# Patient Record
Sex: Female | Born: 1965
Health system: Southern US, Community
[De-identification: ages and names within clinical notes are randomized; demographics above are authoritative.]

## PROBLEM LIST (undated history)

## (undated) DIAGNOSIS — F329 Major depressive disorder, single episode, unspecified: Secondary | ICD-10-CM

## (undated) DIAGNOSIS — Z95810 Presence of automatic (implantable) cardiac defibrillator: Secondary | ICD-10-CM

## (undated) DIAGNOSIS — E669 Obesity, unspecified: Secondary | ICD-10-CM

## (undated) DIAGNOSIS — J302 Other seasonal allergic rhinitis: Secondary | ICD-10-CM

## (undated) DIAGNOSIS — F32A Depression, unspecified: Secondary | ICD-10-CM

## (undated) DIAGNOSIS — E049 Nontoxic goiter, unspecified: Secondary | ICD-10-CM

## (undated) DIAGNOSIS — M545 Low back pain, unspecified: Secondary | ICD-10-CM

## (undated) DIAGNOSIS — I509 Heart failure, unspecified: Secondary | ICD-10-CM

## (undated) DIAGNOSIS — F419 Anxiety disorder, unspecified: Secondary | ICD-10-CM

## (undated) DIAGNOSIS — R0602 Shortness of breath: Secondary | ICD-10-CM

## (undated) DIAGNOSIS — K219 Gastro-esophageal reflux disease without esophagitis: Secondary | ICD-10-CM

## (undated) DIAGNOSIS — K589 Irritable bowel syndrome without diarrhea: Secondary | ICD-10-CM

## (undated) DIAGNOSIS — G4730 Sleep apnea, unspecified: Secondary | ICD-10-CM

## (undated) DIAGNOSIS — I10 Essential (primary) hypertension: Secondary | ICD-10-CM

## (undated) DIAGNOSIS — R011 Cardiac murmur, unspecified: Secondary | ICD-10-CM

## (undated) DIAGNOSIS — A6009 Herpesviral infection of other urogenital tract: Secondary | ICD-10-CM

## (undated) HISTORY — PX: DILATION AND CURETTAGE OF UTERUS: SHX78

## (undated) HISTORY — PX: DIAGNOSTIC LAPAROSCOPY: SUR761

## (undated) HISTORY — PX: WISDOM TOOTH EXTRACTION: SHX21

## (undated) HISTORY — PX: CARDIAC CATHETERIZATION: SHX172

## (undated) HISTORY — PX: NOVASURE ABLATION: SHX5394

## (undated) HISTORY — DX: Herpesviral infection of other urogenital tract: A60.09

## (undated) HISTORY — PX: OTHER SURGICAL HISTORY: SHX169

## (undated) HISTORY — PX: TUBAL LIGATION: SHX77

## (undated) HISTORY — PX: ABDOMINAL HYSTERECTOMY: SHX81

---

## 2001-09-23 ENCOUNTER — Encounter: Payer: Self-pay | Admitting: Emergency Medicine

## 2001-09-23 ENCOUNTER — Emergency Department (HOSPITAL_COMMUNITY): Admission: EM | Admit: 2001-09-23 | Discharge: 2001-09-24 | Payer: Self-pay | Admitting: Emergency Medicine

## 2001-09-24 ENCOUNTER — Encounter: Payer: Self-pay | Admitting: Emergency Medicine

## 2001-10-08 ENCOUNTER — Emergency Department (HOSPITAL_COMMUNITY): Admission: EM | Admit: 2001-10-08 | Discharge: 2001-10-08 | Payer: Self-pay | Admitting: Emergency Medicine

## 2001-12-26 ENCOUNTER — Other Ambulatory Visit: Admission: RE | Admit: 2001-12-26 | Discharge: 2001-12-26 | Payer: Self-pay | Admitting: Family Medicine

## 2002-01-01 ENCOUNTER — Ambulatory Visit (HOSPITAL_COMMUNITY): Admission: RE | Admit: 2002-01-01 | Discharge: 2002-01-01 | Payer: Self-pay | Admitting: Family Medicine

## 2002-07-24 HISTORY — PX: OTHER SURGICAL HISTORY: SHX169

## 2002-09-27 ENCOUNTER — Encounter: Payer: Self-pay | Admitting: Emergency Medicine

## 2002-09-27 ENCOUNTER — Emergency Department (HOSPITAL_COMMUNITY): Admission: EM | Admit: 2002-09-27 | Discharge: 2002-09-27 | Payer: Self-pay | Admitting: Emergency Medicine

## 2002-09-27 ENCOUNTER — Inpatient Hospital Stay (HOSPITAL_COMMUNITY): Admission: AD | Admit: 2002-09-27 | Discharge: 2002-09-28 | Payer: Self-pay | Admitting: Neurology

## 2002-09-28 ENCOUNTER — Encounter: Payer: Self-pay | Admitting: Neurology

## 2003-08-26 ENCOUNTER — Emergency Department (HOSPITAL_COMMUNITY): Admission: AD | Admit: 2003-08-26 | Discharge: 2003-08-26 | Payer: Self-pay | Admitting: Family Medicine

## 2003-10-08 ENCOUNTER — Other Ambulatory Visit: Admission: RE | Admit: 2003-10-08 | Discharge: 2003-10-08 | Payer: Self-pay | Admitting: Family Medicine

## 2004-03-30 ENCOUNTER — Ambulatory Visit: Payer: Self-pay | Admitting: Nurse Practitioner

## 2004-04-01 ENCOUNTER — Ambulatory Visit (HOSPITAL_COMMUNITY): Admission: RE | Admit: 2004-04-01 | Discharge: 2004-04-01 | Payer: Self-pay | Admitting: Internal Medicine

## 2004-04-12 ENCOUNTER — Emergency Department (HOSPITAL_COMMUNITY): Admission: EM | Admit: 2004-04-12 | Discharge: 2004-04-12 | Payer: Self-pay | Admitting: Emergency Medicine

## 2004-11-23 ENCOUNTER — Emergency Department (HOSPITAL_COMMUNITY): Admission: EM | Admit: 2004-11-23 | Discharge: 2004-11-23 | Payer: Self-pay | Admitting: Emergency Medicine

## 2005-09-13 ENCOUNTER — Ambulatory Visit: Payer: Self-pay | Admitting: Nurse Practitioner

## 2005-10-06 ENCOUNTER — Other Ambulatory Visit: Admission: RE | Admit: 2005-10-06 | Discharge: 2005-10-06 | Payer: Self-pay | Admitting: Obstetrics and Gynecology

## 2005-10-31 ENCOUNTER — Encounter (INDEPENDENT_AMBULATORY_CARE_PROVIDER_SITE_OTHER): Payer: Self-pay | Admitting: *Deleted

## 2005-10-31 ENCOUNTER — Ambulatory Visit (HOSPITAL_COMMUNITY): Admission: RE | Admit: 2005-10-31 | Discharge: 2005-10-31 | Payer: Self-pay | Admitting: Obstetrics and Gynecology

## 2005-11-26 ENCOUNTER — Encounter: Admission: RE | Admit: 2005-11-26 | Discharge: 2005-11-26 | Payer: Self-pay | Admitting: Internal Medicine

## 2005-12-11 ENCOUNTER — Encounter: Admission: RE | Admit: 2005-12-11 | Discharge: 2006-03-11 | Payer: Self-pay | Admitting: Obstetrics and Gynecology

## 2006-10-17 ENCOUNTER — Inpatient Hospital Stay (HOSPITAL_COMMUNITY): Admission: AD | Admit: 2006-10-17 | Discharge: 2006-10-18 | Payer: Self-pay | Admitting: Obstetrics and Gynecology

## 2006-11-19 ENCOUNTER — Emergency Department (HOSPITAL_COMMUNITY): Admission: EM | Admit: 2006-11-19 | Discharge: 2006-11-19 | Payer: Self-pay | Admitting: Family Medicine

## 2006-11-27 ENCOUNTER — Ambulatory Visit (HOSPITAL_COMMUNITY): Admission: RE | Admit: 2006-11-27 | Discharge: 2006-11-27 | Payer: Self-pay | Admitting: Gastroenterology

## 2007-01-08 ENCOUNTER — Ambulatory Visit (HOSPITAL_COMMUNITY): Admission: RE | Admit: 2007-01-08 | Discharge: 2007-01-08 | Payer: Self-pay | Admitting: Obstetrics and Gynecology

## 2007-01-08 ENCOUNTER — Encounter (INDEPENDENT_AMBULATORY_CARE_PROVIDER_SITE_OTHER): Payer: Self-pay | Admitting: Obstetrics and Gynecology

## 2007-01-30 ENCOUNTER — Emergency Department (HOSPITAL_COMMUNITY): Admission: EM | Admit: 2007-01-30 | Discharge: 2007-01-31 | Payer: Self-pay | Admitting: Emergency Medicine

## 2008-03-02 ENCOUNTER — Encounter: Admission: RE | Admit: 2008-03-02 | Discharge: 2008-03-02 | Payer: Self-pay | Admitting: Internal Medicine

## 2008-03-10 ENCOUNTER — Ambulatory Visit (HOSPITAL_COMMUNITY): Admission: RE | Admit: 2008-03-10 | Discharge: 2008-03-10 | Payer: Self-pay | Admitting: Obstetrics and Gynecology

## 2008-03-25 ENCOUNTER — Encounter: Payer: Self-pay | Admitting: Internal Medicine

## 2008-09-02 ENCOUNTER — Encounter: Admission: RE | Admit: 2008-09-02 | Discharge: 2008-09-02 | Payer: Self-pay | Admitting: Internal Medicine

## 2009-08-02 ENCOUNTER — Ambulatory Visit (HOSPITAL_COMMUNITY): Admission: RE | Admit: 2009-08-02 | Discharge: 2009-08-02 | Payer: Self-pay | Admitting: Internal Medicine

## 2009-11-29 ENCOUNTER — Ambulatory Visit (HOSPITAL_COMMUNITY): Admission: RE | Admit: 2009-11-29 | Discharge: 2009-11-29 | Payer: Self-pay | Admitting: Obstetrics and Gynecology

## 2009-12-16 ENCOUNTER — Ambulatory Visit (HOSPITAL_COMMUNITY): Admission: RE | Admit: 2009-12-16 | Discharge: 2009-12-16 | Payer: Self-pay | Admitting: Internal Medicine

## 2010-01-31 ENCOUNTER — Encounter: Admission: RE | Admit: 2010-01-31 | Discharge: 2010-04-22 | Payer: Self-pay | Admitting: Obstetrics and Gynecology

## 2010-02-01 ENCOUNTER — Observation Stay (HOSPITAL_COMMUNITY): Admission: EM | Admit: 2010-02-01 | Discharge: 2010-02-03 | Payer: Self-pay | Admitting: Internal Medicine

## 2010-02-01 ENCOUNTER — Encounter: Payer: Self-pay | Admitting: Emergency Medicine

## 2010-04-19 ENCOUNTER — Ambulatory Visit (HOSPITAL_BASED_OUTPATIENT_CLINIC_OR_DEPARTMENT_OTHER): Admission: RE | Admit: 2010-04-19 | Discharge: 2010-04-19 | Payer: Self-pay | Admitting: Obstetrics and Gynecology

## 2010-04-19 ENCOUNTER — Ambulatory Visit: Payer: Self-pay | Admitting: Diagnostic Radiology

## 2010-07-28 ENCOUNTER — Emergency Department (HOSPITAL_COMMUNITY)
Admission: EM | Admit: 2010-07-28 | Discharge: 2010-07-28 | Payer: Self-pay | Source: Home / Self Care | Admitting: Emergency Medicine

## 2010-08-14 ENCOUNTER — Encounter: Payer: Self-pay | Admitting: Obstetrics and Gynecology

## 2010-10-09 LAB — PROTIME-INR: INR: 1.04 (ref 0.00–1.49)

## 2010-10-09 LAB — CARDIAC PANEL(CRET KIN+CKTOT+MB+TROPI)
CK, MB: 1.6 ng/mL (ref 0.3–4.0)
CK, MB: 1.7 ng/mL (ref 0.3–4.0)
Relative Index: 1.7 (ref 0.0–2.5)
Total CK: 97 U/L (ref 7–177)
Troponin I: 0.02 ng/mL (ref 0.00–0.06)

## 2010-10-09 LAB — CBC
HCT: 38.3 % (ref 36.0–46.0)
Hemoglobin: 13.5 g/dL (ref 12.0–15.0)
MCH: 28.6 pg (ref 26.0–34.0)
MCH: 28.7 pg (ref 26.0–34.0)
MCHC: 34.2 g/dL (ref 30.0–36.0)
MCV: 83.5 fL (ref 78.0–100.0)
RBC: 4.48 MIL/uL (ref 3.87–5.11)
RDW: 12.6 % (ref 11.5–15.5)

## 2010-10-09 LAB — DIFFERENTIAL
Basophils Relative: 1 % (ref 0–1)
Lymphocytes Relative: 46 % (ref 12–46)
Lymphs Abs: 3.9 10*3/uL (ref 0.7–4.0)
Monocytes Relative: 5 % (ref 3–12)
Neutrophils Relative %: 48 % (ref 43–77)

## 2010-10-09 LAB — COMPREHENSIVE METABOLIC PANEL
ALT: 16 U/L (ref 0–35)
Alkaline Phosphatase: 78 U/L (ref 39–117)
CO2: 31 mEq/L (ref 19–32)
Chloride: 102 mEq/L (ref 96–112)
GFR calc Af Amer: >60 mL/min (ref >60)
Total Bilirubin: 0.6 mg/dL (ref 0.3–1.2)
Total Protein: 6.3 g/dL (ref 6.0–8.3)

## 2010-10-09 LAB — SEDIMENTATION RATE: Sed Rate: 5 mm/hr (ref 0–22)

## 2010-10-09 LAB — POCT CARDIAC MARKERS
Myoglobin, poc: 62 ng/mL (ref 12–200)
Troponin i, poc: <0.05 ng/mL (ref 0.00–0.09)

## 2010-10-09 LAB — BASIC METABOLIC PANEL
BUN: 12 mg/dL (ref 6–23)
Calcium: 10.1 mg/dL (ref 8.4–10.5)
Creatinine, Ser: 0.49 mg/dL (ref 0.4–1.2)
Sodium: 136 mEq/L (ref 135–145)

## 2010-10-09 LAB — BRAIN NATRIURETIC PEPTIDE: Pro B Natriuretic peptide (BNP): 86 pg/mL (ref 0.0–100.0)

## 2010-11-08 ENCOUNTER — Other Ambulatory Visit: Payer: Self-pay | Admitting: Obstetrics and Gynecology

## 2010-11-08 DIAGNOSIS — Z1231 Encounter for screening mammogram for malignant neoplasm of breast: Secondary | ICD-10-CM

## 2010-12-01 ENCOUNTER — Ambulatory Visit (HOSPITAL_COMMUNITY)
Admission: RE | Admit: 2010-12-01 | Discharge: 2010-12-01 | Disposition: A | Discharge status: Home / Self Care | Payer: 59 | Source: Ambulatory Visit | Attending: Obstetrics and Gynecology | Admitting: Obstetrics and Gynecology

## 2010-12-01 DIAGNOSIS — Z1231 Encounter for screening mammogram for malignant neoplasm of breast: Secondary | ICD-10-CM | POA: Insufficient documentation

## 2010-12-06 NOTE — Op Note (Signed)
NAME:  Susan Peck, BEDNARZ              ACCOUNT NO.:  [id number]   MEDICAL RECORD NO.:  [id number]          PATIENT TYPE:  AMB   LOCATION:  SDC                           FACILITY:  [facility]   PHYSICIAN:  Osborn Coho, M.D.   DATE OF BIRTH:  08-Oct-1965   DATE OF PROCEDURE:  01/08/2007  DATE OF DISCHARGE:                               OPERATIVE REPORT   PREOPERATIVE DIAGNOSES:  1. Pelvic pain.  2. Fibroids.  3. Questionable cervical stenosis.  4. Status post endometrial ablation.  5. Metorrhagia.  6. Dysmenorrhea.   POSTOPERATIVE DIAGNOSES:  1. Pelvic pain.  2. Fibroids.  3. Questionable cervical stenosis.  4. Status post endometrial ablation.  5. Metorrhagia.  6. Dysmenorrhea.   PROCEDURE:  1. Hysteroscopy.  2. Dilatation and curettage.   SURGEON:  Osborn Coho, M.D.   ANESTHESIA:  General via LMA.   FLUIDS:  1100 cc.   URINE OUTPUT:  Approximately 500 cc via straight cath prior to  procedure.  A hysteroscopic fluid deficit of sorbitol 100 cc.   COMPLICATIONS:  None.   ESTIMATED BLOOD LOSS:  Minimal.   FINDINGS:  Senechia in cavity that had the appearance of a uterine  septum near the top of the fundus.  Neither os was able to be  identified.   PROCEDURE:  Patient was taken to the operating room after the risks,  benefits and alternatives were reviewed with patient.  Patient  verbalized understanding, and consent signed and witnessed.  The patient  was placed under general anesthesia and prepped and draped in a normal  sterile fashion.  A bivalve speculum was placed into the patient's  vagina, and the anterior lip of the cervix was grasped with a single-  tooth tenaculum after a paracervical block was administered using a  total of 10 cc of 1% lidocaine.  The internal os was noted to be  stenotic, which was dilated.  The uterus sounded to 7 cm.  The  hysteroscope was introduced, and findings as noted above.  Curretage was  performed, and no real curettings obtained;  however, whatever was  obtained was sent to pathology.  The uterus sounded to approximately 8  cm at the end of the case.  Tenaculum was removed, and there was good  hemostasis at the tenaculum site.  Sponge, lap, and needle count was  correct.  The patient tolerated the procedure well and is awaiting  transfer to the recovery room in good condition.      Osborn Coho, M.D.  Electronically Signed     AR/MEDQ  D:  01/08/2007  T:  01/08/2007  Job:  [id number]

## 2010-12-09 NOTE — H&P (Signed)
NAME:  Susan Peck, Susan Peck                         ACCOUNT NO.:  [id number]   MEDICAL RECORD NO.:  [id number]                   PATIENT TYPE:  INP   LOCATION:  3018                                 FACILITY:  [facility]   PHYSICIAN:  Santina Evans A. Orlin Hilding, M.D.          DATE OF BIRTH:  May 07, 1966   DATE OF ADMISSION:  09/27/2002  DATE OF DISCHARGE:                                HISTORY & PHYSICAL   CHIEF COMPLAINT:  Left-sided numbness.   HISTORY OF PRESENT ILLNESS:  The patient is a 45 year old right-handed black  woman with hypertension and asthma who complains of daily headache for some  time, 2 days of lightheadedness and blurry vision, went to her primary  physician which is [facility] yesterday, noted that her diastolic blood  pressure was elevated but no adjustments were made.  This morning she was  just staying in bed because she felt bad and around 11 o'clock said she had  the sudden onset of left arm and leg numbness, not face.  She also had some  mild chest pain which is gone now.  She did not have any weakness.  When she  got up, she was lightheaded but was able to walk.   REVIEW OF SYSTEMS:  Negative shortness of breath; she does have some mild  gastrointestinal cramping; no speech or language problems; she had mild  chest pain earlier which is gone at present.   PAST MEDICAL HISTORY:  Hypertension, obesity, mild asthma, tubal ligation,  noncardiac chest pain with a negative catheterization 2 years ago.   MEDICATIONS:  Lisinopril, hydrochlorothiazide 20/12.5 b.i.d., albuterol  inhaler, Flovent p.r.n.  She has not been taking aspirin but has no known  allergy or contraindication, apparently her asthma is not aspirin induced.   ALLERGIES:  No known drug allergies.   SOCIAL HISTORY:  Single; three children; unemployed; no cigarette use;  denies drugs or alcohol.   FAMILY HISTORY:  CHF, hypertension, stroke, and diabetes.   PHYSICAL EXAMINATION:  VITAL SIGNS:  Blood  pressure ranges 110-141  systolic/61-95 diastolic, pulse is 72, respirations 20, 97% saturation.  HEAD:  Normocephalic, atraumatic.  NECK:  Supple without bruits.  HEART:  Regular rate and rhythm.  LUNGS:  Clear to auscultation.  EXTREMITIES:  Without edema.  GENERAL:  She is obese; she is no acute distress.  NEUROLOGICAL:  Mental status - She is awake, alert, and appropriate with  normal fluent and spontaneous language; fully oriented.  Cranial nerve II-  XII - Pupils are equal and reactive.  Disks margins are sharp.  Disks are  somewhat pale but symmetric.  Visual fields are full to confrontation.  Extraocular movements are intact without nystagmus, ophthalmoparesis, or  ptosis.  Facial sensation is normal bilaterally.  Facial motor activity  intact without weakness, droop, or asymmetry.  Hearing is intact.  Palate is  symmetric and tongue is midline.  She has normal shoulder shrug.  On motor  exam  she has normal station and gait.  Normal bulk, tone, and strength  throughout with 5/5 strength in all four extremities.  No drift.  No  __________ .  Normal rapid fine movement.  Reflexes are 1+ and symmetric.  I  could not really get her elbows very well but she had a lot of hardware on  her.  Downgoing toes to plantar stimulation.  On coordination finger-to-  nose, rapid alternating movement, heel-to-shin are normal.  Sensory exam is  intact on the right and on the left she describes decreased but not absent  sensation in the arm and leg diffusely on the left.   DIAGNOSTIC STUDIES/LABORATORY DATA:  CT scan of the brain is normal.  EKG  shows normal sinus rhythm.  CBC is normal with a white blood cell count of  6.8, hemoglobin 13.3, hematocrit 38.3, platelets 294.  Sodium 135, potassium  3.6, chloride 105, CO2 29, BUN 12, creatinine 0.6, glucose 99, calcium 9.5,  SGOT 22, SGPT 20, alk phos 95, total bili 0.5, protein 7.4, albumin 3.9, PT  12.6, INR 0.9, PTT 42.   IMPRESSION:  Left  hemisensory loss sudden onset in setting of hypertension,  need to consider right brain stroke though she is young and her exam is  somewhat unimpressive.   PLAN:  Admit to [facility].  IV fluids and oxygen.  Check MRI of  the brain, MR angiogram, 2-D echo, carotid Doppler.  We will start her on  aspirin with caution as she does have a history of asthma.                                               Catherine A. Orlin Hilding, M.D.    CAW/MEDQ  D:  09/27/2002  T:  09/28/2002  Job:  [id number]

## 2010-12-09 NOTE — Op Note (Signed)
NAME:  Susan Peck, Susan Peck               ACCOUNT NO.:  [id number]   MEDICAL RECORD NO.:  [id number]          PATIENT TYPE:  AMB   LOCATION:  SDC                           [facility]ILITY:  [facility]   PHYSICIAN:  Osborn Coho, M.D.   DATE OF BIRTH:  10-24-1965   DATE OF PROCEDURE:  10/31/2005  DATE OF DISCHARGE:                                 OPERATIVE REPORT   PREOPERATIVE DIAGNOSIS:  1.  Menorrhagia.  2.  Dysfunctional uterine bleeding.  3.  Dysmenorrhea.   POSTOPERATIVE DIAGNOSIS:  1.  Menorrhagia.  2.  Dysfunctional uterine bleeding.  3.  Dysmenorrhea.   PROCEDURE:  1.  Hysteroscopy.  2.  Dilation and curettage.  3.  Endometrial ablation via NovaSure.   SURGEON:  Osborn Coho, M.D.   FLUIDS:  900 mL.   HYSTEROSCOPIC FLUID DEFICIT:  LR 130 mL with some on the floor.   URINE OUTPUT:  Less than 100 mL via straight cath prior to procedure   ESTIMATED BLOOD LOSS:  Minimal.   COMPLICATIONS:  None.   SPECIMEN:  Endometrial curettings.   PROCEDURE:  The patient is taken to the operating room after the risks,  benefits, and alternatives were reviewed with the patient.  The patient  verbalized understanding and consent signed and witnessed.  The patient was  placed under general anesthesia and prepped and draped in a normal sterile  fashion in the dorsal lithotomy position.  A bivalve speculum was placed in  the patient's vagina and a paracervical block administered using a total of  10 mL of 1% lidocaine.  A single-tooth tenaculum was placed on the anterior  lip of the cervix and the cervix measured 4 cm. The uterus sounded to 10 cm  and the cervix dilated for passage of the diagnostic hysteroscope.  The  diagnostic hysteroscope was introduced and no intracavitary lesions noted.  Curettage performed and curettings sent to  pathology.  NovaSure ablation performed with a cavity width of 4.5 cm with a  total wattage of 149 and time of ablation 1 minute 31 seconds.  Count was  correct.   All instruments were removed.  There was hemostasis at the  tenaculum site.  The patient tolerated the procedure well and is currently  awaiting transfer to the recovery room.      Osborn Coho, M.D.  Electronically Signed     AR/MEDQ  D:  10/31/2005  T:  10/31/2005  Job:  [id number]

## 2011-01-31 ENCOUNTER — Other Ambulatory Visit: Payer: Self-pay

## 2011-01-31 ENCOUNTER — Other Ambulatory Visit (HOSPITAL_COMMUNITY)
Admission: RE | Admit: 2011-01-31 | Discharge: 2011-01-31 | Disposition: A | Discharge status: Home / Self Care | Payer: 59 | Source: Ambulatory Visit | Attending: Obstetrics and Gynecology | Admitting: Obstetrics and Gynecology

## 2011-01-31 DIAGNOSIS — Z1159 Encounter for screening for other viral diseases: Secondary | ICD-10-CM | POA: Insufficient documentation

## 2011-01-31 DIAGNOSIS — Z124 Encounter for screening for malignant neoplasm of cervix: Secondary | ICD-10-CM | POA: Insufficient documentation

## 2011-02-01 ENCOUNTER — Other Ambulatory Visit: Payer: Self-pay | Admitting: Obstetrics and Gynecology

## 2011-02-01 DIAGNOSIS — Z1231 Encounter for screening mammogram for malignant neoplasm of breast: Secondary | ICD-10-CM

## 2011-04-01 ENCOUNTER — Emergency Department (HOSPITAL_COMMUNITY): Payer: 59

## 2011-04-01 ENCOUNTER — Inpatient Hospital Stay (HOSPITAL_COMMUNITY)
Admission: EM | Admit: 2011-04-01 | Discharge: 2011-04-03 | DRG: 293 | Disposition: A | Discharge status: Home / Self Care | Payer: 59 | Attending: Internal Medicine | Admitting: Internal Medicine

## 2011-04-01 DIAGNOSIS — I5023 Acute on chronic systolic (congestive) heart failure: Principal | ICD-10-CM | POA: Diagnosis present

## 2011-04-01 DIAGNOSIS — J45909 Unspecified asthma, uncomplicated: Secondary | ICD-10-CM | POA: Diagnosis present

## 2011-04-01 DIAGNOSIS — E669 Obesity, unspecified: Secondary | ICD-10-CM | POA: Diagnosis present

## 2011-04-01 DIAGNOSIS — K219 Gastro-esophageal reflux disease without esophagitis: Secondary | ICD-10-CM | POA: Diagnosis present

## 2011-04-01 DIAGNOSIS — Z7982 Long term (current) use of aspirin: Secondary | ICD-10-CM

## 2011-04-01 DIAGNOSIS — I509 Heart failure, unspecified: Secondary | ICD-10-CM | POA: Diagnosis present

## 2011-04-01 DIAGNOSIS — I10 Essential (primary) hypertension: Secondary | ICD-10-CM | POA: Diagnosis present

## 2011-04-01 LAB — DIFFERENTIAL
Basophils Absolute: 0 10*3/uL (ref 0.0–0.1)
Basophils Relative: 0 % (ref 0–1)
Eosinophils Absolute: 0.1 10*3/uL (ref 0.0–0.7)
Monocytes Absolute: 0.5 10*3/uL (ref 0.1–1.0)
Neutro Abs: 4.5 10*3/uL (ref 1.7–7.7)
Neutrophils Relative %: 48 % (ref 43–77)

## 2011-04-01 LAB — POCT I-STAT, CHEM 8
BUN: 13 mg/dL (ref 6–23)
Creatinine, Ser: 0.7 mg/dL (ref 0.50–1.10)
Potassium: 4 mEq/L (ref 3.5–5.1)
Sodium: 139 mEq/L (ref 135–145)
TCO2: 25 mmol/L (ref 0–100)

## 2011-04-01 LAB — CBC
Hemoglobin: 14.2 g/dL (ref 12.0–15.0)
MCHC: 35.4 g/dL (ref 30.0–36.0)
Platelets: 288 10*3/uL (ref 150–400)

## 2011-04-01 LAB — POCT I-STAT TROPONIN I: Troponin i, poc: 0 ng/mL (ref 0.00–0.08)

## 2011-04-02 LAB — BASIC METABOLIC PANEL
BUN: 14 mg/dL (ref 6–23)
Calcium: 9.3 mg/dL (ref 8.4–10.5)
Creatinine, Ser: 0.51 mg/dL (ref 0.50–1.10)
GFR calc non Af Amer: >60 mL/min (ref >60)
Glucose, Bld: 153 mg/dL — ABNORMAL HIGH (ref 70–99)
Sodium: 136 mEq/L (ref 135–145)

## 2011-04-03 LAB — BASIC METABOLIC PANEL
BUN: 10 mg/dL (ref 6–23)
CO2: 29 mEq/L (ref 19–32)
Calcium: 9.3 mg/dL (ref 8.4–10.5)
Creatinine, Ser: 0.59 mg/dL (ref 0.50–1.10)
GFR calc non Af Amer: >60 mL/min (ref >60)
Glucose, Bld: 187 mg/dL — ABNORMAL HIGH (ref 70–99)

## 2011-04-10 ENCOUNTER — Ambulatory Visit (HOSPITAL_COMMUNITY)
Admission: RE | Admit: 2011-04-10 | Discharge: 2011-04-10 | Disposition: A | Discharge status: Home / Self Care | Payer: 59 | Source: Ambulatory Visit | Attending: Internal Medicine | Admitting: Internal Medicine

## 2011-04-10 ENCOUNTER — Encounter: Payer: Self-pay | Admitting: *Deleted

## 2011-04-10 ENCOUNTER — Encounter (HOSPITAL_COMMUNITY): Payer: Self-pay | Admitting: Internal Medicine

## 2011-04-10 VITALS — BP 132/88 | HR 70 | Resp 18 | Wt 212.0 lb

## 2011-04-10 DIAGNOSIS — I5022 Chronic systolic (congestive) heart failure: Secondary | ICD-10-CM | POA: Insufficient documentation

## 2011-04-10 HISTORY — DX: Gastro-esophageal reflux disease without esophagitis: K21.9

## 2011-04-10 HISTORY — DX: Obesity, unspecified: E66.9

## 2011-04-10 HISTORY — DX: Essential (primary) hypertension: I10

## 2011-04-10 LAB — BASIC METABOLIC PANEL
BUN: 12 mg/dL (ref 6–23)
Creatinine, Ser: 0.52 mg/dL (ref 0.50–1.10)
GFR calc Af Amer: >60 mL/min (ref >60)
GFR calc non Af Amer: >60 mL/min (ref >60)
Glucose, Bld: 122 mg/dL — ABNORMAL HIGH (ref 70–99)

## 2011-04-10 MED ORDER — CARVEDILOL 12.5 MG PO TABS: 12.5000 mg | ORAL_TABLET | Freq: Two times a day (BID) | ORAL | Status: DC

## 2011-04-10 MED ORDER — SPIRONOLACTONE 25 MG PO TABS: 12.5000 mg | ORAL_TABLET | Freq: Every day | ORAL | Status: DC

## 2011-04-10 NOTE — Assessment & Plan Note (Addendum)
NYHA II-III. Volume status remains mildly elevated. Lengthy discussion about medication compliance and obtaining medications from [facility] or [facility] on their $4 plan. Discussed limiting salt intake to 2 grams per day. She is instructed to weigh daily and record . If weight increases 3 pounds in 24 hours she will take one extra Lasix .  Start 12.5 mg Spironolactone daily. Change Metoprolol XR to Carvediolol 12.5mg   BID for generic cost. Discussed possible to defibrillator if EF remains low.  Check BMET. Will refer to pulmonologist for sleep study. Repeat ECHO 3-4 months.  Follow up 2 weeks for further med titration.   Patient seen and examined with Tonye Becket, NP. We discussed all aspects of the encounter. I agree with the assessment and plan as stated above.

## 2011-04-10 NOTE — Patient Instructions (Signed)
Stop Metoprolol Start Carvedilol 12.5 mg Twice daily  Start Spironolactone 25 mg 1/2 tab daily  Lab today  Your physician recommends that you schedule a follow-up appointment in: 2 weeks

## 2011-04-10 NOTE — Progress Notes (Signed)
HPI:  Susan Peck is a 45 year old African American femalae with systolic heart failure, HTN, GERD and asthma. Referred for post-hospital f/u in HF clinic.   2004 cardiac cath by Dr Campbell Lerner in [place], [place] with one blockage noted - no stent. EF said to be normal. About 1 year ago at [facility] noted murmur and she was referred to Dr Katrinka Blazing EF 25-30%. Dr Katrinka Blazing changed her over to Metoprolol.  She has not followed up with Dr Katrinka Blazing due to no payment.   Recently admitted due to acute HF after not taking her meds.BNP was found to be mildly elevated in the 500s.  She was admitted and started on IV Lasix.  She diuresed well.  After she started diuresing  well, her weight dropped approximately 3 pounds.   She is able to ambulate well on room air and her BNP  on day of discharge was down to 150. 04/03/2011  ECHO 25-30% EF which is decreased from 35-40% noted in 2012. She is referred by Dr Rito Ehrlich post hospitalization f/u.   Now taking all meds.  Feels better on medications. She still complains of fatgue. Weight at home has been 208-210.Mild edema and ab bloating. Sleeps on 3 pillows. Snores a lot. Trying to follow low salt diet.  BP improved control.    ROS: All other systems normal except as mentioned in HPI, past medical history and problem list.    Past Medical History  Diagnosis Date  . Systolic heart failure     May 2011 EF 35-40%, 04/03/11 EF 25-30%  . Hypertension   . Asthma   . Obesity   . GERD (gastroesophageal reflux disease)     Current Outpatient Prescriptions  Medication Sig Dispense Refill  . ALBUTEROL SULFATE IN Inhale 2 puffs into the lungs 2 (two) times daily as needed.        Marland Kitchen aspirin 81 MG tablet Take 81 mg by mouth daily.        Marland Kitchen dexlansoprazole (DEXILANT) 60 MG capsule Take 60 mg by mouth daily.        . furosemide (LASIX) 40 MG tablet Take 40 mg by mouth daily.        Marland Kitchen lisinopril (PRINIVIL,ZESTRIL) 20 MG tablet Take 20 mg by mouth daily.        . metoprolol (TOPROL-XL) 100 MG  24 hr tablet Take 100 mg by mouth at bedtime.        Bertram Gala Glycol-Propyl Glycol (SYSTANE) 0.4-0.3 % SOLN Apply 1 drop to eye daily as needed.           Allergies no known allergies  History   Social History  . Marital Status: Married    Spouse Name: N/A    Number of Children: N/A  . Years of Education: N/A   Occupational History  .  [employer]   Social History Main Topics  . Smoking status: Not on file  . Smokeless tobacco: Not on file  . Alcohol Use: No  . Drug Use: No  . Sexually Active: Not on file   Other Topics Concern  . Not on file   Social History Narrative   She lives with her husband and son.  She is works for [employer].    Family History  Problem Relation Age of Onset  . Heart disease    . Cancer    . Hypertension      PHYSICAL EXAM: Filed Vitals:   04/10/11 1212  BP: 132/88  Pulse: 70  Resp: 18   General:  Well appearing. No respiratory difficulty HEENT: normal Neck: supple. JVP 7-8. Carotids 2+ bilat; no bruits. No lymphadenopathy or thryomegaly appreciated. Cor: PMI nondisplaced. Regular rate & rhythm. No rubs or murmurs. +s4 Lungs: clear Abdomen: soft, nontender, obese, nondistended. No hepatosplenomegaly. No bruits or masses. Good bowel sounds. Extremities: no cyanosis, clubbing, rash. tr edema Neuro: alert & oriented x 3, cranial nerves grossly intact. moves all 4 extremities w/o difficulty. Affect pleasant.    ASSESSMENT & PLAN:

## 2011-04-17 ENCOUNTER — Telehealth (HOSPITAL_COMMUNITY): Payer: Self-pay | Admitting: *Deleted

## 2011-04-17 NOTE — Telephone Encounter (Signed)
Pt called this am concerned about her blood work from last week.  She also had other questions regarding her directions from her appt last week.

## 2011-04-17 NOTE — Telephone Encounter (Signed)
Pt given lab results, she states she needs to reschedule her sleep study b/c she was told by the girl doing her lab work not to go until she hears from lab results, so she didn't go on the 19th as scheduled will have Dawn call her tomorrow to reschedule

## 2011-04-18 NOTE — Progress Notes (Signed)
Encounter addended by: Noralee Space, RN on: 04/18/2011  9:05 AM<BR>     Documentation filed: Orders

## 2011-04-30 NOTE — Discharge Summary (Signed)
  NAMEKALILA, ADKISON              ACCOUNT NO.:  [id number]  MEDICAL RECORD NO.:  [id number]  LOCATION:  1444                         FACILITY:  [facility]  PHYSICIAN:  Hollice Espy, M.D.DATE OF BIRTH:  10-10-1965  DATE OF ADMISSION:  04/01/2011 DATE OF DISCHARGE:  04/03/2011                              DISCHARGE SUMMARY   ANTICIPATED DISCHARGE OF DISCHARGE:  April 03, 2011.  ATTENDING PHYSICIAN:  Hollice Espy, M.D.  PRIMARY CARE PHYSICIAN:  Dr. Della Goo.  CARDIOLOGY:  She previously was seen by Dr. Verdis Prime, [facility], but she has since discharged from the practice; currently going to set up an appointment with Lang[place], her cardiologist at [facility].  DISCHARGE DIAGNOSES: 1. Acute systolic congestive heart failure with mild exacerbation. 2. History of gastroesophageal reflux disease. 3. Hypertension. 4. Obesity.  Please note all diagnoses present on admission.  DISCHARGE MEDICATIONS:  As follows: 1. Lisinopril 20 p.o. daily. 2. Metoprolol 100 p.o. q.h.s. 3. Lasix 40 p.o. daily. 4. Dexilant 60 p.o. daily. 5. Albuterol inhaler 2 puffs b.i.d. p.r.n. 6. Systane eye drops over-the-counter 1 drop both eyes daily p.r.n. 7. Aspirin 81 mg p.o. daily.  DISCHARGE DIET:  Heart-healthy diet.  ACTIVITIES:  Slowly increase.  The patient is being educated on daily weights.  DISPOSITION:  Improved.  We are in the process of setting up her appointments for outpatient followup.  We will mend this discharge summary with those appointments.  HOSPITAL COURSE:  The patient is a 45 year old African American female with past medical history of systolic congestive heart failure with an EF of 40%, who presented with complaints of shortness of breath.  Her BNP was found to be mildly elevated in the 500s.  She was admitted, started on IV Lasix.  She diuresed well.  After she started diuresing well, her weight dropped approximately 3 pounds.  She  started breathing more comfortable.  She is able to ambulate well on room air and her BNP on day of discharge was down to 150.  We provided her with CHF education.  We will continue CHF education with RN followup through home health with setting up an appointment with her PCP as well as with Dr. Corinda Gubler, [facility] for followup.  Please note a repeat echo was done, results are pending at this time.     Hollice Espy, M.D.     SKK/MEDQ  D:  04/03/2011  T:  04/03/2011  Job:  [id number]  Electronically Signed by Virginia Rochester M.D. on 04/30/2011 05:32:38 PM

## 2011-05-01 ENCOUNTER — Ambulatory Visit (HOSPITAL_COMMUNITY)
Admission: RE | Admit: 2011-05-01 | Discharge: 2011-05-01 | Disposition: A | Discharge status: Home / Self Care | Payer: 59 | Source: Ambulatory Visit | Attending: Internal Medicine | Admitting: Internal Medicine

## 2011-05-01 ENCOUNTER — Encounter (HOSPITAL_COMMUNITY): Payer: Self-pay

## 2011-05-01 VITALS — BP 116/78 | HR 77 | Wt 213.5 lb

## 2011-05-01 DIAGNOSIS — I5022 Chronic systolic (congestive) heart failure: Secondary | ICD-10-CM

## 2011-05-01 MED ORDER — CARVEDILOL 12.5 MG PO TABS: 18.7500 mg | ORAL_TABLET | Freq: Two times a day (BID) | ORAL | Status: DC

## 2011-05-01 NOTE — Progress Notes (Signed)
HPI:  Susan Peck is a 45 year old African American femalae with systolic heart failure, HTN, GERD and asthma. Referred for post-hospital f/u in HF clinic.   2004 cardiac cath by Dr Campbell Lerner in [place], [place] with one blockage noted - no stent. EF said to be normal. About 1 year ago at [facility] noted murmur and she was referred to Dr Katrinka Blazing EF 25-30%. Dr Katrinka Blazing changed her over to Metoprolol.  She has not followed up with Dr Katrinka Blazing due to no payment.   Recently admitted due to acute HF after not taking her meds.BNP was found to be mildly elevated in the 500s.  She was admitted and started on IV Lasix.  She diuresed well.  After she started diuresing  well, her weight dropped approximately 3 pounds.   She is able to ambulate well on room air and her BNP  on day of discharge was down to 150. 04/03/2011  ECHO 25-30% EF which is decreased from 35-40% noted in 2012. She is referred by Dr Rito Ehrlich post hospitalization f/u.   She is here for follow up.  Complains of bilateral leg pain down lateral aspect of legs.  SOB has improved. Feels better on medications. She still complains of fatgue. Weight at home has been 208-211. She has take one extra Lasix due to increased. She has her legs wrapped at night by her husband. Abdominal bloating.  Sleeps on 2-3 pillows. Snores a lot. Trying to follow low salt diet.  BP improved control.    ROS: All other systems normal except as mentioned in HPI, past medical history and problem list.    Past Medical History  Diagnosis Date  . Systolic heart failure     May 2011 EF 35-40%, 04/03/11 EF 25-30%  . Hypertension   . Asthma   . Obesity   . GERD (gastroesophageal reflux disease)     Current Outpatient Prescriptions  Medication Sig Dispense Refill  . ALBUTEROL SULFATE IN Inhale 2 puffs into the lungs 2 (two) times daily as needed.        Marland Kitchen aspirin 81 MG tablet Take 81 mg by mouth daily.        . carvedilol (COREG) 12.5 MG tablet Take 1 tablet (12.5 mg total) by mouth 2  (two) times daily with a meal.  60 tablet  6  . dexlansoprazole (DEXILANT) 60 MG capsule Take 60 mg by mouth daily.        . furosemide (LASIX) 40 MG tablet Take 40 mg by mouth daily.        Marland Kitchen lisinopril (PRINIVIL,ZESTRIL) 20 MG tablet Take 10 mg by mouth daily.       Bertram Gala Glycol-Propyl Glycol (SYSTANE) 0.4-0.3 % SOLN Apply 1 drop to eye daily as needed.        Marland Kitchen spironolactone (ALDACTONE) 25 MG tablet Take 0.5 tablets (12.5 mg total) by mouth daily.  30 tablet  6     No Known Allergies  History   Social History  . Marital Status: Married    Spouse Name: N/A    Number of Children: N/A  . Years of Education: N/A   Occupational History  .  [employer]   Social History Main Topics  . Smoking status: Never Smoker   . Smokeless tobacco: Not on file  . Alcohol Use: No  . Drug Use: No  . Sexually Active: Not on file   Other Topics Concern  . Not on file   Social History Narrative  She lives with her husband and son.  She is works for [employer].    Family History  Problem Relation Age of Onset  . Heart disease Maternal Aunt   . Cancer Mother   . Hypertension Maternal Grandmother     PHYSICAL EXAM: Filed Vitals:   05/01/11 1556  BP: 116/78  Pulse: 77   General:  Well appearing. No respiratory difficulty HEENT: normal Neck: supple. JVP 5-6. Carotids 2+ bilat; no bruits. No lymphadenopathy or thryomegaly appreciated. Cor: PMI nondisplaced. Regular rate & rhythm. No rubs or murmurs.  Lungs: clear Abdomen: soft, nontender, obese, nondistended. No hepatosplenomegaly. No bruits or masses. Good bowel sounds. Extremities: no cyanosis, clubbing, rash. tr edema Rand L pedal pulse 3+ Neuro: alert & oriented x 3, cranial nerves grossly intact. moves all 4 extremities w/o difficulty. Affect pleasant.    ASSESSMENT & PLAN:

## 2011-05-01 NOTE — Assessment & Plan Note (Addendum)
NYHA II-III. Volume status stable.  Taking all medications.  Will increase Carvediolol 18.75 mg  BID.  We discussed the possible extra fluid related to increase in Carvedilol. Sleep study 05-19-2011.  Repeat ECHO 3 months.  Follow up in three weeks for medication titration..   Patient seen and examined with Tonye Becket, NP. We discussed all aspects of the encounter. I agree with the assessment and plan as stated above.

## 2011-05-01 NOTE — Patient Instructions (Signed)
Increase Carvedilol 18.75 mg  BID  Please continue to weigh and record daily  Follow up in 2-3 weeks.

## 2011-05-09 LAB — BASIC METABOLIC PANEL
BUN: 5 — ABNORMAL LOW
Creatinine, Ser: 0.52
GFR calc non Af Amer: >60
Glucose, Bld: 192 — ABNORMAL HIGH

## 2011-05-09 LAB — URINALYSIS, ROUTINE W REFLEX MICROSCOPIC
Glucose, UA: NEGATIVE
Ketones, ur: NEGATIVE
Nitrite: NEGATIVE
Protein, ur: NEGATIVE

## 2011-05-09 LAB — CBC
HCT: 37.1
MCV: 80.7
Platelets: 306
RDW: 12.8
WBC: 8.1

## 2011-05-09 LAB — URINE MICROSCOPIC-ADD ON

## 2011-05-09 LAB — DIFFERENTIAL
Basophils Absolute: 0.1
Eosinophils Absolute: 0.1
Eosinophils Relative: 1
Lymphocytes Relative: 40
Lymphs Abs: 3.2
Neutrophils Relative %: 51

## 2011-05-09 LAB — WET PREP, GENITAL: Trich, Wet Prep: NONE SEEN

## 2011-05-09 LAB — GC/CHLAMYDIA PROBE AMP, GENITAL: Chlamydia, DNA Probe: NEGATIVE

## 2011-05-09 LAB — RPR: RPR Ser Ql: NONREACTIVE

## 2011-05-09 LAB — LIPASE, BLOOD: Lipase: 19

## 2011-05-09 LAB — HEPATIC FUNCTION PANEL
AST: 27
Albumin: 3.6

## 2011-05-09 LAB — URINE CULTURE: Colony Count: >=100000

## 2011-05-10 LAB — HCG, SERUM, QUALITATIVE: Preg, Serum: NEGATIVE

## 2011-05-10 LAB — CBC
Hemoglobin: 14
MCHC: 34.4
MCV: 81
RBC: 5.03
WBC: 7.7

## 2011-05-10 LAB — BASIC METABOLIC PANEL
CO2: 28
Calcium: 9.3
Chloride: 103
Creatinine, Ser: 0.54
GFR calc Af Amer: >60
Sodium: 136

## 2011-05-10 NOTE — H&P (Signed)
NAME:  Susan Peck, Susan Peck NO.:  [id number]  MEDICAL RECORD NO.:  [id number]  LOCATION:  [facility]LED                         FACILITY:  [facility]  PHYSICIAN:  Calvert Cantor, M.D.     DATE OF BIRTH:  05-31-1966  DATE OF ADMISSION:  04/01/2011 DATE OF DISCHARGE:                             HISTORY & PHYSICAL   REFERRING PHYSICIAN:  Orlene Och, MD.  PRIMARY CARE PHYSICIAN:  Della Goo, M.D.  PRESENTING COMPLAINT:  Shortness of breath.  HISTORY OF PRESENT ILLNESS:  45 year old female with a history of chronic systolic heart failure, asthma, hypertension, and obesity.  The patient comes in for shortness of breath, which is present at rest but much worse when she exerts herself.  She says her legs were quite swollen yesterday, but are better today.  She feels like her hands are still swollen.  She is not complaining of any chest pain or pressure. She has had a cough, which is productive of mild amount of mucus.  She has been wheezing.  She did uses her inhaler, but this did not help and therefore she came to the hospital.  PAST MEDICAL HISTORY: 1. Congestive heart failure systolic with EF of 35%-40% per echo in     May 2011. 2. Hypertension. 3. Asthma. 4. Obesity.  SURGICAL HISTORY: 1. D and C 2. Tubal ligation. 3. Diagnostic laparoscopy of the pelvis.  FAMILY HISTORY:  Heart disease, cancer, hypertension.  SOCIAL HISTORY:  Does not smoke or drink.  Lives with her spouse and son.  Works for [employer].  ALLERGIES:  No known drug allergies.  MEDICATIONS: 1. Dexilant 60 mg daily. 2. Systane eyedrops 1 drop daily as needed for dry eyes. 3. Metoprolol XL 100 mg every evening. 4. Lisinopril 20 mg daily. 5. Furosemide 40 mg daily. 6. Albuterol inhaler 2 puffs twice a day. 7. Aspirin 81 mg daily.  REVIEW OF SYSTEMS:  Has had some weight loss.  Has frequent headaches. HEENT: Has some blurred vision.  Has some sinus drainage.  No sore throat, no earache.   RESPIRATORY: Positive for shortness of breath, cough, and wheezing.  CARDIAC: No chest pain, but complains of palpitations on and off and pedal edema.  GI: No nausea, vomiting, diarrhea, or constipation, but she does complain of very bad indigestion and sometimes feels like her stomach is still full that she is unable to eat.  She does not complain of any right upper quadrant pain.  GU: No dysuria, hematuria, incontinence.  HEMATOLOGICALLY:  Bruises easily. SKIN:  No rash.  MUSCULOSKELETAL:  Has back pain, pain across her shoulder blades, joint pain.  NEUROLOGICALLY:  No history of strokes or seizures.  No numbness or tingling.  PSYCHOLOGICALLY:  No current anxiety or depression.  Mood and affect normal.  SKIN:  Warm.  PHYSICAL EXAMINATION:  VITAL SIGNS: Blood pressure 158/102, respiratory rate 18, temperature 98.3, oxygen 98% on 2 L and heart rate 72. HEENT:  Pupils equal, round, reacting to light.  Extraocular movements are intact.  Conjunctivae is pink.  No scleral icterus.  Oral mucosa is moist.  Oropharynx clear. NECK:  Supple.  No thyromegaly, lymphadenopathy, carotid bruits, are regular rate and rhythm.  No murmurs,  rubs, or gallops. LUNGS: Clear bilaterally.  No wheezing or crackles. ABDOMEN: Obese, soft, nontender, nondistended.  Bowel sounds positive. EXTREMITIES: No cyanosis, clubbing, or edema.  Pedal pulses positive. NEUROLOGICALLY:  Cranial nerves II-XII intact.  Strength intact in all 4 extremities. PSYCHOLOGICALLY:  Awake, alert, oriented x3.  Mood and affect normal. SKIN:  Warm, dry.  No rash.  She has a mild bruise in her leg.  LABORATORY DATA:  Blood work:  CBC is normal.  Met panel is normal as well.  BNP is 564.  Chest x-ray reveals cardiomegaly and somewhat prominent interstitial markings question mild interstitial edema.  EKG reveals a left bundle-branch block.  QTC is prolonged at 512 milliseconds.  ASSESSMENT AND PLAN: 1. Dyspnea on exertion may be  related to mild-to-moderate congestive     heart failure.  I am unable to hear any crackles on exam but she     does state that she is very short of breath from just walking from     the bed to the door.  She does have good air entry and no wheezing.     Therefore, I doubt this is an asthma attack.  She has received some     Lasix already, I will give her 14 mg p.o. q.8 h.  There is a     shortage of IV Lasix, so we will hold off on IV Lasix.  We will     monitor I's and O's and daily weights. 2. Hypertension.  We will continue her lisinopril, metoprolol.  She     did not take her lisinopril this morning. 3. Indigestion, reflux, I will add Pepcid to her Dexilant.  I have     advised her to stop eating fatty foods as this may be worsening     things. 4. Asthma, appear stable currently. 5. Morbid obesity. 6. Deep vein thrombosis prophylaxis with Lovenox.  Time on admission 45 minutes.     Calvert Cantor, M.D.     SR/MEDQ  D:  04/01/2011  T:  04/01/2011  Job:  [id number]  cc:   Della Goo, M.D. Fax: [phone]  Electronically Signed by Calvert Cantor M.D. on 05/10/2011 07:14:09 PM

## 2011-05-19 ENCOUNTER — Ambulatory Visit (HOSPITAL_BASED_OUTPATIENT_CLINIC_OR_DEPARTMENT_OTHER): Payer: 59 | Attending: Adult Health

## 2011-05-19 DIAGNOSIS — G4733 Obstructive sleep apnea (adult) (pediatric): Secondary | ICD-10-CM | POA: Insufficient documentation

## 2011-05-23 ENCOUNTER — Encounter (HOSPITAL_COMMUNITY): Payer: 59

## 2011-05-27 DIAGNOSIS — G4733 Obstructive sleep apnea (adult) (pediatric): Secondary | ICD-10-CM

## 2011-05-29 ENCOUNTER — Ambulatory Visit (HOSPITAL_COMMUNITY)
Admission: RE | Admit: 2011-05-29 | Discharge: 2011-05-29 | Disposition: A | Discharge status: Home / Self Care | Payer: 59 | Source: Ambulatory Visit | Attending: Internal Medicine | Admitting: Internal Medicine

## 2011-05-29 DIAGNOSIS — I5022 Chronic systolic (congestive) heart failure: Secondary | ICD-10-CM | POA: Insufficient documentation

## 2011-05-29 DIAGNOSIS — G4733 Obstructive sleep apnea (adult) (pediatric): Secondary | ICD-10-CM | POA: Insufficient documentation

## 2011-05-29 LAB — BASIC METABOLIC PANEL
BUN: 13 mg/dL (ref 6–23)
CO2: 28 mEq/L (ref 19–32)
Calcium: 10 mg/dL (ref 8.4–10.5)
Chloride: 102 mEq/L (ref 96–112)
Creatinine, Ser: 0.71 mg/dL (ref 0.50–1.10)
GFR calc Af Amer: >90 mL/min (ref >90)
GFR calc non Af Amer: >90 mL/min (ref >90)
Glucose, Bld: 127 mg/dL — ABNORMAL HIGH (ref 70–99)
Potassium: 4.2 mEq/L (ref 3.5–5.1)
Sodium: 137 mEq/L (ref 135–145)

## 2011-05-29 MED ORDER — SPIRONOLACTONE 25 MG PO TABS: ORAL_TABLET | ORAL | Status: DC

## 2011-05-29 NOTE — Progress Notes (Signed)
HPI:  Susan Peck is a 45 year old African American femalae with systolic heart failure, HTN, GERD and asthma. Referred for post-hospital f/u in HF clinic.   2004 cardiac cath by Dr Campbell Lerner in [place], [place] with one blockage noted - no stent. EF said to be normal. About 1 year ago at [facility] noted murmur and she was referred to Dr Katrinka Blazing EF 25-30%. Dr Katrinka Blazing changed her over to Metoprolol.  She has not followed up with Dr Katrinka Blazing due to no payment.   Recently admitted due to acute HF after not taking her meds.BNP was found to be mildly elevated in the 500s.  She was admitted and started on IV Lasix.  She diuresed well.  After she started diuresing  well, her weight dropped approximately 3 pounds.   She is able to ambulate well on room air and her BNP  on day of discharge was down to 150. 04/03/2011  ECHO 25-30% EF which is decreased from 35-40% noted in 2012. She is referred by Dr Rito Ehrlich post hospitalization f/u.   10/26 Sleep study revealed moderate obstructive sleep apnea with desaturations noted.     She is here for follow up.  Denies SOB / PND/ Orthopnea. Dizziness. SOB walking up steps. She has been taking extra Lisinopril instead of Lasix because she was confused about the medications. She as has had at least 3 extra Lisinopril over th last week. Compliant with all medications. She is weighing daily.  Weight at home 208-213. Lower extremity edema.    ROS: All other systems normal except as mentioned in HPI, past medical history and problem list.    Past Medical History  Diagnosis Date  . Systolic heart failure     May 2011 EF 35-40%, 04/03/11 EF 25-30%  . Hypertension   . Asthma   . Obesity   . GERD (gastroesophageal reflux disease)     Current Outpatient Prescriptions  Medication Sig Dispense Refill  . ALBUTEROL SULFATE IN Inhale 2 puffs into the lungs 2 (two) times daily as needed.        Marland Kitchen aspirin 81 MG tablet Take 81 mg by mouth daily.        . carvedilol (COREG) 12.5 MG tablet  Take 1.5 tablets (18.75 mg total) by mouth 2 (two) times daily with a meal.  90 tablet  6  . dexlansoprazole (DEXILANT) 60 MG capsule Take 60 mg by mouth daily.        . furosemide (LASIX) 40 MG tablet Take 40 mg by mouth daily.        Marland Kitchen lisinopril (PRINIVIL,ZESTRIL) 20 MG tablet Take 10 mg by mouth daily.       Bertram Gala Glycol-Propyl Glycol (SYSTANE) 0.4-0.3 % SOLN Apply 1 drop to eye daily as needed.        Marland Kitchen spironolactone (ALDACTONE) 25 MG tablet Take 0.5 tablets (12.5 mg total) by mouth daily.  30 tablet  6     No Known Allergies  History   Social History  . Marital Status: Married    Spouse Name: N/A    Number of Children: N/A  . Years of Education: N/A   Occupational History  .  [employer]   Social History Main Topics  . Smoking status: Never Smoker   . Smokeless tobacco: Not on file  . Alcohol Use: No  . Drug Use: No  . Sexually Active: Not on file   Other Topics Concern  . Not on file   Social  History Narrative   She lives with her husband and son.  She is works for [employer].    Family History  Problem Relation Age of Onset  . Heart disease Maternal Aunt   . Cancer Mother   . Hypertension Maternal Grandmother     PHYSICAL EXAM: Filed Vitals:   05/29/11 1504  BP: 124/76  Pulse: 63  Weight 213 General:  Well appearing. No respiratory difficulty HEENT: normal Neck: supple. JVP 6-7 Carotids 2+ bilat; no bruits. No lymphadenopathy or thryomegaly appreciated. Cor: PMI nondisplaced. Regular rate & rhythm. No rubs or murmurs.  Lungs: clear Abdomen: soft, nontender, obese, nondistended. No hepatosplenomegaly. No bruits or masses. Good bowel sounds. Extremities: no cyanosis, clubbing, rash. tr edema Rand L pedal pulse 3+ Neuro: alert & oriented x 3, cranial nerves grossly intact. moves all 4 extremities w/o difficulty. Affect pleasant.    ASSESSMENT & PLAN:

## 2011-05-29 NOTE — Assessment & Plan Note (Addendum)
Sleep study results reviewed. Will refer to pulmonology for CPAP initiation.

## 2011-05-29 NOTE — Patient Instructions (Addendum)
Please follow up in 2 weeks (ECHO at the same time)  Your physician has requested that you have an echocardiogram. Echocardiography is a painless test that uses sound waves to create images of your heart. It provides your doctor with information about the size and shape of your heart and how well your heart's chambers and valves are working. This procedure takes approximately one hour. There are no restrictions for this procedure.  You have been referred to Pulmonology for sleep apnea due abnormal sleep study  Take Spironolactone 25 mg daily  LASIX is your diuretic

## 2011-05-29 NOTE — Assessment & Plan Note (Addendum)
NYHA II-III. Volume status stable.  Taking all medications however she was taking extra Lisinopril instead of Lasix. Re-educated on the purpose of Lisinopril versus Lasix. Provided pill boxes to assist with medications. Will increase Spironolactone to 25 mg daily. Check BMET today. Follow up in 2 weeks for repeat ECHO.

## 2011-05-29 NOTE — Procedures (Signed)
NAME:  Susan Peck, HOFFART              ACCOUNT NO.:  [id number]  MEDICAL RECORD NO.:  [id number]          PATIENT TYPE:  OUT  LOCATION:  SLEEP CENTER                 FACILITY:  [facility]  PHYSICIAN:  Jayveion Stalling D. Maple Hudson, MD, FCCP, FACPDATE OF BIRTH:  05-[birth year]  DATE OF STUDY:  05/19/2011                           NOCTURNAL POLYSOMNOGRAM  REFERRING PHYSICIAN:  Bevelyn Buckles. Bensimhon, MD  INDICATION FOR STUDY:  Hypersomnia with sleep apnea.  EPWORTH SLEEPINESS SCORE:  14/24.  BMI 36, weight 210 pounds, height 64 inches, neck 15 inch.  MEDICATIONS:  Home medications are charted and reviewed.  SLEEP ARCHITECTURE:  Split study protocol.  During the diagnostic phase, total sleep time 146.5 minutes with sleep efficiency 88.8%.  Stage I was 10.2%, stage II 89.8%, stages III and REM were absent.  Sleep latency 14 minutes.  Awake after sleep onset 4.5 minutes.  Arousal index 4.1.  BEDTIME MEDICATION:  None.  RESPIRATORY DATA:  Apnea hypotony index (AHI) 16.4 per hour.  A total of 40 events were scored, all as hypopneas associated with supine sleep position.  CPAP was then titrated to 12 CWP.  On review, it appears that good control was obtained at 8 CWP.  Once the patient started entering REM sleep, the events appeared again and were not controlled before the technician ran out of time.  At final CPAP pressure 12 CWP, AHI was 12.6 per hour.  The patient wore a small ResMed Quattro FX full-face mask with heated humidifier.  OXYGEN DATA:  Mild snoring before CPAP with oxygen desaturation to a nadir of 79%.  With CPAP titration, mean oxygen saturation held 93.8% on room air and snoring was prevented until the last 2 hours of sleep when breakthrough was noted during REM.  CARDIAC DATA:  Sinus rhythm with occasional PVC.  MOVEMENT-PARASOMNIA:  No significant movement disturbance.  Bathroom x1.  IMPRESSION-RECOMMENDATION: 1. Sleep architecture for the study night was relatively stable with  limited waking associated with introduction of CPAP.  She describes     her home sleep as "restless." 2. Moderate obstructive sleep apnea/hypopnea syndrome, apnea/hypopnea     index 40 per hour.  Supine events, hypopneas, with mild snoring and     oxygen desaturation to a nadir of 79% on room air. 3. CPAP titration provided good control at 8 CWP during non-REM sleep,     apnea/hypopnea index 3.2 per hour.  However, this was insufficient     to maintain control during REM sleep in the last 2 hours of the     study night.  A pressure of 12 CWP was associated with a few     residual events averaging 12.6 per hour.  Suggest initial auto-     titration for pressure assessment in the home environment.  She     wore a small ResMed Quattro FX full-face mask with heated     humidifier.     Mrytle Bento D. Maple Hudson, MD, [facility], FACP Diplomate, Biomedical engineer of Sleep Medicine Electronically Signed    CDY/MEDQ  D:  05/27/2011 08:43:49  T:  05/27/2011 14:19:17  Job:  [id number]

## 2011-05-29 NOTE — Progress Notes (Signed)
Encounter addended by: Tonye Becket, NP on: 05/29/2011  4:12 PM<BR>     Documentation filed: Follow-up Section, LOS Section

## 2011-06-10 NOTE — Progress Notes (Signed)
Patient seen and examined with Amy Clegg, NP. We discussed all aspects of the encounter. I agree with the assessment and plan as stated above.   

## 2011-06-13 ENCOUNTER — Ambulatory Visit (HOSPITAL_COMMUNITY): Admission: RE | Admit: 2011-06-13 | Payer: 59 | Source: Ambulatory Visit

## 2011-06-13 ENCOUNTER — Encounter (HOSPITAL_COMMUNITY): Payer: 59

## 2011-06-16 ENCOUNTER — Encounter: Payer: Self-pay | Admitting: Internal Medicine

## 2011-06-26 ENCOUNTER — Institutional Professional Consult (permissible substitution): Payer: 59 | Admitting: Pulmonary Disease

## 2011-06-27 ENCOUNTER — Ambulatory Visit (HOSPITAL_COMMUNITY): Payer: 59

## 2011-06-27 ENCOUNTER — Ambulatory Visit (HOSPITAL_COMMUNITY)
Admission: RE | Admit: 2011-06-27 | Discharge: 2011-06-27 | Disposition: A | Discharge status: Home / Self Care | Payer: 59 | Source: Ambulatory Visit | Attending: Adult Health | Admitting: Adult Health

## 2011-06-27 DIAGNOSIS — I517 Cardiomegaly: Secondary | ICD-10-CM

## 2011-06-27 DIAGNOSIS — I5022 Chronic systolic (congestive) heart failure: Secondary | ICD-10-CM | POA: Insufficient documentation

## 2011-06-27 DIAGNOSIS — I10 Essential (primary) hypertension: Secondary | ICD-10-CM | POA: Insufficient documentation

## 2011-06-27 DIAGNOSIS — I509 Heart failure, unspecified: Secondary | ICD-10-CM | POA: Insufficient documentation

## 2011-06-27 NOTE — Progress Notes (Signed)
*  PRELIMINARY RESULTS* Echocardiogram 2D Echocardiogram has been performed.  Susan Peck [facility] 06/27/2011, 4:12 PM

## 2011-07-03 ENCOUNTER — Institutional Professional Consult (permissible substitution): Payer: 59 | Admitting: Pulmonary Disease

## 2011-07-10 ENCOUNTER — Ambulatory Visit (HOSPITAL_COMMUNITY)
Admission: RE | Admit: 2011-07-10 | Discharge: 2011-07-10 | Disposition: A | Discharge status: Home / Self Care | Payer: 59 | Source: Ambulatory Visit | Attending: Internal Medicine | Admitting: Internal Medicine

## 2011-07-10 ENCOUNTER — Other Ambulatory Visit: Payer: Self-pay

## 2011-07-10 ENCOUNTER — Telehealth (HOSPITAL_COMMUNITY): Payer: Self-pay | Admitting: *Deleted

## 2011-07-10 VITALS — BP 136/80 | HR 71 | Wt 213.2 lb

## 2011-07-10 DIAGNOSIS — I5022 Chronic systolic (congestive) heart failure: Secondary | ICD-10-CM | POA: Insufficient documentation

## 2011-07-10 DIAGNOSIS — E049 Nontoxic goiter, unspecified: Secondary | ICD-10-CM | POA: Insufficient documentation

## 2011-07-10 DIAGNOSIS — G4733 Obstructive sleep apnea (adult) (pediatric): Secondary | ICD-10-CM | POA: Insufficient documentation

## 2011-07-10 LAB — TSH: TSH: 1.518 u[IU]/mL (ref 0.350–4.500)

## 2011-07-10 LAB — T4, FREE: Free T4: 1.24 ng/dL (ref 0.80–1.80)

## 2011-07-10 MED ORDER — LISINOPRIL 20 MG PO TABS: 20.0000 mg | ORAL_TABLET | Freq: Every day | ORAL | Status: DC

## 2011-07-10 NOTE — Telephone Encounter (Signed)
Susan Peck called back this afternoon.   She forgot to speak with you regarding her echo cardiogram results.  She would like a call back.  Thanks.

## 2011-07-10 NOTE — Assessment & Plan Note (Signed)
Encouraged to follow up with pulmonologist regarding sleep apnea.

## 2011-07-10 NOTE — Assessment & Plan Note (Addendum)
NYHA II-III. Volume status stable. EF remains low,  despite medication titration.Continue to aggresively titrate medication Will increase Lisinopril 20 mg daily.  Check Thyroid panel due to ongoing fatigue. Follow up in 3 weeks. Next visit will repeat BMET.  Patient seen and examined with Tonye Becket, NP. We discussed all aspects of the encounter. I agree with the assessment and plan as stated above. Discussed need for possible ICD in future if EF not improving. Stressed need to f/u with sleep study as I suspect she has bad OSA.

## 2011-07-10 NOTE — Assessment & Plan Note (Signed)
Ongoing fatigue. Goiter palpated. Will check Thyroid panel and ultrasound of neck.

## 2011-07-10 NOTE — Patient Instructions (Signed)
Follow in 3 weeks.  Please schedule Ultrasound neck prior to follow up.   In one week please take Lisinopril 20 mg daily  Do the following things EVERYDAY: 1) Weigh yourself in the morning before breakfast. Write it down and keep it in a log. 2) Take your medicines as prescribed 3) Eat low salt foods-Limit salt (sodium) to 2000mg  per day.  4) Stay as active as you can everyday

## 2011-07-10 NOTE — Progress Notes (Signed)
Patient ID: Susan Peck, female   DOB: 06/10/1966, 44 y.o.   MRN: [id number]    HPI:  Susan Peck is a 45 year old African American female with systolic heart failure, HTN, GERD and asthma. Referred for post-hospital f/u in HF clinic.   2004 cardiac cath by Dr Susan Peck in [place], [place] with one blockage noted - no stent. EF said to be normal. About 1 year ago at [facility] noted murmur and she was referred to Dr Susan Peck EF 25-30%. Dr Susan Peck changed her over to Metoprolol.  She has not followed up with Dr Susan Peck due to no payment.   Admitted earlier this year due to acute HF after not taking her meds.BNP was found to be mildly elevated in the 500s.  She was admitted and started on IV Lasix.  She diuresed well.  After she started diuresing  well, her weight dropped approximately 3 pounds.   She is able to ambulate well on room air and her BNP  on day of discharge was down to 150. 04/03/2011  ECHO 25-30% EF which is decreased from 35-40% noted in 2012.   10/26 Sleep study revealed moderate obstructive sleep apnea with desaturations noted.   November /2012 Medication confusion. She was taking extra Lisinopril.   11/5 Potassium 4.7 creatinine 0.71  06/26/2012 ECHO EF 30-35%  She is here for follow up. Complains of fatigue, chest pain, and headache.  Productive cough. Complains of chest pain that extends down L arm similar to pain in 2004 that only   2 times this week that lasted for 20 minutes. She is currently on a Z-pack for sinus infection. Occasional dizziness. Denies PND/Orthopnea. Weight at home 210-214. When weight increased to 214 she did take extra Lasix. She is taking extra Lasix 1-2 times per week. 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 Sleeps on 3 pillows. She is stressed because her husband is unemployed. Compliant with medications. Plan for follow up with pulmonology in January 2013 for sleep apnea.     ROS: All other systems normal except as mentioned in HPI, past medical  history and problem list.    Past Medical History  Diagnosis Date  . Systolic heart failure     May 2011 EF 35-40%, 04/03/11 EF 25-30%  . Hypertension   . Asthma   . Obesity   . GERD (gastroesophageal reflux disease)     Current Outpatient Prescriptions  Medication Sig Dispense Refill  . ALBUTEROL SULFATE IN Inhale 2 puffs into the lungs 2 (two) times daily as needed.        Marland Kitchen aspirin 81 MG tablet Take 81 mg by mouth daily.        . carvedilol (COREG) 12.5 MG tablet Take 1.5 tablets (18.75 mg total) by mouth 2 (two) times daily with a meal.  90 tablet  6  . dexlansoprazole (DEXILANT) 60 MG capsule Take 60 mg by mouth daily.        . furosemide (LASIX) 40 MG tablet Take 40 mg by mouth daily.        Marland Kitchen lisinopril (PRINIVIL,ZESTRIL) 20 MG tablet Take 10 mg by mouth daily.       Bertram Gala Glycol-Propyl Glycol (SYSTANE) 0.4-0.3 % SOLN Apply 1 drop to eye daily as needed.        Marland Kitchen spironolactone (ALDACTONE) 25 MG tablet Take one tab daily  30 tablet  6     No Known Allergies  History   Social History  . Marital  Status: Married    Spouse Name: N/A    Number of Children: N/A  . Years of Education: N/A   Occupational History  .  [employer]   Social History Main Topics  . Smoking status: Never Smoker   . Smokeless tobacco: Not on file  . Alcohol Use: No  . Drug Use: No  . Sexually Active: Not on file   Other Topics Concern  . Not on file   Social History Narrative   She lives with her husband and son.  She is works for [employer].    Family History  Problem Relation Age of Onset  . Heart disease Maternal Aunt   . Cancer Mother   . Hypertension Maternal Grandmother     PHYSICAL EXAM: Filed Vitals:   07/10/11 1335  BP: 136/80  Pulse: 71  Weight 213 (213) General:  Well appearing. No respiratory difficulty HEENT:  Goiter Neck: supple. JVP 6-7 Carotids 2+ bilat; no bruits. No lymphadenopathy. + nodular goiter Cor: PMI nondisplaced. Regular rate & rhythm. No rubs  or murmurs.  Lungs: clear Abdomen: soft, nontender, obese, nondistended. No hepatosplenomegaly. No bruits or masses. Good bowel sounds. Extremities: no cyanosis, clubbing, rash. tr edema Rand L pedal pulse normal Neuro: alert & oriented x 3, cranial nerves grossly intact. moves all 4 extremities w/o difficulty. Affect pleasant.   ASSESSMENT & PLAN:

## 2011-07-11 NOTE — Progress Notes (Signed)
Encounter addended by: Donia Pounds on: 07/11/2011  8:00 AM<BR>     Documentation filed: Charges VN

## 2011-07-13 ENCOUNTER — Ambulatory Visit (HOSPITAL_COMMUNITY)
Admission: RE | Admit: 2011-07-13 | Discharge: 2011-07-13 | Disposition: A | Discharge status: Home / Self Care | Payer: 59 | Source: Ambulatory Visit | Attending: Adult Health | Admitting: Adult Health

## 2011-07-13 DIAGNOSIS — I5022 Chronic systolic (congestive) heart failure: Secondary | ICD-10-CM

## 2011-07-13 DIAGNOSIS — E042 Nontoxic multinodular goiter: Secondary | ICD-10-CM | POA: Insufficient documentation

## 2011-07-26 ENCOUNTER — Other Ambulatory Visit (HOSPITAL_COMMUNITY): Payer: Self-pay | Admitting: Adult Health

## 2011-07-26 ENCOUNTER — Telehealth (HOSPITAL_COMMUNITY): Payer: Self-pay | Admitting: Adult Health

## 2011-07-26 DIAGNOSIS — E049 Nontoxic goiter, unspecified: Secondary | ICD-10-CM

## 2011-07-26 NOTE — Telephone Encounter (Signed)
Message copied by Tonye Becket D on Wed Jul 26, 2011  8:45 AM ------      Message from: Arvilla Meres R      Created: Mon Jul 17, 2011 11:00 PM       Please arrange fine needle aspiration with interventional radiology and forward to PCP. thanks

## 2011-07-26 NOTE — Telephone Encounter (Signed)
Provided ECHO results as discussed at 12/17 at office visit. Also informed she will need fine needle aspiration to evaluate thyroid nodule. She is aware [facility] will contact for appointment.

## 2011-07-26 NOTE — Telephone Encounter (Signed)
Left message to call Heart Failure Clinic for follow up on ultrasound of the neck.

## 2011-07-27 ENCOUNTER — Other Ambulatory Visit (HOSPITAL_COMMUNITY)
Admission: RE | Admit: 2011-07-27 | Discharge: 2011-07-27 | Disposition: A | Discharge status: Home / Self Care | Payer: 59 | Source: Ambulatory Visit | Attending: Interventional Radiology | Admitting: Interventional Radiology

## 2011-07-27 ENCOUNTER — Ambulatory Visit
Admission: RE | Admit: 2011-07-27 | Discharge: 2011-07-27 | Disposition: A | Discharge status: Home / Self Care | Payer: 59 | Source: Ambulatory Visit | Attending: Adult Health | Admitting: Adult Health

## 2011-07-27 ENCOUNTER — Ambulatory Visit (INDEPENDENT_AMBULATORY_CARE_PROVIDER_SITE_OTHER): Payer: 59 | Admitting: Pulmonary Disease

## 2011-07-27 ENCOUNTER — Encounter: Payer: Self-pay | Admitting: Pulmonary Disease

## 2011-07-27 VITALS — BP 124/82 | HR 79 | Temp 98.3°F | Ht 65.0 in | Wt 215.4 lb

## 2011-07-27 DIAGNOSIS — G4733 Obstructive sleep apnea (adult) (pediatric): Secondary | ICD-10-CM

## 2011-07-27 DIAGNOSIS — E049 Nontoxic goiter, unspecified: Secondary | ICD-10-CM

## 2011-07-27 HISTORY — DX: Nontoxic goiter, unspecified: E04.9

## 2011-07-27 NOTE — Progress Notes (Signed)
  Subjective:    Patient ID: Susan Peck, female    DOB: Jul 15, 1966, 46 y.o.   MRN: [id number]  HPI The pt is a 45y/o female who I have been asked to see for management of osa.  She recently underwent npsg, where she was found to have an AHI 16/hr and cpap titrated to 12cm.  The patient has been noted to have snoring intermittently, as well as an abnormal breathing pattern during sleep.  She has frequent awakenings at night, and can awaken with shortness of breath as well.  She is not rested in the mornings upon arising, and her Epworth score at the time of her sleep study was 14.  The patient notes significant sleep pressure during the day with periods of inactivity, and will easily fall asleep with television or movies in the evening.  She has intermittent sleep pressure with driving.  Of note, her weight is up about 20 pounds over the last few years.  Sleep Questionnaire: What time do you typically go to bed?( Between what hours) 10 to 12 am How long does it take you to fall asleep? 5 to 15 mins How many times during the night do you wake up? 4 What time do you get out of bed to start your day? 0530 Do you drive or operate heavy machinery in your occupation? No How much has your weight changed (up or down) over the past two years? (In pounds) 20 lb (9.072 kg) Have you ever had a sleep study before? Yes If yes, location of study? [facility] If yes, date of study? 05/19/2011 Do you currently use CPAP? No Do you wear oxygen at any time? No    Review of Systems  Constitutional: Negative for fever and unexpected weight change.  HENT: Positive for congestion. Negative for ear pain, nosebleeds, sore throat, rhinorrhea, sneezing, trouble swallowing, dental problem, postnasal drip and sinus pressure.   Eyes: Negative for redness and itching.  Respiratory: Positive for shortness of breath. Negative for cough, chest tightness and wheezing.   Cardiovascular: Positive for leg swelling. Negative for palpitations.    Gastrointestinal: Positive for abdominal pain. Negative for nausea and vomiting.  Genitourinary: Negative for dysuria.  Musculoskeletal: Negative for joint swelling.  Skin: Negative for rash.  Neurological: Negative for headaches.  Hematological: Does not bruise/bleed easily.  Psychiatric/Behavioral: Positive for dysphoric mood. The patient is not nervous/anxious.        Objective:   Physical Exam Constitutional:  Obese female, no acute distress  HENT:  Nares patent without discharge, large turbinates  Oropharynx without exudate, palate and uvula are elongated with side wall narrowing.  Eyes:  Perrla, eomi, no scleral icterus  Neck:  No JVD, enlarged thyroid on right  Cardiovascular:  Normal rate, regular rhythm, no rubs or gallops.  2/6 sem        Intact distal pulses  Pulmonary :  Normal breath sounds, no stridor or respiratory distress   No rales, rhonchi, or wheezing  Abdominal:  Soft, nondistended, bowel sounds present.  No tenderness noted.   Musculoskeletal:  mild lower extremity edema noted.  Lymph Nodes:  No cervical lymphadenopathy noted  Skin:  No cyanosis noted  Neurologic:  Alert, appropriate, moves all 4 extremities without obvious deficit.         Assessment & Plan:

## 2011-07-27 NOTE — Assessment & Plan Note (Signed)
The patient has mild to moderate obstructive sleep apnea by her recent sleep study, and also has underlying significant cardiac disease.  I have had a long discussion with her about the pathophysiology of sleep apnea, including its impact on her quality of life and cardiovascular health.  She obviously needs to work aggressively on weight loss, but I would recommend aggressive treatment while she is doing so.  I have discussed with her the possibility of upper airway surgery, dental appliance, and also CPAP.  I would think CPAP is her best option, and the patient is agreeable. I will set the patient up on cpap at a moderate pressure level to allow for desensitization, and will troubleshoot the device over the next 4-6weeks if needed.  The pt is to call me if having issues with tolerance.  Will then optimize the pressure once patient is able to wear cpap on a consistent basis.

## 2011-07-27 NOTE — Patient Instructions (Signed)
Will start on cpap at a moderate level, and see you back in 5 weeks.  Please call if having tolerance issues. Work on weight loss.

## 2011-07-31 ENCOUNTER — Ambulatory Visit (HOSPITAL_COMMUNITY)
Admission: RE | Admit: 2011-07-31 | Discharge: 2011-07-31 | Disposition: A | Discharge status: Home / Self Care | Payer: 59 | Source: Ambulatory Visit | Attending: Internal Medicine | Admitting: Internal Medicine

## 2011-07-31 VITALS — BP 136/82 | HR 62 | Wt 212.5 lb

## 2011-07-31 DIAGNOSIS — I5022 Chronic systolic (congestive) heart failure: Secondary | ICD-10-CM

## 2011-07-31 NOTE — Assessment & Plan Note (Signed)
Thyroid biopsy completed 1/3 results pending.

## 2011-07-31 NOTE — Progress Notes (Signed)
Patient ID: Susan Peck, female   DOB: [birth year], 46 y.o.   MRN: [id number]  HPI:  Susan Peck is a 46 year old African American female with systolic heart failure, HTN, GERD and asthma. Referred for post-hospital f/u in HF clinic.   2004 cardiac cath by Dr Campbell Lerner in [place], [place] with one blockage noted - no stent. EF said to be normal. About 1 year ago at [facility] noted murmur and Susan Peck was referred to Dr Katrinka Blazing EF 25-30%. Dr Katrinka Blazing changed her over to Metoprolol.  Susan Peck has not followed up with Dr Katrinka Blazing due to no payment.   Admitted earlier this year due to acute HF after not taking her meds.BNP was found to be mildly elevated in the 500s.  Susan Peck was admitted and started on IV Lasix.  Susan Peck diuresed well.  After Susan Peck started diuresing  well, her weight dropped approximately 3 pounds.   Susan Peck is able to ambulate well on room air and her BNP  on day of discharge was down to 150. 04/03/2011  ECHO 25-30% EF which is decreased from 35-40% noted in 2012.   10/26 Sleep study revealed moderate obstructive sleep apnea with desaturations noted.   November 2012 Medication confusion. Susan Peck was taking extra Lisinopril.   11/5 Potassium 4.7 creatinine 0.71  06/26/2012 ECHO EF 30-35%  Susan Peck is here for follow up. Denies SOB/PND. + Orthopnea and mild LE edema.  Denies dizziness. Sleeps on 5 pillows. Weight 210-215.Not compliant with diet. Taking extra lasix of lasix as needed. Thyroid u/s with multiple, multiple nodules. Thyroid biopsy (FNA) performed 07/27/11 - path pending. Per Dr Shelle Iron Susan Peck does need CPAP. CPAP pending.  Compliant with all medications. Trying to walk with her husband but gets winded easily and can't keep up.   ROS: All other systems normal except as mentioned in HPI, past medical history and problem list.    Past Medical History  Diagnosis Date  . Systolic heart failure     May 2011 EF 35-40%, 04/03/11 EF 25-30%  . Hypertension   . Asthma   . Obesity   . GERD (gastroesophageal reflux disease)      Current Outpatient Prescriptions  Medication Sig Dispense Refill  . ALBUTEROL SULFATE IN Inhale 2 puffs into the lungs 2 (two) times daily as needed.        Marland Kitchen aspirin 81 MG tablet Take 81 mg by mouth daily.        . carvedilol (COREG) 12.5 MG tablet Take 1.5 tablets (18.75 mg total) by mouth 2 (two) times daily with a meal.  90 tablet  6  . furosemide (LASIX) 40 MG tablet Take 40 mg by mouth daily.        Marland Kitchen lisinopril (PRINIVIL,ZESTRIL) 20 MG tablet Take 1 tablet (20 mg total) by mouth daily.  30 tablet  6  . mometasone (NASONEX) 50 MCG/ACT nasal spray Place 2 sprays into the nose daily.        Bertram Gala Glycol-Propyl Glycol (SYSTANE) 0.4-0.3 % SOLN Apply 1 drop to eye daily as needed.        Marland Kitchen spironolactone (ALDACTONE) 25 MG tablet Take one tab daily  30 tablet  6     No Known Allergies  History   Social History  . Marital Status: Married    Spouse Name: N/A    Number of Children: Y  . Years of Education: N/A   Occupational History  .    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 Smoking status: Never Smoker   . Smokeless tobacco: Not on file  . Alcohol Use: No  . Drug Use: No  . Sexually Active: Not on file   Other Topics Concern  . Not on file   Social History Narrative   Susan Peck lives with her husband and son.  Susan Peck is works for [employer].    Family History  Problem Relation Age of Onset  . Heart disease Maternal Aunt   . Breast cancer Mother   . Hypertension Maternal Grandmother   . Breast cancer Maternal Aunt     PHYSICAL EXAM: Filed Vitals:   07/31/11 1626  BP: 136/82  Pulse: 62  Weight 212.8 213) General:  Well appearing. No respiratory difficulty HEENT:  Normal Neck: supple. JVP 6-7 Carotids 2+ bilat; no bruits. No lymphadenopathy. + large nodular goiter Cor: PMI nondisplaced. Regular rate & rhythm. No rubs or murmurs.  Lungs: clear Abdomen: soft, nontender, obese, nondistended. No hepatosplenomegaly. No  bruits or masses. Good bowel sounds. Extremities: no cyanosis, clubbing, rash. tr edema Rand L pedal pulse normal Neuro: alert & oriented x 3, cranial nerves grossly intact. moves all 4 extremities w/o difficulty. Affect pleasant.   ASSESSMENT & PLAN:

## 2011-07-31 NOTE — Patient Instructions (Signed)
Take Carvedilol 25 mg bid  Follow up in 3 weeks  Do the following things EVERYDAY: 1) Weigh yourself in the morning before breakfast. Write it down and keep it in a log. 2) Take your medicines as prescribed 3) Eat low salt foods-Limit salt (sodium) to 2000mg  per day.  4) Stay as active as you can everyday

## 2011-07-31 NOTE — Assessment & Plan Note (Addendum)
NYHA II. Volume status stable. Increase Carvedilol 25 mg bid. Re-educated on low salt diet and fluid restriction. Will need repeat ECHO in 2 months.   Patient seen and examined with Tonye Becket, NP. We discussed all aspects of the encounter. I agree with the assessment and plan as stated above. Agree with titrating b-blocker. Reinforced need for daily weights and reviewed use of sliding scale diuretics as well as CPAP. Encouraged progressing with exercise on a slow and steady basis to help with endurance and weight loss. Will need repeat echo in 2 months. IF EF still < = 35% consider ICD.

## 2011-07-31 NOTE — Assessment & Plan Note (Signed)
CPAP to be obtained from [facility].

## 2011-08-01 ENCOUNTER — Telehealth (HOSPITAL_COMMUNITY): Payer: Self-pay | Admitting: *Deleted

## 2011-08-01 NOTE — Telephone Encounter (Signed)
Ms Ladona Ridgel called today.  She would like a call with the results of her recent biopsy when the results are ready.  Thanks!

## 2011-08-03 NOTE — Telephone Encounter (Signed)
Pt aware biopsy was benign

## 2011-08-03 NOTE — Telephone Encounter (Signed)
Results are back, Left message to call back

## 2011-08-16 ENCOUNTER — Telehealth (HOSPITAL_COMMUNITY): Payer: Self-pay | Admitting: *Deleted

## 2011-08-16 NOTE — Telephone Encounter (Signed)
Pt states she believes she has a sinus infection, no fever but ear ache and sore throat very stuffy, yellowish colored sputum she states she had similar symptoms in Dec and pcp gave her a z-pak which helped, she called them today but was told she had to have an appointment but she does not have the money for the co-pay, she has started taking Coricidin over the counter, but hasn't helped so far, will check with Dr Gala Romney

## 2011-08-16 NOTE — Telephone Encounter (Signed)
Ms Susan Peck called this am.  She believes she has a sinus infection and needs some medication called in for her.

## 2011-08-17 NOTE — Telephone Encounter (Signed)
Discussed with Dr. Gala Romney and he does not want to prescribe an antibiotic and feels she needs to call her PCP back and if not then she can be evaluated at an urgent care.  I have called the patient and left a message concerning the above, asked her to call back if any questions.

## 2011-08-21 ENCOUNTER — Ambulatory Visit (HOSPITAL_COMMUNITY)
Admission: RE | Admit: 2011-08-21 | Discharge: 2011-08-21 | Disposition: A | Discharge status: Home / Self Care | Payer: 59 | Source: Ambulatory Visit | Attending: Internal Medicine | Admitting: Internal Medicine

## 2011-08-21 ENCOUNTER — Emergency Department (HOSPITAL_COMMUNITY): Payer: 59

## 2011-08-21 ENCOUNTER — Encounter (HOSPITAL_COMMUNITY): Payer: Self-pay | Admitting: Emergency Medicine

## 2011-08-21 ENCOUNTER — Emergency Department (HOSPITAL_COMMUNITY)
Admission: EM | Admit: 2011-08-21 | Discharge: 2011-08-21 | Disposition: A | Discharge status: Home / Self Care | Payer: 59 | Attending: Emergency Medicine | Admitting: Emergency Medicine

## 2011-08-21 ENCOUNTER — Other Ambulatory Visit: Payer: Self-pay

## 2011-08-21 ENCOUNTER — Encounter (HOSPITAL_COMMUNITY): Payer: Self-pay

## 2011-08-21 VITALS — BP 120/80 | HR 70 | Wt 208.5 lb

## 2011-08-21 DIAGNOSIS — R079 Chest pain, unspecified: Secondary | ICD-10-CM | POA: Insufficient documentation

## 2011-08-21 DIAGNOSIS — Z79899 Other long term (current) drug therapy: Secondary | ICD-10-CM | POA: Insufficient documentation

## 2011-08-21 DIAGNOSIS — I5022 Chronic systolic (congestive) heart failure: Secondary | ICD-10-CM

## 2011-08-21 DIAGNOSIS — IMO0002 Reserved for concepts with insufficient information to code with codable children: Secondary | ICD-10-CM | POA: Insufficient documentation

## 2011-08-21 DIAGNOSIS — S29011A Strain of muscle and tendon of front wall of thorax, initial encounter: Secondary | ICD-10-CM

## 2011-08-21 DIAGNOSIS — J069 Acute upper respiratory infection, unspecified: Secondary | ICD-10-CM | POA: Insufficient documentation

## 2011-08-21 MED ORDER — HYDROCODONE-ACETAMINOPHEN 5-325 MG PO TABS
1.0000 | ORAL_TABLET | Freq: Once | ORAL | Status: AC
  Administered 2011-08-21: 1 via ORAL
  Filled 2011-08-21: qty 1

## 2011-08-21 MED ORDER — CARVEDILOL 25 MG PO TABS: 25.0000 mg | ORAL_TABLET | Freq: Two times a day (BID) | ORAL | Status: DC
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
## 2011-08-21 NOTE — Assessment & Plan Note (Addendum)
NYHA II. Volume status stable. Will increase Carvedilol 25 mg bid. Re-educated on low salt diet and medication purpose. Follow up in 1 month.   Patient seen and examined with Tonye Becket, NP. We discussed all aspects of the encounter. I agree with the assessment and plan as stated above.  Overall HF and EF is improving. Suspect she will have significant LV recovery so will hold off on ICD referral. Continue medication titration.Reinforced need for daily weights and reviewed use of sliding scale diuretics.

## 2011-08-21 NOTE — Discharge Instructions (Signed)
Take Tylenol or Advil as needed for pain. Monitor for fever, shortness of breath, worsening symptoms  Antibiotic Nonuse  Your caregiver felt that the infection or problem was not one that would be helped with an antibiotic. Infections may be caused by viruses or bacteria. Only a caregiver can tell which one of these is the likely cause of an illness. A cold is the most common cause of infection in both adults and children. A cold is a virus. Antibiotic treatment will have no effect on a viral infection. Viruses can lead to many lost days of work caring for sick children and many missed days of school. Children may catch as many as 10 "colds" or "flus" per year during which they can be tearful, cranky, and uncomfortable. The goal of treating a virus is aimed at keeping the ill person comfortable. Antibiotics are medications used to help the body fight bacterial infections. There are relatively few types of bacteria that cause infections but there are hundreds of viruses. While both viruses and bacteria cause infection they are very different types of germs. A viral infection will typically go away by itself within 7 to 10 days. Bacterial infections may spread or get worse without antibiotic treatment. Examples of bacterial infections are:  Sore throats (like strep throat or tonsillitis).   Infection in the lung (pneumonia).   Ear and skin infections.  Examples of viral infections are:  Colds or flus.   Most coughs and bronchitis.   Sore throats not caused by Strep.   Runny noses.  It is often best not to take an antibiotic when a viral infection is the cause of the problem. Antibiotics can kill off the helpful bacteria that we have inside our body and allow harmful bacteria to start growing. Antibiotics can cause side effects such as allergies, nausea, and diarrhea without helping to improve the symptoms of the viral infection. Additionally, repeated uses of antibiotics can cause bacteria inside  of our body to become resistant. That resistance can be passed onto harmful bacterial. The next time you have an infection it may be harder to treat if antibiotics are used when they are not needed. Not treating with antibiotics allows our own immune system to develop and take care of infections more efficiently. Also, antibiotics will work better for [place] when they are prescribed for bacterial infections. Treatments for a child that is ill may include:  Give extra fluids throughout the day to stay hydrated.   Get plenty of rest.   Only give your child over-the-counter or prescription medicines for pain, discomfort, or fever as directed by your caregiver.   The use of a cool mist humidifier may help stuffy noses.   Cold medications if suggested by your caregiver.  Your caregiver may decide to start you on an antibiotic if:  The problem you were seen for today continues for a longer length of time than expected.   You develop a secondary bacterial infection.  SEEK MEDICAL CARE IF:  Fever lasts longer than 5 days.   Symptoms continue to get worse after 5 to 7 days or become severe.   Difficulty in breathing develops.   Signs of dehydration develop (poor drinking, rare urinating, dark colored urine).   Changes in behavior or worsening tiredness (listlessness or lethargy).  Document Released: 09/18/2001 Document Revised: 03/22/2011 Document Reviewed: 03/17/2009 [facility] Patient Information 2012 [place], [place].

## 2011-08-21 NOTE — Patient Instructions (Signed)
Take Carvedilol 25 mg twice a day  Do the following things EVERYDAY: 1) Weigh yourself in the morning before breakfast. Write it down and keep it in a log. 2) Take your medicines as prescribed 3) Eat low salt foods-Limit salt (sodium) to 2000mg  per day.  4) Stay as active as you can everyday  Follow up in one month
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
## 2011-08-28 ENCOUNTER — Telehealth (HOSPITAL_COMMUNITY): Payer: Self-pay | Admitting: *Deleted

## 2011-08-28 NOTE — Telephone Encounter (Signed)
Will have Dr Gala Romney do a letter

## 2011-08-28 NOTE — Telephone Encounter (Signed)
Ms Ladona Ridgel called today regarding a letter she was requesting from her last visit on her limitations.  Her employer does not have a form for Susan Peck to fill out regarding her restrictions so she would like Susan Peck to create a note on our letter head regarding her restrictions, ie; lifting. Please call her when you have something for her. Thanks.

## 2011-08-30 ENCOUNTER — Telehealth (HOSPITAL_COMMUNITY): Payer: Self-pay | Admitting: *Deleted

## 2011-08-30 NOTE — Telephone Encounter (Signed)
Pt called complaining of feeling bad today, she states wt was 205 yesterday and was 209 today so she did take an extra 40 mg of Lasix this AM, she states her face was swollen, but seems better now, she continues to feel bad and is lightheaded and dizzy, she is unable to check her BP, no fever she also reports that she feels "shakey" adn at times like her heart is racing, she would like to be seen appt scheduled for tomorrow at 12:30

## 2011-08-31 ENCOUNTER — Encounter (HOSPITAL_COMMUNITY): Payer: Self-pay | Admitting: *Deleted

## 2011-08-31 ENCOUNTER — Ambulatory Visit (HOSPITAL_COMMUNITY)
Admission: RE | Admit: 2011-08-31 | Discharge: 2011-08-31 | Disposition: A | Discharge status: Home / Self Care | Payer: 59 | Source: Ambulatory Visit | Attending: Internal Medicine | Admitting: Internal Medicine

## 2011-08-31 VITALS — BP 130/84 | HR 70 | Wt 210.5 lb

## 2011-08-31 DIAGNOSIS — I5022 Chronic systolic (congestive) heart failure: Secondary | ICD-10-CM | POA: Insufficient documentation

## 2011-08-31 MED ORDER — EPLERENONE 25 MG PO TABS: 25.0000 mg | ORAL_TABLET | Freq: Every day | ORAL | Status: DC

## 2011-08-31 NOTE — Patient Instructions (Signed)
Stop Spironolactone  Take Eplerenone 25 mg daily  Follow up in 3 months  Do the following things EVERYDAY: 1) Weigh yourself in the morning before breakfast. Write it down and keep it in a log. 2) Take your medicines as prescribed 3) Eat low salt foods-Limit salt (sodium) to 2000mg  per day.  4) Stay as active as you can everyday

## 2011-08-31 NOTE — Progress Notes (Signed)
Patient ID: Susan Peck, female   DOB: 26-Nov-1965, 46 y.o.   MRN: [id number]  HPI:  Susan Peck is a 46 year old African American female with systolic heart failure, HTN, GERD and asthma. Referred for post-hospital f/u in HF clinic.   2004 cardiac cath by Dr Campbell Lerner in [place], [place] with one blockage noted - no stent. EF said to be normal. About 1 year ago at [facility] noted murmur and she was referred to Dr Katrinka Blazing EF 25-30%. Dr Katrinka Blazing changed her over to Metoprolol.  She has not followed up with Dr Katrinka Blazing due to no payment.   Admitted earlier this year due to acute HF after not taking her meds.BNP was found to be mildly elevated in the 500s.  She was admitted and started on IV Lasix.  She diuresed well.  After she started diuresing  well, her weight dropped approximately 3 pounds.   She is able to ambulate well on room air and her BNP  on day of discharge was down to 150. 04/03/2011  ECHO 25-30% EF which is decreased from 35-40% noted in 2012.   10/26 Sleep study revealed moderate obstructive sleep apnea with desaturations noted.   06/26/2012 ECHO EF 30-35%  08/20/11 She went to [facility] due to SOB and chest pain after she was arrested  for threatening her son.  She was provided with one Vicodin for pain due to the police placing her on the floor. Court date pending March 2013.   She is here for acute work in due to dizziness. Denies Orthopnea/PND/CP.  Productive cough (clear sputum). Nasal congestion. Weight at home increased 210 and she did take an extra Lasix for the last two days. She continues to have muscle soreness which she attributes to the altercation with the police.  Compliant with all medications. Continues to weigh daily.    ROS: All other systems normal except as mentioned in HPI, past medical history and problem list.    Past Medical History  Diagnosis Date  . Systolic heart failure     May 2011 EF 35-40%, 04/03/11 EF 25-30%  . Hypertension   . Asthma   . Obesity   . GERD  (gastroesophageal reflux disease)     Current Outpatient Prescriptions  Medication Sig Dispense Refill  . aspirin 81 MG tablet Take 81 mg by mouth daily.        . carvedilol (COREG) 25 MG tablet Take 1 tablet (25 mg total) by mouth 2 (two) times daily with a meal.  60 tablet  6  . furosemide (LASIX) 40 MG tablet Take 40 mg by mouth daily.        Marland Kitchen lisinopril (PRINIVIL,ZESTRIL) 20 MG tablet Take 1 tablet (20 mg total) by mouth daily.  30 tablet  6  . spironolactone (ALDACTONE) 25 MG tablet Take one tab daily  30 tablet  6  . albuterol (PROVENTIL HFA;VENTOLIN HFA) 108 (90 BASE) MCG/ACT inhaler Inhale 2 puffs into the lungs every 6 (six) hours as needed.      . mometasone (NASONEX) 50 MCG/ACT nasal spray Place 2 sprays into the nose daily.        Bertram Gala Glycol-Propyl Glycol (SYSTANE) 0.4-0.3 % SOLN Apply 1 drop to eye daily as needed.           No Known Allergies  History   Social History  . Marital Status: Married    Spouse Name: N/A    Number of Children: Y  . Years of Education: N/A  Occupational History  .    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 Smoking status: Never Smoker   . Smokeless tobacco: Not on file  . Alcohol Use: No  . Drug Use: No  . Sexually Active: Not on file   Other Topics Concern  . Not on file   Social History Narrative   She lives with her husband and son.  She is works for [employer].    Family History  Problem Relation Age of Onset  . Heart disease Maternal Aunt   . Breast cancer Mother   . Hypertension Maternal Grandmother   . Breast cancer Maternal Aunt     PHYSICAL EXAM: Filed Vitals:   08/31/11 1242  BP: 130/84  Pulse: 70  Weight 210 (212) General:  Well appearing. No respiratory difficulty HEENT:  Normal Neck: supple. JVP 6-7 Carotids 2+ bilat; no bruits. No lymphadenopathy. + large nodular goiter Cor: PMI nondisplaced. Regular rate & rhythm. No rubs or murmurs.  Lungs:  clear Abdomen: soft, nontender, obese, nondistended. No hepatosplenomegaly. No bruits or masses. Good bowel sounds. Extremities: no cyanosis, clubbing, rash. tr edema Rand L pedal pulse normal Neuro: alert & oriented x 3, cranial nerves grossly intact. moves all 4 extremities w/o difficulty. Affect pleasant.   ASSESSMENT & PLAN:

## 2011-08-31 NOTE — Telephone Encounter (Signed)
Pt was given a note at [facility] 2/7

## 2011-08-31 NOTE — Assessment & Plan Note (Addendum)
NYHA II. Volume status stable. Compliant with medications. Complaining of breast tenderness which could be related to Spironolactone. Will switch to Eplerenone 25 mg daily.  Provided work restriction letter. Follow up in 3 months.   Patient seen and examined with Tonye Becket, NP. We discussed all aspects of the encounter. I agree with the assessment and plan as stated above. Overall doing fairly well. Agree with switching spiro to eplerenone. F/u with sleep study.

## 2011-09-01 ENCOUNTER — Ambulatory Visit: Payer: 59 | Admitting: Pulmonary Disease

## 2011-09-06 ENCOUNTER — Ambulatory Visit: Payer: 59 | Admitting: Pulmonary Disease

## 2011-09-11 MED ORDER — EPLERENONE 25 MG PO TABS: 25.0000 mg | ORAL_TABLET | Freq: Every day | ORAL | Status: DC

## 2011-09-21 ENCOUNTER — Ambulatory Visit (HOSPITAL_COMMUNITY): Payer: 59

## 2011-10-11 ENCOUNTER — Other Ambulatory Visit: Payer: Self-pay | Admitting: Gastroenterology

## 2011-10-11 ENCOUNTER — Telehealth: Payer: Self-pay | Admitting: Pulmonary Disease

## 2011-10-11 DIAGNOSIS — R109 Unspecified abdominal pain: Secondary | ICD-10-CM

## 2011-10-11 NOTE — Telephone Encounter (Signed)
lmomtcb x1 for pt 

## 2011-10-11 NOTE — Telephone Encounter (Signed)
Pt returned call. Says nurse may call her back tomorrow morning. Hazel Sams

## 2011-10-12 ENCOUNTER — Ambulatory Visit
Admission: RE | Admit: 2011-10-12 | Discharge: 2011-10-12 | Disposition: A | Discharge status: Home / Self Care | Payer: 59 | Source: Ambulatory Visit | Attending: Gastroenterology | Admitting: Gastroenterology

## 2011-10-12 DIAGNOSIS — R109 Unspecified abdominal pain: Secondary | ICD-10-CM

## 2011-10-12 MED ORDER — IOHEXOL 300 MG/ML  SOLN
125.0000 mL | Freq: Once | INTRAMUSCULAR | Status: AC | PRN
  Administered 2011-10-12: 125 mL via INTRAVENOUS

## 2011-10-12 NOTE — Telephone Encounter (Signed)
Spoke with pt. She states that she got a [facility] from [facility] stating that she needs to have the second part of his sleep study done. She states that they advised her that the study was inconclusive. Wants to know if [facility] will order another sleep study, or "the other half". Pt seems very confused about what is needed and about who it was that called her with this information. She has ov with KC scheduled for 10/25/11. Please advise, thanks!

## 2011-10-12 NOTE — Telephone Encounter (Signed)
There is no other part of the test.  She was supposed to see me in 5 weeks, and therefore is late for her f/u. She needs ov with me in next 1 week or so, and does not require any other sleep study.  Have her not do anything else until she sees me.

## 2011-10-12 NOTE — Telephone Encounter (Signed)
LMTCB

## 2011-10-12 NOTE — Telephone Encounter (Signed)
LMOM for pt TCB 

## 2011-10-12 NOTE — Telephone Encounter (Signed)
Pt returned call. Susan Peck  

## 2011-10-13 NOTE — Telephone Encounter (Signed)
Pt aware. She has appt on 10-25-11 and wants to keep this appt. Carron Curie, CMA

## 2011-10-18 ENCOUNTER — Telehealth (HOSPITAL_COMMUNITY): Payer: Self-pay | Admitting: *Deleted

## 2011-10-18 MED ORDER — SPIRONOLACTONE 25 MG PO TABS: 25.0000 mg | ORAL_TABLET | Freq: Every day | ORAL | Status: DC

## 2011-10-18 NOTE — Telephone Encounter (Signed)
Ms Susan Peck called this am.  She is requesting a change in her meds.  She is currently taking eplerenone, which is costing her about $20 a month.  She would like to go back on the spironolactone, because that only cost her $4 a month.  Also she is experiencing hip, leg and stomach pain and was told by her pcp that she could take ibuprofen, however, she is not sure if its ok to take this with her other meds and her condition.  Please call her back.  Thanks.

## 2011-10-18 NOTE — Telephone Encounter (Signed)
Susan Peck was d/c'd due to breast tenderness, however pt states she can't afford the Inspra and would like to try the spiro again, ok per Ulyess Blossom, PA rx sent in, advised a few doses of Ibuprofen would be ok but she shouldn't be taking it on a regular basis she is agreeable and is going to make an appt w/her pcp to f/u on the hip pain

## 2011-10-25 ENCOUNTER — Ambulatory Visit: Payer: 59 | Admitting: Pulmonary Disease

## 2011-11-10 ENCOUNTER — Encounter (HOSPITAL_COMMUNITY): Payer: Self-pay | Admitting: Emergency Medicine

## 2011-11-10 ENCOUNTER — Emergency Department (HOSPITAL_COMMUNITY)
Admission: EM | Admit: 2011-11-10 | Discharge: 2011-11-10 | Disposition: A | Discharge status: Home / Self Care | Payer: 59 | Attending: Emergency Medicine | Admitting: Emergency Medicine

## 2011-11-10 DIAGNOSIS — J45909 Unspecified asthma, uncomplicated: Secondary | ICD-10-CM | POA: Insufficient documentation

## 2011-11-10 DIAGNOSIS — R1110 Vomiting, unspecified: Secondary | ICD-10-CM | POA: Insufficient documentation

## 2011-11-10 DIAGNOSIS — R102 Pelvic and perineal pain: Secondary | ICD-10-CM

## 2011-11-10 DIAGNOSIS — E669 Obesity, unspecified: Secondary | ICD-10-CM | POA: Insufficient documentation

## 2011-11-10 DIAGNOSIS — M79609 Pain in unspecified limb: Secondary | ICD-10-CM | POA: Insufficient documentation

## 2011-11-10 DIAGNOSIS — R109 Unspecified abdominal pain: Secondary | ICD-10-CM | POA: Insufficient documentation

## 2011-11-10 DIAGNOSIS — D259 Leiomyoma of uterus, unspecified: Secondary | ICD-10-CM

## 2011-11-10 DIAGNOSIS — N949 Unspecified condition associated with female genital organs and menstrual cycle: Secondary | ICD-10-CM | POA: Insufficient documentation

## 2011-11-10 DIAGNOSIS — I10 Essential (primary) hypertension: Secondary | ICD-10-CM | POA: Insufficient documentation

## 2011-11-10 DIAGNOSIS — K219 Gastro-esophageal reflux disease without esophagitis: Secondary | ICD-10-CM | POA: Insufficient documentation

## 2011-11-10 DIAGNOSIS — I5020 Unspecified systolic (congestive) heart failure: Secondary | ICD-10-CM | POA: Insufficient documentation

## 2011-11-10 LAB — BASIC METABOLIC PANEL
BUN: 11 mg/dL (ref 6–23)
CO2: 27 mEq/L (ref 19–32)
Calcium: 9.5 mg/dL (ref 8.4–10.5)
Chloride: 98 mEq/L (ref 96–112)
Creatinine, Ser: 0.56 mg/dL (ref 0.50–1.10)
GFR calc Af Amer: >90 mL/min (ref >90)
GFR calc non Af Amer: >90 mL/min (ref >90)
Glucose, Bld: 182 mg/dL — ABNORMAL HIGH (ref 70–99)
Potassium: 4 mEq/L (ref 3.5–5.1)
Sodium: 135 mEq/L (ref 135–145)

## 2011-11-10 LAB — CBC
HCT: 38.2 % (ref 36.0–46.0)
Hemoglobin: 13.7 g/dL (ref 12.0–15.0)
MCH: 28.8 pg (ref 26.0–34.0)
MCHC: 35.9 g/dL (ref 30.0–36.0)
MCV: 80.4 fL (ref 78.0–100.0)
Platelets: 256 10*3/uL (ref 150–400)
RBC: 4.75 MIL/uL (ref 3.87–5.11)
RDW: 12.3 % (ref 11.5–15.5)
WBC: 10 10*3/uL (ref 4.0–10.5)

## 2011-11-10 LAB — URINALYSIS, ROUTINE W REFLEX MICROSCOPIC
Bilirubin Urine: NEGATIVE
Glucose, UA: NEGATIVE mg/dL
Hgb urine dipstick: NEGATIVE
Ketones, ur: 15 mg/dL — AB
Leukocytes, UA: NEGATIVE
Nitrite: NEGATIVE
Protein, ur: NEGATIVE mg/dL
Specific Gravity, Urine: 1.023 (ref 1.005–1.030)
Urobilinogen, UA: 1 mg/dL (ref 0.0–1.0)
pH: 8 (ref 5.0–8.0)

## 2011-11-10 MED ORDER — ONDANSETRON HCL 4 MG/2ML IJ SOLN
INTRAMUSCULAR | Status: AC
  Administered 2011-11-10: 4 mg via INTRAVENOUS
  Filled 2011-11-10: qty 2

## 2011-11-10 MED ORDER — MORPHINE SULFATE 4 MG/ML IJ SOLN: 6.0000 mg | Freq: Once | INTRAMUSCULAR | Status: DC

## 2011-11-10 MED ORDER — MORPHINE SULFATE 4 MG/ML IJ SOLN
6.0000 mg | Freq: Once | INTRAMUSCULAR | Status: AC
  Administered 2011-11-10: 6 mg via INTRAVENOUS

## 2011-11-10 MED ORDER — PROMETHAZINE HCL 25 MG PO TABS: 25.0000 mg | ORAL_TABLET | Freq: Three times a day (TID) | ORAL | Status: DC | PRN

## 2011-11-10 MED ORDER — ONDANSETRON HCL 4 MG/2ML IJ SOLN
4.0000 mg | Freq: Once | INTRAMUSCULAR | Status: AC
  Administered 2011-11-10: 4 mg via INTRAVENOUS

## 2011-11-10 MED ORDER — HYDROCODONE-ACETAMINOPHEN 10-500 MG PO TABS: 1.0000 | ORAL_TABLET | Freq: Four times a day (QID) | ORAL | Status: AC | PRN

## 2011-11-10 MED ORDER — MORPHINE SULFATE 4 MG/ML IJ SOLN
INTRAMUSCULAR | Status: AC
  Administered 2011-11-10: 6 mg via INTRAVENOUS
  Filled 2011-11-10: qty 2

## 2011-11-10 MED ORDER — ONDANSETRON HCL 4 MG/2ML IJ SOLN
4.0000 mg | Freq: Once | INTRAMUSCULAR | Status: AC
  Administered 2011-11-10: 4 mg via INTRAVENOUS
  Filled 2011-11-10: qty 2

## 2011-11-10 MED ORDER — MORPHINE SULFATE 4 MG/ML IJ SOLN
4.0000 mg | Freq: Once | INTRAMUSCULAR | Status: DC
  Filled 2011-11-10: qty 1

## 2011-11-10 NOTE — ED Provider Notes (Signed)
History     CSN: [id number]  Arrival date & time 11/10/11  1055   First MD Initiated Contact with Patient 11/10/11 1140      Chief Complaint  Patient presents with  . Abdominal Pain  . Leg Pain     HPI The patient presents to the ER with lower pelvic pain. The patient saw her GYN yesterday and was found to need hysterectomy. She has multiple fibroids that have been causing her pain for several weeks. The patient denies vaginal bleeding, weakness, SOB, CP, headache, back pain, and fever. The patient states that she will need clearance by her cardiologist. The patient has had some vomiting with the pain and pain medication. Past Medical History  Diagnosis Date  . Systolic heart failure     May 2011 EF 35-40%, 04/03/11 EF 25-30%  . Hypertension   . Asthma   . Obesity   . GERD (gastroesophageal reflux disease)     Past Surgical History  Procedure Date  . Dilation and curettage of uterus   . Tubal ligation   . Diagnostic laparoscopy     of pelvis    Family History  Problem Relation Age of Onset  . Heart disease Maternal Aunt   . Breast cancer Mother   . Hypertension Maternal Grandmother   . Breast cancer Maternal Aunt     History  Substance Use Topics  . Smoking status: Never Smoker   . Smokeless tobacco: Not on file  . Alcohol Use: No    OB History    Grav Para Term Preterm Abortions TAB SAB Ect Mult Living                  Review of Systems All pertinent positives and negatives reviewed in the history of present illness  Allergies  Review of patient's allergies indicates no known allergies.  Home Medications   Current Outpatient Rx  Name Route Sig Dispense Refill  . ASPIRIN 81 MG PO CHEW Oral Chew 81 mg by mouth daily.    Marland Kitchen CARVEDILOL 25 MG PO TABS Oral Take 25 mg by mouth 2 (two) times daily with a meal.    . FUROSEMIDE 40 MG PO TABS Oral Take 40 mg by mouth daily.     Marland Kitchen LISINOPRIL 20 MG PO TABS Oral Take 1 tablet (20 mg total) by mouth daily. 30  tablet 6  . MOMETASONE FUROATE 50 MCG/ACT NA SUSP Nasal Place 2 sprays into the nose daily.     . OXYCODONE-ACETAMINOPHEN 5-325 MG PO TABS Oral Take 1-2 tablets by mouth every 6 (six) hours as needed. For pain.    Marland Kitchen SPIRONOLACTONE 25 MG PO TABS Oral Take 25 mg by mouth daily.      BP 124/53  Pulse 70  Temp(Src) 98 F (36.7 C) (Oral)  Resp 20  SpO2 97%  Physical Exam  Constitutional: She appears well-developed and well-nourished. She appears distressed.  HENT:  Head: Normocephalic.  Eyes: Pupils are equal, round, and reactive to light.  Neck: Normal range of motion. Neck supple.  Cardiovascular: Normal rate, regular rhythm and normal heart sounds.  Exam reveals no gallop and no friction rub.   No murmur heard. Pulmonary/Chest: Effort normal. No respiratory distress.  Abdominal: Soft. Bowel sounds are normal. She exhibits no distension. There is tenderness. There is no rebound and no guarding.    ED Course  Procedures (including critical care time)  Labs Reviewed  BASIC METABOLIC PANEL - Abnormal; Notable for the following:  Glucose, Bld 182 (*)    All other components within normal limits  URINALYSIS, ROUTINE W REFLEX MICROSCOPIC - Abnormal; Notable for the following:    Ketones, ur 15 (*)    All other components within normal limits  CBC   The patient has been stable here in the ER. She will be referred back to her GYN. The patient will be asked to return here as needed. Will change her pain meds.  The patient is feeling better at this time.   MDM  MDM Reviewed: vitals and nursing note Interpretation: labs            Carlyle Dolly, PA-C 11/10/11 1607

## 2011-11-10 NOTE — ED Notes (Signed)
Pt c/o lower abd pain severe in nature with radiation down right leg; pt sts found out yesterday she has fibroids but must get medically cleared before sx; pt sts given percocet but makes her vomit and she can keep down her other meds; pt tearful

## 2011-11-10 NOTE — Discharge Instructions (Signed)
Return here as needed. Follow up with your GYN as soon as possible °

## 2011-11-13 ENCOUNTER — Ambulatory Visit (HOSPITAL_COMMUNITY)
Admission: RE | Admit: 2011-11-13 | Discharge: 2011-11-13 | Disposition: A | Discharge status: Home / Self Care | Payer: 59 | Source: Ambulatory Visit | Attending: Internal Medicine | Admitting: Internal Medicine

## 2011-11-13 VITALS — BP 116/68 | HR 68 | Wt 211.5 lb

## 2011-11-13 DIAGNOSIS — I5022 Chronic systolic (congestive) heart failure: Secondary | ICD-10-CM | POA: Insufficient documentation

## 2011-11-13 NOTE — Assessment & Plan Note (Addendum)
NYHA II. Volume status stable. Will repeat ECHO in 3 months. If EF remains less than 35% will need to refer for ICD. She medically cleared for hysterectomy. Follow up in 3 months.   Patient seen and examined with Tonye Becket, NP. We discussed all aspects of the encounter. I agree with the assessment and plan as stated above. He HF is stable. Continue current regimen. Reinforced need for daily weights and reviewed use of sliding scale diuretics. F/u with Dr. Shelle Iron re OSA. OK to proceed with hysterectomy from our standpoint.

## 2011-11-13 NOTE — Patient Instructions (Addendum)
Follow up in 3 months with an ECHO  Do the following things EVERYDAY: 1) Weigh yourself in the morning before breakfast. Write it down and keep it in a log. 2) Take your medicines as prescribed 3) Eat low salt foods--Limit salt (sodium) to 2000mg per day.  4) Stay as active as you can everyday 

## 2011-11-13 NOTE — ED Provider Notes (Signed)
Medical screening examination/treatment/procedure(s) were conducted as a shared visit with non-physician practitioner(s) and myself.  I personally evaluated the patient during the encounter On my exam this female with good obstetrics followup, is more comfortable, having already received her analgesics.  Given the improvement in her condition, the absence of notable vital signs changes, the recent demonstration of fibroid pathology, she was discharged in stable condition to follow up with her OB GYN.  Gerhard Munch, MD 11/13/11 [phone]

## 2011-11-13 NOTE — Progress Notes (Signed)
Patient ID: Susan Peck, female   DOB: Nov 15, 1965, 46 y.o.   MRN: [id number] PCP: Dr Lovell Sheehan GYN: Dr Filbert Berthold Pulmonologist: Dr Shelle Iron  HPI:  Susan Peck is a 46 year old African American female with systolic heart failure, HTN, GERD and asthma. Referred for post-hospital f/u in HF clinic.   2004 cardiac cath by Dr Campbell Lerner in [place], [place] with one blockage noted - no stent. EF said to be normal. About 1 year ago at [facility] noted murmur and she was referred to Dr Katrinka Blazing EF 25-30%.  Admitted earlier this year due to acute HF after not taking her meds.BNP was found to be mildly elevated in the 500s.  She was admitted and started on IV Lasix.  She diuresed well.  After she started diuresing  well, her weight dropped approximately 3 pounds.   She is able to ambulate well on room air and her BNP  on day of discharge was down to 150. 46/04/2011  ECHO 25-30% EF  10/26 Sleep study revealed moderate obstructive sleep apnea with desaturations noted.   06/26/2012 ECHO EF 30-35%   She is here for routine follow up. Recent evaluation by her GYN with recommendations for hysterectomy due fibroids. She is requesting medical clearance for surgery. Complains of abdominal pain. Denies SOB/PND/Orhtopnea. Weight at home 207-213. She has required 2 extra Lasix. Complains of breast tenderness but she can not afford to switch to [facility]. She continues to work full time at [employer]. Compliant with all medications.    ROS: All other systems normal except as mentioned in HPI, past medical history and problem list.    Past Medical History  Diagnosis Date  . Systolic heart failure     May 2011 EF 35-40%, 04/03/11 EF 25-30%  . Hypertension   . Asthma   . Obesity   . GERD (gastroesophageal reflux disease)     Current Outpatient Prescriptions  Medication Sig Dispense Refill  . aspirin 81 MG chewable tablet Chew 81 mg by mouth daily.      . carvedilol (COREG) 25 MG tablet Take 25 mg by mouth 2 (two) times daily  with a meal.      . furosemide (LASIX) 40 MG tablet Take 40 mg by mouth daily.       Marland Kitchen HYDROcodone-acetaminophen (LORTAB 10) 10-500 MG per tablet Take 1 tablet by mouth every 6 (six) hours as needed for pain.  15 tablet  0  . lisinopril (PRINIVIL,ZESTRIL) 20 MG tablet Take 1 tablet (20 mg total) by mouth daily.  30 tablet  6  . mometasone (NASONEX) 50 MCG/ACT nasal spray Place 2 sprays into the nose daily.       Marland Kitchen oxyCODONE-acetaminophen (PERCOCET) 5-325 MG per tablet Take 1-2 tablets by mouth every 6 (six) hours as needed. For pain.      . promethazine (PHENERGAN) 25 MG tablet Take 1 tablet (25 mg total) by mouth every 8 (eight) hours as needed for nausea.  10 tablet  0  . spironolactone (ALDACTONE) 25 MG tablet Take 25 mg by mouth daily.      Marland Kitchen DISCONTD: eplerenone (INSPRA) 25 MG tablet Take 1 tablet (25 mg total) by mouth daily.  30 tablet  6     No Known Allergies  History   Social History  . Marital Status: Married    Spouse Name: N/A    Number of Children: Y  . Years of Education: N/A   Occupational History  .    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 Smoking status: Never Smoker   . Smokeless tobacco: Not on file  . Alcohol Use: No  . Drug Use: No  . Sexually Active: Not on file   Other Topics Concern  . Not on file   Social History Narrative   She lives with her husband and son.  She is works for [employer].    Family History  Problem Relation Age of Onset  . Heart disease Maternal Aunt   . Breast cancer Mother   . Hypertension Maternal Grandmother   . Breast cancer Maternal Aunt     PHYSICAL EXAM: Filed Vitals:   11/13/11 1525  BP: 116/68  Pulse: 68  Weight 211 (210) General:  Well appearing. No respiratory difficulty HEENT:  Normal Neck: supple. JVP 6-7 Carotids 2+ bilat; no bruits. No lymphadenopathy. + large nodular goiter Cor: PMI nondisplaced. Regular rate & rhythm. No rubs or murmurs.  Lungs:  clear Abdomen: soft, nontender, obese, nondistended. No hepatosplenomegaly. No bruits or masses. Good bowel sounds. Extremities: no cyanosis, clubbing, rash., no edema R and LLE Neuro: alert & oriented x 3, cranial nerves grossly intact. moves all 4 extremities w/o difficulty. Affect pleasant.   ASSESSMENT & PLAN:

## 2011-11-14 ENCOUNTER — Encounter: Payer: Self-pay | Admitting: Pulmonary Disease

## 2011-11-14 ENCOUNTER — Ambulatory Visit (INDEPENDENT_AMBULATORY_CARE_PROVIDER_SITE_OTHER): Payer: 59 | Admitting: Pulmonary Disease

## 2011-11-14 VITALS — BP 112/70 | HR 71 | Temp 98.0°F | Ht 65.0 in | Wt 213.0 lb

## 2011-11-14 DIAGNOSIS — G4733 Obstructive sleep apnea (adult) (pediatric): Secondary | ICD-10-CM

## 2011-11-14 NOTE — Assessment & Plan Note (Signed)
The patient is adapting to CPAP use, and I think she will do better if we can treat her postnasal drip more effectively.  I have recommended an over-the-counter antihistamine.  We'll also have her medical equipment company increase her pressure to the optimal level of 12 cm.  The patient has upcoming surgery, and I have asked her to take her CPAP mask with her in the event it needs to be used in the recovery room postoperatively.  I see no reason that she cannot have her planned surgery.  I have encouraged her to work aggressively on weight loss, and to followup with me in 6 months.

## 2011-11-14 NOTE — Progress Notes (Signed)
  Subjective:    Patient ID: Susan Peck, female    DOB: 1965-09-27, 46 y.o.   MRN: [id number]  HPI Patient comes in today for followup of her known obstructive sleep apnea.  She has been wearing CPAP more often than not, and for the most part has adapted to the mask and pressure.  She has issues with postnasal drip which then leads to the nights that she is unable to wear CPAP.  She feels that CPAP helps her sleep and daytime alertness whenever she can wear more consistently.  She denies any significant issues with mask fit.   Review of Systems  Constitutional: Positive for chills and diaphoresis. Negative for fever and unexpected weight change.  HENT: Positive for congestion, postnasal drip and sinus pressure. Negative for ear pain, nosebleeds, sore throat, rhinorrhea, sneezing, trouble swallowing and dental problem.   Eyes: Negative for redness and itching.  Respiratory: Positive for shortness of breath. Negative for cough, chest tightness and wheezing.   Cardiovascular: Positive for leg swelling. Negative for palpitations.  Gastrointestinal: Positive for nausea. Negative for vomiting.  Genitourinary: Negative for dysuria.  Musculoskeletal: Positive for joint swelling.  Skin: Negative for rash.  Neurological: Positive for headaches.  Hematological: Does not bruise/bleed easily.  Psychiatric/Behavioral: Negative for dysphoric mood. The patient is not nervous/anxious.        Objective:   Physical Exam Obese female in no acute distress No skin breakdown or pressure necrosis from the CPAP mask Lower extremities with mild edema, no cyanosis Alert and oriented, moves all 4 extremities.       Assessment & Plan:

## 2011-11-14 NOTE — Patient Instructions (Signed)
Try chlorpheniramine 4mg  or 8mg  over the counter at bedtime if needed for nasal drip Will increase your pressure to 12cm Don't forget to take your cpap mask to your surgery.  They may need to use it in the recovery room. Work on weight loss followup with me in 6mos, but call if having issues with cpap.

## 2011-11-16 ENCOUNTER — Telehealth: Payer: Self-pay | Admitting: Internal Medicine

## 2011-11-16 NOTE — Telephone Encounter (Signed)
Dr bensimhon's recent office note faxed to the number provided. Clearance is within that note.

## 2011-11-16 NOTE — Telephone Encounter (Signed)
New problem:  Per Susan Peck , need cardiac clearance for upcoming surgery on 5/2.- Hysterectomy.

## 2011-11-17 ENCOUNTER — Telehealth: Payer: Self-pay | Admitting: Internal Medicine

## 2011-11-17 NOTE — Telephone Encounter (Signed)
LOV x 2 faxed to [facility] @ [phone] 11/17/11/Km

## 2011-11-20 ENCOUNTER — Encounter (HOSPITAL_COMMUNITY): Payer: Self-pay

## 2011-11-20 ENCOUNTER — Encounter (HOSPITAL_COMMUNITY)
Admission: RE | Admit: 2011-11-20 | Discharge: 2011-11-20 | Disposition: A | Discharge status: Home / Self Care | Payer: 59 | Source: Ambulatory Visit | Attending: Obstetrics & Gynecology | Admitting: Obstetrics & Gynecology

## 2011-11-20 HISTORY — DX: Major depressive disorder, single episode, unspecified: F32.9

## 2011-11-20 HISTORY — DX: Shortness of breath: R06.02

## 2011-11-20 HISTORY — DX: Cardiac murmur, unspecified: R01.1

## 2011-11-20 HISTORY — DX: Sleep apnea, unspecified: G47.30

## 2011-11-20 HISTORY — DX: Low back pain, unspecified: M54.50

## 2011-11-20 HISTORY — DX: Anxiety disorder, unspecified: F41.9

## 2011-11-20 HISTORY — DX: Nontoxic goiter, unspecified: E04.9

## 2011-11-20 HISTORY — DX: Other seasonal allergic rhinitis: J30.2

## 2011-11-20 HISTORY — DX: Irritable bowel syndrome, unspecified: K58.9

## 2011-11-20 HISTORY — DX: Low back pain: M54.5

## 2011-11-20 HISTORY — DX: Depression, unspecified: F32.A

## 2011-11-20 LAB — BASIC METABOLIC PANEL
BUN: 15 mg/dL (ref 6–23)
Calcium: 10.1 mg/dL (ref 8.4–10.5)
GFR calc Af Amer: >90 mL/min (ref >90)
GFR calc non Af Amer: >90 mL/min (ref >90)
Glucose, Bld: 174 mg/dL — ABNORMAL HIGH (ref 70–99)
Potassium: 4.1 mEq/L (ref 3.5–5.1)
Sodium: 134 mEq/L — ABNORMAL LOW (ref 135–145)

## 2011-11-20 LAB — CBC
MCH: 27.8 pg (ref 26.0–34.0)
MCHC: 33.9 g/dL (ref 30.0–36.0)
Platelets: 272 10*3/uL (ref 150–400)

## 2011-11-20 LAB — SURGICAL PCR SCREEN: MRSA, PCR: NEGATIVE

## 2011-11-20 NOTE — Pre-Procedure Instructions (Signed)
Reviewed patient's history and medications with Dr Malen Gauze.  Ok to see patient on DOS.

## 2011-11-20 NOTE — Patient Instructions (Addendum)
   Your procedure is scheduled on: Thursday, May 2nd  Enter through the Main Entrance of [facility] at: 8:00am Pick up the phone at the desk and dial [phone] and inform us of your arrival.  Please call this number if you have any problems the morning of surgery: [phone]  Remember: Do not eat food after midnight: Wednesday Do not drink clear liquids after: Wednesday Take these medicines the morning of surgery with a SIP OF WATER: per anesthesia instructions.  Bring inhaler with you to the hospital on day of surgery.  Bring CPAP machine with you to hospital on day of surgery.  Do not wear jewelry, make-up, or FINGER nail polish Do not wear lotions, powders, perfumes or deodorant. Do not shave 48 hours prior to surgery. Do not bring valuables to the hospital. Contacts, dentures or bridgework may not be worn into surgery.  Leave suitcase in the car. After Surgery it may be brought to your room. For patients being admitted to the hospital, checkout time is 11:00am the day of discharge.  Home with husband Thalia Party cell [phone].  Patients discharged on the day of surgery will not be allowed to drive home.     Remember to use your hibiclens as instructed.Please shower with 1/2 bottle the evening before your surgery and the other 1/2 bottle the morning of surgery. Neck down avoiding private area.

## 2011-11-22 ENCOUNTER — Other Ambulatory Visit: Payer: Self-pay | Admitting: Obstetrics & Gynecology

## 2011-11-22 NOTE — H&P (Signed)
Susan Peck is an 46 y.o. female.  Gravida 3 para 3 with a history of severe stomach pain that she states progressively worsened since 2008.  She thinks something might happen during her global endometrial ablation.  She states it seems to start in her stomach where she notes she has to ball up at times and the fetal position and the pain can prevent her from performing her activities of daily living at times.  It used to not be so bad but it has progressively worsened.  It was occurring 3 days of the week.  She can notice it mostly at night.  It can awaken her from sleep.  That is making it very difficult for her to work.  It used to be off and on and pain would come and go almost as quickly as it came but now the pain lasts longer.  She states she deals with this by just lying there at times and she uses Midol and she uses ice bags and she uses Percocet and nothing seems to work.  She states the bowel movements appear to help some but is not help as it has progressively worsened she states.  She compares the pain to menstrual cramping but much worse.  She states it is almost labia in labor she thinks it is higher than where the uterus would be in her normal menstrual cramping.  She states it feels like labor.  She thinks something is misplaced.  Sometimes the pain radiates down her legs.  Contraception BTL.  The initial evaluation for her occurred on 11/08/2011.  She followed up for an ultrasound.  The ultrasound revealed on 11/09/2011: Uterus-9.8 x 5.5 x 5.7 cm small fibroids were noted.  They range in the anterior corpus from 1.4 cm to 0.9 cm and 0.8 cm.  The uterus was tender to palpation during examination.  There appeared to be fluid collections in both cornual regions with extended bilaterally into the fallopian tubes.  They did not proceed through the whole fallopian tube.  It appeared to stop at the site of her BTL. The right ovary 4.1 x 3.6 x 3.0 CM with a 3.6 x 3.1 simple appearing cyst with no  debris septations or suspicious Dopplers. The left ovary was 2.4 x 1.4 x 1.41 cm The cul-de-sac reveals no free fluid Because of the bilateral hematosalpinx and this significant pelvic pain which has progressed in the right ovarian cyst the patient has opted for LAVH BSO of note was or significant multiple medical problems and the need to have medical optimization per her physicians.  11/16/2011 the patient presented to the office for her preoperative history and physical. The patient was consented regarding the specifics of the procedure.  She is well aware of the risks of of surgery in general the risk from anesthesia, the potential risk of scarring and infection that can prolong her healing or possibly cause a readmission for antibiotics, or the possibility of bleeding which might precipitate a blood transfusion.  Blood transfusion would only be given if medically necessary.  Blood transfusion has risks of 1 in 1 million for HIV for each unit, one in 2000 for hepatitis, and other unknown possible diseases, but the biggest risk of transfusion is that her body would rejected when we thought she needed it.  The patient is also aware specific to this procedure that her past medical history will and clots her risk for complications.  We have had her preop optimization with 3 separate physicians  and anesthesia prior to her surgery.  We are hopeful that these complications are  optimized but their risk is certainly not easily predictable.  Also with any entrance into the abdomen no organs inside the abdomen can be damaged.  These include the bowel bladder ureter and other potential blood vessels and nerves within the abdominal/pelvic cavities.  These complications may be recognized at the time of surgery and we would repair at that time and certainly we would not reverse her anesthesia to ask her if we could have someone come to the operating room to help repair the complication.  However these may not be  recognized the time of surgery and the potential is there that 2 weeks later we might need to reoperate to repair any of the above risks.  She is also aware that by removing her uterus we will cure her bleeding.  We are hopeful that the pain will be cured as well as a dose seem to be related to her GYN organs.  However this is not guaranteed.  The potential is there that the scarring in the future could cause more pain.  She is also aware that by removing her ovaries she will be menopausal.  She is prepared for this.  She chose this option because she feels she is very close to menopause and does not think she could potentially tolerate another surgery will want to go through another surgery.  She also is aware of the ovarian cyst and would like to have this removed as well as we are not sure the ovarian cyst is not causing some of her pain as well.  By removing her ovaries and making her menopausal she could potentially develop symptoms of menopause to include hot flashes, night sweats, vaginal dryness, among other symptoms.  The biggest risk is that she develops or becomes at risk for osteoporosis or thinning of the bones.  The most effective and an expensive treatment for all of the symptoms and dealing with the biggest risk of osteoporosis is estrogen replacement therapy.  This is an individualized decision and there is no rash or we will deal with this postoperatively but more the likely this will be an issue postoperatively.  All questions were answered and she wishes to proceed.    Pertinent Gynecological History: Menses: She has been amenorrheic since her global endometrial ablation of 2008 Bleeding: None Contraception: tubal ligation DES exposure: denies Blood transfusions: none Sexually transmitted diseases: Denies a history of sexually transmitted disease to include herpes syphilis gonorrhea Chlamydia HIV and hepatitis Previous GYN Procedures: Bilateral tubal ligation, global endometrial  ablation, diagnostic laparoscopy in 1992 for lysis of adhesions  Last mammogram: normal Date: 11/2010 Last pap: normal Date: 01/2011 OB History: G 3, P 3           3 vaginal deliveries  Menstrual History: Menarche age: Around 46 years old No LMP recorded. Patient has had an ablation.    Past Medical History  Diagnosis Date  . Systolic heart failure     May 2011 EF 35-40%, 04/03/11 EF 25-30%, 06/27/11 EF 30-35%  . Hypertension   . Asthma   . Obesity   . GERD (gastroesophageal reflux disease)   . Sleep apnea   . Heart murmur     dx 2 yrs ago per pt  . Seasonal allergies   . Shortness of breath     occasional - exercise induced  . Diabetes mellitus     recent dx 11/17/11 - started med  11/18/11  . Goiter 07/27/2011    non-neoplastic goiter - fine needle aspiration - benign  . Low back pain     history  . Anxiety     no meds  . Depression     no meds  . IBS (irritable bowel syndrome)     tx with diet per pt   11/13/2011 in the clinic nurse practitioner Dr. Lenor Derrick II her volume status considered stable they will repeat echo in 3 months and she is considered medically cleared for surgery.  Last echocardiogram 06/27/2011 review of the ejection fraction 30-35% that had been as low as 25-30% on 04/03/2011 echocardiogram  Obstructive sleep apnea the patient has seen her pulmonologist Dr. Shelle Iron on 11/14/2011 the recommendations were noted to start an antihistamine optimum months her CPAP pressures to a level of 12 cm and to bring her CPAP mass to her in case they were needed postoperatively.  Dr. Lovell Sheehan saw the patient on 11/17/2011 and considered her medically clear for surgery.  The labs were reviewed as well and is concerned for the new onset diagnosis of diabetes mellitus type 2.  She was apparently started on glyburide 2.5 mg per day.  Of note I called the patient to make sure she is now taken baby aspirin she is still taking her baby aspirin.  I do not think that we'll increase her  risk for surgery but we did briefly discuss the increased risk of bleeding with regard to this.  The same because of her overall health condition there might be best to have some chemical prophylaxis for DVT and I will be reticent to start this.  We would just use mechanical DVT prophylaxis.  I did mention to her the need for increased and early ambulation.  We did discuss that this is a semi-emergent if she has a significant amount of pain as well.  Past Surgical History  Procedure Date  . Tubal ligation   . Diagnostic laparoscopy     of pelvis  . Novasure ablation     10/2005  . Dilation and curettage of uterus 12/2006,  10/2005    hysteroscopy surgery x 2  . Svd     x 3  . Wisdom tooth extraction   . Colonscopy   . Cardiac catherization 2004    [facility], [place] - Dr Campbell Lerner    Family History  Problem Relation Age of Onset  . Heart disease Maternal Aunt   . Breast cancer Mother   . Hypertension Maternal Grandmother   . Breast cancer Maternal Aunt     Social History:  reports that she has never smoked. She has never used smokeless tobacco. She reports that she does not drink alcohol or use illicit drugs.  Allergies: No Known Allergies   (Not in a hospital admission)  Review of Systems  Constitutional: Positive for weight loss. Negative for fever, chills and diaphoresis.  HENT: Negative for hearing loss, ear pain, congestion, sore throat, neck pain and tinnitus.   Eyes: Negative for blurred vision, double vision and discharge.  Respiratory: Negative for cough, shortness of breath and wheezing.   Cardiovascular: Negative for chest pain, palpitations, orthopnea and leg swelling.  Gastrointestinal: Positive for nausea and abdominal pain. Negative for heartburn, vomiting, diarrhea, blood in stool and melena.  Genitourinary: Negative for dysuria, urgency, frequency and hematuria.  Musculoskeletal: Negative for myalgias and joint pain.  Skin: Negative for itching and rash.    Neurological: Negative for dizziness, tingling, focal weakness, seizures, loss of  consciousness and headaches.  Endo/Heme/Allergies: Does not bruise/bleed easily.  Psychiatric/Behavioral: Negative for depression.    There were no vitals taken for this visit. Physical Exam  Constitutional: She is oriented to person, place, and time. She appears well-developed and well-nourished.  Non-toxic appearance. She does not appear ill. No distress.       No acute distress above ideal body weight  HENT:  Head: Normocephalic and atraumatic.  Right Ear: Hearing normal.  Left Ear: Hearing normal.  Nose: Nose normal.  Mouth/Throat: Uvula is midline, oropharynx is clear and moist and mucous membranes are normal. No oropharyngeal exudate or tonsillar abscesses.  Eyes: Conjunctivae and EOM are normal. Pupils are equal, round, and reactive to light. No scleral icterus.  Neck: Normal range of motion. Neck supple. No tracheal deviation present. Thyromegaly present.       She states that her thyroid has been evaluated with a biopsy which was consistent with a nonneoplastic process.  07/27/2011  Cardiovascular: Normal rate, regular rhythm and normal heart sounds.   No murmur heard. Respiratory: Effort normal and breath sounds normal. No respiratory distress. She has no wheezes. She has no rales. She exhibits no tenderness.  GI: Soft. Bowel sounds are normal. She exhibits distension. There is no rebound and no guarding. Hernia confirmed negative in the right inguinal area and confirmed negative in the left inguinal area.       Abdomen is above plane and the BTL and umbilical scars appreciated.  The pain seems to arise above the symphysis pubis with deep palpation.  Otherwise nonacute abdomen nontender no hepatosplenomegaly  Genitourinary: Rectum normal, vagina normal and uterus normal. There is no rash, tenderness, lesion or injury on the right labia. There is no rash, tenderness, lesion or injury on the left labia.  Uterus is not tender. Right adnexum displays tenderness. Left adnexum displays tenderness.       Normal except for pain that arises from the uterus and there is minimal cervical motion tenderness but bimanual exam reveals.  Tender adnexa and corneal regions and fundus of the uterus  Musculoskeletal: Normal range of motion. She exhibits no edema.  Lymphadenopathy:       Head (right side): No submental, no submandibular, no tonsillar, no preauricular and no posterior auricular adenopathy present.       Head (left side): No submental, no submandibular, no tonsillar, no preauricular and no posterior auricular adenopathy present.       Right: No inguinal adenopathy present.       Left: No inguinal adenopathy present.  Neurological: She is alert and oriented to person, place, and time. She has normal reflexes. No cranial nerve deficit. Coordination normal.  Skin: Skin is warm, dry and intact. No rash noted.       No ulcers noted either  Psychiatric: She has a normal mood and affect. Her behavior is normal. Judgment and thought content normal.    No results found for this or any previous visit (from the past 24 hour(s)).  No results found.  Assessment/Plan:  Pelvic pain  Hematosalpinx  History of global endometrial ablation  History of BTL  Right ovarian cyst  New onset diabetes mellitus  Obstructive sleep apnea  Systolic heart failure  History of cardiac cath last in 2004 1 blockage noted no stent  History of asthma  History of hypertension  BMI 35  History of IBS  History of adenomyosis per MRI in 2009  Family history of breast cancer  Plan:  Proceed with LAVH BSO,  early ambulation,   include anesthesia throughout her care,   consider placement in adult intensive care for her postoperative care.  I feel like there is room for an LAVH BSO.    We'll plan on doing cystoscopy post procedure and second look laparoscopy.    We'll plan on using the [facility]  technique for entry.  Myleigh Amara H. 11/22/2011, 6:46 PM

## 2011-11-23 ENCOUNTER — Encounter (HOSPITAL_COMMUNITY)
Admission: RE | Disposition: A | Discharge status: Home / Self Care | Payer: Self-pay | Source: Ambulatory Visit | Attending: Obstetrics & Gynecology

## 2011-11-23 ENCOUNTER — Inpatient Hospital Stay (HOSPITAL_COMMUNITY)
Admission: RE | Admit: 2011-11-23 | Discharge: 2011-11-24 | DRG: 743 | Disposition: A | Discharge status: Home / Self Care | Payer: 59 | Source: Ambulatory Visit | Attending: Obstetrics & Gynecology | Admitting: Obstetrics & Gynecology

## 2011-11-23 ENCOUNTER — Encounter (HOSPITAL_COMMUNITY): Payer: Self-pay | Admitting: *Deleted

## 2011-11-23 ENCOUNTER — Encounter: Payer: Self-pay | Admitting: Obstetrics & Gynecology

## 2011-11-23 ENCOUNTER — Inpatient Hospital Stay (HOSPITAL_COMMUNITY): Payer: 59 | Admitting: Anesthesiology

## 2011-11-23 ENCOUNTER — Encounter (HOSPITAL_COMMUNITY): Payer: Self-pay | Admitting: Anesthesiology

## 2011-11-23 DIAGNOSIS — Z01812 Encounter for preprocedural laboratory examination: Secondary | ICD-10-CM

## 2011-11-23 DIAGNOSIS — D259 Leiomyoma of uterus, unspecified: Secondary | ICD-10-CM | POA: Diagnosis present

## 2011-11-23 DIAGNOSIS — N831 Corpus luteum cyst of ovary, unspecified side: Secondary | ICD-10-CM | POA: Diagnosis present

## 2011-11-23 DIAGNOSIS — N83209 Unspecified ovarian cyst, unspecified side: Secondary | ICD-10-CM | POA: Diagnosis present

## 2011-11-23 DIAGNOSIS — N949 Unspecified condition associated with female genital organs and menstrual cycle: Secondary | ICD-10-CM | POA: Diagnosis present

## 2011-11-23 DIAGNOSIS — Z90710 Acquired absence of both cervix and uterus: Secondary | ICD-10-CM

## 2011-11-23 DIAGNOSIS — N8000 Endometriosis of the uterus, unspecified: Secondary | ICD-10-CM | POA: Diagnosis present

## 2011-11-23 DIAGNOSIS — Z01818 Encounter for other preprocedural examination: Secondary | ICD-10-CM

## 2011-11-23 DIAGNOSIS — I10 Essential (primary) hypertension: Secondary | ICD-10-CM | POA: Diagnosis present

## 2011-11-23 DIAGNOSIS — N838 Other noninflammatory disorders of ovary, fallopian tube and broad ligament: Principal | ICD-10-CM | POA: Diagnosis present

## 2011-11-23 HISTORY — PX: CYSTOSCOPY: SHX5120

## 2011-11-23 HISTORY — PX: LAPAROSCOPIC ASSISTED VAGINAL HYSTERECTOMY: SHX5398

## 2011-11-23 HISTORY — PX: SALPINGOOPHORECTOMY: SHX82

## 2011-11-23 LAB — GLUCOSE, CAPILLARY: Glucose-Capillary: 180 mg/dL — ABNORMAL HIGH (ref 70–99)

## 2011-11-23 LAB — ABO/RH: ABO/RH(D): O POS

## 2011-11-23 SURGERY — HYSTERECTOMY, ABDOMINAL
Anesthesia: General

## 2011-11-23 SURGERY — HYSTERECTOMY, VAGINAL, LAPAROSCOPY-ASSISTED
Anesthesia: General | Site: Bladder | Wound class: Clean Contaminated

## 2011-11-23 MED ORDER — POTASSIUM CHLORIDE IN NACL 20-0.45 MEQ/L-% IV SOLN
INTRAVENOUS | Status: DC
  Filled 2011-11-23 (×2): qty 1000

## 2011-11-23 MED ORDER — LIDOCAINE HCL (CARDIAC) 20 MG/ML IV SOLN
INTRAVENOUS | Status: AC
  Filled 2011-11-23: qty 5

## 2011-11-23 MED ORDER — ZOLPIDEM TARTRATE 5 MG PO TABS: 5.0000 mg | ORAL_TABLET | Freq: Every evening | ORAL | Status: DC | PRN

## 2011-11-23 MED ORDER — ONDANSETRON HCL 4 MG/2ML IJ SOLN: 4.0000 mg | Freq: Four times a day (QID) | INTRAMUSCULAR | Status: DC | PRN

## 2011-11-23 MED ORDER — ROCURONIUM BROMIDE 100 MG/10ML IV SOLN
INTRAVENOUS | Status: DC | PRN
  Administered 2011-11-23: 50 mg via INTRAVENOUS

## 2011-11-23 MED ORDER — ONDANSETRON HCL 4 MG PO TABS: 4.0000 mg | ORAL_TABLET | Freq: Four times a day (QID) | ORAL | Status: DC | PRN

## 2011-11-23 MED ORDER — METOCLOPRAMIDE HCL 5 MG/ML IJ SOLN
INTRAMUSCULAR | Status: AC
  Administered 2011-11-23: 10 mg via INTRAVENOUS
  Filled 2011-11-23: qty 2

## 2011-11-23 MED ORDER — ONDANSETRON HCL 4 MG/2ML IJ SOLN
INTRAMUSCULAR | Status: DC | PRN
  Administered 2011-11-23: 4 mg via INTRAVENOUS

## 2011-11-23 MED ORDER — PHENYLEPHRINE HCL 10 MG/ML IJ SOLN
INTRAMUSCULAR | Status: DC | PRN
  Administered 2011-11-23 (×2): .04 mg via INTRAVENOUS

## 2011-11-23 MED ORDER — ALUM & MAG HYDROXIDE-SIMETH 200-200-20 MG/5ML PO SUSP
30.0000 mL | ORAL | Status: DC | PRN
  Filled 2011-11-23: qty 30

## 2011-11-23 MED ORDER — BUPIVACAINE HCL 0.5 % IJ SOLN
INTRAMUSCULAR | Status: DC | PRN
  Administered 2011-11-23: 10 mL

## 2011-11-23 MED ORDER — INDIGOTINDISULFONATE SODIUM 8 MG/ML IJ SOLN
INTRAMUSCULAR | Status: DC | PRN
  Administered 2011-11-23: 5 mL via INTRAVENOUS

## 2011-11-23 MED ORDER — LACTATED RINGERS IV SOLN
INTRAVENOUS | Status: DC
  Administered 2011-11-23: 11:00:00 via INTRAVENOUS
  Administered 2011-11-23 (×2): 125 mL/h via INTRAVENOUS

## 2011-11-23 MED ORDER — GLYCOPYRROLATE 0.2 MG/ML IJ SOLN
INTRAMUSCULAR | Status: DC | PRN
  Administered 2011-11-23: 0.6 mg via INTRAVENOUS

## 2011-11-23 MED ORDER — MENTHOL 3 MG MT LOZG: 1.0000 | LOZENGE | OROMUCOSAL | Status: DC | PRN

## 2011-11-23 MED ORDER — MIDAZOLAM HCL 5 MG/5ML IJ SOLN
INTRAMUSCULAR | Status: DC | PRN
  Administered 2011-11-23: 2 mg via INTRAVENOUS

## 2011-11-23 MED ORDER — PROPOFOL 10 MG/ML IV EMUL
INTRAVENOUS | Status: DC | PRN
  Administered 2011-11-23: 200 mg via INTRAVENOUS

## 2011-11-23 MED ORDER — VASOPRESSIN 20 UNIT/ML IJ SOLN
INTRAVENOUS | Status: DC | PRN
  Administered 2011-11-23: 11:00:00 via INTRAMUSCULAR

## 2011-11-23 MED ORDER — ESTRADIOL 0.1 MG/GM VA CREA
TOPICAL_CREAM | VAGINAL | Status: DC | PRN
  Administered 2011-11-23: 1 via VAGINAL

## 2011-11-23 MED ORDER — MORPHINE SULFATE 4 MG/ML IJ SOLN
1.0000 mg | INTRAMUSCULAR | Status: DC | PRN
Start: 2011-11-23 — End: 2011-11-24
  Administered 2011-11-23: 2 mg via INTRAVENOUS
  Filled 2011-11-23: qty 1

## 2011-11-23 MED ORDER — PHENYLEPHRINE 40 MCG/ML (10ML) SYRINGE FOR IV PUSH (FOR BLOOD PRESSURE SUPPORT)
PREFILLED_SYRINGE | INTRAVENOUS | Status: AC
  Filled 2011-11-23: qty 5

## 2011-11-23 MED ORDER — INDIGOTINDISULFONATE SODIUM 8 MG/ML IJ SOLN
INTRAMUSCULAR | Status: AC
  Filled 2011-11-23: qty 5

## 2011-11-23 MED ORDER — ESTRADIOL 0.1 MG/GM VA CREA
TOPICAL_CREAM | VAGINAL | Status: AC
  Filled 2011-11-23: qty 42.5

## 2011-11-23 MED ORDER — HYDROMORPHONE HCL PF 1 MG/ML IJ SOLN
0.2500 mg | INTRAMUSCULAR | Status: DC | PRN
  Administered 2011-11-23 (×4): 0.5 mg via INTRAVENOUS

## 2011-11-23 MED ORDER — LIDOCAINE HCL (CARDIAC) 20 MG/ML IV SOLN
INTRAVENOUS | Status: DC | PRN
  Administered 2011-11-23: 100 mg via INTRAVENOUS

## 2011-11-23 MED ORDER — 0.9 % SODIUM CHLORIDE (POUR BTL) OPTIME
TOPICAL | Status: DC | PRN
  Administered 2011-11-23: 1000 mL

## 2011-11-23 MED ORDER — PROPOFOL 10 MG/ML IV EMUL
INTRAVENOUS | Status: AC
  Filled 2011-11-23: qty 20

## 2011-11-23 MED ORDER — ACETAMINOPHEN 325 MG PO TABS: 650.0000 mg | ORAL_TABLET | ORAL | Status: DC | PRN

## 2011-11-23 MED ORDER — ROCURONIUM BROMIDE 50 MG/5ML IV SOLN
INTRAVENOUS | Status: AC
  Filled 2011-11-23: qty 1

## 2011-11-23 MED ORDER — MEPERIDINE HCL 25 MG/ML IJ SOLN: 6.2500 mg | INTRAMUSCULAR | Status: DC | PRN

## 2011-11-23 MED ORDER — STERILE WATER FOR IRRIGATION IR SOLN
Status: DC | PRN
  Administered 2011-11-23: 1000 mL via INTRAVESICAL

## 2011-11-23 MED ORDER — OXYCODONE-ACETAMINOPHEN 5-325 MG PO TABS
1.0000 | ORAL_TABLET | ORAL | Status: DC | PRN
  Administered 2011-11-24: 2 via ORAL
  Filled 2011-11-23: qty 2
  Filled 2011-11-23: qty 1

## 2011-11-23 MED ORDER — KETOROLAC TROMETHAMINE 30 MG/ML IJ SOLN
INTRAMUSCULAR | Status: AC
  Filled 2011-11-23: qty 1

## 2011-11-23 MED ORDER — VASOPRESSIN 20 UNIT/ML IJ SOLN
INTRAMUSCULAR | Status: AC
  Filled 2011-11-23: qty 1

## 2011-11-23 MED ORDER — MAGNESIUM HYDROXIDE 400 MG/5ML PO SUSP
30.0000 mL | Freq: Every day | ORAL | Status: DC | PRN
  Administered 2011-11-24: 30 mL via ORAL

## 2011-11-23 MED ORDER — FENTANYL CITRATE 0.05 MG/ML IJ SOLN
INTRAMUSCULAR | Status: AC
  Filled 2011-11-23: qty 2

## 2011-11-23 MED ORDER — HYDROMORPHONE HCL PF 1 MG/ML IJ SOLN
INTRAMUSCULAR | Status: AC
  Administered 2011-11-23: 0.5 mg via INTRAVENOUS
  Filled 2011-11-23: qty 1

## 2011-11-23 MED ORDER — FENTANYL CITRATE 0.05 MG/ML IJ SOLN
INTRAMUSCULAR | Status: DC | PRN
  Administered 2011-11-23: 25 ug via INTRAVENOUS
  Administered 2011-11-23: 50 ug via INTRAVENOUS
  Administered 2011-11-23: 25 ug via INTRAVENOUS
  Administered 2011-11-23 (×4): 50 ug via INTRAVENOUS

## 2011-11-23 MED ORDER — METOCLOPRAMIDE HCL 5 MG/ML IJ SOLN
10.0000 mg | Freq: Once | INTRAMUSCULAR | Status: AC | PRN
  Administered 2011-11-23: 10 mg via INTRAVENOUS

## 2011-11-23 MED ORDER — KETOROLAC TROMETHAMINE 30 MG/ML IJ SOLN
INTRAMUSCULAR | Status: DC | PRN
  Administered 2011-11-23: 30 mg via INTRAVENOUS

## 2011-11-23 MED ORDER — BUPIVACAINE HCL (PF) 0.5 % IJ SOLN
INTRAMUSCULAR | Status: AC
  Filled 2011-11-23: qty 30

## 2011-11-23 MED ORDER — MIDAZOLAM HCL 2 MG/2ML IJ SOLN
INTRAMUSCULAR | Status: AC
  Filled 2011-11-23: qty 2

## 2011-11-23 MED ORDER — NEOSTIGMINE METHYLSULFATE 1 MG/ML IJ SOLN
INTRAMUSCULAR | Status: DC | PRN
  Administered 2011-11-23: 4 mg via INTRAVENOUS

## 2011-11-23 MED ORDER — FENTANYL CITRATE 0.05 MG/ML IJ SOLN
INTRAMUSCULAR | Status: AC
  Filled 2011-11-23: qty 5

## 2011-11-23 MED ORDER — SODIUM CHLORIDE 0.45 % IV SOLN
INTRAVENOUS | Status: DC
  Administered 2011-11-23: via INTRAVENOUS
  Filled 2011-11-23 (×4): qty 1000

## 2011-11-23 MED ORDER — CEFAZOLIN SODIUM-DEXTROSE 2-3 GM-% IV SOLR
2.0000 g | INTRAVENOUS | Status: AC
  Administered 2011-11-23: 2 g via INTRAVENOUS
  Filled 2011-11-23: qty 50

## 2011-11-23 SURGICAL SUPPLY — 29 items
BANDAGE ADHESIVE 1X3 (GAUZE/BANDAGES/DRESSINGS) ×8 IMPLANT
BLADE SURG 15 STRL LF C SS BP (BLADE) ×3 IMPLANT
BLADE SURG 15 STRL SS (BLADE) ×1
CLOTH BEACON ORANGE TIMEOUT ST (SAFETY) ×4 IMPLANT
CONT PATH 16OZ SNAP LID 3702 (MISCELLANEOUS) ×4 IMPLANT
COVER TABLE BACK 60X90 (DRAPES) ×4 IMPLANT
DECANTER SPIKE VIAL GLASS SM (MISCELLANEOUS) ×4 IMPLANT
DRSG COVADERM PLUS 2X2 (GAUZE/BANDAGES/DRESSINGS) ×4 IMPLANT
ELECT REM PT RETURN 9FT ADLT (ELECTROSURGICAL) ×8
ELECTRODE REM PT RTRN 9FT ADLT (ELECTROSURGICAL) ×6 IMPLANT
GAUZE PACKING 2X5 YD STERILE (GAUZE/BANDAGES/DRESSINGS) ×8 IMPLANT
GAUZE SPONGE 4X4 16PLY XRAY LF (GAUZE/BANDAGES/DRESSINGS) ×4 IMPLANT
GOWN PREVENTION PLUS LG XLONG (DISPOSABLE) ×20 IMPLANT
GOWN PREVENTION PLUS XLARGE (GOWN DISPOSABLE) ×8 IMPLANT
LIGASURE LAP ATLAS 10MM 37CM (INSTRUMENTS) ×4 IMPLANT
PACK LAVH (CUSTOM PROCEDURE TRAY) ×4 IMPLANT
PROTECTOR NERVE ULNAR (MISCELLANEOUS) ×4 IMPLANT
SET CYSTO W/LG BORE CLAMP LF (SET/KITS/TRAYS/PACK) ×4 IMPLANT
STRIP CLOSURE SKIN 1/4X4 (GAUZE/BANDAGES/DRESSINGS) ×4 IMPLANT
SUT VIC AB 2-0 CT1 (SUTURE) ×36 IMPLANT
SUT VICRYL 0 UR6 27IN ABS (SUTURE) ×8 IMPLANT
SUT VICRYL 2 0 18  TIES (SUTURE) ×1
SUT VICRYL 2 0 18 TIES (SUTURE) ×3 IMPLANT
SUT VICRYL 4-0 PS2 18IN ABS (SUTURE) ×4 IMPLANT
TOWEL OR 17X24 6PK STRL BLUE (TOWEL DISPOSABLE) ×12 IMPLANT
TRAY FOLEY CATH 14FR (SET/KITS/TRAYS/PACK) ×4 IMPLANT
TROCAR HASSON GELL 12X100 (TROCAR) ×4 IMPLANT
TROCAR XCEL NON-BLD 11X100MML (ENDOMECHANICALS) ×8 IMPLANT
WARMER LAPAROSCOPE (MISCELLANEOUS) ×4 IMPLANT

## 2011-11-23 NOTE — Transfer of Care (Signed)
Immediate Anesthesia Transfer of Care Note  Patient: Susan Peck  Procedure(s) Performed: Procedure(s) (LRB): LAPAROSCOPIC ASSISTED VAGINAL HYSTERECTOMY (N/A) CYSTOSCOPY (N/A) SALPINGO OOPHERECTOMY (Bilateral)  Patient Location: PACU  Anesthesia Type: General  Level of Consciousness: awake, alert  and sedated  Airway & Oxygen Therapy: Patient Spontanous Breathing and Patient connected to nasal cannula oxygen  Post-op Assessment: Report given to PACU RN and Post -op Vital signs reviewed and stable  Post vital signs: Reviewed and stable  Complications: No apparent anesthesia complications

## 2011-11-23 NOTE — Progress Notes (Signed)
Interval note  Patient aware of plan and agrees with plan of care for LAVH BSO and cystoscopy.  All questions were answered.

## 2011-11-23 NOTE — Anesthesia Procedure Notes (Signed)
Procedure Name: Intubation Date/Time: 11/23/2011 9:53 AM Performed by: Dajuan Turnley, Jannet Askew Pre-anesthesia Checklist: Patient identified, Patient being monitored, Emergency Drugs available, Timeout performed and Suction available Patient Re-evaluated:Patient Re-evaluated prior to inductionOxygen Delivery Method: Circle system utilized Preoxygenation: Pre-oxygenation with 100% oxygen Intubation Type: IV induction Ventilation: Two handed mask ventilation required Laryngoscope Size: Mac Grade View: Grade I Tube type: Oral Tube size: 7.0 mm Number of attempts: 2 Placement Confirmation: ETT inserted through vocal cords under direct vision,  breath sounds checked- equal and bilateral and positive ETCO2 Secured at: 21 cm Dental Injury: Teeth and Oropharynx as per pre-operative assessment

## 2011-11-23 NOTE — Anesthesia Preprocedure Evaluation (Addendum)
Anesthesia Evaluation  Patient identified by MRN, date of birth, ID band Patient awake    Reviewed: Allergy & Precautions, H&P , NPO status , Patient's Chart, lab work & pertinent test results, reviewed documented beta blocker date and time   Airway Mallampati: III TM Distance: >3 FB Neck ROM: full    Dental No notable dental hx. (+) Teeth Intact   Pulmonary shortness of breath and with exertion, asthma , sleep apnea and Continuous Positive Airway Pressure Ventilation ,  breath sounds clear to auscultation  Pulmonary exam normal       Cardiovascular hypertension, Pt. on medications and Pt. on home beta blockers + Valvular Problems/Murmurs MR and MVP Rhythm:regular Rate:Normal     Neuro/Psych PSYCHIATRIC DISORDERS Anxiety Depression negative neurological ROS     GI/Hepatic Neg liver ROS, GERD-  Medicated and Controlled,IBS   Endo/Other  Diabetes mellitus-, Well Controlled, Type 2, Oral Hypoglycemic AgentsMorbid obesity  Renal/GU negative Renal ROS  negative genitourinary   Musculoskeletal negative musculoskeletal ROS (+)   Abdominal Normal abdominal exam  (+)   Peds  Hematology negative hematology ROS (+)   Anesthesia Other Findings   Reproductive/Obstetrics negative OB ROS                         Anesthesia Physical Anesthesia Plan  ASA: III  Anesthesia Plan: General ETT   Post-op Pain Management:    Induction:   Airway Management Planned:   Additional Equipment:   Intra-op Plan:   Post-operative Plan:   Informed Consent: I have reviewed the patients History and Physical, chart, labs and discussed the procedure including the risks, benefits and alternatives for the proposed anesthesia with the patient or authorized representative who has indicated his/her understanding and acceptance.   Dental Advisory Given  Plan Discussed with: Anesthesiologist, CRNA and Surgeon  Anesthesia  Plan Comments:         Anesthesia Quick Evaluation

## 2011-11-23 NOTE — Op Note (Signed)
11/23/2011  6:30 PM  PATIENT:  Susan Peck  46 y.o. female  PRE-OPERATIVE DIAGNOSIS:  hematosalpinx bilaterally  pelvic pain History of BTL History of global endometrial ablation Right ovarian cyst Obesity Hypertension Systolic heart failure      POST-OPERATIVE DIAGNOSIS:  Same  PROCEDURE:  Procedure(s): LAPAROSCOPIC ASSISTED VAGINAL HYSTERECTOMY CYSTOSCOPY SALPINGO OOPHERECTOMY  SURGEON:  Waverly Ferrari. Christell Constant M.D. PHYSICIAN ASSISTANT: Fortino Sic, MD  ASSISTANTS: Maggie and V., and several others during lunch breaks.   ANESTHESIA:   general  EBL:  Total I/O In: 2700 [I.V.:2700] Out: 675 [Urine:375; Blood:300]  BLOOD ADMINISTERED:none  DRAINS: Urinary Catheter (Foley)   LOCAL MEDICATIONS USED:  MARCAINE   half percent without epi approximately 10 mL injected at each of the 3 surgical wound sites  SPECIMEN:  Source of Specimen:  Uterus with attached cervix and attached bilateral tubes and ovaries  DISPOSITION OF SPECIMEN:  PATHOLOGY  COUNTS:  YES  TOURNIQUET:  * No tourniquets in log *  DICTATION: .Dragon Dictation  PLAN OF CARE: Admit for overnight observation  PATIENT DISPOSITION:  PACU - hemodynamically stable.   Delay start of Pharmacological VTE agent (>24hrs) due to surgical blood loss or risk of bleeding:  {I do not believe we need to do anymore the mechanical DVT prophylaxis-there will be early ambulation as well.   Findings: 1.  Diagnostic laparoscopy revealed a normal upper abdomen normal gallbladder edge normal liver normal stomach.  It also revealed normal left inguinal ring the right inguinal ring did appear to be weakened without gross evidence of a large hernia.  The uterus was normal size.  Both salpinx were consistent with hematosalpinx that were noted on ultrasound previously this extended from the corneal region to the bilateral tubal ligation site.  This occurred bilaterally.  Both ovaries appear to be within normal limits there was no  gross evidence of an ovarian cyst on either ovary.  There was no evidence of endometriosis.  There was minimal scar tissue of one omental adhesion to the anterior abdominal wall just to the right of the umbilical incision.  This was bluntly lysed. 2.  The vaginal portion of the procedure did not reveal any abnormalities.   3.  The cystoscopy revealed normal bladder mucosa and good bilateral ureteral reflux of blue indigo carmine dye bilaterally. 4.  Second look laparoscopy revealed good hemostasis.  It also revealed good peristalsis of both ureters.  The ureters appeared to be clear of suture in the operative field. 5.  Good fascial integrity was noted at the umbilical wound.   Indications: The patient presents to the office approximately 2 weeks ago with significant pelvic pain that was preventing her from performing her activities of daily living.  She is a Production designer, theatre/television/film at [employer].  The patient was noted to have a global endometrial ablation in 2007.  She had a bilateral tubal ligation prior to that as well.  She had been amenorrheic since that time.  However her pain had continued to progressively worsened.  It failed hormonal manipulations in the past and pain management in the past.  She had been told apparently by 2 separate physicians initiated we hysterectomy.  Prior CT scan in other imaging did reveal a much smaller amount of salpinx or hydrosalpinx.  However on ultrasound this was enlarged from the corneal region through the fallopian tube to the site of her prior tubal ligation.  This seemed to be the root cause of her pain as well.  Coincidentally she had a  right ovarian cyst and was apparently physiologic.  The patient had multiple medical problems and did undergo clearance with her pulmonologist Dr. Cora Daniels and her cardiologist Dr. Katrinka Blazing and Mackie Pai nurse practitioner and her primary care provider Dr. Lovell Sheehan.  Patient is highly motivated to have this done.  She was made aware of the risks  benefits and alternatives the procedure and she wished to proceed.  She is also aware the fact she would be menopausal and she understood that this may presents with special problems in the future for her to do with her including the quality of life symptoms of mood lability hot flashes night sweats vaginal dryness.  Then to also include insomnia.  She is also made aware of the major risk of osteoporosis.  And was made aware of potential fresh replacement therapy in the future.  All questions answered she wished to proceed.  She was seen the morning of surgery and was without questions and wished to proceed.   Description of procedure:  After the patient was properly identified in the recording and the patient was admitted to the operating room.  She was then placed on the OR table in supine position.  She was then positioned in the modified lithotomy position with the use of bucket stirrups.  She reported no discomfort.  Patient then underwent general anesthetic induction with intubation.  At this point active time out was performed to match patient with procedure, and sure that the plan of care was agreed upon by all members of the operating team, and sure that the prophylactic antibiotics have been given, and in sure that the DVT prophylaxis per mechanical SCDs had been initiated.  The vagina was then prepped as was the perineum with chlorhexidine solution.  The abdomen was prepped with DuraPrep.  The speculum was inserted into the vagina and a single-tooth tenaculum was placed on the anterior lip of the cervix and a Cohen uterine manipulator was inserted into the cervix and adjoined to the to the tenaculum.  Foley catheter was inserted.  The gloves were removed and the hands were prepped again.  The laparoscopic portion of the procedure began with placement of Allis clamps on the periumbilical skin elevating this and creating a 1.5 cm infraumbilical vertical skin incision.  The subcutaneous tissue was  transected to the anterior fascial sheath and scar could be identified.  This is elevated with 2 Cooper clamps divided in the midline.  2 UR 6-0 Vicryl stay sutures were then placed bilaterally.  Instruments and the abdomen was easily performed at this point to the peritoneum bluntly.  The is on 10 mm port was then inserted and secured with the stay sutures.  A pneumoperitoneum was then created with CO2 gas.  This was placed on maximum flow temperature minute and the intra-abdominal pressure was no greater than 15 mmHg noted throughout the entire procedure without alarms.  Above findings were noted.  2 more ports were placed a left and right McBurney's point with a stab wound with the knife blade approximately 1 cm and then direct insertion of the Excel blade less trocar-10/11 mm.  These were inserted under direct visualization with transillumination of the anterior abdominal wall.  At this point the toothed graspers/alligator graspers and the 10 mm LigaSure device was then utilized.  The pelvic anatomy was visualized at this point.  The ureters were noted to peristalse well posteriorly to the planned operative field.  The left adnexa was placed on traction medially.  The left  infundibulopelvic ligament was then secured through the broad ligament through the round ligament through the parametrium to the upper reach of the cardinal ligament.  The bladder was noted to be well away from the operative field at this point.  The peritoneum was scored in the midline of the cervix with the LigaSure device.  The same was performed on the contralateral side.  Of note was that the LigaSure device did not open ended, the bladder flap on the right.  The pneumoperitoneum was relieved camera removed from the abdomen and a sheet was placed on the abdomen.  The vaginal portion of the procedure began with injection of dilute Pitressin 20 units in 50 mL of saline.  This was injected at 12:00, 2:00, 6:00, and 10:00.  The mucosa  was then divided at the cervicovaginal junction in a circumferential pattern with a reverse chevron posteriorly.  With a combination of sharp and blunt dissection the anterior cul-de-sac was entered easily.  Small sub-mucosal vessel was bleeding at this point where majority of the blood loss occurred.  This was finally secured with a single tie of the right bladder pillar.  The Briski Navratil retractors and the Heaney clamps and 2-0 Vicryl on a CT1 were utilized to secure hemostasis through the left uterosacral right uterosacral left cardinal ligament and then the remaining portion of the cardinal ligament on each side.  The last pedicle was tied with a fore and aft stitch after a single tie with flashing technique.  Of note, the posterior cul-de-sac was not entered immediately as the plane was into the adventitia of the cervix.  Once the posterior cul-de-sac could be entered the long duckbilled speculum was placed and this allowed for completion of the procedure easily after the uterosacral pedicle had been created on the left and right side first.  Specimen was removed and good hemostasis was noted.  The cuff was then closed with modified Richardson angle stitch.  The cuff was then closed with figure-of-eight sutures in the midline.  All issues and removed from the vagina and the vagina was irrigated.  The Foley catheter was removed and cystoscopy began.  A 30 fore- oblique cystoscope was then inserted into the urethra with normal saline as the distending media.  The above findings were noted.  This was then removed and the Foley catheter reinserted.  Estrace cream was then placed on a 2 inch vaginal packing gauze and this was then inserted with the Singley forceps.  Second look laparoscopy was performed revealing above findings.  Nondisposable irrigation system was used to irrigate the cuff and remove any remaining blood clots.  Pictures were taken.  The 2 lateral ports were then removed under direct  visualization and these ports were closed with 4-0 Monocryl.  The camera was removed and the pneumoperitoneum was relieved.  Previously placed stay sutures were used to reapproximate the fascia.  Good fascial integrity was noted.  The umbilical wound required cauterization of one subcutaneous vessel with the use of Kleppinger paddles.  Once hemostasis was secured the wound was closed with 4 Monocryl in a subcuticular interrupted fashion.  The wounds were appropriately dressed.  The patient was replaced supine position reversed from general anesthesia and taken to recovery awake and stable condition.  All sponge lap needle counts were correct x3.

## 2011-11-23 NOTE — Anesthesia Postprocedure Evaluation (Signed)
  Anesthesia Post-op Note  Patient: Susan Peck  Procedure(s) Performed: Procedure(s) (LRB): LAPAROSCOPIC ASSISTED VAGINAL HYSTERECTOMY (N/A) CYSTOSCOPY (N/A) SALPINGO OOPHERECTOMY (Bilateral)  Patient Location: PACU  Anesthesia Type: General  Level of Consciousness: awake, alert  and oriented  Airway and Oxygen Therapy: Patient Spontanous Breathing and Patient connected to nasal cannula oxygen  Post-op Pain: mild  Post-op Assessment: Post-op Vital signs reviewed, Patient's Cardiovascular Status Stable, Respiratory Function Stable, Patent Airway, No signs of Nausea or vomiting and Pain level controlled  Post-op Vital Signs: Reviewed and stable  Complications: No apparent anesthesia complications

## 2011-11-24 LAB — CBC
HCT: 31.1 % — ABNORMAL LOW (ref 36.0–46.0)
Hemoglobin: 10.7 g/dL — ABNORMAL LOW (ref 12.0–15.0)
MCV: 80.4 fL (ref 78.0–100.0)
RBC: 3.87 MIL/uL (ref 3.87–5.11)
WBC: 11.7 10*3/uL — ABNORMAL HIGH (ref 4.0–10.5)

## 2011-11-24 LAB — BASIC METABOLIC PANEL
BUN: 13 mg/dL (ref 6–23)
CO2: 27 mEq/L (ref 19–32)
Chloride: 103 mEq/L (ref 96–112)
Creatinine, Ser: 0.65 mg/dL (ref 0.50–1.10)

## 2011-11-24 LAB — GLUCOSE, CAPILLARY
Glucose-Capillary: 290 mg/dL — ABNORMAL HIGH (ref 70–99)
Glucose-Capillary: 191 mg/dL — ABNORMAL HIGH (ref 70–99)

## 2011-11-24 MED ORDER — HYDROCODONE-ACETAMINOPHEN 7.5-500 MG PO TABS: 1.0000 | ORAL_TABLET | Freq: Four times a day (QID) | ORAL | Status: AC | PRN

## 2011-11-24 MED ORDER — PROMETHAZINE HCL 25 MG PO TABS: 25.0000 mg | ORAL_TABLET | Freq: Three times a day (TID) | ORAL | Status: DC | PRN

## 2011-11-24 MED ORDER — ACETAMINOPHEN 325 MG PO TABS: 650.0000 mg | ORAL_TABLET | ORAL | Status: DC | PRN

## 2011-11-24 NOTE — Progress Notes (Signed)
1 Day Post-Op Procedure(s) (LRB): LAPAROSCOPIC ASSISTED VAGINAL HYSTERECTOMY (N/A) CYSTOSCOPY (N/A) SALPINGO OOPHERECTOMY (Bilateral)  Subjective: Patient reports no problems voiding.   Has been ambulating without difficulty Objective: I have reviewed patient's vital signs, intake and output, medications and labs.  General: alert, cooperative and no distress Resp: clear to auscultation bilaterally Cardio: regular rate and rhythm, S1, S2 normal, no murmur, click, rub or gallop GI: soft, non-tender; bowel sounds normal; no masses,  no organomegaly and normal findings: incisions are without evidence of infection and the left site has had some sertous drainage but all 3 are minimally tender Extremities: extremities normal, atraumatic, no cyanosis or edema, Homans sign is negative, no sign of DVT and no edema, redness or tenderness in the calves or thighs appears well  Assessment: s/p Procedure(s) (LRB): LAPAROSCOPIC ASSISTED VAGINAL HYSTERECTOMY (N/A) CYSTOSCOPY (N/A) SALPINGO OOPHERECTOMY (Bilateral): stable  Plan: Discharge home  LOS: 1 day    Susan Peck H. 11/24/2011, 9:42 AM

## 2011-11-24 NOTE — Discharge Summary (Signed)
Physician Discharge Summary  Patient ID: Susan Peck MRN: [id number] DOB/AGE: August 06, 1965 46 y.o.  Admit date: 11/23/2011 Discharge date: 11/24/2011  Admission Diagnoses: Bilateral hematosalpinx Pelvic pain Systolic heart failure Obesity Hypertension Obstructive sleep apnea  The new diagnosis of diabetes Discharge Diagnoses:  Active Problems:  * No active hospital problems. *   the same plus status post LAVH BSO and cystoscopy  Discharged Condition: good  Hospital Course: The patient was admitted for the bilateral hematosalpinx and pelvic pain.  Susan Peck LAVH BSO without complications estimated blood loss of 300 L.  She is tolerated the procedure well she tolerated the packing was tolerated Foley catheter well.  Those have been removed she is and does not feel like she is swollen and has no difficulty with breathing chest pain palpitations shortness of breath.  She is ready to go home she is very well aware of the need for continued ambulation.  Consults: None  Significant Diagnostic Studies: Pathology pending  Treatments: none specifically other than the LAVH BSO cystoscopy  Discharge Exam: Blood pressure 121/66, pulse 64, temperature 97.6 F (36.4 C), temperature source Oral, resp. rate 16, height 5\' 5"  (1.651 m), weight 96.616 kg (213 lb), SpO2 99.00%. Susan Dibella H. see progress note exam within normal limits and wounds healing well.  Disposition: 01-Home or Self Care  Discharge Orders    Future Appointments: Provider: Department: Dept Phone: Center:   05/15/2012 3:45 PM Barbaraann Share, MD [facility] [phone] None     Future Orders Please Complete By Expires   Diet - low sodium heart healthy      Increase activity slowly      Discharge instructions      Comments:   Call for problems   Driving Restrictions      Comments:   None for 2 weeks   Lifting restrictions      Comments:   No heavy lifting   Sexual Activity Restrictions      Comments:   For at  least two weeks - nothing in the vagina   Discharge wound care:      Comments:   As directed   Remove dressing in 24 hours      Call MD for:  temperature >100.4      Call MD for:  persistant nausea and vomiting      Call MD for:  severe uncontrolled pain      Call MD for:  redness, tenderness, or signs of infection (pain, swelling, redness, odor or green/yellow discharge around incision site)      Call MD for:  difficulty breathing, headache or visual disturbances      Call MD for:  hives      Call MD for:  persistant dizziness or light-headedness      Call MD for:  extreme fatigue      (HEART FAILURE PATIENTS) Call MD:  Anytime you have any of the following symptoms: 1) 3 pound weight gain in 24 hours or 5 pounds in 1 week 2) shortness of breath, with or without a dry hacking cough 3) swelling in the hands, feet or stomach 4) if you have to sleep on extra pillows at night in order to breathe.        Medication List  As of 11/24/2011  9:56 AM   STOP taking these medications         oxyCODONE-acetaminophen 5-325 MG per tablet         TAKE these medications  acetaminophen 325 MG tablet   Commonly known as: TYLENOL   Take 2 tablets (650 mg total) by mouth every 4 (four) hours as needed (a lortab will replace this).      aspirin 81 MG chewable tablet   Chew 81 mg by mouth daily.      carvedilol 25 MG tablet   Commonly known as: COREG   Take 25 mg by mouth 2 (two) times daily with a meal.      furosemide 40 MG tablet   Commonly known as: LASIX   Take 40 mg by mouth daily.      glyBURIDE 2.5 MG tablet   Commonly known as: DIABETA   Take 2.5 mg by mouth daily with breakfast.      HYDROcodone-acetaminophen 7.5-500 MG per tablet   Commonly known as: LORTAB   Take 1 tablet by mouth every 6 (six) hours as needed for pain.      lisinopril 20 MG tablet   Commonly known as: PRINIVIL,ZESTRIL   Take 1 tablet (20 mg total) by mouth daily.      mometasone 50 MCG/ACT nasal spray    Commonly known as: NASONEX   Place 2 sprays into the nose daily as needed.      promethazine 25 MG tablet   Commonly known as: PHENERGAN   Take 1 tablet (25 mg total) by mouth every 8 (eight) hours as needed for nausea.      spironolactone 25 MG tablet   Commonly known as: ALDACTONE   Take 25 mg by mouth daily.      VENTOLIN HFA 108 (90 BASE) MCG/ACT inhaler   Generic drug: albuterol   Inhale 2 puffs into the lungs every 6 (six) hours as needed. Takes for shortness of breath             Signed: Hafsa Lohn H. 11/24/2011, 9:56 AM

## 2011-11-24 NOTE — Anesthesia Postprocedure Evaluation (Signed)
  Anesthesia Post-op Note  Patient: Susan Peck  Procedure(s) Performed: Procedure(s) (LRB): LAPAROSCOPIC ASSISTED VAGINAL HYSTERECTOMY (N/A) CYSTOSCOPY (N/A) SALPINGO OOPHERECTOMY (Bilateral)  Patient Location: PACU and Women's Unit  Anesthesia Type: General  Level of Consciousness: awake  Airway and Oxygen Therapy: Patient Spontanous Breathing  Post-op Pain: mild  Post-op Assessment: Post-op Vital signs reviewed  Post-op Vital Signs: Reviewed and stable  Complications: No apparent anesthesia complications

## 2011-11-24 NOTE — Progress Notes (Signed)
UR Chart review completed.  

## 2011-11-24 NOTE — Addendum Note (Signed)
Addendum  created 11/24/11 0816 by Algis Greenhouse, CRNA   Modules edited:Notes Section

## 2011-11-27 ENCOUNTER — Encounter (HOSPITAL_COMMUNITY): Payer: Self-pay | Admitting: Obstetrics & Gynecology

## 2012-01-23 ENCOUNTER — Encounter: Payer: 59 | Attending: Internal Medicine

## 2012-02-23 ENCOUNTER — Telehealth (HOSPITAL_COMMUNITY): Payer: Self-pay | Admitting: Cardiology

## 2012-02-23 ENCOUNTER — Other Ambulatory Visit (HOSPITAL_COMMUNITY): Payer: Self-pay | Admitting: Adult Health

## 2012-02-23 MED ORDER — LISINOPRIL 20 MG PO TABS: 20.0000 mg | ORAL_TABLET | Freq: Every day | ORAL | Status: DC

## 2012-02-23 NOTE — Telephone Encounter (Signed)
Request refill of Lisinopril 20mg  one po daily [facility]

## 2012-02-23 NOTE — Telephone Encounter (Signed)
Spoke with pt, aware refill complete

## 2012-02-29 ENCOUNTER — Ambulatory Visit (HOSPITAL_COMMUNITY)
Admission: RE | Admit: 2012-02-29 | Discharge: 2012-02-29 | Disposition: A | Discharge status: Home / Self Care | Payer: 59 | Source: Ambulatory Visit | Attending: Adult Health | Admitting: Adult Health

## 2012-02-29 ENCOUNTER — Encounter (HOSPITAL_COMMUNITY): Payer: Self-pay

## 2012-02-29 ENCOUNTER — Ambulatory Visit (HOSPITAL_COMMUNITY)
Admission: RE | Admit: 2012-02-29 | Discharge: 2012-02-29 | Disposition: A | Discharge status: Home / Self Care | Payer: 59 | Source: Ambulatory Visit | Attending: Internal Medicine | Admitting: Internal Medicine

## 2012-02-29 VITALS — BP 120/70 | HR 69 | Resp 18 | Ht 64.0 in | Wt 206.8 lb

## 2012-02-29 DIAGNOSIS — I5022 Chronic systolic (congestive) heart failure: Secondary | ICD-10-CM

## 2012-02-29 DIAGNOSIS — I519 Heart disease, unspecified: Secondary | ICD-10-CM

## 2012-02-29 DIAGNOSIS — G4733 Obstructive sleep apnea (adult) (pediatric): Secondary | ICD-10-CM

## 2012-02-29 MED ORDER — LISINOPRIL 20 MG PO TABS: ORAL_TABLET | ORAL | Status: DC

## 2012-02-29 NOTE — Progress Notes (Signed)
Patient ID: Susan Peck, female   DOB: 1965/12/22, 46 y.o.   MRN: [id number] PCP: Dr Lovell Sheehan GYN: Dr Filbert Berthold Pulmonologist: Dr Shelle Iron  HPI:  Susan Peck is a 46 year old African American female with systolic heart failure, HTN, GERD and asthma.   2004 cardiac cath by Dr Campbell Lerner in [place], [place] with one blockage noted - no stent. EF said to be normal. About 1 year ago at [facility] noted murmur and she was referred to Dr Katrinka Blazing EF 25-30%.  Admitted earlier this year due to acute HF after not taking her meds.BNP was found to be mildly elevated in the 500s.  She was admitted and started on IV Lasix.  She diuresed well.  After she started diuresing  well, her weight dropped approximately 3 pounds.   She is able to ambulate well on room air and her BNP  on day of discharge was down to 150. 04/03/2011  ECHO 25-30% EF  10/26 Sleep study revealed moderate obstructive sleep apnea with desaturations noted.   06/26/2012 ECHO EF 30-35%  11/23/11 Hysterectomy 02/29/2012 ECHO EF 35-40%  She is here for routine follow up. Denies SOB/PND/CP/Orthpnea. Weight at home 203-206 pounds. Complaint with medications. Working full time at [employer]. Walking 15-20 minutes per day. Using CPAP most nights and feels much better.   ROS: All other systems normal except as mentioned in HPI, past medical history and problem list.    Past Medical History  Diagnosis Date  . Systolic heart failure     May 2011 EF 35-40%, 04/03/11 EF 25-30%, 06/27/11 EF 30-35%  . Hypertension   . Asthma   . Obesity   . GERD (gastroesophageal reflux disease)   . Sleep apnea   . Heart murmur     dx 2 yrs ago per pt  . Seasonal allergies   . Shortness of breath     occasional - exercise induced  . Diabetes mellitus     recent dx 11/17/11 - started med 11/18/11  . Goiter 07/27/2011    non-neoplastic goiter - fine needle aspiration - benign  . Low back pain     history  . Anxiety     no meds  . Depression     no meds  . IBS (irritable bowel  syndrome)     tx with diet per pt    Current Outpatient Prescriptions  Medication Sig Dispense Refill  . acetaminophen (TYLENOL) 325 MG tablet Take 2 tablets (650 mg total) by mouth every 4 (four) hours as needed (a lortab will replace this).  30 tablet  1  . albuterol (VENTOLIN HFA) 108 (90 BASE) MCG/ACT inhaler Inhale 2 puffs into the lungs every 6 (six) hours as needed. Takes for shortness of breath      . aspirin 81 MG chewable tablet Chew 81 mg by mouth daily.      . carvedilol (COREG) 25 MG tablet Take 25 mg by mouth 2 (two) times daily with a meal.      . furosemide (LASIX) 40 MG tablet Take 40 mg by mouth daily.       Marland Kitchen glyBURIDE (DIABETA) 2.5 MG tablet Take 2.5 mg by mouth daily with breakfast.      . lisinopril (PRINIVIL,ZESTRIL) 20 MG tablet Take 1 tablet (20 mg total) by mouth daily.  30 tablet  6  . mometasone (NASONEX) 50 MCG/ACT nasal spray Place 2 sprays into the nose daily as needed.       . promethazine (PHENERGAN) 25 MG  tablet Take 1 tablet (25 mg total) by mouth every 8 (eight) hours as needed for nausea.  10 tablet  0  . promethazine (PHENERGAN) 25 MG tablet Take 1 tablet (25 mg total) by mouth every 8 (eight) hours as needed for nausea.  10 tablet  0  . spironolactone (ALDACTONE) 25 MG tablet Take 25 mg by mouth daily.      Marland Kitchen DISCONTD: eplerenone (INSPRA) 25 MG tablet Take 1 tablet (25 mg total) by mouth daily.  30 tablet  6     No Known Allergies  History   Social History  . Marital Status: Married    Spouse Name: N/A    Number of Children: Y  . Years of Education: N/A   Occupational History  .    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 Smoking status: Never Smoker   . Smokeless tobacco: Never Used  . Alcohol Use: No  . Drug Use: No  . Sexually Active: Yes    Birth Control/ Protection: Surgical   Other Topics Concern  . Not on file   Social History Narrative   She lives with her husband and son.  She is  works for [employer].    Family History  Problem Relation Age of Onset  . Heart disease Maternal Aunt   . Breast cancer Mother   . Hypertension Maternal Grandmother   . Breast cancer Maternal Aunt     PHYSICAL EXAM: Filed Vitals:   02/29/12 1539  BP: 120/70  Pulse: 69  Resp: 18  Weight 206 (211) General:  Well appearing. No respiratory difficulty HEENT:  Normal Neck: supple. JVP 6-7 Carotids 2+ bilat; no bruits. No lymphadenopathy. + large nodular goiter Cor: PMI nondisplaced. Regular rate & rhythm. No rubs or murmurs.  Lungs: clear Abdomen: soft, nontender, obese, nondistended. No hepatosplenomegaly. No bruits or masses. Good bowel sounds. Extremities: no cyanosis, clubbing, rash., no edema R and LLE Neuro: alert & oriented x 3, cranial nerves grossly intact. moves all 4 extremities w/o difficulty. Affect pleasant.   ASSESSMENT & PLAN:

## 2012-02-29 NOTE — Assessment & Plan Note (Addendum)
NYHA II. Volume status stable. Reviewed ECHO result during OV. EF with minimal improvement now 35-40%.  Continue lisinopril 20 mg in am and add 10 mg in pm. Continue current regimen. Reinforced daily weights, low salt food choices, and limiting fluid intake to less than 2 l iters per day. Follow up in 2 months. Repeat ECHO in 4 months  Patient seen and examined with Tonye Becket, NP. We discussed all aspects of the encounter. I agree with the assessment and plan as stated above. Doing very well.  Echo reviewed personally in clinic. EF improved slightly. Now > 35% (no ICD needed). Will continue to titrate lisinopril. Reinforced need for daily weights and reviewed use of sliding scale diuretics.

## 2012-02-29 NOTE — Assessment & Plan Note (Signed)
Continue CPAP per Dr. Clance. 

## 2012-02-29 NOTE — Progress Notes (Signed)
  Echocardiogram 2D Echocardiogram has been performed.  Susan Peck 02/29/2012, 3:06 PM

## 2012-02-29 NOTE — Patient Instructions (Addendum)
Take Lisinopril 1 tab in am and 1/2 tab in pm.  Follow up in 2 months  Do the following things EVERYDAY: 1) Weigh yourself in the morning before breakfast. Write it down and keep it in a log. 2) Take your medicines as prescribed 3) Eat low salt foods-Limit salt (sodium) to 2000 mg per day.  4) Stay as active as you can everyday 5) Limit all fluids for the day to less than 2 liters

## 2012-03-26 ENCOUNTER — Other Ambulatory Visit (HOSPITAL_COMMUNITY): Payer: Self-pay | Admitting: *Deleted

## 2012-03-26 MED ORDER — CARVEDILOL 25 MG PO TABS: 25.0000 mg | ORAL_TABLET | Freq: Two times a day (BID) | ORAL | Status: DC

## 2012-05-15 ENCOUNTER — Ambulatory Visit: Payer: 59 | Admitting: Pulmonary Disease

## 2012-06-24 ENCOUNTER — Other Ambulatory Visit (HOSPITAL_COMMUNITY): Payer: Self-pay | Admitting: Adult Health

## 2012-07-29 ENCOUNTER — Other Ambulatory Visit (HOSPITAL_COMMUNITY): Payer: Self-pay | Admitting: *Deleted

## 2012-07-29 MED ORDER — LISINOPRIL 20 MG PO TABS: ORAL_TABLET | ORAL | Status: DC

## 2012-09-02 ENCOUNTER — Other Ambulatory Visit (HOSPITAL_COMMUNITY): Payer: Self-pay | Admitting: *Deleted

## 2012-09-02 MED ORDER — SPIRONOLACTONE 25 MG PO TABS: 25.0000 mg | ORAL_TABLET | Freq: Every day | ORAL | Status: DC

## 2012-10-25 ENCOUNTER — Other Ambulatory Visit (HOSPITAL_COMMUNITY): Payer: Self-pay | Admitting: Internal Medicine

## 2012-10-25 DIAGNOSIS — Z1231 Encounter for screening mammogram for malignant neoplasm of breast: Secondary | ICD-10-CM

## 2012-10-31 ENCOUNTER — Ambulatory Visit (HOSPITAL_COMMUNITY)
Admission: RE | Admit: 2012-10-31 | Discharge: 2012-10-31 | Disposition: A | Discharge status: Home / Self Care | Payer: 59 | Source: Ambulatory Visit | Attending: Internal Medicine | Admitting: Internal Medicine

## 2012-10-31 DIAGNOSIS — Z1231 Encounter for screening mammogram for malignant neoplasm of breast: Secondary | ICD-10-CM

## 2012-11-02 ENCOUNTER — Other Ambulatory Visit (HOSPITAL_COMMUNITY): Payer: Self-pay | Admitting: Internal Medicine

## 2012-11-19 ENCOUNTER — Ambulatory Visit (HOSPITAL_BASED_OUTPATIENT_CLINIC_OR_DEPARTMENT_OTHER)
Admission: RE | Admit: 2012-11-19 | Discharge: 2012-11-19 | Disposition: A | Discharge status: Home / Self Care | Payer: 59 | Source: Ambulatory Visit | Attending: Internal Medicine | Admitting: Internal Medicine

## 2012-11-19 ENCOUNTER — Encounter (HOSPITAL_COMMUNITY): Payer: Self-pay

## 2012-11-19 ENCOUNTER — Ambulatory Visit (HOSPITAL_COMMUNITY)
Admission: RE | Admit: 2012-11-19 | Discharge: 2012-11-19 | Disposition: A | Discharge status: Home / Self Care | Payer: 59 | Source: Ambulatory Visit | Attending: Internal Medicine | Admitting: Internal Medicine

## 2012-11-19 ENCOUNTER — Other Ambulatory Visit (HOSPITAL_COMMUNITY): Payer: Self-pay | Admitting: Internal Medicine

## 2012-11-19 VITALS — BP 122/86 | Wt 208.1 lb

## 2012-11-19 DIAGNOSIS — I369 Nonrheumatic tricuspid valve disorder, unspecified: Secondary | ICD-10-CM

## 2012-11-19 DIAGNOSIS — I509 Heart failure, unspecified: Secondary | ICD-10-CM

## 2012-11-19 DIAGNOSIS — F3289 Other specified depressive episodes: Secondary | ICD-10-CM | POA: Insufficient documentation

## 2012-11-19 DIAGNOSIS — K589 Irritable bowel syndrome without diarrhea: Secondary | ICD-10-CM | POA: Insufficient documentation

## 2012-11-19 DIAGNOSIS — F329 Major depressive disorder, single episode, unspecified: Secondary | ICD-10-CM | POA: Insufficient documentation

## 2012-11-19 DIAGNOSIS — Z79899 Other long term (current) drug therapy: Secondary | ICD-10-CM | POA: Insufficient documentation

## 2012-11-19 DIAGNOSIS — Z7982 Long term (current) use of aspirin: Secondary | ICD-10-CM | POA: Insufficient documentation

## 2012-11-19 DIAGNOSIS — I10 Essential (primary) hypertension: Secondary | ICD-10-CM | POA: Insufficient documentation

## 2012-11-19 DIAGNOSIS — I5020 Unspecified systolic (congestive) heart failure: Secondary | ICD-10-CM | POA: Insufficient documentation

## 2012-11-19 DIAGNOSIS — I5022 Chronic systolic (congestive) heart failure: Secondary | ICD-10-CM

## 2012-11-19 DIAGNOSIS — G4733 Obstructive sleep apnea (adult) (pediatric): Secondary | ICD-10-CM | POA: Insufficient documentation

## 2012-11-19 DIAGNOSIS — K219 Gastro-esophageal reflux disease without esophagitis: Secondary | ICD-10-CM | POA: Insufficient documentation

## 2012-11-19 DIAGNOSIS — J45909 Unspecified asthma, uncomplicated: Secondary | ICD-10-CM | POA: Insufficient documentation

## 2012-11-19 NOTE — Assessment & Plan Note (Addendum)
NYHA I. Volume status stable. Continue current regimen. Dr Gala Romney reviewed and discussed ECHO. EF improved 50-55% and inferior wall is hypokinetic. Offered cardiac cath versus cardiac MRI or just to continue current regimen. She requested cardiac MRI.  Will schedule. Follow up in 6 months.   Patient seen and examined with Tonye Becket, NP. We discussed all aspects of the encounter. I agree with the assessment and plan as stated above.  Echo reviewed personally. EF now low normal range with apparent mild inferior/septal HK. Discussed possibility of progressive CAD (last cath 2004). Discuss watchful waiting vs MRI or cath to further evaluate. She is only mildly symptomatic currently. She wants to proceed with MRI now to look for scar. We will arrange. Continue current therapy.

## 2012-11-19 NOTE — Assessment & Plan Note (Addendum)
Encouraged to start using CPAP nightly.   Attending: Agree.

## 2012-11-19 NOTE — Progress Notes (Signed)
  Echocardiogram 2D Echocardiogram has been performed.  Susan Peck, Jillana 11/19/2012, 11:39 AM

## 2012-11-19 NOTE — Progress Notes (Signed)
Patient ID: Susan Peck, female   DOB: [birth year], 47 y.o.   MRN: [id number] PCP: Dr Lovell Sheehan GYN: Dr Filbert Berthold Pulmonologist: Dr Shelle Iron  HPI:  Susan Peck is a 47 year old African American female with systolic heart failure, HTN, GERD, 11/23/11 S/P Hysterectomy, and asthma.   2004 cardiac cath by Dr Campbell Lerner in [place], [place] with one blockage noted - no stent. EF said to be normal. About 1 year ago at [facility] noted murmur and she was referred to Dr Katrinka Blazing EF 25-30%.  Myoview 02/03/10 No ischemia Septal and apical hypokinesis  Admitted in 2012 with acute HF after not taking her meds.She was restarted on HF medications.        05/19/2011  Sleep study revealed moderate obstructive sleep apnea with desaturations noted.   04/03/2011  ECHO EF 25-30% 06/26/2012 ECHO EF 30-35%  02/29/2012 ECHO EF 35-40%  11/18/12 ECHO EF 50-55% Inferior wall hypokinetic  She is here for routine follow up. Denies SOB/PND/CP/Orthpnea. Weight at home 205-208 pounds. Complaint with medications. Working full time at [employer]. Not exercising. Not using CPAP most nights and feels much better.   ROS: All other systems normal except as mentioned in HPI, past medical history and problem list.    Past Medical History  Diagnosis Date  . Systolic heart failure     May 2011 EF 35-40%, 04/03/11 EF 25-30%, 06/27/11 EF 30-35%  . Hypertension   . Asthma   . Obesity   . GERD (gastroesophageal reflux disease)   . Sleep apnea   . Heart murmur     dx 2 yrs ago per pt  . Seasonal allergies   . Shortness of breath     occasional - exercise induced  . Diabetes mellitus     recent dx 11/17/11 - started med 11/18/11  . Goiter 07/27/2011    non-neoplastic goiter - fine needle aspiration - benign  . Low back pain     history  . Anxiety     no meds  . Depression     no meds  . IBS (irritable bowel syndrome)     tx with diet per pt    Current Outpatient Prescriptions  Medication Sig Dispense Refill  . albuterol (VENTOLIN HFA) 108  (90 BASE) MCG/ACT inhaler Inhale 2 puffs into the lungs every 6 (six) hours as needed. Takes for shortness of breath      . aspirin 81 MG chewable tablet Chew 81 mg by mouth daily.      . carvedilol (COREG) 25 MG tablet TAKE ONE TABLET BY MOUTH TWICE DAILY WITH MEALS  60 tablet  6  . furosemide (LASIX) 40 MG tablet Take 40 mg by mouth daily.       [employer]land [employer] glyBURIDE (DIABETA) 2.5 MG tablet Take 2.5 mg by mouth daily with breakfast.      . lisinopril (PRINIVIL,ZESTRIL) 20 MG tablet Take 1 tab in am and 1/2 tab in pm  60 tablet  6  . spironolactone (ALDACTONE) 25 MG tablet Take 1 tablet (25 mg total) by mouth daily.  30 tablet  6  . [DISCONTINUED] eplerenone (INSPRA) 25 MG tablet Take 1 tablet (25 mg total) by mouth daily.  30 tablet  6   No current facility-administered medications for this encounter.     No Known Allergies  History   Social History  . Marital Status: Married    Spouse Name: Susan Peck    Number of Children: Y  . Years of Education: Susan Peck   Occupational  History  .    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 Smoking status: Never Smoker   . Smokeless tobacco: Never Used  . Alcohol Use: No  . Drug Use: No  . Sexually Active: Yes    Birth Control/ Protection: Surgical   Other Topics Concern  . Not on file   Social History Narrative   She lives with her husband and son.  She is works for [employer].    Family History  Problem Relation Age of Onset  . Heart disease Maternal Aunt   . Breast cancer Mother   . Hypertension Maternal Grandmother   . Breast cancer Maternal Aunt     PHYSICAL EXAM: Filed Vitals:   11/19/12 1202  BP: 122/86  General:  Well appearing. No respiratory difficulty HEENT:  Normal Neck: supple. JVP 5-6 Carotids 2+ bilat; no bruits. No lymphadenopathy. + large nodular goiter Cor: PMI nondisplaced. Regular rate & rhythm. No rubs or murmurs.  Lungs: clear Abdomen: soft, nontender, obese, nondistended. No  hepatosplenomegaly. No bruits or masses. Good bowel sounds. Extremities: no cyanosis, clubbing, rash., no edema R and LLE Neuro: alert & oriented x 3, cranial nerves grossly intact. moves all 4 extremities w/o difficulty. Affect pleasant.   ASSESSMENT & PLAN:

## 2012-11-19 NOTE — Patient Instructions (Addendum)
Follow up in 6 months  We will schedule Cardiac MRI  Do the following things EVERYDAY: 1) Weigh yourself in the morning before breakfast. Write it down and keep it in a log. 2) Take your medicines as prescribed 3) Eat low salt foods-Limit salt (sodium) to 2000 mg per day.  4) Stay as active as you can everyday 5) Limit all fluids for the day to less than 2 liters

## 2012-12-13 ENCOUNTER — Other Ambulatory Visit (HOSPITAL_COMMUNITY): Payer: Self-pay | Admitting: Anesthesiology

## 2012-12-13 MED ORDER — FUROSEMIDE 40 MG PO TABS: 40.0000 mg | ORAL_TABLET | Freq: Every day | ORAL | Status: DC

## 2012-12-13 NOTE — Telephone Encounter (Signed)
Patient called out of lasix x 2 weeks and weight up 4 lbs. Sent refill for patient to [facility].

## 2013-01-01 ENCOUNTER — Telehealth (HOSPITAL_COMMUNITY): Payer: Self-pay | Admitting: *Deleted

## 2013-01-01 NOTE — Telephone Encounter (Signed)
Pt called concerned about her left arm, she states when she woke up on Mon her left hand was swollen, yesterday it spread up her arm almost to her elbow, today she states it is very swollen and painful, it appears red to her.  She denies any insect bites or injury.  Her weight is stable at 207, no edema anywhere else.  Advised pt needs to get checkout either by pcp or urgent care, she is agreeable

## 2013-03-13 ENCOUNTER — Ambulatory Visit: Payer: Self-pay | Admitting: Gynecology

## 2013-04-15 ENCOUNTER — Encounter: Payer: Self-pay | Admitting: Gynecology

## 2013-04-15 ENCOUNTER — Ambulatory Visit (INDEPENDENT_AMBULATORY_CARE_PROVIDER_SITE_OTHER): Payer: 59 | Admitting: Gynecology

## 2013-04-15 VITALS — BP 124/78 | Ht 63.0 in | Wt 204.6 lb

## 2013-04-15 DIAGNOSIS — R232 Flushing: Secondary | ICD-10-CM

## 2013-04-15 DIAGNOSIS — N951 Menopausal and female climacteric states: Secondary | ICD-10-CM

## 2013-04-15 DIAGNOSIS — Z01419 Encounter for gynecological examination (general) (routine) without abnormal findings: Secondary | ICD-10-CM

## 2013-04-15 NOTE — Progress Notes (Signed)
Susan Peck Nov 24, 1965 [id number]   History:    47 y.o.  for annual gyn exam who is a new patient to the practice. Patient's been complaining of hot flashes and some times insomnia. Patient stated she had a full gynecological examination with normal Pap smear in 2013. Patient denies any prior history of abnormal Pap smears. Her last mammogram was in April 2013. Patient is being followed by the cardiologist secondary to chronic systolic heart failure see Medical history and medication list in Epic for details. The patient states her mother had breast cancer at the age of 48. Patient with prior history of laparoscopic-assisted vaginal hysterectomy and she thinks that it was the left tube and ovary there were removed at the same time for benign pathology. The patient's primary physician has been doing her blood work. She has been followed by her PCP for type 2 diabetes, asthma, and hypertension.  Patient has been complaining of upper mid back discomfort as a result of her pendulous breasts.  Past medical history,surgical history, family history and social history were all reviewed and documented in the EPIC chart.  Gynecologic History No LMP recorded. Patient has had an ablation. Contraception: status post hysterectomy Last Pap: 2013. Results were: normal Last mammogram: 2013. Results were: normal  Obstetric History OB History  Gravida Para Term Preterm AB SAB TAB Ectopic Multiple Living  7 3   3 1    3     # Outcome Date GA Lbr Len/2nd Weight Sex Delivery Anes PTL Lv  7 SAB           6 ABT           5 ABT           4 PAR           3 PAR           2 PAR           1 GRA                ROS: A ROS was performed and pertinent positives and negatives are included in the history.  GENERAL: No fevers or chills. HEENT: No change in vision, no earache, sore throat or sinus congestion. NECK: No pain or stiffness. CARDIOVASCULAR: No chest pain or pressure. No palpitations. PULMONARY: No  shortness of breath, cough or wheeze. GASTROINTESTINAL: No abdominal pain, nausea, vomiting or diarrhea, melena or bright red blood per rectum. GENITOURINARY: No urinary frequency, urgency, hesitancy or dysuria. MUSCULOSKELETAL:upper back and neck discomfort after standing for long periods of time as a result of her large breasts DERMATOLOGIC: No rash, no itching, no lesions. ENDOCRINE: No polyuria, polydipsia, no heat or cold intolerance. No recent change in weight. HEMATOLOGICAL: No anemia or easy bruising or bleeding. NEUROLOGIC: No headache, seizures, numbness, tingling or weakness. PSYCHIATRIC: No depression, no loss of interest in normal activity or change in sleep pattern.     Exam: chaperone present  BP 124/78  Ht 5\' 3"  (1.6 m)  Wt 204 lb 9.6 oz (92.806 kg)  BMI 36.25 kg/m2  Body mass index is 36.25 kg/(m^2).  General appearance : Well developed well nourished female. No acute distress HEENT: Neck supple, trachea midline, no carotid bruits, no thyroidmegaly Lungs: Clear to auscultation, no rhonchi or wheezes, or rib retractions  Heart: Regular rate and rhythm, no murmurs or gallops Breast:Examined in sitting and supine position were symmetrical in appearance, no palpable masses or tenderness,  no skin retraction, no nipple inversion, no  nipple discharge, no skin discoloration, no axillary or supraclavicular lymphadenopathy, pendulous breasts Abdomen: no palpable masses or tenderness, no rebound or guarding Extremities: no edema or skin discoloration or tenderness  Pelvic:  Bartholin, Urethra, Skene Glands: Within normal limits             Vagina: No gross lesions or discharge  Cervix:absent  Uterus  Absent  Adnexa  Without masses or tenderness  Anus and perineum  normal   Rectovaginal  normal sphincter tone without palpated masses or tenderness             Hemoccult Medicated   and  Assessment/Plan:  47 y.o. female for annual exam with perimenopausal like symptoms we will  check her FSH as well as her TSH. Literature information on the perimenopause and menopause was provided. We discussed the importance of monthly breast exam. Patient should follow up with her mammograms. The patient will be referred to the plastic surgeon for consideration of reduction mammoplasty as a result of her symptomatic pendulous breasts contributing to her upper back and neck spasms. Pap smear not done today the new guidelines were discussed. We discussed the importance of calcium and vitamin D for osteoporosis prevention.    Ok Edwards MD, 5:02 PM 04/15/2013

## 2013-04-15 NOTE — Patient Instructions (Signed)
Hormone Therapy At menopause, your body begins making less estrogen and progesterone hormones. This causes the body to stop having menstrual periods. This is because estrogen and progesterone hormones control your periods and menstrual cycle. A lack of estrogen may cause symptoms such as:  Hot flushes (or hot flashes).  Vaginal dryness.  Dry skin.  Loss of sex drive.  Risk of bone loss (osteoporosis). When this happens, you may choose to take hormone therapy to get back the estrogen lost during menopause. When the hormone estrogen is given alone, it is usually referred to as ET (Estrogen Therapy). When the hormone progestin is combined with estrogen, it is generally called HT (Hormone Therapy). This was formerly known as hormone replacement therapy (HRT). Your caregiver can help you make a decision on what will be best for you. The decision to use HT seems to change often as new studies are done. Many studies do not agree on the benefits of hormone replacement therapy. LIKELY BENEFITS OF HT INCLUDE PROTECTION FROM:  Hot Flushes (also called hot flashes) - A hot flush is a sudden feeling of heat that spreads over the face and body. The skin may redden like a blush. It is connected with sweats and sleep disturbance. Women going through menopause may have hot flushes a few times a month or several times per day depending on the woman.  Osteoporosis (bone loss)- Estrogen helps guard against bone loss. After menopause, a woman's bones slowly lose calcium and become weak and brittle. As a result, bones are more likely to break. The hip, wrist, and spine are affected most often. Hormone therapy can help slow bone loss after menopause. Weight bearing exercise and taking calcium with vitamin D also can help prevent bone loss. There are also medications that your caregiver can prescribe that can help prevent osteoporosis.  Vaginal Dryness - Loss of estrogen causes changes in the vagina. Its lining may  become thin and dry. These changes can cause pain and bleeding during sexual intercourse. Dryness can also lead to infections. This can cause burning and itching. (Vaginal estrogen treatment can help relieve pain, itching, and dryness.)  Urinary Tract Infections are more common after menopause because of lack of estrogen. Some women also develop urinary incontinence because of low estrogen levels in the vagina and bladder.  Possible other benefits of estrogen include a positive effect on mood and short-term memory in women. RISKS AND COMPLICATIONS  Using estrogen alone without progesterone causes the lining of the uterus to grow. This increases the risk of lining of the uterus (endometrial) cancer. Your caregiver should give another hormone called progestin if you have a uterus.  Women who take combined (estrogen and progestin) HT appear to have an increased risk of breast cancer. The risk appears to be small, but increases throughout the time that HT is taken.  Combined therapy also makes the breast tissue slightly denser which makes it harder to read mammograms (breast X-rays).  Combined, estrogen and progesterone therapy can be taken together every day, in which case there may be spotting of blood. HT therapy can be taken cyclically in which case you will have menstrual periods. Cyclically means HT is taken for a set amount of days, then not taken, then this process is repeated.  HT may increase the risk of stroke, heart attack, breast cancer and forming blood clots in your leg.  Transdermal estrogen (estrogen that is absorbed through the skin with a patch or a cream) may have more positive results with:    Cholesterol.  Blood pressure.  Blood clots. Having the following conditions may indicate you should not have HT:  Endometrial cancer.  Liver disease.  Breast cancer.  Heart disease.  History of blood clots.  Stroke. TREATMENT   If you choose to take HT and have a uterus,  usually estrogen and progestin are prescribed.  Your caregiver will help you decide the best way to take the medications.  Possible ways to take estrogen include:  Pills.  Patches.  Gels.  Sprays.  Vaginal estrogen cream, rings and tablets.  It is best to take the lowest dose possible that will help your symptoms and take them for the shortest period of time that you can.  Hormone therapy can help relieve some of the problems (symptoms) that affect women at menopause. Before making a decision about HT, talk to your caregiver about what is best for you. Be well informed and comfortable with your decisions. HOME CARE INSTRUCTIONS   Follow your caregivers advice when taking the medications.  A Pap test is done to screen for cervical cancer.  The first Pap test should be done at age 74.  Between ages 39 and 31, Pap tests are repeated every 2 years.  Beginning at age 39, you are advised to have a Pap test every 3 years as long as your past 3 Pap tests have been normal.  Some women have medical problems that increase the chance of getting cervical cancer. Talk to your caregiver about these problems. It is especially important to talk to your caregiver if a new problem develops soon after your last Pap test. In these cases, your caregiver may recommend more frequent screening and Pap tests.  The above recommendations are the same for women who have or have not gotten the vaccine for HPV (Human Papillomavirus).  If you had a hysterectomy for a problem that was not a cancer or a condition that could lead to cancer, then you no longer need Pap tests. However, even if you no longer need a Pap test, a regular exam is a good idea to make sure no other problems are starting.   If you are between ages 13 and 31, and you have had normal Pap tests going back 10 years, you no longer need Pap tests. However, even if you no longer need a Pap test, a regular exam is a good idea to make sure no  other problems are starting.   If you have had past treatment for cervical cancer or a condition that could lead to cancer, you need Pap tests and screening for cancer for at least 20 years after your treatment.  If Pap tests have been discontinued, risk factors (such as a new sexual partner) need to be re-assessed to determine if screening should be resumed.  Some women may need screenings more often if they are at high risk for cervical cancer.  Get mammograms done as per the advice of your caregiver. SEEK IMMEDIATE MEDICAL CARE IF:  You develop abnormal vaginal bleeding.  You have pain or swelling in your legs, shortness of breath, or chest pain.  You develop dizziness or headaches.  You have lumps or changes in your breasts or armpits.  You have slurred speech.  You develop weakness or numbness of your arms or legs.  You have pain, burning, or bleeding when urinating.  You develop abdominal pain. Document Released: 04/08/2003 Document Revised: 10/02/2011 Document Reviewed: 07/27/2010 [facility] Patient Information 2014 [place], [place]. Perimenopause Perimenopause is the time when  your body begins to move into the menopause (no menstrual period for 12 straight months). It is a natural process. Perimenopause can begin 2 to 8 years before the menopause and usually lasts for one year after the menopause. During this time, your ovaries may or may not produce an egg. The ovaries vary in their production of estrogen and progesterone hormones each month. This can cause irregular menstrual periods, difficulty in getting pregnant, vaginal bleeding between periods and uncomfortable symptoms. CAUSES  Irregular production of the ovarian hormones, estrogen and progesterone, and not ovulating every month.  Other causes include:  Tumor of the pituitary gland in the brain.  Medical disease that affects the ovaries.  Radiation treatment.  Chemotherapy.  Unknown causes.  Heavy  smoking and excessive alcohol intake can bring on perimenopause sooner. SYMPTOMS   Hot flashes.  Night sweats.  Irregular menstrual periods.  Decrease sex drive.  Vaginal dryness.  Headaches.  Mood swings.  Depression.  Memory problems.  Irritability.  Tiredness.  Weight gain.  Trouble getting pregnant.  The beginning of losing bone cells (osteoporosis).  The beginning of hardening of the arteries (atherosclerosis). DIAGNOSIS  Your caregiver will make a diagnosis by analyzing your age, menstrual history and your symptoms. They will do a physical exam noting any changes in your body, especially your female organs. Female hormone tests may or may not be helpful depending on the amount and when you produce the female hormones. However, other hormone tests may be helpful (ex. thyroid hormone) to rule out other problems. TREATMENT  The decision to treat during the perimenopause should be made by you and your caregiver depending on how the symptoms are affecting you and your life style. There are various treatments available such as:  Treating individual symptoms with a specific medication for that symptom (ex. tranquilizer for depression).  Herbal medications that can help specific symptoms.  Counseling.  Group therapy.  No treatment. HOME CARE INSTRUCTIONS   Before seeing your caregiver, make a list of your menstrual periods (when the occur, how heavy they are, how long between periods and how long they last), your symptoms and when they started.  Take the medication as recommended by your caregiver.  Sleep and rest.  Exercise.  Eat a diet that contains calcium (good for your bones) and soy (acts like estrogen hormone).  Do not smoke.  Avoid alcoholic beverages.  Taking vitamin E may help in certain cases.  Take calcium and vitamin D supplements to help prevent bone loss.  Group therapy is sometimes helpful.  Acupuncture may help in some cases. SEEK  MEDICAL CARE IF:   You have any of the above and want to know if it is perimenopause.  You want advice and treatment for any of your symptoms mentioned above.  You need a referral to a specialist (gynecologist, psychiatrist or psychologist). SEEK IMMEDIATE MEDICAL CARE IF:   You have vaginal bleeding.  Your period lasts longer than 8 days.  You periods are recurring sooner than 21 days.  You have bleeding after intercourse.  You have severe depression.  You have pain when you urinate.  You have severe headaches.  You develop vision problems. Document Released: 08/17/2004 Document Revised: 10/02/2011 Document Reviewed: 05/07/2008 [facility] Patient Information 2014 [place], [place]. Tetanus, Diphtheria, Pertussis (Tdap) Vaccine What You Need to Know WHY GET VACCINATED? Tetanus, diphtheria and pertussis can be very serious diseases, even for adolescents and adults. Tdap vaccine can protect [place] from these diseases. TETANUS (Lockjaw) causes painful muscle tightening  and stiffness, usually all over the body.  It can lead to tightening of muscles in the head and neck so you can't open your mouth, swallow, or sometimes even breathe. Tetanus kills about 1 out of 5 people who are infected. DIPHTHERIA can cause a thick coating to form in the back of the throat.  It can lead to breathing problems, paralysis, heart failure, and death. PERTUSSIS (Whooping Cough) causes severe coughing spells, which can cause difficulty breathing, vomiting and disturbed sleep.  It can also lead to weight loss, incontinence, and rib fractures. Up to 2 in 100 adolescents and 5 in 100 adults with pertussis are hospitalized or have complications, which could include pneumonia and death. These diseases are caused by bacteria. Diphtheria and pertussis are spread from person to person through coughing or sneezing. Tetanus enters the body through cuts, scratches, or wounds. Before vaccines, the [place] saw as  many as 200,000 cases a year of diphtheria and pertussis, and hundreds of cases of tetanus. Since vaccination began, tetanus and diphtheria have dropped by about 99% and pertussis by about 80%. TDAP VACCINE Tdap vaccine can protect adolescents and adults from tetanus, diphtheria, and pertussis. One dose of Tdap is routinely given at age 33 or 79. People who did not get Tdap at that age should get it as soon as possible. Tdap is especially important for health care professionals and anyone having close contact with a baby younger than 12 months. Pregnant women should get a dose of Tdap during every pregnancy, to protect the newborn from pertussis. Infants are most at risk for severe, life-threatening complications from pertussis. A similar vaccine, called Td, protects from tetanus and diphtheria, but not pertussis. A Td booster should be given every 10 years. Tdap may be given as one of these boosters if you have not already gotten a dose. Tdap may also be given after a severe cut or burn to prevent tetanus infection. Your doctor can give you more information. Tdap may safely be given at the same time as other vaccines. SOME PEOPLE SHOULD NOT GET THIS VACCINE  If you ever had a life-threatening allergic reaction after a dose of any tetanus, diphtheria, or pertussis containing vaccine, OR if you have a severe allergy to any part of this vaccine, you should not get Tdap. Tell your doctor if you have any severe allergies.  If you had a coma, or long or multiple seizures within 7 days after a childhood dose of DTP or DTaP, you should not get Tdap, unless a cause other than the vaccine was found. You can still get Td.  Talk to your doctor if you:  have epilepsy or another nervous system problem,  had severe pain or swelling after any vaccine containing diphtheria, tetanus or pertussis,  ever had Guillain-Barr Syndrome (GBS),  aren't feeling well on the day the shot is scheduled. RISKS OF A VACCINE  REACTION With any medicine, including vaccines, there is a chance of side effects. These are usually mild and go away on their own, but serious reactions are also possible. Brief fainting spells can follow a vaccination, leading to injuries from falling. Sitting or lying down for about 15 minutes can help prevent these. Tell your doctor if you feel dizzy or light-headed, or have vision changes or ringing in the ears. Mild problems following Tdap (Did not interfere with activities)  Pain where the shot was given (about 3 in 4 adolescents or 2 in 3 adults)  Redness or swelling where the  shot was given (about 1 person in 5)  Mild fever of at least 100.29F (up to about 1 in 25 adolescents or 1 in 100 adults)  Headache (about 3 or 4 people in 10)  Tiredness (about 1 person in 3 or 4)  Nausea, vomiting, diarrhea, stomach ache (up to 1 in 4 adolescents or 1 in 10 adults)  Chills, body aches, sore joints, rash, swollen glands (uncommon) Moderate problems following Tdap (Interfered with activities, but did not require medical attention)  Pain where the shot was given (about 1 in 5 adolescents or 1 in 100 adults)  Redness or swelling where the shot was given (up to about 1 in 16 adolescents or 1 in 25 adults)  Fever over 102F (about 1 in 100 adolescents or 1 in 250 adults)  Headache (about 3 in 20 adolescents or 1 in 10 adults)  Nausea, vomiting, diarrhea, stomach ache (up to 1 or 3 people in 100)  Swelling of the entire arm where the shot was given (up to about 3 in 100). Severe problems following Tdap (Unable to perform usual activities, required medical attention)  Swelling, severe pain, bleeding and redness in the arm where the shot was given (rare). A severe allergic reaction could occur after any vaccine (estimated less than 1 in a million doses). WHAT IF THERE IS A SERIOUS REACTION? What should I look for?  Look for anything that concerns you, such as signs of a severe allergic  reaction, very high fever, or behavior changes. Signs of a severe allergic reaction can include hives, swelling of the face and throat, difficulty breathing, a fast heartbeat, dizziness, and weakness. These would start a few minutes to a few hours after the vaccination. What should I do?  If you think it is a severe allergic reaction or other emergency that can't wait, call 9-1-1 or get the person to the nearest hospital. Otherwise, call your doctor.  Afterward, the reaction should be reported to the "Vaccine Adverse Event Reporting System" (VAERS). Your doctor might file this report, or you can do it yourself through the VAERS web site at www.vaers.LAgents.no, or by calling 1-726-609-5212. VAERS is only for reporting reactions. They do not give medical advice.  THE NATIONAL VACCINE INJURY COMPENSATION PROGRAM The National Vaccine Injury Compensation Program (VICP) is a federal program that was created to compensate people who may have been injured by certain vaccines. Persons who believe they may have been injured by a vaccine can learn about the program and about filing a claim by calling 1-3162382564 or visiting the VICP website at SpiritualWord.at. HOW CAN I LEARN MORE?  Ask your doctor.  Call your local or state health department.  Contact the Centers for Disease Control and Prevention (CDC):  Call (715)632-5717 or visit CDC's website at PicCapture.uy. CDC Tdap Vaccine VIS (11/30/11) Document Released: 01/09/2012 Document Revised: 04/03/2012 Document Reviewed: 01/09/2012 [employer] Patient Information 2014 [place], [place].

## 2013-04-16 ENCOUNTER — Other Ambulatory Visit (HOSPITAL_COMMUNITY): Payer: Self-pay | Admitting: *Deleted

## 2013-04-16 LAB — TSH: TSH: 1.752 u[IU]/mL (ref 0.350–4.500)

## 2013-04-16 LAB — FOLLICLE STIMULATING HORMONE: FSH: 30.6 m[IU]/mL

## 2013-04-16 MED ORDER — SPIRONOLACTONE 25 MG PO TABS: 25.0000 mg | ORAL_TABLET | Freq: Every day | ORAL | Status: DC

## 2013-05-01 ENCOUNTER — Ambulatory Visit: Payer: 59 | Admitting: Gynecology

## 2013-05-07 ENCOUNTER — Ambulatory Visit (HOSPITAL_COMMUNITY)
Admission: RE | Admit: 2013-05-07 | Discharge: 2013-05-07 | Disposition: A | Discharge status: Home / Self Care | Payer: 59 | Source: Ambulatory Visit | Attending: Internal Medicine | Admitting: Internal Medicine

## 2013-05-07 VITALS — BP 112/70 | HR 65 | Wt 204.5 lb

## 2013-05-07 DIAGNOSIS — I5022 Chronic systolic (congestive) heart failure: Secondary | ICD-10-CM | POA: Insufficient documentation

## 2013-05-07 NOTE — Progress Notes (Signed)
Patient ID: Susan Peck, female   DOB: 14-Nov-1965, 47 y.o.   MRN: [id number]  PCP: Dr Lovell Sheehan GYN: Dr Filbert Berthold Pulmonologist: Dr Shelle Iron  HPI:   Susan Peck is a 47 year old African American female with systolic heart failure, HTN, GERD, 11/23/11 S/P Hysterectomy, multinodular goiter and asthma.   2004 cardiac cath by Dr Campbell Lerner in [place], [place] with one blockage noted - no stent. EF said to be normal. About 1 year ago at [facility] noted murmur and she was referred to Dr Katrinka Blazing EF 25-30%.  Myoview 02/03/10 EF 42% No ischemia Septal and apical hypokinesis  Admitted in 2012 with acute HF after not taking her meds.She was restarted on HF medications.        05/19/2011  Sleep study revealed moderate obstructive sleep apnea with desaturations noted.   04/03/2011  ECHO EF 25-30% 06/26/2012 ECHO EF 30-35%  02/29/2012 ECHO EF 35-40%  11/18/12 ECHO EF 55% Inferior wall mildly hypokinetic with septal HK  At last visit in 4/14 Discussed possibility of progressive CAD (last cath 2004). Discuss watchful waiting vs MRI or cath to further evaluate. She wanted to proceed with MRI to look for scar but it was not done.  She is here for routine follow up. Only complaint is severe back pain. Says her GYN has been recommending breast reduction. Denies SOB/PND/CP/Orthpnea. Weight at home 200-205 pounds. Complaint with medications. Working full time at [employer]. Not using CPAP much.   ROS: All other systems normal except as mentioned in HPI, past medical history and problem list.    Past Medical History  Diagnosis Date  . Systolic heart failure     May 2011 EF 35-40%, 04/03/11 EF 25-30%, 06/27/11 EF 30-35%  . Hypertension   . Asthma   . Obesity   . GERD (gastroesophageal reflux disease)   . Sleep apnea   . Heart murmur     dx 2 yrs ago per pt  . Seasonal allergies   . Shortness of breath     occasional - exercise induced  . Diabetes mellitus     recent dx 11/17/11 - started med 11/18/11  . Goiter 07/27/2011     non-neoplastic goiter - fine needle aspiration - benign  . Low back pain     history  . Anxiety     no meds  . Depression     no meds  . IBS (irritable bowel syndrome)     tx with diet per pt    Current Outpatient Prescriptions  Medication Sig Dispense Refill  . albuterol (VENTOLIN HFA) 108 (90 BASE) MCG/ACT inhaler Inhale 2 puffs into the lungs every 6 (six) hours as needed. Takes for shortness of breath      . aspirin 81 MG chewable tablet Chew 81 mg by mouth daily.      . carvedilol (COREG) 25 MG tablet TAKE ONE TABLET BY MOUTH TWICE DAILY WITH MEALS  60 tablet  6  . furosemide (LASIX) 40 MG tablet Take 1 tablet (40 mg total) by mouth daily.  30 tablet  6  . glyBURIDE (DIABETA) 2.5 MG tablet Take 2.5 mg by mouth daily with breakfast.      . lisinopril (PRINIVIL,ZESTRIL) 20 MG tablet Take 1 tab in am and 1/2 tab in pm  60 tablet  6  . spironolactone (ALDACTONE) 25 MG tablet Take 1 tablet (25 mg total) by mouth daily.  30 tablet  6  . [DISCONTINUED] eplerenone (INSPRA) 25 MG tablet Take 1 tablet (25  mg total) by mouth daily.  30 tablet  6   No current facility-administered medications for this encounter.     No Known Allergies  History   Social History  . Marital Status: Married    Spouse Name: N/A    Number of Children: Y  . Years of Education: N/A   Occupational History  .    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 Smoking status: Never Smoker   . Smokeless tobacco: Never Used  . Alcohol Use: No  . Drug Use: No  . Sexual Activity: Yes    Birth Control/ Protection: Surgical   Other Topics Concern  . Not on file   Social History Narrative   She lives with her husband and son.  She is works for [employer].    Family History  Problem Relation Age of Onset  . Heart disease Maternal Aunt   . Breast cancer Mother   . Hypertension Maternal Grandmother   . Breast cancer Maternal Aunt     PHYSICAL EXAM: Filed Vitals:    05/07/13 1504  BP: 112/70  Pulse: 65  General:  Well appearing. No respiratory difficulty HEENT:  Normal Neck: supple. JVP flat Carotids 2+ bilat; no bruits. No lymphadenopathy. + large nodular goiter Cor: PMI nondisplaced. Regular rate & rhythm. No rubs or murmurs.  Lungs: clear Abdomen: soft, nontender, obese, nondistended. No hepatosplenomegaly. No bruits or masses. Good bowel sounds. Extremities: no cyanosis, clubbing, rash., no edema  Neuro: alert & oriented x 3, cranial nerves grossly intact. moves all 4 extremities w/o difficulty. Affect pleasant.   ASSESSMENT & PLAN: 1. Chronic systolic HF    --Overall doing much better. EF now essentially normal. On echo still with mild regional wall motion abnormality. Will proceed with cardiac MRI to more full evaluate. Continue current meds.  2. HTN    --Blood pressure well controlled. Continue current regimen.  3. ? H/o CAD   --No evidence of ischemia. Continue current regimen.   4. OSA  -- encouraged her to use CPAP as much as possible.  Truman Hayward 3:31 PM

## 2013-05-07 NOTE — Addendum Note (Signed)
Encounter addended by: Chauncey Cruel, RN on: 05/07/2013  3:38 PM<BR>     Documentation filed: Patient Instructions Section, Orders

## 2013-05-07 NOTE — Patient Instructions (Signed)
Cardiac MRI- We will call you once this is scheduled.  Your physician recommends that you schedule a follow-up appointment in: 6months.

## 2013-05-16 ENCOUNTER — Other Ambulatory Visit: Payer: Self-pay

## 2013-05-16 MED ORDER — LISINOPRIL 20 MG PO TABS: ORAL_TABLET | ORAL | Status: DC

## 2013-05-29 ENCOUNTER — Other Ambulatory Visit: Payer: Self-pay

## 2013-06-10 ENCOUNTER — Encounter: Payer: Self-pay | Admitting: Internal Medicine

## 2013-06-18 ENCOUNTER — Ambulatory Visit (HOSPITAL_COMMUNITY)
Admission: RE | Admit: 2013-06-18 | Discharge: 2013-06-18 | Disposition: A | Discharge status: Home / Self Care | Payer: 59 | Source: Ambulatory Visit | Attending: Internal Medicine | Admitting: Internal Medicine

## 2013-06-18 DIAGNOSIS — I428 Other cardiomyopathies: Secondary | ICD-10-CM

## 2013-06-18 DIAGNOSIS — I5022 Chronic systolic (congestive) heart failure: Secondary | ICD-10-CM

## 2013-06-18 LAB — CREATININE, SERUM
Creatinine, Ser: 0.64 mg/dL (ref 0.50–1.10)
GFR calc Af Amer: >90 mL/min (ref >90)

## 2013-06-18 MED ORDER — GADOBENATE DIMEGLUMINE 529 MG/ML IV SOLN
30.0000 mL | Freq: Once | INTRAVENOUS | Status: AC
  Administered 2013-06-18: 30 mL via INTRAVENOUS

## 2013-07-04 ENCOUNTER — Telehealth (HOSPITAL_COMMUNITY): Payer: Self-pay | Admitting: Cardiology

## 2013-07-04 NOTE — Telephone Encounter (Signed)
Pt aware of cardiac mri results

## 2013-07-04 NOTE — Patient Instructions (Addendum)
Pt aware of cardiac mri results 

## 2013-07-04 NOTE — Telephone Encounter (Signed)
Message copied by Korie Brabson, Milagros Reap on Fri Jul 04, 2013  4:02 PM ------      Message from: Arvilla Meres R      Created: Thu Jun 19, 2013  7:29 PM       EF low normal. No infiltrative process or scar. ------

## 2013-07-09 ENCOUNTER — Other Ambulatory Visit (HOSPITAL_COMMUNITY): Payer: Self-pay | Admitting: Anesthesiology

## 2013-07-10 ENCOUNTER — Other Ambulatory Visit (HOSPITAL_COMMUNITY): Payer: Self-pay | Admitting: *Deleted

## 2013-07-10 MED ORDER — CARVEDILOL 25 MG PO TABS: ORAL_TABLET | ORAL | Status: DC

## 2013-12-04 ENCOUNTER — Encounter: Payer: Self-pay | Admitting: Gynecology

## 2013-12-04 ENCOUNTER — Ambulatory Visit (INDEPENDENT_AMBULATORY_CARE_PROVIDER_SITE_OTHER): Payer: 59 | Admitting: Gynecology

## 2013-12-04 VITALS — BP 146/90

## 2013-12-04 DIAGNOSIS — N760 Acute vaginitis: Secondary | ICD-10-CM

## 2013-12-04 DIAGNOSIS — N9089 Other specified noninflammatory disorders of vulva and perineum: Secondary | ICD-10-CM

## 2013-12-04 LAB — WET PREP FOR TRICH, YEAST, CLUE
CLUE CELLS WET PREP: NONE SEEN
TRICH WET PREP: NONE SEEN
WBC WET PREP: NONE SEEN
Yeast Wet Prep HPF POC: NONE SEEN

## 2013-12-04 MED ORDER — VALACYCLOVIR HCL 1 G PO TABS: 1000.0000 mg | ORAL_TABLET | Freq: Two times a day (BID) | ORAL | Status: DC

## 2013-12-04 MED ORDER — LIDOCAINE HCL 2 % EX GEL: 1.0000 "application " | CUTANEOUS | Status: DC | PRN

## 2013-12-04 NOTE — Addendum Note (Signed)
Addended by: Thurnell Garbe A on: 12/04/2013 03:38 PM   Modules accepted: Orders

## 2013-12-04 NOTE — Patient Instructions (Signed)
Genital Herpes  Genital herpes is a sexually transmitted disease. This means that it is a disease passed by having sex with an infected person. There is no cure for genital herpes. The time between attacks can be months to years. The virus may live in a person but produce no problems (symptoms). This infection can be passed to a baby as it travels down the birth canal (vagina). In a newborn, this can cause central nervous system damage, eye damage, or even death. The virus that causes genital herpes is usually HSV-2 virus. The virus that causes oral herpes is usually HSV-1. The diagnosis (learning what is wrong) is made through culture results.  SYMPTOMS   Usually symptoms of pain and itching begin a few days to a week after contact. It first appears as small blisters that progress to small painful ulcers which then scab over and heal after several days. It affects the outer genitalia, birth canal, cervix, penis, anal area, buttocks, and thighs.  HOME CARE INSTRUCTIONS   · Keep ulcerated areas dry and clean.  · Take medications as directed. Antiviral medications can speed up healing. They will not prevent recurrences or cure this infection. These medications can also be taken for suppression if there are frequent recurrences.  · While the infection is active, it is contagious. Avoid all sexual contact during active infections.  · Condoms may help prevent spread of the herpes virus.  · Practice safe sex.  · Wash your hands thoroughly after touching the genital area.  · Avoid touching your eyes after touching your genital area.  · Inform your caregiver if you have had genital herpes and become pregnant. It is your responsibility to insure a safe outcome for your baby in this pregnancy.  · Only take over-the-counter or prescription medicines for pain, discomfort, or fever as directed by your caregiver.  SEEK MEDICAL CARE IF:   · You have a recurrence of this infection.  · You do not respond to medications and are not  improving.  · You have new sources of pain or discharge which have changed from the original infection.  · You have an oral temperature above 102° F (38.9° C).  · You develop abdominal pain.  · You develop eye pain or signs of eye infection.  Document Released: 07/07/2000 Document Revised: 10/02/2011 Document Reviewed: 07/28/2009  ExitCare® Patient Information ©2014 [employer].

## 2013-12-04 NOTE — Progress Notes (Signed)
    patient is a 48 year old that presented to the office stating that approximately a week ago she started noticing some irritation on her external genitalia and tried Vagisil and then a Monistat cream and then Vagisil again and suppository and some cream for yeast that she cannot recall the name and the sensitivity is less but still present. She states that she has not had any new sexual partner. She has had a previous hysterectomy. She denied any dysuria or frequency.  Exam: Bartholin urethra Skene was within normal limits Inferior portion of right labia majora herpetic like lesions were noted x3 very tender to touch the rest of the external genitalia was normal there was no lesions in the vagina a wet prep was done which was negative.  Patient took a selfie picture of the area when the symptoms for started and it appears to be herpetic lesions. Herpes culture was obtained today.  Assessment/plan external genital lesions highly suspicious for herpes simplex culture obtained result pending. She will be started on Valtrex 1 g twice a day for 7 days and she can apply 2% lidocaine gel to the external genitalia when necessary. Literature information was provided

## 2013-12-05 ENCOUNTER — Telehealth: Payer: Self-pay | Admitting: *Deleted

## 2013-12-05 NOTE — Telephone Encounter (Signed)
Pt was prescribed generic valtrex 1,000 mg yesterday at [facility]. Pt said medication is $135 for 14 pills that too expensive she is without insurance at the time. Would like to know if something else could be sent? Please advise

## 2013-12-05 NOTE — Telephone Encounter (Signed)
Pt will go online to [email or web address] to get coupon for medication per JF request. It will be $45. Pt aware and will do

## 2013-12-08 ENCOUNTER — Encounter: Payer: Self-pay | Admitting: Gynecology

## 2013-12-10 ENCOUNTER — Telehealth: Payer: Self-pay

## 2013-12-10 NOTE — Telephone Encounter (Signed)
Patient sent e-mail today attached to her patient [place]. "I am so disappointed. I have been so upset and depressed since I found this information out to be true.Now I am so distraught that I don't want to do anything, I have lost all interest. I just keep crying. I am scared to come back to my next appointment cause y'all may find something else."  She recently was told HSV culture positive and you recommended she return for full STD testing and she has appointment.

## 2013-12-10 NOTE — Telephone Encounter (Signed)
Patient called. SHe admits she is having a hard time dealing with the diagnosis and that it is something she will have forever and that she will need to share with future partners.  She said she has a very complicated life right now and she did not need one more thing to add to it and this has made it really hard.  I reassured patient that HSV diagnosis is very common and HSV is very manageable.  Does not have to be a big part of her life although it definitely impacts her life in some ways.  I offered her to come in and talk with Dr. Moshe Salisbury or Izora Gala.  I offered her appt to talk with Marya Amsler, counselor.  Patient said she doesn't want anyone else to know. I reassured her that these folks are medical professionals and everything is confidential.  She declines.  I offered her to schedule her next appt a little earlier since she thinks HSV outbreak is gone now. She declines that as well as she needs to work out her finances.  I told her she had my number and to call me if we can be of any assistance.

## 2013-12-10 NOTE — Telephone Encounter (Signed)
Called Patient. Left message on voice mail that I received her email about how upset she is and would like her to call me so I can talk with her.

## 2013-12-10 NOTE — Telephone Encounter (Signed)
Please give her reassurance that she can come by the office and we can talk at the same time she comes to get her blood work done and additional cultures.

## 2013-12-10 NOTE — Telephone Encounter (Signed)
Error-already have encounter open.

## 2013-12-25 ENCOUNTER — Ambulatory Visit (INDEPENDENT_AMBULATORY_CARE_PROVIDER_SITE_OTHER): Payer: Self-pay | Admitting: Gynecology

## 2013-12-25 ENCOUNTER — Encounter: Payer: Self-pay | Admitting: Gynecology

## 2013-12-25 VITALS — BP 126/88

## 2013-12-25 DIAGNOSIS — B373 Candidiasis of vulva and vagina: Secondary | ICD-10-CM

## 2013-12-25 DIAGNOSIS — L293 Anogenital pruritus, unspecified: Secondary | ICD-10-CM

## 2013-12-25 DIAGNOSIS — Z113 Encounter for screening for infections with a predominantly sexual mode of transmission: Secondary | ICD-10-CM

## 2013-12-25 DIAGNOSIS — N898 Other specified noninflammatory disorders of vagina: Secondary | ICD-10-CM

## 2013-12-25 DIAGNOSIS — B3731 Acute candidiasis of vulva and vagina: Secondary | ICD-10-CM

## 2013-12-25 DIAGNOSIS — L292 Pruritus vulvae: Secondary | ICD-10-CM

## 2013-12-25 DIAGNOSIS — N951 Menopausal and female climacteric states: Secondary | ICD-10-CM

## 2013-12-25 DIAGNOSIS — Z8619 Personal history of other infectious and parasitic diseases: Secondary | ICD-10-CM

## 2013-12-25 LAB — WET PREP FOR TRICH, YEAST, CLUE
Clue Cells Wet Prep HPF POC: NONE SEEN
TRICH WET PREP: NONE SEEN

## 2013-12-25 NOTE — Progress Notes (Signed)
   Patient presented to the office today complaining of vulvar irritation and pruritus but no true discharge. Patient last month was diagnosed with herpes simplex genitalia and was treated with Valtrex. She is here also today for full STD screening. Patient also has been complaining of issues of hot flashes mood swing her irritability. Patient with past history of laparoscopic-assisted vaginal hysterectomy.  Exam: Bartholin urethra Skene was within normal limits Extremities genitalia slightly erythematous but no gross lesions seen Vagina: No lesions only slight white thick discharge noted vaginal cuff intact Bimanual exam not done Rectal exam not done  Wet prep moderate yeast  Assessment/plan: #1 STD last month HSV treated no evidence of recurrence. To complete STD screening a GC and chlamydia culture along with HIV RPR hepatitis B and C. was ordered today. #2 monilial vaginitis will be treated with Monistat vaginal cream twice a day first 5-7 days. #3 menopausal symptoms we'll check an [place] and TSH today and patient will return the next month for full annual exam and we'll discuss HRT at that time. Ligature information was provided.

## 2013-12-25 NOTE — Patient Instructions (Signed)
Hormone Therapy At menopause, your body begins making less estrogen and progesterone hormones. This causes the body to stop having menstrual periods. This is because estrogen and progesterone hormones control your periods and menstrual cycle. A lack of estrogen may cause symptoms such as:  Hot flushes (or hot flashes).  Vaginal dryness.  Dry skin.  Loss of sex drive.  Risk of bone loss (osteoporosis). When this happens, you may choose to take hormone therapy to get back the estrogen lost during menopause. When the hormone estrogen is given alone, it is usually referred to as ET (Estrogen Therapy). When the hormone progestin is combined with estrogen, it is generally called HT (Hormone Therapy). This was formerly known as hormone replacement therapy (HRT). Your caregiver can help you make a decision on what will be best for you. The decision to use HT seems to change often as new studies are done. Many studies do not agree on the benefits of hormone replacement therapy. LIKELY BENEFITS OF HT INCLUDE PROTECTION FROM:  Hot Flushes (also called hot flashes) - A hot flush is a sudden feeling of heat that spreads over the face and body. The skin may redden like a blush. It is connected with sweats and sleep disturbance. Women going through menopause may have hot flushes a few times a month or several times per day depending on the woman.  Osteoporosis (bone loss)- Estrogen helps guard against bone loss. After menopause, a woman's bones slowly lose calcium and become weak and brittle. As a result, bones are more likely to break. The hip, wrist, and spine are affected most often. Hormone therapy can help slow bone loss after menopause. Weight bearing exercise and taking calcium with vitamin D also can help prevent bone loss. There are also medications that your caregiver can prescribe that can help prevent osteoporosis.  Vaginal Dryness - Loss of estrogen causes changes in the vagina. Its lining may  become thin and dry. These changes can cause pain and bleeding during sexual intercourse. Dryness can also lead to infections. This can cause burning and itching. (Vaginal estrogen treatment can help relieve pain, itching, and dryness.)  Urinary Tract Infections are more common after menopause because of lack of estrogen. Some women also develop urinary incontinence because of low estrogen levels in the vagina and bladder.  Possible other benefits of estrogen include a positive effect on mood and short-term memory in women. RISKS AND COMPLICATIONS  Using estrogen alone without progesterone causes the lining of the uterus to grow. This increases the risk of lining of the uterus (endometrial) cancer. Your caregiver should give another hormone called progestin if you have a uterus.  Women who take combined (estrogen and progestin) HT appear to have an increased risk of breast cancer. The risk appears to be small, but increases throughout the time that HT is taken.  Combined therapy also makes the breast tissue slightly denser which makes it harder to read mammograms (breast X-rays).  Combined, estrogen and progesterone therapy can be taken together every day, in which case there may be spotting of blood. HT therapy can be taken cyclically in which case you will have menstrual periods. Cyclically means HT is taken for a set amount of days, then not taken, then this process is repeated.  HT may increase the risk of stroke, heart attack, breast cancer and forming blood clots in your leg.  Transdermal estrogen (estrogen that is absorbed through the skin with a patch or a cream) may have more positive results with:    Cholesterol.  Blood pressure.  Blood clots. Having the following conditions may indicate you should not have HT:  Endometrial cancer.  Liver disease.  Breast cancer.  Heart disease.  History of blood clots.  Stroke. TREATMENT   If you choose to take HT and have a uterus,  usually estrogen and progestin are prescribed.  Your caregiver will help you decide the best way to take the medications.  Possible ways to take estrogen include:  Pills.  Patches.  Gels.  Sprays.  Vaginal estrogen cream, rings and tablets.  It is best to take the lowest dose possible that will help your symptoms and take them for the shortest period of time that you can.  Hormone therapy can help relieve some of the problems (symptoms) that affect women at menopause. Before making a decision about HT, talk to your caregiver about what is best for you. Be well informed and comfortable with your decisions. HOME CARE INSTRUCTIONS   Follow your caregivers advice when taking the medications.  A Pap test is done to screen for cervical cancer.  The first Pap test should be done at age 15.  Between ages 65 and 68, Pap tests are repeated every 2 years.  Beginning at age 27, you are advised to have a Pap test every 3 years as long as your past 3 Pap tests have been normal.  Some women have medical problems that increase the chance of getting cervical cancer. Talk to your caregiver about these problems. It is especially important to talk to your caregiver if a new problem develops soon after your last Pap test. In these cases, your caregiver may recommend more frequent screening and Pap tests.  The above recommendations are the same for women who have or have not gotten the vaccine for HPV (Human Papillomavirus).  If you had a hysterectomy for a problem that was not a cancer or a condition that could lead to cancer, then you no longer need Pap tests. However, even if you no longer need a Pap test, a regular exam is a good idea to make sure no other problems are starting.   If you are between ages 58 and 56, and you have had normal Pap tests going back 10 years, you no longer need Pap tests. However, even if you no longer need a Pap test, a regular exam is a good idea to make sure no  other problems are starting.   If you have had past treatment for cervical cancer or a condition that could lead to cancer, you need Pap tests and screening for cancer for at least 20 years after your treatment.  If Pap tests have been discontinued, risk factors (such as a new sexual partner) need to be re-assessed to determine if screening should be resumed.  Some women may need screenings more often if they are at high risk for cervical cancer.  Get mammograms done as per the advice of your caregiver. SEEK IMMEDIATE MEDICAL CARE IF:  You develop abnormal vaginal bleeding.  You have pain or swelling in your legs, shortness of breath, or chest pain.  You develop dizziness or headaches.  You have lumps or changes in your breasts or armpits.  You have slurred speech.  You develop weakness or numbness of your arms or legs.  You have pain, burning, or bleeding when urinating.  You develop abdominal pain. Document Released: 04/08/2003 Document Revised: 10/02/2011 Document Reviewed: 07/27/2010 [facility] Patient Information 2014 [place], [place]. Menopause Menopause is the normal time  of life when menstrual periods stop completely. Menopause is complete when you have missed 12 consecutive menstrual periods. It usually occurs between the ages of 43 years and 80 years. Very rarely does a woman develop menopause before the age of 39 years. At menopause, your ovaries stop producing the female hormones estrogen and progesterone. This can cause undesirable symptoms and also affect your health. Sometimes the symptoms may occur 4 5 years before the menopause begins. There is no relationship between menopause and:  Oral contraceptives.  Number of children you had.  Race.  The age your menstrual periods started (menarche). Heavy smokers and very thin women may develop menopause earlier in life. CAUSES  The ovaries stop producing the female hormones estrogen and progesterone.  Other causes  include:  Surgery to remove both ovaries.  The ovaries stop functioning for no known reason.  Tumors of the pituitary gland in the brain.  Medical disease that affects the ovaries and hormone production.  Radiation treatment to the abdomen or pelvis.  Chemotherapy that affects the ovaries. SYMPTOMS   Hot flashes.  Night sweats.  Decrease in sex drive.  Vaginal dryness and thinning of the vagina causing painful intercourse.  Dryness of the skin and developing wrinkles.  Headaches.  Tiredness.  Irritability.  Memory problems.  Weight gain.  Bladder infections.  Hair growth of the face and chest.  Infertility. More serious symptoms include:  Loss of bone (osteoporosis) causing breaks (fractures).  Depression.  Hardening and narrowing of the arteries (atherosclerosis) causing heart attacks and strokes. DIAGNOSIS   When the menstrual periods have stopped for 12 straight months.  Physical exam.  Hormone studies of the blood. TREATMENT  There are many treatment choices and nearly as many questions about them. The decisions to treat or not to treat menopausal changes is an individual choice made with your health care provider. Your health care provider can discuss the treatments with you. Together, you can decide which treatment will work best for you. Your treatment choices may include:   Hormone therapy (estrogen and progesterone).  Non-hormonal medicines.  Treating the individual symptoms with medicine (for example antidepressants for depression).  Herbal medicines that may help specific symptoms.  Counseling by a psychiatrist or psychologist.  Group therapy.  Lifestyle changes including:  Eating healthy.  Regular exercise.  Limiting caffeine and alcohol.  Stress management and meditation.  No treatment. HOME CARE INSTRUCTIONS   Take the medicine your health care provider gives you as directed.  Get plenty of sleep and rest.  Exercise  regularly.  Eat a diet that contains calcium (good for the bones) and soy products (acts like estrogen hormone).  Avoid alcoholic beverages.  Do not smoke.  If you have hot flashes, dress in layers.  Take supplements, calcium, and vitamin D to strengthen bones.  You can use over-the-counter lubricants or moisturizers for vaginal dryness.  Group therapy is sometimes very helpful.  Acupuncture may be helpful in some cases. SEEK MEDICAL CARE IF:   You are not sure you are in menopause.  You are having menopausal symptoms and need advice and treatment.  You are still having menstrual periods after age 35 years.  You have pain with intercourse.  Menopause is complete (no menstrual period for 12 months) and you develop vaginal bleeding.  You need a referral to a specialist (gynecologist, psychiatrist, or psychologist) for treatment. SEEK IMMEDIATE MEDICAL CARE IF:   You have severe depression.  You have excessive vaginal bleeding.  You fell and think  you have a broken bone.  You have pain when you urinate.  You develop leg or chest pain.  You have a fast pounding heart beat (palpitations).  You have severe headaches.  You develop vision problems.  You feel a lump in your breast.  You have abdominal pain or severe indigestion. Document Released: 09/30/2003 Document Revised: 03/12/2013 Document Reviewed: 02/06/2013 [employer] Patient Information 2014 [employer].  

## 2013-12-26 LAB — TSH: TSH: 1.034 u[IU]/mL (ref 0.350–4.500)

## 2013-12-26 LAB — GC/CHLAMYDIA PROBE AMP
CT PROBE, AMP APTIMA: NEGATIVE
GC PROBE AMP APTIMA: NEGATIVE

## 2013-12-26 LAB — HIV ANTIBODY (ROUTINE TESTING W REFLEX): HIV: NONREACTIVE

## 2013-12-26 LAB — HEPATITIS B SURFACE ANTIGEN: HEP B S AG: NEGATIVE

## 2013-12-26 LAB — RPR

## 2013-12-26 LAB — FOLLICLE STIMULATING HORMONE: FSH: 26 m[IU]/mL

## 2013-12-26 LAB — HEPATITIS C ANTIBODY: HCV Ab: NEGATIVE

## 2014-01-27 ENCOUNTER — Encounter: Payer: Self-pay | Admitting: Gynecology

## 2014-03-05 ENCOUNTER — Telehealth: Payer: Self-pay

## 2014-03-05 ENCOUNTER — Encounter: Payer: Self-pay | Admitting: Gynecology

## 2014-03-05 ENCOUNTER — Ambulatory Visit (INDEPENDENT_AMBULATORY_CARE_PROVIDER_SITE_OTHER): Payer: Self-pay | Admitting: Gynecology

## 2014-03-05 VITALS — BP 142/88

## 2014-03-05 DIAGNOSIS — N952 Postmenopausal atrophic vaginitis: Secondary | ICD-10-CM

## 2014-03-05 DIAGNOSIS — N760 Acute vaginitis: Secondary | ICD-10-CM

## 2014-03-05 DIAGNOSIS — N951 Menopausal and female climacteric states: Secondary | ICD-10-CM

## 2014-03-05 DIAGNOSIS — L293 Anogenital pruritus, unspecified: Secondary | ICD-10-CM

## 2014-03-05 DIAGNOSIS — N898 Other specified noninflammatory disorders of vagina: Secondary | ICD-10-CM

## 2014-03-05 LAB — WET PREP FOR TRICH, YEAST, CLUE
TRICH WET PREP: NONE SEEN
WBC, Wet Prep HPF POC: NONE SEEN
Yeast Wet Prep HPF POC: NONE SEEN

## 2014-03-05 MED ORDER — CLOBETASOL PROPIONATE 0.05 % EX CREA: 1.0000 "application " | TOPICAL_CREAM | Freq: Two times a day (BID) | CUTANEOUS | Status: DC

## 2014-03-05 MED ORDER — FLUCONAZOLE 150 MG PO TABS: ORAL_TABLET | ORAL | Status: DC

## 2014-03-05 MED ORDER — NONFORMULARY OR COMPOUNDED ITEM: Status: DC

## 2014-03-05 MED ORDER — VENLAFAXINE HCL ER 37.5 MG PO CP24: 37.5000 mg | ORAL_CAPSULE | Freq: Every day | ORAL | Status: DC

## 2014-03-05 NOTE — Telephone Encounter (Signed)
Patient called c/o what she feels are sx of menopause--not sleeping at night, skin hot and irritated like it is dry. She is very miserable and wants to see if she can come in before the 03/17/14 office visit she has scheduled.  Butch Penny will talk with her to get her in sooner.

## 2014-03-05 NOTE — Progress Notes (Signed)
   48 year old perimenopausal patient presenting to the office with excruciating vulvar pruritus but no vaginal discharge. Patient had similar episode in June of this year and was found to have yeast infection and was prescribed Monistat vaginal cream to apply each bedtime for 7 days. She stated the symptoms improved but they have returned. She had an [place] and TSH which were drawn at that time which demonstrated that her [facility] was elevated with a value of 26. Patient had a negative GC, chlamydia culture, HIV, RPR, hepatitis B and C June 4 of this year.  Patient has been having vasomotor symptoms would wake her up at night. Her mother had breast cancer so she has been scared of hormone replacement therapy.Patient with prior history of laparoscopic-assisted vaginal hysterectomy and she thinks that it was the left tube and ovary there were removed at the same time for benign pathology.    Exam: External genital excoriated area female this with paper cut like areas from scratching. Vagina: No lesions or discharge. Wet prep was negative.  Assessment/plan: Atrophic vaginitis in this menopausal patient. We discussed starting her on clobetasol 0.05% to apply twice a day for 7-10 days and at the same time Diflucan 150 mg 1 by mouth every other day for 3 days in the event of underlying yeast that was not picked up. A wet prep. Once she completes this would want to start her on low-dose vaginal estrogen 0.02% twice a week. For her vasomotor symptoms she will be started on Effexor extended release 37.5 mg 1 by mouth daily. Patient returned back to the office next month for her overdue annual exam. Literature information on all the above was provided.

## 2014-03-17 ENCOUNTER — Ambulatory Visit: Payer: Self-pay | Admitting: Gynecology

## 2014-04-28 ENCOUNTER — Encounter (HOSPITAL_COMMUNITY): Payer: Self-pay | Admitting: Emergency Medicine

## 2014-04-28 ENCOUNTER — Emergency Department (HOSPITAL_COMMUNITY)
Admission: EM | Admit: 2014-04-28 | Discharge: 2014-04-28 | Disposition: A | Discharge status: Home / Self Care | Payer: 59 | Attending: Emergency Medicine | Admitting: Emergency Medicine

## 2014-04-28 DIAGNOSIS — R0789 Other chest pain: Secondary | ICD-10-CM | POA: Insufficient documentation

## 2014-04-28 DIAGNOSIS — J018 Other acute sinusitis: Secondary | ICD-10-CM

## 2014-04-28 DIAGNOSIS — F419 Anxiety disorder, unspecified: Secondary | ICD-10-CM | POA: Insufficient documentation

## 2014-04-28 DIAGNOSIS — E669 Obesity, unspecified: Secondary | ICD-10-CM | POA: Insufficient documentation

## 2014-04-28 DIAGNOSIS — I10 Essential (primary) hypertension: Secondary | ICD-10-CM | POA: Insufficient documentation

## 2014-04-28 DIAGNOSIS — Z79899 Other long term (current) drug therapy: Secondary | ICD-10-CM | POA: Insufficient documentation

## 2014-04-28 DIAGNOSIS — F329 Major depressive disorder, single episode, unspecified: Secondary | ICD-10-CM | POA: Insufficient documentation

## 2014-04-28 DIAGNOSIS — R011 Cardiac murmur, unspecified: Secondary | ICD-10-CM | POA: Insufficient documentation

## 2014-04-28 DIAGNOSIS — Z8619 Personal history of other infectious and parasitic diseases: Secondary | ICD-10-CM | POA: Insufficient documentation

## 2014-04-28 DIAGNOSIS — Z7982 Long term (current) use of aspirin: Secondary | ICD-10-CM | POA: Insufficient documentation

## 2014-04-28 DIAGNOSIS — J45909 Unspecified asthma, uncomplicated: Secondary | ICD-10-CM | POA: Insufficient documentation

## 2014-04-28 DIAGNOSIS — Z8719 Personal history of other diseases of the digestive system: Secondary | ICD-10-CM | POA: Insufficient documentation

## 2014-04-28 DIAGNOSIS — I5020 Unspecified systolic (congestive) heart failure: Secondary | ICD-10-CM | POA: Insufficient documentation

## 2014-04-28 DIAGNOSIS — E119 Type 2 diabetes mellitus without complications: Secondary | ICD-10-CM | POA: Insufficient documentation

## 2014-04-28 MED ORDER — FLUTICASONE PROPIONATE 50 MCG/ACT NA SUSP: 2.0000 | Freq: Every day | NASAL | Status: DC

## 2014-04-28 MED ORDER — ALBUTEROL SULFATE HFA 108 (90 BASE) MCG/ACT IN AERS
2.0000 | INHALATION_SPRAY | Freq: Once | RESPIRATORY_TRACT | Status: AC
  Administered 2014-04-28: 2 via RESPIRATORY_TRACT
  Filled 2014-04-28: qty 6.7

## 2014-04-28 MED ORDER — AMOXICILLIN 500 MG PO CAPS: 500.0000 mg | ORAL_CAPSULE | Freq: Three times a day (TID) | ORAL | Status: DC

## 2014-04-28 NOTE — ED Provider Notes (Signed)
Medical screening examination/treatment/procedure(s) were performed by non-physician practitioner and as supervising physician I was immediately available for consultation/collaboration.    Dorie Rank, MD 04/28/14 [phone]

## 2014-04-28 NOTE — Discharge Instructions (Signed)
Take amoxicillin as directed to completion. Use nasal spray as directed. You may also use saline nasal rinses and cool mist humidifiers.  Sinusitis Sinusitis is redness, soreness, and inflammation of the paranasal sinuses. Paranasal sinuses are air pockets within the bones of your face (beneath the eyes, the middle of the forehead, or above the eyes). In healthy paranasal sinuses, mucus is able to drain out, and air is able to circulate through them by way of your nose. However, when your paranasal sinuses are inflamed, mucus and air can become trapped. This can allow bacteria and other germs to grow and cause infection. Sinusitis can develop quickly and last only a short time (acute) or continue over a long period (chronic). Sinusitis that lasts for more than 12 weeks is considered chronic.  CAUSES  Causes of sinusitis include:  Allergies.  Structural abnormalities, such as displacement of the cartilage that separates your nostrils (deviated septum), which can decrease the air flow through your nose and sinuses and affect sinus drainage.  Functional abnormalities, such as when the small hairs (cilia) that line your sinuses and help remove mucus do not work properly or are not present. SIGNS AND SYMPTOMS  Symptoms of acute and chronic sinusitis are the same. The primary symptoms are pain and pressure around the affected sinuses. Other symptoms include:  Upper toothache.  Earache.  Headache.  Bad breath.  Decreased sense of smell and taste.  A cough, which worsens when you are lying flat.  Fatigue.  Fever.  Thick drainage from your nose, which often is green and may contain pus (purulent).  Swelling and warmth over the affected sinuses. DIAGNOSIS  Your health care provider will perform a physical exam. During the exam, your health care provider may:  Look in your nose for signs of abnormal growths in your nostrils (nasal polyps).  Tap over the affected sinus to check for signs  of infection.  View the inside of your sinuses (endoscopy) using an imaging device that has a light attached (endoscope). If your health care provider suspects that you have chronic sinusitis, one or more of the following tests may be recommended:  Allergy tests.  Nasal culture. A sample of mucus is taken from your nose, sent to a lab, and screened for bacteria.  Nasal cytology. A sample of mucus is taken from your nose and examined by your health care provider to determine if your sinusitis is related to an allergy. TREATMENT  Most cases of acute sinusitis are related to a viral infection and will resolve on their own within 10 days. Sometimes medicines are prescribed to help relieve symptoms (pain medicine, decongestants, nasal steroid sprays, or saline sprays).  However, for sinusitis related to a bacterial infection, your health care provider will prescribe antibiotic medicines. These are medicines that will help kill the bacteria causing the infection.  Rarely, sinusitis is caused by a fungal infection. In theses cases, your health care provider will prescribe antifungal medicine. For some cases of chronic sinusitis, surgery is needed. Generally, these are cases in which sinusitis recurs more than 3 times per year, despite other treatments. HOME CARE INSTRUCTIONS   Drink plenty of water. Water helps thin the mucus so your sinuses can drain more easily.  Use a humidifier.  Inhale steam 3 to 4 times a day (for example, sit in the bathroom with the shower running).  Apply a warm, moist washcloth to your face 3 to 4 times a day, or as directed by your health care provider.  Use saline nasal sprays to help moisten and clean your sinuses.  Take medicines only as directed by your health care provider.  If you were prescribed either an antibiotic or antifungal medicine, finish it all even if you start to feel better. SEEK IMMEDIATE MEDICAL CARE IF:  You have increasing pain or severe  headaches.  You have nausea, vomiting, or drowsiness.  You have swelling around your face.  You have vision problems.  You have a stiff neck.  You have difficulty breathing. MAKE SURE YOU:   Understand these instructions.  Will watch your condition.  Will get help right away if you are not doing well or get worse. Document Released: 07/10/2005 Document Revised: 11/24/2013 Document Reviewed: 07/25/2011 [facility] Patient Information 2015 [place], [place]. This information is not intended to replace advice given to you by your health care provider. Make sure you discuss any questions you have with your health care provider.  Upper Respiratory Infection, Adult An upper respiratory infection (URI) is also sometimes known as the common cold. The upper respiratory tract includes the nose, sinuses, throat, trachea, and bronchi. Bronchi are the airways leading to the lungs. Most people improve within 1 week, but symptoms can last up to 2 weeks. A residual cough may last even longer.  CAUSES Many different viruses can infect the tissues lining the upper respiratory tract. The tissues become irritated and inflamed and often become very moist. Mucus production is also common. A cold is contagious. You can easily spread the virus to others by oral contact. This includes kissing, sharing a glass, coughing, or sneezing. Touching your mouth or nose and then touching a surface, which is then touched by another person, can also spread the virus. SYMPTOMS  Symptoms typically develop 1 to 3 days after you come in contact with a cold virus. Symptoms vary from person to person. They may include:  Runny nose.  Sneezing.  Nasal congestion.  Sinus irritation.  Sore throat.  Loss of voice (laryngitis).  Cough.  Fatigue.  Muscle aches.  Loss of appetite.  Headache.  Low-grade fever. DIAGNOSIS  You might diagnose your own cold based on familiar symptoms, since most people get a cold 2 to 3  times a year. Your caregiver can confirm this based on your exam. Most importantly, your caregiver can check that your symptoms are not due to another disease such as strep throat, sinusitis, pneumonia, asthma, or epiglottitis. Blood tests, throat tests, and X-rays are not necessary to diagnose a common cold, but they may sometimes be helpful in excluding other more serious diseases. Your caregiver will decide if any further tests are required. RISKS AND COMPLICATIONS  You may be at risk for a more severe case of the common cold if you smoke cigarettes, have chronic heart disease (such as heart failure) or lung disease (such as asthma), or if you have a weakened immune system. The very young and very old are also at risk for more serious infections. Bacterial sinusitis, middle ear infections, and bacterial pneumonia can complicate the common cold. The common cold can worsen asthma and chronic obstructive pulmonary disease (COPD). Sometimes, these complications can require emergency medical care and may be life-threatening. PREVENTION  The best way to protect against getting a cold is to practice good hygiene. Avoid oral or hand contact with people with cold symptoms. Wash your hands often if contact occurs. There is no clear evidence that vitamin C, vitamin E, echinacea, or exercise reduces the chance of developing a cold. However, it  is always recommended to get plenty of rest and practice good nutrition. TREATMENT  Treatment is directed at relieving symptoms. There is no cure. Antibiotics are not effective, because the infection is caused by a virus, not by bacteria. Treatment may include:  Increased fluid intake. Sports drinks offer valuable electrolytes, sugars, and fluids.  Breathing heated mist or steam (vaporizer or shower).  Eating chicken soup or other clear broths, and maintaining good nutrition.  Getting plenty of rest.  Using gargles or lozenges for comfort.  Controlling fevers with  ibuprofen or acetaminophen as directed by your caregiver.  Increasing usage of your inhaler if you have asthma. Zinc gel and zinc lozenges, taken in the first 24 hours of the common cold, can shorten the duration and lessen the severity of symptoms. Pain medicines may help with fever, muscle aches, and throat pain. A variety of non-prescription medicines are available to treat congestion and runny nose. Your caregiver can make recommendations and may suggest nasal or lung inhalers for other symptoms.  HOME CARE INSTRUCTIONS   Only take over-the-counter or prescription medicines for pain, discomfort, or fever as directed by your caregiver.  Use a warm mist humidifier or inhale steam from a shower to increase air moisture. This may keep secretions moist and make it easier to breathe.  Drink enough water and fluids to keep your urine clear or pale yellow.  Rest as needed.  Return to work when your temperature has returned to normal or as your caregiver advises. You may need to stay home longer to avoid infecting others. You can also use a face mask and careful hand washing to prevent spread of the virus. SEEK MEDICAL CARE IF:   After the first few days, you feel you are getting worse rather than better.  You need your caregiver's advice about medicines to control symptoms.  You develop chills, worsening shortness of breath, or brown or red sputum. These may be signs of pneumonia.  You develop yellow or brown nasal discharge or pain in the face, especially when you bend forward. These may be signs of sinusitis.  You develop a fever, swollen neck glands, pain with swallowing, or white areas in the back of your throat. These may be signs of strep throat. SEEK IMMEDIATE MEDICAL CARE IF:   You have a fever.  You develop severe or persistent headache, ear pain, sinus pain, or chest pain.  You develop wheezing, a prolonged cough, cough up blood, or have a change in your usual mucus (if you have  chronic lung disease).  You develop sore muscles or a stiff neck. Document Released: 01/03/2001 Document Revised: 10/02/2011 Document Reviewed: 10/15/2013 [facility] Patient Information 2015 [place], [place]. This information is not intended to replace advice given to you by your health care provider. Make sure you discuss any questions you have with your health care provider.

## 2014-04-28 NOTE — Progress Notes (Signed)
Susan Peck,  Did not get to see patient but will be sending information on [place] program to help patient establish primary care, using the address provided.

## 2014-04-28 NOTE — ED Notes (Signed)
Pt states shortness of breath, cough, congestion x 1 week.  No fever.

## 2014-04-28 NOTE — ED Provider Notes (Signed)
CSN: [id number]     Arrival date & time 04/28/14  0847 History   First MD Initiated Contact with Patient 04/28/14 [phone]     Chief Complaint  Patient presents with  . Shortness of Breath  . Nasal Congestion  . Cough     (Consider location/radiation/quality/duration/timing/severity/associated sxs/prior Treatment) HPI Comments: This is a 48 year old female who presents to the emergency department complaining of sinus congestion x1 week, worsening over the past few days. Patient reports pressure behind her sinuses, nasal congestion and postnasal drip, states she has been coughing up yellow and green mucus and also blowing her nose with thick, dark mucus. She states her chest is starting to feel tight, however is not yet wheezing. She tried using her albuterol inhaler, however states this expired 2 years ago and is not helping. Denies fever, chills, nausea, vomiting. No sick contacts.  Patient is a 48 y.o. female presenting with shortness of breath and cough. The history is provided by the patient.  Shortness of Breath Associated symptoms: cough   Cough   Past Medical History  Diagnosis Date  . Systolic heart failure     May 2011 EF 35-40%, 04/03/11 EF 25-30%, 06/27/11 EF 30-35%  . Hypertension   . Asthma   . Obesity   . GERD (gastroesophageal reflux disease)   . Sleep apnea   . Heart murmur     dx 2 yrs ago per pt  . Seasonal allergies   . Shortness of breath     occasional - exercise induced  . Diabetes mellitus     recent dx 11/17/11 - started med 11/18/11  . Goiter 07/27/2011    non-neoplastic goiter - fine needle aspiration - benign  . Low back pain     history  . Anxiety     no meds  . Depression     no meds  . IBS (irritable bowel syndrome)     tx with diet per pt  . Herpes genitalis in women    Past Surgical History  Procedure Laterality Date  . Tubal ligation    . Diagnostic laparoscopy      of pelvis  . Novasure ablation      10/2005  . Dilation and curettage of  uterus  12/2006,  10/2005    hysteroscopy surgery x 2  . Svd      x 3  . Wisdom tooth extraction    . Colonscopy    . Cardiac catherization  2004    [place], [place] - Dr Lynnell Jude  . Laparoscopic assisted vaginal hysterectomy  11/23/2011    Procedure: LAPAROSCOPIC ASSISTED VAGINAL HYSTERECTOMY;  Surgeon: Jolayne Haines, MD;  Location: [facility];  Service: Gynecology;  Laterality: N/A;  . Cystoscopy  11/23/2011    Procedure: CYSTOSCOPY;  Surgeon: Jolayne Haines, MD;  Location: [facility];  Service: Gynecology;  Laterality: N/A;  . Salpingoophorectomy  11/23/2011    Procedure: SALPINGO OOPHERECTOMY;  Surgeon: Jolayne Haines, MD;  Location: [facility];  Service: Gynecology;  Laterality: Bilateral;  . Abdominal hysterectomy     Family History  Problem Relation Age of Onset  . Heart disease Maternal Aunt   . Breast cancer Mother   . Hypertension Maternal Grandmother   . Breast cancer Maternal Aunt    History  Substance Use Topics  . Smoking status: Never Smoker   . Smokeless tobacco: Never Used  . Alcohol Use: No   OB History   Grav Para Term Preterm Abortions TAB SAB Ect Mult  Living   7 3   3  1   3      Review of Systems  HENT: Positive for congestion, postnasal drip and sinus pressure.   Respiratory: Positive for cough and chest tightness.   All other systems reviewed and are negative.     Allergies  Review of patient's allergies indicates no known allergies.  Home Medications   Prior to Admission medications   Medication Sig Start Date End Date Taking? Authorizing Provider  albuterol (VENTOLIN HFA) 108 (90 BASE) MCG/ACT inhaler Inhale 2 puffs into the lungs every 6 (six) hours as needed. Takes for shortness of breath    Historical Provider, MD  amoxicillin (AMOXIL) 500 MG capsule Take 1 capsule (500 mg total) by mouth 3 (three) times daily. 04/28/14   Illene Labrador, PA-C  aspirin 81 MG chewable tablet Chew 81 mg by mouth daily.    Historical Provider, MD  carvedilol (COREG) 25 MG tablet  TAKE ONE TABLET BY MOUTH TWICE DAILY WITH MEALS 07/10/13   Jolaine Artist, MD  clobetasol cream (TEMOVATE) 2.83 % Apply 1 application topically 2 (two) times daily. 03/05/14   Terrance Mass, MD  fluconazole (DIFLUCAN) 150 MG tablet Please take one tablet every other day for 3 days 03/05/14   Terrance Mass, MD  fluticasone [facility]) 50 MCG/ACT nasal spray Place 2 sprays into both nostrils daily. 04/28/14   Illene Labrador, PA-C  furosemide (LASIX) 40 MG tablet TAKE ONE TABLET BY MOUTH ONCE DAILY 07/09/13   Jolaine Artist, MD  glyBURIDE (DIABETA) 2.5 MG tablet Take 2.5 mg by mouth daily with breakfast.    Historical Provider, MD  lidocaine (XYLOCAINE) 2 % jelly Apply 1 application topically as needed. Apply PRN 12/04/13   Terrance Mass, MD  lisinopril (PRINIVIL,ZESTRIL) 20 MG tablet Take 1 tab in am and 1/2 tab in pm 05/16/13   Jolaine Artist, MD  NONFORMULARY OR COMPOUNDED ITEM Estradiol 0.02 % 38ml prefilled applicator Sig: apply twice a week 03/05/14   Terrance Mass, MD  spironolactone (ALDACTONE) 25 MG tablet Take 1 tablet (25 mg total) by mouth daily. 04/16/13   Jolaine Artist, MD  valACYclovir (VALTREX) 1000 MG tablet Take 1 tablet (1,000 mg total) by mouth 2 (two) times daily. 12/04/13   Terrance Mass, MD  venlafaxine XR (EFFEXOR-XR) 37.5 MG 24 hr capsule Take 1 capsule (37.5 mg total) by mouth daily with breakfast. 03/05/14   Terrance Mass, MD   BP 153/100  Pulse 81  Temp(Src) 98 F (36.7 C) (Oral)  Resp 20  SpO2 98% Physical Exam  Nursing note and vitals reviewed. Constitutional: She is oriented to person, place, and time. She appears well-developed and well-nourished. No distress.  HENT:  Head: Normocephalic and atraumatic.  Nasal congestion, mucosal edema. Postnasal drip. Post oropharyngeal erythema without edema or exudate.  Eyes: Conjunctivae and EOM are normal.  Neck: Normal range of motion. Neck supple.  Cardiovascular: Normal rate, regular rhythm  and normal heart sounds.   Pulmonary/Chest: Effort normal and breath sounds normal. No respiratory distress. She has no wheezes.  Musculoskeletal: Normal range of motion. She exhibits no edema.  Neurological: She is alert and oriented to person, place, and time. No sensory deficit.  Skin: Skin is warm and dry.  Psychiatric: She has a normal mood and affect. Her behavior is normal.    ED Course  Procedures (including critical care time) Labs Review Labs Reviewed - No data to display  Imaging  Review No results found.   EKG Interpretation None      MDM   Final diagnoses:  Other acute sinusitis   Patient nontoxic appearing and in no apparent distress. Afebrile, hypertensive, vitals otherwise stable. Lungs clear. Given symptoms have been present for a week, colored mucus, sinus congestion, will treat with abx, advised mucinex. Rx for flonase, new albuterol inhaler. F/u with PCP. Return precautions given. Patient states understanding of treatment care plan and is agreeable.  Illene Labrador, PA-C 04/28/14 [phone]

## 2014-05-16 ENCOUNTER — Emergency Department (HOSPITAL_COMMUNITY): Payer: 59

## 2014-05-16 ENCOUNTER — Emergency Department (HOSPITAL_COMMUNITY)
Admission: EM | Admit: 2014-05-16 | Discharge: 2014-05-17 | Disposition: A | Discharge status: Home / Self Care | Payer: 59 | Attending: Emergency Medicine | Admitting: Emergency Medicine

## 2014-05-16 ENCOUNTER — Encounter (HOSPITAL_COMMUNITY): Payer: Self-pay | Admitting: Emergency Medicine

## 2014-05-16 DIAGNOSIS — J45901 Unspecified asthma with (acute) exacerbation: Secondary | ICD-10-CM | POA: Insufficient documentation

## 2014-05-16 DIAGNOSIS — Z9889 Other specified postprocedural states: Secondary | ICD-10-CM | POA: Insufficient documentation

## 2014-05-16 DIAGNOSIS — Z79899 Other long term (current) drug therapy: Secondary | ICD-10-CM | POA: Insufficient documentation

## 2014-05-16 DIAGNOSIS — R011 Cardiac murmur, unspecified: Secondary | ICD-10-CM | POA: Insufficient documentation

## 2014-05-16 DIAGNOSIS — Z8619 Personal history of other infectious and parasitic diseases: Secondary | ICD-10-CM | POA: Insufficient documentation

## 2014-05-16 DIAGNOSIS — Z8669 Personal history of other diseases of the nervous system and sense organs: Secondary | ICD-10-CM | POA: Insufficient documentation

## 2014-05-16 DIAGNOSIS — R0602 Shortness of breath: Secondary | ICD-10-CM

## 2014-05-16 DIAGNOSIS — Z7951 Long term (current) use of inhaled steroids: Secondary | ICD-10-CM | POA: Insufficient documentation

## 2014-05-16 DIAGNOSIS — E119 Type 2 diabetes mellitus without complications: Secondary | ICD-10-CM | POA: Insufficient documentation

## 2014-05-16 DIAGNOSIS — Z7982 Long term (current) use of aspirin: Secondary | ICD-10-CM | POA: Insufficient documentation

## 2014-05-16 DIAGNOSIS — Z8719 Personal history of other diseases of the digestive system: Secondary | ICD-10-CM | POA: Insufficient documentation

## 2014-05-16 DIAGNOSIS — I10 Essential (primary) hypertension: Secondary | ICD-10-CM | POA: Insufficient documentation

## 2014-05-16 DIAGNOSIS — Z8659 Personal history of other mental and behavioral disorders: Secondary | ICD-10-CM | POA: Insufficient documentation

## 2014-05-16 DIAGNOSIS — E669 Obesity, unspecified: Secondary | ICD-10-CM | POA: Insufficient documentation

## 2014-05-16 DIAGNOSIS — I5022 Chronic systolic (congestive) heart failure: Secondary | ICD-10-CM | POA: Insufficient documentation

## 2014-05-16 NOTE — ED Notes (Signed)
Pt reports shortness of breath which she was seen for on the 6th of this month and states that it has been on going since.  Pt reports she is not able to eat d/t the feeling of the food getting stuck or "getting pushed back up."  Pt also reports swelling in her legs which is not new.

## 2014-05-16 NOTE — ED Notes (Signed)
Pt reports she has not taken HTN meds for over 2 months.

## 2014-05-17 LAB — CBC WITH DIFFERENTIAL/PLATELET
BASOS ABS: 0 10*3/uL (ref 0.0–0.1)
Basophils Relative: 0 % (ref 0–1)
EOS ABS: 0.1 10*3/uL (ref 0.0–0.7)
EOS PCT: 1 % (ref 0–5)
HCT: 41.2 % (ref 36.0–46.0)
Hemoglobin: 14.5 g/dL (ref 12.0–15.0)
LYMPHS PCT: 53 % — AB (ref 12–46)
Lymphs Abs: 4.6 10*3/uL — ABNORMAL HIGH (ref 0.7–4.0)
MCH: 27.6 pg (ref 26.0–34.0)
MCHC: 35.2 g/dL (ref 30.0–36.0)
MCV: 78.5 fL (ref 78.0–100.0)
MONO ABS: 0.5 10*3/uL (ref 0.1–1.0)
Monocytes Relative: 5 % (ref 3–12)
Neutro Abs: 3.5 10*3/uL (ref 1.7–7.7)
Neutrophils Relative %: 41 % — ABNORMAL LOW (ref 43–77)
PLATELETS: 301 10*3/uL (ref 150–400)
RBC: 5.25 MIL/uL — ABNORMAL HIGH (ref 3.87–5.11)
RDW: 12.9 % (ref 11.5–15.5)
WBC: 8.6 10*3/uL (ref 4.0–10.5)

## 2014-05-17 LAB — BASIC METABOLIC PANEL
ANION GAP: 14 (ref 5–15)
BUN: 13 mg/dL (ref 6–23)
CALCIUM: 9.6 mg/dL (ref 8.4–10.5)
CO2: 25 mEq/L (ref 19–32)
Chloride: 99 mEq/L (ref 96–112)
Creatinine, Ser: 0.59 mg/dL (ref 0.50–1.10)
GFR calc Af Amer: >90 mL/min (ref >90)
Glucose, Bld: 231 mg/dL — ABNORMAL HIGH (ref 70–99)
Potassium: 4 mEq/L (ref 3.7–5.3)
SODIUM: 138 meq/L (ref 137–147)

## 2014-05-17 LAB — I-STAT TROPONIN, ED: Troponin i, poc: 0.02 ng/mL (ref 0.00–0.08)

## 2014-05-17 MED ORDER — PREDNISONE 20 MG PO TABS: 40.0000 mg | ORAL_TABLET | Freq: Every day | ORAL | Status: DC

## 2014-05-17 MED ORDER — ALBUTEROL SULFATE (2.5 MG/3ML) 0.083% IN NEBU
5.0000 mg | INHALATION_SOLUTION | Freq: Once | RESPIRATORY_TRACT | Status: AC
  Administered 2014-05-17: 5 mg via RESPIRATORY_TRACT
  Filled 2014-05-17: qty 6

## 2014-05-17 MED ORDER — ALBUTEROL SULFATE HFA 108 (90 BASE) MCG/ACT IN AERS: 1.0000 | INHALATION_SPRAY | Freq: Four times a day (QID) | RESPIRATORY_TRACT | Status: DC | PRN

## 2014-05-17 MED ORDER — IPRATROPIUM BROMIDE 0.02 % IN SOLN
0.5000 mg | Freq: Once | RESPIRATORY_TRACT | Status: AC
  Administered 2014-05-17: 0.5 mg via RESPIRATORY_TRACT
  Filled 2014-05-17: qty 2.5

## 2014-05-17 MED ORDER — POLYETHYLENE GLYCOL 3350 17 GM/SCOOP PO POWD: 1.0000 | Freq: Two times a day (BID) | ORAL | Status: DC

## 2014-05-17 NOTE — Discharge Instructions (Signed)
Take prednisone as directed until gone. Use albuterol inhaler for shortness of breath. Take miralax as needed for constipation. Refer to attached documents for more information. Follow up with your doctor. Return to the ED with worsening or concerning symptoms.

## 2014-05-17 NOTE — ED Notes (Signed)
Patient 02 Sats stayed 99-100% during walk around department.

## 2014-05-17 NOTE — ED Provider Notes (Signed)
CSN: [id number]     Arrival date & time 05/16/14  2217 History   First MD Initiated Contact with Patient 05/17/14 0019     Chief Complaint  Patient presents with  . Shortness of Breath     (Consider location/radiation/quality/duration/timing/severity/associated sxs/prior Treatment) HPI Comments: Patient is a 48 year old female with a past medical history of systolic heart failure, hypertension, asthma, and GERD who presents with a 3 week history of shortness of breath. Symptoms started gradually and progressively worsened since the onset. The SOB is constant and without aggravating/alleviating factors. Patient reports being treated with a sinus infection 3 weeks ago which provided no relief of her symptoms. Patient denies chest pain. She reports abdominal fullness and a sensation where her food that she eats is being "pushed up". No other associated symptoms.    Past Medical History  Diagnosis Date  . Systolic heart failure     May 2011 EF 35-40%, 04/03/11 EF 25-30%, 06/27/11 EF 30-35%  . Hypertension   . Asthma   . Obesity   . GERD (gastroesophageal reflux disease)   . Sleep apnea   . Heart murmur     dx 2 yrs ago per pt  . Seasonal allergies   . Shortness of breath     occasional - exercise induced  . Diabetes mellitus     recent dx 11/17/11 - started med 11/18/11  . Goiter 07/27/2011    non-neoplastic goiter - fine needle aspiration - benign  . Low back pain     history  . Anxiety     no meds  . Depression     no meds  . IBS (irritable bowel syndrome)     tx with diet per pt  . Herpes genitalis in women    Past Surgical History  Procedure Laterality Date  . Tubal ligation    . Diagnostic laparoscopy      of pelvis  . Novasure ablation      10/2005  . Dilation and curettage of uterus  12/2006,  10/2005    hysteroscopy surgery x 2  . Svd      x 3  . Wisdom tooth extraction    . Colonscopy    . Cardiac catherization  2004    [place], [place] - Dr Lynnell Jude  . Laparoscopic  assisted vaginal hysterectomy  11/23/2011    Procedure: LAPAROSCOPIC ASSISTED VAGINAL HYSTERECTOMY;  Surgeon: Jolayne Haines, MD;  Location: [facility];  Service: Gynecology;  Laterality: N/A;  . Cystoscopy  11/23/2011    Procedure: CYSTOSCOPY;  Surgeon: Jolayne Haines, MD;  Location: [facility];  Service: Gynecology;  Laterality: N/A;  . Salpingoophorectomy  11/23/2011    Procedure: SALPINGO OOPHERECTOMY;  Surgeon: Jolayne Haines, MD;  Location: [facility];  Service: Gynecology;  Laterality: Bilateral;  . Abdominal hysterectomy     Family History  Problem Relation Age of Onset  . Heart disease Maternal Aunt   . Breast cancer Mother   . Hypertension Maternal Grandmother   . Breast cancer Maternal Aunt    History  Substance Use Topics  . Smoking status: Never Smoker   . Smokeless tobacco: Never Used  . Alcohol Use: No   OB History   Grav Para Term Preterm Abortions TAB SAB Ect Mult Living   7 3   3  1   3      Review of Systems  Constitutional: Negative for fever, chills and fatigue.  HENT: Negative for trouble swallowing.   Eyes: Negative  for visual disturbance.  Respiratory: Positive for shortness of breath.   Cardiovascular: Negative for chest pain and palpitations.  Gastrointestinal: Negative for nausea, vomiting, abdominal pain and diarrhea.  Genitourinary: Negative for dysuria and difficulty urinating.  Musculoskeletal: Negative for arthralgias and neck pain.  Skin: Negative for color change.  Neurological: Negative for dizziness and weakness.  Psychiatric/Behavioral: Negative for dysphoric mood.      Allergies  Review of patient's allergies indicates no known allergies.  Home Medications   Prior to Admission medications   Medication Sig Start Date End Date Taking? Authorizing Provider  albuterol (VENTOLIN HFA) 108 (90 BASE) MCG/ACT inhaler Inhale 2 puffs into the lungs every 4 (four) hours as needed for wheezing. Takes for shortness of breath   Yes Historical Provider, MD   aspirin 81 MG chewable tablet Chew 81 mg by mouth daily.   Yes Historical Provider, MD  carvedilol (COREG) 25 MG tablet Take 25 mg by mouth 2 (two) times daily with a meal.   Yes Historical Provider, MD  fluticasone (FLONASE) 50 MCG/ACT nasal spray Place 2 sprays into both nostrils daily. 04/28/14  Yes Robyn M Hess, PA-C  furosemide (LASIX) 40 MG tablet Take 40 mg by mouth.   Yes Historical Provider, MD  glyBURIDE (DIABETA) 2.5 MG tablet Take 2.5 mg by mouth daily with breakfast.   Yes Historical Provider, MD  lisinopril (PRINIVIL,ZESTRIL) 20 MG tablet Take 10-20 mg by mouth 2 (two) times daily.   Yes Historical Provider, MD  Polyethyl Glycol-Propyl Glycol (SYSTANE) 0.4-0.3 % SOLN Apply 1 drop to eye 2 (two) times daily as needed (dry eyes).   Yes Historical Provider, MD  spironolactone (ALDACTONE) 25 MG tablet Take 1 tablet (25 mg total) by mouth daily. 04/16/13   Shaune Pascal Bensimhon, MD   BP 152/108  Pulse 80  Temp(Src) 98.1 F (36.7 C) (Oral)  Resp 12  SpO2 98% Physical Exam  Nursing note and vitals reviewed. Constitutional: She is oriented to person, place, and time. She appears well-developed and well-nourished. No distress.  HENT:  Head: Normocephalic and atraumatic.  Eyes: Conjunctivae and EOM are normal.  Neck: Normal range of motion.  Cardiovascular: Normal rate and regular rhythm.  Exam reveals no gallop and no friction rub.   No murmur heard. Pulmonary/Chest: Effort normal and breath sounds normal. She has no wheezes. She has no rales. She exhibits no tenderness.  Abdominal: Soft. She exhibits no distension. There is no tenderness. There is no rebound and no guarding.  Musculoskeletal: Normal range of motion.  No calf tenderness to palpation or leg swelling.   Neurological: She is alert and oriented to person, place, and time. Coordination normal.  Speech is goal-oriented. Moves limbs without ataxia.   Skin: Skin is warm and dry.  Psychiatric: She has a normal mood and  affect. Her behavior is normal.    ED Course  Procedures (including critical care time) Labs Review Labs Reviewed  CBC WITH DIFFERENTIAL - Abnormal; Notable for the following:    RBC 5.25 (*)    Neutrophils Relative % 41 (*)    Lymphocytes Relative 53 (*)    Lymphs Abs 4.6 (*)    All other components within normal limits  BASIC METABOLIC PANEL - Abnormal; Notable for the following:    Glucose, Bld 231 (*)    All other components within normal limits  I-STAT TROPOININ, ED    Imaging Review Dg Chest 2 View  05/16/2014   CLINICAL DATA:  Shortness of breath for approximately 3  weeks. History of hypertension, asthma, diabetes.  EXAM: CHEST  2 VIEW  COMPARISON:  08/21/2011  FINDINGS: Heart is mildly enlarged. Mediastinal contours are within normal limits. Mild peribronchial thickening. No confluent opacities or effusions. No acute bony abnormality.  IMPRESSION: Mild cardiomegaly, bronchitic changes.   Electronically Signed   By: Rolm Baptise M.D.   On: 05/16/2014 23:31     EKG Interpretation None      MDM   Final diagnoses:  Asthma exacerbation    3:26 AM Patient's labs, chest xray, and troponin unremarkable for acute changes. Patient's vitals unremarkable despite patient feeling short of breath. Patient has a history of asthma and possibly experiencing asthma exacerbation. Patient has negative troponin after days of continuous shortness of breath. She denies chest pain. Patient is PERC negative. Patient will be discharged with miralax for possible constipation, albuterol inhaler and prednisone for asthma. Patient instructed to return with worsening or concerning symptoms.    Alvina Chou, PA-C 05/18/14 0120

## 2014-05-19 NOTE — ED Provider Notes (Signed)
Medical screening examination/treatment/procedure(s) were performed by non-physician practitioner and as supervising physician I was immediately available for consultation/collaboration.   EKG Interpretation   Date/Time:  Sunday May 17 2014 00:44:52 EDT Ventricular Rate:  86 PR Interval:  171 QRS Duration: 157 QT Interval:  442 QTC Calculation: 529 R Axis:   9 Text Interpretation:  Sinus rhythm Probable left atrial enlargement Left  bundle branch block ED PHYSICIAN INTERPRETATION AVAILABLE IN [facility] No significant change since last tracing Reconfirmed by Glynn Octave [phone]) on 05/19/2014 10:10:27 AM        Everlene Balls, MD 05/19/14 1010

## 2014-05-25 ENCOUNTER — Encounter (HOSPITAL_COMMUNITY): Payer: Self-pay | Admitting: Emergency Medicine

## 2014-06-17 ENCOUNTER — Telehealth: Payer: Self-pay

## 2014-06-17 NOTE — Telephone Encounter (Signed)
Patient called today stating that for several months she is having trouble breathing. She has been to the ER twice and they have said URI and asthma.  She tells me that feels like there is something in abd pressing upward on her windpipe causing breathing problems.  She said all she can think of is that Dr. Moshe Salisbury once told her she has a cyst on her left side. She wants to come in and be checked.  I told her that Dr. Moshe Salisbury may can start the assessment but may need to refer her to someone else depending on what is going on.  Transferred her to appt desk to schedule.

## 2014-06-21 ENCOUNTER — Emergency Department (HOSPITAL_COMMUNITY)
Admission: EM | Admit: 2014-06-21 | Discharge: 2014-06-21 | Disposition: A | Discharge status: Home / Self Care | Payer: 59 | Attending: Emergency Medicine | Admitting: Emergency Medicine

## 2014-06-21 ENCOUNTER — Encounter (HOSPITAL_COMMUNITY): Payer: Self-pay | Admitting: *Deleted

## 2014-06-21 ENCOUNTER — Emergency Department (HOSPITAL_COMMUNITY): Payer: 59

## 2014-06-21 DIAGNOSIS — Z7982 Long term (current) use of aspirin: Secondary | ICD-10-CM | POA: Insufficient documentation

## 2014-06-21 DIAGNOSIS — K219 Gastro-esophageal reflux disease without esophagitis: Secondary | ICD-10-CM | POA: Insufficient documentation

## 2014-06-21 DIAGNOSIS — Z7951 Long term (current) use of inhaled steroids: Secondary | ICD-10-CM | POA: Insufficient documentation

## 2014-06-21 DIAGNOSIS — Z8669 Personal history of other diseases of the nervous system and sense organs: Secondary | ICD-10-CM | POA: Insufficient documentation

## 2014-06-21 DIAGNOSIS — Z8619 Personal history of other infectious and parasitic diseases: Secondary | ICD-10-CM | POA: Insufficient documentation

## 2014-06-21 DIAGNOSIS — R0602 Shortness of breath: Secondary | ICD-10-CM

## 2014-06-21 DIAGNOSIS — Z79899 Other long term (current) drug therapy: Secondary | ICD-10-CM | POA: Insufficient documentation

## 2014-06-21 DIAGNOSIS — I10 Essential (primary) hypertension: Secondary | ICD-10-CM | POA: Insufficient documentation

## 2014-06-21 DIAGNOSIS — Z7952 Long term (current) use of systemic steroids: Secondary | ICD-10-CM | POA: Insufficient documentation

## 2014-06-21 DIAGNOSIS — Z9889 Other specified postprocedural states: Secondary | ICD-10-CM | POA: Insufficient documentation

## 2014-06-21 DIAGNOSIS — J45901 Unspecified asthma with (acute) exacerbation: Secondary | ICD-10-CM | POA: Insufficient documentation

## 2014-06-21 DIAGNOSIS — Z8659 Personal history of other mental and behavioral disorders: Secondary | ICD-10-CM | POA: Insufficient documentation

## 2014-06-21 DIAGNOSIS — K5900 Constipation, unspecified: Secondary | ICD-10-CM

## 2014-06-21 DIAGNOSIS — E119 Type 2 diabetes mellitus without complications: Secondary | ICD-10-CM | POA: Insufficient documentation

## 2014-06-21 DIAGNOSIS — I5020 Unspecified systolic (congestive) heart failure: Secondary | ICD-10-CM | POA: Insufficient documentation

## 2014-06-21 DIAGNOSIS — R011 Cardiac murmur, unspecified: Secondary | ICD-10-CM | POA: Insufficient documentation

## 2014-06-21 DIAGNOSIS — E669 Obesity, unspecified: Secondary | ICD-10-CM | POA: Insufficient documentation

## 2014-06-21 LAB — URINALYSIS, ROUTINE W REFLEX MICROSCOPIC
Bilirubin Urine: NEGATIVE
GLUCOSE, UA: 250 mg/dL — AB
Hgb urine dipstick: NEGATIVE
Ketones, ur: NEGATIVE mg/dL
Leukocytes, UA: NEGATIVE
Nitrite: NEGATIVE
Protein, ur: NEGATIVE mg/dL
SPECIFIC GRAVITY, URINE: 1.026 (ref 1.005–1.030)
Urobilinogen, UA: 0.2 mg/dL (ref 0.0–1.0)
pH: 5 (ref 5.0–8.0)

## 2014-06-21 LAB — COMPREHENSIVE METABOLIC PANEL
ALBUMIN: 3.5 g/dL (ref 3.5–5.2)
ALK PHOS: 107 U/L (ref 39–117)
ALT: 25 U/L (ref 0–35)
AST: 21 U/L (ref 0–37)
Anion gap: 14 (ref 5–15)
BUN: 14 mg/dL (ref 6–23)
CO2: 23 mEq/L (ref 19–32)
Calcium: 9.3 mg/dL (ref 8.4–10.5)
Chloride: 100 mEq/L (ref 96–112)
Creatinine, Ser: 0.51 mg/dL (ref 0.50–1.10)
GFR calc Af Amer: >90 mL/min (ref >90)
GFR calc non Af Amer: >90 mL/min (ref >90)
GLUCOSE: 252 mg/dL — AB (ref 70–99)
POTASSIUM: 4.1 meq/L (ref 3.7–5.3)
SODIUM: 137 meq/L (ref 137–147)
TOTAL PROTEIN: 7.2 g/dL (ref 6.0–8.3)
Total Bilirubin: 0.6 mg/dL (ref 0.3–1.2)

## 2014-06-21 LAB — CBC WITH DIFFERENTIAL/PLATELET
BASOS PCT: 0 % (ref 0–1)
Basophils Absolute: 0 10*3/uL (ref 0.0–0.1)
EOS ABS: 0.1 10*3/uL (ref 0.0–0.7)
Eosinophils Relative: 1 % (ref 0–5)
HCT: 39.9 % (ref 36.0–46.0)
Hemoglobin: 14.1 g/dL (ref 12.0–15.0)
LYMPHS ABS: 3.5 10*3/uL (ref 0.7–4.0)
Lymphocytes Relative: 51 % — ABNORMAL HIGH (ref 12–46)
MCH: 27.6 pg (ref 26.0–34.0)
MCHC: 35.3 g/dL (ref 30.0–36.0)
MCV: 78.1 fL (ref 78.0–100.0)
Monocytes Absolute: 0.4 10*3/uL (ref 0.1–1.0)
Monocytes Relative: 6 % (ref 3–12)
Neutro Abs: 2.9 10*3/uL (ref 1.7–7.7)
Neutrophils Relative %: 42 % — ABNORMAL LOW (ref 43–77)
PLATELETS: 270 10*3/uL (ref 150–400)
RBC: 5.11 MIL/uL (ref 3.87–5.11)
RDW: 12.8 % (ref 11.5–15.5)
WBC: 6.9 10*3/uL (ref 4.0–10.5)

## 2014-06-21 LAB — PRO B NATRIURETIC PEPTIDE: PRO B NATRI PEPTIDE: 744.9 pg/mL — AB (ref 0–125)

## 2014-06-21 LAB — TROPONIN I

## 2014-06-21 LAB — LIPASE, BLOOD: Lipase: 38 U/L (ref 11–59)

## 2014-06-21 MED ORDER — FUROSEMIDE 10 MG/ML IJ SOLN
80.0000 mg | Freq: Once | INTRAMUSCULAR | Status: AC
  Administered 2014-06-21: 80 mg via INTRAVENOUS
  Filled 2014-06-21: qty 8

## 2014-06-21 MED ORDER — LACTULOSE 10 GM/15ML PO SOLN: 10.0000 g | Freq: Every day | ORAL | Status: DC | PRN

## 2014-06-21 NOTE — ED Notes (Signed)
Patient transported to X-ray 

## 2014-06-21 NOTE — ED Notes (Signed)
Pt reports having sob x 2 months, has been seen at [facility] for same. Denies recent cough but is having difficulty sleeping.

## 2014-06-21 NOTE — ED Provider Notes (Addendum)
CSN: [id number]     Arrival date & time 06/21/14  0037 History   First MD Initiated Contact with Patient 06/21/14 0935     Chief Complaint  Patient presents with  . Shortness of Breath     (Consider location/radiation/quality/duration/timing/severity/associated sxs/prior Treatment) HPI Comments: Presents to the ER for evaluation of progressively worsening shortness of breath. Patient reports that symptoms have been ongoing for 2 months. She has been seen previously at [facility] ER for this and told she had upper respiratory infection. Patient reports that her symptoms worsen at night. She has trouble sleeping because she is short of breath and cannot get comfortable. She has not noticed any significant worsening with exertion. There has not been any chest pain. She does endorse cough, nonproductive. No fever.  Patient is a 48 y.o. female presenting with shortness of breath.  Shortness of Breath   Past Medical History  Diagnosis Date  . Systolic heart failure     May 2011 EF 35-40%, 04/03/11 EF 25-30%, 06/27/11 EF 30-35%  . Hypertension   . Asthma   . Obesity   . GERD (gastroesophageal reflux disease)   . Sleep apnea   . Heart murmur     dx 2 yrs ago per pt  . Seasonal allergies   . Shortness of breath     occasional - exercise induced  . Diabetes mellitus     recent dx 11/17/11 - started med 11/18/11  . Goiter 07/27/2011    non-neoplastic goiter - fine needle aspiration - benign  . Low back pain     history  . Anxiety     no meds  . Depression     no meds  . IBS (irritable bowel syndrome)     tx with diet per pt  . Herpes genitalis in women    Past Surgical History  Procedure Laterality Date  . Tubal ligation    . Diagnostic laparoscopy      of pelvis  . Novasure ablation      10/2005  . Dilation and curettage of uterus  12/2006,  10/2005    hysteroscopy surgery x 2  . Svd      x 3  . Wisdom tooth extraction    . Colonscopy    . Cardiac  catherization  2004    [place], [place] - Dr Lynnell Jude  . Laparoscopic assisted vaginal hysterectomy  11/23/2011    Procedure: LAPAROSCOPIC ASSISTED VAGINAL HYSTERECTOMY;  Surgeon: Jolayne Haines, MD;  Location: [facility];  Service: Gynecology;  Laterality: N/A;  . Cystoscopy  11/23/2011    Procedure: CYSTOSCOPY;  Surgeon: Jolayne Haines, MD;  Location: [facility];  Service: Gynecology;  Laterality: N/A;  . Salpingoophorectomy  11/23/2011    Procedure: SALPINGO OOPHERECTOMY;  Surgeon: Jolayne Haines, MD;  Location: [facility];  Service: Gynecology;  Laterality: Bilateral;  . Abdominal hysterectomy     Family History  Problem Relation Age of Onset  . Heart disease Maternal Aunt   . Breast cancer Mother   . Hypertension Maternal Grandmother   . Breast cancer Maternal Aunt    History  Substance Use Topics  . Smoking status: Never Smoker   . Smokeless tobacco: Never Used  . Alcohol Use: No   OB History    Gravida Para Term Preterm AB TAB SAB Ectopic Multiple Living   7 3   3  1   3      Review of Systems  Respiratory: Positive for shortness of  breath.   All other systems reviewed and are negative.     Allergies  Review of patient's allergies indicates no known allergies.  Home Medications   Prior to Admission medications   Medication Sig Start Date End Date Taking? Authorizing Provider  aspirin 81 MG chewable tablet Chew 81 mg by mouth daily.   Yes Historical Provider, MD  bisacodyl (BISACODYL) 5 MG EC tablet Take 5 mg by mouth 2 (two) times daily as needed for moderate constipation.   Yes Historical Provider, MD  famotidine (PEPCID) 20 MG tablet Take 20 mg by mouth daily as needed for heartburn or indigestion.   Yes Historical Provider, MD  fluticasone (FLONASE) 50 MCG/ACT nasal spray Place 2 sprays into both nostrils daily. 04/28/14  Yes Robyn M Hess, PA-C  furosemide (LASIX) 40 MG tablet Take 40 mg by mouth.   Yes Historical Provider, MD  lisinopril (PRINIVIL,ZESTRIL) 20 MG tablet Take 10-20  mg by mouth 2 (two) times daily.   Yes Historical Provider, MD  Polyethyl Glycol-Propyl Glycol (SYSTANE) 0.4-0.3 % SOLN Apply 1 drop to eye 2 (two) times daily as needed (dry eyes).   Yes Historical Provider, MD  polyethylene glycol powder (GLYCOLAX/MIRALAX) powder Take 255 g by mouth 2 (two) times daily. Until daily soft stools  OTC 05/17/14  Yes Kaitlyn Szekalski, PA-C  sodium chloride (OCEAN) 0.65 % SOLN nasal spray Place 1 spray into both nostrils as needed for congestion.   Yes Historical Provider, MD  albuterol (PROVENTIL HFA;VENTOLIN HFA) 108 (90 BASE) MCG/ACT inhaler Inhale 1-2 puffs into the lungs every 6 (six) hours as needed for wheezing or shortness of breath. Patient not taking: Reported on 06/21/2014 05/17/14   Alvina Chou, PA-C  carvedilol (COREG) 25 MG tablet Take 25 mg by mouth 2 (two) times daily with a meal.    Historical Provider, MD  glyBURIDE (DIABETA) 2.5 MG tablet Take 2.5 mg by mouth daily with breakfast.    Historical Provider, MD  predniSONE (DELTASONE) 20 MG tablet Take 2 tablets (40 mg total) by mouth daily. Patient not taking: Reported on 06/21/2014 05/17/14   Alvina Chou, PA-C  spironolactone (ALDACTONE) 25 MG tablet Take 1 tablet (25 mg total) by mouth daily. Patient not taking: Reported on 06/21/2014 04/16/13   Shaune Pascal Bensimhon, MD   BP 103/68 mmHg  Pulse 76  Temp(Src) 98.8 F (37.1 C) (Oral)  Resp 16  SpO2 95% Physical Exam  Constitutional: She is oriented to person, place, and time. She appears well-developed and well-nourished. No distress.  HENT:  Head: Normocephalic and atraumatic.  Right Ear: Hearing normal.  Left Ear: Hearing normal.  Nose: Nose normal.  Mouth/Throat: Oropharynx is clear and moist and mucous membranes are normal.  Eyes: Conjunctivae and EOM are normal. Pupils are equal, round, and reactive to light.  Neck: Normal range of motion. Neck supple.  Cardiovascular: Regular rhythm, S1 normal and S2 normal.  Exam reveals no  gallop and no friction rub.   No murmur heard. Pulmonary/Chest: Effort normal and breath sounds normal. No respiratory distress. She exhibits no tenderness.  Abdominal: Soft. Normal appearance and bowel sounds are normal. There is no hepatosplenomegaly. There is no tenderness. There is no rebound, no guarding, no tenderness at McBurney's point and negative Murphy's sign. No hernia.  Musculoskeletal: Normal range of motion.  Neurological: She is alert and oriented to person, place, and time. She has normal strength. No cranial nerve deficit or sensory deficit. Coordination normal. GCS eye subscore is 4. GCS verbal subscore is  5. GCS motor subscore is 6.  Skin: Skin is warm, dry and intact. No rash noted. No cyanosis.  Psychiatric: She has a normal mood and affect. Her speech is normal and behavior is normal. Thought content normal.  Nursing note and vitals reviewed.   ED Course  Procedures (including critical care time) Labs Review Labs Reviewed  CBC WITH DIFFERENTIAL - Abnormal; Notable for the following:    Neutrophils Relative % 42 (*)    Lymphocytes Relative 51 (*)    All other components within normal limits  COMPREHENSIVE METABOLIC PANEL - Abnormal; Notable for the following:    Glucose, Bld 252 (*)    All other components within normal limits  URINALYSIS, ROUTINE W REFLEX MICROSCOPIC - Abnormal; Notable for the following:    Glucose, UA 250 (*)    All other components within normal limits  PRO B NATRIURETIC PEPTIDE - Abnormal; Notable for the following:    Pro B Natriuretic peptide (BNP) 744.9 (*)    All other components within normal limits  LIPASE, BLOOD  TROPONIN I    Imaging Review Dg Chest 2 View  06/21/2014   CLINICAL DATA:  Shortness of breath x2 months  EXAM: CHEST  2 VIEW  COMPARISON:  05/16/2014  FINDINGS: Chronic interstitial markings. No focal consolidation. No pleural effusion or pneumothorax.  Mild cardiomegaly.  Degenerative changes of the visualized  thoracolumbar spine.  IMPRESSION: No evidence of acute cardiopulmonary disease.   Electronically Signed   By: Julian Hy M.D.   On: 06/21/2014 11:18     EKG Interpretation None      MDM   Final diagnoses:  Shortness of breath   constipation  Patient presents to the ER for evaluation of progressively worsening shortness of breath. Patient reports that this has been ongoing for a couple of months. Her examination was largely unremarkable. She has a very slightly elevated BNP. Remainder of cardiac workup and other laboratory values were unremarkable. She is not experiencing any chest pain. Patient administered IV Lasix with diuresis here in the ER and has improved. Symptoms are likely multifactorial. I do not feel she requires hospitalization. Will increase Lasix for 3 days and have her follow-up with her doctor. She is also complaining of constipation. She has issues with constipation as well as acid reflux. This is likely causing some of the epigastric discomfort she has had previously. She is taking MiraLAX without result. Will prescribe lactulose. Return if her symptoms worsen.    Orpah Greek, MD 06/21/14 [place], MD 07/13/14 [phone]

## 2014-06-21 NOTE — Discharge Instructions (Signed)
Take double the dose of Lasix for the next 3 days. Follow-up with your doctor in the office this week. Return to the ER if symptoms worsen.  Cough, Adult  A cough is a reflex that helps clear your throat and airways. It can help heal the body or may be a reaction to an irritated airway. A cough may only last 2 or 3 weeks (acute) or may last more than 8 weeks (chronic).  CAUSES Acute cough:  Viral or bacterial infections. Chronic cough:  Infections.  Allergies.  Asthma.  Post-nasal drip.  Smoking.  Heartburn or acid reflux.  Some medicines.  Chronic lung problems (COPD).  Cancer. SYMPTOMS   Cough.  Fever.  Chest pain.  Increased breathing rate.  High-pitched whistling sound when breathing (wheezing).  Colored mucus that you cough up (sputum). TREATMENT   A bacterial cough may be treated with antibiotic medicine.  A viral cough must run its course and will not respond to antibiotics.  Your caregiver may recommend other treatments if you have a chronic cough. HOME CARE INSTRUCTIONS   Only take over-the-counter or prescription medicines for pain, discomfort, or fever as directed by your caregiver. Use cough suppressants only as directed by your caregiver.  Use a cold steam vaporizer or humidifier in your bedroom or home to help loosen secretions.  Sleep in a semi-upright position if your cough is worse at night.  Rest as needed.  Stop smoking if you smoke. SEEK IMMEDIATE MEDICAL CARE IF:   You have pus in your sputum.  Your cough starts to worsen.  You cannot control your cough with suppressants and are losing sleep.  You begin coughing up blood.  You have difficulty breathing.  You develop pain which is getting worse or is uncontrolled with medicine.  You have a fever. MAKE SURE YOU:   Understand these instructions.  Will watch your condition.  Will get help right away if you are not doing well or get worse. Document Released: 01/06/2011  Document Revised: 10/02/2011 Document Reviewed: 01/06/2011 [facility] Patient Information 2015 [place], [place]. This information is not intended to replace advice given to you by your health care provider. Make sure you discuss any questions you have with your health care provider.  Shortness of Breath Shortness of breath means you have trouble breathing. It could also mean that you have a medical problem. You should get immediate medical care for shortness of breath. CAUSES   Not enough oxygen in the air such as with high altitudes or a smoke-filled room.  Certain lung diseases, infections, or problems.  Heart disease or conditions, such as angina or heart failure.  Low red blood cells (anemia).  Poor physical fitness, which can cause shortness of breath when you exercise.  Chest or back injuries or stiffness.  Being overweight.  Smoking.  Anxiety, which can make you feel like you are not getting enough air. DIAGNOSIS  Serious medical problems can often be found during your physical exam. Tests may also be done to determine why you are having shortness of breath. Tests may include:  Chest X-rays.  Lung function tests.  Blood tests.  An electrocardiogram (ECG).  An ambulatory electrocardiogram. An ambulatory ECG records your heartbeat patterns over a 24-hour period.  Exercise testing.  A transthoracic echocardiogram (TTE). During echocardiography, sound waves are used to evaluate how blood flows through your heart.  A transesophageal echocardiogram (TEE).  Imaging scans. Your health care provider may not be able to find a cause for your shortness  of breath after your exam. In this case, it is important to have a follow-up exam with your health care provider as directed.  TREATMENT  Treatment for shortness of breath depends on the cause of your symptoms and can vary greatly. HOME CARE INSTRUCTIONS   Do not smoke. Smoking is a common cause of shortness of breath. If you  smoke, ask for help to quit.  Avoid being around chemicals or things that may bother your breathing, such as paint fumes and dust.  Rest as needed. Slowly resume your usual activities.  If medicines were prescribed, take them as directed for the full length of time directed. This includes oxygen and any inhaled medicines.  Keep all follow-up appointments as directed by your health care provider. SEEK MEDICAL CARE IF:   Your condition does not improve in the time expected.  You have a hard time doing your normal activities even with rest.  You have any new symptoms. SEEK IMMEDIATE MEDICAL CARE IF:   Your shortness of breath gets worse.  You feel light-headed, faint, or develop a cough not controlled with medicines.  You start coughing up blood.  You have pain with breathing.  You have chest pain or pain in your arms, shoulders, or abdomen.  You have a fever.  You are unable to walk up stairs or exercise the way you normally do. MAKE SURE YOU:  Understand these instructions.  Will watch your condition.  Will get help right away if you are not doing well or get worse. Document Released: 04/04/2001 Document Revised: 07/15/2013 Document Reviewed: 09/25/2011 [facility] Patient Information 2015 [place], [place]. This information is not intended to replace advice given to you by your health care provider. Make sure you discuss any questions you have with your health care provider.

## 2014-06-23 ENCOUNTER — Ambulatory Visit: Payer: Self-pay | Admitting: Women's Health

## 2014-08-05 ENCOUNTER — Ambulatory Visit (INDEPENDENT_AMBULATORY_CARE_PROVIDER_SITE_OTHER): Payer: 59 | Admitting: Internal Medicine

## 2014-08-05 ENCOUNTER — Encounter: Payer: Self-pay | Admitting: Internal Medicine

## 2014-08-05 VITALS — BP 138/82 | HR 92 | Ht 64.0 in | Wt 192.0 lb

## 2014-08-05 DIAGNOSIS — R0600 Dyspnea, unspecified: Secondary | ICD-10-CM | POA: Insufficient documentation

## 2014-08-05 DIAGNOSIS — R070 Pain in throat: Secondary | ICD-10-CM

## 2014-08-05 NOTE — Addendum Note (Signed)
Addended by: Maurice March on: 08/05/2014 03:33 PM   Modules accepted: Orders

## 2014-08-05 NOTE — Patient Instructions (Signed)
ICD-9-CM ICD-10-CM   1. Throat discomfort 784.1 R07.0   2. Dyspnea 786.09 R06.00 Spirometry with Graph    - Do High Resolution CT chest without contrast on ILD protocol asap  - Do full PFT asap  - REfer ENT   Followup  -NExt 2 weeks to see Susan Peck my NPT after completion ofPFT and CT chest

## 2014-08-05 NOTE — Progress Notes (Signed)
Subjective:    Patient ID: Susan Peck, female    DOB: 1966-05-10, 49 y.o.   MRN: [id number]  HPI    OV 08/05/2014  Chief Complaint  Patient presents with  . Pulmonary Consult    Pt here for self referral for SOB. Pt stated she has SOB with and without activity. Pt stated her SOB increase at night. Pt using proair every 6 hours without relief.    49 year old female has a long-standing history of asthma for which she is only on albuterol as needed. She also has history of chronic systolic heart failure but per chart review 2049 has normal ejection fraction. She has sleep apnea for which she has not followed up with Dr. Gwenette Greet. She reports insidious onset of dyspnea starting October 2015 and is been progressive since then. In early October 2015 she had an emergency department visit within our health system. Review of the records and according to her history it sounded like and mild asthma flareup and she was discharged with albuterol when necessary and Flonase. Then in the middle of November 2015 she had another ER visit during which time lab work was normal and chest x-ray was normal but BNP was elevated 700s and she was given IV Lasix and discharged with advice to increase his scheduled Lasix. However she reports continued worsening of dyspnea. Initially only with exertion and relieved by rest. But now sometimes even at rest she has a subjective sensation of having to take a deep breath. There is no associated chest pain or cough or wheezing or pedal edema.  Spirometry today in the office: shows moderate restriction  Also she is complaining of blocking sensation of ear and throat lump feeling - wants ENT Referral        has a past medical history of Systolic heart failure; Hypertension; Asthma; Obesity; GERD (gastroesophageal reflux disease); Sleep apnea; Heart murmur; Seasonal allergies; Shortness of breath; Diabetes mellitus; Goiter (07/27/2011); Low back pain; Anxiety; Depression;  IBS (irritable bowel syndrome); and Herpes genitalis in women.   reports that she has never smoked. She has never used smokeless tobacco.  Past Surgical History  Procedure Laterality Date  . Tubal ligation    . Diagnostic laparoscopy      of pelvis  . Novasure ablation      10/2005  . Dilation and curettage of uterus  12/2006,  10/2005    hysteroscopy surgery x 2  . Svd      x 3  . Wisdom tooth extraction    . Colonscopy    . Cardiac catherization  2004    [place], [place] - Dr Lynnell Jude  . Laparoscopic assisted vaginal hysterectomy  11/23/2011    Procedure: LAPAROSCOPIC ASSISTED VAGINAL HYSTERECTOMY;  Surgeon: Jolayne Haines, MD;  Location: [facility];  Service: Gynecology;  Laterality: N/A;  . Cystoscopy  11/23/2011    Procedure: CYSTOSCOPY;  Surgeon: Jolayne Haines, MD;  Location: [facility];  Service: Gynecology;  Laterality: N/A;  . Salpingoophorectomy  11/23/2011    Procedure: SALPINGO OOPHERECTOMY;  Surgeon: Jolayne Haines, MD;  Location: [facility];  Service: Gynecology;  Laterality: Bilateral;  . Abdominal hysterectomy      No Known Allergies   There is no immunization history on file for this patient.  Family History  Problem Relation Age of Onset  . Heart disease Maternal Aunt   . Breast cancer Mother   . Hypertension Maternal Grandmother   . Breast cancer Maternal Aunt  Current outpatient prescriptions:  .  albuterol (PROVENTIL HFA;VENTOLIN HFA) 108 (90 BASE) MCG/ACT inhaler, Inhale 1-2 puffs into the lungs every 6 (six) hours as needed for wheezing or shortness of breath., Disp: 1 Inhaler, Rfl: 0 .  aspirin 81 MG chewable tablet, Chew 81 mg by mouth daily., Disp: , Rfl:  .  bisacodyl (BISACODYL) 5 MG EC tablet, Take 5 mg by mouth 2 (two) times daily as needed for moderate constipation., Disp: , Rfl:  .  carvedilol (COREG) 25 MG tablet, Take 25 mg by mouth 2 (two) times daily with a meal., Disp: , Rfl:  .  famotidine (PEPCID) 20 MG tablet, Take 20 mg by mouth daily as needed for  heartburn or indigestion., Disp: , Rfl:  .  lisinopril (PRINIVIL,ZESTRIL) 20 MG tablet, Take 10-20 mg by mouth 2 (two) times daily., Disp: , Rfl:  .  Polyethyl Glycol-Propyl Glycol (SYSTANE) 0.4-0.3 % SOLN, Apply 1 drop to eye 2 (two) times daily as needed (dry eyes)., Disp: , Rfl:  .  polyethylene glycol powder (GLYCOLAX/MIRALAX) powder, Take 255 g by mouth 2 (two) times daily. Until daily soft stools  OTC, Disp: 850 g, Rfl: 0 .  spironolactone (ALDACTONE) 25 MG tablet, Take 1 tablet (25 mg total) by mouth daily., Disp: 30 tablet, Rfl: 6 .  furosemide (LASIX) 40 MG tablet, Take 40 mg by mouth., Disp: , Rfl:  .  glyBURIDE (DIABETA) 2.5 MG tablet, Take 2.5 mg by mouth daily with breakfast., Disp: , Rfl:  .  [DISCONTINUED] eplerenone (INSPRA) 25 MG tablet, Take 1 tablet (25 mg total) by mouth daily., Disp: 30 tablet, Rfl: 6    Review of Systems  Constitutional: Negative for fever and unexpected weight change.  HENT: Positive for postnasal drip and trouble swallowing. Negative for congestion, dental problem, ear pain, nosebleeds, rhinorrhea, sinus pressure, sneezing and sore throat.   Eyes: Negative for redness and itching.  Respiratory: Positive for cough and shortness of breath. Negative for chest tightness and wheezing.   Cardiovascular: Positive for leg swelling. Negative for palpitations.  Gastrointestinal: Negative for nausea, vomiting and abdominal pain.  Genitourinary: Negative for dysuria.  Musculoskeletal: Negative for joint swelling.  Skin: Negative for rash.  Neurological: Negative for headaches.  Hematological: Does not bruise/bleed easily.  Psychiatric/Behavioral: Negative for dysphoric mood. The patient is not nervous/anxious.        Objective:   Physical Exam  Constitutional: She is oriented to person, place, and time. She appears well-developed and well-nourished. No distress.  Body mass index is 32.94 kg/(m^2).   HENT:  Head: Normocephalic and atraumatic.  Right Ear:  External ear normal.  Left Ear: External ear normal.  Mouth/Throat: Oropharynx is clear and moist. No oropharyngeal exudate.  Eyes: Conjunctivae and EOM are normal. Pupils are equal, round, and reactive to light. Right eye exhibits no discharge. Left eye exhibits no discharge. No scleral icterus.  Neck: Normal range of motion. Neck supple. No JVD present. No tracheal deviation present. No thyromegaly present.  Cardiovascular: Normal rate, regular rhythm, normal heart sounds and intact distal pulses.  Exam reveals no gallop and no friction rub.   No murmur heard. Pulmonary/Chest: Effort normal and breath sounds normal. No respiratory distress. She has no wheezes. She has no rales. She exhibits no tenderness.  Abdominal: Soft. Bowel sounds are normal. She exhibits no distension and no mass. There is no tenderness. There is no rebound and no guarding.  Musculoskeletal: Normal range of motion. She exhibits no edema or tenderness.  Lymphadenopathy:  She has no cervical adenopathy.  Neurological: She is alert and oriented to person, place, and time. She has normal reflexes. No cranial nerve deficit. She exhibits normal muscle tone. Coordination normal.  Skin: Skin is warm and dry. No rash noted. She is not diaphoretic. No erythema. No pallor.  Psychiatric: She has a normal mood and affect. Her behavior is normal. Judgment and thought content normal.  Vitals reviewed.   Filed Vitals:   08/05/14 1455  BP: 138/82  Pulse: 92  Height: 5\' 4"  (1.626 m)  Weight: 192 lb (87.091 kg)  SpO2: 96%         Assessment & Plan:  Throat discomfort  Dyspnea - Plan: Spirometry with Graph    - not sure is asthma, IF asthma then could also be ace inhibitor mediated. SHe has restricted spirometry. So need to rule out ILD.  She is wanting ENT referral  REC - Do High Resolution CT chest without contrast on ILD protocol asap  - Do full PFT asap  - REfer ENT at her request   Followup  -NExt 2 weeks to  see TammyPArrett my NPT after completion ofPFT and CT chest

## 2014-08-07 ENCOUNTER — Ambulatory Visit (HOSPITAL_COMMUNITY)
Admission: RE | Admit: 2014-08-07 | Discharge: 2014-08-07 | Disposition: A | Discharge status: Home / Self Care | Payer: 59 | Source: Ambulatory Visit | Attending: Internal Medicine | Admitting: Internal Medicine

## 2014-08-07 DIAGNOSIS — R0600 Dyspnea, unspecified: Secondary | ICD-10-CM | POA: Diagnosis present

## 2014-08-07 LAB — PULMONARY FUNCTION TEST
DL/VA % PRED: 150 %
DL/VA: 7.25 ml/min/mmHg/L
DLCO UNC % PRED: 78 %
DLCO unc: 19.04 ml/min/mmHg
FEF 25-75 Post: 4.09 L/sec
FEF 25-75 Pre: 3.08 L/sec
FEF2575-%CHANGE-POST: 33 %
FEF2575-%PRED-POST: 163 %
FEF2575-%Pred-Pre: 123 %
FEV1-%Change-Post: 3 %
FEV1-%PRED-PRE: 72 %
FEV1-%Pred-Post: 74 %
FEV1-POST: 1.76 L
FEV1-Pre: 1.69 L
FEV1FVC-%CHANGE-POST: 4 %
FEV1FVC-%Pred-Pre: 107 %
FEV6-%CHANGE-POST: 0 %
FEV6-%PRED-PRE: 67 %
FEV6-%Pred-Post: 67 %
FEV6-POST: 1.92 L
FEV6-Pre: 1.91 L
FEV6FVC-%Change-Post: 0 %
FEV6FVC-%PRED-PRE: 102 %
FEV6FVC-%Pred-Post: 103 %
FVC-%CHANGE-POST: 0 %
FVC-%Pred-Post: 65 %
FVC-%Pred-Pre: 65 %
FVC-POST: 1.92 L
FVC-PRE: 1.93 L
Post FEV1/FVC ratio: 92 %
Post FEV6/FVC ratio: 100 %
Pre FEV1/FVC ratio: 88 %
Pre FEV6/FVC Ratio: 99 %
RV % PRED: 78 %
RV: 1.38 L
TLC % pred: 66 %
TLC: 3.38 L

## 2014-08-07 MED ORDER — ALBUTEROL SULFATE (2.5 MG/3ML) 0.083% IN NEBU
2.5000 mg | INHALATION_SOLUTION | Freq: Once | RESPIRATORY_TRACT | Status: AC
  Administered 2014-08-07: 2.5 mg via RESPIRATORY_TRACT

## 2014-08-09 ENCOUNTER — Encounter (HOSPITAL_COMMUNITY): Payer: Self-pay | Admitting: *Deleted

## 2014-08-09 ENCOUNTER — Emergency Department (HOSPITAL_COMMUNITY): Payer: 59

## 2014-08-09 ENCOUNTER — Emergency Department (HOSPITAL_COMMUNITY)
Admission: EM | Admit: 2014-08-09 | Discharge: 2014-08-09 | Disposition: A | Discharge status: Home / Self Care | Payer: 59 | Attending: Emergency Medicine | Admitting: Emergency Medicine

## 2014-08-09 DIAGNOSIS — Z79899 Other long term (current) drug therapy: Secondary | ICD-10-CM | POA: Diagnosis not present

## 2014-08-09 DIAGNOSIS — R0602 Shortness of breath: Secondary | ICD-10-CM | POA: Diagnosis present

## 2014-08-09 DIAGNOSIS — E119 Type 2 diabetes mellitus without complications: Secondary | ICD-10-CM | POA: Insufficient documentation

## 2014-08-09 DIAGNOSIS — R51 Headache: Secondary | ICD-10-CM | POA: Diagnosis not present

## 2014-08-09 DIAGNOSIS — R110 Nausea: Secondary | ICD-10-CM | POA: Insufficient documentation

## 2014-08-09 DIAGNOSIS — Z7982 Long term (current) use of aspirin: Secondary | ICD-10-CM | POA: Diagnosis not present

## 2014-08-09 DIAGNOSIS — Z8659 Personal history of other mental and behavioral disorders: Secondary | ICD-10-CM | POA: Diagnosis not present

## 2014-08-09 DIAGNOSIS — K219 Gastro-esophageal reflux disease without esophagitis: Secondary | ICD-10-CM | POA: Insufficient documentation

## 2014-08-09 DIAGNOSIS — Z8619 Personal history of other infectious and parasitic diseases: Secondary | ICD-10-CM | POA: Diagnosis not present

## 2014-08-09 DIAGNOSIS — Z8669 Personal history of other diseases of the nervous system and sense organs: Secondary | ICD-10-CM | POA: Insufficient documentation

## 2014-08-09 DIAGNOSIS — E669 Obesity, unspecified: Secondary | ICD-10-CM | POA: Insufficient documentation

## 2014-08-09 DIAGNOSIS — I5020 Unspecified systolic (congestive) heart failure: Secondary | ICD-10-CM | POA: Diagnosis not present

## 2014-08-09 DIAGNOSIS — R011 Cardiac murmur, unspecified: Secondary | ICD-10-CM | POA: Insufficient documentation

## 2014-08-09 DIAGNOSIS — J45901 Unspecified asthma with (acute) exacerbation: Secondary | ICD-10-CM | POA: Diagnosis not present

## 2014-08-09 DIAGNOSIS — I10 Essential (primary) hypertension: Secondary | ICD-10-CM | POA: Insufficient documentation

## 2014-08-09 DIAGNOSIS — R0600 Dyspnea, unspecified: Secondary | ICD-10-CM

## 2014-08-09 LAB — BASIC METABOLIC PANEL
Anion gap: 6 (ref 5–15)
BUN: 12 mg/dL (ref 6–23)
CO2: 24 mmol/L (ref 19–32)
Calcium: 9 mg/dL (ref 8.4–10.5)
Chloride: 106 mEq/L (ref 96–112)
Creatinine, Ser: 0.57 mg/dL (ref 0.50–1.10)
GFR calc Af Amer: >90 mL/min (ref >90)
GFR calc non Af Amer: >90 mL/min (ref >90)
Glucose, Bld: 235 mg/dL — ABNORMAL HIGH (ref 70–99)
Potassium: 4.1 mmol/L (ref 3.5–5.1)
Sodium: 136 mmol/L (ref 135–145)

## 2014-08-09 LAB — CBC WITH DIFFERENTIAL/PLATELET
Basophils Absolute: 0 10*3/uL (ref 0.0–0.1)
Basophils Relative: 0 % (ref 0–1)
Eosinophils Absolute: 0.1 10*3/uL (ref 0.0–0.7)
Eosinophils Relative: 1 % (ref 0–5)
HCT: 36.5 % (ref 36.0–46.0)
Hemoglobin: 12.8 g/dL (ref 12.0–15.0)
Lymphocytes Relative: 46 % (ref 12–46)
Lymphs Abs: 3 10*3/uL (ref 0.7–4.0)
MCH: 27.4 pg (ref 26.0–34.0)
MCHC: 35.1 g/dL (ref 30.0–36.0)
MCV: 78.2 fL (ref 78.0–100.0)
Monocytes Absolute: 0.4 10*3/uL (ref 0.1–1.0)
Monocytes Relative: 5 % (ref 3–12)
Neutro Abs: 3.1 10*3/uL (ref 1.7–7.7)
Neutrophils Relative %: 48 % (ref 43–77)
Platelets: 239 10*3/uL (ref 150–400)
RBC: 4.67 MIL/uL (ref 3.87–5.11)
RDW: 12.8 % (ref 11.5–15.5)
WBC: 6.5 10*3/uL (ref 4.0–10.5)

## 2014-08-09 LAB — BRAIN NATRIURETIC PEPTIDE: B Natriuretic Peptide: 274.2 pg/mL — ABNORMAL HIGH (ref 0.0–100.0)

## 2014-08-09 LAB — TROPONIN I: Troponin I: <0.03 ng/mL (ref <0.031)

## 2014-08-09 MED ORDER — ONDANSETRON HCL 4 MG/2ML IJ SOLN
4.0000 mg | Freq: Once | INTRAMUSCULAR | Status: AC
  Administered 2014-08-09: 4 mg via INTRAVENOUS
  Filled 2014-08-09: qty 2

## 2014-08-09 MED ORDER — KCL IN DEXTROSE-NACL 20-5-0.45 MEQ/L-%-% IV SOLN
Freq: Once | INTRAVENOUS | Status: DC
  Filled 2014-08-09: qty 1000

## 2014-08-09 MED ORDER — SUCRALFATE 1 GM/10ML PO SUSP: 1.0000 g | Freq: Three times a day (TID) | ORAL | Status: DC

## 2014-08-09 MED ORDER — MORPHINE SULFATE 4 MG/ML IJ SOLN
4.0000 mg | Freq: Once | INTRAMUSCULAR | Status: AC
  Administered 2014-08-09: 4 mg via INTRAVENOUS
  Filled 2014-08-09: qty 1

## 2014-08-09 MED ORDER — PROMETHAZINE HCL 25 MG/ML IJ SOLN
12.5000 mg | Freq: Once | INTRAMUSCULAR | Status: AC
  Administered 2014-08-09: 12.5 mg via INTRAVENOUS
  Filled 2014-08-09: qty 1

## 2014-08-09 MED ORDER — SODIUM CHLORIDE 0.9 % IV BOLUS (SEPSIS)
500.0000 mL | Freq: Once | INTRAVENOUS | Status: AC
  Administered 2014-08-09: 500 mL via INTRAVENOUS

## 2014-08-09 NOTE — ED Notes (Signed)
Asked pt how she felt and discussed that she was up for discharge, pt reports, "I feel better, but still have some nausea and pain in my stomach and head. I'm not ready to go home." Pt has diet tray at bedside, informed pt since she is vomiting and not feeling well that she shouldn't eat anything and that we will be giving her some more medication for nausea. Pt then reports, "I can eat. It was the soda that made my stomach hurt, then you gave me the 2 medicines and I vomited."

## 2014-08-09 NOTE — Discharge Instructions (Signed)
Return here as needed. Follow up with your primary doctor. Your testing here today was normal.

## 2014-08-09 NOTE — ED Notes (Signed)
Patient transported to X-ray 

## 2014-08-09 NOTE — ED Provider Notes (Signed)
CSN: [id number]     Arrival date & time 08/09/14  5974 History   First MD Initiated Contact with Patient 08/09/14 1000     Chief Complaint  Patient presents with  . Shortness of Breath     (Consider location/radiation/quality/duration/timing/severity/associated sxs/prior Treatment) HPI Patient presents to the emergency department with shortness of breath since last night along with nausea and headache.  The patient states that she has had a history of CHF, but her ejection fraction was 60% of her last echo.  The patient states that she does not have any chest pain, vomiting, diarrhea, weakness, dizziness, blurred vision, back pain, neck pain, rash, fever, cough, runny nose, sore throat, abdominal pain, or syncope.  The patient states that nothing seems make her condition better or worse.  Patient has had a history of shortness of breath.  She is seen multiple times for this.  The patient does not have any swelling or pain in her legs Past Medical History  Diagnosis Date  . Systolic heart failure     May 2011 EF 35-40%, 04/03/11 EF 25-30%, 06/27/11 EF 30-35%  . Hypertension   . Asthma   . Obesity   . GERD (gastroesophageal reflux disease)   . Sleep apnea   . Heart murmur     dx 2 yrs ago per pt  . Seasonal allergies   . Shortness of breath     occasional - exercise induced  . Diabetes mellitus     recent dx 11/17/11 - started med 11/18/11  . Goiter 07/27/2011    non-neoplastic goiter - fine needle aspiration - benign  . Low back pain     history  . Anxiety     no meds  . Depression     no meds  . IBS (irritable bowel syndrome)     tx with diet per pt  . Herpes genitalis in women    Past Surgical History  Procedure Laterality Date  . Tubal ligation    . Diagnostic laparoscopy      of pelvis  . Novasure ablation      10/2005  . Dilation and curettage of uterus  12/2006,  10/2005    hysteroscopy surgery x 2  . Svd      x 3  . Wisdom tooth extraction    . Colonscopy    .  Cardiac catherization  2004    [place], [place] - Dr Lynnell Jude  . Laparoscopic assisted vaginal hysterectomy  11/23/2011    Procedure: LAPAROSCOPIC ASSISTED VAGINAL HYSTERECTOMY;  Surgeon: Jolayne Haines, MD;  Location: [facility];  Service: Gynecology;  Laterality: N/A;  . Cystoscopy  11/23/2011    Procedure: CYSTOSCOPY;  Surgeon: Jolayne Haines, MD;  Location: [facility];  Service: Gynecology;  Laterality: N/A;  . Salpingoophorectomy  11/23/2011    Procedure: SALPINGO OOPHERECTOMY;  Surgeon: Jolayne Haines, MD;  Location: [facility];  Service: Gynecology;  Laterality: Bilateral;  . Abdominal hysterectomy     Family History  Problem Relation Age of Onset  . Heart disease Maternal Aunt   . Breast cancer Mother   . Hypertension Maternal Grandmother   . Breast cancer Maternal Aunt    History  Substance Use Topics  . Smoking status: Never Smoker   . Smokeless tobacco: Never Used  . Alcohol Use: No   OB History    Gravida Para Term Preterm AB TAB SAB Ectopic Multiple Living   7 3   3  1    3  Review of Systems  All other systems negative except as documented in the HPI. All pertinent positives and negatives as reviewed in the HPI.   Allergies  Review of patient's allergies indicates no known allergies.  Home Medications   Prior to Admission medications   Medication Sig Start Date End Date Taking? Authorizing Provider  albuterol (PROVENTIL HFA;VENTOLIN HFA) 108 (90 BASE) MCG/ACT inhaler Inhale 1-2 puffs into the lungs every 6 (six) hours as needed for wheezing or shortness of breath. 05/17/14   Kaitlyn Szekalski, PA-C  aspirin 81 MG chewable tablet Chew 81 mg by mouth daily.    Historical Provider, MD  bisacodyl (BISACODYL) 5 MG EC tablet Take 5 mg by mouth 2 (two) times daily as needed for moderate constipation.    Historical Provider, MD  carvedilol (COREG) 25 MG tablet Take 25 mg by mouth 2 (two) times daily with a meal.    Historical Provider, MD  famotidine (PEPCID) 20 MG tablet Take 20 mg by  mouth daily as needed for heartburn or indigestion.    Historical Provider, MD  furosemide (LASIX) 40 MG tablet Take 40 mg by mouth.    Historical Provider, MD  glyBURIDE (DIABETA) 2.5 MG tablet Take 2.5 mg by mouth daily with breakfast.    Historical Provider, MD  lisinopril (PRINIVIL,ZESTRIL) 20 MG tablet Take 10-20 mg by mouth 2 (two) times daily.    Historical Provider, MD  Polyethyl Glycol-Propyl Glycol (SYSTANE) 0.4-0.3 % SOLN Apply 1 drop to eye 2 (two) times daily as needed (dry eyes).    Historical Provider, MD  polyethylene glycol powder (GLYCOLAX/MIRALAX) powder Take 255 g by mouth 2 (two) times daily. Until daily soft stools  OTC 05/17/14   Alvina Chou, PA-C  spironolactone (ALDACTONE) 25 MG tablet Take 1 tablet (25 mg total) by mouth daily. 04/16/13   Shaune Pascal Bensimhon, MD   BP 124/78 mmHg  Pulse 78  Temp(Src) 97.8 F (36.6 C) (Oral)  Resp 18  SpO2 98% Physical Exam  Constitutional: She is oriented to person, place, and time. She appears well-developed and well-nourished. No distress.  HENT:  Head: Normocephalic and atraumatic.  Mouth/Throat: Oropharynx is clear and moist.  Eyes: Pupils are equal, round, and reactive to light.  Neck: Normal range of motion. Neck supple.  Cardiovascular: Normal rate, regular rhythm and normal heart sounds.  Exam reveals no gallop and no friction rub.   No murmur heard. Pulmonary/Chest: Effort normal and breath sounds normal. No respiratory distress.  Abdominal: Soft. Bowel sounds are normal. She exhibits no distension. There is no tenderness.  Musculoskeletal: She exhibits no edema.  Neurological: She is alert and oriented to person, place, and time. She exhibits normal muscle tone. Coordination normal.  Skin: Skin is warm and dry. No rash noted. No erythema.  Psychiatric: She has a normal mood and affect. Her behavior is normal.  Nursing note and vitals reviewed.   ED Course  Procedures (including critical care time) Labs  Review Labs Reviewed  TROPONIN I  BASIC METABOLIC PANEL  CBC WITH DIFFERENTIAL  BRAIN NATRIURETIC PEPTIDE    Imaging Review No results found.   EKG Interpretation   Date/Time:  Sunday August 09 2014 09:50:05 EST Ventricular Rate:  80 PR Interval:  166 QRS Duration: 152 QT Interval:  438 QTC Calculation: 505 R Axis:   103 Text Interpretation:  Normal sinus rhythm Rightward axis Non-specific  intra-ventricular conduction block Abnormal ECG No significant change  since last tracing Confirmed by BEATON  MD, ROBERT ([id number]) on  08/09/2014  10:07:21 AM     The patient will be discharged home and asked to follow-up with her primary care doctor.  Patient is PERC negative and low risk based on well's criteria.  The patient is advised to increase her fluid intake, rest as much as possible.  Her vital signs been stable here in the emergency department MDM   Final diagnoses:  Dyspnea       Brent General, PA-C 08/09/14 [place], MD 08/09/14 [phone]

## 2014-08-09 NOTE — ED Notes (Signed)
Pt still in x-ray

## 2014-08-09 NOTE — ED Notes (Addendum)
Pt reports sob since last night. Denies cough but does having hx of chf and reports recent swelling to hands and feet. Reports nausea. Airway intact at triage.

## 2014-08-09 NOTE — ED Notes (Signed)
Pt states feeling better after eating meal tray. Reports feeling dizzy "maybe from all the pain medication." Gerald Stabs PA at bedside

## 2014-08-09 NOTE — ED Notes (Signed)
Pt reports feeling lightheaded after vomiting. Reports nausea is better after phenergan. Meal tray given back to patient.

## 2014-08-11 ENCOUNTER — Telehealth: Payer: Self-pay | Admitting: Internal Medicine

## 2014-08-11 NOTE — Telephone Encounter (Signed)
Spoke with pt, she wants to be sure she still needs the ct chest tomorrow since she had a cxr in the hospital.  I advised her that the CT chest is more detailed and that she should continue to get the CT chest tomorrow.  Pt understands.  Nothing further needed.

## 2014-08-12 ENCOUNTER — Ambulatory Visit (INDEPENDENT_AMBULATORY_CARE_PROVIDER_SITE_OTHER)
Admission: RE | Admit: 2014-08-12 | Discharge: 2014-08-12 | Disposition: A | Discharge status: Home / Self Care | Payer: 59 | Source: Ambulatory Visit | Attending: Internal Medicine | Admitting: Internal Medicine

## 2014-08-12 DIAGNOSIS — R0602 Shortness of breath: Secondary | ICD-10-CM

## 2014-08-12 DIAGNOSIS — R0600 Dyspnea, unspecified: Secondary | ICD-10-CM

## 2014-08-19 ENCOUNTER — Encounter: Payer: Self-pay | Admitting: Adult Health

## 2014-08-19 ENCOUNTER — Ambulatory Visit (INDEPENDENT_AMBULATORY_CARE_PROVIDER_SITE_OTHER): Payer: 59 | Admitting: Adult Health

## 2014-08-19 ENCOUNTER — Other Ambulatory Visit (INDEPENDENT_AMBULATORY_CARE_PROVIDER_SITE_OTHER): Payer: 59

## 2014-08-19 VITALS — BP 134/88 | HR 97 | Temp 97.8°F | Ht 64.0 in | Wt 193.0 lb

## 2014-08-19 DIAGNOSIS — I5022 Chronic systolic (congestive) heart failure: Secondary | ICD-10-CM

## 2014-08-19 DIAGNOSIS — G4733 Obstructive sleep apnea (adult) (pediatric): Secondary | ICD-10-CM

## 2014-08-19 DIAGNOSIS — R0600 Dyspnea, unspecified: Secondary | ICD-10-CM

## 2014-08-19 LAB — BASIC METABOLIC PANEL
BUN: 14 mg/dL (ref 6–23)
CO2: 29 mEq/L (ref 19–32)
CREATININE: 0.62 mg/dL (ref 0.40–1.20)
Calcium: 9.1 mg/dL (ref 8.4–10.5)
Chloride: 104 mEq/L (ref 96–112)
GFR: 131.67 mL/min (ref >60.00)
GLUCOSE: 221 mg/dL — AB (ref 70–99)
Potassium: 4.1 mEq/L (ref 3.5–5.1)
Sodium: 139 mEq/L (ref 135–145)

## 2014-08-19 LAB — BRAIN NATRIURETIC PEPTIDE: Pro B Natriuretic peptide (BNP): 316 pg/mL — ABNORMAL HIGH (ref 0.0–100.0)

## 2014-08-19 MED ORDER — OLMESARTAN MEDOXOMIL 20 MG PO TABS: 20.0000 mg | ORAL_TABLET | Freq: Every day | ORAL | Status: DC

## 2014-08-19 MED ORDER — FUROSEMIDE 40 MG PO TABS: 40.0000 mg | ORAL_TABLET | Freq: Every day | ORAL | Status: DC

## 2014-08-19 NOTE — Progress Notes (Signed)
Subjective:    Patient ID: Susan Peck, female    DOB: October 05, 1965, 49 y.o.   MRN: [id number]  HPI OV 08/05/2014  Chief Complaint  Patient presents with  . Pulmonary Consult    Pt here for self referral for SOB. Pt stated she has SOB with and without activity. Pt stated her SOB increase at night. Pt using proair every 6 hours without relief.    49 year old female has a long-standing history of asthma for which she is only on albuterol as needed. She also has history of chronic systolic heart failure but per chart review 2049 has normal ejection fraction. She has sleep apnea for which she has not followed up with Dr. Gwenette Greet. She reports insidious onset of dyspnea starting October 2015 and is been progressive since then. In early October 2015 she had an emergency department visit within our health system. Review of the records and according to her history it sounded like and mild asthma flareup and she was discharged with albuterol when necessary and Flonase. Then in the middle of November 2015 she had another ER visit during which time lab work was normal and chest x-ray was normal but BNP was elevated 700s and she was given IV Lasix and discharged with advice to increase his scheduled Lasix. However she reports continued worsening of dyspnea. Initially only with exertion and relieved by rest. But now sometimes even at rest she has a subjective sensation of having to take a deep breath. There is no associated chest pain or cough or wheezing or pedal edema.  Spirometry today in the office: shows moderate restriction  Also she is complaining of blocking sensation of ear and throat lump feeling - wants ENT Referral   08/19/2014 Follow up : Dyspnea and Test Results.  Patient returns for a two-week follow-up Patient was seen 2 weeks ago for pulmonary consultation for dyspnea. She had PFT on January 15 with an FEV1 at 72%, ratio 88, no significant bronchodilator response, FVC decreased at 65%,  DLCO 78% High-resolution CT chest showed no evidence for ILD, small bilateral pleural effusions, right greater than left. Mild diffuse groundglass attenuation throughout the lungs most compatible with pulmonary edema/CHF Has CHF followed by cardiology  Last echo showed 2014 with EF 60%, DD gr 1 , (prev EF 30% on prior echos )  Complains of dyspnea, worse at night , cough , wheezing  More ankle swelling.   Has been treated several times in ER for dyspnea. Has ov with cardiology next week.  BNP was elevated ~700 in Nov 2015.  Has been out of Lasix, coreg, aldactone  for 2-3 months . She ran out of insurance and did not have rx coverage.  Has OSA not wearing CPAP.  Seen by PCP recently given new prescriptions but has not picked up yet.         Review of Systems  Constitutional:   No  weight loss, night sweats,  Fevers, chills, + fatigue, or  lassitude.  HEENT:   No headaches,  Difficulty swallowing,  Tooth/dental problems, or  Sore throat,                No sneezing, itching, ear ache,  +nasal congestion, post nasal drip,   CV:  No chest pain,  Orthopnea, PND, swelling in lower extremities, anasarca, dizziness, palpitations, syncope.   GI  No heartburn, indigestion, abdominal pain, nausea, vomiting, diarrhea, change in bowel habits, loss of appetite, bloody stools.   Resp:    No  chest wall deformity  Skin: no rash or lesions.  GU: no dysuria, change in color of urine, no urgency or frequency.  No flank pain, no hematuria   MS:  No joint pain or swelling.  No decreased range of motion.  No back pain.  Psych:  No change in mood or affect. No depression or anxiety.  No memory loss.          Objective:   Physical Exam  GEN: A/Ox3; pleasant , NAD, well nourished , obese   HEENT:  [place]/AT,  EACs-clear, TMs-wnl, NOSE-clear, THROAT-clear, no lesions, no postnasal drip or exudate noted.   NECK:  Supple w/ fair ROM; no JVD; normal carotid impulses w/o bruits; no thyromegaly or  nodules palpated; no lymphadenopathy.  RESP  Clear  P & A; w/o, wheezes/ rales/ or rhonchi.no accessory muscle use, no dullness to percussion  CARD:  RRR, no m/r/g  , tr  peripheral edema, pulses intact, no cyanosis or clubbing.  GI:   Soft & nt; nml bowel sounds; no organomegaly or masses detected.  Musco: Warm bil, no deformities or joint swelling noted.   Neuro: alert, no focal deficits noted.    Skin: Warm, no lesions or rashes   Assessment & Plan:

## 2014-08-19 NOTE — Patient Instructions (Addendum)
Restart Lasix 40mg  daily  Stop Lisinopril .  Begin Benicar 20mg   daily .  Would avoid ACE inhibitors (Lisinopril) in future as can make your cough and wheezing worse.  Follow up with Cardiology next week as planned.  Labs today .  Please check at pharmacy to see if your medications have been sent to pharmacy by your primary doctor and if not will need to call them as all these medications are very important for your High blood presure, congestive heart failure.  Follow up Dr. Chase Caller in 6 weeks and As needed

## 2014-08-20 ENCOUNTER — Ambulatory Visit: Payer: 59 | Admitting: Adult Health

## 2014-08-21 NOTE — Progress Notes (Signed)
Quick Note:  Called spoke with patient, advised of lab results / recs as stated by TP. Pt verbalized her understanding and denied any questions. She has already followed up with PCP regarding DM, restarting Glimepiride. She is seeing her cardiologist next week and will follow up w/ them regarding fluid, etc. ______

## 2014-08-25 ENCOUNTER — Ambulatory Visit (HOSPITAL_COMMUNITY)
Admission: RE | Admit: 2014-08-25 | Discharge: 2014-08-25 | Disposition: A | Discharge status: Home / Self Care | Payer: 59 | Source: Ambulatory Visit | Attending: Internal Medicine | Admitting: Internal Medicine

## 2014-08-25 VITALS — BP 112/84 | HR 78 | Wt 195.0 lb

## 2014-08-25 DIAGNOSIS — I5022 Chronic systolic (congestive) heart failure: Secondary | ICD-10-CM | POA: Insufficient documentation

## 2014-08-25 DIAGNOSIS — E049 Nontoxic goiter, unspecified: Secondary | ICD-10-CM | POA: Diagnosis not present

## 2014-08-25 DIAGNOSIS — I10 Essential (primary) hypertension: Secondary | ICD-10-CM | POA: Insufficient documentation

## 2014-08-25 DIAGNOSIS — G4733 Obstructive sleep apnea (adult) (pediatric): Secondary | ICD-10-CM | POA: Insufficient documentation

## 2014-08-25 DIAGNOSIS — R0600 Dyspnea, unspecified: Secondary | ICD-10-CM

## 2014-08-25 MED ORDER — FUROSEMIDE 40 MG PO TABS: 40.0000 mg | ORAL_TABLET | Freq: Two times a day (BID) | ORAL | Status: DC

## 2014-08-25 MED ORDER — POTASSIUM CHLORIDE CRYS ER 20 MEQ PO TBCR: 20.0000 meq | EXTENDED_RELEASE_TABLET | Freq: Two times a day (BID) | ORAL | Status: DC

## 2014-08-25 MED ORDER — CARVEDILOL 25 MG PO TABS
25.0000 mg | ORAL_TABLET | Freq: Two times a day (BID) | ORAL | Status: DC
Start: 2014-08-25 — End: 2014-12-27

## 2014-08-25 MED ORDER — OLMESARTAN MEDOXOMIL 20 MG PO TABS: 20.0000 mg | ORAL_TABLET | Freq: Every day | ORAL | Status: DC

## 2014-08-25 NOTE — Patient Instructions (Signed)
Increase Furosemide to 40 mg Twice daily   Start Potassium (k-dur) 20 meq Twice daily   Labs in 1 week (bmet, bnp)  Your physician has requested that you have an echocardiogram. Echocardiography is a painless test that uses sound waves to create images of your heart. It provides your doctor with information about the size and shape of your heart and how well your heart's chambers and valves are working. This procedure takes approximately one hour. There are no restrictions for this procedure.  Your physician recommends that you schedule a follow-up appointment in: 2-3 weeks

## 2014-08-25 NOTE — Addendum Note (Signed)
Encounter addended by: Micki Riley, RN on: 08/25/2014 11:24 AM<BR>     Documentation filed: Orders

## 2014-08-25 NOTE — Addendum Note (Signed)
Encounter addended by: Scarlette Calico, RN on: 08/25/2014 11:17 AM<BR>     Documentation filed: Dx Association, Patient Instructions Section, Orders

## 2014-08-25 NOTE — Progress Notes (Signed)
Patient ID: Susan Peck, female   DOB: [birth year], 49 y.o.   MRN: [id number]  PCP: Dr Arnoldo Morale GYN: Dr Carolin Guernsey Pulmonologist: Dr Gwenette Greet  HPI:   Susan Peck is a [age] year old African American female with systolic heart failure, HTN, GERD, 11/23/11 S/P Hysterectomy, multinodular goiter and asthma.   2004 cardiac cath by Dr Lynnell Jude in [place], [place] with one blockage noted - no stent. EF said to be normal. About 1 year ago at [facility] noted murmur and she was referred to Dr Tamala Julian EF 25-30%.  Myoview 02/03/10 EF 42% No ischemia Septal and apical hypokinesis  Admitted in 2012 with acute HF after not taking her meds.She was restarted on HF medications.        05/19/2011  Sleep study revealed moderate obstructive sleep apnea with desaturations noted.   04/03/2011  ECHO EF 25-30% 06/26/2012 ECHO EF 30-35%  02/29/2012 ECHO EF 35-40%  11/18/12 ECHO EF 55% Inferior wall mildly hypokinetic with septal HK 11/14 Cardiac MRI  EF 52%- septal bounce suggestive of LBBB. I suspect that the dyssynchrony from LBBB leads to the low calculated EF. Normal RV size and systolic function. No myocardial delayed enhancement, so no definite evidence for prior myocardial infarction, myocarditis, or infiltrative disease. 1/16 CT chest - small pleural effusions with early pulmonary edema. No ILD.   She is here for routine follow up. Says she remains SOB with mild exertion. + orthopnea and PND. + LE edema. Seen multiple times in ER for DOE. BPN ~ 300. Weight down about 8 pounds at home. Weight 192.  Not using CPAP much. Drinking 4 bottles of water per day. Taking lasix 40 daily.   ROS: All other systems normal except as mentioned in HPI, past medical history and problem list.    Past Medical History  Diagnosis Date  . Systolic heart failure     May 2011 EF 35-40%, 04/03/11 EF 25-30%, 06/27/11 EF 30-35%  . Hypertension   . Asthma   . Obesity   . GERD (gastroesophageal reflux disease)   . Sleep apnea   . Heart murmur      dx 2 yrs ago per pt  . Seasonal allergies   . Shortness of breath     occasional - exercise induced  . Diabetes mellitus     recent dx 11/17/11 - started med 11/18/11  . Goiter 07/27/2011    non-neoplastic goiter - fine needle aspiration - benign  . Low back pain     history  . Anxiety     no meds  . Depression     no meds  . IBS (irritable bowel syndrome)     tx with diet per pt  . Herpes genitalis in women     Current Outpatient Prescriptions  Medication Sig Dispense Refill  . aspirin 81 MG chewable tablet Chew 81 mg by mouth daily.    . Biotin 5 MG CAPS Take 1 capsule by mouth daily.    . carvedilol (COREG) 25 MG tablet Take 25 mg by mouth 2 (two) times daily with a meal.    . famotidine (PEPCID) 20 MG tablet Take 20 mg by mouth daily as needed for heartburn or indigestion.    . furosemide (LASIX) 40 MG tablet Take 1 tablet (40 mg total) by mouth daily. 30 tablet 1  . glyBURIDE (DIABETA) 2.5 MG tablet Take 2.5 mg by mouth daily with breakfast.    . metoCLOPramide (REGLAN) 10 MG tablet Take 10 mg by mouth 4 (  four) times daily.    . naproxen (NAPROSYN) 500 MG tablet Take 500 mg by mouth 2 (two) times daily with a meal.    . olmesartan (BENICAR) 20 MG tablet Take 1 tablet (20 mg total) by mouth daily. 30 tablet 1  . omeprazole (PRILOSEC) 20 MG capsule Take 20 mg by mouth 2 (two) times daily before a meal.    . Polyethyl Glycol-Propyl Glycol (SYSTANE) 0.4-0.3 % SOLN Apply 1 drop to eye 2 (two) times daily as needed (dry eyes).    . polyethylene glycol powder (GLYCOLAX/MIRALAX) powder Take 255 g by mouth 2 (two) times daily. Until daily soft stools  OTC 850 g 0  . albuterol (PROVENTIL HFA;VENTOLIN HFA) 108 (90 BASE) MCG/ACT inhaler Inhale 1-2 puffs into the lungs every 6 (six) hours as needed for wheezing or shortness of breath. 1 Inhaler 0  . [DISCONTINUED] eplerenone (INSPRA) 25 MG tablet Take 1 tablet (25 mg total) by mouth daily. 30 tablet 6   No current facility-administered  medications for this encounter.     Allergies  Allergen Reactions  . Shrimp [Shellfish Allergy] Anaphylaxis    History   Social History  . Marital Status: Married    Spouse Name: Susan Peck    Number of Children: Y  . Years of Education: Susan Peck   Occupational History  . 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 Smoking status: Never Smoker   . Smokeless tobacco: Never Used  . Alcohol Use: No  . Drug Use: No  . Sexual Activity: Yes    Birth Control/ Protection: Surgical   Other Topics Concern  . Not on file   Social History Narrative   She lives with her husband and son.  She is works for [employer].    Family History  Problem Relation Age of Onset  . Heart disease Maternal Aunt   . Breast cancer Mother   . Hypertension Maternal Grandmother   . Breast cancer Maternal Aunt     PHYSICAL EXAM: Filed Vitals:   08/25/14 1033  BP: 112/84  Pulse: 78  General:  Well appearing. No respiratory difficulty HEENT:  Normal Neck: supple. JVP 7-8 Carotids 2+ bilat; no bruits. No lymphadenopathy. +  nodular goiter Cor: PMI nondisplaced. Regular rate & rhythm. No rubs or murmurs.  Lungs: clear Abdomen: soft, nontender, obese, nondistended. No hepatosplenomegaly. No bruits or masses. Good bowel sounds. Extremities: no cyanosis, clubbing, rash., tr-1+ edema  Neuro: alert & oriented x 3, cranial nerves grossly intact. moves all 4 extremities w/o difficulty. Affect pleasant.   ASSESSMENT & PLAN: 1. Chronic systolic HF    --Last imaging was in 2014 with normal EF. Now with NYHA III symptoms. Mild volume overload on exam'    --Will repeat echo    --Increase lasix to 40 bid with Kcl 20 bid    --Continue carvedilol 25 bid and benicar 20 daily    --Limit fluid intake.     --Labs next week    --RTC in 2-3 weeks  2. HTN    --Blood pressure well controlled. Continue current regimen.  3. ? H/o CAD   --No evidence of ischemia.cMRI without scar. Continue  current regimen.   4. OSA  -- encouraged her to use CPAP as much as possible.  5. Goiter  -- followed by PCP. TSH in 6/15 was ok.   Susan Gullo,MD 10:34 AM

## 2014-08-31 NOTE — Assessment & Plan Note (Addendum)
Encouraged on CPAP use -unclear if pt has machine or not  Will need to discuss further on return  Wt loss

## 2014-08-31 NOTE — Assessment & Plan Note (Signed)
Suspect is multifactoral with underlying CHF -no sign of ILD  Or COPD .  PFT showed  FEV1 at 72%, ratio 88, no significant bronchodilator response, FVC decreased at 65%, DLCO 78% High-resolution CT chest showed no evidence for ILD, small bilateral pleural effusions, right greater than left. Mild diffuse groundglass attenuation throughout the lungs most compatible with pulmonary edema/CHF Last echo showed 2014 with EF 60%, DD gr 1 , (prev EF 30% on prior echos )   Does not appear to have COPD(never smoker )  , possibly asthma but no airflow obstruction or BD reversibility noted with acute symptoms.  ACE may be causing recurrent cough that is contributing to symptoms as well  Can consider MCT in future for asthma dx.   Plan  Restart Lasix 40mg  daily  Stop Lisinopril .  Begin Benicar 20mg   daily .  Would avoid ACE inhibitors (Lisinopril) in future as can make your cough and wheezing worse.  Follow up with Cardiology next week as planned.  Labs today .  Please check at pharmacy to see if your medications have been sent to pharmacy by your primary doctor and if not will need to call them as all these medications are very important for your High blood presure, congestive heart failure.  Follow up Dr. Chase Caller in 6 weeks and As needed

## 2014-08-31 NOTE — Assessment & Plan Note (Signed)
Suspect decompensated CHF with medication noncompliance  She has not been taking her diuretics lately d/t lost insurance  Recommend follow up with Cardiology  Sample given of [place] today with bnp   Plan  Restart Lasix 40mg  daily  Stop Lisinopril .  Begin Benicar 20mg   daily .  Would avoid ACE inhibitors (Lisinopril) in future as can make your cough and wheezing worse.  Follow up with Cardiology next week as planned.  Labs today .  Please check at pharmacy to see if your medications have been sent to pharmacy by your primary doctor and if not will need to call them as all these medications are very important for your High blood presure, congestive heart failure.  Follow up Dr. Chase Caller in 6 weeks and As needed

## 2014-09-03 ENCOUNTER — Ambulatory Visit (HOSPITAL_COMMUNITY)
Admission: RE | Admit: 2014-09-03 | Discharge: 2014-09-03 | Disposition: A | Discharge status: Home / Self Care | Payer: 59 | Source: Ambulatory Visit | Attending: Family Medicine | Admitting: Family Medicine

## 2014-09-03 DIAGNOSIS — I509 Heart failure, unspecified: Secondary | ICD-10-CM

## 2014-09-03 DIAGNOSIS — I5022 Chronic systolic (congestive) heart failure: Secondary | ICD-10-CM | POA: Insufficient documentation

## 2014-09-03 NOTE — Progress Notes (Signed)
  Echocardiogram 2D Echocardiogram has been performed.  Susan Peck M 09/03/2014, 10:02 AM

## 2014-09-17 ENCOUNTER — Telehealth (HOSPITAL_COMMUNITY): Payer: Self-pay | Admitting: *Deleted

## 2014-09-17 ENCOUNTER — Ambulatory Visit (HOSPITAL_COMMUNITY)
Admission: RE | Admit: 2014-09-17 | Discharge: 2014-09-17 | Disposition: A | Discharge status: Home / Self Care | Payer: 59 | Source: Ambulatory Visit | Attending: Cardiology | Admitting: Cardiology

## 2014-09-17 VITALS — BP 128/80 | HR 74 | Wt 196.0 lb

## 2014-09-17 DIAGNOSIS — E049 Nontoxic goiter, unspecified: Secondary | ICD-10-CM

## 2014-09-17 DIAGNOSIS — I159 Secondary hypertension, unspecified: Secondary | ICD-10-CM

## 2014-09-17 DIAGNOSIS — R0600 Dyspnea, unspecified: Secondary | ICD-10-CM

## 2014-09-17 DIAGNOSIS — I10 Essential (primary) hypertension: Secondary | ICD-10-CM | POA: Diagnosis not present

## 2014-09-17 DIAGNOSIS — I5022 Chronic systolic (congestive) heart failure: Secondary | ICD-10-CM

## 2014-09-17 DIAGNOSIS — G4733 Obstructive sleep apnea (adult) (pediatric): Secondary | ICD-10-CM

## 2014-09-17 MED ORDER — SPIRONOLACTONE 25 MG PO TABS: 12.5000 mg | ORAL_TABLET | Freq: Every day | ORAL | Status: DC

## 2014-09-17 MED ORDER — LOSARTAN POTASSIUM 50 MG PO TABS: 50.0000 mg | ORAL_TABLET | Freq: Every day | ORAL | Status: DC

## 2014-09-17 MED ORDER — LOSARTAN POTASSIUM 25 MG PO TABS
25.0000 mg | ORAL_TABLET | Freq: Every day | ORAL | Status: DC
Start: 2014-09-17 — End: 2014-09-17

## 2014-09-17 MED ORDER — EPLERENONE 25 MG PO TABS: 12.5000 mg | ORAL_TABLET | Freq: Every day | ORAL | Status: DC

## 2014-09-17 NOTE — Telephone Encounter (Signed)
Pt called back to let us know that she can not take Arlyce Harman as ordered today because when she took it in the past it caused breast tenderness, discussed w/Amy Ninfa Meeker, NP will have pt take Inspra 12.5 mg instead, pharmacy notidifed

## 2014-09-17 NOTE — Progress Notes (Addendum)
Patient ID: ALICE BURNSIDE, female   DOB: 1965-08-01, 49 y.o.   MRN: [id number]  PCP: Dr Ayesha Rumpf GYN: Dr Carolin Guernsey Pulmonologist: Dr Gwenette Greet  HPI:   Sema is a 49 year old African American female with systolic heart failure, HTN, GERD, 11/23/11 S/P Hysterectomy, multinodular goiter and asthma.   2004 cardiac cath by Dr Lynnell Jude in [place], [place] with one blockage noted - no stent. EF said to be normal. About 1 year ago at [facility] noted murmur and she was referred to Dr Tamala Julian EF 25-30%.  She is here for routine follow up. Just started HF meds again in January  (had been off for 4 months). Last visit lasix was increased. Overall feeling better. Occasionally dyspneic. SOB with steps. Sleeps with HOB elevated. Weight at home 189-193 pounds. Working 9 hour a day. Not sure she can work full time.  Having difficulty sleeping. CPAP broken. Trying to eat low salt foods.   05/19/2011  Sleep study revealed moderate obstructive sleep apnea with desaturations noted.   04/03/2011  ECHO EF 25-30% 06/26/2012 ECHO EF 30-35%  02/29/2012 ECHO EF 35-40%  11/18/12 ECHO EF 55% Inferior wall mildly hypokinetic with septal HK Myoview 02/03/10 EF 42% No ischemia Septal and apical hypokinesis 11/14 Cardiac MRI  EF 52%- septal bounce suggestive of LBBB. I suspect that the dyssynchrony from LBBB leads to the low calculated EF. Normal RV size and systolic function. No myocardial delayed enhancement, so no definite evidence for prior myocardial infarction, myocarditis, or infiltrative disease. 1/16 CT chest - small pleural effusions with early pulmonary edema. No ILD. 09/03/2014: ECHO EF 20-25%   Labs 08/19/14: K 4.1 Creatinine 0.62    ROS: All other systems normal except as mentioned in HPI, past medical history and problem list.    Past Medical History  Diagnosis Date  . Systolic heart failure     May 2011 EF 35-40%, 04/03/11 EF 25-30%, 06/27/11 EF 30-35%  . Hypertension   . Asthma   . Obesity   . GERD  (gastroesophageal reflux disease)   . Sleep apnea   . Heart murmur     dx 2 yrs ago per pt  . Seasonal allergies   . Shortness of breath     occasional - exercise induced  . Diabetes mellitus     recent dx 11/17/11 - started med 11/18/11  . Goiter 07/27/2011    non-neoplastic goiter - fine needle aspiration - benign  . Low back pain     history  . Anxiety     no meds  . Depression     no meds  . IBS (irritable bowel syndrome)     tx with diet per pt  . Herpes genitalis in women     Current Outpatient Prescriptions  Medication Sig Dispense Refill  . albuterol (PROVENTIL HFA;VENTOLIN HFA) 108 (90 BASE) MCG/ACT inhaler Inhale 1-2 puffs into the lungs every 6 (six) hours as needed for wheezing or shortness of breath. 1 Inhaler 0  . aspirin 81 MG chewable tablet Chew 81 mg by mouth daily.    . carvedilol (COREG) 25 MG tablet Take 1 tablet (25 mg total) by mouth 2 (two) times daily with a meal. 60 tablet 3  . famotidine (PEPCID) 20 MG tablet Take 20 mg by mouth daily as needed for heartburn or indigestion.    . furosemide (LASIX) 40 MG tablet Take 1 tablet (40 mg total) by mouth 2 (two) times daily. 60 tablet 3  . glyBURIDE (DIABETA) 2.5  MG tablet Take 2.5 mg by mouth daily with breakfast.    . metoCLOPramide (REGLAN) 10 MG tablet Take 10 mg by mouth 4 (four) times daily.    Marland Kitchen olmesartan (BENICAR) 20 MG tablet Take 1 tablet (20 mg total) by mouth daily. 30 tablet 3  . omeprazole (PRILOSEC) 20 MG capsule Take 20 mg by mouth 2 (two) times daily before a meal.    . Polyethyl Glycol-Propyl Glycol (SYSTANE) 0.4-0.3 % SOLN Apply 1 drop to eye 2 (two) times daily as needed (dry eyes).    . polyethylene glycol powder (GLYCOLAX/MIRALAX) powder Take 255 g by mouth 2 (two) times daily. Until daily soft stools  OTC 850 g 0  . potassium chloride SA (K-DUR,KLOR-CON) 20 MEQ tablet Take 1 tablet (20 mEq total) by mouth 2 (two) times daily. 60 tablet 3  . [DISCONTINUED] eplerenone (INSPRA) 25 MG tablet  Take 1 tablet (25 mg total) by mouth daily. 30 tablet 6   No current facility-administered medications for this encounter.     Allergies  Allergen Reactions  . Shrimp [Shellfish Allergy] Anaphylaxis    History   Social History  . Marital Status: Married    Spouse Name: N/A  . Number of Children: Y  . 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 Smoking status: Never Smoker   . Smokeless tobacco: Never Used  . Alcohol Use: No  . Drug Use: No  . Sexual Activity: Yes    Birth Control/ Protection: Surgical   Other Topics Concern  . Not on file   Social History Narrative   She lives with her husband and son.  She is works for [employer].    Family History  Problem Relation Age of Onset  . Heart disease Maternal Aunt   . Breast cancer Mother   . Hypertension Maternal Grandmother   . Breast cancer Maternal Aunt     PHYSICAL EXAM: Filed Vitals:   09/17/14 1158  BP: 128/80  Pulse: 74  General:  Well appearing. No respiratory difficulty HEENT:  Normal Neck: supple. JVP 6-7 Carotids 2+ bilat; no bruits. No lymphadenopathy. +  nodular goiter Cor: PMI nondisplaced. Regular rate & rhythm. No rubs or murmurs.  Lungs: clear Abdomen: soft, nontender, obese, nondistended. No hepatosplenomegaly. No bruits or masses. Good bowel sounds. Extremities: no cyanosis, clubbing, rash., tr+ edema  Neuro: alert & oriented x 3, cranial nerves grossly intact. moves all 4 extremities w/o difficulty. Affect pleasant.   ASSESSMENT & PLAN: 1. Chronic systolic HF-NICM    Previous EF 60% 4/201 but back down -->ECHO 09/03/2014. EF 20-25% Grade II DD.  She had been off all HF meds for 4 months. Restarted HF meds the end of January. NYHA II-III-Volume status ok despite weight gain. Continue lasix to 40 bid with Kcl 20 bid -On goal dose of carvedilol 25 bid .  -Stop benicar due to cost. Start losartan 50 mg daily   -Add 12.5 mg spironoloactone daily  Check BMET in 10 days.  Reinforced medication compliance, low salt diet, and limiting fluid intake to < 2 liters per day.  Plan to repeat ECHO in 3 months after HF meds optimized again.  2. HTN    --Blood pressure well controlled.  3. ? H/o CAD  --No evidence of ischemia.cMRI without scar. Continue current regimen.  4. OSA  -- encouraged her to use CPAP as much as possible. Needs  CPAP fixed. I asked her follow up today.  5. Goiter  -- followed by PCP. TSH in 6/15 was ok.   Follow up in 10 days for BMET and 4 weeks for visit. Refer to SW for disability.   Sitlali Koerner, NP-C  12:02 PM

## 2014-09-17 NOTE — Patient Instructions (Signed)
Stop Benicar  Start Losartan 50 mg daily  Start Spironolactone 12.5 mg (1/2 tab) daily  Labs in 10 days (bmet)  Your physician recommends that you schedule a follow-up appointment in: 1 month

## 2014-10-09 ENCOUNTER — Ambulatory Visit: Payer: 59 | Admitting: Internal Medicine

## 2014-10-15 ENCOUNTER — Telehealth (HOSPITAL_COMMUNITY): Payer: Self-pay | Admitting: *Deleted

## 2014-10-15 ENCOUNTER — Ambulatory Visit (HOSPITAL_COMMUNITY)
Admission: RE | Admit: 2014-10-15 | Discharge: 2014-10-15 | Disposition: A | Discharge status: Home / Self Care | Payer: 59 | Source: Ambulatory Visit | Attending: Internal Medicine | Admitting: Internal Medicine

## 2014-10-15 DIAGNOSIS — E049 Nontoxic goiter, unspecified: Secondary | ICD-10-CM | POA: Insufficient documentation

## 2014-10-15 DIAGNOSIS — E119 Type 2 diabetes mellitus without complications: Secondary | ICD-10-CM | POA: Insufficient documentation

## 2014-10-15 DIAGNOSIS — G4730 Sleep apnea, unspecified: Secondary | ICD-10-CM | POA: Insufficient documentation

## 2014-10-15 DIAGNOSIS — Z7982 Long term (current) use of aspirin: Secondary | ICD-10-CM | POA: Insufficient documentation

## 2014-10-15 DIAGNOSIS — I10 Essential (primary) hypertension: Secondary | ICD-10-CM | POA: Diagnosis not present

## 2014-10-15 DIAGNOSIS — J45909 Unspecified asthma, uncomplicated: Secondary | ICD-10-CM | POA: Diagnosis not present

## 2014-10-15 DIAGNOSIS — E669 Obesity, unspecified: Secondary | ICD-10-CM | POA: Insufficient documentation

## 2014-10-15 DIAGNOSIS — I159 Secondary hypertension, unspecified: Secondary | ICD-10-CM

## 2014-10-15 DIAGNOSIS — G4733 Obstructive sleep apnea (adult) (pediatric): Secondary | ICD-10-CM | POA: Insufficient documentation

## 2014-10-15 DIAGNOSIS — K219 Gastro-esophageal reflux disease without esophagitis: Secondary | ICD-10-CM | POA: Diagnosis not present

## 2014-10-15 DIAGNOSIS — I5022 Chronic systolic (congestive) heart failure: Secondary | ICD-10-CM | POA: Diagnosis present

## 2014-10-15 DIAGNOSIS — Z79899 Other long term (current) drug therapy: Secondary | ICD-10-CM | POA: Diagnosis not present

## 2014-10-15 LAB — BASIC METABOLIC PANEL
ANION GAP: 8 (ref 5–15)
BUN: 11 mg/dL (ref 6–23)
CALCIUM: 9 mg/dL (ref 8.4–10.5)
CHLORIDE: 102 mmol/L (ref 96–112)
CO2: 28 mmol/L (ref 19–32)
Creatinine, Ser: 0.74 mg/dL (ref 0.50–1.10)
Glucose, Bld: 203 mg/dL — ABNORMAL HIGH (ref 70–99)
Potassium: 3.9 mmol/L (ref 3.5–5.1)
Sodium: 138 mmol/L (ref 135–145)

## 2014-10-15 NOTE — Patient Instructions (Signed)
Doing great!  Follow up 4 weeks.  Do the following things EVERYDAY: 1) Weigh yourself in the morning before breakfast. Write it down and keep it in a log. 2) Take your medicines as prescribed 3) Eat low salt foods-Limit salt (sodium) to 2000 mg per day.  4) Stay as active as you can everyday 5) Limit all fluids for the day to less than 2 liters  

## 2014-10-15 NOTE — Telephone Encounter (Signed)
-----   Message from Conrad , NP sent at 10/15/2014  4:27 PM EDT ----- Regarding: med  Please call her and increase insrpa to 25 mg daily  Check BMET in 10 days.   Thanks Amy

## 2014-10-15 NOTE — Telephone Encounter (Signed)
Left message to call back  

## 2014-10-15 NOTE — Progress Notes (Signed)
Patient ID: Susan Peck, female   DOB: Jan 17, 1966, 49 y.o.   MRN: [id number]  PCP: Dr Susan Peck GYN: Dr Susan Peck Pulmonologist: Dr Susan Peck  HPI:  Susan Peck is a 49 year old African American female with systolic heart failure, HTN, GERD, 11/23/11 S/P Hysterectomy, multinodular goiter and asthma.   2004 cardiac cath by Dr Susan Peck in [place], [place] with one blockage noted - no stent. EF said to be normal. About 1 year ago at [facility] noted murmur and she was referred to Dr Susan Julian EF 25-30%.  She is here for routine follow up. Last visit inspra  and losartan started. Overall feeling better. Occasionally dyspneic. SOB with steps. Sleeps with HOB elevated. Weight at home 189-193 pounds.  Not sure she can work full time.  Working on disability. Having difficulty sleeping. CPAP fixed.  Trying to eat low salt foods.   05/19/2011  Sleep study revealed moderate obstructive sleep apnea with desaturations noted.   04/03/2011  ECHO EF 25-30% 06/26/2012 ECHO EF 30-35%  02/29/2012 ECHO EF 35-40%  11/18/12 ECHO EF 55% Inferior wall mildly hypokinetic with septal HK Myoview 02/03/10 EF 42% No ischemia Septal and apical hypokinesis 11/14 Cardiac MRI  EF 52%- septal bounce suggestive of LBBB. I suspect that the dyssynchrony from LBBB leads to the low calculated EF. Normal RV size and systolic function. No myocardial delayed enhancement, so no definite evidence for prior myocardial infarction, myocarditis, or infiltrative disease. 1/16 CT chest - small pleural effusions with early pulmonary edema. No ILD. 09/03/2014: ECHO EF 20-25%   Labs 08/19/14: K 4.1 Creatinine 0.62    ROS: All other systems normal except as mentioned in HPI, past medical history and problem list.    Past Medical History  Diagnosis Date  . Systolic heart failure     May 2011 EF 35-40%, 04/03/11 EF 25-30%, 06/27/11 EF 30-35%  . Hypertension   . Asthma   . Obesity   . GERD (gastroesophageal reflux disease)   . Sleep apnea   . Heart murmur      dx 2 yrs ago per pt  . Seasonal allergies   . Shortness of breath     occasional - exercise induced  . Diabetes mellitus     recent dx 11/17/11 - started med 11/18/11  . Goiter 07/27/2011    non-neoplastic goiter - fine needle aspiration - benign  . Low back pain     history  . Anxiety     no meds  . Depression     no meds  . IBS (irritable bowel syndrome)     tx with diet per pt  . Herpes genitalis in women     Current Outpatient Prescriptions  Medication Sig Dispense Refill  . albuterol (PROVENTIL HFA;VENTOLIN HFA) 108 (90 BASE) MCG/ACT inhaler Inhale 1-2 puffs into the lungs every 6 (six) hours as needed for wheezing or shortness of breath. 1 Inhaler 0  . aspirin 81 MG chewable tablet Chew 81 mg by mouth daily.    . carvedilol (COREG) 25 MG tablet Take 1 tablet (25 mg total) by mouth 2 (two) times daily with a meal. 60 tablet 3  . eplerenone (INSPRA) 25 MG tablet Take 0.5 tablets (12.5 mg total) by mouth daily. 15 tablet 3  . famotidine (PEPCID) 20 MG tablet Take 20 mg by mouth daily as needed for heartburn or indigestion.    . furosemide (LASIX) 40 MG tablet Take 1 tablet (40 mg total) by mouth 2 (two) times daily. 60 tablet  3  . glyBURIDE (DIABETA) 2.5 MG tablet Take 2.5 mg by mouth daily with breakfast.    . losartan (COZAAR) 50 MG tablet Take 1 tablet (50 mg total) by mouth daily. 30 tablet 3  . metoCLOPramide (REGLAN) 10 MG tablet Take 10 mg by mouth 4 (four) times daily.    Marland Kitchen omeprazole (PRILOSEC) 20 MG capsule Take 20 mg by mouth 2 (two) times daily before a meal.    . Polyethyl Glycol-Propyl Glycol (SYSTANE) 0.4-0.3 % SOLN Apply 1 drop to eye 2 (two) times daily as needed (dry eyes).    . polyethylene glycol powder (GLYCOLAX/MIRALAX) powder Take 255 g by mouth 2 (two) times daily. Until daily soft stools  OTC 850 g 0  . potassium chloride SA (K-DUR,KLOR-CON) 20 MEQ tablet Take 1 tablet (20 mEq total) by mouth 2 (two) times daily. 60 tablet 3   No current  facility-administered medications for this encounter.     Allergies  Allergen Reactions  . Shrimp [Shellfish Allergy] Anaphylaxis    History   Social History  . Marital Status: Married    Spouse Name: N/A  . Number of Children: Y  . 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 Smoking status: Never Smoker   . Smokeless tobacco: Never Used  . Alcohol Use: No  . Drug Use: No  . Sexual Activity: Yes    Birth Control/ Protection: Surgical   Other Topics Concern  . Not on file   Social History Narrative   She lives with her husband and son.  She is works for [employer].    Family History  Problem Relation Age of Onset  . Heart disease Maternal Aunt   . Breast cancer Mother   . Hypertension Maternal Grandmother   . Breast cancer Maternal Aunt     PHYSICAL EXAM: Filed Vitals:   10/15/14 1345  BP: 102/62  Pulse: 76    General:  Well appearing. No respiratory difficulty HEENT:  Normal Neck: supple. JVP 6-7 Carotids 2+ bilat; no bruits. No lymphadenopathy. +  nodular goiter Cor: PMI nondisplaced. Regular rate & rhythm. No rubs or murmurs.  Lungs: clear Abdomen: soft, nontender, obese, nondistended. No hepatosplenomegaly. No bruits or masses. Good bowel sounds. Extremities: no cyanosis, clubbing, rash., R and LLE trace edema.  Neuro: alert & oriented x 3, cranial nerves grossly intact. moves all 4 extremities w/o difficulty. Affect pleasant.   ASSESSMENT & PLAN: 1. Chronic systolic HF-NICM    Previous EF 60% 10/2012 but back down -->ECHO 09/03/2014. EF 20-25% Grade II DD. NYHA II-III-Volume status mildly elevated. Can increase inspra if bmet ok.  -Continue lasix to 40 bid with Kcl 20 bid -On goal dose of carvedilol 25 bid.   -Start losartan 50 mg daily  -For now increase 12.5 mg inspra daily. Can increase as above if bmet ok. Intolerant spironoloactone due to breast tenderness.     Reinforced medication compliance, low salt diet, and limiting fluid intake to < 2 liters per day.  Plan to repeat ECHO in 3 months after HF meds optimized again.  2. HTN   --Blood pressure well controlled.  3. ? H/o CAD  --No evidence of ischemia. cMRI without scar. Continue current regimen.  4. OSA  -- Continue  CPAP nightly.  5. Goiter  -- followed by PCP. TSH in 6/15 was ok.   Check BMET. Follow up in 4 weeks.  Izza Bickle, NP-C  2:07 PM

## 2014-10-20 ENCOUNTER — Telehealth (HOSPITAL_COMMUNITY): Payer: Self-pay | Admitting: Vascular Surgery

## 2014-10-20 NOTE — Telephone Encounter (Signed)
Pt aware of lab  Results and voiced undersstanding

## 2014-10-20 NOTE — Telephone Encounter (Signed)
Pt returning a call from [facility] from last week , pt would like to speak to some one for her lab results

## 2014-10-21 MED ORDER — EPLERENONE 25 MG PO TABS: 25.0000 mg | ORAL_TABLET | Freq: Every day | ORAL | Status: DC

## 2014-10-21 NOTE — Telephone Encounter (Signed)
Pt aware and agreeable, will increase med and repeat labs sch for 4/6

## 2014-10-28 ENCOUNTER — Ambulatory Visit (HOSPITAL_COMMUNITY)
Admission: RE | Admit: 2014-10-28 | Discharge: 2014-10-28 | Disposition: A | Discharge status: Home / Self Care | Payer: 59 | Source: Ambulatory Visit | Attending: Cardiology | Admitting: Cardiology

## 2014-10-28 DIAGNOSIS — I5022 Chronic systolic (congestive) heart failure: Secondary | ICD-10-CM | POA: Insufficient documentation

## 2014-10-28 LAB — BASIC METABOLIC PANEL
Anion gap: 7 (ref 5–15)
BUN: 9 mg/dL (ref 6–23)
CO2: 30 mmol/L (ref 19–32)
Calcium: 9.3 mg/dL (ref 8.4–10.5)
Chloride: 101 mmol/L (ref 96–112)
Creatinine, Ser: 0.73 mg/dL (ref 0.50–1.10)
GFR calc Af Amer: >90 mL/min (ref >90)
GFR calc non Af Amer: >90 mL/min (ref >90)
Glucose, Bld: 232 mg/dL — ABNORMAL HIGH (ref 70–99)
POTASSIUM: 4.3 mmol/L (ref 3.5–5.1)
SODIUM: 138 mmol/L (ref 135–145)

## 2014-11-12 ENCOUNTER — Ambulatory Visit (HOSPITAL_COMMUNITY)
Admission: RE | Admit: 2014-11-12 | Discharge: 2014-11-12 | Disposition: A | Discharge status: Home / Self Care | Payer: 59 | Source: Ambulatory Visit | Attending: Internal Medicine | Admitting: Internal Medicine

## 2014-11-12 ENCOUNTER — Encounter (HOSPITAL_COMMUNITY): Payer: Self-pay

## 2014-11-12 VITALS — BP 112/70 | HR 60 | Wt 198.2 lb

## 2014-11-12 DIAGNOSIS — I10 Essential (primary) hypertension: Secondary | ICD-10-CM | POA: Insufficient documentation

## 2014-11-12 DIAGNOSIS — E049 Nontoxic goiter, unspecified: Secondary | ICD-10-CM | POA: Insufficient documentation

## 2014-11-12 DIAGNOSIS — G4733 Obstructive sleep apnea (adult) (pediatric): Secondary | ICD-10-CM

## 2014-11-12 DIAGNOSIS — R0600 Dyspnea, unspecified: Secondary | ICD-10-CM | POA: Diagnosis not present

## 2014-11-12 DIAGNOSIS — E669 Obesity, unspecified: Secondary | ICD-10-CM | POA: Diagnosis not present

## 2014-11-12 DIAGNOSIS — I5022 Chronic systolic (congestive) heart failure: Secondary | ICD-10-CM

## 2014-11-12 DIAGNOSIS — Z7982 Long term (current) use of aspirin: Secondary | ICD-10-CM | POA: Diagnosis not present

## 2014-11-12 DIAGNOSIS — E119 Type 2 diabetes mellitus without complications: Secondary | ICD-10-CM | POA: Insufficient documentation

## 2014-11-12 DIAGNOSIS — Z79899 Other long term (current) drug therapy: Secondary | ICD-10-CM | POA: Insufficient documentation

## 2014-11-12 DIAGNOSIS — K219 Gastro-esophageal reflux disease without esophagitis: Secondary | ICD-10-CM | POA: Diagnosis not present

## 2014-11-12 MED ORDER — SACUBITRIL-VALSARTAN 49-51 MG PO TABS: 1.0000 | ORAL_TABLET | Freq: Two times a day (BID) | ORAL | Status: DC

## 2014-11-12 NOTE — Progress Notes (Signed)
Patient ID: Susan Peck, female   DOB: 07/26/1965, 49 y.o.   MRN: [id number]  PCP: Dr Ayesha Rumpf GYN: Dr Carolin Guernsey Pulmonologist: Dr Gwenette Greet  HPI:  Susan Peck is a 49 year old African American female with systolic heart failure, HTN, GERD, 11/23/11 S/P Hysterectomy, multinodular goiter and asthma.   2004 cardiac cath by Dr Lynnell Jude in [place], [place] with one blockage noted - no stent. EF said to be normal. About 1 year ago at [facility] noted murmur and she was referred to Dr Tamala Julian EF 25-30%.  She is here for HF follow up. Last visit losartan started. Complains of fatigue. Denies SOB/PND/Orthopnea. Does admit to dyspnea with inclines. Having leg and back pain. Weight at home 196 pounds. Not weighing daily. Not exercising. Using CPAP. Tries to take all medications.  Does not drink alcohol or smoke.   05/19/2011  Sleep study revealed moderate obstructive sleep apnea with desaturations noted.   04/03/2011  ECHO EF 25-30% 06/26/2012 ECHO EF 30-35%  02/29/2012 ECHO EF 35-40%  11/18/12 ECHO EF 55% Inferior wall mildly hypokinetic with septal HK Myoview 02/03/10 EF 42% No ischemia Septal and apical hypokinesis 11/14 Cardiac MRI  EF 52%- septal bounce suggestive of LBBB. I suspect that the dyssynchrony from LBBB leads to the low calculated EF. Normal RV size and systolic function. No myocardial delayed enhancement, so no definite evidence for prior myocardial infarction, myocarditis, or infiltrative disease. 1/16 CT chest - small pleural effusions with early pulmonary edema. No ILD. 09/03/2014: ECHO EF 20-25%   Labs 08/19/14: K 4.1 Creatinine 0.62   Labs 10/28/2014: K 4.3 Creatinine 0.73   ROS: All other systems normal except as mentioned in HPI, past medical history and problem list.    Past Medical History  Diagnosis Date  . Systolic heart failure     May 2011 EF 35-40%, 04/03/11 EF 25-30%, 06/27/11 EF 30-35%  . Hypertension   . Asthma   . Obesity   . GERD (gastroesophageal reflux disease)   . Sleep  apnea   . Heart murmur     dx 2 yrs ago per pt  . Seasonal allergies   . Shortness of breath     occasional - exercise induced  . Diabetes mellitus     recent dx 11/17/11 - started med 11/18/11  . Goiter 07/27/2011    non-neoplastic goiter - fine needle aspiration - benign  . Low back pain     history  . Anxiety     no meds  . Depression     no meds  . IBS (irritable bowel syndrome)     tx with diet per pt  . Herpes genitalis in women     Current Outpatient Prescriptions  Medication Sig Dispense Refill  . albuterol (PROVENTIL HFA;VENTOLIN HFA) 108 (90 BASE) MCG/ACT inhaler Inhale 1-2 puffs into the lungs every 6 (six) hours as needed for wheezing or shortness of breath. 1 Inhaler 0  . aspirin 81 MG chewable tablet Chew 81 mg by mouth daily.    . carvedilol (COREG) 25 MG tablet Take 1 tablet (25 mg total) by mouth 2 (two) times daily with a meal. 60 tablet 3  . eplerenone (INSPRA) 25 MG tablet Take 1 tablet (25 mg total) by mouth daily. 15 tablet 3  . famotidine (PEPCID) 20 MG tablet Take 20 mg by mouth daily as needed for heartburn or indigestion.    . furosemide (LASIX) 40 MG tablet Take 1 tablet (40 mg total) by mouth 2 (two) times  daily. 60 tablet 3  . losartan (COZAAR) 50 MG tablet Take 1 tablet (50 mg total) by mouth daily. 30 tablet 3  . metoCLOPramide (REGLAN) 10 MG tablet Take 10 mg by mouth 4 (four) times daily.    Marland Kitchen omeprazole (PRILOSEC) 20 MG capsule Take 20 mg by mouth 2 (two) times daily before a meal.    . Polyethyl Glycol-Propyl Glycol (SYSTANE) 0.4-0.3 % SOLN Apply 1 drop to eye 2 (two) times daily as needed (dry eyes).    . polyethylene glycol powder (GLYCOLAX/MIRALAX) powder Take 255 g by mouth 2 (two) times daily. Until daily soft stools  OTC 850 g 0  . potassium chloride SA (K-DUR,KLOR-CON) 20 MEQ tablet Take 1 tablet (20 mEq total) by mouth 2 (two) times daily. 60 tablet 3   No current facility-administered medications for this encounter.     Allergies   Allergen Reactions  . Shrimp [Shellfish Allergy] Anaphylaxis    History   Social History  . Marital Status: Married    Spouse Name: N/A  . Number of Children: Y  . 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 Smoking status: Never Smoker   . Smokeless tobacco: Never Used  . Alcohol Use: No  . Drug Use: No  . Sexual Activity: Yes    Birth Control/ Protection: Surgical   Other Topics Concern  . Not on file   Social History Narrative   She lives with her husband and son.  She is works for [employer].    Family History  Problem Relation Age of Onset  . Heart disease Maternal Aunt   . Breast cancer Mother   . Hypertension Maternal Grandmother   . Breast cancer Maternal Aunt     PHYSICAL EXAM: Filed Vitals:   11/12/14 1404  BP: 112/70  Pulse: 60  Weight 198 pounds.   General:  Well appearing. No respiratory difficulty HEENT:  Normal Neck: supple. JVP 6-7 Carotids 2+ bilat; no bruits. No lymphadenopathy. +  nodular goiter Cor: PMI nondisplaced. Regular rate & rhythm. No rubs or murmurs.  Lungs: clear Abdomen: soft, nontender, obese, nondistended. No hepatosplenomegaly. No bruits or masses. Good bowel sounds. Extremities: no cyanosis, clubbing, rash., R and LLE trace edema.  Neuro: alert & oriented x 3, cranial nerves grossly intact. moves all 4 extremities w/o difficulty. Affect pleasant.   ASSESSMENT & PLAN: 1. Chronic systolic HF-NICM    Previous EF 60% 10/2012 but back down -->ECHO 09/03/2014. EF 20-25% Grade II DD. NYHA II-III-Volume status stable.  -Continue lasix to 40 bid. Stop Potassium using lots of Mrs Deliah Boston -On goal dose of carvedilol 25 bid.   -Stop losartan and switch to entresto 49-51 twice a day. Will refer to [facility]   -Continue 25 mg inspra daily.   Reinforced medication compliance, low salt diet, and limiting fluid intake to < 2 liters per day.  Plan to  repeat ECHO in 3 months after HF meds optimized again.  2. HTN   --Blood pressure well controlled.  3. ? H/o CAD  --No evidence of ischemia. cMRI without scar. Continue current regimen.  4. OSA  -- Continue  CPAP nightly.  5. Goiter  -- followed by PCP. TSH in 6/15 was ok.   Check BMET 10 days. Check CPX. . Follow up in 4 weeks.  Refer to paramedicine Bibiana Gillean, NP-C  2:12 PM

## 2014-11-12 NOTE — Patient Instructions (Addendum)
STOP Losartan.  STOP Potassium.  START Entresto 49-51 tablet twice daily.  Return next week for lab work and your exercise test. (Cardiopulmonary Exercise Test). Wear comfortable clothes and shoes. Eat a light lunch/snack before the test. No caffeine or alcohol for 24 hrs before the test.  Follow up 4 weeks.  Do the following things EVERYDAY: 1) Weigh yourself in the morning before breakfast. Write it down and keep it in a log. 2) Take your medicines as prescribed 3) Eat low salt foods-Limit salt (sodium) to 2000 mg per day.  4) Stay as active as you can everyday 5) Limit all fluids for the day to less than 2 liters

## 2014-11-13 ENCOUNTER — Other Ambulatory Visit (HOSPITAL_COMMUNITY): Payer: Self-pay | Admitting: *Deleted

## 2014-11-13 NOTE — Telephone Encounter (Signed)
entresto approved by united [employer]. Faxed to pt pharmacy.

## 2014-11-16 ENCOUNTER — Telehealth: Payer: Self-pay | Admitting: Licensed Clinical Social Worker

## 2014-11-16 NOTE — Telephone Encounter (Signed)
CSW referred to assist with medicaid application. CSW contacted patient by phone who reports she has a pending disability application with Social Security. Patient stated she was told not to bother to make application for medicaid as she has to wait for Social Security determination. Patient has assistance with disability application and will wait for determination before proceeding with medicaid. CSW offered support and assistance with medicaid application if needed. Patient verbalizes understanding and will call CSW if needed. Susan Peck, [place]

## 2014-11-18 ENCOUNTER — Other Ambulatory Visit (HOSPITAL_COMMUNITY): Payer: 59

## 2014-11-19 ENCOUNTER — Ambulatory Visit (HOSPITAL_COMMUNITY)
Admission: RE | Admit: 2014-11-19 | Discharge: 2014-11-19 | Disposition: A | Discharge status: Home / Self Care | Payer: 59 | Source: Ambulatory Visit | Attending: Internal Medicine | Admitting: Internal Medicine

## 2014-11-19 ENCOUNTER — Ambulatory Visit (HOSPITAL_COMMUNITY): Payer: 59

## 2014-11-19 DIAGNOSIS — I5022 Chronic systolic (congestive) heart failure: Secondary | ICD-10-CM | POA: Diagnosis present

## 2014-11-19 DIAGNOSIS — R0600 Dyspnea, unspecified: Secondary | ICD-10-CM

## 2014-11-19 LAB — BASIC METABOLIC PANEL
Anion gap: 7 (ref 5–15)
BUN: 10 mg/dL (ref 6–23)
CALCIUM: 9.5 mg/dL (ref 8.4–10.5)
CO2: 30 mmol/L (ref 19–32)
Chloride: 101 mmol/L (ref 96–112)
Creatinine, Ser: 0.64 mg/dL (ref 0.50–1.10)
Glucose, Bld: 96 mg/dL (ref 70–99)
POTASSIUM: 3.6 mmol/L (ref 3.5–5.1)
Sodium: 138 mmol/L (ref 135–145)

## 2014-11-27 ENCOUNTER — Telehealth (HOSPITAL_COMMUNITY): Payer: Self-pay | Admitting: *Deleted

## 2014-11-27 NOTE — Telephone Encounter (Signed)
Received confirmation that pt's Susan Peck is approved 11/13/14-11/13/15, [id number]

## 2014-11-30 ENCOUNTER — Encounter (HOSPITAL_COMMUNITY): Payer: Self-pay

## 2014-11-30 NOTE — Progress Notes (Signed)
Medical record request faxed to our office  For disbility through social security from [place] for all available records in 2016. Case # [id number] All records faxed successfully to fax # provided [phone] Copy of record request faxed into patient's chart for future reference.  [name]

## 2014-12-02 ENCOUNTER — Telehealth: Payer: Self-pay | Admitting: Licensed Clinical Social Worker

## 2014-12-03 NOTE — Telephone Encounter (Signed)
CSW returned call to patient who stated inability to pay insurance premium and mortgage due to no income. Patient has health insurance through [employer] and CSW encouraged her to return call to inquire about reduced premium due to change in income source since original determination of premium was based on previous income source. CSW also encouraged patient to return call to [place] to inquire about options for reduced payments or refinancing. Patient verbalized understanding of options and will make follow up calls and return call to CSW if further assistance is needed. Raquel Sarna, [place]

## 2014-12-09 ENCOUNTER — Telehealth (HOSPITAL_COMMUNITY): Payer: Self-pay

## 2014-12-09 NOTE — Telephone Encounter (Signed)
Patient called asking why she had an appointment tomorrow.  Left voicemail for patient that she was due for a 1 month follow up after having some medications changes at her last appointment to see how she was doing on new medication regimen.  Advised to call us back with further questions or if there was a need to cancel/reschedule this appointment.  [name]

## 2014-12-10 ENCOUNTER — Encounter (HOSPITAL_COMMUNITY): Payer: Self-pay | Admitting: Adult Health

## 2014-12-10 ENCOUNTER — Encounter (HOSPITAL_COMMUNITY): Payer: Self-pay

## 2014-12-10 ENCOUNTER — Ambulatory Visit (HOSPITAL_COMMUNITY)
Admission: RE | Admit: 2014-12-10 | Discharge: 2014-12-10 | Disposition: A | Discharge status: Home / Self Care | Payer: 59 | Source: Ambulatory Visit | Attending: Internal Medicine | Admitting: Internal Medicine

## 2014-12-10 VITALS — BP 109/76 | HR 66 | Resp 18 | Wt 199.8 lb

## 2014-12-10 DIAGNOSIS — I5022 Chronic systolic (congestive) heart failure: Secondary | ICD-10-CM

## 2014-12-10 DIAGNOSIS — R0789 Other chest pain: Secondary | ICD-10-CM | POA: Diagnosis not present

## 2014-12-10 DIAGNOSIS — R002 Palpitations: Secondary | ICD-10-CM | POA: Diagnosis not present

## 2014-12-10 DIAGNOSIS — I159 Secondary hypertension, unspecified: Secondary | ICD-10-CM

## 2014-12-10 DIAGNOSIS — E049 Nontoxic goiter, unspecified: Secondary | ICD-10-CM

## 2014-12-10 DIAGNOSIS — G4733 Obstructive sleep apnea (adult) (pediatric): Secondary | ICD-10-CM

## 2014-12-10 LAB — TSH: TSH: 1.347 u[IU]/mL (ref 0.350–4.500)

## 2014-12-10 MED ORDER — DIGOXIN 125 MCG PO TABS: 0.1250 mg | ORAL_TABLET | Freq: Every day | ORAL | Status: DC

## 2014-12-10 NOTE — Patient Instructions (Signed)
START Digoxin 0.125mg  (1 tablet) once daily.  Will set you up for a lexiscan (drug-induced) stress test and 30 day cardiac event monitor at [facility]. [place] [facility] [place]. [address] [place], [place] [address] Main number: [phone]  Basic Instructions for your stress test... 1.  Avoid alcohol, tobacco, and caffeine products 12 hours before test. 2.  Eat a LIGHT meal/snack before test.  Avoid heavy meals. 3.  This is a drug-induced test and will NOT require exercise. 4.  Take all medications as usual.  Follow up 4 weeks with echocardiogram.  Do the following things EVERYDAY: 1) Weigh yourself in the morning before breakfast. Write it down and keep it in a log. 2) Take your medicines as prescribed 3) Eat low salt foods-Limit salt (sodium) to 2000 mg per day.  4) Stay as active as you can everyday 5) Limit all fluids for the day to less than 2 liters

## 2014-12-10 NOTE — Progress Notes (Signed)
Patient ID: KALEAH HAGEMEISTER, female   DOB: 1965-10-16, 49 y.o.   MRN: [id number]  PCP: Dr Ayesha Rumpf GYN: Dr Carolin Guernsey Pulmonologist: Dr Gwenette Greet  HPI:  Clara is a 49 year old African American female with systolic heart failure, HTN, GERD, 11/23/11 S/P Hysterectomy, multinodular goiter and asthma.   2004 cardiac cath by Dr Lynnell Jude in [place], [place] with one blockage noted - no stent. EF said to be normal. About 1 year ago at [facility] noted murmur and she was referred to Dr Tamala Julian EF 25-30%.  She is here for HF follow up. Last visit losartan stopped and she started on entresto. Having palpitations. Mild dyspnea with exertion. SOB with inclines. Denies PND/Orthopnea. Had chest pain on Saturday and took an aspirin. Chest pain resolved. Weight at home 199 pounds.  Not exercising. Using CPAP. Tries to take all medications.  Does not drink alcohol or smoke. Now followed by paramedicine. Not working.   05/19/2011  Sleep study revealed moderate obstructive sleep apnea with desaturations noted.   04/03/2011  ECHO EF 25-30% 06/26/2012 ECHO EF 30-35%  02/29/2012 ECHO EF 35-40%  11/18/12 ECHO EF 55% Inferior wall mildly hypokinetic with septal HK Myoview 02/03/10 EF 42% No ischemia Septal and apical hypokinesis 11/14 Cardiac MRI  EF 52%- septal bounce suggestive of LBBB. Dyssynchrony from LBBB leads to the low calculated EF. Normal RV size and systolic function. No myocardial delayed enhancement, so no definite evidence for prior myocardial infarction, myocarditis, or infiltrative disease. 1/16 CT chest - small pleural effusions with early pulmonary edema. No ILD. 09/03/2014: ECHO EF 20-25%   CPX 11/20/2014  Peak VO2: 16.1 (76.8% predicted peak VO2) VE/VCO2 slope: 28.5 OUES: 1.59 Peak RER: 1.18  Labs 08/19/14: K 4.1 Creatinine 0.62   Labs 10/28/2014: K 4.3 Creatinine 0.73  Labs 11/19/2014: K 3.6 Creatinine 0.64   ROS: All other systems normal except as mentioned in HPI, past medical history and problem list.     Past Medical History  Diagnosis Date  . Systolic heart failure     May 2011 EF 35-40%, 04/03/11 EF 25-30%, 06/27/11 EF 30-35%  . Hypertension   . Asthma   . Obesity   . GERD (gastroesophageal reflux disease)   . Sleep apnea   . Heart murmur     dx 2 yrs ago per pt  . Seasonal allergies   . Shortness of breath     occasional - exercise induced  . Diabetes mellitus     recent dx 11/17/11 - started med 11/18/11  . Goiter 07/27/2011    non-neoplastic goiter - fine needle aspiration - benign  . Low back pain     history  . Anxiety     no meds  . Depression     no meds  . IBS (irritable bowel syndrome)     tx with diet per pt  . Herpes genitalis in women     Current Outpatient Prescriptions  Medication Sig Dispense Refill  . albuterol (PROVENTIL HFA;VENTOLIN HFA) 108 (90 BASE) MCG/ACT inhaler Inhale 1-2 puffs into the lungs every 6 (six) hours as needed for wheezing or shortness of breath. 1 Inhaler 0  . aspirin 81 MG chewable tablet Chew 81 mg by mouth daily.    . carvedilol (COREG) 25 MG tablet Take 1 tablet (25 mg total) by mouth 2 (two) times daily with a meal. 60 tablet 3  . famotidine (PEPCID) 20 MG tablet Take 20 mg by mouth daily as needed for heartburn or indigestion.    Marland Kitchen  furosemide (LASIX) 40 MG tablet Take 1 tablet (40 mg total) by mouth 2 (two) times daily. 60 tablet 3  . glimepiride (AMARYL) 2 MG tablet Take 2 mg by mouth daily with breakfast.    . metFORMIN (GLUCOPHAGE) 500 MG tablet Take 500 mg by mouth 2 (two) times daily with a meal.    . metoCLOPramide (REGLAN) 10 MG tablet Take 10 mg by mouth 4 (four) times daily.    Marland Kitchen omeprazole (PRILOSEC) 20 MG capsule Take 20 mg by mouth 2 (two) times daily before a meal.    . Polyethyl Glycol-Propyl Glycol (SYSTANE) 0.4-0.3 % SOLN Apply 1 drop to eye 2 (two) times daily as needed (dry eyes).    . polyethylene glycol powder (GLYCOLAX/MIRALAX) powder Take 255 g by mouth 2 (two) times daily. Until daily soft stools  OTC 850 g  0  . sacubitril-valsartan (ENTRESTO) 49-51 MG Take 1 tablet by mouth 2 (two) times daily. 60 tablet 3   No current facility-administered medications for this encounter.     Allergies  Allergen Reactions  . Shrimp [Shellfish Allergy] Anaphylaxis    History   Social History  . Marital Status: Married    Spouse Name: N/A  . Number of Children: Y  . 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 Smoking status: Never Smoker   . Smokeless tobacco: Never Used  . Alcohol Use: No  . Drug Use: No  . Sexual Activity: Yes    Birth Control/ Protection: Surgical   Other Topics Concern  . Not on file   Social History Narrative   She lives with her husband and son.  She is works for [employer].    Family History  Problem Relation Age of Onset  . Heart disease Maternal Aunt   . Breast cancer Mother   . Hypertension Maternal Grandmother   . Breast cancer Maternal Aunt     PHYSICAL EXAM: Filed Vitals:   12/10/14 1356  BP: 109/76  Pulse: 66  Resp: 18  Weight 199 pounds.   General:  Well appearing. No respiratory difficulty HEENT:  Normal Neck: supple. JVP 6-7 Carotids 2+ bilat; no bruits. No lymphadenopathy. +  nodular goiter Cor: PMI nondisplaced. Regular rate & rhythm. No rubs or murmurs.  Lungs: clear Abdomen: soft, nontender, obese, nondistended. No hepatosplenomegaly. No bruits or masses. Good bowel sounds. Extremities: no cyanosis, clubbing, rash., R and LLE trace edema.  Neuro: alert & oriented x 3, cranial nerves grossly intact. moves all 4 extremities w/o difficulty. Affect pleasant.   ASSESSMENT & PLAN: 1. Chronic systolic HF-NICM    Previous EF 60% 10/2012 but back down -->ECHO 09/03/2014. EF 20-25% Grade II DD.  NYHA II-III-Volume status stable. -Continue lasix to 40 bid.  -On goal dose of carvedilol 25 bid. Add digoxin 0.125 mg daily   -Continue entresto 49-51 twice a day.  Referred to [facility]-   -Continue 25 mg inspra daily.   Reinforced medication compliance, low salt diet, and limiting fluid intake to < 2 liters per day.  Plan to repeat ECHO next visit .   2. HTN   --Blood pressure well controlled.  3. ? H/O CAD  --No evidence of ischemia. cMRI without scar. Continue current regimen.  4. OSA  -- Continue  CPAP nightly.  5. Goiter  -- followed by PCP. Check. TSH T3 T4 6. Chest Tightness- Check stress test.  7.  Palpitations- Place 30 day event monitor   Follow up in 4 weeks.  Continue paramedicine  Sherin Murdoch, NP-C  2:06 PM

## 2014-12-11 LAB — T4: T4 TOTAL: 7.6 ug/dL (ref 4.5–12.0)

## 2014-12-11 LAB — T3: T3, Total: 106 ng/dL (ref 71–180)

## 2014-12-16 ENCOUNTER — Encounter: Payer: Self-pay | Admitting: Licensed Clinical Social Worker

## 2014-12-16 ENCOUNTER — Ambulatory Visit (INDEPENDENT_AMBULATORY_CARE_PROVIDER_SITE_OTHER): Payer: 59

## 2014-12-16 DIAGNOSIS — R002 Palpitations: Secondary | ICD-10-CM

## 2014-12-16 NOTE — Progress Notes (Signed)
CSW met with patient who requested assistance with completion of disability paperwork. Patient reports she has a pending SSD application but also needs to apply for Medicaid and [employer]. CSW provided assistance and drop off locations for applications. CSW will continue to be avilable as needed. Raquel Sarna, [place]

## 2014-12-23 ENCOUNTER — Telehealth (HOSPITAL_COMMUNITY): Payer: Self-pay

## 2014-12-23 ENCOUNTER — Encounter (HOSPITAL_COMMUNITY): Payer: Self-pay

## 2014-12-23 NOTE — Telephone Encounter (Signed)
Patient given detailed instructions per Myocardial Perfusion Study Information Sheet for test on 12-24-2014 at 7:15am. Patient verbalized understanding. Oletta Lamas, Deari Sessler A

## 2014-12-23 NOTE — Progress Notes (Signed)
[facility] faxed request for update in patient's records. Case # [id number] Requesting latest stress test 11/20/14 Faxed to provided # [phone]  [name]

## 2014-12-24 ENCOUNTER — Ambulatory Visit (HOSPITAL_COMMUNITY): Payer: 59 | Attending: Cardiovascular Disease

## 2014-12-24 VITALS — Ht 64.0 in | Wt 199.0 lb

## 2014-12-24 DIAGNOSIS — G4440 Drug-induced headache, not elsewhere classified, not intractable: Secondary | ICD-10-CM

## 2014-12-24 DIAGNOSIS — I5022 Chronic systolic (congestive) heart failure: Secondary | ICD-10-CM | POA: Diagnosis not present

## 2014-12-24 DIAGNOSIS — R0789 Other chest pain: Secondary | ICD-10-CM

## 2014-12-24 DIAGNOSIS — R0602 Shortness of breath: Secondary | ICD-10-CM

## 2014-12-24 LAB — MYOCARDIAL PERFUSION IMAGING
CHL CUP NUCLEAR SDS: 3
CHL CUP NUCLEAR SSS: 5
CHL CUP RESTING HR STRESS: 67 {beats}/min
CHL CUP STRESS STAGE 1 GRADE: 0 %
CHL CUP STRESS STAGE 1 HR: 67 {beats}/min
CHL CUP STRESS STAGE 1 SPEED: 0 mph
CHL CUP STRESS STAGE 3 HR: 89 {beats}/min
CHL CUP STRESS STAGE 3 SPEED: 0 mph
CHL CUP STRESS STAGE 5 SPEED: 0 mph
CSEPEW: 1 METS
CSEPPHR: 90 {beats}/min
LHR: 0.4
LV sys vol: 104 mL
LVDIAVOL: 161 mL
NUC STRESS EF: 36 %
NUC STRESS TID: 0.96
Percent of predicted max HR: 52 %
SRS: 4
Stage 2 Grade: 0 %
Stage 2 HR: 67 {beats}/min
Stage 2 Speed: 0 mph
Stage 3 Grade: 0 %
Stage 4 Grade: 0 %
Stage 4 HR: 90 {beats}/min
Stage 4 Speed: 0 mph
Stage 5 Grade: 0 %
Stage 5 HR: 86 {beats}/min
Stage 6 Grade: 0 %
Stage 6 HR: 69 {beats}/min
Stage 6 Speed: 0 mph

## 2014-12-24 MED ORDER — TECHNETIUM TC 99M SESTAMIBI GENERIC - CARDIOLITE
11.0000 | Freq: Once | INTRAVENOUS | Status: AC | PRN
  Administered 2014-12-24: 11 via INTRAVENOUS

## 2014-12-24 MED ORDER — AMINOPHYLLINE 25 MG/ML IV SOLN
150.0000 mg | Freq: Two times a day (BID) | INTRAVENOUS | Status: DC | PRN
  Administered 2014-12-24: 150 mg via INTRAVENOUS

## 2014-12-24 MED ORDER — TECHNETIUM TC 99M SESTAMIBI GENERIC - CARDIOLITE
33.0000 | Freq: Once | INTRAVENOUS | Status: AC | PRN
  Administered 2014-12-24: 33 via INTRAVENOUS

## 2014-12-24 MED ORDER — REGADENOSON 0.4 MG/5ML IV SOLN
0.4000 mg | Freq: Once | INTRAVENOUS | Status: AC
  Administered 2014-12-24: 0.4 mg via INTRAVENOUS

## 2014-12-25 ENCOUNTER — Other Ambulatory Visit (HOSPITAL_COMMUNITY): Payer: Self-pay | Admitting: Family Medicine

## 2014-12-25 DIAGNOSIS — Z1231 Encounter for screening mammogram for malignant neoplasm of breast: Secondary | ICD-10-CM

## 2014-12-27 ENCOUNTER — Other Ambulatory Visit (HOSPITAL_COMMUNITY): Payer: Self-pay | Admitting: Internal Medicine

## 2014-12-28 ENCOUNTER — Telehealth (HOSPITAL_COMMUNITY): Payer: Self-pay | Admitting: Vascular Surgery

## 2014-12-28 DIAGNOSIS — I5022 Chronic systolic (congestive) heart failure: Secondary | ICD-10-CM

## 2014-12-28 NOTE — Telephone Encounter (Signed)
Pt has no more refills on her Carvedilol she is completely out she states she needs them ASAP

## 2014-12-29 MED ORDER — CARVEDILOL 25 MG PO TABS: 25.0000 mg | ORAL_TABLET | Freq: Two times a day (BID) | ORAL | Status: DC

## 2014-12-29 NOTE — Telephone Encounter (Signed)
In the future pt should have the pharmacy contact us for refills

## 2014-12-30 ENCOUNTER — Ambulatory Visit (HOSPITAL_COMMUNITY)
Admission: RE | Admit: 2014-12-30 | Discharge: 2014-12-30 | Disposition: A | Discharge status: Home / Self Care | Payer: 59 | Source: Ambulatory Visit | Attending: Family Medicine | Admitting: Family Medicine

## 2014-12-30 ENCOUNTER — Encounter (HOSPITAL_COMMUNITY): Payer: Self-pay

## 2014-12-30 DIAGNOSIS — Z1231 Encounter for screening mammogram for malignant neoplasm of breast: Secondary | ICD-10-CM | POA: Diagnosis present

## 2014-12-30 NOTE — Progress Notes (Signed)
Addition request from [place] faxed to [facility] for additional medical record updates. All records from our office 10/15/2014 - present faxed to provided # [phone] Case # [id number] Copy of request scanned into electronic medical records for future reference.  [name]

## 2015-01-05 ENCOUNTER — Telehealth: Payer: Self-pay

## 2015-01-05 NOTE — Telephone Encounter (Signed)
Patient called inquiring when her last pap was  ---is she due a pap. I told her last CE 04/15/2013 and she is overdue for annual. Last pap 2012 so due a pap smear as well.  Patient has already called and scheduled CE for July 17 with Dr. Moshe Salisbury.  She mentioned concerns regarding vag inf. I told her she could come in for RG visit and keep CE as scheduled since that is soonest for annual exam. She was transferred to [facility] to schedule.

## 2015-01-06 ENCOUNTER — Encounter: Payer: Self-pay | Admitting: Women's Health

## 2015-01-06 ENCOUNTER — Telehealth (HOSPITAL_COMMUNITY): Payer: Self-pay | Admitting: Vascular Surgery

## 2015-01-06 ENCOUNTER — Ambulatory Visit (INDEPENDENT_AMBULATORY_CARE_PROVIDER_SITE_OTHER): Payer: 59 | Admitting: Women's Health

## 2015-01-06 DIAGNOSIS — I5022 Chronic systolic (congestive) heart failure: Secondary | ICD-10-CM

## 2015-01-06 DIAGNOSIS — B3731 Acute candidiasis of vulva and vagina: Secondary | ICD-10-CM

## 2015-01-06 DIAGNOSIS — R350 Frequency of micturition: Secondary | ICD-10-CM | POA: Diagnosis not present

## 2015-01-06 DIAGNOSIS — B373 Candidiasis of vulva and vagina: Secondary | ICD-10-CM

## 2015-01-06 LAB — URINALYSIS W MICROSCOPIC + REFLEX CULTURE
Bilirubin Urine: NEGATIVE
CASTS: NONE SEEN
Crystals: NONE SEEN
GLUCOSE, UA: NEGATIVE mg/dL
Ketones, ur: NEGATIVE mg/dL
Nitrite: NEGATIVE
PH: 5.5 (ref 5.0–8.0)
PROTEIN: 30 mg/dL — AB
Specific Gravity, Urine: 1.025 (ref 1.005–1.030)
Urobilinogen, UA: 1 mg/dL (ref 0.0–1.0)

## 2015-01-06 LAB — WET PREP FOR TRICH, YEAST, CLUE
CLUE CELLS WET PREP: NONE SEEN
Trich, Wet Prep: NONE SEEN

## 2015-01-06 MED ORDER — TERCONAZOLE 0.8 % VA CREA: 1.0000 | TOPICAL_CREAM | Freq: Every day | VAGINAL | Status: DC

## 2015-01-06 MED ORDER — FUROSEMIDE 40 MG PO TABS: 40.0000 mg | ORAL_TABLET | Freq: Two times a day (BID) | ORAL | Status: DC

## 2015-01-06 NOTE — Telephone Encounter (Signed)
Pt need refill Furosemide 

## 2015-01-06 NOTE — Patient Instructions (Signed)

## 2015-01-06 NOTE — Progress Notes (Signed)
Patient ID: Susan Peck, female   DOB: 25-Nov-1965, 49 y.o.   MRN: [id number] Presents with several issues, low right sided back pain, vaginal discomfort/unusual feeling, urinary fullness. Reports minimal discharge, denies pain, burning, frequency with urination, abdominal pain or fever. Has numerous health problems on numerous medications, CHF Lasix, has limited fluid allowed once daily, diabetes, hypertension, obesity.  Exam: Appears older than stated years. No CVAT. Abdomen obese without rebound or radiation of pain. External genitalia erythematous at introitus, speculum exam moderate amount of a white discharge with erythema on vaginal walls. Wet prep positive for yeast. I'm annual no adnexal tenderness or CMT. UA: Small leukocytes, many squamous epithelials, 21-50 WBCs, few bacteria.  Yeast vaginitis Questionable UTI  Plan: Urine culture pending we'll triage based on results. Terazol 3 one applicator at bedtime 3, with refill. Call if no relief of symptoms. Reviewed importance of increasing regular exercise as able, healthy diet. Avoid juice, sweet tea and increase plain water.

## 2015-01-06 NOTE — Addendum Note (Signed)
Addended by: Burnett Kanaris on: 01/06/2015 02:18 PM   Modules accepted: Orders

## 2015-01-07 ENCOUNTER — Encounter (HOSPITAL_COMMUNITY): Payer: Self-pay

## 2015-01-07 ENCOUNTER — Telehealth (HOSPITAL_COMMUNITY): Payer: Self-pay | Admitting: *Deleted

## 2015-01-07 ENCOUNTER — Ambulatory Visit (HOSPITAL_COMMUNITY)
Admission: RE | Admit: 2015-01-07 | Discharge: 2015-01-07 | Disposition: A | Discharge status: Home / Self Care | Payer: 59 | Source: Ambulatory Visit | Attending: Internal Medicine | Admitting: Internal Medicine

## 2015-01-07 ENCOUNTER — Ambulatory Visit (HOSPITAL_BASED_OUTPATIENT_CLINIC_OR_DEPARTMENT_OTHER)
Admission: RE | Admit: 2015-01-07 | Discharge: 2015-01-07 | Disposition: A | Discharge status: Home / Self Care | Payer: 59 | Source: Ambulatory Visit | Attending: Internal Medicine | Admitting: Internal Medicine

## 2015-01-07 VITALS — BP 112/74 | HR 69 | Wt 194.8 lb

## 2015-01-07 DIAGNOSIS — G4733 Obstructive sleep apnea (adult) (pediatric): Secondary | ICD-10-CM

## 2015-01-07 DIAGNOSIS — I509 Heart failure, unspecified: Secondary | ICD-10-CM | POA: Insufficient documentation

## 2015-01-07 DIAGNOSIS — I159 Secondary hypertension, unspecified: Secondary | ICD-10-CM | POA: Diagnosis not present

## 2015-01-07 DIAGNOSIS — I5022 Chronic systolic (congestive) heart failure: Secondary | ICD-10-CM

## 2015-01-07 DIAGNOSIS — R002 Palpitations: Secondary | ICD-10-CM

## 2015-01-07 LAB — DIGOXIN LEVEL: DIGOXIN LVL: 0.5 ng/mL — AB (ref 0.8–2.0)

## 2015-01-07 NOTE — Progress Notes (Signed)
  Echocardiogram 2D Echocardiogram has been performed.  [name] 01/07/2015, 11:13 AM

## 2015-01-07 NOTE — Telephone Encounter (Signed)
Pt given echo results 

## 2015-01-07 NOTE — Progress Notes (Signed)
Patient ID: LASHONNE SHULL, female   DOB: 11-15-1965, 49 y.o.   MRN: [id number]  PCP: Dr Ayesha Rumpf GYN: Dr Carolin Guernsey Pulmonologist: Dr Gwenette Greet  HPI:  Raghad is a 49 year old African American female with systolic heart failure, HTN, GERD, 11/23/11 S/P Hysterectomy, multinodular goiter and asthma.   2004 cardiac cath by Dr Lynnell Jude in [place], [place] with one blockage noted - no stent. EF said to be normal. About 1 year ago at [facility] noted murmur and she was referred to Dr Tamala Julian EF 25-30%.  She is here for HF follow up. Last visit she reported frequent palpitations. Event monitor placed . Digoxin added last visit. Mild dyspnea with exertion. Weight at home 194-197 pounds. Tries to walk 20 minutes a few days a week.  Using CPAP. Tries to take all medications.  Does not drink alcohol or smoke. Now followed by paramedicine. Not working.   05/19/2011  Sleep study revealed moderate obstructive sleep apnea with desaturations noted.   04/03/2011  ECHO EF 25-30% 06/26/2012 ECHO EF 30-35%  02/29/2012 ECHO EF 35-40%  11/18/12 ECHO EF 55% Inferior wall mildly hypokinetic with septal HK Myoview 02/03/10 EF 42% No ischemia Septal and apical hypokinesis 11/14 Cardiac MRI  EF 52%- septal bounce suggestive of LBBB. Dyssynchrony from LBBB leads to the low calculated EF. Normal RV size and systolic function. No myocardial delayed enhancement, so no definite evidence for prior myocardial infarction, myocarditis, or infiltrative disease. 1/16 CT chest - small pleural effusions with early pulmonary edema. No ILD. 09/03/2014: ECHO EF 20-25%   CPX 11/20/2014  Peak VO2: 16.1 (76.8% predicted peak VO2) VE/VCO2 slope: 28.5 OUES: 1.59 Peak RER: 1.18  Labs 08/19/14: K 4.1 Creatinine 0.62   Labs 10/28/2014: K 4.3 Creatinine 0.73  Labs 11/19/2014: K 3.6 Creatinine 0.64   ROS: All other systems normal except as mentioned in HPI, past medical history and problem list.    Past Medical History  Diagnosis Date  . Systolic  heart failure     May 2011 EF 35-40%, 04/03/11 EF 25-30%, 06/27/11 EF 30-35%  . Hypertension   . Asthma   . Obesity   . GERD (gastroesophageal reflux disease)   . Sleep apnea   . Heart murmur     dx 2 yrs ago per pt  . Seasonal allergies   . Shortness of breath     occasional - exercise induced  . Diabetes mellitus     recent dx 11/17/11 - started med 11/18/11  . Goiter 07/27/2011    non-neoplastic goiter - fine needle aspiration - benign  . Low back pain     history  . Anxiety     no meds  . Depression     no meds  . IBS (irritable bowel syndrome)     tx with diet per pt  . Herpes genitalis in women     Current Outpatient Prescriptions  Medication Sig Dispense Refill  . albuterol (PROVENTIL HFA;VENTOLIN HFA) 108 (90 BASE) MCG/ACT inhaler Inhale 1-2 puffs into the lungs every 6 (six) hours as needed for wheezing or shortness of breath. 1 Inhaler 0  . aspirin 81 MG chewable tablet Chew 81 mg by mouth daily.    . carvedilol (COREG) 25 MG tablet Take 1 tablet (25 mg total) by mouth 2 (two) times daily with a meal. 60 tablet 2  . digoxin (LANOXIN) 0.125 MG tablet Take 1 tablet (0.125 mg total) by mouth daily. 30 tablet 6  . famotidine (PEPCID) 20 MG tablet  Take 20 mg by mouth daily as needed for heartburn or indigestion.    . furosemide (LASIX) 40 MG tablet Take 1 tablet (40 mg total) by mouth 2 (two) times daily. 60 tablet 3  . glimepiride (AMARYL) 2 MG tablet Take 2 mg by mouth daily with breakfast.    . metFORMIN (GLUCOPHAGE) 500 MG tablet Take 500 mg by mouth 2 (two) times daily with a meal.    . metoCLOPramide (REGLAN) 10 MG tablet Take 10 mg by mouth 4 (four) times daily.    Marland Kitchen omeprazole (PRILOSEC) 20 MG capsule Take 20 mg by mouth 2 (two) times daily before a meal.    . Polyethyl Glycol-Propyl Glycol (SYSTANE) 0.4-0.3 % SOLN Apply 1 drop to eye 2 (two) times daily as needed (dry eyes).    . polyethylene glycol powder (GLYCOLAX/MIRALAX) powder Take 255 g by mouth 2 (two) times  daily. Until daily soft stools  OTC 850 g 0  . sacubitril-valsartan (ENTRESTO) 49-51 MG Take 1 tablet by mouth 2 (two) times daily. 60 tablet 3  . terconazole (TERAZOL 3) 0.8 % vaginal cream Place 1 applicator vaginally at bedtime. 20 g 1   No current facility-administered medications for this encounter.   Facility-Administered Medications Ordered in Other Encounters  Medication Dose Route Frequency Provider Last Rate Last Dose  . aminophylline injection 150 mg  150 mg Intravenous BID PRN Larey Dresser, MD   150 mg at 12/24/14 1005     Allergies  Allergen Reactions  . Shrimp [Shellfish Allergy] Anaphylaxis    History   Social History  . Marital Status: Legally Separated    Spouse Name: N/A  . Number of Children: Y  . 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 Smoking status: Never Smoker   . Smokeless tobacco: Never Used  . Alcohol Use: No  . Drug Use: No  . Sexual Activity: Yes    Birth Control/ Protection: Surgical   Other Topics Concern  . Not on file   Social History Narrative   She lives with her husband and son.  She is works for [employer].    Family History  Problem Relation Age of Onset  . Heart disease Maternal Aunt   . Breast cancer Mother   . Hypertension Maternal Grandmother   . Breast cancer Maternal Aunt     PHYSICAL EXAM: Filed Vitals:   01/07/15 1136  BP: 112/74  Pulse: 69  Weight 194 pounds.   General:  Well appearing. No respiratory difficulty HEENT:  Normal Neck: supple. JVP 6-7 Carotids 2+ bilat; no bruits. No lymphadenopathy. +  nodular goiter Cor: PMI nondisplaced. Regular rate & rhythm. No rubs or murmurs.  Lungs: clear Abdomen: soft, nontender, obese, nondistended. No hepatosplenomegaly. No bruits or masses. Good bowel sounds. Extremities: no cyanosis, clubbing, rash., R and LLE trace edema.  Neuro: alert & oriented x 3, cranial nerves  grossly intact. moves all 4 extremities w/o difficulty. Affect pleasant.   ASSESSMENT & PLAN: 1. Chronic systolic HF-NICM    Previous EF 60% 10/2012 but back down -->ECHO 09/03/2014. EF 20-25% Grade II DD.  -->Had ECHO today -->EF improving 35-40%   NYHA II-III-Volume status stable. -Continue lasix to 40 bid.  -On goal dose of carvedilol 25 bid. Continue digoxin 0.125 mg daily . Dig level today 0.5   -Continue entresto 49-51 twice a day. Referred to [facility]-   -  Continue 25 mg inspra daily.   Reinforced medication compliance, low salt diet, and limiting fluid intake to < 2 liters per day.  2. HTN   --Blood pressure well controlled.  3. ? H/O CAD  --06/18/2013 . cMRI without scar. Continue current regimen.  4. OSA  -- Continue  CPAP nightly.  5. Goiter  -- followed by PCP.  6. Chest Tightness- 12/24/2014 Stress Test Normal NICM.   7. Palpitations- Place 30 day event monitor completes 01/16/2015.   Follow up in 2 months.   Mindie Rawdon, NP-C  11:46 AM

## 2015-01-07 NOTE — Patient Instructions (Signed)
LABS today (dig level)  FOLLOW UP in 2 months.

## 2015-01-07 NOTE — Progress Notes (Signed)
Advanced Heart Failure Medication Review by a Pharmacist  Does the patient  feel that his/her medications are working for him/her?  yes  Has the patient been experiencing any side effects to the medications prescribed?  no  Does the patient measure his/her own blood pressure or blood glucose at home?  no   Does the patient have any problems obtaining medications due to transportation or finances?   no  Understanding of regimen: good Understanding of indications: good Potential of compliance: good    Pharmacist comments: Patient presents to HF clinic and medications were reviewed with a pharmacist. No discrepancies noted, patient does not have any questions at this time.   Megan E. Supple, Pharm.D Clinical Pharmacy Resident Pager: [phone] 01/07/2015 11:42 AM

## 2015-01-09 LAB — URINE CULTURE: Colony Count: 50000

## 2015-01-18 ENCOUNTER — Telehealth (HOSPITAL_COMMUNITY): Payer: Self-pay | Admitting: Vascular Surgery

## 2015-01-18 NOTE — Telephone Encounter (Signed)
Latest paperwork update pending signature by MD before it can be sent.

## 2015-01-18 NOTE — Telephone Encounter (Signed)
Pt called she states disability needs a copy of her Echo .Susan Peck

## 2015-01-21 DIAGNOSIS — Z0271 Encounter for disability determination: Secondary | ICD-10-CM

## 2015-01-28 ENCOUNTER — Other Ambulatory Visit (HOSPITAL_COMMUNITY): Payer: Self-pay | Admitting: *Deleted

## 2015-01-28 ENCOUNTER — Telehealth: Payer: Self-pay

## 2015-01-28 DIAGNOSIS — I5022 Chronic systolic (congestive) heart failure: Secondary | ICD-10-CM

## 2015-01-28 MED ORDER — CARVEDILOL 25 MG PO TABS: 25.0000 mg | ORAL_TABLET | Freq: Two times a day (BID) | ORAL | Status: DC

## 2015-01-28 NOTE — Telephone Encounter (Signed)
Water-based lubricants easily cause less irritation if has sensitive skin, oil based are often better. Personal preference

## 2015-01-28 NOTE — Telephone Encounter (Signed)
Patient called inquiring what type of lubricant would be best for her to use with intercourse? She questioned oil based or water based?

## 2015-01-29 ENCOUNTER — Encounter (HOSPITAL_COMMUNITY): Payer: Self-pay

## 2015-01-29 NOTE — Telephone Encounter (Signed)
Left message to call.

## 2015-01-29 NOTE — Telephone Encounter (Signed)
Patient called back.  I informed her. She asked if either of these type lubricants could cause her herpes sx. I told her lubricant does not affect herpes outbreak.

## 2015-01-29 NOTE — Progress Notes (Signed)
[place] faxed medical record request to [facility] to obtain all records 12/11/14-present. All records 12/10/14-present available from our office faxed to provided # [phone] Case # [id number] Copy of request scanned into electronic medical records to reference.  [name]

## 2015-02-08 ENCOUNTER — Encounter: Payer: Self-pay | Admitting: Gynecology

## 2015-02-09 ENCOUNTER — Encounter: Payer: Self-pay | Admitting: Gynecology

## 2015-02-09 ENCOUNTER — Ambulatory Visit (INDEPENDENT_AMBULATORY_CARE_PROVIDER_SITE_OTHER): Payer: 59 | Admitting: Gynecology

## 2015-02-09 VITALS — BP 124/78 | Ht 63.75 in | Wt 194.0 lb

## 2015-02-09 DIAGNOSIS — E049 Nontoxic goiter, unspecified: Secondary | ICD-10-CM

## 2015-02-09 DIAGNOSIS — E010 Iodine-deficiency related diffuse (endemic) goiter: Secondary | ICD-10-CM

## 2015-02-09 DIAGNOSIS — IMO0002 Reserved for concepts with insufficient information to code with codable children: Secondary | ICD-10-CM

## 2015-02-09 DIAGNOSIS — N941 Dyspareunia: Secondary | ICD-10-CM | POA: Diagnosis not present

## 2015-02-09 DIAGNOSIS — Z113 Encounter for screening for infections with a predominantly sexual mode of transmission: Secondary | ICD-10-CM | POA: Diagnosis not present

## 2015-02-09 DIAGNOSIS — N951 Menopausal and female climacteric states: Secondary | ICD-10-CM

## 2015-02-09 DIAGNOSIS — Z01419 Encounter for gynecological examination (general) (routine) without abnormal findings: Secondary | ICD-10-CM

## 2015-02-09 LAB — THYROID PANEL WITH TSH
Free Thyroxine Index: 2.4 (ref 1.4–3.8)
T3 Uptake: 31 % (ref 22–35)
T4, Total: 7.6 ug/dL (ref 4.5–12.0)
TSH: 1.742 u[IU]/mL (ref 0.350–4.500)

## 2015-02-09 LAB — HIV ANTIBODY (ROUTINE TESTING W REFLEX): HIV 1&2 Ab, 4th Generation: NONREACTIVE

## 2015-02-09 LAB — HEPATITIS C ANTIBODY: HCV Ab: NEGATIVE

## 2015-02-09 LAB — HEPATITIS B SURFACE ANTIGEN: Hepatitis B Surface Ag: NEGATIVE

## 2015-02-09 MED ORDER — EST ESTROGENS-METHYLTEST 0.625-1.25 MG PO TABS: 1.0000 | ORAL_TABLET | Freq: Every day | ORAL | Status: DC

## 2015-02-09 NOTE — Progress Notes (Signed)
Susan Peck 04/22/1966 [id number]   History:    49 y.o.  for annual GYN exam who was complaining of dyspareunia, vasomotor symptoms which has worsened over the past few years. A few years ago her [place] was in the early menopause range. She never started as suture replacement therapy as had been recommended. Patient also with new sexual partner the past month like to have an STD. She denies any vaginal discharge. Several years ago she was diagnosed with HSV of the external genitalia but has not had any recurrence in over a period in 2013 by another provider she had a laparoscopic-assisted vaginal hysterectomy.  Patient had a colonoscopy in 2008 and stated that benign polyps were removed and the had informed her that her next colonoscopy would be 10 years after that date. Patient denies any high-risk abnormal Pap smear before her hysterectomy or after. Her PCP is Dr. Ayesha Rumpf who has been doing her blood work.  Past medical history,surgical history, family history and social history were all reviewed and documented in the EPIC chart.  Gynecologic History No LMP recorded. Patient has had a hysterectomy. Contraception: status post hysterectomy Last Pap: 2012. Results were: normal Last mammogram: 2016. Results were: normal  Obstetric History OB History  Gravida Para Term Preterm AB SAB TAB Ectopic Multiple Living  7 3   3 1    3     # Outcome Date GA Lbr Len/2nd Weight Sex Delivery Anes PTL Lv  7 SAB           6 AB           5 AB           4 Para           3 Para           2 Para           1 Gravida                ROS: A ROS was performed and pertinent positives and negatives are included in the history.  GENERAL: No fevers or chills. HEENT: No change in vision, no earache, sore throat or sinus congestion. NECK: No pain or stiffness. CARDIOVASCULAR: No chest pain or pressure. No palpitations. PULMONARY: No shortness of breath, cough or wheeze. GASTROINTESTINAL: No abdominal pain,  nausea, vomiting or diarrhea, melena or bright red blood per rectum. GENITOURINARY: No urinary frequency, urgency, hesitancy or dysuria. MUSCULOSKELETAL: No joint or muscle pain, no back pain, no recent trauma. DERMATOLOGIC: No rash, no itching, no lesions. ENDOCRINE: No polyuria, polydipsia, no heat or cold intolerance. No recent change in weight. HEMATOLOGICAL: No anemia or easy bruising or bleeding. NEUROLOGIC: No headache, seizures, numbness, tingling or weakness. PSYCHIATRIC: No depression, no loss of interest in normal activity or change in sleep pattern.     Exam: chaperone present  BP 124/78 mmHg  Ht 5' 3.75" (1.619 m)  Wt 194 lb (87.998 kg)  BMI 33.57 kg/m2  Body mass index is 33.57 kg/(m^2).  General appearance : Well developed well nourished female. No acute distress HEENT: Eyes: no retinal hemorrhage or exudates,  Neck supple, trachea midline, no carotid bruits, right thyroid nodule 2-1/2 cm in size nontender mobile Lungs: Clear to auscultation, no rhonchi or wheezes, or rib retractions  Heart: Regular rate and rhythm, no murmurs or gallops Breast:Examined in sitting and supine position were symmetrical in appearance, no palpable masses or tenderness,  no skin retraction, no nipple inversion, no nipple discharge,  no skin discoloration, no axillary or supraclavicular lymphadenopathy Abdomen: no palpable masses or tenderness, no rebound or guarding Extremities: no edema or skin discoloration or tenderness  Pelvic:  B[place]olin, Urethra, Skene Glands: Within normal limits             Vagina: No gross lesions or discharge  Cervix: Absent  Uterus  absent  Adnexa  Without masses or tenderness  Anus and perineum  normal   Rectovaginal  normal sphincter tone without palpated masses or tenderness             Hemoccult not indicated     Assessment/Plan:  49 y.o. female for annual exam with incidental finding of right thyroid nodule. A thyroid ultrasound will be ordered along with a  thyroid panel. We'll wait for the results and referred to endocrinologist as indicated. Because of her vasomotor symptoms and FSH will be drawn today. Also patient requests an STD screening so a GC and Chlamydia culture along with HIV, RPR, hepatitis B and C was obtained result pending at time of this dictation. Pap smear not indicated. Patient is reminded do her monthly breast exams. Because of her vasomotor symptoms if her [place]  confirms that indeed she is menopausal I'm going to start her on Estratest 0.625 mg 1 by mouth daily. The risks benefits and pros and cons of hormone replacement therapy were discussed with the patient. The women's health initiative study was discussed. Patient fully understands and accepts and literature information will be provided. We discussed importance of calcium vitamin D and regular exercise for osteoporosis. We discussed that if she would ever to have a genital outbreak from HSV to refrain from intercourse for 2 weeks.   Terrance Mass MD, 11:43 AM 02/09/2015

## 2015-02-09 NOTE — Addendum Note (Signed)
Addended by: Thurnell Garbe A on: 02/09/2015 12:20 PM   Modules accepted: Orders, SmartSet

## 2015-02-09 NOTE — Patient Instructions (Addendum)
Menopause Menopause is the normal time of life when menstrual periods stop completely. Menopause is complete when you have missed 12 consecutive menstrual periods. It usually occurs between the ages of 66 years and 32 years. Very rarely does a woman develop menopause before the age of 21 years. At menopause, your ovaries stop producing the female hormones estrogen and progesterone. This can cause undesirable symptoms and also affect your health. Sometimes the symptoms may occur 4-5 years before the menopause begins. There is no relationship between menopause and:  Oral contraceptives.  Number of children you had.  Race.  The age your menstrual periods started (menarche). Heavy smokers and very thin women may develop menopause earlier in life. CAUSES  The ovaries stop producing the female hormones estrogen and progesterone.  Other causes include:  Surgery to remove both ovaries.  The ovaries stop functioning for no known reason.  Tumors of the pituitary gland in the brain.   Medical disease that affects the ovaries and hormone production.  Radiation treatment to the abdomen or pelvis.  Chemotherapy that affects the ovaries. SYMPTOMS   Hot flashes.  Night sweats.  Decrease in sex drive.  Vaginal dryness and thinning of the vagina causing painful intercourse.  Dryness of the skin and developing wrinkles.  Headaches.  Tiredness.  Irritability.  Memory problems.  Weight gain.  Bladder infections.  Hair growth of the face and chest.  Infertility. More serious symptoms include:  Loss of bone (osteoporosis) causing breaks (fractures).  Depression.  Hardening and narrowing of the arteries (atherosclerosis) causing heart attacks and strokes. DIAGNOSIS   When the menstrual periods have stopped for 12 straight months.  Physical exam.  Hormone studies of the blood. TREATMENT  There are many treatment choices and nearly as many questions about them. The  decisions to treat or not to treat menopausal changes is an individual choice made with your health care provider. Your health care provider can discuss the treatments with you. Together, you can decide which treatment will work best for you. Your treatment choices may include:   Hormone therapy (estrogen and progesterone).  Non-hormonal medicines.  Treating the individual symptoms with medicine (for example antidepressants for depression).  Herbal medicines that may help specific symptoms.  Counseling by a psychiatrist or psychologist.  Group therapy.  Lifestyle changes including:  Eating healthy.  Regular exercise.  Limiting caffeine and alcohol.  Stress management and meditation.  No treatment. HOME CARE INSTRUCTIONS   Take the medicine your health care provider gives you as directed.  Get plenty of sleep and rest.  Exercise regularly.  Eat a diet that contains calcium (good for the bones) and soy products (acts like estrogen hormone).  Avoid alcoholic beverages.  Do not smoke.  If you have hot flashes, dress in layers.  Take supplements, calcium, and vitamin D to strengthen bones.  You can use over-the-counter lubricants or moisturizers for vaginal dryness.  Group therapy is sometimes very helpful.  Acupuncture may be helpful in some cases. SEEK MEDICAL CARE IF:   You are not sure you are in menopause.  You are having menopausal symptoms and need advice and treatment.  You are still having menstrual periods after age 24 years.  You have pain with intercourse.  Menopause is complete (no menstrual period for 12 months) and you develop vaginal bleeding.  You need a referral to a specialist (gynecologist, psychiatrist, or psychologist) for treatment. SEEK IMMEDIATE MEDICAL CARE IF:   You have severe depression.  You have excessive vaginal bleeding.  You fell and think you have a broken bone.  You have pain when you urinate.  You develop leg or  chest pain.  You have a fast pounding heart beat (palpitations).  You have severe headaches.  You develop vision problems.  You feel a lump in your breast.  You have abdominal pain or severe indigestion. Document Released: 09/30/2003 Document Revised: 03/12/2013 Document Reviewed: 02/06/2013 [facility] Patient Information 2015 [place], [place]. This information is not intended to replace advice given to you by your health care provider. Make sure you discuss any questions you have with your health care provider. Esterified Estrogens; Methyltestosterone tablets What is this medicine? ESTERIFIED ESTROGENS; METHYLTESTOSTERONE (es TAIR i fyed ES troe jenz; meth il tes TOS te rone) is a combination of hormones. This medicine is used to treat some of the symptoms of menopause like hot flashes and vaginal dryness. This medicine may be used for other purposes; ask your health care provider or pharmacist if you have questions. COMMON BRAND NAME(S): Covaryx, Covaryx H.S., EEMT, EEMT HS, Essian, Essian HS, Estratest, Estratest HS, Syntest DS, Syntest HS What should I tell my health care provider before I take this medicine? They need to know if you have any of these conditions: -abnormal vaginal bleeding -blood vessel disease or blood clots -breast, cervical, endometrial, ovarian, liver, or uterine cancer -dementia -diabetes -gallbladder disease -heart disease or recent heart attack -high blood pressure -high cholesterol -high level of calcium in the blood -hysterectomy -kidney disease -liver disease -migraine headaches -stroke -systemic lupus erythematosus (SLE) -tobacco smoker -vaginal bleeding -an unusual or allergic reaction to estrogens, other hormones, medicines, foods, dyes, or preservatives -pregnant or trying to get pregnant -breast-feeding How should I use this medicine? Take this medicine by mouth with a glass of water. To reduce nausea, this medicine may be taken with food.  Follow the directions on the prescription label. Take this medicine at the same time each day. Do not take your medicine more often than directed. Talk to your pediatrician regarding the use of this medicine in children. Special care may be needed. A patient package insert for the product will be given with each prescription and refill. Read this sheet carefully each time. The sheet may change frequently. Overdosage: If you think you have taken too much of this medicine contact a poison control center or emergency room at once. NOTE: This medicine is only for you. Do not share this medicine with others. What if I miss a dose? If you miss a dose, take it as soon as you can. If it is almost time for your next dose, take only that dose. Do not take double or extra doses. What may interact with this medicine? Do not take this medicine with any of the following medications: -medicines for cancer like aminoglutethimide, anastrozole, exemestane, letrozole, testolactone, vorozole This medicine may also interact with the following medications: -antibiotics like erythromycin, clarithromycin -carbamazepine -female hormones, like estrogens or progestins and birth control pills -grapefruit juice -herbal remedies for menopause or female problems -insulin -itraconazole -ketoconazole -medicines that treat or prevent blood clots like warfarin -oxyphenbutazone -phenobarbital -rifampin -ritonavir -St. John's Wort This list may not describe all possible interactions. Give your health care provider a list of all the medicines, herbs, non-prescription drugs, or dietary supplements you use. Also tell them if you smoke, drink alcohol, or use illegal drugs. Some items may interact with your medicine. What should I watch for while using this medicine? Visit your doctor or health care professional for regular  checks on your progress. You will need a regular breast and pelvic exam and Pap smear while on this  medicine. You should also discuss the need for regular mammograms with your health care professional, and follow his or her guidelines for these tests. This medicine can make your body retain fluid, making your fingers, hands, or ankles swell. Your blood pressure can go up. Contact your doctor or health care professional if you feel you are retaining fluid. If you have any reason to think you are pregnant, stop taking this medicine right away and contact your doctor or health care professional. Smoking increases the risk of getting a blood clot or having a stroke while you are taking this medicine, especially if you are more than 49 years old. You are strongly advised not to smoke. If you wear contact lenses and notice visual changes, or if the lenses begin to feel uncomfortable, consult your eye doctor or health care professional. This medicine can increase the risk of developing a condition (endometrial hyperplasia) that may lead to cancer of the lining of the uterus. Taking progestins, another hormone drug, with this medicine lowers the risk of developing this condition. Therefore, if your uterus has not been removed (by a hysterectomy), your doctor may prescribe a progestin for you to take together with your estrogen. You should know, however, that taking estrogens with progestins may have additional health risks. You should discuss the use of estrogens and progestins with your health care professional to determine the benefits and risks for you. If you are going to have surgery, you may need to stop taking this medicine. Consult your health care professional for advice before you schedule the surgery. What side effects may I notice from receiving this medicine? Side effects that you should report to your doctor or health care professional as soon as possible: -allergic reactions like skin rash, itching or hives, swelling of the face, lips, or tongue -breast tissue changes or discharge -changes in  vision -chest pain -confusion, trouble speaking or understanding -dark urine -general ill feeling or flu-like symptoms -light-colored stools -nausea, vomiting -pain, swelling, warmth in the leg -right upper belly pain -severe headaches -shortness of breath -sudden numbness or weakness of the face, arm or leg -trouble walking, dizziness, loss of balance or coordination -unusual vaginal bleeding -yellowing of the eyes or skin Side effects that usually do not require medical attention (report to your doctor or health care professional if they continue or are bothersome): -hair loss -increased hunger or thirst -increased urination -symptoms of vaginal infection like itching, irritation or unusual discharge -unusually weak or tired This list may not describe all possible side effects. Call your doctor for medical advice about side effects. You may report side effects to FDA at 1-800-FDA-1088. Where should I keep my medicine? Keep out of the reach of children. Store at room temperature between 15 and 30 degrees C (59 and 86 degrees F). Throw away any unused medicine after the expiration date. NOTE: This sheet is a summary. It may not cover all possible information. If you have questions about this medicine, talk to your doctor, pharmacist, or health care provider.  2015, [employer]vier/[employer] Standard. (2008-06-25 11:46:40) Hormone Therapy At menopause, your body begins making less estrogen and progesterone hormones. This causes the body to stop having menstrual periods. This is because estrogen and progesterone hormones control your periods and menstrual cycle. A lack of estrogen may cause symptoms such as: Hot flushes (or hot flashes). Vaginal dryness. Dry skin. Loss of  sex drive. Risk of bone loss (osteoporosis). When this happens, you may choose to take hormone therapy to get back the estrogen lost during menopause. When the hormone estrogen is given alone, it is usually referred to as ET  (Estrogen Therapy). When the hormone progestin is combined with estrogen, it is generally called HT (Hormone Therapy). This was formerly known as hormone replacement therapy (HRT). Your caregiver can help you make a decision on what will be best for you. The decision to use HT seems to change often as new studies are done. Many studies do not agree on the benefits of hormone replacement therapy. LIKELY BENEFITS OF HT INCLUDE PROTECTION FROM: Hot Flushes (also called hot flashes) - A hot flush is a sudden feeling of heat that spreads over the face and body. The skin may redden like a blush. It is connected with sweats and sleep disturbance. Women going through menopause may have hot flushes a few times a month or several times per day depending on the woman. Osteoporosis (bone loss)- Estrogen helps guard against bone loss. After menopause, a woman's bones slowly lose calcium and become weak and brittle. As a result, bones are more likely to break. The hip, wrist, and spine are affected most often. Hormone therapy can help slow bone loss after menopause. Weight bearing exercise and taking calcium with vitamin D also can help prevent bone loss. There are also medications that your caregiver can prescribe that can help prevent osteoporosis. Vaginal Dryness - Loss of estrogen causes changes in the vagina. Its lining may become thin and dry. These changes can cause pain and bleeding during sexual intercourse. Dryness can also lead to infections. This can cause burning and itching. (Vaginal estrogen treatment can help relieve pain, itching, and dryness.) Urinary Tract Infections are more common after menopause because of lack of estrogen. Some women also develop urinary incontinence because of low estrogen levels in the vagina and bladder. Possible other benefits of estrogen include a positive effect on mood and short-term memory in women. RISKS AND COMPLICATIONS Using estrogen alone without progesterone causes the  lining of the uterus to grow. This increases the risk of lining of the uterus (endometrial) cancer. Your caregiver should give another hormone called progestin if you have a uterus. Women who take combined (estrogen and progestin) HT appear to have an increased risk of breast cancer. The risk appears to be small, but increases throughout the time that HT is taken. Combined therapy also makes the breast tissue slightly denser which makes it harder to read mammograms (breast X-rays). Combined, estrogen and progesterone therapy can be taken together every day, in which case there may be spotting of blood. HT therapy can be taken cyclically in which case you will have menstrual periods. Cyclically means HT is taken for a set amount of days, then not taken, then this process is repeated. HT may increase the risk of stroke, heart attack, breast cancer and forming blood clots in your leg. Transdermal estrogen (estrogen that is absorbed through the skin with a patch or a cream) may have more positive results with: Cholesterol. Blood pressure. Blood clots. Having the following conditions may indicate you should not have HT: Endometrial cancer. Liver disease. Breast cancer. Heart disease. History of blood clots. Stroke. TREATMENT  If you choose to take HT and have a uterus, usually estrogen and progestin are prescribed. Your caregiver will help you decide the best way to take the medications. Possible ways to take estrogen include: Pills. Patches. Gels. Sprays.  Vaginal estrogen cream, rings and tablets. It is best to take the lowest dose possible that will help your symptoms and take them for the shortest period of time that you can. Hormone therapy can help relieve some of the problems (symptoms) that affect women at menopause. Before making a decision about HT, talk to your caregiver about what is best for you. Be well informed and comfortable with your decisions. HOME CARE INSTRUCTIONS  Follow  your caregivers advice when taking the medications. A Pap test is done to screen for cervical cancer. The first Pap test should be done at age 26. Between ages 59 and 91, Pap tests are repeated every 2 years. Beginning at age 40, you are advised to have a Pap test every 3 years as long as your past 3 Pap tests have been normal. Some women have medical problems that increase the chance of getting cervical cancer. Talk to your caregiver about these problems. It is especially important to talk to your caregiver if a new problem develops soon after your last Pap test. In these cases, your caregiver may recommend more frequent screening and Pap tests. The above recommendations are the same for women who have or have not gotten the vaccine for HPV (Human Papillomavirus). If you had a hysterectomy for a problem that was not a cancer or a condition that could lead to cancer, then you no longer need Pap tests. However, even if you no longer need a Pap test, a regular exam is a good idea to make sure no other problems are starting.  If you are between ages 1 and 27, and you have had normal Pap tests going back 10 years, you no longer need Pap tests. However, even if you no longer need a Pap test, a regular exam is a good idea to make sure no other problems are starting.  If you have had past treatment for cervical cancer or a condition that could lead to cancer, you need Pap tests and screening for cancer for at least 20 years after your treatment. If Pap tests have been discontinued, risk factors (such as a new sexual partner) need to be re-assessed to determine if screening should be resumed. Some women may need screenings more often if they are at high risk for cervical cancer. Get mammograms done as per the advice of your caregiver. SEEK IMMEDIATE MEDICAL CARE IF: You develop abnormal vaginal bleeding. You have pain or swelling in your legs, shortness of breath, or chest pain. You develop dizziness  or headaches. You have lumps or changes in your breasts or armpits. You have slurred speech. You develop weakness or numbness of your arms or legs. You have pain, burning, or bleeding when urinating. You develop abdominal pain. Document Released: 04/08/2003 Document Revised: 10/02/2011 Document Reviewed: 07/27/2010 [facility] Patient Information 2015 [place], [place]. This information is not intended to replace advice given to you by your health care provider. Make sure you discuss any questions you have with your health care provider.

## 2015-02-10 ENCOUNTER — Telehealth: Payer: Self-pay | Admitting: *Deleted

## 2015-02-10 ENCOUNTER — Other Ambulatory Visit: Payer: Self-pay | Admitting: Gynecology

## 2015-02-10 DIAGNOSIS — E010 Iodine-deficiency related diffuse (endemic) goiter: Secondary | ICD-10-CM

## 2015-02-10 LAB — GC/CHLAMYDIA PROBE AMP
CT Probe RNA: NEGATIVE
GC Probe RNA: NEGATIVE

## 2015-02-10 LAB — RPR

## 2015-02-10 LAB — FOLLICLE STIMULATING HORMONE: FSH: 23.3 m[IU]/mL

## 2015-02-10 NOTE — Telephone Encounter (Signed)
Appointment on 02/22/15 @ 9:30am pt informed and informed all STD screen results negative as well.

## 2015-02-10 NOTE — Telephone Encounter (Signed)
-----   Message from Terrance Mass, MD sent at 02/09/2015 11:42 AM EDT ----- Please schedule thyroid ultrasound on this patient with right thyroid nodule

## 2015-02-22 ENCOUNTER — Telehealth: Payer: Self-pay | Admitting: *Deleted

## 2015-02-22 ENCOUNTER — Ambulatory Visit (HOSPITAL_COMMUNITY)
Admission: RE | Admit: 2015-02-22 | Discharge: 2015-02-22 | Disposition: A | Discharge status: Home / Self Care | Payer: 59 | Source: Ambulatory Visit | Attending: Gynecology | Admitting: Gynecology

## 2015-02-22 ENCOUNTER — Telehealth: Payer: Self-pay | Admitting: Licensed Clinical Social Worker

## 2015-02-22 ENCOUNTER — Encounter (HOSPITAL_COMMUNITY): Payer: Self-pay

## 2015-02-22 DIAGNOSIS — E042 Nontoxic multinodular goiter: Secondary | ICD-10-CM

## 2015-02-22 DIAGNOSIS — Z0271 Encounter for disability determination: Secondary | ICD-10-CM

## 2015-02-22 DIAGNOSIS — E010 Iodine-deficiency related diffuse (endemic) goiter: Secondary | ICD-10-CM

## 2015-02-22 DIAGNOSIS — E049 Nontoxic goiter, unspecified: Secondary | ICD-10-CM | POA: Diagnosis present

## 2015-02-22 NOTE — Telephone Encounter (Signed)
Pt informed with the below note, pt said the Nurse practitioner order the study in 2013 of ultrasound and she is with her cardiologist. She has a PCP, but very hard to get her to do anything for her. Do suggest pt get established with endocrinologist? Please advise

## 2015-02-22 NOTE — Telephone Encounter (Signed)
Patient contacted CSW to inform that she received a letter of denial from [employer]. Patient reports some of the medical records were not included in her report. She states she has contacted her primary MD office to request the paperwork to be submitted with her request for appeal. Patient states she is frustrated but motivated to get completed. CSW offered support and will be available as needed. Raquel Sarna, [place]

## 2015-02-22 NOTE — Telephone Encounter (Signed)
I would refer her to Dr. Layla Maw with [facility]

## 2015-02-22 NOTE — Progress Notes (Signed)
2nd medical record request fax received from [place] stating previous record request sent to [place] on 01/08/2015 was never answered with records returned to them.  However, our documentation states that we forwarded requested medical records from that initial request to their office on 01/29/2015.  That documentation, along with records requested, refaxed to [facility] at given fax # [phone]. Case # [id number] Copy of second notification request scanned into electronic medical records.  [name]

## 2015-02-22 NOTE — Telephone Encounter (Signed)
-----   Message from Terrance Mass, MD sent at 02/22/2015  3:46 PM EDT ----- Inform patient multiple thyroid nodules seen on ultrasound especially ones that had been biopsied in the past. Her TFT's were in normal range. Find out if it was in [place] who ever biopsied her thyroid nodule in the past. I would recommend she follow up with them

## 2015-02-23 NOTE — Telephone Encounter (Signed)
Left on pt voicemail referral placed they will contact her to schedule.

## 2015-02-25 NOTE — Telephone Encounter (Signed)
Appointment 03/24/15 @ 10:30am

## 2015-03-08 ENCOUNTER — Telehealth: Payer: Self-pay

## 2015-03-08 NOTE — Telephone Encounter (Signed)
Please make appointment for her with Dr. [facility] for blood in stool

## 2015-03-08 NOTE — Telephone Encounter (Signed)
Forwarded to [facility] to handle referral.

## 2015-03-08 NOTE — Telephone Encounter (Signed)
Patient saw PCP 4 weeks ago and regarding blood in stool. She said pcp did digital exam and confirmed blood in stool and said she needed to refer her to GI MD.  She said they never called her and every time she calls there she cannot get an answer.  She wants to know if you will refer her to Dr. Benson [facility] for rectal bleeding.

## 2015-03-09 ENCOUNTER — Telehealth: Payer: Self-pay | Admitting: *Deleted

## 2015-03-09 NOTE — Telephone Encounter (Signed)
Note faxed to [place] office, they will contact pt to schedule.

## 2015-03-09 NOTE — Telephone Encounter (Signed)
Dr. [facility] office said [facility] referral is needed before they can schedule appointment. I called pt and told her this and to call her PCP office to get referral per [employer]. Pt agreed she would relay to Dr. Irven Shelling.

## 2015-03-09 NOTE — Telephone Encounter (Signed)
-----   Message from Ramond Craver, [place] sent at 03/08/2015  4:32 PM EDT ----- Regarding: FW: referral to GI   ----- Message -----    From: Ramond Craver, RMA    Sent: 03/08/2015   4:24 PM      To: Thamas Jaegers, RMA Subject: referral to GI                                 Per Dr. Moshe Salisbury "Please make appointment for her with Dr. [facility] for blood in stool."  Anderson [place], this is patient who said she could not get her PCP to give her a referral. Thanks!)  Her # [phone]

## 2015-03-15 ENCOUNTER — Ambulatory Visit (HOSPITAL_COMMUNITY)
Admission: RE | Admit: 2015-03-15 | Discharge: 2015-03-15 | Disposition: A | Discharge status: Home / Self Care | Payer: 59 | Source: Ambulatory Visit | Attending: Cardiology | Admitting: Cardiology

## 2015-03-15 ENCOUNTER — Encounter (HOSPITAL_COMMUNITY): Payer: Self-pay

## 2015-03-15 VITALS — BP 116/72 | HR 71 | Wt 197.5 lb

## 2015-03-15 DIAGNOSIS — I5022 Chronic systolic (congestive) heart failure: Secondary | ICD-10-CM

## 2015-03-15 DIAGNOSIS — K219 Gastro-esophageal reflux disease without esophagitis: Secondary | ICD-10-CM | POA: Diagnosis not present

## 2015-03-15 DIAGNOSIS — I429 Cardiomyopathy, unspecified: Secondary | ICD-10-CM | POA: Diagnosis not present

## 2015-03-15 DIAGNOSIS — Z7982 Long term (current) use of aspirin: Secondary | ICD-10-CM | POA: Diagnosis not present

## 2015-03-15 DIAGNOSIS — I2510 Atherosclerotic heart disease of native coronary artery without angina pectoris: Secondary | ICD-10-CM | POA: Insufficient documentation

## 2015-03-15 DIAGNOSIS — Z79899 Other long term (current) drug therapy: Secondary | ICD-10-CM | POA: Insufficient documentation

## 2015-03-15 DIAGNOSIS — E042 Nontoxic multinodular goiter: Secondary | ICD-10-CM | POA: Diagnosis not present

## 2015-03-15 DIAGNOSIS — G4733 Obstructive sleep apnea (adult) (pediatric): Secondary | ICD-10-CM | POA: Diagnosis not present

## 2015-03-15 DIAGNOSIS — J45909 Unspecified asthma, uncomplicated: Secondary | ICD-10-CM | POA: Insufficient documentation

## 2015-03-15 DIAGNOSIS — R0789 Other chest pain: Secondary | ICD-10-CM | POA: Diagnosis not present

## 2015-03-15 DIAGNOSIS — I10 Essential (primary) hypertension: Secondary | ICD-10-CM | POA: Diagnosis not present

## 2015-03-15 LAB — LIPID PANEL
Cholesterol: 206 mg/dL — ABNORMAL HIGH (ref 0–200)
HDL: 29 mg/dL — AB (ref >40)
LDL CALC: 150 mg/dL — AB (ref 0–99)
Total CHOL/HDL Ratio: 7.1 RATIO
Triglycerides: 133 mg/dL (ref <150)
VLDL: 27 mg/dL (ref 0–40)

## 2015-03-15 LAB — BASIC METABOLIC PANEL
Anion gap: 13 (ref 5–15)
BUN: 8 mg/dL (ref 6–20)
CHLORIDE: 100 mmol/L — AB (ref 101–111)
CO2: 24 mmol/L (ref 22–32)
Calcium: 9.5 mg/dL (ref 8.9–10.3)
Creatinine, Ser: 0.63 mg/dL (ref 0.44–1.00)
Glucose, Bld: 245 mg/dL — ABNORMAL HIGH (ref 65–99)
Potassium: 3.5 mmol/L (ref 3.5–5.1)
SODIUM: 137 mmol/L (ref 135–145)

## 2015-03-15 MED ORDER — ISOSORB DINITRATE-HYDRALAZINE 20-37.5 MG PO TABS: 0.5000 | ORAL_TABLET | Freq: Three times a day (TID) | ORAL | Status: DC

## 2015-03-15 NOTE — Patient Instructions (Addendum)
Routine lab work today. Will notify you of abnormal results, otherwise no news is good news!  START Bidil 1/2 tablet three times daily. Use free 30 day voucher, followed by 1 year copay card. Rx sent to [id number] - [place] ([address]), [place] - [address]   Follow up 2 months.  Do the following things EVERYDAY: 1) Weigh yourself in the morning before breakfast. Write it down and keep it in a log. 2) Take your medicines as prescribed 3) Eat low salt foods-Limit salt (sodium) to 2000 mg per day.  4) Stay as active as you can everyday 5) Limit all fluids for the day to less than 2 liters

## 2015-03-15 NOTE — Progress Notes (Signed)
Patient ID: Susan Peck, female   DOB: [birth year], 49 y.o.   MRN: [id number]  PCP: Dr Lin Landsman GYN: Dr Carolin Guernsey Pulmonologist: Dr Gwenette Greet  HPI:  Susan Peck is a 49 year old African American female with systolic heart failure, HTN, GERD, 11/23/11 S/P Hysterectomy, multinodular goiter and asthma.   2004 cardiac cath by Dr Lynnell Jude in [place], [place] with one blockage noted - no stent. EF said to be normal. About 1 year ago at [facility] noted murmur and she was referred to Dr Tamala Julian EF 25-30%. More recently, she has been followed in CHF clinic.   She occasional sharp chest pain that usually occurs when she lies down in bed.  She says that this is different than GERD.  Reflux meds have not helped. Happens about twice a week chronically.  Nonexertional.  She is being evaluated for multinodular goiter.  TSH is normal.   She can walk on flat ground without dyspnea.  No orthopnea, PND, or edema.    05/19/2011  Sleep study revealed moderate obstructive sleep apnea with desaturations noted.   04/03/2011  ECHO EF 25-30% 06/26/2012 ECHO EF 30-35%  02/29/2012 ECHO EF 35-40%  11/18/12 ECHO EF 55% Inferior wall mildly hypokinetic with septal HK Myoview 02/03/10 EF 42% No ischemia Septal and apical hypokinesis 11/14 Cardiac MRI  EF 52%- septal bounce suggestive of LBBB. Dyssynchrony from LBBB leads to the low calculated EF. Normal RV size and systolic function. No myocardial delayed enhancement, so no definite evidence for prior myocardial infarction, myocarditis, or infiltrative disease. 1/16 CT chest - small pleural effusions with early pulmonary edema. No ILD. 09/03/2014: ECHO EF 20-25%  6/16: ECHO EF 35-40% Cardiolite (6/16): EF 36%, no ischemia/infarction.   CPX 11/20/2014  Peak VO2: 16.1 (76.8% predicted peak VO2) VE/VCO2 slope: 28.5 OUES: 1.59 Peak RER: 1.18  Labs 08/19/14: K 4.1 Creatinine 0.62   Labs 10/28/2014: K 4.3 Creatinine 0.73  Labs 11/19/2014: K 3.6 Creatinine 0.64  Labs 6/16: digoxin  0.5 Labs 7/16: HIV negative, TSH normal  ROS: All other systems normal except as mentioned in HPI, past medical history and problem list.    Past Medical History  Diagnosis Date  . Systolic heart failure     May 2011 EF 35-40%, 04/03/11 EF 25-30%, 06/27/11 EF 30-35%  . Hypertension   . Asthma   . Obesity   . GERD (gastroesophageal reflux disease)   . Sleep apnea   . Heart murmur     dx 2 yrs ago per pt  . Seasonal allergies   . Shortness of breath     occasional - exercise induced  . Diabetes mellitus     recent dx 11/17/11 - started med 11/18/11  . Goiter 07/27/2011    non-neoplastic goiter - fine needle aspiration - benign  . Low back pain     history  . Anxiety     no meds  . Depression     no meds  . IBS (irritable bowel syndrome)     tx with diet per pt  . Herpes genitalis in women     Current Outpatient Prescriptions  Medication Sig Dispense Refill  . albuterol (PROVENTIL HFA;VENTOLIN HFA) 108 (90 BASE) MCG/ACT inhaler Inhale 1-2 puffs into the lungs every 6 (six) hours as needed for wheezing or shortness of breath. 1 Inhaler 0  . aspirin 81 MG chewable tablet Chew 81 mg by mouth daily.    . carvedilol (COREG) 25 MG tablet Take 1 tablet (25 mg total) by  mouth 2 (two) times daily with a meal. 60 tablet 6  . digoxin (LANOXIN) 0.125 MG tablet Take 1 tablet (0.125 mg total) by mouth daily. 30 tablet 6  . estrogen-methylTESTOSTERone 0.625-1.25 MG per tablet Take 1 tablet by mouth daily. 30 tablet 5  . famotidine (PEPCID) 20 MG tablet Take 20 mg by mouth daily as needed for heartburn or indigestion.    . furosemide (LASIX) 40 MG tablet Take 1 tablet (40 mg total) by mouth 2 (two) times daily. 60 tablet 3  . glimepiride (AMARYL) 2 MG tablet Take 2 mg by mouth daily with breakfast.    . metFORMIN (GLUCOPHAGE) 500 MG tablet Take 500 mg by mouth 2 (two) times daily with a meal.    . metoCLOPramide (REGLAN) 10 MG tablet Take 10 mg by mouth 4 (four) times daily.    Marland Kitchen omeprazole  (PRILOSEC) 20 MG capsule Take 20 mg by mouth 2 (two) times daily before a meal.    . Polyethyl Glycol-Propyl Glycol (SYSTANE) 0.4-0.3 % SOLN Apply 1 drop to eye 2 (two) times daily as needed (dry eyes).    . polyethylene glycol powder (GLYCOLAX/MIRALAX) powder Take 255 g by mouth 2 (two) times daily. Until daily soft stools  OTC 850 g 0  . sacubitril-valsartan (ENTRESTO) 49-51 MG Take 1 tablet by mouth 2 (two) times daily. 60 tablet 3  . terconazole (TERAZOL 3) 0.8 % vaginal cream Place 1 applicator vaginally at bedtime. 20 g 1  . isosorbide-hydrALAZINE (BIDIL) 20-37.5 MG per tablet Take 0.5 tablets by mouth 3 (three) times daily. 45 tablet 6   No current facility-administered medications for this encounter.   Facility-Administered Medications Ordered in Other Encounters  Medication Dose Route Frequency Provider Last Rate Last Dose  . aminophylline injection 150 mg  150 mg Intravenous BID PRN Larey Dresser, MD   150 mg at 12/24/14 1005     Allergies  Allergen Reactions  . Shrimp [Shellfish Allergy] Anaphylaxis    Social History   Social History  . Marital Status: Legally Separated    Spouse Name: N/A  . Number of Children: Y  . 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 Smoking status: Never Smoker   . Smokeless tobacco: Never Used  . Alcohol Use: No  . Drug Use: No  . Sexual Activity: Yes    Birth Control/ Protection: Surgical   Other Topics Concern  . Not on file   Social History Narrative   She lives with her husband and son.  She is works for [employer].    Family History  Problem Relation Age of Onset  . Heart disease Maternal Aunt   . Breast cancer Mother   . Hypertension Maternal Grandmother   . Breast cancer Maternal Aunt     PHYSICAL EXAM: Filed Vitals:   03/15/15 0853  BP: 116/72  Pulse: 71  Weight 194 pounds.   General:  Well appearing. No respiratory  difficulty HEENT:  Normal Neck: supple. JVP 6-7 Carotids 2+ bilat; no bruits. No lymphadenopathy. +  nodular goiter Cor: PMI nondisplaced. Regular rate & rhythm. No rubs or murmurs.  Lungs: clear Abdomen: soft, nontender, obese, nondistended. No hepatosplenomegaly. No bruits or masses. Good bowel sounds. Extremities: no cyanosis, clubbing, rash., R and LLE trace edema.  Neuro: alert & oriented x 3, cranial nerves grossly intact. moves all 4 extremities w/o difficulty. Affect pleasant.  ASSESSMENT &  PLAN: 1. Chronic systolic HF: Nonischemic cardiomyopathy.  Most recent echo in 6/16 with improved EF 35-40%, outside ICD range. NYHA class II symptoms, volume status stable. Painful gynecomastia with spironolactone.  - Continue lasix to 40 bid, check BMET today.   - On goal dose of carvedilol 25 bid.  - Continue digoxin 0.125 mg daily.  Check level today.   - Continue entresto 49-51 twice a day.  - No longer taking eplerenone, thinks her insurance would not cover it.  - Will add Bidil 1/2 tab tid.  2. HTN: BP stable.  3. ? H/O CAD: Cath in [place] with "blockage" but no PCI.  cMRI without MI-type scar. Cardiolite in 6/16 with no ischemia or infarction.  She has very atypical chest pain.  Continue ASA 81, check lipids.  4. OSA: Continue CPAP.  5. Multinodular goiter: TSH normal in 7/16.     Followup in 2 months.    Loralie Champagne 03/15/2015

## 2015-03-16 MED ORDER — ATORVASTATIN CALCIUM 20 MG PO TABS: 20.0000 mg | ORAL_TABLET | Freq: Every day | ORAL | Status: DC

## 2015-03-16 NOTE — Addendum Note (Signed)
Encounter addended by: Patton Salles, RN on: 03/16/2015  4:11 PM<BR>     Documentation filed: Orders

## 2015-03-17 ENCOUNTER — Other Ambulatory Visit: Payer: Self-pay | Admitting: Gastroenterology

## 2015-03-18 ENCOUNTER — Telehealth (HOSPITAL_COMMUNITY): Payer: Self-pay | Admitting: *Deleted

## 2015-03-18 ENCOUNTER — Encounter (HOSPITAL_COMMUNITY): Payer: Self-pay | Admitting: *Deleted

## 2015-03-18 NOTE — Telephone Encounter (Signed)
Susan Peck called to request clearance for pt to have a colonoscopy on 03/26/15 with Dr Benson Norway, fax note to her at [phone], will send to Dr Aundra Dubin to review

## 2015-03-21 NOTE — Telephone Encounter (Signed)
Stable, think ok for colonoscopy

## 2015-03-22 ENCOUNTER — Encounter (HOSPITAL_COMMUNITY): Payer: Self-pay

## 2015-03-22 NOTE — Progress Notes (Signed)
Med Rec request received by [facility] for records 01/22/15-03/01/15.  No records/visits available for that time period from our office at CHF clinic.  Notation made of this and fax returned, along with forms to [phone]. Copy of request scanned into medical records. Case # [id number]  [name]

## 2015-03-22 NOTE — Telephone Encounter (Signed)
Note faxed.

## 2015-03-24 ENCOUNTER — Encounter: Payer: Self-pay | Admitting: Endocrinology

## 2015-03-24 ENCOUNTER — Encounter (HOSPITAL_COMMUNITY): Payer: Self-pay | Admitting: Emergency Medicine

## 2015-03-24 ENCOUNTER — Encounter: Payer: Self-pay | Admitting: Licensed Clinical Social Worker

## 2015-03-24 ENCOUNTER — Ambulatory Visit (INDEPENDENT_AMBULATORY_CARE_PROVIDER_SITE_OTHER): Payer: 59 | Admitting: Endocrinology

## 2015-03-24 ENCOUNTER — Emergency Department (HOSPITAL_COMMUNITY)
Admission: EM | Admit: 2015-03-24 | Discharge: 2015-03-24 | Disposition: A | Discharge status: Home / Self Care | Payer: 59 | Attending: Emergency Medicine | Admitting: Emergency Medicine

## 2015-03-24 VITALS — Ht 63.75 in | Wt 198.8 lb

## 2015-03-24 DIAGNOSIS — R011 Cardiac murmur, unspecified: Secondary | ICD-10-CM | POA: Insufficient documentation

## 2015-03-24 DIAGNOSIS — Z8719 Personal history of other diseases of the digestive system: Secondary | ICD-10-CM | POA: Insufficient documentation

## 2015-03-24 DIAGNOSIS — Z7982 Long term (current) use of aspirin: Secondary | ICD-10-CM | POA: Diagnosis not present

## 2015-03-24 DIAGNOSIS — I5020 Unspecified systolic (congestive) heart failure: Secondary | ICD-10-CM | POA: Insufficient documentation

## 2015-03-24 DIAGNOSIS — E669 Obesity, unspecified: Secondary | ICD-10-CM | POA: Diagnosis not present

## 2015-03-24 DIAGNOSIS — Z8619 Personal history of other infectious and parasitic diseases: Secondary | ICD-10-CM | POA: Diagnosis not present

## 2015-03-24 DIAGNOSIS — Z79899 Other long term (current) drug therapy: Secondary | ICD-10-CM | POA: Insufficient documentation

## 2015-03-24 DIAGNOSIS — E119 Type 2 diabetes mellitus without complications: Secondary | ICD-10-CM | POA: Diagnosis not present

## 2015-03-24 DIAGNOSIS — Z9981 Dependence on supplemental oxygen: Secondary | ICD-10-CM | POA: Insufficient documentation

## 2015-03-24 DIAGNOSIS — J45909 Unspecified asthma, uncomplicated: Secondary | ICD-10-CM | POA: Insufficient documentation

## 2015-03-24 DIAGNOSIS — K047 Periapical abscess without sinus: Secondary | ICD-10-CM | POA: Diagnosis not present

## 2015-03-24 DIAGNOSIS — F419 Anxiety disorder, unspecified: Secondary | ICD-10-CM | POA: Diagnosis not present

## 2015-03-24 DIAGNOSIS — Z7951 Long term (current) use of inhaled steroids: Secondary | ICD-10-CM | POA: Diagnosis not present

## 2015-03-24 DIAGNOSIS — E042 Nontoxic multinodular goiter: Secondary | ICD-10-CM

## 2015-03-24 DIAGNOSIS — K088 Other specified disorders of teeth and supporting structures: Secondary | ICD-10-CM | POA: Diagnosis present

## 2015-03-24 DIAGNOSIS — G4730 Sleep apnea, unspecified: Secondary | ICD-10-CM | POA: Insufficient documentation

## 2015-03-24 DIAGNOSIS — I10 Essential (primary) hypertension: Secondary | ICD-10-CM | POA: Diagnosis not present

## 2015-03-24 DIAGNOSIS — F329 Major depressive disorder, single episode, unspecified: Secondary | ICD-10-CM | POA: Insufficient documentation

## 2015-03-24 MED ORDER — HYDROCODONE-ACETAMINOPHEN 5-325 MG PO TABS: 1.0000 | ORAL_TABLET | ORAL | Status: DC | PRN

## 2015-03-24 MED ORDER — LEVOTHYROXINE SODIUM 50 MCG PO TABS: 50.0000 ug | ORAL_TABLET | Freq: Every day | ORAL | Status: DC

## 2015-03-24 MED ORDER — HYDROCODONE-ACETAMINOPHEN 5-325 MG PO TABS
1.0000 | ORAL_TABLET | Freq: Once | ORAL | Status: AC
  Administered 2015-03-24: 1 via ORAL
  Filled 2015-03-24: qty 1

## 2015-03-24 MED ORDER — PENICILLIN V POTASSIUM 500 MG PO TABS: 500.0000 mg | ORAL_TABLET | Freq: Three times a day (TID) | ORAL | Status: DC

## 2015-03-24 NOTE — ED Provider Notes (Signed)
CSN: [id number]     Arrival date & time 03/24/15  2047 History  This chart was scribed for Charlann Lange, working with Debby Freiberg, MD by Steva Colder, ED Scribe. The patient was seen in room WTR7/WTR7 at 9:23 PM.    Chief Complaint  Patient presents with  . Dental Pain      The history is provided by the patient. No language interpreter was used.    Susan Peck is a 49 y.o. female who presents to the Emergency Department complaining of chronic dental pain onset 1 month. Pt reports that she has a couple bad teeth and that one of them have broken off. Pt notes that she has not called a dentist due to her not having insurance. She states that she has tried Anbesol and OTC gel with no relief for her symptoms. She denies gum swelling/bleeding, sore throat, fever, chills, facial swelling, and any other symptoms. Pt denies allergies to any medications. Pt reports that she has a colonoscopy to be completed by Dr. Janan Halter on Friday.    Past Medical History  Diagnosis Date  . Systolic heart failure     May 2011 EF 35-40%, 04/03/11 EF 25-30%, 06/27/11 EF 30-35%  . Hypertension   . Asthma   . Obesity   . GERD (gastroesophageal reflux disease)   . Heart murmur     dx 2 yrs ago per pt  . Seasonal allergies   . Shortness of breath     occasional - exercise induced  . Diabetes mellitus     recent dx 11/17/11 - started med 11/18/11  . Goiter 07/27/2011    non-neoplastic goiter - fine needle aspiration - benign  . Low back pain     history  . Anxiety     no meds  . Depression     no meds  . IBS (irritable bowel syndrome)     tx with diet per pt  . Herpes genitalis in women   . Sleep apnea     use cpap   Past Surgical History  Procedure Laterality Date  . Tubal ligation    . Diagnostic laparoscopy      of pelvis  . Novasure ablation      10/2005  . Dilation and curettage of uterus  12/2006,  10/2005    hysteroscopy surgery x 2  . Svd      x 3  . Wisdom tooth extraction    .  Colonscopy    . Cardiac catherization  2004    [place], [place] - Dr Lynnell Jude  . Laparoscopic assisted vaginal hysterectomy  11/23/2011    Procedure: LAPAROSCOPIC ASSISTED VAGINAL HYSTERECTOMY;  Surgeon: Jolayne Haines, MD;  Location: [facility];  Service: Gynecology;  Laterality: N/A;  . Cystoscopy  11/23/2011    Procedure: CYSTOSCOPY;  Surgeon: Jolayne Haines, MD;  Location: [facility];  Service: Gynecology;  Laterality: N/A;  . Salpingoophorectomy  11/23/2011    Procedure: SALPINGO OOPHERECTOMY;  Surgeon: Jolayne Haines, MD;  Location: [facility];  Service: Gynecology;  Laterality: Bilateral;  . Abdominal hysterectomy    . Cardiac catheterization     Family History  Problem Relation Age of Onset  . Heart disease Maternal Aunt   . Breast cancer Mother   . Thyroid disease Mother   . Hypertension Maternal Grandmother   . Breast cancer Maternal Aunt    Social History  Substance Use Topics  . Smoking status: Never Smoker   . Smokeless tobacco: Never Used  .  Alcohol Use: No   OB History    Gravida Para Term Preterm AB TAB SAB Ectopic Multiple Living   7 3   3  1   3      Review of Systems  Constitutional: Negative for fever and chills.  HENT: Positive for dental problem. Negative for facial swelling, rhinorrhea, sore throat and trouble swallowing.   Skin: Negative for color change, rash and wound.  Hematological: Does not bruise/bleed easily.      Allergies  Shrimp  Home Medications   Prior to Admission medications   Medication Sig Start Date End Date Taking? Authorizing Provider  albuterol (PROVENTIL HFA;VENTOLIN HFA) 108 (90 BASE) MCG/ACT inhaler Inhale 1-2 puffs into the lungs every 6 (six) hours as needed for wheezing or shortness of breath. 05/17/14   Kaitlyn Szekalski, PA-C  aspirin 81 MG chewable tablet Chew 81 mg by mouth every morning.     Historical Provider, MD  Aspirin-Acetaminophen-Caffeine (GOODY HEADACHE PO) Take 1 each by mouth daily as needed (headache/ tooth ache.).     Historical Provider, MD  atorvastatin (LIPITOR) 20 MG tablet Take 1 tablet (20 mg total) by mouth daily. Patient taking differently: Take 20 mg by mouth every evening.  03/16/15   Larey Dresser, MD  carvedilol (COREG) 25 MG tablet Take 1 tablet (25 mg total) by mouth 2 (two) times daily with a meal. 01/28/15   Larey Dresser, MD  digoxin (LANOXIN) 0.125 MG tablet Take 1 tablet (0.125 mg total) by mouth daily. 12/10/14   Amy D Clegg, NP  estrogen-methylTESTOSTERone 0.625-1.25 MG per tablet Take 1 tablet by mouth daily. 02/09/15   Terrance Mass, MD  fluticasone (FLONASE) 50 MCG/ACT nasal spray Place 1 spray into both nostrils 2 (two) times daily as needed for allergies or rhinitis.    Historical Provider, MD  furosemide (LASIX) 40 MG tablet Take 1 tablet (40 mg total) by mouth 2 (two) times daily. 01/06/15   Jolaine Artist, MD  gabapentin (NEURONTIN) 300 MG capsule Take 300 mg by mouth 3 (three) times daily.    Historical Provider, MD  glimepiride (AMARYL) 2 MG tablet Take 2 mg by mouth daily with breakfast.    Historical Provider, MD  hydrOXYzine (ATARAX/VISTARIL) 50 MG tablet Take 50 mg by mouth 3 (three) times daily as needed.    Historical Provider, MD  isosorbide-hydrALAZINE (BIDIL) 20-37.5 MG per tablet Take 0.5 tablets by mouth 3 (three) times daily. 03/15/15   Larey Dresser, MD  levothyroxine (SYNTHROID, LEVOTHROID) 50 MCG tablet Take 1 tablet (50 mcg total) by mouth daily. 03/24/15   Elayne Snare, MD  metFORMIN (GLUCOPHAGE) 500 MG tablet Take 500 mg by mouth 2 (two) times daily with a meal.    Historical Provider, MD  metoCLOPramide (REGLAN) 10 MG tablet Take 10 mg by mouth 4 (four) times daily.    Historical Provider, MD  Polyethyl Glycol-Propyl Glycol (SYSTANE) 0.4-0.3 % SOLN Apply 1 drop to eye 2 (two) times daily as needed (dry eyes).    Historical Provider, MD  polyethylene glycol powder (GLYCOLAX/MIRALAX) powder Take 255 g by mouth 2 (two) times daily. Until daily soft stools  OTC  05/17/14   Kaitlyn Szekalski, PA-C  sacubitril-valsartan (ENTRESTO) 49-51 MG Take 1 tablet by mouth 2 (two) times daily. 11/12/14   Jolaine Artist, MD  terconazole (TERAZOL 3) 0.8 % vaginal cream Place 1 applicator vaginally at bedtime. 01/06/15   Huel Cote, NP   BP 164/85 mmHg  Pulse 71  Temp(Src)  99 F (37.2 C) (Oral)  Resp 18  SpO2 94% Physical Exam  Constitutional: She is oriented to person, place, and time. She appears well-developed and well-nourished. No distress.  HENT:  Head: Normocephalic and atraumatic.  Widespread gingival disease. Tenderness of the upper right incisor without visualized abscess. Induration to the upper lip. No facial swelling otherwise.  Eyes: EOM are normal.  Neck: Neck supple. No tracheal deviation present.  Cardiovascular: Normal rate.   Pulmonary/Chest: Effort normal. No respiratory distress.  Musculoskeletal: Normal range of motion.  Neurological: She is alert and oriented to person, place, and time.  Skin: Skin is warm and dry.  Psychiatric: She has a normal mood and affect. Her behavior is normal.  Nursing note and vitals reviewed.   ED Course  Procedures (including critical care time) DIAGNOSTIC STUDIES: Oxygen Saturation is 94% on RA, adequate by my interpretation.    COORDINATION OF CARE: 9:26 PM Discussed treatment plan with pt at bedside and pt agreed to plan.    Labs Review Labs Reviewed - No data to display  Imaging Review No results found. Charlann Lange, PA-C, have personally reviewed and evaluated these images and lab results as part of my medical decision-making.    EKG Interpretation None      MDM   Final diagnoses:  None    1. Dental abscess 2. Dental pain  Patient is non-toxic, uncomfortable, with evidence upper incisor abscess. Abx prescribed but delayed as she is having a colonoscopy in 2 days and has been instructed not to take abx. She shoulder contact Dr. Benson Norway in the morning to discuss starting the  abx.   I personally performed the services described in this documentation, which was scribed in my presence. The recorded information has been reviewed and is accurate.     Charlann Lange, PA-C 03/24/15 2244  Debby Freiberg, MD 03/26/15 [phone]

## 2015-03-24 NOTE — Progress Notes (Signed)
Patient ID: Susan Peck, female   DOB: [birth year], 49 y.o.   MRN: [id number]           Reason for Appointment: Goiter, new consultation    History of Present Illness:   The patient's thyroid enlargement was first discovered in 2012, probably on a routine exam Her ultrasound examination in 2012 showed multiple nodules with the largest nodule 24 mm on the right side and 11 mm on the left  Thyroid biopsy was done on the dominant nodules of the right lobe in 07/2011 which showed nonneoplastic goiter on both the biopsies  She has had occasional mild difficulty with swallowing  Does  feel like she has a slight choking sensation in her neck when she is lying down on the right side and may have to turn her physician for this Has mild discomfort in her lower back but not significant and no pressure sensation  Her gynecologist on her recent routine exam recommended another ultrasound which showed the following in 8/16:  Right thyroid lobe:  Measurements: 5.5 cm x 2.9 cm x 3.0 cm. Superior right thyroid nodule measures 3.3 cm x 1.8 cm x 3.0 cm. More inferior solid right thyroid nodule measures 1.6 cm x 1.1 cm x 2.4 cm. Both of these have been biopsied previously.  Left thyroid lobe  Measurements: 4.8 cm x 2.0 cm x 2.2 cm. Small superior left thyroidnodule measures no greater than 8 mm.  Inferior left thyroid nodule measures 1.2 cm x 5 mm x 1.2 cm. No internal calcifications.  She has had thyroid functions periodically as follows:  Lab Results  Component Value Date   FREET4 1.24 07/10/2011   TSH 1.742 02/09/2015   TSH 1.347 12/10/2014   TSH 1.034 12/25/2013        Medication List       This list is accurate as of: 03/24/15  3:15 PM.  Always use your most recent med list.               albuterol 108 (90 BASE) MCG/ACT inhaler  Commonly known as:  PROVENTIL HFA;VENTOLIN HFA  Inhale 1-2 puffs into the lungs every 6 (six) hours as needed for wheezing or shortness of breath.      aspirin 81 MG chewable tablet  Chew 81 mg by mouth every morning.     atorvastatin 20 MG tablet  Commonly known as:  LIPITOR  Take 1 tablet (20 mg total) by mouth daily.     carvedilol 25 MG tablet  Commonly known as:  COREG  Take 1 tablet (25 mg total) by mouth 2 (two) times daily with a meal.     digoxin 0.125 MG tablet  Commonly known as:  LANOXIN  Take 1 tablet (0.125 mg total) by mouth daily.     estrogen-methylTESTOSTERone 0.625-1.25 MG per tablet  Take 1 tablet by mouth daily.     FLONASE 50 MCG/ACT nasal spray  Generic drug:  fluticasone  Place 1 spray into both nostrils 2 (two) times daily as needed for allergies or rhinitis.     furosemide 40 MG tablet  Commonly known as:  LASIX  Take 1 tablet (40 mg total) by mouth 2 (two) times daily.     gabapentin 300 MG capsule  Commonly known as:  NEURONTIN  Take 300 mg by mouth 3 (three) times daily.     glimepiride 2 MG tablet  Commonly known as:  AMARYL  Take 2 mg by mouth daily with breakfast.  GOODY HEADACHE PO  Take 1 each by mouth daily as needed (headache/ tooth ache.).     hydrOXYzine 50 MG tablet  Commonly known as:  ATARAX/VISTARIL  Take 50 mg by mouth 3 (three) times daily as needed.     isosorbide-hydrALAZINE 20-37.5 MG per tablet  Commonly known as:  BIDIL  Take 0.5 tablets by mouth 3 (three) times daily.     levothyroxine 50 MCG tablet  Commonly known as:  SYNTHROID, LEVOTHROID  Take 1 tablet (50 mcg total) by mouth daily.     metFORMIN 500 MG tablet  Commonly known as:  GLUCOPHAGE  Take 500 mg by mouth 2 (two) times daily with a meal.     metoCLOPramide 10 MG tablet  Commonly known as:  REGLAN  Take 10 mg by mouth 4 (four) times daily.     polyethylene glycol powder powder  Commonly known as:  GLYCOLAX/MIRALAX  Take 255 g by mouth 2 (two) times daily. Until daily soft stools  OTC     sacubitril-valsartan 49-51 MG  Commonly known as:  ENTRESTO  Take 1 tablet by mouth 2 (two) times  daily.     SYSTANE 0.4-0.3 % Soln  Generic drug:  Polyethyl Glycol-Propyl Glycol  Apply 1 drop to eye 2 (two) times daily as needed (dry eyes).     terconazole 0.8 % vaginal cream  Commonly known as:  TERAZOL 3  Place 1 applicator vaginally at bedtime.        Allergies:  Allergies  Allergen Reactions  . Shrimp [Shellfish Allergy] Anaphylaxis    Past Medical History  Diagnosis Date  . Systolic heart failure     May 2011 EF 35-40%, 04/03/11 EF 25-30%, 06/27/11 EF 30-35%  . Hypertension   . Asthma   . Obesity   . GERD (gastroesophageal reflux disease)   . Heart murmur     dx 2 yrs ago per pt  . Seasonal allergies   . Shortness of breath     occasional - exercise induced  . Diabetes mellitus     recent dx 11/17/11 - started med 11/18/11  . Goiter 07/27/2011    non-neoplastic goiter - fine needle aspiration - benign  . Low back pain     history  . Anxiety     no meds  . Depression     no meds  . IBS (irritable bowel syndrome)     tx with diet per pt  . Herpes genitalis in women   . Sleep apnea     use cpap    Past Surgical History  Procedure Laterality Date  . Tubal ligation    . Diagnostic laparoscopy      of pelvis  . Novasure ablation      10/2005  . Dilation and curettage of uterus  12/2006,  10/2005    hysteroscopy surgery x 2  . Svd      x 3  . Wisdom tooth extraction    . Colonscopy    . Cardiac catherization  2004    [place], [place] - Dr Lynnell Jude  . Laparoscopic assisted vaginal hysterectomy  11/23/2011    Procedure: LAPAROSCOPIC ASSISTED VAGINAL HYSTERECTOMY;  Surgeon: Jolayne Haines, MD;  Location: [facility];  Service: Gynecology;  Laterality: N/A;  . Cystoscopy  11/23/2011    Procedure: CYSTOSCOPY;  Surgeon: Jolayne Haines, MD;  Location: [facility];  Service: Gynecology;  Laterality: N/A;  . Salpingoophorectomy  11/23/2011    Procedure: SALPINGO OOPHERECTOMY;  Surgeon: Pierre Bali  Laurance Flatten, MD;  Location: [facility];  Service: Gynecology;  Laterality: Bilateral;  .  Abdominal hysterectomy    . Cardiac catheterization      Family History  Problem Relation Age of Onset  . Heart disease Maternal Aunt   . Breast cancer Mother   . Thyroid disease Mother   . Hypertension Maternal Grandmother   . Breast cancer Maternal Aunt     Social History:  reports that she has never smoked. She has never used smokeless tobacco. She reports that she does not drink alcohol or use illicit drugs.   Review of Systems:  Review of Systems  Constitutional: Negative for malaise/fatigue.  Respiratory: Negative for shortness of breath.   Cardiovascular: Positive for palpitations.       She feels her heartbeat when she is lying down at night  Neurological: Negative for tremors.   She has had diabetes followed by her PCP.  Her blood sugar levels have been higher and over 200 in her labs A1c not available She thinks she is trying to make some changes to improve control         Examination:   Ht 5' 3.75" (1.619 m)  Wt 198 lb 12.8 oz (90.175 kg)  BMI 34.40 kg/m2   General Appearance: pleasant, has mild generalized obesity         Eyes: No abnormal prominence or eyelid swelling.          Neck: The thyroid is enlarged mostly on the right side about 3 times normal, smooth and slightly firm.  Left lobe is enlarged about 1-1/2-2 times normal felt mostly on swallowing, slightly nodular Neck circumference is 40.5 cm over the thyroid There is no stridor. Pemberton sign is negative but patient feels a slight choking with the maneuver  There is no lymphadenopathy .    Cardiovascular: Normal  heart sounds, no murmur Respiratory:  Lungs clear Neurological: REFLEXES: at biceps are normal.  No tremor  Skin: no rash        Assessment/Plan:  Multinodular goiter, long-standing, euthyroid  She appears to have had an increase in size of her right-sided nodules with the largest nodule increasing by about 1 cm Also may have had some increase in local pressure symptoms recently,  mainly a feeling of choking when she is lying on the right side but no clear-cut dysphagia Left-sided nodule has not been increasing in size  Explained to the patient that since she has had benign results on biopsy of the 2 large nodules showing nonneoplastic goiter compatible with a long-standing benign multinodular goiter we do not need to repeat a biopsy  Although thyroid suppression usually does not work since she is showing a gradual increase in size may be worthwhile trying a 6-12 month course of thyroid suppression She is not a candidate for surgery at this time  Will start her on 50 g of levothyroxine and adjust the dose based on her TSH level in follow-up      [facility] 03/24/2015

## 2015-03-24 NOTE — ED Notes (Signed)
Pt c/o chronic dental pain x1 month. Reports 10/10 pain.

## 2015-03-24 NOTE — Progress Notes (Signed)
CSW met with patient in the clinic. Patient was denied Disability and asking for assistance with appeal process. Patient wrote a letter to disability outlining changes in her healthcare since denial and listed upcoming appointments. CSW discussed appeal process and provided support. Patient appears comfortable with appeal and will reach out to CSW if further needs arise. Jackie Brennan, LCSW [phone] 

## 2015-03-24 NOTE — Discharge Instructions (Signed)
Dental Abscess °A dental abscess is a collection of infected fluid (pus) from a bacterial infection in the inner part of the tooth (pulp). It usually occurs at the end of the tooth's root.  °CAUSES  °· Severe tooth decay. °· Trauma to the tooth that allows bacteria to enter into the pulp, such as a broken or chipped tooth. °SYMPTOMS  °· Severe pain in and around the infected tooth. °· Swelling and redness around the abscessed tooth or in the mouth or face. °· Tenderness. °· Pus drainage. °· Bad breath. °· Bitter taste in the mouth. °· Difficulty swallowing. °· Difficulty opening the mouth. °· Nausea. °· Vomiting. °· Chills. °· Swollen neck glands. °DIAGNOSIS  °· A medical and dental history will be taken. °· An examination will be performed by tapping on the abscessed tooth. °· X-rays may be taken of the tooth to identify the abscess. °TREATMENT °The goal of treatment is to eliminate the infection. You may be prescribed antibiotic medicine to stop the infection from spreading. A root canal may be performed to save the tooth. If the tooth cannot be saved, it may be pulled (extracted) and the abscess may be drained.  °HOME CARE INSTRUCTIONS °· Only take over-the-counter or prescription medicines for pain, fever, or discomfort as directed by your caregiver. °· Rinse your mouth (gargle) often with salt water (¼ tsp salt in 8 oz [250 ml] of warm water) to relieve pain or swelling. °· Do not drive after taking pain medicine (narcotics). °· Do not apply heat to the outside of your face. °· Return to your dentist for further treatment as directed. °SEEK MEDICAL CARE IF: °· Your pain is not helped by medicine. °· Your pain is getting worse instead of better. °SEEK IMMEDIATE MEDICAL CARE IF: °· You have a fever or persistent symptoms for more than 2-3 days. °· You have a fever and your symptoms suddenly get worse. °· You have chills or a very bad headache. °· You have problems breathing or swallowing. °· You have trouble  opening your mouth. °· You have swelling in the neck or around the eye. °Document Released: 07/10/2005 Document Revised: 04/03/2012 Document Reviewed: 10/18/2010 °ExitCare® Patient Information ©2015 [employer]. This information is not intended to replace advice given to you by your health care provider. Make sure you discuss any questions you have with your health care provider. ° °Emergency Department Resource Guide °1) Find a Doctor and Pay Out of Pocket °Although you won't have to find out who is covered by your insurance plan, it is a good idea to ask around and get recommendations. You will then need to call the office and see if the doctor you have chosen will accept you as a new patient and what types of options they offer for patients who are self-pay. Some doctors offer discounts or will set up payment plans for their patients who do not have insurance, but you will need to ask so you aren't surprised when you get to your appointment. ° °2) Contact Your Local Health Department °Not all health departments have doctors that can see patients for sick visits, but many do, so it is worth a call to see if yours does. If you don't know where your local health department is, you can check in your phone book. The CDC also has a tool to help you locate your state's health department, and many state websites also have listings of all of their local health departments. ° °3) Find a Walk-in Clinic °  If your illness is not likely to be very severe or complicated, you may want to try a walk in clinic. These are popping up all over the country in pharmacies, drugstores, and shopping centers. They're usually staffed by nurse practitioners or physician assistants that have been trained to treat common illnesses and complaints. They're usually fairly quick and inexpensive. However, if you have serious medical issues or chronic medical problems, these are probably not your best option. ° °No Primary Care Doctor: °- Call  [facility] at  [phone] - they can help you locate a primary care doctor that  accepts your insurance, provides certain services, etc. °- Physician Referral Service- 1-800-533-3463 ° °Chronic Pain Problems: °Organization         Address  Phone   Notes  °[facility]  ([phone]) [phone] Patients need to be referred by their primary care doctor.  ° °Medication Assistance: °Organization         Address  Phone   Notes  °[place] [facility] [address] °[place], [place] [address] ([phone]) 641-8030 --Must be a resident of [place] °-- Must have NO insurance coverage whatsoever (no Medicaid/ Medicare, etc.) °-- The pt. MUST have a primary care doctor that directs their care regularly and follows them in the community °  °[facility]  (866) 331-1348   °[employer]  (888) 892-1162   ° °Agencies that provide inexpensive medical care: °Organization         Address  Phone   Notes  °[facility]  ([phone]) [phone]   °[facility]    (336) 832-7272   °[facility] [address] °[place], [place] [address] (336) 832-4777   °[facility] [address], °[place] (336) [phone]   °[facility]    (336) 373-0678   °[facility]    (336) 272-1050   °[facility] ° [address], [place] Phone:  (336) [phone], Fax:  (336) [phone] Hours of Operation:  9 am - 6 pm, M-F.  Also accepts Medicaid/Medicare and self-pay.  °[facility] ° [address], [place] Phone: (336) [phone], Fax: ([phone]) [phone]. Hours of Operation:  8:30 am - 5:30 pm, M-F.  Also accepts Medicaid and self-pay.  °[facility] [place] [address], [place] Phone: ([phone]) [phone]   °[facility] [address], [place], [place] (336)[phone], Ext. 123 Mondays & Thursdays: 7-9 AM.  First 15 patients are seen on a first come, first serve  basis. °  ° °Medicaid-accepting [place] Providers: ° °Organization         Address  Phone   Notes  °[facility] [address], [place] ([phone]) [phone] Also accepts self-pay patients.  °[facility] [address], [place] ° (336) [phone]   °[facility] [address], [place] (336) [phone]   °[facility] [address] [place] [address], [place] (336) [phone]   °[facility] [address], [place]  ° ([phone]) [phone] Only accepts [place] Access Medicaid patients after they have their name applied to their card.  ° °Self-Pay (no insurance) in [place]: ° °Organization         Address  Phone   Notes  °Sickle Cell Patients, [facility] [address], [place] ([phone])   [phone]   °[facility] [address], [place] (336) [phone]   °[facility] ° [address], [place] (336) [phone]   °[facility]/Dr. Osei-Bonsu ° [address] [place] [address], [place] or [address], [place] ([phone]) [phone] Phone number for both [place] and [place] locations is the same.  °[facility] [address], [place] (336) [phone]   °[facility] [address] [place] [address], [place] or [address] (336) [phone] °(336) [phone]   °[facility] [address], [place] (336) 350-1642, phone; (336) [phone], fax Sees patients 1st and 3rd Saturday of every month.  Must not qualify for public or private insurance (i.e. Medicaid, Medicare, [place] Health Choice, Veterans' Benefits) • Household income should be no more than 200% of the poverty level •The clinic cannot treat you if you are pregnant or think you are pregnant • Sexually transmitted diseases are not treated at the clinic.  ° ° °Dental Care: °Organization         Address  Phone  Notes  °[place]  [facility] [address], [place] (336) [phone] Accepts children up to age 21 who are enrolled in Medicaid or [place] Health Choice; pregnant women with a Medicaid card; and children who have applied for Medicaid or [place] Health Choice, but were declined, whose parents can pay a reduced fee at time of service.  °[facility] [place]  [address], [place] (336) 641-7733 Accepts children up to age 21 who are enrolled in Medicaid or [place] Health Choice; pregnant women with a Medicaid card; and children who have applied for Medicaid or [place] Health Choice, but were declined, whose parents can pay a reduced fee at time of service.  °[facility] ° [address], [place] (336) [phone] Patients are seen by appointment only. Walk-ins are not accepted. [facility] will see patients 18 years of age and older. °Monday - Tuesday (8am-5pm) °Most Wednesdays (8:30-5pm) °$30 per visit, cash only  °[facility] ° [address], [place] ([phone]) [phone] Patients are seen by appointment only. Walk-ins are not accepted. [facility] will see patients 18 years of age and older. °One Wednesday Evening (Monthly: Volunteer Based).  $30 per visit, cash only  °[facility]  ([phone]) [phone] for adults; Children under age 4, call Graduate Pediatric Dentistry at ([phone]) [phone]. Children aged 4-14, please call ([phone]) [phone] to request a pediatric application. ° Dental services are provided in all areas of dental care including fillings, crowns and bridges, complete and partial dentures, implants, gum treatment, root canals, and extractions. Preventive care is also provided. Treatment is provided to both adults and children. °Patients are selected via a lottery and there is often a waiting list. °  °[facility] [address], °[place] ° (336) 763-8833  [email or web address] °  °[facility] [address], [place],  (336)723-1848, Ext. 123 Second and Fourth Thursday of each month, opens at 6:30 AM; Clinic ends at 9 AM.  Patients are seen on a first-come first-served basis, and a limited number are seen during each clinic.  ° °[facility] ° [address], [place],  (336) [phone]   Eligibility Requirements °You must have lived in [place], [place], or [place] counties for   at least the last three months. °  You cannot be eligible for state or federal sponsored healthcare insurance, including Veterans Administration, Medicaid, or Medicare. °  You generally cannot be eligible for healthcare insurance through your employer.  °  How to apply: °Eligibility screenings are held every Tuesday and Wednesday afternoon from 1:00 pm until 4:00 pm. You do not need an appointment for the interview!  °[facility] [address], [place], [place] [phone]   °[facility]  [phone]   °[facility]  [phone]   °[facility]  [phone]   ° °

## 2015-03-25 NOTE — H&P (Signed)
Susan Peck Found HPI: On 03/15/2007 the patient underwent a colonoscopy for lower abdominal pain with findings of a hyperplastic polyp. Recently she saw clay colored stool in July and it was noted to be heme positive. No reports of hematochezia or melena. She has a history of CHF and she reports a history of chest pain and SOB, however, these issues are not new.  Past Medical History  Diagnosis Date  . Systolic heart failure     May 2011 EF 35-40%, 04/03/11 EF 25-30%, 06/27/11 EF 30-35%  . Hypertension   . Asthma   . Obesity   . GERD (gastroesophageal reflux disease)   . Heart murmur     dx 2 yrs ago per pt  . Seasonal allergies   . Shortness of breath     occasional - exercise induced  . Diabetes mellitus     recent dx 11/17/11 - started med 11/18/11  . Goiter 07/27/2011    non-neoplastic goiter - fine needle aspiration - benign  . Low back pain     history  . Anxiety     no meds  . Depression     no meds  . IBS (irritable bowel syndrome)     tx with diet per pt  . Herpes genitalis in women   . Sleep apnea     use cpap    Past Surgical History  Procedure Laterality Date  . Tubal ligation    . Diagnostic laparoscopy      of pelvis  . Novasure ablation      10/2005  . Dilation and curettage of uterus  12/2006,  10/2005    hysteroscopy surgery x 2  . Svd      x 3  . Wisdom tooth extraction    . Colonscopy    . Cardiac catherization  2004    [place], [place] - Dr Lynnell Jude  . Laparoscopic assisted vaginal hysterectomy  11/23/2011    Procedure: LAPAROSCOPIC ASSISTED VAGINAL HYSTERECTOMY;  Surgeon: Jolayne Haines, MD;  Location: [facility];  Service: Gynecology;  Laterality: N/A;  . Cystoscopy  11/23/2011    Procedure: CYSTOSCOPY;  Surgeon: Jolayne Haines, MD;  Location: [facility];  Service: Gynecology;  Laterality: N/A;  . Salpingoophorectomy  11/23/2011    Procedure: SALPINGO OOPHERECTOMY;  Surgeon: Jolayne Haines, MD;  Location: [facility];  Service: Gynecology;  Laterality: Bilateral;  .  Abdominal hysterectomy    . Cardiac catheterization      Family History  Problem Relation Age of Onset  . Heart disease Maternal Aunt   . Breast cancer Mother   . Thyroid disease Mother   . Hypertension Maternal Grandmother   . Breast cancer Maternal Aunt     Social History:  reports that she has never smoked. She has never used smokeless tobacco. She reports that she does not drink alcohol or use illicit drugs.  Allergies:  Allergies  Allergen Reactions  . Shrimp [Shellfish Allergy] Anaphylaxis    Medications: Scheduled: Continuous:  No results found for this or any previous visit (from the past 24 hour(s)).   No results found.  ROS:  As stated above in the HPI otherwise negative.  There were no vitals taken for this visit.    PE: Gen: NAD, Alert and Oriented HEENT:  [place]/AT, EOMI Neck: Supple, no LAD Lungs: CTA Bilaterally CV: RRR without M/G/R ABM: Soft, NTND, +BS Ext: No C/C/E  Assessment/Plan: 1) Heme positive stool - Colonoscopy.  Saaya Procell D 03/25/2015, 7:39 AM

## 2015-03-25 NOTE — Progress Notes (Signed)
03-25-15 1000 Received call from pt-requesting review of AM meds on 03-26-15. Pt instructed to take with sips of water Carvedilol. Digoxin. Bidil. Levothyroxine. Reglan.Do not take any Diabetic meds AM of. Use inhaler if needed.May have  glass of Clear liquid 12 midnight to 0600 AM 03-26-15, then nothing except to take meds or follow bowel prep.

## 2015-03-26 ENCOUNTER — Ambulatory Visit (HOSPITAL_COMMUNITY): Payer: 59 | Admitting: Anesthesiology

## 2015-03-26 ENCOUNTER — Encounter (HOSPITAL_COMMUNITY)
Admission: RE | Disposition: A | Discharge status: Home / Self Care | Payer: Self-pay | Source: Ambulatory Visit | Attending: Gastroenterology

## 2015-03-26 ENCOUNTER — Ambulatory Visit (HOSPITAL_COMMUNITY)
Admission: RE | Admit: 2015-03-26 | Discharge: 2015-03-26 | Disposition: A | Discharge status: Home / Self Care | Payer: 59 | Source: Ambulatory Visit | Attending: Gastroenterology | Admitting: Gastroenterology

## 2015-03-26 ENCOUNTER — Encounter (HOSPITAL_COMMUNITY): Payer: Self-pay | Admitting: *Deleted

## 2015-03-26 DIAGNOSIS — I5020 Unspecified systolic (congestive) heart failure: Secondary | ICD-10-CM | POA: Diagnosis not present

## 2015-03-26 DIAGNOSIS — Z79899 Other long term (current) drug therapy: Secondary | ICD-10-CM | POA: Diagnosis not present

## 2015-03-26 DIAGNOSIS — G4730 Sleep apnea, unspecified: Secondary | ICD-10-CM | POA: Diagnosis not present

## 2015-03-26 DIAGNOSIS — I10 Essential (primary) hypertension: Secondary | ICD-10-CM | POA: Diagnosis not present

## 2015-03-26 DIAGNOSIS — J45909 Unspecified asthma, uncomplicated: Secondary | ICD-10-CM | POA: Diagnosis not present

## 2015-03-26 DIAGNOSIS — K5730 Diverticulosis of large intestine without perforation or abscess without bleeding: Secondary | ICD-10-CM | POA: Insufficient documentation

## 2015-03-26 DIAGNOSIS — K635 Polyp of colon: Secondary | ICD-10-CM | POA: Diagnosis not present

## 2015-03-26 DIAGNOSIS — K219 Gastro-esophageal reflux disease without esophagitis: Secondary | ICD-10-CM | POA: Diagnosis not present

## 2015-03-26 DIAGNOSIS — R195 Other fecal abnormalities: Secondary | ICD-10-CM | POA: Diagnosis present

## 2015-03-26 DIAGNOSIS — K589 Irritable bowel syndrome without diarrhea: Secondary | ICD-10-CM | POA: Insufficient documentation

## 2015-03-26 DIAGNOSIS — Z86010 Personal history of colonic polyps: Secondary | ICD-10-CM | POA: Diagnosis not present

## 2015-03-26 DIAGNOSIS — E119 Type 2 diabetes mellitus without complications: Secondary | ICD-10-CM | POA: Diagnosis not present

## 2015-03-26 HISTORY — PX: COLONOSCOPY WITH PROPOFOL: SHX5780

## 2015-03-26 LAB — GLUCOSE, CAPILLARY: GLUCOSE-CAPILLARY: 181 mg/dL — AB (ref 65–99)

## 2015-03-26 SURGERY — COLONOSCOPY WITH PROPOFOL
Anesthesia: Monitor Anesthesia Care

## 2015-03-26 MED ORDER — SODIUM CHLORIDE 0.9 % IV SOLN: INTRAVENOUS | Status: DC

## 2015-03-26 MED ORDER — LIDOCAINE HCL 1 % IJ SOLN
INTRAMUSCULAR | Status: DC | PRN
  Administered 2015-03-26: 70 mg via INTRADERMAL

## 2015-03-26 MED ORDER — PROPOFOL 10 MG/ML IV BOLUS
INTRAVENOUS | Status: AC
  Filled 2015-03-26: qty 20

## 2015-03-26 MED ORDER — MIDAZOLAM HCL 5 MG/5ML IJ SOLN
INTRAMUSCULAR | Status: DC | PRN
  Administered 2015-03-26 (×2): 1 mg via INTRAVENOUS

## 2015-03-26 MED ORDER — MIDAZOLAM HCL 2 MG/2ML IJ SOLN
INTRAMUSCULAR | Status: AC
  Filled 2015-03-26: qty 4

## 2015-03-26 MED ORDER — EPHEDRINE SULFATE 50 MG/ML IJ SOLN
INTRAMUSCULAR | Status: DC | PRN
  Administered 2015-03-26: 5 mg via INTRAVENOUS

## 2015-03-26 MED ORDER — LIDOCAINE HCL (CARDIAC) 20 MG/ML IV SOLN
INTRAVENOUS | Status: AC
  Filled 2015-03-26: qty 5

## 2015-03-26 MED ORDER — LACTATED RINGERS IV SOLN
INTRAVENOUS | Status: DC
  Administered 2015-03-26: 1000 mL via INTRAVENOUS

## 2015-03-26 MED ORDER — PROPOFOL 10 MG/ML IV BOLUS
INTRAVENOUS | Status: DC | PRN
  Administered 2015-03-26: 20 mg via INTRAVENOUS
  Administered 2015-03-26 (×2): 10 mg via INTRAVENOUS
  Administered 2015-03-26 (×6): 20 mg via INTRAVENOUS
  Administered 2015-03-26: 10 mg via INTRAVENOUS
  Administered 2015-03-26 (×2): 20 mg via INTRAVENOUS
  Administered 2015-03-26: 30 mg via INTRAVENOUS
  Administered 2015-03-26: 10 mg via INTRAVENOUS
  Administered 2015-03-26 (×2): 20 mg via INTRAVENOUS

## 2015-03-26 SURGICAL SUPPLY — 21 items

## 2015-03-26 NOTE — Anesthesia Preprocedure Evaluation (Signed)
Anesthesia Evaluation  Patient identified by MRN, date of birth, ID band Patient awake    Reviewed: Allergy & Precautions, H&P , NPO status , Patient's Chart, lab work & pertinent test results, reviewed documented beta blocker date and time   Airway Mallampati: III  TM Distance: >3 FB Neck ROM: full    Dental no notable dental hx. (+) Teeth Intact   Pulmonary shortness of breath and with exertion, asthma , sleep apnea and Continuous Positive Airway Pressure Ventilation ,  breath sounds clear to auscultation  Pulmonary exam normal       Cardiovascular hypertension, Pt. on medications and Pt. on home beta blockers +CHF + Valvular Problems/Murmurs MR and MVP Rhythm:regular Rate:Normal     Neuro/Psych PSYCHIATRIC DISORDERS Anxiety Depression negative neurological ROS     GI/Hepatic Neg liver ROS, GERD-  Medicated and Controlled,IBS   Endo/Other  diabetes, Well Controlled, Type 2, Oral Hypoglycemic AgentsMorbid obesity  Renal/GU negative Renal ROS  negative genitourinary   Musculoskeletal negative musculoskeletal ROS (+)   Abdominal Normal abdominal exam  (+)   Peds  Hematology negative hematology ROS (+)   Anesthesia Other Findings   Reproductive/Obstetrics negative OB ROS                             Anesthesia Physical  Anesthesia Plan  ASA: III  Anesthesia Plan: MAC   Post-op Pain Management:    Induction:   Airway Management Planned: Simple Face Mask  Additional Equipment:   Intra-op Plan:   Post-operative Plan:   Informed Consent: I have reviewed the patients History and Physical, chart, labs and discussed the procedure including the risks, benefits and alternatives for the proposed anesthesia with the patient or authorized representative who has indicated his/her understanding and acceptance.   Dental Advisory Given and Dental advisory given  Plan Discussed with:  Anesthesiologist, CRNA and Surgeon  Anesthesia Plan Comments:         Anesthesia Quick Evaluation

## 2015-03-26 NOTE — Op Note (Signed)
[facility] [place], [id number]   COLONOSCOPY PROCEDURE REPORT  PATIENT: Susan, Peck  MR#: [id number] BIRTHDATE: 06-Oct-1965 , 34  yrs. old GENDER: female ENDOSCOPIST: Carol Ada, MD REFERRED BY: PROCEDURE DATE:  Apr 03, 2015 PROCEDURE:   Colonoscopy with snare polypectomy ASA CLASS:   Class III INDICATIONS: Heme positive stool MEDICATIONS: Monitored anesthesia care  DESCRIPTION OF PROCEDURE:   After the risks and benefits and of the procedure were explained, informed consent was obtained.  revealed no abnormalities of the rectum.    The Pentax Ped Colon [id number] endoscope was introduced through the anus and advanced to the terminal ileum which was intubated for a short distance .  The quality of the prep was excellent. .  The instrument was then slowly withdrawn as the colon was fully examined. Estimated blood loss is zero unless otherwise noted in this procedure report.  FINDINGS: A small 3 mm sessile rectal polyp was removed with a cold snare.  A few scattered sigmoid diverticula were identified.  No evidence of any inflammation, ulcerations, erosions, masses, or vascular abnormalities.     Retroflexed views revealed no abnormalities.     The scope was then withdrawn from the patient and the procedure completed.  WITHDRAWAL TIME: 17 minutes  COMPLICATIONS: There were no immediate complications. ENDOSCOPIC IMPRESSION: 1) Rectal polyp. 2) Diverticula. RECOMMENDATIONS: 1) Follow up biopsies. 2) Repeat the colonoscopy in 5-10 years.  REPEAT EXAM:  cc:    _______________________________ eSignedCarol Ada, MD 2015-04-03 1:22 PM  CPT CODES: ICD CODES:  The ICD and CPT codes recommended by this software are interpretations from the data that the clinical staff has captured with the software.  The verification of the translation of this report to the ICD and CPT codes and modifiers is the sole responsibility of the health care  institution and practicing physician where this report was generated.  [employer] will not be held responsible for the validity of the ICD and CPT codes included on this report.  AMA assumes no liability for data contained or not contained herein. CPT is a Designer, television/film set of the [employer].

## 2015-03-26 NOTE — Transfer of Care (Signed)
Immediate Anesthesia Transfer of Care Note  Patient: Susan Peck  Procedure(s) Performed: Procedure(s): COLONOSCOPY WITH PROPOFOL (N/A)  Patient Location: PACU and Endoscopy Unit  Anesthesia Type:MAC  Level of Consciousness: awake, oriented and patient cooperative  Airway & Oxygen Therapy: Patient Spontanous Breathing and Patient connected to face mask oxygen  Post-op Assessment: Report given to RN and Post -op Vital signs reviewed and stable  Post vital signs: Reviewed and stable  Last Vitals:  Filed Vitals:   03/26/15 1325  BP: 97/46  Pulse: 58  Temp: 36.5 C  Resp: 19    Complications: No apparent anesthesia complications

## 2015-03-26 NOTE — Discharge Instructions (Signed)
Colonoscopy, Care After °Refer to this sheet in the next few weeks. These instructions provide you with information on caring for yourself after your procedure. Your health care provider may also give you more specific instructions. Your treatment has been planned according to current medical practices, but problems sometimes occur. Call your health care provider if you have any problems or questions after your procedure. °WHAT TO EXPECT AFTER THE PROCEDURE  °After your procedure, it is typical to have the following: °· A small amount of blood in your stool. °· Moderate amounts of gas and mild abdominal cramping or bloating. °HOME CARE INSTRUCTIONS °· Do not drive, operate machinery, or sign important documents for 24 hours. °· You may shower and resume your regular physical activities, but move at a slower pace for the first 24 hours. °· Take frequent rest periods for the first 24 hours. °· Walk around or put a warm pack on your abdomen to help reduce abdominal cramping and bloating. °· Drink enough fluids to keep your urine clear or pale yellow. °· You may resume your normal diet as instructed by your health care provider. Avoid heavy or fried foods that are hard to digest. °· Avoid drinking alcohol for 24 hours or as instructed by your health care provider. °· Only take over-the-counter or prescription medicines as directed by your health care provider. °· If a tissue sample (biopsy) was taken during your procedure: °¨ Do not take aspirin or blood thinners for 7 days, or as instructed by your health care provider. °¨ Do not drink alcohol for 7 days, or as instructed by your health care provider. °¨ Eat soft foods for the first 24 hours. °SEEK MEDICAL CARE IF: ° °You have persistent spotting of blood in your stool 2-3 days after the procedure. °SEEK IMMEDIATE MEDICAL CARE IF: °· You have more than a small spotting of blood in your stool. °· You pass large blood clots in your stool. °· Your abdomen is swollen  (distended). °· You have nausea or vomiting. °· You have a fever. °· You have increasing abdominal pain that is not relieved with medicine. °Document Released: 02/22/2004 Document Revised: 04/30/2013 Document Reviewed: 03/17/2013 °ExitCare® Patient Information ©2015 [employer]. This information is not intended to replace advice given to you by your health care provider. Make sure you discuss any questions you have with your health care provider. ° ° °Conscious Sedation °Sedation is the use of medicines to promote relaxation and relieve discomfort and anxiety. Conscious sedation is a type of sedation. Under conscious sedation you are less alert than normal but are still able to respond to instructions or stimulation. Conscious sedation is used during short medical and dental procedures. It is milder than deep sedation or general anesthesia and allows you to return to your regular activities sooner.  °LET YOUR HEALTH CARE PROVIDER KNOW ABOUT:  °· Any allergies you have. °· All medicines you are taking, including vitamins, herbs, eye drops, creams, and over-the-counter medicines. °· Use of steroids (by mouth or creams). °· Previous problems you or members of your family have had with the use of anesthetics. °· Any blood disorders you have. °· Previous surgeries you have had. °· Medical conditions you have. °· Possibility of pregnancy, if this applies. °· Use of cigarettes, alcohol, or illegal drugs. °RISKS AND COMPLICATIONS °Generally, this is a safe procedure. However, as with any procedure, problems can occur. Possible problems include: °· Oversedation. °· Trouble breathing on your own. You may need to have a breathing   tube until you are awake and breathing on your own. °· Allergic reaction to any of the medicines used for the procedure. °BEFORE THE PROCEDURE °· You may have blood tests done. These tests can help show how well your kidneys and liver are working. They can also show how well your blood clots. °· A  physical exam will be done.   °· Only take medicines as directed by your health care provider. You may need to stop taking medicines (such as blood thinners, aspirin, or nonsteroidal anti-inflammatory drugs) before the procedure.   °· Do not eat or drink at least 6 hours before the procedure or as directed by your health care provider. °· Arrange for a responsible adult, family member, or friend to take you home after the procedure. He or she should stay with you for at least 24 hours after the procedure, until the medicine has worn off. °PROCEDURE  °· An intravenous (IV) catheter will be inserted into one of your veins. Medicine will be able to flow directly into your body through this catheter. You may be given medicine through this tube to help prevent pain and help you relax. °· The medical or dental procedure will be done. °AFTER THE PROCEDURE °· You will stay in a recovery area until the medicine has worn off. Your blood pressure and pulse will be checked.   °·  Depending on the procedure you had, you may be allowed to go home when you can tolerate liquids and your pain is under control. °Document Released: 04/04/2001 Document Revised: 07/15/2013 Document Reviewed: 03/17/2013 °ExitCare® Patient Information ©2015 [employer], LLC. This information is not intended to replace advice given to you by your health care provider. Make sure you discuss any questions you have with your health care provider. ° °

## 2015-03-26 NOTE — Anesthesia Postprocedure Evaluation (Signed)
  Anesthesia Post-op Note  Patient: Susan Peck  Procedure(s) Performed: Procedure(s) (LRB): COLONOSCOPY WITH PROPOFOL (N/A)  Patient Location: PACU  Anesthesia Type: MAC  Level of Consciousness: awake and alert   Airway and Oxygen Therapy: Patient Spontanous Breathing  Post-op Pain: mild  Post-op Assessment: Post-op Vital signs reviewed, Patient's Cardiovascular Status Stable, Respiratory Function Stable, Patent Airway and No signs of Nausea or vomiting  Last Vitals:  Filed Vitals:   03/26/15 1400  BP: 125/69  Pulse: 63  Temp:   Resp: 25    Post-op Vital Signs: stable   Complications: No apparent anesthesia complications

## 2015-03-27 ENCOUNTER — Other Ambulatory Visit (HOSPITAL_COMMUNITY): Payer: Self-pay | Admitting: Internal Medicine

## 2015-03-28 ENCOUNTER — Encounter (HOSPITAL_COMMUNITY): Payer: Self-pay | Admitting: Gastroenterology

## 2015-03-31 ENCOUNTER — Ambulatory Visit (HOSPITAL_COMMUNITY): Admission: RE | Admit: 2015-03-31 | Payer: 59 | Source: Ambulatory Visit

## 2015-04-07 NOTE — Addendum Note (Signed)
Addendum  created 04/07/15 1149 by Montez Hageman, MD   Modules edited: Anesthesia Responsible Staff

## 2015-04-14 ENCOUNTER — Telehealth: Payer: Self-pay | Admitting: Licensed Clinical Social Worker

## 2015-04-14 NOTE — Telephone Encounter (Signed)
Patient called to inform CSW that she was denied by disability for the second time. Patient very frustrated with system and disability process. Patient states she is trying to get help from the [facility]. CSW encouraged patient to return to the [facility] and provide requested information for appeal and seek assistance of staff from [place]. CSW provided supportive counseling ans will be available as needed. Raquel Sarna, [place]

## 2015-04-20 ENCOUNTER — Ambulatory Visit (INDEPENDENT_AMBULATORY_CARE_PROVIDER_SITE_OTHER): Payer: 59 | Admitting: Gynecology

## 2015-04-20 ENCOUNTER — Encounter: Payer: Self-pay | Admitting: Gynecology

## 2015-04-20 VITALS — BP 132/90

## 2015-04-20 DIAGNOSIS — L298 Other pruritus: Secondary | ICD-10-CM

## 2015-04-20 DIAGNOSIS — N898 Other specified noninflammatory disorders of vagina: Secondary | ICD-10-CM

## 2015-04-20 LAB — WET PREP FOR TRICH, YEAST, CLUE
CLUE CELLS WET PREP: NONE SEEN
TRICH WET PREP: NONE SEEN

## 2015-04-20 MED ORDER — TERCONAZOLE 0.8 % VA CREA: 1.0000 | TOPICAL_CREAM | Freq: Every day | VAGINAL | Status: DC

## 2015-04-20 NOTE — Progress Notes (Signed)
   Patient is a 49 year old who presented to the office today complaining of the past few days of vulvar irritation and pruritus. No true discharge was reported. Patient with no GU or GI complaints. Late this summer she had a full STD screening which was negative. She denies any changes in sexual partner. She tried some MetroGel that she had at home with minimal relief. Patient had been on penicillin prescribed to her by her dentist recently.  Exam: External genitalia excoriated areas were noted speculum exam demonstrated thick white discharge  Wet prep moderate yeast  Assessment/plan: Yeast vulvovaginitis will be treated with Terazol 7 to apply daily at bedtime for one week.

## 2015-04-20 NOTE — Patient Instructions (Signed)

## 2015-05-03 ENCOUNTER — Other Ambulatory Visit (HOSPITAL_COMMUNITY): Payer: Self-pay | Admitting: Internal Medicine

## 2015-05-12 ENCOUNTER — Encounter (HOSPITAL_COMMUNITY): Payer: Self-pay | Admitting: *Deleted

## 2015-05-12 ENCOUNTER — Emergency Department (HOSPITAL_COMMUNITY): Payer: 59

## 2015-05-12 ENCOUNTER — Emergency Department (HOSPITAL_COMMUNITY)
Admission: EM | Admit: 2015-05-12 | Discharge: 2015-05-12 | Disposition: A | Discharge status: Home / Self Care | Payer: 59 | Attending: Emergency Medicine | Admitting: Emergency Medicine

## 2015-05-12 DIAGNOSIS — Z7984 Long term (current) use of oral hypoglycemic drugs: Secondary | ICD-10-CM | POA: Diagnosis not present

## 2015-05-12 DIAGNOSIS — I10 Essential (primary) hypertension: Secondary | ICD-10-CM | POA: Insufficient documentation

## 2015-05-12 DIAGNOSIS — Z7982 Long term (current) use of aspirin: Secondary | ICD-10-CM | POA: Diagnosis not present

## 2015-05-12 DIAGNOSIS — E669 Obesity, unspecified: Secondary | ICD-10-CM | POA: Insufficient documentation

## 2015-05-12 DIAGNOSIS — Z9981 Dependence on supplemental oxygen: Secondary | ICD-10-CM | POA: Diagnosis not present

## 2015-05-12 DIAGNOSIS — F329 Major depressive disorder, single episode, unspecified: Secondary | ICD-10-CM | POA: Diagnosis not present

## 2015-05-12 DIAGNOSIS — E119 Type 2 diabetes mellitus without complications: Secondary | ICD-10-CM | POA: Insufficient documentation

## 2015-05-12 DIAGNOSIS — Z9889 Other specified postprocedural states: Secondary | ICD-10-CM | POA: Diagnosis not present

## 2015-05-12 DIAGNOSIS — Z79818 Long term (current) use of other agents affecting estrogen receptors and estrogen levels: Secondary | ICD-10-CM | POA: Insufficient documentation

## 2015-05-12 DIAGNOSIS — Z793 Long term (current) use of hormonal contraceptives: Secondary | ICD-10-CM | POA: Diagnosis not present

## 2015-05-12 DIAGNOSIS — J45909 Unspecified asthma, uncomplicated: Secondary | ICD-10-CM | POA: Diagnosis not present

## 2015-05-12 DIAGNOSIS — Z79899 Other long term (current) drug therapy: Secondary | ICD-10-CM | POA: Insufficient documentation

## 2015-05-12 DIAGNOSIS — K219 Gastro-esophageal reflux disease without esophagitis: Secondary | ICD-10-CM | POA: Insufficient documentation

## 2015-05-12 DIAGNOSIS — G4730 Sleep apnea, unspecified: Secondary | ICD-10-CM | POA: Diagnosis not present

## 2015-05-12 DIAGNOSIS — R079 Chest pain, unspecified: Secondary | ICD-10-CM | POA: Diagnosis present

## 2015-05-12 DIAGNOSIS — Z8619 Personal history of other infectious and parasitic diseases: Secondary | ICD-10-CM | POA: Diagnosis not present

## 2015-05-12 DIAGNOSIS — I5020 Unspecified systolic (congestive) heart failure: Secondary | ICD-10-CM | POA: Insufficient documentation

## 2015-05-12 DIAGNOSIS — F419 Anxiety disorder, unspecified: Secondary | ICD-10-CM | POA: Diagnosis not present

## 2015-05-12 DIAGNOSIS — Z736 Limitation of activities due to disability: Secondary | ICD-10-CM

## 2015-05-12 LAB — BRAIN NATRIURETIC PEPTIDE: B NATRIURETIC PEPTIDE 5: 10.5 pg/mL (ref 0.0–100.0)

## 2015-05-12 LAB — COMPREHENSIVE METABOLIC PANEL
ALT: 27 U/L (ref 14–54)
AST: 26 U/L (ref 15–41)
Albumin: 3.7 g/dL (ref 3.5–5.0)
Alkaline Phosphatase: 104 U/L (ref 38–126)
Anion gap: 14 (ref 5–15)
BILIRUBIN TOTAL: 0.4 mg/dL (ref 0.3–1.2)
BUN: 9 mg/dL (ref 6–20)
CALCIUM: 9.6 mg/dL (ref 8.9–10.3)
CO2: 22 mmol/L (ref 22–32)
CREATININE: 0.57 mg/dL (ref 0.44–1.00)
Chloride: 103 mmol/L (ref 101–111)
Glucose, Bld: 186 mg/dL — ABNORMAL HIGH (ref 65–99)
Potassium: 3.9 mmol/L (ref 3.5–5.1)
Sodium: 139 mmol/L (ref 135–145)
TOTAL PROTEIN: 7.3 g/dL (ref 6.5–8.1)

## 2015-05-12 LAB — CBC WITH DIFFERENTIAL/PLATELET
BASOS ABS: 0 10*3/uL (ref 0.0–0.1)
Basophils Relative: 0 %
Eosinophils Absolute: 0.1 10*3/uL (ref 0.0–0.7)
Eosinophils Relative: 1 %
HEMATOCRIT: 38.1 % (ref 36.0–46.0)
Hemoglobin: 13.3 g/dL (ref 12.0–15.0)
LYMPHS ABS: 4.1 10*3/uL — AB (ref 0.7–4.0)
LYMPHS PCT: 47 %
MCH: 27.6 pg (ref 26.0–34.0)
MCHC: 34.9 g/dL (ref 30.0–36.0)
MCV: 79 fL (ref 78.0–100.0)
MONO ABS: 0.5 10*3/uL (ref 0.1–1.0)
Monocytes Relative: 6 %
NEUTROS ABS: 4.1 10*3/uL (ref 1.7–7.7)
Neutrophils Relative %: 46 %
Platelets: 258 10*3/uL (ref 150–400)
RBC: 4.82 MIL/uL (ref 3.87–5.11)
RDW: 13.4 % (ref 11.5–15.5)
WBC: 8.8 10*3/uL (ref 4.0–10.5)

## 2015-05-12 LAB — I-STAT TROPONIN, ED
Troponin i, poc: 0 ng/mL (ref 0.00–0.08)
Troponin i, poc: 0 ng/mL (ref 0.00–0.08)

## 2015-05-12 LAB — DIGOXIN LEVEL: Digoxin Level: <0.2 ng/mL — ABNORMAL LOW (ref 0.8–2.0)

## 2015-05-12 MED ORDER — HYDROCODONE-ACETAMINOPHEN 5-325 MG PO TABS: 1.0000 | ORAL_TABLET | Freq: Four times a day (QID) | ORAL | Status: DC | PRN

## 2015-05-12 MED ORDER — MORPHINE SULFATE (PF) 4 MG/ML IV SOLN
4.0000 mg | Freq: Once | INTRAVENOUS | Status: AC
  Administered 2015-05-12: 4 mg via INTRAVENOUS
  Filled 2015-05-12: qty 1

## 2015-05-12 NOTE — ED Notes (Signed)
Pt arrives from home via [facility]. 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 Pt states she's been having left sided CP with radiation to left arm and back for approx 3 hours. PTA pt received 324mg  of ASA and 1 nitro with no pain relief.

## 2015-05-12 NOTE — ED Provider Notes (Signed)
CSN: [id number]     Arrival date & time 05/12/15  1659 History   First MD Initiated Contact with Patient 05/12/15 1701     Chief Complaint  Patient presents with  . Chest Pain     (Consider location/radiation/quality/duration/timing/severity/associated sxs/prior Treatment) Patient is a 49 y.o. female presenting with chest pain. The history is provided by the patient (The patient states that she's had some left-sided chest pain today. Not worse with exertion.).  Chest Pain Pain location:  L chest Pain quality: aching   Pain radiates to:  Does not radiate Pain radiates to the back: no   Pain severity:  Moderate Onset quality:  Gradual Associated symptoms: no abdominal pain, no back pain, no cough, no fatigue and no headache     Past Medical History  Diagnosis Date  . Systolic heart failure     May 2011 EF 35-40%, 04/03/11 EF 25-30%, 06/27/11 EF 30-35%  . Hypertension   . Asthma   . Obesity   . GERD (gastroesophageal reflux disease)   . Heart murmur     dx 2 yrs ago per pt  . Seasonal allergies   . Shortness of breath     occasional - exercise induced  . Diabetes mellitus     recent dx 11/17/11 - started med 11/18/11  . Goiter 07/27/2011    non-neoplastic goiter - fine needle aspiration - benign  . Low back pain     history  . Anxiety     no meds  . Depression     no meds  . IBS (irritable bowel syndrome)     tx with diet per pt  . Herpes genitalis in women   . Sleep apnea     use cpap   Past Surgical History  Procedure Laterality Date  . Tubal ligation    . Diagnostic laparoscopy      of pelvis  . Novasure ablation      10/2005  . Dilation and curettage of uterus  12/2006,  10/2005    hysteroscopy surgery x 2  . Svd      x 3  . Wisdom tooth extraction    . Colonscopy    . Cardiac catherization  2004    [place], [place] - Dr Lynnell Jude  . Laparoscopic assisted vaginal hysterectomy  11/23/2011    Procedure: LAPAROSCOPIC ASSISTED VAGINAL HYSTERECTOMY;  Surgeon: Jolayne Haines, MD;  Location: [facility];  Service: Gynecology;  Laterality: N/A;  . Cystoscopy  11/23/2011    Procedure: CYSTOSCOPY;  Surgeon: Jolayne Haines, MD;  Location: [facility];  Service: Gynecology;  Laterality: N/A;  . Salpingoophorectomy  11/23/2011    Procedure: SALPINGO OOPHERECTOMY;  Surgeon: Jolayne Haines, MD;  Location: [facility];  Service: Gynecology;  Laterality: Bilateral;  . Abdominal hysterectomy    . Cardiac catheterization    . Colonoscopy with propofol N/A 03/26/2015    Procedure: COLONOSCOPY WITH PROPOFOL;  Surgeon: Carol Ada, MD;  Location: [facility];  Service: Endoscopy;  Laterality: N/A;   Family History  Problem Relation Age of Onset  . Heart disease Maternal Aunt   . Breast cancer Mother   . Thyroid disease Mother   . Hypertension Maternal Grandmother   . Breast cancer Maternal Aunt    Social History  Substance Use Topics  . Smoking status: Never Smoker   . Smokeless tobacco: Never Used  . Alcohol Use: No   OB History    Gravida Para Term Preterm AB TAB SAB Ectopic Multiple  Living   7 3   3  1   3      Review of Systems  Constitutional: Negative for appetite change and fatigue.  HENT: Negative for congestion, ear discharge and sinus pressure.   Eyes: Negative for discharge.  Respiratory: Negative for cough.   Cardiovascular: Positive for chest pain.  Gastrointestinal: Negative for abdominal pain and diarrhea.  Genitourinary: Negative for frequency and hematuria.  Musculoskeletal: Negative for back pain.  Skin: Negative for rash.  Neurological: Negative for seizures and headaches.  Psychiatric/Behavioral: Negative for hallucinations.      Allergies  Shrimp  Home Medications   Prior to Admission medications   Medication Sig Start Date End Date Taking? Authorizing Provider  albuterol (PROVENTIL HFA;VENTOLIN HFA) 108 (90 BASE) MCG/ACT inhaler Inhale 1-2 puffs into the lungs every 6 (six) hours as needed for wheezing or shortness of breath. 05/17/14  Yes  Kaitlyn Szekalski, PA-C  aspirin 81 MG chewable tablet Chew 81 mg by mouth every morning.    Yes Historical Provider, MD  Aspirin-Acetaminophen-Caffeine (GOODY HEADACHE PO) Take 1 each by mouth daily as needed (headache/ tooth ache.).   Yes Historical Provider, MD  atorvastatin (LIPITOR) 20 MG tablet Take 1 tablet (20 mg total) by mouth daily. Patient taking differently: Take 20 mg by mouth every evening.  03/16/15  Yes Larey Dresser, MD  carvedilol (COREG) 25 MG tablet Take 1 tablet (25 mg total) by mouth 2 (two) times daily with a meal. 01/28/15  Yes Larey Dresser, MD  digoxin (LANOXIN) 0.125 MG tablet Take 1 tablet (0.125 mg total) by mouth daily. 12/10/14  Yes Amy D Clegg, NP  ENTRESTO 49-51 MG TAKE ONE TABLET BY MOUTH TWICE DAILY 05/05/15  Yes Jolaine Artist, MD  estrogen-methylTESTOSTERone 0.625-1.25 MG per tablet Take 1 tablet by mouth daily. 02/09/15  Yes Terrance Mass, MD  fluticasone (FLONASE) 50 MCG/ACT nasal spray Place 1 spray into both nostrils 2 (two) times daily as needed for allergies or rhinitis.   Yes Historical Provider, MD  furosemide (LASIX) 40 MG tablet Take 1 tablet (40 mg total) by mouth 2 (two) times daily. 01/06/15  Yes Jolaine Artist, MD  gabapentin (NEURONTIN) 300 MG capsule Take 300 mg by mouth 3 (three) times daily.   Yes Historical Provider, MD  glimepiride (AMARYL) 2 MG tablet Take 2 mg by mouth daily with breakfast.   Yes Historical Provider, MD  hydrOXYzine (ATARAX/VISTARIL) 50 MG tablet Take 50 mg by mouth 3 (three) times daily as needed for anxiety.    Yes Historical Provider, MD  isosorbide-hydrALAZINE (BIDIL) 20-37.5 MG per tablet Take 0.5 tablets by mouth 3 (three) times daily. 03/15/15  Yes Larey Dresser, MD  levothyroxine (SYNTHROID, LEVOTHROID) 50 MCG tablet Take 1 tablet (50 mcg total) by mouth daily. 03/24/15  Yes Elayne Snare, MD  metFORMIN (GLUCOPHAGE) 500 MG tablet Take 500 mg by mouth 2 (two) times daily with a meal.   Yes Historical Provider,  MD  metoCLOPramide (REGLAN) 10 MG tablet Take 10 mg by mouth 4 (four) times daily.   Yes Historical Provider, MD  Polyethyl Glycol-Propyl Glycol (SYSTANE) 0.4-0.3 % SOLN Apply 1 drop to eye 2 (two) times daily as needed (dry eyes).   Yes Historical Provider, MD  HYDROcodone-acetaminophen (NORCO/VICODIN) 5-325 MG tablet Take 1 tablet by mouth every 6 (six) hours as needed for moderate pain. 05/12/15   Milton Ferguson, MD  penicillin v potassium (VEETID) 500 MG tablet Take 1 tablet (500 mg total) by mouth  3 (three) times daily. Patient not taking: Reported on 05/12/2015 03/24/15   Charlann Lange, PA-C  terconazole (TERAZOL 3) 0.8 % vaginal cream Place 1 applicator vaginally at bedtime. Patient not taking: Reported on 05/12/2015 04/20/15   Terrance Mass, MD   BP 158/83 mmHg  Pulse 68  Temp(Src) 97.9 F (36.6 C)  Resp 22  Ht 5\' 4"  (1.626 m)  Wt 194 lb (87.998 kg)  BMI 33.28 kg/m2  SpO2 96% Physical Exam  Constitutional: She is oriented to person, place, and time. She appears well-developed.  HENT:  Head: Normocephalic.  Eyes: Conjunctivae and EOM are normal. No scleral icterus.  Neck: Neck supple. No thyromegaly present.  Cardiovascular: Normal rate and regular rhythm.  Exam reveals no gallop and no friction rub.   No murmur heard. Pulmonary/Chest: No stridor. She has no wheezes. She has no rales. She exhibits no tenderness.  Abdominal: She exhibits no distension. There is no tenderness. There is no rebound.  Musculoskeletal: Normal range of motion. She exhibits no edema.  Lymphadenopathy:    She has no cervical adenopathy.  Neurological: She is oriented to person, place, and time. She exhibits normal muscle tone. Coordination normal.  Skin: No rash noted. No erythema.  Psychiatric: She has a normal mood and affect. Her behavior is normal.    ED Course  Procedures (including critical care time) Labs Review Labs Reviewed  CBC WITH DIFFERENTIAL/PLATELET - Abnormal; Notable for the  following:    Lymphs Abs 4.1 (*)    All other components within normal limits  COMPREHENSIVE METABOLIC PANEL - Abnormal; Notable for the following:    Glucose, Bld 186 (*)    All other components within normal limits  DIGOXIN LEVEL - Abnormal; Notable for the following:    Digoxin Level <0.2 (*)    All other components within normal limits  BRAIN NATRIURETIC PEPTIDE  I-STAT TROPOININ, ED  Randolm Idol, ED    Imaging Review Dg Chest Port 1 View  05/12/2015  CLINICAL DATA:  Left-sided chest pain EXAM: PORTABLE CHEST - 1 VIEW COMPARISON:  08/12/2014 FINDINGS: Cardiac shadow is within normal limits. The lungs are well aerated bilaterally. The previously seen small effusions and changes of congestive failure have resolved in the interval. The previously seen small granuloma is not well appreciated on this exam. IMPRESSION: No acute abnormality noted. Electronically Signed   By: Inez Catalina M.D.   On: 05/12/2015 17:47   I have personally reviewed and evaluated these images and lab results as part of my medical decision-making.   EKG Interpretation   Date/Time:  Wednesday May 12 2015 17:16:18 EDT Ventricular Rate:  67 PR Interval:  178 QRS Duration: 155 QT Interval:  439 QTC Calculation: 463 R Axis:   -75 Text Interpretation:  Sinus rhythm Left bundle branch block Confirmed by  Iker Nuttall  MD, Zharia Conrow ([id number]) on 05/12/2015 9:06:04 PM      MDM   Final diagnoses:  Chest pain at rest    Labs unremarkable and normal troponin. Strength normal. Patient has a history of non-ischemic cardiomyopathy. Pain resolved in the emergency department. Doubt this pain is related to coronary disease. Patient will follow-up with her cardiologist in a week    Milton Ferguson, MD 05/12/15 2112

## 2015-05-12 NOTE — Discharge Instructions (Signed)
Follow up with your cardiologist in a week.

## 2015-05-13 ENCOUNTER — Telehealth (HOSPITAL_COMMUNITY): Payer: Self-pay

## 2015-05-13 NOTE — Telephone Encounter (Signed)
In ED last night because of chest pain  Is to call and set up an appointment to see Dr. Haroldine Laws in one week.  Has an appointment November 8th  Would you like her to be seen earlier than November 8th?  Call her back.

## 2015-05-14 ENCOUNTER — Telehealth (HOSPITAL_COMMUNITY): Payer: Self-pay

## 2015-05-14 NOTE — Telephone Encounter (Signed)
Patient states that she has not had any discomfort since she left the ED.  Told her we would keep her appointment as planned on 06/01/2015.  If she has any problems concerns or questions prior to this asked her to call us.

## 2015-05-14 NOTE — Telephone Encounter (Signed)
Called pt to follow up. No answer left message.

## 2015-05-17 ENCOUNTER — Ambulatory Visit (HOSPITAL_COMMUNITY)
Admission: RE | Admit: 2015-05-17 | Discharge: 2015-05-17 | Disposition: A | Discharge status: Home / Self Care | Payer: 59 | Source: Ambulatory Visit | Attending: Internal Medicine | Admitting: Internal Medicine

## 2015-05-17 DIAGNOSIS — I5022 Chronic systolic (congestive) heart failure: Secondary | ICD-10-CM | POA: Diagnosis not present

## 2015-05-17 DIAGNOSIS — E042 Nontoxic multinodular goiter: Secondary | ICD-10-CM | POA: Insufficient documentation

## 2015-05-17 DIAGNOSIS — I10 Essential (primary) hypertension: Secondary | ICD-10-CM

## 2015-05-17 LAB — TSH: TSH: 0.768 u[IU]/mL (ref 0.350–4.500)

## 2015-05-17 LAB — T4, FREE: FREE T4: 1.33 ng/dL — AB (ref 0.61–1.12)

## 2015-05-17 NOTE — Patient Instructions (Signed)
Here for lab draw

## 2015-05-19 ENCOUNTER — Other Ambulatory Visit: Payer: 59

## 2015-05-24 ENCOUNTER — Ambulatory Visit: Payer: 59 | Admitting: Endocrinology

## 2015-05-27 ENCOUNTER — Telehealth (HOSPITAL_COMMUNITY): Payer: Self-pay | Admitting: *Deleted

## 2015-05-27 NOTE — Telephone Encounter (Signed)
Susan Peck was out seeing pt today and noticed her wt was up 4 lb from yesterday, pt did have swelling to LE.  Katie reports pt did not take Lasix yesterday b/c she forgot and she had not taken it yet today.  Katie advised pt importance of taking meds, pt will take now and call our office tomorrow if wt is not going down

## 2015-06-01 ENCOUNTER — Telehealth (HOSPITAL_COMMUNITY): Payer: Self-pay

## 2015-06-01 ENCOUNTER — Ambulatory Visit (HOSPITAL_COMMUNITY)
Admission: RE | Admit: 2015-06-01 | Discharge: 2015-06-01 | Disposition: A | Discharge status: Home / Self Care | Payer: 59 | Source: Ambulatory Visit | Attending: Internal Medicine | Admitting: Internal Medicine

## 2015-06-01 ENCOUNTER — Encounter (HOSPITAL_COMMUNITY): Payer: Self-pay | Admitting: *Deleted

## 2015-06-01 ENCOUNTER — Encounter (HOSPITAL_COMMUNITY): Payer: Self-pay | Admitting: Internal Medicine

## 2015-06-01 VITALS — BP 122/74 | HR 65 | Wt 192.5 lb

## 2015-06-01 DIAGNOSIS — F419 Anxiety disorder, unspecified: Secondary | ICD-10-CM | POA: Insufficient documentation

## 2015-06-01 DIAGNOSIS — G4733 Obstructive sleep apnea (adult) (pediatric): Secondary | ICD-10-CM | POA: Diagnosis not present

## 2015-06-01 DIAGNOSIS — Z7984 Long term (current) use of oral hypoglycemic drugs: Secondary | ICD-10-CM | POA: Diagnosis not present

## 2015-06-01 DIAGNOSIS — R002 Palpitations: Secondary | ICD-10-CM | POA: Diagnosis not present

## 2015-06-01 DIAGNOSIS — E119 Type 2 diabetes mellitus without complications: Secondary | ICD-10-CM | POA: Diagnosis not present

## 2015-06-01 DIAGNOSIS — F329 Major depressive disorder, single episode, unspecified: Secondary | ICD-10-CM | POA: Insufficient documentation

## 2015-06-01 DIAGNOSIS — K589 Irritable bowel syndrome without diarrhea: Secondary | ICD-10-CM | POA: Insufficient documentation

## 2015-06-01 DIAGNOSIS — K219 Gastro-esophageal reflux disease without esophagitis: Secondary | ICD-10-CM | POA: Insufficient documentation

## 2015-06-01 DIAGNOSIS — E669 Obesity, unspecified: Secondary | ICD-10-CM | POA: Insufficient documentation

## 2015-06-01 DIAGNOSIS — I10 Essential (primary) hypertension: Secondary | ICD-10-CM | POA: Diagnosis not present

## 2015-06-01 DIAGNOSIS — Z7982 Long term (current) use of aspirin: Secondary | ICD-10-CM | POA: Diagnosis not present

## 2015-06-01 DIAGNOSIS — R011 Cardiac murmur, unspecified: Secondary | ICD-10-CM | POA: Insufficient documentation

## 2015-06-01 DIAGNOSIS — I5022 Chronic systolic (congestive) heart failure: Secondary | ICD-10-CM | POA: Diagnosis not present

## 2015-06-01 DIAGNOSIS — J45909 Unspecified asthma, uncomplicated: Secondary | ICD-10-CM | POA: Insufficient documentation

## 2015-06-01 LAB — BASIC METABOLIC PANEL
ANION GAP: 10 (ref 5–15)
BUN: 9 mg/dL (ref 6–20)
CALCIUM: 9.3 mg/dL (ref 8.9–10.3)
CHLORIDE: 103 mmol/L (ref 101–111)
CO2: 25 mmol/L (ref 22–32)
CREATININE: 0.58 mg/dL (ref 0.44–1.00)
GFR calc non Af Amer: >60 mL/min (ref >60)
Glucose, Bld: 192 mg/dL — ABNORMAL HIGH (ref 65–99)
Potassium: 4 mmol/L (ref 3.5–5.1)
Sodium: 138 mmol/L (ref 135–145)

## 2015-06-01 MED ORDER — ATORVASTATIN CALCIUM 40 MG PO TABS: 40.0000 mg | ORAL_TABLET | Freq: Every day | ORAL | Status: DC

## 2015-06-01 MED ORDER — SACUBITRIL-VALSARTAN 97-103 MG PO TABS: 1.0000 | ORAL_TABLET | Freq: Two times a day (BID) | ORAL | Status: DC

## 2015-06-01 NOTE — Patient Instructions (Signed)
Increase Atorvastatin 40 mg daily  Increase Entresto to 97/103 mg Twice daily   Labs today  Your physician has requested that you have an echocardiogram. Echocardiography is a painless test that uses sound waves to create images of your heart. It provides your doctor with information about the size and shape of your heart and how well your heart's chambers and valves are working. This procedure takes approximately one hour. There are no restrictions for this procedure.  We will contact you in 2 months to schedule your next appointment.

## 2015-06-01 NOTE — Addendum Note (Signed)
Encounter addended by: Scarlette Calico, RN on: 06/01/2015 10:00 AM<BR>     Documentation filed: Medications, Patient Instructions Section, Dx Association, Orders

## 2015-06-01 NOTE — Telephone Encounter (Signed)
Dr. Haroldine Laws increased her atorvastatin and Entresto today The pharmacy is wanting to charge her for her medications that she has never been charged for before  Routed to [employer]

## 2015-06-01 NOTE — Progress Notes (Signed)
ADVANCED HF CLINIC NOTE  Patient ID: Susan Peck, female   DOB: 08/17/1965, 49 y.o.   MRN: [id number]  PCP: Dr Lin Landsman GYN: Dr Carolin Guernsey Pulmonologist: Dr Gwenette Greet  HPI:  Susan Peck is a 49 year old African American female with systolic heart failure, HTN, GERD, 11/23/11 S/P Hysterectomy, multinodular goiter and asthma.   2004 cardiac cath by Dr Lynnell Jude in [place], [place] with one blockage noted - no stent. EF said to be normal. About 1 year ago at [facility] noted murmur and she was referred to Dr Tamala Julian EF 25-30%. More recently, she has been followed in CHF clinic.   Here for routine f/u. At last visit added Bidil 1/2 tab tid. Having HAs with it but tolerating. Overall doing well. Had 5 pound weight gain last week. And Katie from [place] had her double lasix for a day and weight now back down. Still dyspneic with mild to moderate dyspnea with activity. Flat groun is ok but SOB with steps.  No orthopnea or PND. Compliant with meds. Getting tachy palpitations 2-3x/week for several hours. Cardiac monitor in 5/16 with PACs and PVCs  05/19/2011  Sleep study revealed moderate obstructive sleep apnea with desaturations noted.   04/03/2011  ECHO EF 25-30% 06/26/2012 ECHO EF 30-35%  02/29/2012 ECHO EF 35-40%  11/18/12 ECHO EF 55% Inferior wall mildly hypokinetic with septal HK Myoview 02/03/10 EF 42% No ischemia Septal and apical hypokinesis 11/14 Cardiac MRI  EF 52%- septal bounce suggestive of LBBB. Dyssynchrony from LBBB leads to the low calculated EF. Normal RV size and systolic function. No myocardial delayed enhancement, so no definite evidence for prior myocardial infarction, myocarditis, or infiltrative disease. 1/16 CT chest - small pleural effusions with early pulmonary edema. No ILD. 09/03/2014: ECHO EF 20-25%  6/16: ECHO EF 35-40% Cardiolite (6/16): EF 36%, no ischemia/infarction.   CPX 11/20/2014  Peak VO2: 16.1 (76.8% predicted peak VO2) - when corrected to ideal BW pVO2 is  22.9 VE/VCO2 slope: 28.5 OUES: 1.59 Peak RER: 1.18  Labs 08/19/14: K 4.1 Creatinine 0.62   Labs 10/28/2014: K 4.3 Creatinine 0.73  Labs 11/19/2014: K 3.6 Creatinine 0.64  Labs 6/16: digoxin 0.5 Labs 7/16: HIV negative, TSH normal  Lipids 8/16: TC 206, TG 133, HDL 29, LDL 150  ROS: All other systems normal except as mentioned in HPI, past medical history and problem list.    Past Medical History  Diagnosis Date  . Systolic heart failure     May 2011 EF 35-40%, 04/03/11 EF 25-30%, 06/27/11 EF 30-35%  . Hypertension   . Asthma   . Obesity   . GERD (gastroesophageal reflux disease)   . Heart murmur     dx 2 yrs ago per pt  . Seasonal allergies   . Shortness of breath     occasional - exercise induced  . Diabetes mellitus     recent dx 11/17/11 - started med 11/18/11  . Goiter 07/27/2011    non-neoplastic goiter - fine needle aspiration - benign  . Low back pain     history  . Anxiety     no meds  . Depression     no meds  . IBS (irritable bowel syndrome)     tx with diet per pt  . Herpes genitalis in women   . Sleep apnea     use cpap    Current Outpatient Prescriptions  Medication Sig Dispense Refill  . albuterol (PROVENTIL HFA;VENTOLIN HFA) 108 (90 BASE) MCG/ACT inhaler Inhale 1-2 puffs  into the lungs every 6 (six) hours as needed for wheezing or shortness of breath. 1 Inhaler 0  . aspirin 81 MG chewable tablet Chew 81 mg by mouth every morning.     . Aspirin-Acetaminophen-Caffeine (GOODY HEADACHE PO) Take 1 each by mouth daily as needed (headache/ tooth ache.).    Marland Kitchen atorvastatin (LIPITOR) 20 MG tablet Take 1 tablet (20 mg total) by mouth daily. (Patient taking differently: Take 20 mg by mouth every evening. ) 30 tablet 3  . carvedilol (COREG) 25 MG tablet Take 1 tablet (25 mg total) by mouth 2 (two) times daily with a meal. 60 tablet 6  . digoxin (LANOXIN) 0.125 MG tablet Take 1 tablet (0.125 mg total) by mouth daily. 30 tablet 6  . ENTRESTO 49-51 MG TAKE ONE TABLET BY  MOUTH TWICE DAILY 60 tablet 0  . estrogen-methylTESTOSTERone 0.625-1.25 MG per tablet Take 1 tablet by mouth daily. 30 tablet 5  . fluticasone (FLONASE) 50 MCG/ACT nasal spray Place 1 spray into both nostrils 2 (two) times daily as needed for allergies or rhinitis.    . furosemide (LASIX) 40 MG tablet Take 1 tablet (40 mg total) by mouth 2 (two) times daily. 60 tablet 3  . gabapentin (NEURONTIN) 300 MG capsule Take 300 mg by mouth 3 (three) times daily.    Marland Kitchen glimepiride (AMARYL) 2 MG tablet Take 2 mg by mouth daily with breakfast.    . HYDROcodone-acetaminophen (NORCO/VICODIN) 5-325 MG tablet Take 1 tablet by mouth every 6 (six) hours as needed for moderate pain. 10 tablet 0  . hydrOXYzine (ATARAX/VISTARIL) 50 MG tablet Take 50 mg by mouth 3 (three) times daily as needed for anxiety.     . isosorbide-hydrALAZINE (BIDIL) 20-37.5 MG per tablet Take 0.5 tablets by mouth 3 (three) times daily. 45 tablet 6  . levothyroxine (SYNTHROID, LEVOTHROID) 50 MCG tablet Take 1 tablet (50 mcg total) by mouth daily. 30 tablet 3  . metFORMIN (GLUCOPHAGE) 500 MG tablet Take 500 mg by mouth 2 (two) times daily with a meal.    . metoCLOPramide (REGLAN) 10 MG tablet Take 10 mg by mouth 4 (four) times daily.    Vladimir Faster Glycol-Propyl Glycol (SYSTANE) 0.4-0.3 % SOLN Apply 1 drop to eye 2 (two) times daily as needed (dry eyes).     No current facility-administered medications for this encounter.   Facility-Administered Medications Ordered in Other Encounters  Medication Dose Route Frequency Provider Last Rate Last Dose  . aminophylline injection 150 mg  150 mg Intravenous BID PRN Larey Dresser, MD   150 mg at 12/24/14 1005     Allergies  Allergen Reactions  . Shrimp [Shellfish Allergy] Anaphylaxis    Social History   Social History  . Marital Status: Legally Separated    Spouse Name: N/A  . Number of Children: Y  . Years of Education: N/A   Occupational History  . 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 Smoking status: Never Smoker   . Smokeless tobacco: Never Used  . Alcohol Use: No  . Drug Use: No  . Sexual Activity: Yes    Birth Control/ Protection: Surgical   Other Topics Concern  . Not on file   Social History Narrative   She lives with her husband and son.  She is works for [employer].    Family History  Problem Relation Age of Onset  . Heart disease Maternal Aunt   . Breast cancer Mother   .  Thyroid disease Mother   . Hypertension Maternal Grandmother   . Breast cancer Maternal Aunt     PHYSICAL EXAM: Filed Vitals:   06/01/15 0849  BP: 122/74  Pulse: 65    General:  Well appearing. No respiratory difficulty HEENT:  Normal Neck: supple. JVP 6-7 Carotids 2+ bilat; no bruits. No lymphadenopathy. +  nodular goiter Cor: PMI nondisplaced. Regular rate & rhythm. No rubs or murmurs.  Lungs: clear Abdomen: soft, nontender, obese, nondistended. No hepatosplenomegaly. No bruits or masses. Good bowel sounds. Extremities: no cyanosis, clubbing, rash., R and LLE trace edema.  Neuro: alert & oriented x 3, cranial nerves grossly intact. moves all 4 extremities w/o difficulty. Affect pleasant.  ASSESSMENT & PLAN: 1. Chronic systolic HF: Nonischemic cardiomyopathy.  Most recent echo in 6/16 with improved EF 35-40%, outside ICD range. NYHA class II-III symptoms, volume status stable. Painful gynecomastia with spironolactone.  - Continue lasix to 40 bid, check BMET today.   - On goal dose of carvedilol 25 bid.  - Continue digoxin 0.125 mg daily.  Check level today.   - Increase entresto 97-103 twice a day.  - No longer taking eplerenone, thinks her insurance would not cover it.  - Continue Bidil 1/2 tab tid. Take with tylenol to help with HAs - Repeat echo at next vist 2. HTN: BP stable.  3. ? H/O CAD: Cath in [place] with "blockage" but no PCI.  cMRI without MI-type scar. Cardiolite in 6/16 with no ischemia or infarction.   She has very atypical chest pain.  Continue ASA 81 4. OSA: Continue CPAP.  5. Multinodular goiter: TSH normal in 7/16.    6. Hyperlipidemia: - LDL up. HDL down. Needs exercise and weight loss. Increase atorva to 40 daily 7. Palpitations: - Monitor shows PVCs and PACs  Followup in 2 months.    Susan Peck, Quillian Quince 06/01/2015

## 2015-06-01 NOTE — Addendum Note (Signed)
Encounter addended by: Scarlette Calico, RN on: 06/01/2015 10:02 AM<BR>     Documentation filed: Medications, Patient Instructions Section, Dx Association, Orders

## 2015-06-02 NOTE — Telephone Encounter (Signed)
Patient wanted to ask Nira Conn who her letter needs to be addressed to and wanted to give what she needs off per week or month or disability

## 2015-06-07 ENCOUNTER — Encounter (HOSPITAL_COMMUNITY): Payer: Self-pay | Admitting: Internal Medicine

## 2015-06-08 ENCOUNTER — Telehealth (HOSPITAL_COMMUNITY): Payer: Self-pay | Admitting: *Deleted

## 2015-06-09 ENCOUNTER — Telehealth (HOSPITAL_COMMUNITY): Payer: Self-pay

## 2015-06-09 NOTE — Telephone Encounter (Signed)
Called patient and let her know the letter for work is completed and waiting for Dr. Haroldine Laws to return tomorrow to sign

## 2015-06-10 NOTE — Telephone Encounter (Signed)
Left VM for pt letter was ready for p/u

## 2015-06-16 ENCOUNTER — Other Ambulatory Visit (HOSPITAL_COMMUNITY): Payer: Self-pay | Admitting: Internal Medicine

## 2015-06-24 ENCOUNTER — Telehealth (HOSPITAL_COMMUNITY): Payer: Self-pay

## 2015-06-24 NOTE — Telephone Encounter (Signed)
Susan Peck, Paramedical reported that patient forgot to take medicatiopns on Tuesday and has not taken medications today. Joellen Jersey is going to work on a calender to help patient remember to take her medications BP this morning was 170/100 Will be out to check on her next week to check on compliance

## 2015-07-08 ENCOUNTER — Telehealth (HOSPITAL_COMMUNITY): Payer: Self-pay | Admitting: *Deleted

## 2015-07-08 NOTE — Telephone Encounter (Signed)
Katie called to let us know pt's BP was 136/94 today which is better for pt than it usually is.  She states pt has been complaint w/meds.  Joellen Jersey will see pt again next week and keep a check on her BP

## 2015-08-06 ENCOUNTER — Other Ambulatory Visit: Payer: Self-pay | Admitting: Gynecology

## 2015-08-06 MED ORDER — EST ESTROGENS-METHYLTEST 0.625-1.25 MG PO TABS: 1.0000 | ORAL_TABLET | Freq: Every day | ORAL | Status: DC

## 2015-08-09 ENCOUNTER — Other Ambulatory Visit: Payer: Self-pay

## 2015-08-12 ENCOUNTER — Telehealth: Payer: Self-pay | Admitting: *Deleted

## 2015-08-12 MED ORDER — ESTRADIOL 1 MG PO TABS
1.0000 mg | ORAL_TABLET | Freq: Every day | ORAL | Status: DC
Start: 2015-08-12 — End: 2017-03-13

## 2015-08-12 NOTE — Telephone Encounter (Signed)
Pt informed with the below note, Rx sent. 

## 2015-08-12 NOTE — Telephone Encounter (Signed)
Pt takes Estratest 0.625-1.25 mg tablet daily, she found out today that Rx is not longer covered by [place] healthcare. Pt asked if Rx could be switched to different medication. Please advise

## 2015-08-12 NOTE — Telephone Encounter (Signed)
Call in prescription for Estrace 1 mg one by mouth daily #30 with 11 refills

## 2015-08-17 ENCOUNTER — Encounter (HOSPITAL_COMMUNITY): Payer: Self-pay | Admitting: Pharmacist

## 2015-08-17 ENCOUNTER — Other Ambulatory Visit (HOSPITAL_COMMUNITY): Payer: Self-pay | Admitting: *Deleted

## 2015-08-17 MED ORDER — SACUBITRIL-VALSARTAN 97-103 MG PO TABS: 1.0000 | ORAL_TABLET | Freq: Two times a day (BID) | ORAL | Status: DC

## 2015-08-18 ENCOUNTER — Ambulatory Visit (INDEPENDENT_AMBULATORY_CARE_PROVIDER_SITE_OTHER): Payer: BLUE CROSS/BLUE SHIELD | Admitting: Women's Health

## 2015-08-18 ENCOUNTER — Encounter: Payer: Self-pay | Admitting: Women's Health

## 2015-08-18 VITALS — BP 138/80 | Ht 63.0 in | Wt 198.0 lb

## 2015-08-18 DIAGNOSIS — B3731 Acute candidiasis of vulva and vagina: Secondary | ICD-10-CM

## 2015-08-18 DIAGNOSIS — B373 Candidiasis of vulva and vagina: Secondary | ICD-10-CM

## 2015-08-18 DIAGNOSIS — E1110 Type 2 diabetes mellitus with ketoacidosis without coma: Secondary | ICD-10-CM

## 2015-08-18 DIAGNOSIS — R350 Frequency of micturition: Secondary | ICD-10-CM

## 2015-08-18 DIAGNOSIS — E1310 Other specified diabetes mellitus with ketoacidosis without coma: Secondary | ICD-10-CM | POA: Diagnosis not present

## 2015-08-18 LAB — WET PREP FOR TRICH, YEAST, CLUE
Clue Cells Wet Prep HPF POC: NONE SEEN
TRICH WET PREP: NONE SEEN
WBC WET PREP: NONE SEEN
Yeast Wet Prep HPF POC: NONE SEEN

## 2015-08-18 LAB — URINALYSIS W MICROSCOPIC + REFLEX CULTURE
BILIRUBIN URINE: NEGATIVE
CRYSTALS: NONE SEEN [HPF]
Casts: NONE SEEN [LPF]
GLUCOSE, UA: NEGATIVE
Nitrite: NEGATIVE
PH: 5 (ref 5.0–8.0)
Specific Gravity, Urine: 1.025 (ref 1.001–1.035)
Yeast: NONE SEEN [HPF]

## 2015-08-18 MED ORDER — FLUCONAZOLE 150 MG PO TABS: ORAL_TABLET | ORAL | Status: DC

## 2015-08-18 NOTE — Progress Notes (Signed)
Patient ID: Susan Peck, female   DOB: 1965/10/06, 50 y.o.   MRN: [id number] Presents with complaint of intense vaginal itching, irritation, white curdy discharge and vaginal dryness. History of TVH. HSV history rare outbreaks, none recent. Type 2 diabetes with poor control reports recent blood sugars in the 200-300 range. Currently seeing  primary care and the process of changing. Reports mild urinary frequency without pain or burning. Has used Diflucan in the past with good results.  Exam: Appears uncomfortable, external genitalia extremely erythematous, wet prep with Q-tip, negative. UA: +1 ketones, negative glucose, +1 blood, +1 leukocytes, 20-40 WBCs, 3-10 RBCs, few bacteria  Symptomatic yeast Type 2 diabetes-poor control  Plan: Diflucan 150 by mouth today repeat in 3 days. Instructed to call if no relief of vaginal itching. Urine culture pending. Reviewed importance of a low carb diet, increasing regular exercise. Will schedule referral for endocrinologist. Urge vaginal lubricant with intercourse.

## 2015-08-18 NOTE — Patient Instructions (Signed)
Monilial Vaginitis Vaginitis in a soreness, swelling and redness (inflammation) of the vagina and vulva. Monilial vaginitis is not a sexually transmitted infection. CAUSES  Yeast vaginitis is caused by yeast (candida) that is normally found in your vagina. With a yeast infection, the candida has overgrown in number to a point that upsets the chemical balance. SYMPTOMS   White, thick vaginal discharge.  Swelling, itching, redness and irritation of the vagina and possibly the lips of the vagina (vulva).  Burning or painful urination.  Painful intercourse. DIAGNOSIS  Things that may contribute to monilial vaginitis are:  Postmenopausal and virginal states.  Pregnancy.  Infections.  Being tired, sick or stressed, especially if you had monilial vaginitis in the past.  Diabetes. Good control will help lower the chance.  Birth control pills.  Tight fitting garments.  Using bubble bath, feminine sprays, douches or deodorant tampons.  Taking certain medications that kill germs (antibiotics).  Sporadic recurrence can occur if you become ill. TREATMENT  Your caregiver will give you medication.  There are several kinds of anti monilial vaginal creams and suppositories specific for monilial vaginitis. For recurrent yeast infections, use a suppository or cream in the vagina 2 times a week, or as directed.  Anti-monilial or steroid cream for the itching or irritation of the vulva may also be used. Get your caregiver's permission.  Painting the vagina with methylene blue solution may help if the monilial cream does not work.  Eating yogurt may help prevent monilial vaginitis. HOME CARE INSTRUCTIONS   Finish all medication as prescribed.  Do not have sex until treatment is completed or after your caregiver tells you it is okay.  Take warm sitz baths.  Do not douche.  Do not use tampons, especially scented ones.  Wear cotton underwear.  Avoid tight pants and panty  hose.  Tell your sexual partner that you have a yeast infection. They should go to their caregiver if they have symptoms such as mild rash or itching.  Your sexual partner should be treated as well if your infection is difficult to eliminate.  Practice safer sex. Use condoms.  Some vaginal medications cause latex condoms to fail. Vaginal medications that harm condoms are:  Cleocin cream.  Butoconazole (Femstat).  Terconazole (Terazol) vaginal suppository.  Miconazole (Monistat) (may be purchased over the counter). SEEK MEDICAL CARE IF:   You have a temperature by mouth above 102 F (38.9 C).  The infection is getting worse after 2 days of treatment.  The infection is not getting better after 3 days of treatment.  You develop blisters in or around your vagina.  You develop vaginal bleeding, and it is not your menstrual period.  You have pain when you urinate.  You develop intestinal problems.  You have pain with sexual intercourse.   This information is not intended to replace advice given to you by your health care provider. Make sure you discuss any questions you have with your health care provider.   Document Released: 04/19/2005 Document Revised: 10/02/2011 Document Reviewed: 01/11/2015 Elsevier Interactive Patient Education 2016 [place] for Diabetes Mellitus Carbohydrate counting is a method for keeping track of the amount of carbohydrates you eat. Eating carbohydrates naturally increases the level of sugar (glucose) in your blood, so it is important for you to know the amount that is okay for you to have in every meal. Carbohydrate counting helps keep the level of glucose in your blood within normal limits. The amount of carbohydrates allowed is different  for every person. A dietitian can help you calculate the amount that is right for you. Once you know the amount of carbohydrates you can have, you can count the carbohydrates in the  foods you want to eat. Carbohydrates are found in the following foods:  Grains, such as breads and cereals.  Dried beans and soy products.  Starchy vegetables, such as potatoes, peas, and corn.  Fruit and fruit juices.  Milk and yogurt.  Sweets and snack foods, such as cake, cookies, candy, chips, soft drinks, and fruit drinks. CARBOHYDRATE COUNTING There are two ways to count the carbohydrates in your food. You can use either of the methods or a combination of both. Reading the "Nutrition Facts" on [place] The "Nutrition Facts" is an area that is included on the labels of almost all packaged food and beverages in the [place]. It includes the serving size of that food or beverage and information about the nutrients in each serving of the food, including the grams (g) of carbohydrate per serving.  Decide the number of servings of this food or beverage that you will be able to eat or drink. Multiply that number of servings by the number of grams of carbohydrate that is listed on the label for that serving. The total will be the amount of carbohydrates you will be having when you eat or drink this food or beverage. Learning Standard Serving Sizes of Food When you eat food that is not packaged or does not include "Nutrition Facts" on the label, you need to measure the servings in order to count the amount of carbohydrates.A serving of most carbohydrate-rich foods contains about 15 g of carbohydrates. The following list includes serving sizes of carbohydrate-rich foods that provide 15 g ofcarbohydrate per serving:   1 slice of bread (1 oz) or 1 six-inch tortilla.    of a hamburger bun or English muffin.  4-6 crackers.   cup unsweetened dry cereal.    cup hot cereal.   cup rice or pasta.    cup mashed potatoes or  of a large baked potato.  1 cup fresh fruit or one small piece of fruit.    cup canned or frozen fruit or fruit juice.  1 cup milk.   cup plain  fat-free yogurt or yogurt sweetened with artificial sweeteners.   cup cooked dried beans or starchy vegetable, such as peas, corn, or potatoes.  Decide the number of standard-size servings that you will eat. Multiply that number of servings by 15 (the grams of carbohydrates in that serving). For example, if you eat 2 cups of strawberries, you will have eaten 2 servings and 30 g of carbohydrates (2 servings x 15 g = 30 g). For foods such as soups and casseroles, in which more than one food is mixed in, you will need to count the carbohydrates in each food that is included. EXAMPLE OF CARBOHYDRATE COUNTING Sample Dinner  3 oz chicken breast.   cup of brown rice.   cup of corn.  1 cup milk.   1 cup strawberries with sugar-free whipped topping.  Carbohydrate Calculation Step 1: Identify the foods that contain carbohydrates:   Rice.   Corn.   Milk.   Strawberries. Step 2:Calculate the number of servings eaten of each:   2 servings of rice.   1 serving of corn.   1 serving of milk.   1 serving of strawberries. Step 3: Multiply each of those number of servings by 15 g:  2 servings of rice x 15 g = 30 g.   1 serving of corn x 15 g = 15 g.   1 serving of milk x 15 g = 15 g.   1 serving of strawberries x 15 g = 15 g. Step 4: Add together all of the amounts to find the total grams of carbohydrates eaten: 30 g + 15 g + 15 g + 15 g = 75 g.   This information is not intended to replace advice given to you by your health care provider. Make sure you discuss any questions you have with your health care provider.   Document Released: 07/10/2005 Document Revised: 07/31/2014 Document Reviewed: 06/06/2013 Elsevier Interactive Patient Education [employer].

## 2015-08-19 ENCOUNTER — Telehealth: Payer: Self-pay | Admitting: *Deleted

## 2015-08-19 DIAGNOSIS — E1310 Other specified diabetes mellitus with ketoacidosis without coma: Secondary | ICD-10-CM

## 2015-08-19 LAB — HEMOGLOBIN A1C
Hgb A1c MFr Bld: 9.1 % — ABNORMAL HIGH (ref <5.7)
Mean Plasma Glucose: 214 mg/dL — ABNORMAL HIGH (ref <117)

## 2015-08-19 NOTE — Telephone Encounter (Signed)
-----   Message from Huel Cote, NP sent at 08/18/2015  2:59 PM EST ----- Needs referral to endocrinologist  DM 2 on meds poor control

## 2015-08-19 NOTE — Telephone Encounter (Signed)
Referral placed they will contact pt to schedule. 

## 2015-08-20 ENCOUNTER — Other Ambulatory Visit: Payer: Self-pay | Admitting: Women's Health

## 2015-08-20 ENCOUNTER — Telehealth (HOSPITAL_COMMUNITY): Payer: Self-pay | Admitting: Cardiology

## 2015-08-20 LAB — URINE CULTURE

## 2015-08-20 MED ORDER — CIPROFLOXACIN HCL 250 MG PO TABS: 250.0000 mg | ORAL_TABLET | Freq: Two times a day (BID) | ORAL | Status: DC

## 2015-08-20 NOTE — Telephone Encounter (Signed)
Patient called to inform office she is unable to get bidil or entresto  bidil-- PA completed and denied: appeal faxed on 08/17/15: awaiting response entresto- PAP application completed and awaiting patient portion for completion (finanicals)  Patient she reports she is at a zero income Unsure how this effects PAP application, will forward to PharmD to complete process

## 2015-08-23 NOTE — Telephone Encounter (Signed)
Once I receive signed Entresto assistance application, I will be able to submit to [employer]. I will notify them that patient currently has no income.   Ruta Hinds. Velva Harman, PharmD, BCPS, CPP Clinical Pharmacist Pager: [phone] Phone: [phone] 08/23/2015 10:06 AM

## 2015-08-23 NOTE — Telephone Encounter (Signed)
Appointment 09/08/15 @ 2:00pm with Dr.kumar

## 2015-08-24 ENCOUNTER — Telehealth (HOSPITAL_COMMUNITY): Payer: Self-pay | Admitting: Pharmacist

## 2015-08-24 NOTE — Telephone Encounter (Signed)
Bidil appeal approved by [place] through 07/23/2038. Verified with [facility] pharmacy that copay will be $80/mo. Patient may be able to use [facility] which has an agreement with the manufacturer that would likely reduce her cost. Spoke with Joellen Jersey (paramedic) who will hopefully see patient this week to discuss her options.   Ruta Hinds. Velva Harman, PharmD, BCPS, CPP Clinical Pharmacist Pager: [phone] Phone: [phone] 08/24/2015 2:30 PM

## 2015-08-26 ENCOUNTER — Other Ambulatory Visit (HOSPITAL_COMMUNITY): Payer: Self-pay | Admitting: Pharmacist

## 2015-08-26 ENCOUNTER — Encounter (HOSPITAL_COMMUNITY): Payer: Self-pay | Admitting: Pharmacist

## 2015-08-26 MED ORDER — ISOSORB DINITRATE-HYDRALAZINE 20-37.5 MG PO TABS: 0.5000 | ORAL_TABLET | Freq: Three times a day (TID) | ORAL | Status: DC

## 2015-08-26 NOTE — Telephone Encounter (Signed)
Sent Rx to [facility] who can fill her Bidil for $10/mo. Katie aware and will relay to patient.   Ruta Hinds. Velva Harman, PharmD, BCPS, CPP Clinical Pharmacist Pager: [phone] Phone: [phone] 08/26/2015 3:48 PM

## 2015-09-07 ENCOUNTER — Ambulatory Visit (HOSPITAL_BASED_OUTPATIENT_CLINIC_OR_DEPARTMENT_OTHER)
Admission: RE | Admit: 2015-09-07 | Discharge: 2015-09-07 | Disposition: A | Discharge status: Home / Self Care | Payer: BLUE CROSS/BLUE SHIELD | Source: Ambulatory Visit | Attending: Internal Medicine | Admitting: Internal Medicine

## 2015-09-07 ENCOUNTER — Encounter (HOSPITAL_COMMUNITY): Payer: Self-pay | Admitting: *Deleted

## 2015-09-07 ENCOUNTER — Ambulatory Visit (HOSPITAL_COMMUNITY)
Admission: RE | Admit: 2015-09-07 | Discharge: 2015-09-07 | Disposition: A | Discharge status: Home / Self Care | Payer: BLUE CROSS/BLUE SHIELD | Source: Ambulatory Visit | Attending: Internal Medicine | Admitting: Internal Medicine

## 2015-09-07 ENCOUNTER — Encounter (HOSPITAL_COMMUNITY): Payer: Self-pay | Admitting: Internal Medicine

## 2015-09-07 VITALS — BP 108/58 | HR 72 | Wt 186.8 lb

## 2015-09-07 DIAGNOSIS — I340 Nonrheumatic mitral (valve) insufficiency: Secondary | ICD-10-CM | POA: Diagnosis not present

## 2015-09-07 DIAGNOSIS — R0789 Other chest pain: Secondary | ICD-10-CM

## 2015-09-07 DIAGNOSIS — I5022 Chronic systolic (congestive) heart failure: Secondary | ICD-10-CM | POA: Insufficient documentation

## 2015-09-07 DIAGNOSIS — I5189 Other ill-defined heart diseases: Secondary | ICD-10-CM | POA: Diagnosis not present

## 2015-09-07 DIAGNOSIS — I10 Essential (primary) hypertension: Secondary | ICD-10-CM | POA: Diagnosis not present

## 2015-09-07 DIAGNOSIS — E119 Type 2 diabetes mellitus without complications: Secondary | ICD-10-CM | POA: Diagnosis not present

## 2015-09-07 LAB — BASIC METABOLIC PANEL
Anion gap: 11 (ref 5–15)
BUN: 14 mg/dL (ref 6–20)
CHLORIDE: 103 mmol/L (ref 101–111)
CO2: 24 mmol/L (ref 22–32)
Calcium: 9.4 mg/dL (ref 8.9–10.3)
Creatinine, Ser: 0.68 mg/dL (ref 0.44–1.00)
Glucose, Bld: 301 mg/dL — ABNORMAL HIGH (ref 65–99)
POTASSIUM: 4.4 mmol/L (ref 3.5–5.1)
SODIUM: 138 mmol/L (ref 135–145)

## 2015-09-07 LAB — CBC
HEMATOCRIT: 39.3 % (ref 36.0–46.0)
Hemoglobin: 13.1 g/dL (ref 12.0–15.0)
MCH: 26.8 pg (ref 26.0–34.0)
MCHC: 33.3 g/dL (ref 30.0–36.0)
MCV: 80.4 fL (ref 78.0–100.0)
PLATELETS: 255 10*3/uL (ref 150–400)
RBC: 4.89 MIL/uL (ref 3.87–5.11)
RDW: 12.8 % (ref 11.5–15.5)
WBC: 8.1 10*3/uL (ref 4.0–10.5)

## 2015-09-07 LAB — PROTIME-INR
INR: 1.08 (ref 0.00–1.49)
Prothrombin Time: 14.2 seconds (ref 11.6–15.2)

## 2015-09-07 MED ORDER — SACUBITRIL-VALSARTAN 24-26 MG PO TABS: 1.0000 | ORAL_TABLET | Freq: Two times a day (BID) | ORAL | Status: DC

## 2015-09-07 NOTE — Progress Notes (Signed)
Medication Samples have been provided to the patient.  Drug name: Alyson Reedy: 24/26 mg  LOT: [id number]  Exp.Date: 10/17  The patient has been instructed regarding the correct time, dose, and frequency of taking this medication, including desired effects and most common side effects.   Caelan Atchley 1:23 PM 09/07/2015

## 2015-09-07 NOTE — Patient Instructions (Signed)
Start Entresto 24/26 mg Twice daily   Labs today  Heart Catheterization on Wed 2/22, see instruction sheet  Your physician recommends that you schedule a follow-up appointment in: 4 weeks

## 2015-09-07 NOTE — Progress Notes (Signed)
Patient ID: HAYLEEN SHVARTS, female   DOB: 09/23/1965, 50 y.o.   MRN: [id number]  [facility] CLINIC NOTE  Patient ID: ROBENA MALONEY, female   DOB: 1965-11-10, 50 y.o.   MRN: [id number]  PCP: Dr Lin Landsman GYN: Dr Carolin Guernsey Pulmonologist: Dr Gwenette Greet  HPI:  Alasha is a 50 year old African American female with systolic heart failure, HTN, GERD, 11/23/11 S/P Hysterectomy, multinodular goiter and asthma.   2004 cardiac cath by Dr Lynnell Jude in [place], [place] with one blockage noted - no stent. EF said to be normal. About 1 year ago at [facility] noted murmur and she was referred to Dr Tamala Julian EF 25-30%. More recently, she has been followed in CHF clinic.   Here for routine f/u. At last visit in October Entresto increased to 97/103 bid. Was not able to get higher dose and says she hasn't taken Entresto since. Continues to follow with [facility]. Still taking other meds including Bidil, carvedilol, digoxin and lasix 40 bid. Weight down 12 pounds to 186. Breathing better. Remains with NYHA II-III symptoms. No orthopnea or PND. Has been getting left-sided chest pain. No clear relation to exertion.  Getting occasional palpitations. Cardiac monitor in 5/16 with PACs and PVCs. Using CPAP regularly.   Echo today reviewed personally. EF 35-40%  04/03/2011  ECHO EF 25-30% 06/26/2012 ECHO EF 30-35%  02/29/2012 ECHO EF 35-40%  11/18/12 ECHO EF 55% Inferior wall mildly hypokinetic with septal HK Myoview 02/03/10 EF 42% No ischemia Septal and apical hypokinesis 11/14 Cardiac MRI  EF 52%- septal bounce suggestive of LBBB. Dyssynchrony from LBBB leads to the low calculated EF. Normal RV size and systolic function. No myocardial delayed enhancement, so no definite evidence for prior myocardial infarction, myocarditis, or infiltrative disease. 1/16 CT chest - small pleural effusions with early pulmonary edema. No ILD. 09/03/2014: ECHO EF 20-25%  6/16: ECHO EF 35-40% Cardiolite (6/16): EF 36%, no ischemia/infarction.    CPX 11/20/2014  Peak VO2: 16.1 (76.8% predicted peak VO2) - when corrected to ideal BW pVO2 is 22.9 VE/VCO2 slope: 28.5 OUES: 1.59 Peak RER: 1.18  Labs 08/19/14: K 4.1 Creatinine 0.62   Labs 10/28/2014: K 4.3 Creatinine 0.73  Labs 11/19/2014: K 3.6 Creatinine 0.64  Labs 6/16: digoxin 0.5 Labs 7/16: HIV negative, TSH normal  Lipids 8/16: TC 206, TG 133, HDL 29, LDL 150  ROS: All other systems normal except as mentioned in HPI, past medical history and problem list.    Past Medical History  Diagnosis Date  . Systolic heart failure     May 2011 EF 35-40%, 04/03/11 EF 25-30%, 06/27/11 EF 30-35%  . Asthma   . Obesity   . GERD (gastroesophageal reflux disease)   . Heart murmur     dx 2 yrs ago per pt  . Seasonal allergies   . Shortness of breath     occasional - exercise induced  . Diabetes mellitus     recent dx 11/17/11 - started med 11/18/11  . Goiter 07/27/2011    non-neoplastic goiter - fine needle aspiration - benign  . Low back pain     history  . Anxiety     no meds  . Depression     no meds  . IBS (irritable bowel syndrome)     tx with diet per pt  . Herpes genitalis in women     Current Outpatient Prescriptions  Medication Sig Dispense Refill  . albuterol (PROVENTIL HFA;VENTOLIN HFA) 108 (90 BASE) MCG/ACT inhaler Inhale 1-2  puffs into the lungs every 6 (six) hours as needed for wheezing or shortness of breath. 1 Inhaler 0  . aspirin 81 MG chewable tablet Chew 81 mg by mouth every morning.     Marland Kitchen atorvastatin (LIPITOR) 40 MG tablet Take 1 tablet (40 mg total) by mouth daily. 30 tablet 6  . carvedilol (COREG) 25 MG tablet Take 1 tablet (25 mg total) by mouth 2 (two) times daily with a meal. 60 tablet 6  . digoxin (LANOXIN) 0.125 MG tablet Take 1 tablet (0.125 mg total) by mouth daily. 30 tablet 6  . estradiol (ESTRACE) 1 MG tablet Take 1 tablet (1 mg total) by mouth daily. 30 tablet 11  . fluticasone (FLONASE) 50 MCG/ACT nasal spray Place 1 spray into both nostrils 2  (two) times daily as needed for allergies or rhinitis.    . furosemide (LASIX) 40 MG tablet Take 1 tablet (40 mg total) by mouth 2 (two) times daily. 60 tablet 3  . gabapentin (NEURONTIN) 300 MG capsule Take 300 mg by mouth 3 (three) times daily.    Marland Kitchen glimepiride (AMARYL) 2 MG tablet Take 2 mg by mouth daily with breakfast.    . hydrOXYzine (ATARAX/VISTARIL) 50 MG tablet Take 50 mg by mouth 3 (three) times daily as needed for anxiety.     . isosorbide-hydrALAZINE (BIDIL) 20-37.5 MG tablet Take 0.5 tablets by mouth 3 (three) times daily. 45 tablet 6  . levothyroxine (SYNTHROID, LEVOTHROID) 50 MCG tablet Take 1 tablet (50 mcg total) by mouth daily. 30 tablet 3  . metFORMIN (GLUCOPHAGE) 500 MG tablet Take 500 mg by mouth 2 (two) times daily with a meal.    . metoCLOPramide (REGLAN) 10 MG tablet Take 10 mg by mouth 4 (four) times daily.    Vladimir Faster Glycol-Propyl Glycol (SYSTANE) 0.4-0.3 % SOLN Apply 1 drop to eye 2 (two) times daily as needed (dry eyes).     No current facility-administered medications for this encounter.   Facility-Administered Medications Ordered in Other Encounters  Medication Dose Route Frequency Provider Last Rate Last Dose  . aminophylline injection 150 mg  150 mg Intravenous BID PRN Larey Dresser, MD   150 mg at 12/24/14 1005     Allergies  Allergen Reactions  . Shrimp [Shellfish Allergy] Anaphylaxis    Social History   Social History  . Marital Status: Legally Separated    Spouse Name: N/A  . Number of Children: Y  . 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 Smoking status: Never Smoker   . Smokeless tobacco: Never Used  . Alcohol Use: No  . Drug Use: No  . Sexual Activity: Yes    Birth Control/ Protection: Surgical   Other Topics Concern  . Not on file   Social History Narrative   She lives with her husband and son.  She is works for [employer].    Family  History  Problem Relation Age of Onset  . Heart disease Maternal Aunt   . Breast cancer Mother   . Thyroid disease Mother   . Hypertension Maternal Grandmother   . Breast cancer Maternal Aunt     PHYSICAL EXAM: Filed Vitals:   09/07/15 1108  BP: 108/58  Pulse: 72    General:  Well appearing. No respiratory difficulty HEENT:  Normal Neck: supple. JVP 6-7 Carotids 2+ bilat; no bruits. No lymphadenopathy. +  nodular goiter Cor:  PMI nondisplaced. Regular rate & rhythm. No rubs or murmurs.  Lungs: clear Abdomen: soft, nontender, obese, nondistended. No hepatosplenomegaly. No bruits or masses. Good bowel sounds. Extremities: no cyanosis, clubbing, rash., R and LLE trace edema.  Neuro: alert & oriented x 3, cranial nerves grossly intact. moves all 4 extremities w/o difficulty. Affect pleasant.  ASSESSMENT & PLAN: 1. Chronic systolic HF: Nonischemic cardiomyopathy.  Ech today reviewed personally and EF 35-40%, outside ICD range (similar to cMRI). NYHA class II-III symptoms, volume status stable. Painful gynecomastia with spironolactone.  - Continue lasix to 40 bid, check BMET today.   - On goal dose of carvedilol 25 bid.  - Continue digoxin 0.125 mg daily.  Check level today.   - Has been out of Entresto. Paperwork in process with [facility] and PharmD (waiting on Ms. Lovena Le to complete her part of forms)  - No longer taking eplerenone, thinks her insurance would not cover it.  - Given persistent LV dysfunction, history of CAD on remote cath and recent L-sided chest pain I think it is time to proceed with R & L heart cath. We discussed this in detail and will proceed next week.  2. Chest pressure --As above, plan cath.  3. HTN: BP stable.  4. OSA: Continue CPAP.  5. Multinodular goiter: TSH normal in 7/16.    6. Hyperlipidemia: - LDL up. HDL down. Needs exercise and weight loss. Continue atorva 40 daily (recently increased) 7. Palpitations: - Monitor shows PVCs and PACs  Total  time spent 45 minutes. Over half that time spent discussing above.   Glori Bickers MD 09/07/2015

## 2015-09-07 NOTE — Progress Notes (Signed)
  Echocardiogram 2D Echocardiogram has been performed.  [name] 09/07/2015, 10:53 AM

## 2015-09-08 ENCOUNTER — Ambulatory Visit (INDEPENDENT_AMBULATORY_CARE_PROVIDER_SITE_OTHER): Payer: BLUE CROSS/BLUE SHIELD | Admitting: Endocrinology

## 2015-09-08 ENCOUNTER — Telehealth (HOSPITAL_COMMUNITY): Payer: Self-pay | Admitting: *Deleted

## 2015-09-08 ENCOUNTER — Encounter: Payer: Self-pay | Admitting: Endocrinology

## 2015-09-08 ENCOUNTER — Encounter: Payer: BLUE CROSS/BLUE SHIELD | Attending: Endocrinology | Admitting: Nutrition

## 2015-09-08 ENCOUNTER — Other Ambulatory Visit: Payer: Self-pay | Admitting: *Deleted

## 2015-09-08 ENCOUNTER — Other Ambulatory Visit (HOSPITAL_COMMUNITY): Payer: Self-pay | Admitting: *Deleted

## 2015-09-08 VITALS — BP 104/60 | HR 76 | Temp 98.4°F | Resp 16 | Ht 63.0 in | Wt 184.6 lb

## 2015-09-08 DIAGNOSIS — E1165 Type 2 diabetes mellitus with hyperglycemia: Secondary | ICD-10-CM | POA: Insufficient documentation

## 2015-09-08 DIAGNOSIS — E1142 Type 2 diabetes mellitus with diabetic polyneuropathy: Secondary | ICD-10-CM | POA: Diagnosis not present

## 2015-09-08 DIAGNOSIS — E1169 Type 2 diabetes mellitus with other specified complication: Secondary | ICD-10-CM

## 2015-09-08 DIAGNOSIS — I5022 Chronic systolic (congestive) heart failure: Secondary | ICD-10-CM

## 2015-09-08 DIAGNOSIS — R1310 Dysphagia, unspecified: Secondary | ICD-10-CM

## 2015-09-08 DIAGNOSIS — E785 Hyperlipidemia, unspecified: Secondary | ICD-10-CM | POA: Diagnosis not present

## 2015-09-08 DIAGNOSIS — E042 Nontoxic multinodular goiter: Secondary | ICD-10-CM

## 2015-09-08 DIAGNOSIS — IMO0002 Reserved for concepts with insufficient information to code with codable children: Secondary | ICD-10-CM

## 2015-09-08 MED ORDER — INSULIN PEN NEEDLE 32G X 5 MM MISC: Status: DC

## 2015-09-08 MED ORDER — VICTOZA 18 MG/3ML ~~LOC~~ SOPN: 1.2000 mg | PEN_INJECTOR | Freq: Every day | SUBCUTANEOUS | Status: DC

## 2015-09-08 MED ORDER — METFORMIN HCL ER 500 MG PO TB24: 2000.0000 mg | ORAL_TABLET | Freq: Every day | ORAL | Status: DC

## 2015-09-08 NOTE — Progress Notes (Signed)
Patient ID: Susan Peck, female   DOB: [birth year], 50 y.o.   MRN: [id number]           Reason for Appointment: Consultation for Type 2 Diabetes  Referring physician: Elon Alas  History of Present Illness:          Date of diagnosis of type 2 diabetes mellitus: 2013 ?        Background history:   She does not remember the circumstances of her diagnosis She thinks she has been mostly taking metformin and Amaryl for her treatment and usually managed by her PCP Sugars were <150 about a year or so ago with this management but details of her previous A1c results are not available Periodically she has had difficulty affording medications also  Recent history:     In 12/16 she was off medications for about 2 weeks because of change in insurance She thinks however that even with starting back on her metformin and Amaryl her blood sugars are much higher now She was seen by her gynecologist in 1/17 because of her significant vaginal candidiasis and high sugars Because of her glucose of around 300 she has been referred in for further management  Current blood sugar patterns and problems identified:  She checks her blood sugar only once a day.  These are mostly over 200, Highest 320  She is still taking metformin low-dose only 1000 mg a day, usually tolerating this well but the last 2 days has had diarrhea  She does not monitor her readings after meals  She gets thirsty and has some increased urination and will drink a lot of juice  She does not exercise at all  Non-insulin hypoglycemic drugs the patient is taking are: Metformin ER 500 mg twice a day, Amaryl 2 mg daily      Side effects from medications have been: None  Compliance with the medical regimen: Fair Hypoglycemia:   none  Glucose monitoring:  done 1  times a day         Glucometer: Contour      Self-care: The diet that the patient has been following is: tries to limit fried food and sweets .  Juice 3 cups   Meal  times 2-3 per day  Typical meal intake: Breakfast is cereal or eggs                Dietician visit, most recent:never               Exercise: none   Weight history:  Wt Readings from Last 3 Encounters:  09/08/15 184 lb 9.6 oz (83.734 kg)  09/07/15 186 lb 12 oz (84.709 kg)  08/18/15 198 lb (89.812 kg)    Glycemic control:   Lab Results  Component Value Date   HGBA1C 9.1* 08/18/2015   Lab Results  Component Value Date   LDLCALC 150* 03/15/2015   CREATININE 0.68 09/07/2015         Medication List       This list is accurate as of: 09/08/15  8:49 PM.  Always use your most recent med list.               albuterol 108 (90 Base) MCG/ACT inhaler  Commonly known as:  PROVENTIL HFA;VENTOLIN HFA  Inhale 1-2 puffs into the lungs every 6 (six) hours as needed for wheezing or shortness of breath.     aspirin 81 MG chewable tablet  Chew 81 mg by mouth every morning.  atorvastatin 40 MG tablet  Commonly known as:  LIPITOR  Take 1 tablet (40 mg total) by mouth daily.     carvedilol 25 MG tablet  Commonly known as:  COREG  Take 1 tablet (25 mg total) by mouth 2 (two) times daily with a meal.     digoxin 0.125 MG tablet  Commonly known as:  LANOXIN  Take 1 tablet (0.125 mg total) by mouth daily.     estradiol 1 MG tablet  Commonly known as:  ESTRACE  Take 1 tablet (1 mg total) by mouth daily.     FLONASE 50 MCG/ACT nasal spray  Generic drug:  fluticasone  Place 1 spray into both nostrils 2 (two) times daily as needed for allergies or rhinitis.     furosemide 40 MG tablet  Commonly known as:  LASIX  Take 1 tablet (40 mg total) by mouth 2 (two) times daily.     gabapentin 300 MG capsule  Commonly known as:  NEURONTIN  Take 300 mg by mouth 3 (three) times daily.     glimepiride 2 MG tablet  Commonly known as:  AMARYL  Take 2 mg by mouth daily with breakfast.     hydrOXYzine 50 MG tablet  Commonly known as:  ATARAX/VISTARIL  Take 50 mg by mouth 3 (three)  times daily as needed for anxiety.     Insulin Pen Needle 32G X 5 MM Misc  Commonly known as:  NOVOTWIST  Use one per day to inject Victoza     isosorbide-hydrALAZINE 20-37.5 MG tablet  Commonly known as:  BIDIL  Take 0.5 tablets by mouth 3 (three) times daily.     levothyroxine 50 MCG tablet  Commonly known as:  SYNTHROID, LEVOTHROID  Take 1 tablet (50 mcg total) by mouth daily.     metFORMIN 500 MG 24 hr tablet  Commonly known as:  GLUCOPHAGE-XR  Take 4 tablets (2,000 mg total) by mouth daily with supper.     metoCLOPramide 10 MG tablet  Commonly known as:  REGLAN  Take 10 mg by mouth 4 (four) times daily.     sacubitril-valsartan 24-26 MG  Commonly known as:  ENTRESTO  Take 1 tablet by mouth 2 (two) times daily.     SYSTANE 0.4-0.3 % Soln  Generic drug:  Polyethyl Glycol-Propyl Glycol  Apply 1 drop to eye 2 (two) times daily as needed (dry eyes).     VICTOZA 18 MG/3ML Sopn  Generic drug:  Liraglutide  Inject 0.2 mLs (1.2 mg total) into the skin daily. Inject once daily at the same time        Allergies:  Allergies  Allergen Reactions  . Shrimp [Shellfish Allergy] Anaphylaxis    Past Medical History  Diagnosis Date  . Systolic heart failure     May 2011 EF 35-40%, 04/03/11 EF 25-30%, 06/27/11 EF 30-35%  . Asthma   . Obesity   . GERD (gastroesophageal reflux disease)   . Heart murmur     dx 2 yrs ago per pt  . Seasonal allergies   . Shortness of breath     occasional - exercise induced  . Diabetes mellitus     recent dx 11/17/11 - started med 11/18/11  . Goiter 07/27/2011    non-neoplastic goiter - fine needle aspiration - benign  . Low back pain     history  . Anxiety     no meds  . Depression     no meds  . IBS (irritable bowel syndrome)  tx with diet per pt  . Herpes genitalis in women   . Hypertension     Past Surgical History  Procedure Laterality Date  . Tubal ligation    . Diagnostic laparoscopy      of pelvis  . Novasure ablation       10/2005  . Dilation and curettage of uterus  12/2006,  10/2005    hysteroscopy surgery x 2  . Svd      x 3  . Wisdom tooth extraction    . Colonscopy    . Cardiac catherization  2004    [place], [place] - Dr Lynnell Jude  . Laparoscopic assisted vaginal hysterectomy  11/23/2011    Procedure: LAPAROSCOPIC ASSISTED VAGINAL HYSTERECTOMY;  Surgeon: Jolayne Haines, MD;  Location: [facility];  Service: Gynecology;  Laterality: N/A;  . Cystoscopy  11/23/2011    Procedure: CYSTOSCOPY;  Surgeon: Jolayne Haines, MD;  Location: [facility];  Service: Gynecology;  Laterality: N/A;  . Salpingoophorectomy  11/23/2011    Procedure: SALPINGO OOPHERECTOMY;  Surgeon: Jolayne Haines, MD;  Location: [facility];  Service: Gynecology;  Laterality: Bilateral;  . Abdominal hysterectomy    . Cardiac catheterization    . Colonoscopy with propofol N/A 03/26/2015    Procedure: COLONOSCOPY WITH PROPOFOL;  Surgeon: Carol Ada, MD;  Location: [facility];  Service: Endoscopy;  Laterality: N/A;    Family History  Problem Relation Age of Onset  . Heart disease Maternal Aunt   . Breast cancer Mother   . Thyroid disease Mother   . Hypertension Maternal Grandmother   . Diabetes Maternal Grandmother   . Breast cancer Maternal Aunt   . Heart disease Maternal Aunt   . Diabetes Maternal Grandfather   . Diabetes Paternal Grandmother   . Diabetes Paternal Grandfather   . Hypertension Paternal Grandfather     Social History:  reports that she has never smoked. She has never used smokeless tobacco. She reports that she does not drink alcohol or use illicit drugs.    Review of Systems   GOITER thyroid enlargement was first discovered in 2012, probably on a routine exam  Her ultrasound examination in 2012 showed multiple nodules with the largest nodule 24 mm on the right side and 11 mm on the left The largest nodule was 3.3cm in 2016  She has had difficulty with swallowing at times, no certain foods  Does feel like she has a slight choking  sensation in her neck when she is lying down on the right side She thinks symptoms are more prominent recently with her swallowing difficulty  She was started on thyroid supplementation to help limit the size of her goiter but she does not think it is helping She did not come back for follow-up in October as directed    Lab Results  Component Value Date   TSH 0.768 05/17/2015   TSH 1.742 02/09/2015   TSH 1.347 12/10/2014   FREET4 1.33* 05/17/2015   FREET4 1.24 07/10/2011      Lipid history: on treatment with Lipitor but has not had any follow-up levels    Lab Results  Component Value Date   CHOL 206* 03/15/2015   HDL 29* 03/15/2015   LDLCALC 150* 03/15/2015   TRIG 133 03/15/2015   CHOLHDL 7.1 03/15/2015           Hypertension: Present but mostly on medications for her CHF  Most recent eye exam was years ago, cannot afford this   No results found for: HMDIABEYEEXA  Most recent foot exam: 08/2015  Review of Systems  Constitutional: Positive for weight loss.  HENT: Positive for trouble swallowing.   Eyes: Negative for blurred vision.  Respiratory: Positive for shortness of breath.   Cardiovascular: Positive for chest pain and leg swelling.  Gastrointestinal: Positive for diarrhea.       Diarrhea only for 2 days She  has been taking Reglan up to 4 times a day but she started this  for acid reflux.  No history of nausea or vomiting or early satiety She gets pain in the left upper abdomen and lower right rib cage especially on lying on that side   Endocrine: Positive for fatigue and polydipsia. Negative for light-headedness.       Hot flushes 8 yrs, significant and recently starting HRT from gynecologist  Genitourinary: Positive for nocturia.       Once hs Candida 1/17 and in 16   Neurological: Negative for weakness, numbness and tingling.       She has aching pains in her legs and burning not relieved by gabapentin taken twice a day  Psychiatric/Behavioral:  Negative for depressed mood.     LABS:  Hospital Outpatient Visit on 09/07/2015  Component Date Value Ref Range Status  . Sodium 09/07/2015 138  135 - 145 mmol/L Final  . Potassium 09/07/2015 4.4  3.5 - 5.1 mmol/L Final  . Chloride 09/07/2015 103  101 - 111 mmol/L Final  . CO2 09/07/2015 24  22 - 32 mmol/L Final  . Glucose, Bld 09/07/2015 301* 65 - 99 mg/dL Final  . BUN 09/07/2015 14  6 - 20 mg/dL Final  . Creatinine, Ser 09/07/2015 0.68  0.44 - 1.00 mg/dL Final  . Calcium 09/07/2015 9.4  8.9 - 10.3 mg/dL Final  . GFR calc non Af Amer 09/07/2015 >60  >60 mL/min Final  . GFR calc Af Amer 09/07/2015 >60  >60 mL/min Final   Comment: (NOTE) The eGFR has been calculated using the CKD EPI equation. This calculation has not been validated in all clinical situations. eGFR's persistently <60 mL/min signify possible Chronic Kidney Disease.   . Anion gap 09/07/2015 11  5 - 15 Final  . WBC 09/07/2015 8.1  4.0 - 10.5 K/uL Final  . RBC 09/07/2015 4.89  3.87 - 5.11 MIL/uL Final  . Hemoglobin 09/07/2015 13.1  12.0 - 15.0 g/dL Final  . HCT 09/07/2015 39.3  36.0 - 46.0 % Final  . MCV 09/07/2015 80.4  78.0 - 100.0 fL Final  . MCH 09/07/2015 26.8  26.0 - 34.0 pg Final  . MCHC 09/07/2015 33.3  30.0 - 36.0 g/dL Final  . RDW 09/07/2015 12.8  11.5 - 15.5 % Final  . Platelets 09/07/2015 255  150 - 400 K/uL Final  . Prothrombin Time 09/07/2015 14.2  11.6 - 15.2 seconds Final  . INR 09/07/2015 1.08  0.00 - 1.49 Final    Physical Examination:  BP 104/60 mmHg  Pulse 76  Temp(Src) 98.4 F (36.9 C)  Resp 16  Ht '5\' 3"'  (1.6 m)  Wt 184 lb 9.6 oz (83.734 kg)  BMI 32.71 kg/m2  SpO2 97%  GENERAL:         Patient has generalized obesity.   HEENT:         Eye exam shows normal external appearance. Fundus exam shows no retinopathy. Oral exam shows normal mucosa .  NECK:   There is no lymphadenopathy  The thyroid is enlarged mostly on the right side about 3 times normal, smooth  and slightly firm.  Left  lobe is enlarged about 1-1/2  normal felt mostly on swallowing, slightly nodular  Neck circumference is 39 cm over the thyroid  Pemberton sign is negative   Carotids are normal to palpation and no bruit heard LUNGS:         Chest is symmetrical. Lungs are clear to auscultation.Marland Kitchen   HEART:         Heart sounds:  S1 and S2 are normal. No murmur or click heard., no S3 or S4.   ABDOMEN:   There is no distention present. Liver and spleen are not palpable. No other mass or tenderness present.   NEUROLOGICAL:   Ankle jerks are absent bilaterally.    Diabetic Foot Exam - Simple   Simple Foot Form  Diabetic Foot exam was performed with the following findings:  Yes   Visual Inspection  No deformities, no ulcerations, no other skin breakdown bilaterally:  Yes  Sensation Testing  Intact to touch and monofilament testing bilaterally:  Yes  Pulse Check  Posterior Tibialis and Dorsalis pulse intact bilaterally:  Yes  Comments             Vibration sense is mildly reduced in distal first toes. MUSCULOSKELETAL:  There is no swelling or deformity of the peripheral joints. Spine is normal to inspection.   EXTREMITIES:     There is no edema. No skin lesions present.Marland Kitchen SKIN:       No rash or lesions of concern.        ASSESSMENT:  Diabetes type 2, uncontrolled     She has significant hypoglycemia recently without any benefit from her current regimen of metformin and low dose Amaryl She also has significant obesity Discussed that she has had progression of her diabetes and needs more aggressive treatment  She agrees to start Victoza as a GLP-1 drug Discussed with the patient the nature of GLP-1 drugs, the actions on various organ systems, how they benefit blood glucose control, as well as the benefit of weight loss and  increase satiety . Explained possible side effects especially nausea and vomiting initially; discussed safety information in package insert.  Described the injection technique and dosage  titration of Victoza  starting with 0.6 mg once a day at the same time for the first week and then increasing to 1.2 mg if no symptoms of nausea.  Educational brochure on Victoza and co-pay card given  Complications: Painful neuropathy: Not improved with low-dose gabapentin, unknown status of retinopathy or nephropathy   GOITER: Although she reports more significant local pressure symptoms and dysphagia her goiter appears to be smaller on exam today Discussed that she may be a candidate for surgery although we will need to determine objectively she has any abnormality of her barium swallow or CT scan documenting pressure effect of the goiter on the right side Also she may not be a good candidate for surgery because of her significant cardiac history She is also reluctant to undergo surgery at this time  HYPERLIPIDEMIA: Currently on treatment but no follow-up levels available  Other significant  problems including chronic CHF, left upper quadrant abdominal pain of unclear etiology   PLAN:     Start Victoza.  She was instructed by the nurse educator  Change metformin to metformin ER and increase the dose to 1500 mg a day Start monitoring postprandial blood sugars and bring monitor for download on each visit Reduce amounts of juice in diet Follow-up in 3 weeks Try taking 600  mg of gabapentin at bedtime for neuropathy Start regular exercise  Follow-up with PCP to evaluate left upper quadrant abdominal pain  Patient Instructions  Start VICTOZA injection as shown once daily at the same time of the day.   Dial the dose to 0.6 mg on the pen for the first week.  You may inject in the stomach, thigh or arm.  You may experience nausea in the first few days which usually goes away.  You will feel fullness of the stomach with starting the medication and should try to keep the portions at meals small.   After 1 week increase the dose to 1.58m daily if no nausea present.   If any questions or  concerns are present call the office or the [facility] at [phone] Visit [email or web address].[email or web address] more useful information  Check blood sugars on waking up 3-4  times a week Also check blood sugars about 2 hours after a meal and do this after different meals by rotation  Recommended blood sugar levels on waking up is 90-130 and about 2 hours after meal is 130-160  Please bring your blood sugar monitor to each visit, thank you  Walk daily  No juice  Gabapentin 2 at bedtime  Metformin ER 1 in am and 2 at dinner   Counseling time on subjects discussed above is over 50% of today's 60 minute visit   Susan Peck 09/08/2015, 8:49 PM   Note: This office note was prepared with Dragon voice recognition system technology. Any transcriptional errors that result from this process are unintentional.

## 2015-09-08 NOTE — Patient Instructions (Addendum)
Start VICTOZA injection as shown once daily at the same time of the day.   Dial the dose to 0.6 mg on the pen for the first week.  You may inject in the stomach, thigh or arm.  You may experience nausea in the first few days which usually goes away.  You will feel fullness of the stomach with starting the medication and should try to keep the portions at meals small.   After 1 week increase the dose to 1.2mg  daily if no nausea present.   If any questions or concerns are present call the office or the [facility] helpline at [phone]. Visit [email or web address].wall.info/ for more useful information  Check blood sugars on waking up 3-4  times a week Also check blood sugars about 2 hours after a meal and do this after different meals by rotation  Recommended blood sugar levels on waking up is 90-130 and about 2 hours after meal is 130-160  Please bring your blood sugar monitor to each visit, thank you  Walk daily  No juice  Gabapentin 2 at bedtime  Metformin ER 1 in am and 2 at dinner

## 2015-09-08 NOTE — Telephone Encounter (Signed)
No pre cert reqd for L&R heart cath 2/22

## 2015-09-09 ENCOUNTER — Other Ambulatory Visit (HOSPITAL_COMMUNITY): Payer: Self-pay | Admitting: *Deleted

## 2015-09-09 MED ORDER — SACUBITRIL-VALSARTAN 24-26 MG PO TABS: 1.0000 | ORAL_TABLET | Freq: Two times a day (BID) | ORAL | Status: DC

## 2015-09-13 ENCOUNTER — Telehealth (HOSPITAL_COMMUNITY): Payer: Self-pay | Admitting: *Deleted

## 2015-09-13 MED ORDER — SACUBITRIL-VALSARTAN 24-26 MG PO TABS: 1.0000 | ORAL_TABLET | Freq: Two times a day (BID) | ORAL | Status: DC

## 2015-09-13 NOTE — Telephone Encounter (Signed)
rx for entresto printed to be sent to [facility] pt assit.

## 2015-09-14 ENCOUNTER — Ambulatory Visit: Payer: BLUE CROSS/BLUE SHIELD | Admitting: Nutrition

## 2015-09-15 ENCOUNTER — Encounter (HOSPITAL_COMMUNITY)
Admission: RE | Disposition: A | Discharge status: Home / Self Care | Payer: Self-pay | Source: Ambulatory Visit | Attending: Internal Medicine

## 2015-09-15 ENCOUNTER — Ambulatory Visit (HOSPITAL_COMMUNITY)
Admission: RE | Admit: 2015-09-15 | Discharge: 2015-09-15 | Disposition: A | Discharge status: Home / Self Care | Payer: BLUE CROSS/BLUE SHIELD | Source: Ambulatory Visit | Attending: Internal Medicine | Admitting: Internal Medicine

## 2015-09-15 DIAGNOSIS — F329 Major depressive disorder, single episode, unspecified: Secondary | ICD-10-CM | POA: Insufficient documentation

## 2015-09-15 DIAGNOSIS — J45909 Unspecified asthma, uncomplicated: Secondary | ICD-10-CM | POA: Insufficient documentation

## 2015-09-15 DIAGNOSIS — Z91013 Allergy to seafood: Secondary | ICD-10-CM | POA: Insufficient documentation

## 2015-09-15 DIAGNOSIS — G4733 Obstructive sleep apnea (adult) (pediatric): Secondary | ICD-10-CM | POA: Insufficient documentation

## 2015-09-15 DIAGNOSIS — E785 Hyperlipidemia, unspecified: Secondary | ICD-10-CM | POA: Insufficient documentation

## 2015-09-15 DIAGNOSIS — F419 Anxiety disorder, unspecified: Secondary | ICD-10-CM | POA: Diagnosis not present

## 2015-09-15 DIAGNOSIS — R072 Precordial pain: Secondary | ICD-10-CM | POA: Insufficient documentation

## 2015-09-15 DIAGNOSIS — Z7982 Long term (current) use of aspirin: Secondary | ICD-10-CM | POA: Diagnosis not present

## 2015-09-15 DIAGNOSIS — Z6831 Body mass index (BMI) 31.0-31.9, adult: Secondary | ICD-10-CM | POA: Diagnosis not present

## 2015-09-15 DIAGNOSIS — R079 Chest pain, unspecified: Secondary | ICD-10-CM | POA: Diagnosis not present

## 2015-09-15 DIAGNOSIS — I5022 Chronic systolic (congestive) heart failure: Secondary | ICD-10-CM | POA: Diagnosis not present

## 2015-09-15 DIAGNOSIS — Z8249 Family history of ischemic heart disease and other diseases of the circulatory system: Secondary | ICD-10-CM | POA: Diagnosis not present

## 2015-09-15 DIAGNOSIS — Z7984 Long term (current) use of oral hypoglycemic drugs: Secondary | ICD-10-CM | POA: Insufficient documentation

## 2015-09-15 DIAGNOSIS — I110 Hypertensive heart disease with heart failure: Secondary | ICD-10-CM | POA: Insufficient documentation

## 2015-09-15 DIAGNOSIS — R011 Cardiac murmur, unspecified: Secondary | ICD-10-CM | POA: Diagnosis not present

## 2015-09-15 DIAGNOSIS — E669 Obesity, unspecified: Secondary | ICD-10-CM | POA: Diagnosis not present

## 2015-09-15 DIAGNOSIS — E119 Type 2 diabetes mellitus without complications: Secondary | ICD-10-CM | POA: Insufficient documentation

## 2015-09-15 DIAGNOSIS — K589 Irritable bowel syndrome without diarrhea: Secondary | ICD-10-CM | POA: Diagnosis not present

## 2015-09-15 DIAGNOSIS — E042 Nontoxic multinodular goiter: Secondary | ICD-10-CM | POA: Diagnosis not present

## 2015-09-15 DIAGNOSIS — I429 Cardiomyopathy, unspecified: Secondary | ICD-10-CM | POA: Insufficient documentation

## 2015-09-15 DIAGNOSIS — K219 Gastro-esophageal reflux disease without esophagitis: Secondary | ICD-10-CM | POA: Diagnosis not present

## 2015-09-15 DIAGNOSIS — M545 Low back pain: Secondary | ICD-10-CM | POA: Insufficient documentation

## 2015-09-15 HISTORY — PX: CARDIAC CATHETERIZATION: SHX172

## 2015-09-15 LAB — POCT I-STAT 3, VENOUS BLOOD GAS (G3P V)
Acid-Base Excess: 1 mmol/L (ref 0.0–2.0)
Bicarbonate: 25.1 mEq/L — ABNORMAL HIGH (ref 20.0–24.0)
Bicarbonate: 26.7 mEq/L — ABNORMAL HIGH (ref 20.0–24.0)
O2 SAT: 71 %
O2 SAT: 71 %
PCO2 VEN: 44.1 mmHg — AB (ref 45.0–50.0)
PCO2 VEN: 47.3 mmHg (ref 45.0–50.0)
PO2 VEN: 38 mmHg (ref 30.0–45.0)
TCO2: 28 mmol/L (ref 0–100)
TCO2: 26 mmol/L (ref 0–100)
pH, Ven: 7.363 — ABNORMAL HIGH (ref 7.250–7.300)
pH, Ven: 7.36 — ABNORMAL HIGH (ref 7.250–7.300)
pO2, Ven: 39 mmHg (ref 30.0–45.0)

## 2015-09-15 LAB — GLUCOSE, CAPILLARY
GLUCOSE-CAPILLARY: 295 mg/dL — AB (ref 65–99)
Glucose-Capillary: 284 mg/dL — ABNORMAL HIGH (ref 65–99)

## 2015-09-15 SURGERY — RIGHT/LEFT HEART CATH AND CORONARY ANGIOGRAPHY

## 2015-09-15 MED ORDER — HEPARIN (PORCINE) IN NACL 2-0.9 UNIT/ML-% IJ SOLN
INTRAMUSCULAR | Status: DC | PRN
  Administered 2015-09-15: 10 mL via INTRA_ARTERIAL

## 2015-09-15 MED ORDER — ASPIRIN 81 MG PO CHEW
81.0000 mg | CHEWABLE_TABLET | ORAL | Status: AC
  Administered 2015-09-15: 81 mg via ORAL

## 2015-09-15 MED ORDER — SODIUM CHLORIDE 0.9 % IV SOLN: INTRAVENOUS | Status: AC

## 2015-09-15 MED ORDER — SODIUM CHLORIDE 0.9 % IV SOLN: 250.0000 mL | INTRAVENOUS | Status: DC | PRN

## 2015-09-15 MED ORDER — FENTANYL CITRATE (PF) 100 MCG/2ML IJ SOLN
INTRAMUSCULAR | Status: DC | PRN
  Administered 2015-09-15: 25 ug via INTRAVENOUS

## 2015-09-15 MED ORDER — LIDOCAINE HCL (PF) 1 % IJ SOLN
INTRAMUSCULAR | Status: DC | PRN
  Administered 2015-09-15: 3 mL via SUBCUTANEOUS
  Administered 2015-09-15: 5 mL via SUBCUTANEOUS

## 2015-09-15 MED ORDER — MIDAZOLAM HCL 2 MG/2ML IJ SOLN
INTRAMUSCULAR | Status: AC
  Filled 2015-09-15: qty 2

## 2015-09-15 MED ORDER — IOHEXOL 350 MG/ML SOLN
INTRAVENOUS | Status: DC | PRN
  Administered 2015-09-15: 60 mL via INTRA_ARTERIAL

## 2015-09-15 MED ORDER — ONDANSETRON HCL 4 MG/2ML IJ SOLN: 4.0000 mg | Freq: Four times a day (QID) | INTRAMUSCULAR | Status: DC | PRN

## 2015-09-15 MED ORDER — MIDAZOLAM HCL 2 MG/2ML IJ SOLN
INTRAMUSCULAR | Status: DC | PRN
  Administered 2015-09-15: 2 mg via INTRAVENOUS
  Administered 2015-09-15: 1 mg via INTRAVENOUS

## 2015-09-15 MED ORDER — HEPARIN SODIUM (PORCINE) 1000 UNIT/ML IJ SOLN
INTRAMUSCULAR | Status: DC | PRN
  Administered 2015-09-15: 4000 [IU] via INTRAVENOUS

## 2015-09-15 MED ORDER — HEPARIN SODIUM (PORCINE) 1000 UNIT/ML IJ SOLN
INTRAMUSCULAR | Status: AC
  Filled 2015-09-15: qty 1

## 2015-09-15 MED ORDER — HEPARIN (PORCINE) IN NACL 2-0.9 UNIT/ML-% IJ SOLN
INTRAMUSCULAR | Status: AC
Start: 2015-09-15 — End: 2015-09-15
  Filled 2015-09-15: qty 1500

## 2015-09-15 MED ORDER — SODIUM CHLORIDE 0.9% FLUSH: 3.0000 mL | INTRAVENOUS | Status: DC | PRN

## 2015-09-15 MED ORDER — SODIUM CHLORIDE 0.9% FLUSH: 3.0000 mL | Freq: Two times a day (BID) | INTRAVENOUS | Status: DC

## 2015-09-15 MED ORDER — VERAPAMIL HCL 2.5 MG/ML IV SOLN
INTRAVENOUS | Status: AC
  Filled 2015-09-15: qty 2

## 2015-09-15 MED ORDER — LIDOCAINE HCL (PF) 1 % IJ SOLN
INTRAMUSCULAR | Status: AC
  Filled 2015-09-15: qty 30

## 2015-09-15 MED ORDER — ACETAMINOPHEN 325 MG PO TABS: 650.0000 mg | ORAL_TABLET | ORAL | Status: DC | PRN

## 2015-09-15 MED ORDER — HEPARIN (PORCINE) IN NACL 2-0.9 UNIT/ML-% IJ SOLN
INTRAMUSCULAR | Status: DC | PRN
  Administered 2015-09-15: 1500 mL

## 2015-09-15 MED ORDER — FENTANYL CITRATE (PF) 100 MCG/2ML IJ SOLN
INTRAMUSCULAR | Status: AC
  Filled 2015-09-15: qty 2

## 2015-09-15 MED ORDER — SODIUM CHLORIDE 0.9 % IV SOLN
INTRAVENOUS | Status: DC
  Administered 2015-09-15: 08:00:00 via INTRAVENOUS

## 2015-09-15 SURGICAL SUPPLY — 14 items
CATH BALLN WEDGE 5F 110CM (CATHETERS) ×2
CATH INFINITI 5 FR JL3.5 (CATHETERS) ×2
CATH INFINITI 5FR ANG PIGTAIL (CATHETERS) ×2
CATH INFINITI JR4 5F (CATHETERS) ×4
DEVICE RAD COMP TR BAND LRG (VASCULAR PRODUCTS) ×2
GLIDESHEATH SLEND SS 6F .021 (SHEATH) ×2
KIT HEART LEFT (KITS) ×2
PACK CARDIAC CATHETERIZATION (CUSTOM PROCEDURE TRAY) ×2
SHEATH FAST CATH BRACH 5F 5CM (SHEATH) ×2
SYR MEDRAD MARK V 150ML (SYRINGE) ×2
TRANSDUCER W/STOPCOCK (MISCELLANEOUS) ×2
TUBING CIL FLEX 10 FLL-RA (TUBING) ×2
WIRE HI TORQ VERSACORE-J 145CM (WIRE) ×2
WIRE SAFE-T 1.5MM-J .035X260CM (WIRE) ×4

## 2015-09-15 NOTE — Progress Notes (Signed)
Attempted to withdraw air from TRB/pt immediately stasrted to bleed. Air replaced in [place]. No further bleeding. Reported to pts RN, Orma Flaming

## 2015-09-15 NOTE — Progress Notes (Signed)
Discussed how this medication will work to lower her blood sugars.  We also discussed how/when/where to inject this medication.  She reported good understanding of this.  We also discussed the importance of site rotation, and areas she can use to inject.  We also discussed how to adjust the dosage after 7 days.  She was given a starter kit with the above information in it, along with a copay card She had no final questions.

## 2015-09-15 NOTE — Research (Signed)
CADLAD Informed Consent   Subject Name: Susan Peck  Subject met inclusion and exclusion criteria.  The informed consent form, study requirements and expectations were reviewed with the subject and questions and concerns were addressed prior to the signing of the consent form.  The subject verbalized understanding of the trail requirements.  The subject agreed to participate in the CADLAD trial and signed the informed consent.  The informed consent was obtained prior to performance of any protocol-specific procedures for the subject.  A copy of the signed informed consent was given to the subject and a copy was placed in the subject's medical record.  Hedrick,Tammy W 09/15/2015, 0110

## 2015-09-15 NOTE — Discharge Instructions (Signed)
Radial Site Care °Refer to this sheet in the next few weeks. These instructions provide you with information about caring for yourself after your procedure. Your health care provider may also give you more specific instructions. Your treatment has been planned according to current medical practices, but problems sometimes occur. Call your health care provider if you have any problems or questions after your procedure. °WHAT TO EXPECT AFTER THE PROCEDURE °After your procedure, it is typical to have the following: °· Bruising at the radial site that usually fades within 1-2 weeks. °· Blood collecting in the tissue (hematoma) that may be painful to the touch. It should usually decrease in size and tenderness within 1-2 weeks. °HOME CARE INSTRUCTIONS °· Take medicines only as directed by your health care provider. °· You may shower 24-48 hours after the procedure or as directed by your health care provider. Remove the bandage (dressing) and gently wash the site with plain soap and water. Pat the area dry with a clean towel. Do not rub the site, because this may cause bleeding. °· Do not take baths, swim, or use a hot tub until your health care provider approves. °· Check your insertion site every day for redness, swelling, or drainage. °· Do not apply powder or lotion to the site. °· Do not flex or bend the affected arm for 24 hours or as directed by your health care provider. °· Do not push or pull heavy objects with the affected arm for 24 hours or as directed by your health care provider. °· Do not lift over 10 lb (4.5 kg) for 5 days after your procedure or as directed by your health care provider. °· Ask your health care provider when it is okay to: °¨ Return to work or school. °¨ Resume usual physical activities or sports. °¨ Resume sexual activity. °· Do not drive home if you are discharged the same day as the procedure. Have someone else drive you. °· You may drive 24 hours after the procedure unless otherwise  instructed by your health care provider. °· Do not operate machinery or power tools for 24 hours after the procedure. °· If your procedure was done as an outpatient procedure, which means that you went home the same day as your procedure, a responsible adult should be with you for the first 24 hours after you arrive home. °· Keep all follow-up visits as directed by your health care provider. This is important. °SEEK MEDICAL CARE IF: °· You have a fever. °· You have chills. °· You have increased bleeding from the radial site. Hold pressure on the site. °SEEK IMMEDIATE MEDICAL CARE IF: °· You have unusual pain at the radial site. °· You have redness, warmth, or swelling at the radial site. °· You have drainage (other than a small amount of blood on the dressing) from the radial site. °· The radial site is bleeding, and the bleeding does not stop after 30 minutes of holding steady pressure on the site. °· Your arm or hand becomes pale, cool, tingly, or numb. °  °This information is not intended to replace advice given to you by your health care provider. Make sure you discuss any questions you have with your health care provider. °  °Document Released: 08/12/2010 Document Revised: 07/31/2014 Document Reviewed: 01/26/2014 °Elsevier Interactive Patient Education ©2016 [employer] ° °

## 2015-09-15 NOTE — Patient Instructions (Signed)
Take 0.6 Victoza once a day. Increase the dose to 1.2 after 7 days if no nausea.   Call if questions.

## 2015-09-15 NOTE — Interval H&P Note (Signed)
History and Physical Interval Note:  09/15/2015 10:37 AM  Susan Peck  has presented today for surgery, with the diagnosis of hf  The various methods of treatment have been discussed with the patient and family. After consideration of risks, benefits and other options for treatment, the patient has consented to  Procedure(s): Right/Left Heart Cath and Coronary Angiography (N/A) and possible coronary angioplasty as a surgical intervention .  The patient's history has been reviewed, patient examined, no change in status, stable for surgery.  I have reviewed the patient's chart and labs.  Questions were answered to the patient's satisfaction.     Syncere Eble, Quillian Quince

## 2015-09-15 NOTE — H&P (View-Only) (Signed)
Patient ID: GINDY BILES, female   DOB: 05-28-1966, 50 y.o.   MRN: [id number]  ADVANCED HF CLINIC NOTE  Patient ID: MEMORIE HAYDON, female   DOB: March 18, 1966, 50 y.o.   MRN: [id number]  PCP: Dr Lin Landsman GYN: Dr Carolin Guernsey Pulmonologist: Dr Gwenette Greet  HPI:  Evolett is a 50 year old African American female with systolic heart failure, HTN, GERD, 11/23/11 S/P Hysterectomy, multinodular goiter and asthma.   2004 cardiac cath by Dr Lynnell Jude in [place], [place] with one blockage noted - no stent. EF said to be normal. About 1 year ago at [facility] noted murmur and she was referred to Dr Tamala Julian EF 25-30%. More recently, she has been followed in CHF clinic.   Here for routine f/u. At last visit in October Entresto increased to 97/103 bid. Was not able to get higher dose and says she hasn't taken Entresto since. Continues to follow with Paramedicine. Still taking other meds including Bidil, carvedilol, digoxin and lasix 40 bid. Weight down 12 pounds to 186. Breathing better. Remains with NYHA II-III symptoms. No orthopnea or PND. Has been getting left-sided chest pain. No clear relation to exertion.  Getting occasional palpitations. Cardiac monitor in 5/16 with PACs and PVCs. Using CPAP regularly.   Echo today reviewed personally. EF 35-40%  04/03/2011  ECHO EF 25-30% 06/26/2012 ECHO EF 30-35%  02/29/2012 ECHO EF 35-40%  11/18/12 ECHO EF 55% Inferior wall mildly hypokinetic with septal HK Myoview 02/03/10 EF 42% No ischemia Septal and apical hypokinesis 11/14 Cardiac MRI  EF 52%- septal bounce suggestive of LBBB. Dyssynchrony from LBBB leads to the low calculated EF. Normal RV size and systolic function. No myocardial delayed enhancement, so no definite evidence for prior myocardial infarction, myocarditis, or infiltrative disease. 1/16 CT chest - small pleural effusions with early pulmonary edema. No ILD. 09/03/2014: ECHO EF 20-25%  6/16: ECHO EF 35-40% Cardiolite (6/16): EF 36%, no ischemia/infarction.    CPX 11/20/2014  Peak VO2: 16.1 (76.8% predicted peak VO2) - when corrected to ideal BW pVO2 is 22.9 VE/VCO2 slope: 28.5 OUES: 1.59 Peak RER: 1.18  Labs 08/19/14: K 4.1 Creatinine 0.62   Labs 10/28/2014: K 4.3 Creatinine 0.73  Labs 11/19/2014: K 3.6 Creatinine 0.64  Labs 6/16: digoxin 0.5 Labs 7/16: HIV negative, TSH normal  Lipids 8/16: TC 206, TG 133, HDL 29, LDL 150  ROS: All other systems normal except as mentioned in HPI, past medical history and problem list.    Past Medical History  Diagnosis Date  . Systolic heart failure     May 2011 EF 35-40%, 04/03/11 EF 25-30%, 06/27/11 EF 30-35%  . Asthma   . Obesity   . GERD (gastroesophageal reflux disease)   . Heart murmur     dx 2 yrs ago per pt  . Seasonal allergies   . Shortness of breath     occasional - exercise induced  . Diabetes mellitus     recent dx 11/17/11 - started med 11/18/11  . Goiter 07/27/2011    non-neoplastic goiter - fine needle aspiration - benign  . Low back pain     history  . Anxiety     no meds  . Depression     no meds  . IBS (irritable bowel syndrome)     tx with diet per pt  . Herpes genitalis in women     Current Outpatient Prescriptions  Medication Sig Dispense Refill  . albuterol (PROVENTIL HFA;VENTOLIN HFA) 108 (90 BASE) MCG/ACT inhaler Inhale 1-2  puffs into the lungs every 6 (six) hours as needed for wheezing or shortness of breath. 1 Inhaler 0  . aspirin 81 MG chewable tablet Chew 81 mg by mouth every morning.     Marland Kitchen atorvastatin (LIPITOR) 40 MG tablet Take 1 tablet (40 mg total) by mouth daily. 30 tablet 6  . carvedilol (COREG) 25 MG tablet Take 1 tablet (25 mg total) by mouth 2 (two) times daily with a meal. 60 tablet 6  . digoxin (LANOXIN) 0.125 MG tablet Take 1 tablet (0.125 mg total) by mouth daily. 30 tablet 6  . estradiol (ESTRACE) 1 MG tablet Take 1 tablet (1 mg total) by mouth daily. 30 tablet 11  . fluticasone (FLONASE) 50 MCG/ACT nasal spray Place 1 spray into both nostrils 2  (two) times daily as needed for allergies or rhinitis.    . furosemide (LASIX) 40 MG tablet Take 1 tablet (40 mg total) by mouth 2 (two) times daily. 60 tablet 3  . gabapentin (NEURONTIN) 300 MG capsule Take 300 mg by mouth 3 (three) times daily.    Marland Kitchen glimepiride (AMARYL) 2 MG tablet Take 2 mg by mouth daily with breakfast.    . hydrOXYzine (ATARAX/VISTARIL) 50 MG tablet Take 50 mg by mouth 3 (three) times daily as needed for anxiety.     . isosorbide-hydrALAZINE (BIDIL) 20-37.5 MG tablet Take 0.5 tablets by mouth 3 (three) times daily. 45 tablet 6  . levothyroxine (SYNTHROID, LEVOTHROID) 50 MCG tablet Take 1 tablet (50 mcg total) by mouth daily. 30 tablet 3  . metFORMIN (GLUCOPHAGE) 500 MG tablet Take 500 mg by mouth 2 (two) times daily with a meal.    . metoCLOPramide (REGLAN) 10 MG tablet Take 10 mg by mouth 4 (four) times daily.    Vladimir Faster Glycol-Propyl Glycol (SYSTANE) 0.4-0.3 % SOLN Apply 1 drop to eye 2 (two) times daily as needed (dry eyes).     No current facility-administered medications for this encounter.   Facility-Administered Medications Ordered in Other Encounters  Medication Dose Route Frequency Provider Last Rate Last Dose  . aminophylline injection 150 mg  150 mg Intravenous BID PRN Larey Dresser, MD   150 mg at 12/24/14 1005     Allergies  Allergen Reactions  . Shrimp [Shellfish Allergy] Anaphylaxis    Social History   Social History  . Marital Status: Legally Separated    Spouse Name: N/A  . Number of Children: Y  . 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 Smoking status: Never Smoker   . Smokeless tobacco: Never Used  . Alcohol Use: No  . Drug Use: No  . Sexual Activity: Yes    Birth Control/ Protection: Surgical   Other Topics Concern  . Not on file   Social History Narrative   She lives with her husband and son.  She is works for [employer].    Family  History  Problem Relation Age of Onset  . Heart disease Maternal Aunt   . Breast cancer Mother   . Thyroid disease Mother   . Hypertension Maternal Grandmother   . Breast cancer Maternal Aunt     PHYSICAL EXAM: Filed Vitals:   09/07/15 1108  BP: 108/58  Pulse: 72    General:  Well appearing. No respiratory difficulty HEENT:  Normal Neck: supple. JVP 6-7 Carotids 2+ bilat; no bruits. No lymphadenopathy. +  nodular goiter Cor:  PMI nondisplaced. Regular rate & rhythm. No rubs or murmurs.  Lungs: clear Abdomen: soft, nontender, obese, nondistended. No hepatosplenomegaly. No bruits or masses. Good bowel sounds. Extremities: no cyanosis, clubbing, rash., R and LLE trace edema.  Neuro: alert & oriented x 3, cranial nerves grossly intact. moves all 4 extremities w/o difficulty. Affect pleasant.  ASSESSMENT & PLAN: 1. Chronic systolic HF: Nonischemic cardiomyopathy.  Ech today reviewed personally and EF 35-40%, outside ICD range (similar to cMRI). NYHA class II-III symptoms, volume status stable. Painful gynecomastia with spironolactone.  - Continue lasix to 40 bid, check BMET today.   - On goal dose of carvedilol 25 bid.  - Continue digoxin 0.125 mg daily.  Check level today.   - Has been out of Entresto. Paperwork in process with Paramedicine and PharmD (waiting on Ms. Lovena Le to complete her part of forms)  - No longer taking eplerenone, thinks her insurance would not cover it.  - Given persistent LV dysfunction, history of CAD on remote cath and recent L-sided chest pain I think it is time to proceed with R & L heart cath. We discussed this in detail and will proceed next week.  2. Chest pressure --As above, plan cath.  3. HTN: BP stable.  4. OSA: Continue CPAP.  5. Multinodular goiter: TSH normal in 7/16.    6. Hyperlipidemia: - LDL up. HDL down. Needs exercise and weight loss. Continue atorva 40 daily (recently increased) 7. Palpitations: - Monitor shows PVCs and PACs  Total  time spent 45 minutes. Over half that time spent discussing above.   Glori Bickers MD 09/07/2015

## 2015-09-16 ENCOUNTER — Encounter (HOSPITAL_COMMUNITY): Payer: Self-pay | Admitting: Internal Medicine

## 2015-09-27 ENCOUNTER — Telehealth: Payer: Self-pay | Admitting: Endocrinology

## 2015-09-27 ENCOUNTER — Other Ambulatory Visit: Payer: BLUE CROSS/BLUE SHIELD

## 2015-09-27 NOTE — Telephone Encounter (Signed)
Pt wanted to let us know she cannot get the victoza it is too expensive can we offer an alternate

## 2015-09-27 NOTE — Telephone Encounter (Signed)
I called patient back and advised her to call her insurance company to see if they could give her the name of a lower cost medication

## 2015-09-30 ENCOUNTER — Ambulatory Visit: Payer: BLUE CROSS/BLUE SHIELD | Admitting: Endocrinology

## 2015-10-05 ENCOUNTER — Ambulatory Visit (HOSPITAL_COMMUNITY)
Admission: RE | Admit: 2015-10-05 | Discharge: 2015-10-05 | Disposition: A | Discharge status: Home / Self Care | Payer: BLUE CROSS/BLUE SHIELD | Source: Ambulatory Visit | Attending: Internal Medicine | Admitting: Internal Medicine

## 2015-10-05 VITALS — BP 142/80 | HR 61 | Wt 188.6 lb

## 2015-10-05 DIAGNOSIS — I110 Hypertensive heart disease with heart failure: Secondary | ICD-10-CM | POA: Insufficient documentation

## 2015-10-05 DIAGNOSIS — Z8249 Family history of ischemic heart disease and other diseases of the circulatory system: Secondary | ICD-10-CM | POA: Diagnosis not present

## 2015-10-05 DIAGNOSIS — J45909 Unspecified asthma, uncomplicated: Secondary | ICD-10-CM | POA: Insufficient documentation

## 2015-10-05 DIAGNOSIS — Z803 Family history of malignant neoplasm of breast: Secondary | ICD-10-CM | POA: Diagnosis not present

## 2015-10-05 DIAGNOSIS — K219 Gastro-esophageal reflux disease without esophagitis: Secondary | ICD-10-CM | POA: Insufficient documentation

## 2015-10-05 DIAGNOSIS — E119 Type 2 diabetes mellitus without complications: Secondary | ICD-10-CM | POA: Diagnosis not present

## 2015-10-05 DIAGNOSIS — Z833 Family history of diabetes mellitus: Secondary | ICD-10-CM | POA: Insufficient documentation

## 2015-10-05 DIAGNOSIS — R002 Palpitations: Secondary | ICD-10-CM

## 2015-10-05 DIAGNOSIS — E785 Hyperlipidemia, unspecified: Secondary | ICD-10-CM | POA: Diagnosis not present

## 2015-10-05 DIAGNOSIS — I428 Other cardiomyopathies: Secondary | ICD-10-CM | POA: Insufficient documentation

## 2015-10-05 DIAGNOSIS — E042 Nontoxic multinodular goiter: Secondary | ICD-10-CM | POA: Diagnosis not present

## 2015-10-05 DIAGNOSIS — Z79899 Other long term (current) drug therapy: Secondary | ICD-10-CM | POA: Diagnosis not present

## 2015-10-05 DIAGNOSIS — G4733 Obstructive sleep apnea (adult) (pediatric): Secondary | ICD-10-CM | POA: Insufficient documentation

## 2015-10-05 DIAGNOSIS — I159 Secondary hypertension, unspecified: Secondary | ICD-10-CM

## 2015-10-05 DIAGNOSIS — K589 Irritable bowel syndrome without diarrhea: Secondary | ICD-10-CM | POA: Diagnosis not present

## 2015-10-05 DIAGNOSIS — Z7984 Long term (current) use of oral hypoglycemic drugs: Secondary | ICD-10-CM | POA: Diagnosis not present

## 2015-10-05 DIAGNOSIS — E1169 Type 2 diabetes mellitus with other specified complication: Secondary | ICD-10-CM

## 2015-10-05 DIAGNOSIS — Z7982 Long term (current) use of aspirin: Secondary | ICD-10-CM | POA: Diagnosis not present

## 2015-10-05 DIAGNOSIS — IMO0002 Reserved for concepts with insufficient information to code with codable children: Secondary | ICD-10-CM | POA: Insufficient documentation

## 2015-10-05 DIAGNOSIS — I5022 Chronic systolic (congestive) heart failure: Secondary | ICD-10-CM | POA: Diagnosis not present

## 2015-10-05 DIAGNOSIS — E1165 Type 2 diabetes mellitus with hyperglycemia: Secondary | ICD-10-CM

## 2015-10-05 NOTE — Progress Notes (Signed)
Advanced Heart Failure Medication Review by a Pharmacist  Does the patient  feel that his/her medications are working for him/her?  yes  Has the patient been experiencing any side effects to the medications prescribed?  no  Does the patient measure his/her own blood pressure or blood glucose at home?  no   Does the patient have any problems obtaining medications due to transportation or finances?   Yes (victoza and DM test strips)  Understanding of regimen: fair Understanding of indications: fair Potential of compliance: fair Patient understands to avoid NSAIDs. Patient understands to avoid decongestants.  Issues to address at subsequent visits: Entresto    Pharmacist comments: 50 YO pleasant female presents for HF evaluation.  Pt reports taking her Entresto only once daily as she was provided with the samples from HF clinic and it did not have a SIG on the bottle.  Pt reports adherence to her HF regimen and reports no SE to her current regimen.  Pt has had trouble with her copays for her Victoza but is in the process of contacting her PCP for a different GLP1. Will followup about shipping of Entresto from medication assistance program.    Time with patient: 10 min  Preparation and documentation time: 5 min  Total time: 15 min

## 2015-10-05 NOTE — Progress Notes (Signed)
ADVANCED HF CLINIC NOTE  Patient ID: Susan Peck, female   DOB: 03-02-1966, 50 y.o.   MRN: [id number]  PCP: Dr Susan Peck GYN: Dr Susan Peck Pulmonologist: Dr Susan Peck  HPI:  Susan Peck is a 50 year old African American female with systolic heart failure, HTN, GERD, 11/23/11 S/P Hysterectomy, multinodular goiter and asthma.   2004 cardiac cath by Dr Susan Peck in [place], [place] with one blockage noted - no stent. EF said to be normal. About 1 year ago at [facility] noted murmur and she was referred to Dr Susan Julian EF 25-30%. More recently, she has been followed in CHF clinic.   She returns for HF follow up. Has only been taking entresto once a day. Overall feeling ok. SOB with steps. Denies PND/Orthopnea. Weight at home 185 pounds. Taking all medications. Using CPAP regularly. Having difficulty with diabetes. Not working and trying to get disability.    04/03/2011  ECHO EF 25-30% 06/26/2012 ECHO EF 30-35%  02/29/2012 ECHO EF 35-40%  11/18/12 ECHO EF 55% Inferior wall mildly hypokinetic with septal HK Myoview 02/03/10 EF 42% No ischemia Septal and apical hypokinesis 11/14 Cardiac MRI  EF 52%- septal bounce suggestive of LBBB. Dyssynchrony from LBBB leads to the low calculated EF. Normal RV size and systolic function. No myocardial delayed enhancement, so no definite evidence for prior myocardial infarction, myocarditis, or infiltrative disease. 1/16 CT chest - small pleural effusions with early pulmonary edema. No ILD. 09/03/2014: ECHO EF 20-25%  6/16: ECHO EF 35-40% Cardiolite (6/16): EF 36%, no ischemia/infarction.  09/07/2015: ECHO EF 30-35%   CPX 11/20/2014  Peak VO2: 16.1 (76.8% predicted peak VO2) - when corrected to ideal BW pVO2 is 22.9 VE/VCO2 slope: 28.5 OUES: 1.59 Peak RER: 1.18  LHC/RHC 09/15/2015  RA = 2 RV = 22/1/2 PA = 27/8 (19) PCW = 8 Fick cardiac output/index = 5.22/2.8 PVR = 2.1 Ao sat = 98% PA sat = 70%, 71% Assessment: 1. Normal coronary arteries 2. Normal hemodynamics   3. LVEF 35-40% with global HK due to   Labs 08/19/14: K 4.1 Creatinine 0.62   Labs 10/28/2014: K 4.3 Creatinine 0.73  Labs 11/19/2014: K 3.6 Creatinine 0.64  Labs 6/16: digoxin 0.5 Labs 7/16: HIV negative, TSH normal Lipids 8/16: TC 206, TG 133, HDL 29, LDL 150 Labs 08/18/2015: 9.1  Labs 09/07/2015: K 4.4 Creating 0.68   ROS: All other systems normal except as mentioned in HPI, past medical history and problem list.    Past Medical History  Diagnosis Date  . Systolic heart failure     May 2011 EF 35-40%, 04/03/11 EF 25-30%, 06/27/11 EF 30-35%  . Asthma   . Obesity   . GERD (gastroesophageal reflux disease)   . Heart murmur     dx 2 yrs ago per pt  . Seasonal allergies   . Shortness of breath     occasional - exercise induced  . Diabetes mellitus     recent dx 11/17/11 - started med 11/18/11  . Goiter 07/27/2011    non-neoplastic goiter - fine needle aspiration - benign  . Low back pain     history  . Anxiety     no meds  . Depression     no meds  . IBS (irritable bowel syndrome)     tx with diet per pt  . Herpes genitalis in women   . Hypertension     Current Outpatient Prescriptions  Medication Sig Dispense Refill  . albuterol (PROVENTIL HFA;VENTOLIN HFA) 108 (90 BASE)  MCG/ACT inhaler Inhale 1-2 puffs into the lungs every 6 (six) hours as needed for wheezing or shortness of breath. 1 Inhaler 0  . aspirin 81 MG chewable tablet Chew 81 mg by mouth every morning.     Marland Kitchen atorvastatin (LIPITOR) 40 MG tablet Take 1 tablet (40 mg total) by mouth daily. 30 tablet 6  . carvedilol (COREG) 25 MG tablet Take 1 tablet (25 mg total) by mouth 2 (two) times daily with a meal. 60 tablet 6  . digoxin (LANOXIN) 0.125 MG tablet Take 1 tablet (0.125 mg total) by mouth daily. 30 tablet 6  . estradiol (ESTRACE) 1 MG tablet Take 1 tablet (1 mg total) by mouth daily. 30 tablet 11  . fluticasone (FLONASE) 50 MCG/ACT nasal spray Place 1 spray into both nostrils 2 (two) times daily as needed for  allergies or rhinitis.    . furosemide (LASIX) 40 MG tablet Take 1 tablet (40 mg total) by mouth 2 (two) times daily. 60 tablet 3  . gabapentin (NEURONTIN) 300 MG capsule Take 300 mg by mouth 3 (three) times daily.    Marland Kitchen glimepiride (AMARYL) 2 MG tablet Take 2 mg by mouth daily with breakfast.    . Insulin Pen Needle (NOVOTWIST) 32G X 5 MM MISC Use one per day to inject Victoza 50 each 3  . isosorbide-hydrALAZINE (BIDIL) 20-37.5 MG tablet Take 0.5 tablets by mouth 3 (three) times daily. 45 tablet 6  . levothyroxine (SYNTHROID, LEVOTHROID) 50 MCG tablet Take 1 tablet (50 mcg total) by mouth daily. 30 tablet 3  . metFORMIN (GLUCOPHAGE-XR) 500 MG 24 hr tablet Take 4 tablets (2,000 mg total) by mouth daily with supper. (Patient taking differently: Take 500 mg by mouth daily with supper. ) 90 tablet 1  . metoCLOPramide (REGLAN) 10 MG tablet Take 10 mg by mouth 4 (four) times daily as needed.     Susan Peck Glycol-Propyl Glycol (SYSTANE) 0.4-0.3 % SOLN Apply 1 drop to eye 2 (two) times daily as needed (dry eyes).    . hydrOXYzine (ATARAX/VISTARIL) 50 MG tablet Take 50 mg by mouth 3 (three) times daily as needed for anxiety. Reported on 10/05/2015    . VICTOZA 18 MG/3ML SOPN Inject 0.2 mLs (1.2 mg total) into the skin daily. Inject once daily at the same time (Patient not taking: Reported on 10/05/2015) 2 pen 3   No current facility-administered medications for this encounter.   Facility-Administered Medications Ordered in Other Encounters  Medication Dose Route Frequency Provider Last Rate Last Dose  . aminophylline injection 150 mg  150 mg Intravenous BID PRN Larey Dresser, MD   150 mg at 12/24/14 1005     Allergies  Allergen Reactions  . Shrimp [Shellfish Allergy] Anaphylaxis    Social History   Social History  . Marital Status: Legally Separated    Spouse Name: N/A  . Number of Children: Y  . Years of Education: N/A   Occupational History  . 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 Smoking status: Never Smoker   . Smokeless tobacco: Never Used  . Alcohol Use: No  . Drug Use: No  . Sexual Activity: Yes    Birth Control/ Protection: Surgical   Other Topics Concern  . Not on file   Social History Narrative   She lives with her husband and son.  She is works for [employer].    Family History  Problem Relation Age of Onset  .  Heart disease Maternal Aunt   . Breast cancer Mother   . Thyroid disease Mother   . Hypertension Maternal Grandmother   . Diabetes Maternal Grandmother   . Breast cancer Maternal Aunt   . Heart disease Maternal Aunt   . Diabetes Maternal Grandfather   . Diabetes Paternal Grandmother   . Diabetes Paternal Grandfather   . Hypertension Paternal Grandfather     PHYSICAL EXAM: Filed Vitals:   10/05/15 1214  BP: 142/80  Pulse: 61    General:  Well appearing. No respiratory difficulty HEENT:  Normal Neck: supple. JVP 6-7 Carotids 2+ bilat; no bruits. No lymphadenopathy. +  nodular goiter Cor: PMI nondisplaced. Regular rate & rhythm. No rubs or murmurs.  Lungs: clear Abdomen: soft, nontender, obese, nondistended. No hepatosplenomegaly. No bruits or masses. Good bowel sounds. Extremities: no cyanosis, clubbing, rash., R and LLE trace edema.  Neuro: alert & oriented x 3, cranial nerves grossly intact. moves all 4 extremities w/o difficulty. Affect pleasant.  ASSESSMENT & PLAN: 1. Chronic systolic HF: Nonischemic cardiomyopathy.  Ech today reviewed personally and EF 35-40%, outside ICD range (similar to cMRI). NYHA class II-III symptoms, volume status stable. Not on spiro due to painful gynecomastia. Can consider inspra  If needed.   - Continue lasix to 40 bid, check BMET today.   - On goal dose of carvedilol 25 bid.  - Continue digoxin 0.125 mg daily.   - I have asked her to take entresto twice a day as she was only taking once a day.  - 2. Chest pressure- resolved. LHC 08/2015 with normal cors.     3. HTN: BP stable.  4. OSA: Continue CPAP.  5. Multinodular goiter: TSH normal in 7/16.    6. Hyperlipidemia: - LDL up. HDL down. Needs exercise and weight loss. Continue atorva 40 daily (recently increased) 7. Palpitations: resolved.  8. DM II: followed by PCP. Refer to [facility] for assistance with diabetes management.    Follow up in 4 weeks.   Aengus Sauceda NP-C  10/05/2015

## 2015-10-05 NOTE — Patient Instructions (Signed)
Please be sure to take your Entresto twice a day  You have been referred to [facility] rehab- for further management of your diabetes  Your physician recommends that you schedule a follow-up appointment in: 4 weeks with In the [place] the following things EVERYDAY: 1) Weigh yourself in the morning before breakfast. Write it down and keep it in a log. 2) Take your medicines as prescribed 3) Eat low salt foods-Limit salt (sodium) to 2000 mg per day.  4) Stay as active as you can everyday Limit all fluids for the day to less than 2 liters

## 2015-10-06 ENCOUNTER — Telehealth (HOSPITAL_COMMUNITY): Payer: Self-pay | Admitting: Pharmacist

## 2015-10-06 NOTE — Telephone Encounter (Signed)
[employer] patient assistance approved for coverage of Entresto through 07/23/16. Medication will be shipped to patient's home every month at no charge to her.   Ruta Hinds. Velva Harman, PharmD, BCPS, CPP Clinical Pharmacist Pager: [phone] Phone: [phone] 10/06/2015 12:35 PM

## 2015-10-20 ENCOUNTER — Telehealth (HOSPITAL_COMMUNITY): Payer: Self-pay | Admitting: Vascular Surgery

## 2015-10-20 NOTE — Telephone Encounter (Signed)
Pt needs refill Furosemide called in to [facility] [address]

## 2015-10-21 ENCOUNTER — Other Ambulatory Visit (HOSPITAL_COMMUNITY): Payer: Self-pay | Admitting: *Deleted

## 2015-10-21 DIAGNOSIS — I5022 Chronic systolic (congestive) heart failure: Secondary | ICD-10-CM

## 2015-10-21 MED ORDER — FUROSEMIDE 40 MG PO TABS: 40.0000 mg | ORAL_TABLET | Freq: Two times a day (BID) | ORAL | Status: DC

## 2015-11-02 ENCOUNTER — Encounter (HOSPITAL_COMMUNITY): Payer: BLUE CROSS/BLUE SHIELD

## 2015-11-04 ENCOUNTER — Telehealth: Payer: Self-pay | Admitting: Endocrinology

## 2015-11-04 ENCOUNTER — Encounter (HOSPITAL_COMMUNITY): Payer: BLUE CROSS/BLUE SHIELD

## 2015-11-04 NOTE — Telephone Encounter (Signed)
Noted, she has been scheduled for next week.

## 2015-11-04 NOTE — Telephone Encounter (Signed)
Metformin ER 500 mg 1 in am and 2 at dinner She has not rescheduled her canceled appointment for 09/30/15.  Needs to do it right away

## 2015-11-04 NOTE — Telephone Encounter (Signed)
Please see below and advise, it looks like you sent the rx in on 09/08/15

## 2015-11-04 NOTE — Telephone Encounter (Signed)
Pt is calling to verify that the Metformin prescription she picked up from the pharmacy was correct because she said she does not remember ever having to take 4 tablets a day.

## 2015-11-09 ENCOUNTER — Other Ambulatory Visit (INDEPENDENT_AMBULATORY_CARE_PROVIDER_SITE_OTHER): Payer: BLUE CROSS/BLUE SHIELD

## 2015-11-09 DIAGNOSIS — E1165 Type 2 diabetes mellitus with hyperglycemia: Secondary | ICD-10-CM

## 2015-11-09 LAB — COMPREHENSIVE METABOLIC PANEL
ALBUMIN: 3.7 g/dL (ref 3.5–5.2)
ALK PHOS: 84 U/L (ref 39–117)
ALT: 19 U/L (ref 0–35)
AST: 14 U/L (ref 0–37)
BUN: 13 mg/dL (ref 6–23)
CALCIUM: 9.4 mg/dL (ref 8.4–10.5)
CO2: 28 mEq/L (ref 19–32)
CREATININE: 0.62 mg/dL (ref 0.40–1.20)
Chloride: 102 mEq/L (ref 96–112)
GFR: 131.01 mL/min (ref >60.00)
Glucose, Bld: 284 mg/dL — ABNORMAL HIGH (ref 70–99)
POTASSIUM: 3.5 meq/L (ref 3.5–5.1)
SODIUM: 138 meq/L (ref 135–145)
TOTAL PROTEIN: 7.1 g/dL (ref 6.0–8.3)
Total Bilirubin: 0.5 mg/dL (ref 0.2–1.2)

## 2015-11-09 LAB — T4, FREE: FREE T4: 0.98 ng/dL (ref 0.60–1.60)

## 2015-11-09 LAB — LIPID PANEL
CHOLESTEROL: 192 mg/dL (ref 0–200)
HDL: 39.5 mg/dL (ref >39.00)
LDL CALC: 115 mg/dL — AB (ref 0–99)
NonHDL: 152.83
TRIGLYCERIDES: 189 mg/dL — AB (ref 0.0–149.0)
Total CHOL/HDL Ratio: 5
VLDL: 37.8 mg/dL (ref 0.0–40.0)

## 2015-11-09 LAB — TSH: TSH: 0.97 u[IU]/mL (ref 0.35–4.50)

## 2015-11-09 LAB — MICROALBUMIN / CREATININE URINE RATIO
CREATININE, U: 116.2 mg/dL
Microalb Creat Ratio: 1.2 mg/g (ref 0.0–30.0)
Microalb, Ur: 1.4 mg/dL (ref 0.0–1.9)

## 2015-11-10 LAB — FRUCTOSAMINE: Fructosamine: 336 umol/L — ABNORMAL HIGH (ref 0–285)

## 2015-11-11 ENCOUNTER — Encounter: Payer: Self-pay | Admitting: Endocrinology

## 2015-11-11 ENCOUNTER — Ambulatory Visit (INDEPENDENT_AMBULATORY_CARE_PROVIDER_SITE_OTHER): Payer: BLUE CROSS/BLUE SHIELD | Admitting: Endocrinology

## 2015-11-11 VITALS — BP 124/80 | HR 83 | Temp 98.3°F | Resp 14 | Ht 63.0 in | Wt 192.8 lb

## 2015-11-11 DIAGNOSIS — E042 Nontoxic multinodular goiter: Secondary | ICD-10-CM

## 2015-11-11 DIAGNOSIS — E1165 Type 2 diabetes mellitus with hyperglycemia: Secondary | ICD-10-CM | POA: Diagnosis not present

## 2015-11-11 DIAGNOSIS — E785 Hyperlipidemia, unspecified: Secondary | ICD-10-CM | POA: Diagnosis not present

## 2015-11-11 MED ORDER — DULOXETINE HCL 30 MG PO CPEP: 30.0000 mg | ORAL_CAPSULE | Freq: Every day | ORAL | Status: DC

## 2015-11-11 NOTE — Progress Notes (Signed)
Patient ID: Susan Peck, female   DOB: February 13, 1966, 50 y.o.   MRN: [id number]           Reason for Appointment: for Type 2 Diabetes  Referring physician: Elon Alas  History of Present Illness:          Date of diagnosis of type 2 diabetes mellitus: 2013 ?        Background history:   She does not remember the circumstances of her diagnosis She thinks she has been mostly taking metformin and Amaryl for her treatment and usually managed by her PCP Sugars were <150 about a year or so ago with this management but details of her previous A1c results are not available Periodically she has had difficulty affording medications also  Recent history:     In 12/16 she was off medications for about 2 weeks because of change in insurance She thinks however that even with starting back on her metformin and Amaryl her blood sugars were higher on her initial consultation, around 300 She was recommended Victoza but because of the high out-of-pocket expense she did not start this and did not notify us  Current blood sugar patterns and problems identified:  She checks her blood sugar only once a day and none after meals as instructed  She is still taking metformin, doing better with ER but still taking only 1000 mg a day  She does not monitor her readings after meals and her glucose was 284 after breakfast in the lab  However her fasting blood sugars are looking much better.  She thinks she is overall cutting back on portions and high-fat foods  Fructosamine is still high at 336  She will drink a lot of juice in the mornings despite her instructions to avoid this  She does not exercise despite instructions  Non-insulin hypoglycemic drugs the patient is taking are: Metformin ER 500 mg twice a day, Amaryl 2 mg daily      Side effects from medications have been: None  Compliance with the medical regimen: Fair Hypoglycemia:   none  Glucose monitoring:  done 1  times a day          Glucometer: Contour      Mean values apply above for all meters except median for One Touch  PRE-MEAL Fasting Lunch Dinner Bedtime Overall  Glucose range: 100-215      Mean/median: 150        Self-care: The diet that the patient has been following is: tries to limit fried food and sweets .  Juice 2-3 cups   Meal times 2-3 per day  Typical meal intake: Breakfast is cereal or eggs                Dietician visit, most recent:never               Exercise: none   Weight history:  Wt Readings from Last 3 Encounters:  11/11/15 192 lb 12.8 oz (87.454 kg)  10/05/15 188 lb 9.6 oz (85.548 kg)  09/15/15 186 lb (84.369 kg)    Glycemic control:   Lab Results  Component Value Date   HGBA1C 9.1* 08/18/2015   Lab Results  Component Value Date   MICROALBUR 1.4 11/09/2015   LDLCALC 115* 11/09/2015   CREATININE 0.62 11/09/2015    Lab on 11/09/2015  Component Date Value Ref Range Status  . Fructosamine 11/09/2015 336* 0 - 285 umol/L Final   Comment: Published reference interval for apparently healthy subjects between  age 72 and 48 is 61 - 53 umol/L and in a poorly controlled diabetic population is 228 - 563 umol/L with a mean of 396 umol/L.   Marland Kitchen Cholesterol 11/09/2015 192  0 - 200 mg/dL Final   ATP III Classification       Desirable:  < 200 mg/dL               Borderline High:  200 - 239 mg/dL          High:  > = 240 mg/dL  . Triglycerides 11/09/2015 189.0* 0.0 - 149.0 mg/dL Final   Normal:  <150 mg/dLBorderline High:  150 - 199 mg/dL  . HDL 11/09/2015 39.50  >39.00 mg/dL Final  . VLDL 11/09/2015 37.8  0.0 - 40.0 mg/dL Final  . LDL Cholesterol 11/09/2015 115* 0 - 99 mg/dL Final  . Total CHOL/HDL Ratio 11/09/2015 5   Final                  Men          Women1/2 Average Risk     3.4          3.3Average Risk          5.0          4.42X Average Risk          9.6          7.13X Average Risk          15.0          11.0                      . NonHDL 11/09/2015 152.83   Final   NOTE:   Non-HDL goal should be 30 mg/dL higher than patient's LDL goal (i.e. LDL goal of < 70 mg/dL, would have non-HDL goal of < 100 mg/dL)  . Sodium 11/09/2015 138  135 - 145 mEq/L Final  . Potassium 11/09/2015 3.5  3.5 - 5.1 mEq/L Final  . Chloride 11/09/2015 102  96 - 112 mEq/L Final  . CO2 11/09/2015 28  19 - 32 mEq/L Final  . Glucose, Bld 11/09/2015 284* 70 - 99 mg/dL Final  . BUN 11/09/2015 13  6 - 23 mg/dL Final  . Creatinine, Ser 11/09/2015 0.62  0.40 - 1.20 mg/dL Final  . Total Bilirubin 11/09/2015 0.5  0.2 - 1.2 mg/dL Final  . Alkaline Phosphatase 11/09/2015 84  39 - 117 U/L Final  . AST 11/09/2015 14  0 - 37 U/L Final  . ALT 11/09/2015 19  0 - 35 U/L Final  . Total Protein 11/09/2015 7.1  6.0 - 8.3 g/dL Final  . Albumin 11/09/2015 3.7  3.5 - 5.2 g/dL Final  . Calcium 11/09/2015 9.4  8.4 - 10.5 mg/dL Final  . GFR 11/09/2015 131.01  >60.00 mL/min Final  . TSH 11/09/2015 0.97  0.35 - 4.50 uIU/mL Final  . Free T4 11/09/2015 0.98  0.60 - 1.60 ng/dL Final  . Microalb, Ur 11/09/2015 1.4  0.0 - 1.9 mg/dL Final  . Creatinine,U 11/09/2015 116.2   Final  . Microalb Creat Ratio 11/09/2015 1.2  0.0 - 30.0 mg/g Final        Medication List       This list is accurate as of: 11/11/15 11:59 PM.  Always use your most recent med list.               albuterol 108 (90 Base) MCG/ACT inhaler  Commonly  known as:  PROVENTIL HFA;VENTOLIN HFA  Inhale 1-2 puffs into the lungs every 6 (six) hours as needed for wheezing or shortness of breath.     aspirin 81 MG chewable tablet  Chew 81 mg by mouth every morning.     atorvastatin 40 MG tablet  Commonly known as:  LIPITOR  Take 1 tablet (40 mg total) by mouth daily.     BAYER CONTOUR TEST test strip  Generic drug:  glucose blood     carvedilol 25 MG tablet  Commonly known as:  COREG  Take 1 tablet (25 mg total) by mouth 2 (two) times daily with a meal.     digoxin 0.125 MG tablet  Commonly known as:  LANOXIN  Take 1 tablet (0.125 mg total)  by mouth daily.     DULoxetine 30 MG capsule  Commonly known as:  CYMBALTA  Take 1 capsule (30 mg total) by mouth daily.     ENTRESTO 24-26 MG  Generic drug:  sacubitril-valsartan  Take 1 tablet by mouth daily.     estradiol 1 MG tablet  Commonly known as:  ESTRACE  Take 1 tablet (1 mg total) by mouth daily.     FLONASE 50 MCG/ACT nasal spray  Generic drug:  fluticasone  Place 1 spray into both nostrils 2 (two) times daily as needed for allergies or rhinitis.     furosemide 40 MG tablet  Commonly known as:  LASIX  Take 1 tablet (40 mg total) by mouth 2 (two) times daily.     gabapentin 300 MG capsule  Commonly known as:  NEURONTIN  Take 300 mg by mouth 3 (three) times daily.     glimepiride 2 MG tablet  Commonly known as:  AMARYL  Take 2 mg by mouth daily with breakfast.     hydrOXYzine 50 MG tablet  Commonly known as:  ATARAX/VISTARIL  Take 50 mg by mouth 3 (three) times daily as needed for anxiety. Reported on 10/05/2015     Insulin Pen Needle 32G X 5 MM Misc  Commonly known as:  NOVOTWIST  Use one per day to inject Victoza     isosorbide-hydrALAZINE 20-37.5 MG tablet  Commonly known as:  BIDIL  Take 0.5 tablets by mouth 3 (three) times daily.     levothyroxine 50 MCG tablet  Commonly known as:  SYNTHROID, LEVOTHROID  Take 1 tablet (50 mcg total) by mouth daily.     metFORMIN 500 MG 24 hr tablet  Commonly known as:  GLUCOPHAGE-XR  Take 4 tablets (2,000 mg total) by mouth daily with supper.     metoCLOPramide 10 MG tablet  Commonly known as:  REGLAN  Take 10 mg by mouth 4 (four) times daily as needed.     SYSTANE 0.4-0.3 % Soln  Generic drug:  Polyethyl Glycol-Propyl Glycol  Apply 1 drop to eye 2 (two) times daily as needed (dry eyes).     VICTOZA 18 MG/3ML Sopn  Generic drug:  Liraglutide  Inject 0.2 mLs (1.2 mg total) into the skin daily. Inject once daily at the same time        Allergies:  Allergies  Allergen Reactions  . Shrimp [Shellfish  Allergy] Anaphylaxis    Past Medical History  Diagnosis Date  . Systolic heart failure     May 2011 EF 35-40%, 04/03/11 EF 25-30%, 06/27/11 EF 30-35%  . Asthma   . Obesity   . GERD (gastroesophageal reflux disease)   . Heart murmur     dx  2 yrs ago per pt  . Seasonal allergies   . Shortness of breath     occasional - exercise induced  . Diabetes mellitus     recent dx 11/17/11 - started med 11/18/11  . Goiter 07/27/2011    non-neoplastic goiter - fine needle aspiration - benign  . Low back pain     history  . Anxiety     no meds  . Depression     no meds  . IBS (irritable bowel syndrome)     tx with diet per pt  . Herpes genitalis in women   . Hypertension     Past Surgical History  Procedure Laterality Date  . Tubal ligation    . Diagnostic laparoscopy      of pelvis  . Novasure ablation      10/2005  . Dilation and curettage of uterus  12/2006,  10/2005    hysteroscopy surgery x 2  . Svd      x 3  . Wisdom tooth extraction    . Colonscopy    . Cardiac catherization  2004    [place], [place] - Dr Lynnell Jude  . Laparoscopic assisted vaginal hysterectomy  11/23/2011    Procedure: LAPAROSCOPIC ASSISTED VAGINAL HYSTERECTOMY;  Surgeon: Jolayne Haines, MD;  Location: [facility];  Service: Gynecology;  Laterality: N/A;  . Cystoscopy  11/23/2011    Procedure: CYSTOSCOPY;  Surgeon: Jolayne Haines, MD;  Location: [facility];  Service: Gynecology;  Laterality: N/A;  . Salpingoophorectomy  11/23/2011    Procedure: SALPINGO OOPHERECTOMY;  Surgeon: Jolayne Haines, MD;  Location: [facility];  Service: Gynecology;  Laterality: Bilateral;  . Abdominal hysterectomy    . Cardiac catheterization    . Colonoscopy with propofol N/A 03/26/2015    Procedure: COLONOSCOPY WITH PROPOFOL;  Surgeon: Carol Ada, MD;  Location: [facility];  Service: Endoscopy;  Laterality: N/A;  . Cardiac catheterization N/A 09/15/2015    Procedure: Right/Left Heart Cath and Coronary Angiography;  Surgeon: Jolaine Artist, MD;   Location: [facility];  Service: Cardiovascular;  Laterality: N/A;    Family History  Problem Relation Age of Onset  . Heart disease Maternal Aunt   . Breast cancer Mother   . Thyroid disease Mother   . Hypertension Maternal Grandmother   . Diabetes Maternal Grandmother   . Breast cancer Maternal Aunt   . Heart disease Maternal Aunt   . Diabetes Maternal Grandfather   . Diabetes Paternal Grandmother   . Diabetes Paternal Grandfather   . Hypertension Paternal Grandfather     Social History:  reports that she has never smoked. She has never used smokeless tobacco. She reports that she does not drink alcohol or use illicit drugs.    Review of Systems   GOITER thyroid enlargement was first discovered in 2012, probably on a routine exam  Her ultrasound examination in 2012 showed multiple nodules with the largest nodule 24 mm on the right side and 11 mm on the left The largest nodule was 3.3cm in 2016  She has had difficulty with swallowing at times, no certain foods  Does feel like she has a slight choking sensation in her neck when she is lying down on the right side She thinks symptoms are more prominent recently with her swallowing difficulty  She was started on thyroid supplementation 50 g to help limit the size of her goiter    Lab Results  Component Value Date   TSH 0.97 11/09/2015  TSH 0.768 05/17/2015   TSH 1.742 02/09/2015   FREET4 0.98 11/09/2015   FREET4 1.33* 05/17/2015   FREET4 1.24 07/10/2011      Lipid history: on treatment with Lipitor 40 mg    Lab Results  Component Value Date   CHOL 192 11/09/2015   HDL 39.50 11/09/2015   LDLCALC 115* 11/09/2015   TRIG 189.0* 11/09/2015   CHOLHDL 5 11/09/2015           Hypertension: Present but mostly on medications for her CHF  Most recent eye exam was years ago, cannot afford this   No results found for: HMDIABEYEEXA   Most recent foot exam: 08/2015  She has had pains in her legs not helped by  gabapentin, has discussed with PCP  Review of Systems   LABS:  Lab on 11/09/2015  Component Date Value Ref Range Status  . Fructosamine 11/09/2015 336* 0 - 285 umol/L Final   Comment: Published reference interval for apparently healthy subjects between age 50 and 71 is 58 - 285 umol/L and in a poorly controlled diabetic population is 228 - 563 umol/L with a mean of 396 umol/L.   Marland Kitchen Cholesterol 11/09/2015 192  0 - 200 mg/dL Final   ATP III Classification       Desirable:  < 200 mg/dL               Borderline High:  200 - 239 mg/dL          High:  > = 240 mg/dL  . Triglycerides 11/09/2015 189.0* 0.0 - 149.0 mg/dL Final   Normal:  <150 mg/dLBorderline High:  150 - 199 mg/dL  . HDL 11/09/2015 39.50  >39.00 mg/dL Final  . VLDL 11/09/2015 37.8  0.0 - 40.0 mg/dL Final  . LDL Cholesterol 11/09/2015 115* 0 - 99 mg/dL Final  . Total CHOL/HDL Ratio 11/09/2015 5   Final                  Men          Women1/2 Average Risk     3.4          3.3Average Risk          5.0          4.42X Average Risk          9.6          7.13X Average Risk          15.0          11.0                      . NonHDL 11/09/2015 152.83   Final   NOTE:  Non-HDL goal should be 30 mg/dL higher than patient's LDL goal (i.e. LDL goal of < 70 mg/dL, would have non-HDL goal of < 100 mg/dL)  . Sodium 11/09/2015 138  135 - 145 mEq/L Final  . Potassium 11/09/2015 3.5  3.5 - 5.1 mEq/L Final  . Chloride 11/09/2015 102  96 - 112 mEq/L Final  . CO2 11/09/2015 28  19 - 32 mEq/L Final  . Glucose, Bld 11/09/2015 284* 70 - 99 mg/dL Final  . BUN 11/09/2015 13  6 - 23 mg/dL Final  . Creatinine, Ser 11/09/2015 0.62  0.40 - 1.20 mg/dL Final  . Total Bilirubin 11/09/2015 0.5  0.2 - 1.2 mg/dL Final  . Alkaline Phosphatase 11/09/2015 84  39 - 117 U/L Final  . AST 11/09/2015 14  0 - 37 U/L Final  . ALT 11/09/2015 19  0 - 35 U/L Final  . Total Protein 11/09/2015 7.1  6.0 - 8.3 g/dL Final  . Albumin 11/09/2015 3.7  3.5 - 5.2 g/dL Final  .  Calcium 11/09/2015 9.4  8.4 - 10.5 mg/dL Final  . GFR 11/09/2015 131.01  >60.00 mL/min Final  . TSH 11/09/2015 0.97  0.35 - 4.50 uIU/mL Final  . Free T4 11/09/2015 0.98  0.60 - 1.60 ng/dL Final  . Microalb, Ur 11/09/2015 1.4  0.0 - 1.9 mg/dL Final  . Creatinine,U 11/09/2015 116.2   Final  . Microalb Creat Ratio 11/09/2015 1.2  0.0 - 30.0 mg/g Final    Physical Examination:  BP 124/80 mmHg  Pulse 83  Temp(Src) 98.3 F (36.8 C)  Resp 14  Ht 5\' 3"  (1.6 m)  Wt 192 lb 12.8 oz (87.454 kg)  BMI 34.16 kg/m2  SpO2 96%       ASSESSMENT:  Diabetes type 2, uncontrolled    See history of present illness for detailed discussion of current diabetes management, blood sugar patterns and problems identified Although she is not taking any new medications her fasting blood sugars are generally better, averaging 150 She did not start Victoza because of cost She has not monitored readings after meals as discussed before Also drinking a lot of juice despite instructions and her glucose after breakfast was 284 She does need better meal planning and regular exercise although limited by her leg pains and back pain   Complications: Painful neuropathy: Not improved with low-dose gabapentin, normal urine microalbumin   GOITER: She is not very symptomatic now and can continue to monitor clinically  HYPERLIPIDEMIA:  Needs better control, she is reluctant to add another medication at this time, discussed LDL targets  PLAN:     Check with insurance about less expensive medication than Victoza, she will call back  May also consider Invokana although will have to adjust her diuretics when starting this in coordination with cardiologist    Start improving diet significantly and avoid juices  Consultation with dietitian Check at least half of the blood sugars after meals, discussed blood sugar targets  Exercise as much as possible  Trial of Cymbalta instead of gabapentin for neuropathy, discussed  possible side effects and benefits  May need pain management Metformin ER 1500 mg daily     Patient Instructions  Check blood sugars on waking up 2-3  times a week Also check blood sugars about 2 hours after a meal and do this after different meals by rotation  Recommended blood sugar levels on waking up is 90-130 and about 2 hours after meal is 130-160  Please bring your blood sugar monitor to each visit, thank you  No juice  Check alternatives to Victoza from insurance      Counseling time on subjects discussed above is over 50% of today's 25 minute visit    Bird Swetz 11/12/2015, 8:11 AM   Note: This office note was prepared with Estate agent. Any transcriptional errors that result from this process are unintentional.

## 2015-11-11 NOTE — Patient Instructions (Addendum)
Check blood sugars on waking up 2-3  times a week Also check blood sugars about 2 hours after a meal and do this after different meals by rotation  Recommended blood sugar levels on waking up is 90-130 and about 2 hours after meal is 130-160  Please bring your blood sugar monitor to each visit, thank you  No juice  Check alternatives to Victoza from insurance

## 2015-11-16 ENCOUNTER — Ambulatory Visit (HOSPITAL_COMMUNITY)
Admission: RE | Admit: 2015-11-16 | Discharge: 2015-11-16 | Disposition: A | Discharge status: Home / Self Care | Payer: BLUE CROSS/BLUE SHIELD | Source: Ambulatory Visit | Attending: Internal Medicine | Admitting: Internal Medicine

## 2015-11-16 ENCOUNTER — Encounter: Payer: Self-pay | Admitting: Licensed Clinical Social Worker

## 2015-11-16 VITALS — BP 94/56 | HR 82 | Wt 190.4 lb

## 2015-11-16 DIAGNOSIS — E785 Hyperlipidemia, unspecified: Secondary | ICD-10-CM | POA: Insufficient documentation

## 2015-11-16 DIAGNOSIS — J45909 Unspecified asthma, uncomplicated: Secondary | ICD-10-CM | POA: Insufficient documentation

## 2015-11-16 DIAGNOSIS — E119 Type 2 diabetes mellitus without complications: Secondary | ICD-10-CM | POA: Diagnosis not present

## 2015-11-16 DIAGNOSIS — Z794 Long term (current) use of insulin: Secondary | ICD-10-CM | POA: Insufficient documentation

## 2015-11-16 DIAGNOSIS — I428 Other cardiomyopathies: Secondary | ICD-10-CM | POA: Insufficient documentation

## 2015-11-16 DIAGNOSIS — G4733 Obstructive sleep apnea (adult) (pediatric): Secondary | ICD-10-CM

## 2015-11-16 DIAGNOSIS — Z79899 Other long term (current) drug therapy: Secondary | ICD-10-CM | POA: Insufficient documentation

## 2015-11-16 DIAGNOSIS — I110 Hypertensive heart disease with heart failure: Secondary | ICD-10-CM | POA: Insufficient documentation

## 2015-11-16 DIAGNOSIS — I159 Secondary hypertension, unspecified: Secondary | ICD-10-CM

## 2015-11-16 DIAGNOSIS — E042 Nontoxic multinodular goiter: Secondary | ICD-10-CM | POA: Insufficient documentation

## 2015-11-16 DIAGNOSIS — Z8349 Family history of other endocrine, nutritional and metabolic diseases: Secondary | ICD-10-CM | POA: Diagnosis not present

## 2015-11-16 DIAGNOSIS — I5022 Chronic systolic (congestive) heart failure: Secondary | ICD-10-CM | POA: Diagnosis not present

## 2015-11-16 DIAGNOSIS — Z7982 Long term (current) use of aspirin: Secondary | ICD-10-CM | POA: Insufficient documentation

## 2015-11-16 DIAGNOSIS — K219 Gastro-esophageal reflux disease without esophagitis: Secondary | ICD-10-CM | POA: Diagnosis not present

## 2015-11-16 MED ORDER — FUROSEMIDE 40 MG PO TABS: 40.0000 mg | ORAL_TABLET | Freq: Every day | ORAL | Status: DC

## 2015-11-16 NOTE — Patient Instructions (Signed)
STOP Digoxin  DECREASE LASIX TO 40 MG, DAILY  Your physician recommends that you schedule a follow-up appointment in: 2 MONTHS In the [place] following things EVERYDAY: 1) Weigh yourself in the morning before breakfast. Write it down and keep it in a log. 2) Take your medicines as prescribed 3) Eat low salt foods-Limit salt (sodium) to 2000 mg per day.  4) Stay as active as you can everyday 5) Limit all fluids for the day to less than 2 liters 6)

## 2015-11-16 NOTE — Progress Notes (Signed)
CSW met with patient in the clinic per her request for a new PCP. Patient states she has a pending disability application and an advocate assisting with the process. Patient states that she would like a new PCP. CSW discussed options and per her request referred to IM clinic as she would like to have all her medical  services in same area. Patient verbalizes understanding of follow up needed. CSW available as needed. Raquel Sarna, LCSW [phone]

## 2015-11-16 NOTE — Progress Notes (Signed)
Patient ID: Susan Peck, female   DOB: [birth year], 50 y.o.   MRN: [id number]  ADVANCED HF CLINIC NOTE  Patient ID: Susan Peck, female   DOB: 08-May-1966, 50 y.o.   MRN: [id number]  PCP: Dr Lin Landsman GYN: Dr Carolin Guernsey Pulmonologist: Dr Gwenette Greet  HPI:  Susan Peck is a 50 year old African American female with systolic heart failure, HTN, GERD, 11/23/11 S/P Hysterectomy, multinodular goiter and asthma.   2004 cardiac cath by Dr Lynnell Jude in [place], [place] with one blockage noted - no stent. EF said to be normal. About 1 year ago at [facility] noted murmur and she was referred to Dr Tamala Julian EF 25-30%. More recently, she has been followed in CHF clinic.   She returns for HF follow up. Overall feeling ok. Denies SOB/PND/Orthopnea. Weight at home 187-192 pounds. Not using CPAP. Taking meds. Started walking. Plans to see dietitian.   04/03/2011  ECHO EF 25-30% 06/26/2012 ECHO EF 30-35%  02/29/2012 ECHO EF 35-40%  11/18/12 ECHO EF 55% Inferior wall mildly hypokinetic with septal HK Myoview 02/03/10 EF 42% No ischemia Septal and apical hypokinesis 11/14 Cardiac MRI  EF 52%- septal bounce suggestive of LBBB. Dyssynchrony from LBBB leads to the low calculated EF. Normal RV size and systolic function. No myocardial delayed enhancement, so no definite evidence for prior myocardial infarction, myocarditis, or infiltrative disease. 1/16 CT chest - small pleural effusions with early pulmonary edema. No ILD. 09/03/2014: ECHO EF 20-25%  6/16: ECHO EF 35-40% Cardiolite (6/16): EF 36%, no ischemia/infarction.  09/07/2015: ECHO EF 30-35%   CPX 11/20/2014  Peak VO2: 16.1 (76.8% predicted peak VO2) - when corrected to ideal BW pVO2 is 22.9 VE/VCO2 slope: 28.5 OUES: 1.59 Peak RER: 1.18  LHC/RHC 09/15/2015  RA = 2 RV = 22/1/2 PA = 27/8 (19) PCW = 8 Fick cardiac output/index = 5.22/2.8 PVR = 2.1 Ao sat = 98% PA sat = 70%, 71% Assessment: 1. Normal coronary arteries 2. Normal hemodynamics  3. LVEF 35-40%  with global HK due to   Labs 08/19/14: K 4.1 Creatinine 0.62   Labs 10/28/2014: K 4.3 Creatinine 0.73  Labs 11/19/2014: K 3.6 Creatinine 0.64  Labs 6/16: digoxin 0.5 Labs 7/16: HIV negative, TSH normal Lipids 8/16: TC 206, TG 133, HDL 29, LDL 150 Labs 08/18/2015: 9.1  Labs 09/07/2015: K 4.4 Creating 0.68   ROS: All other systems normal except as mentioned in HPI, past medical history and problem list.    Past Medical History  Diagnosis Date  . Systolic heart failure     May 2011 EF 35-40%, 04/03/11 EF 25-30%, 06/27/11 EF 30-35%  . Asthma   . Obesity   . GERD (gastroesophageal reflux disease)   . Heart murmur     dx 2 yrs ago per pt  . Seasonal allergies   . Shortness of breath     occasional - exercise induced  . Diabetes mellitus     recent dx 11/17/11 - started med 11/18/11  . Goiter 07/27/2011    non-neoplastic goiter - fine needle aspiration - benign  . Low back pain     history  . Anxiety     no meds  . Depression     no meds  . IBS (irritable bowel syndrome)     tx with diet per pt  . Herpes genitalis in women   . Hypertension     Current Outpatient Prescriptions  Medication Sig Dispense Refill  . albuterol (PROVENTIL HFA;VENTOLIN HFA) 108 (90 BASE) MCG/ACT  inhaler Inhale 1-2 puffs into the lungs every 6 (six) hours as needed for wheezing or shortness of breath. 1 Inhaler 0  . aspirin 81 MG chewable tablet Chew 81 mg by mouth every morning.     Marland Kitchen atorvastatin (LIPITOR) 40 MG tablet Take 1 tablet (40 mg total) by mouth daily. 30 tablet 6  . BAYER CONTOUR TEST test strip   1  . carvedilol (COREG) 25 MG tablet Take 1 tablet (25 mg total) by mouth 2 (two) times daily with a meal. 60 tablet 6  . DULoxetine (CYMBALTA) 30 MG capsule Take 1 capsule (30 mg total) by mouth daily. 30 capsule 3  . estradiol (ESTRACE) 1 MG tablet Take 1 tablet (1 mg total) by mouth daily. 30 tablet 11  . fluticasone (FLONASE) 50 MCG/ACT nasal spray Place 1 spray into both nostrils 2 (two) times daily  as needed for allergies or rhinitis.    . furosemide (LASIX) 40 MG tablet Take 1 tablet (40 mg total) by mouth 2 (two) times daily. 60 tablet 3  . glimepiride (AMARYL) 2 MG tablet Take 2 mg by mouth daily with breakfast.    . Insulin Pen Needle (NOVOTWIST) 32G X 5 MM MISC Use one per day to inject Victoza 50 each 3  . isosorbide-hydrALAZINE (BIDIL) 20-37.5 MG tablet Take 0.5 tablets by mouth 3 (three) times daily. 45 tablet 6  . levothyroxine (SYNTHROID, LEVOTHROID) 50 MCG tablet Take 1 tablet (50 mcg total) by mouth daily. 30 tablet 3  . metFORMIN (GLUCOPHAGE) 500 MG tablet Take 500 mg by mouth 2 (two) times daily with a meal.    . metoCLOPramide (REGLAN) 10 MG tablet Take 10 mg by mouth 4 (four) times daily as needed.     Vladimir Faster Glycol-Propyl Glycol (SYSTANE) 0.4-0.3 % SOLN Apply 1 drop to eye 2 (two) times daily as needed (dry eyes).    . sacubitril-valsartan (ENTRESTO) 24-26 MG Take 1 tablet by mouth daily.    . digoxin (LANOXIN) 0.125 MG tablet Take 1 tablet (0.125 mg total) by mouth daily. (Patient not taking: Reported on 11/16/2015) 30 tablet 6   No current facility-administered medications for this encounter.   Facility-Administered Medications Ordered in Other Encounters  Medication Dose Route Frequency Provider Last Rate Last Dose  . aminophylline injection 150 mg  150 mg Intravenous BID PRN Larey Dresser, MD   150 mg at 12/24/14 1005     Allergies  Allergen Reactions  . Shrimp [Shellfish Allergy] Anaphylaxis    Social History   Social History  . Marital Status: Legally Separated    Spouse Name: N/A  . Number of Children: Y  . 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 Smoking status: Never Smoker   . Smokeless tobacco: Never Used  . Alcohol Use: No  . Drug Use: No  . Sexual Activity: Yes    Birth Control/ Protection: Surgical   Other Topics Concern  . Not on file    Social History Narrative   She lives with her husband and son.  She is works for [employer].    Family History  Problem Relation Age of Onset  . Heart disease Maternal Aunt   . Breast cancer Mother   . Thyroid disease Mother   . Hypertension Maternal Grandmother   . Diabetes Maternal Grandmother   . Breast cancer Maternal Aunt   . Heart disease Maternal  Aunt   . Diabetes Maternal Grandfather   . Diabetes Paternal Grandmother   . Diabetes Paternal Grandfather   . Hypertension Paternal Grandfather     PHYSICAL EXAM: Filed Vitals:   11/16/15 1027  BP: 94/56  Pulse: 82   BP 94/56 mmHg  Pulse 82  Wt 190 lb 6.4 oz (86.365 kg)  SpO2 96%  General:  Well appearing. No respiratory difficulty HEENT:  Normal Neck: supple. JVP 6-7 Carotids 2+ bilat; no bruits. No lymphadenopathy. +  nodular goiter Cor: PMI nondisplaced. Regular rate & rhythm. No rubs or murmurs.  Lungs: clear Abdomen: soft, nontender, obese, nondistended. No hepatosplenomegaly. No bruits or masses. Good bowel sounds. Extremities: no cyanosis, clubbing, rash., R and LLE trace edema.  Neuro: alert & oriented x 3, cranial nerves grossly intact. moves all 4 extremities w/o difficulty. Affect pleasant.  ASSESSMENT & PLAN: 1. Chronic systolic HF: Nonischemic cardiomyopathy.  08/2015 ECHO per Dr Haroldine Laws EF 35-40%, outside ICD range (similar to cMRI).  NYHA class II-III symptoms. Volume status ok. Cut back lasix to to 40 mg daily  As she is taking entresto twice a day and BP low.   Not on spiro due to painful gynecomastia. Can consider inspra  If needed.   - On goal dose of carvedilol 25 bid.  -Can stay off dig.    - Continue entresto 24-26 mg twice a day. - 2. Chest pressure- resolved. LHC 08/2015 with normal cors.   3. HTN: low today. As above cut back lasix today.  4. OSA: Continue CPAP. Needs to use nightly 5. Multinodular goiter: TSH normal in 7/16.    6. Hyperlipidemia: Needs exercise and weight loss. Continue  atorva 40 daily (recently increased)  7. Palpitations: resolved.  8. DM II: followed by  Endocrinologist. She is being followed at [facility] for assistance with diabetes management.    Follow up in 8 weeks. Referred to HF SW for PCP.   Amy Clegg NP-C  11/16/2015

## 2015-11-18 ENCOUNTER — Telehealth: Payer: Self-pay | Admitting: Licensed Clinical Social Worker

## 2015-11-18 NOTE — Telephone Encounter (Signed)
CSW contacted patient to inform of PCP appointment scheduled for May 11 at 10:15 am in the IM clinic. Patient appreciative of assistance with obtaining requested appointment. Raquel Sarna, LCSW [phone]

## 2015-11-30 ENCOUNTER — Encounter: Payer: BLUE CROSS/BLUE SHIELD | Admitting: Nutrition

## 2015-12-02 ENCOUNTER — Ambulatory Visit: Payer: BLUE CROSS/BLUE SHIELD | Admitting: Internal Medicine

## 2015-12-14 ENCOUNTER — Other Ambulatory Visit (INDEPENDENT_AMBULATORY_CARE_PROVIDER_SITE_OTHER): Payer: BLUE CROSS/BLUE SHIELD

## 2015-12-14 DIAGNOSIS — E1165 Type 2 diabetes mellitus with hyperglycemia: Secondary | ICD-10-CM

## 2015-12-14 LAB — BASIC METABOLIC PANEL
BUN: 14 mg/dL (ref 6–23)
CHLORIDE: 105 meq/L (ref 96–112)
CO2: 28 meq/L (ref 19–32)
Calcium: 9.6 mg/dL (ref 8.4–10.5)
Creatinine, Ser: 0.71 mg/dL (ref 0.40–1.20)
GFR: 112 mL/min (ref >60.00)
Glucose, Bld: 113 mg/dL — ABNORMAL HIGH (ref 70–99)
POTASSIUM: 3.7 meq/L (ref 3.5–5.1)
Sodium: 140 mEq/L (ref 135–145)

## 2015-12-14 LAB — HEMOGLOBIN A1C: HEMOGLOBIN A1C: 7.2 % — AB (ref 4.6–6.5)

## 2015-12-23 ENCOUNTER — Ambulatory Visit (INDEPENDENT_AMBULATORY_CARE_PROVIDER_SITE_OTHER): Payer: BLUE CROSS/BLUE SHIELD | Admitting: Endocrinology

## 2015-12-23 ENCOUNTER — Encounter: Payer: Self-pay | Admitting: Endocrinology

## 2015-12-23 VITALS — BP 126/82 | HR 79 | Temp 97.9°F | Resp 16 | Ht 63.0 in | Wt 190.4 lb

## 2015-12-23 DIAGNOSIS — E042 Nontoxic multinodular goiter: Secondary | ICD-10-CM

## 2015-12-23 DIAGNOSIS — E1165 Type 2 diabetes mellitus with hyperglycemia: Secondary | ICD-10-CM | POA: Diagnosis not present

## 2015-12-23 NOTE — Patient Instructions (Addendum)
Metformin ER 2 in am and 1 at dinner  Call re: insurance coverage for shot  Check blood sugars on waking up 2-3  times a week Also check blood sugars about 2 hours after a meal and do this after different meals by rotation  Recommended blood sugar levels on waking up is 90-130 and about 2 hours after meal is 130-160  Please bring your blood sugar monitor to each visit, thank you

## 2015-12-23 NOTE — Progress Notes (Signed)
Patient ID: Susan Peck, female   DOB: 1965/08/23, 50 y.o.   MRN: [id number]           Reason for Appointment: F/u for Type 2 Diabetes  Referring physician: Elon Alas  History of Present Illness:          Date of diagnosis of type 2 diabetes mellitus: 2013 ?        Background history:   She does not remember the circumstances of her diagnosis She thinks she has been mostly taking metformin and Amaryl for her treatment and usually managed by her PCP Sugars were <150 about a year or so ago with this management but details of her previous A1c results are not available Periodically she has had difficulty affording medications also  Recent history:   Non-insulin hypoglycemic drugs the patient is taking are: Metformin ER 500 mg twice a day, Amaryl 2 mg daily   In 12/16 she was off medications for about 2 weeks because of change in insurance She thinks however that even with starting back on her metformin and Amaryl her blood sugars were higher on her initial consultation, around 300 She was recommended Victoza but because of the high out-of-pocket expense she did not start this She still is not clear what other medication is covered by her insurance  Current blood sugar patterns and problems identified:  She checks her blood sugar only once a day and only before her first meal which is variable times  She is  taking metformin ER and although she was told to increase the dose to 3 tablets she is still taking 1000 mg a day in divided doses  She thinks she is trying to be consistent with diet and also is trying to walk more than before  She is also cutting back on drinking a lot of juice although still having some green tea with sugar  Although her weight has not come down her A1c is significantly better at 7.2  Recently her fasting blood sugars are  mostly near normal.    She has a range of 80-200 overall AVERAGE 120 and only one high reading  Side effects from medications  have been: None  Compliance with the medical regimen: Fair Hypoglycemia:   none  Glucose monitoring:  done 1  times a day         Glucometer: Contour      Results as above  Self-care: The diet that the patient has been following is: tries to limit fried food and sweets .  Juice 1 cup a day, green tea Meal times 2-3 per day, dinner 6-7 pm  Typical meal intake: Breakfast is cereal or eggs                Dietician visit, most recent:never               Exercise: walking daily   Weight history:  Wt Readings from Last 3 Encounters:  12/23/15 190 lb 6.4 oz (86.365 kg)  11/16/15 190 lb 6.4 oz (86.365 kg)  11/11/15 192 lb 12.8 oz (87.454 kg)    Glycemic control:   Lab Results  Component Value Date   HGBA1C 7.2* 12/14/2015   HGBA1C 9.1* 08/18/2015   Lab Results  Component Value Date   MICROALBUR 1.4 11/09/2015   LDLCALC 115* 11/09/2015   CREATININE 0.71 12/14/2015    No visits with results within 1 Week(s) from this visit. Latest known visit with results is:  Lab on 12/14/2015  Component Date Value Ref Range Status  . Hgb A1c MFr Bld 12/14/2015 7.2* 4.6 - 6.5 % Final   Glycemic Control Guidelines for People with Diabetes:Non Diabetic:  <6%Goal of Therapy: <7%Additional Action Suggested:  >8%   . Sodium 12/14/2015 140  135 - 145 mEq/L Final  . Potassium 12/14/2015 3.7  3.5 - 5.1 mEq/L Final  . Chloride 12/14/2015 105  96 - 112 mEq/L Final  . CO2 12/14/2015 28  19 - 32 mEq/L Final  . Glucose, Bld 12/14/2015 113* 70 - 99 mg/dL Final  . BUN 12/14/2015 14  6 - 23 mg/dL Final  . Creatinine, Ser 12/14/2015 0.71  0.40 - 1.20 mg/dL Final  . Calcium 12/14/2015 9.6  8.4 - 10.5 mg/dL Final  . GFR 12/14/2015 112.00  >60.00 mL/min Final        Medication List       This list is accurate as of: 12/23/15  3:07 PM.  Always use your most recent med list.               albuterol 108 (90 Base) MCG/ACT inhaler  Commonly known as:  PROVENTIL HFA;VENTOLIN HFA  Inhale 1-2 puffs  into the lungs every 6 (six) hours as needed for wheezing or shortness of breath.     aspirin 81 MG chewable tablet  Chew 81 mg by mouth every morning.     atorvastatin 40 MG tablet  Commonly known as:  LIPITOR  Take 1 tablet (40 mg total) by mouth daily.     BAYER CONTOUR TEST test strip  Generic drug:  glucose blood     carvedilol 25 MG tablet  Commonly known as:  COREG  Take 1 tablet (25 mg total) by mouth 2 (two) times daily with a meal.     DULoxetine 30 MG capsule  Commonly known as:  CYMBALTA  Take 1 capsule (30 mg total) by mouth daily.     ENTRESTO 24-26 MG  Generic drug:  sacubitril-valsartan  Take 1 tablet by mouth daily.     estradiol 1 MG tablet  Commonly known as:  ESTRACE  Take 1 tablet (1 mg total) by mouth daily.     FLONASE 50 MCG/ACT nasal spray  Generic drug:  fluticasone  Place 1 spray into both nostrils 2 (two) times daily as needed for allergies or rhinitis.     furosemide 40 MG tablet  Commonly known as:  LASIX  Take 1 tablet (40 mg total) by mouth daily.     glimepiride 2 MG tablet  Commonly known as:  AMARYL  Take 2 mg by mouth daily with breakfast.     Insulin Pen Needle 32G X 5 MM Misc  Commonly known as:  NOVOTWIST  Use one per day to inject Victoza     isosorbide-hydrALAZINE 20-37.5 MG tablet  Commonly known as:  BIDIL  Take 0.5 tablets by mouth 3 (three) times daily.     levothyroxine 50 MCG tablet  Commonly known as:  SYNTHROID, LEVOTHROID  Take 1 tablet (50 mcg total) by mouth daily.     metFORMIN 500 MG tablet  Commonly known as:  GLUCOPHAGE  Take 500 mg by mouth 2 (two) times daily with a meal.     metoCLOPramide 10 MG tablet  Commonly known as:  REGLAN  Take 10 mg by mouth 4 (four) times daily as needed. Reported on 12/23/2015     SYSTANE 0.4-0.3 % Soln  Generic drug:  Polyethyl Glycol-Propyl Glycol  Apply 1 drop to  eye 2 (two) times daily as needed (dry eyes).        Allergies:  Allergies  Allergen Reactions  .  Shrimp [Shellfish Allergy] Anaphylaxis    Past Medical History  Diagnosis Date  . Systolic heart failure     May 2011 EF 35-40%, 04/03/11 EF 25-30%, 06/27/11 EF 30-35%  . Asthma   . Obesity   . GERD (gastroesophageal reflux disease)   . Heart murmur     dx 2 yrs ago per pt  . Seasonal allergies   . Shortness of breath     occasional - exercise induced  . Diabetes mellitus     recent dx 11/17/11 - started med 11/18/11  . Goiter 07/27/2011    non-neoplastic goiter - fine needle aspiration - benign  . Low back pain     history  . Anxiety     no meds  . Depression     no meds  . IBS (irritable bowel syndrome)     tx with diet per pt  . Herpes genitalis in women   . Hypertension     Past Surgical History  Procedure Laterality Date  . Tubal ligation    . Diagnostic laparoscopy      of pelvis  . Novasure ablation      10/2005  . Dilation and curettage of uterus  12/2006,  10/2005    hysteroscopy surgery x 2  . Svd      x 3  . Wisdom tooth extraction    . Colonscopy    . Cardiac catherization  2004    [place], [place] - Dr Lynnell Jude  . Laparoscopic assisted vaginal hysterectomy  11/23/2011    Procedure: LAPAROSCOPIC ASSISTED VAGINAL HYSTERECTOMY;  Surgeon: Jolayne Haines, MD;  Location: [facility];  Service: Gynecology;  Laterality: N/A;  . Cystoscopy  11/23/2011    Procedure: CYSTOSCOPY;  Surgeon: Jolayne Haines, MD;  Location: [facility];  Service: Gynecology;  Laterality: N/A;  . Salpingoophorectomy  11/23/2011    Procedure: SALPINGO OOPHERECTOMY;  Surgeon: Jolayne Haines, MD;  Location: [facility];  Service: Gynecology;  Laterality: Bilateral;  . Abdominal hysterectomy    . Cardiac catheterization    . Colonoscopy with propofol N/A 03/26/2015    Procedure: COLONOSCOPY WITH PROPOFOL;  Surgeon: Carol Ada, MD;  Location: [facility];  Service: Endoscopy;  Laterality: N/A;  . Cardiac catheterization N/A 09/15/2015    Procedure: Right/Left Heart Cath and Coronary Angiography;  Surgeon: Jolaine Artist, MD;  Location: [facility];  Service: Cardiovascular;  Laterality: N/A;    Family History  Problem Relation Age of Onset  . Heart disease Maternal Aunt   . Breast cancer Mother   . Thyroid disease Mother   . Hypertension Maternal Grandmother   . Diabetes Maternal Grandmother   . Breast cancer Maternal Aunt   . Heart disease Maternal Aunt   . Diabetes Maternal Grandfather   . Diabetes Paternal Grandmother   . Diabetes Paternal Grandfather   . Hypertension Paternal Grandfather     Social History:  reports that she has never smoked. She has never used smokeless tobacco. She reports that she does not drink alcohol or use illicit drugs.    Review of Systems   GOITER thyroid enlargement was first discovered in 2012, probably on a routine exam  Her ultrasound examination in 2012 showed multiple nodules with the largest nodule 24 mm on the right side and 11 mm on the left The largest nodule was  3.3cm in 2016  She has had difficulty with swallowing at times, no certain foods  Does feel like she has a slight choking sensation in her neck when she is lying down on the right side; also occasional difficulty swallowing  She was started on thyroid supplementation 50 g to help limit the size of her goiter    Lab Results  Component Value Date   TSH 0.97 11/09/2015   TSH 0.768 05/17/2015   TSH 1.742 02/09/2015   FREET4 0.98 11/09/2015   FREET4 1.33* 05/17/2015   FREET4 1.24 07/10/2011      Lipid history: on treatment with Lipitor 40 mg followed by PCP    Lab Results  Component Value Date   CHOL 192 11/09/2015   HDL 39.50 11/09/2015   LDLCALC 115* 11/09/2015   TRIG 189.0* 11/09/2015   CHOLHDL 5 11/09/2015           Hypertension: Present but mostly on medications for her CHF  Most recent eye exam was years ago, cannot afford this   No results found for: [id number]   Most recent foot exam: 08/2015  She has had pains in her legs not helped by  gabapentin, has discussed with PCP  Review of Systems   LABS:  No visits with results within 1 Week(s) from this visit. Latest known visit with results is:  Lab on 12/14/2015  Component Date Value Ref Range Status  . Hgb A1c MFr Bld 12/14/2015 7.2* 4.6 - 6.5 % Final   Glycemic Control Guidelines for People with Diabetes:Non Diabetic:  <6%Goal of Therapy: <7%Additional Action Suggested:  >8%   . Sodium 12/14/2015 140  135 - 145 mEq/L Final  . Potassium 12/14/2015 3.7  3.5 - 5.1 mEq/L Final  . Chloride 12/14/2015 105  96 - 112 mEq/L Final  . CO2 12/14/2015 28  19 - 32 mEq/L Final  . Glucose, Bld 12/14/2015 113* 70 - 99 mg/dL Final  . BUN 12/14/2015 14  6 - 23 mg/dL Final  . Creatinine, Ser 12/14/2015 0.71  0.40 - 1.20 mg/dL Final  . Calcium 12/14/2015 9.6  8.4 - 10.5 mg/dL Final  . GFR 12/14/2015 112.00  >60.00 mL/min Final    Physical Examination:  BP 126/82 mmHg  Pulse 79  Temp(Src) 97.9 F (36.6 C)  Resp 16  Ht 5\' 3"  (1.6 m)  Wt 190 lb 6.4 oz (86.365 kg)  BMI 33.74 kg/m2  SpO2 96%       ASSESSMENT:  Diabetes type 2, uncontrolled    See history of present illness for detailed discussion of current diabetes management, blood sugar patterns and problems identified  Her A1c now 7.2 and blood sugars are excellent at home However still not doing any postprandial readings as directed Currently only on 1000 mg of metformin ER and Amaryl More recently has been able to start some walking    PLAN:     Since she does need weight loss she will call back to let us know which GLP-1 drug discovered by insurance and this can be used in place of Amaryl  Start checking readings after meals more often than in the morning  Metformin ER 1500 mg daily, to increase morning dose by 1 tablet     Patient Instructions  Metformin ER 2 in am and 1 at dinner  Call re: insurance coverage for shot  Check blood sugars on waking up 2-3  times a week Also check blood sugars about 2  hours after a meal and do this  after different meals by rotation  Recommended blood sugar levels on waking up is 90-130 and about 2 hours after meal is 130-160  Please bring your blood sugar monitor to each visit, thank you       [facility] 12/23/2015, 3:07 PM   Note: This office note was prepared with Dragon voice recognition system technology. Any transcriptional errors that result from this process are unintentional.

## 2015-12-24 ENCOUNTER — Other Ambulatory Visit: Payer: Self-pay | Admitting: Family Medicine

## 2015-12-24 DIAGNOSIS — Z1231 Encounter for screening mammogram for malignant neoplasm of breast: Secondary | ICD-10-CM

## 2015-12-28 ENCOUNTER — Ambulatory Visit: Payer: BLUE CROSS/BLUE SHIELD | Admitting: Dietician

## 2015-12-28 ENCOUNTER — Encounter: Payer: Self-pay | Admitting: Internal Medicine

## 2015-12-28 ENCOUNTER — Ambulatory Visit (INDEPENDENT_AMBULATORY_CARE_PROVIDER_SITE_OTHER): Payer: BLUE CROSS/BLUE SHIELD | Admitting: Internal Medicine

## 2015-12-28 ENCOUNTER — Ambulatory Visit (HOSPITAL_COMMUNITY)
Admission: RE | Admit: 2015-12-28 | Discharge: 2015-12-28 | Disposition: A | Discharge status: Home / Self Care | Payer: BLUE CROSS/BLUE SHIELD | Source: Ambulatory Visit | Attending: Student in an Organized Health Care Education/Training Program | Admitting: Student in an Organized Health Care Education/Training Program

## 2015-12-28 VITALS — BP 164/97 | HR 72 | Temp 98.1°F | Ht 63.0 in | Wt 189.1 lb

## 2015-12-28 DIAGNOSIS — M47897 Other spondylosis, lumbosacral region: Secondary | ICD-10-CM | POA: Diagnosis not present

## 2015-12-28 DIAGNOSIS — M25552 Pain in left hip: Secondary | ICD-10-CM | POA: Insufficient documentation

## 2015-12-28 DIAGNOSIS — G8929 Other chronic pain: Secondary | ICD-10-CM

## 2015-12-28 DIAGNOSIS — I10 Essential (primary) hypertension: Secondary | ICD-10-CM | POA: Diagnosis not present

## 2015-12-28 DIAGNOSIS — R0789 Other chest pain: Secondary | ICD-10-CM | POA: Diagnosis not present

## 2015-12-28 DIAGNOSIS — E1169 Type 2 diabetes mellitus with other specified complication: Secondary | ICD-10-CM

## 2015-12-28 DIAGNOSIS — IMO0002 Reserved for concepts with insufficient information to code with codable children: Secondary | ICD-10-CM

## 2015-12-28 DIAGNOSIS — M7918 Myalgia, other site: Secondary | ICD-10-CM | POA: Insufficient documentation

## 2015-12-28 DIAGNOSIS — E1165 Type 2 diabetes mellitus with hyperglycemia: Secondary | ICD-10-CM

## 2015-12-28 DIAGNOSIS — I159 Secondary hypertension, unspecified: Secondary | ICD-10-CM

## 2015-12-28 DIAGNOSIS — M25559 Pain in unspecified hip: Secondary | ICD-10-CM

## 2015-12-28 LAB — GLUCOSE, CAPILLARY: Glucose-Capillary: 138 mg/dL — ABNORMAL HIGH (ref 65–99)

## 2015-12-28 MED ORDER — DICLOFENAC SODIUM 1 % TD GEL: 2.0000 g | Freq: Four times a day (QID) | TRANSDERMAL | Status: DC

## 2015-12-28 NOTE — Patient Instructions (Signed)
Please use voltaren gel on your neck/chest area for pain.  Keep your feet moisturized with vaseline.  Will get xray of your back.  Follow up in 3 months.

## 2015-12-28 NOTE — Assessment & Plan Note (Addendum)
Has left lateral hip radiating down her entire leg down to the foot. Has some left sole numbness, going on for10 years. Along with also back pain. Pain seems radicular by history. Does have L5-S1 disc bulge hx on 2007 MRI. No recent imaging done. No alarm signs.  Will get lumbar xray for now.

## 2015-12-28 NOTE — Progress Notes (Signed)
   Subjective:    Patient ID: Susan Peck, female    DOB: April 27, 1966, 50 y.o.   MRN: [id number]  HPI  50 yo female with hx of chronic systolic CHF EF 99991111 2/2 to NICM follows with Dr. Haroldine Laws, DM II hgba1c 7.2, HTN, hypothyroidism, depression/anxiety, asthma, obesity, presents to establish care as a new patient to our clinic.  DM II: last hgba1c 2 weeks ago 7.2. currenlty on metformin 1000 mg ER, was increased to 1500mg  ER by Dr. Dwyane Dee yesterday, also on glimepiride 2mg . They discussed starting GLP-1 agonist for further DM II control and also to have some weight loss. Patient is not interested in more medications. Wants to try lifestyle modifications.   HTN/CHF: on asa 19 (not sure why she needs this if she has NICM) + coreg 25mg  bid + Entresto + lasix 40mg  daily + Bidil 20-37.5 TID. Follows with Dr. Sung Amabile.   Has some pain over the clavicle for 3 weeks. No injury, no rash. Tried ice which eased. Sharp and crushing. No coughing, no sob with this.   Has some left sided hip pain with radiation down her entire leg, mainly lateral hip. Has occasional back pain. Some numbness on the left sole. No weakness. No bowel or bladder loss. Had this going on for last 10 years. MRI on 11/2005 showed L5-S1 diffuse bulging disc. No recent imaging.    Review of Systems  Constitutional: Negative for fever and chills.  HENT: Negative for congestion and sore throat.   Respiratory: Negative for chest tightness and shortness of breath.   Cardiovascular: Negative for chest pain, palpitations and leg swelling.  Gastrointestinal: Negative for abdominal pain and abdominal distention.  Genitourinary: Negative for dysuria and flank pain.  Musculoskeletal: Positive for back pain and arthralgias. Negative for myalgias, joint swelling, neck pain and neck stiffness.  Neurological: Positive for numbness. Negative for dizziness and weakness.  Hematological: Negative for adenopathy.       Objective:   Physical  Exam  Constitutional: She is oriented to person, place, and time. She appears well-developed and well-nourished. No distress.  HENT:  Head: Normocephalic and atraumatic.  Eyes: EOM are normal. Pupils are equal, round, and reactive to light. Right eye exhibits no discharge. Left eye exhibits no discharge. No scleral icterus.  Neck: Normal range of motion.  Some tenderness over left cervical muscle. Some tenderness over right upper chest muscle, not clavicle.   Cardiovascular: Normal rate and regular rhythm.  Exam reveals no gallop and no friction rub.   No murmur heard. Pulmonary/Chest: Effort normal and breath sounds normal.  Abdominal: Soft. Bowel sounds are normal. She exhibits no distension. There is no tenderness.  Musculoskeletal:  Full ROM on upper exts. Normal str.  Full ROM lower exts. Negative straight leg and cross leg test. No effusion.  Neurological: She is alert and oriented to person, place, and time.  Skin: She is not diaphoretic.     Filed Vitals:   12/28/15 1049  BP: 164/97  Pulse: 72  Temp: 98.1 F (36.7 C)        Assessment & Plan:  See problem based a&p

## 2015-12-28 NOTE — Assessment & Plan Note (Signed)
Being managed by Dr. Dwyane Dee. Saw him yesterday and increased metformin dose. Will defer further management to Dr. Dwyane Dee. hgba1c 7.2.

## 2015-12-28 NOTE — Assessment & Plan Note (Signed)
Likely has right upper chest muscle/intercoastal pain. No injury to suggest any clavicle fracture. Normal MSK exam other than some tenderness over the upper chest wall muscle.  Will try voltaren gel.

## 2015-12-28 NOTE — Assessment & Plan Note (Signed)
Filed Vitals:   12/28/15 1049  BP: 164/97  Pulse: 72  Temp: 98.1 F (36.7 C)   BP remains elevated due to not taking her BP med this morning.  Will not change her regimen. Cont coreg + bidil + lasix + entresto.

## 2015-12-30 NOTE — Progress Notes (Signed)
Internal Medicine Clinic Attending  Case discussed with Dr. Genene Churn at the time of the visit.  We reviewed the resident's history and exam and pertinent patient test results.  I agree with the assessment, diagnosis, and plan of care documented in the resident's note. Patient presents for follow up of hypertension.

## 2016-01-04 ENCOUNTER — Other Ambulatory Visit: Payer: Self-pay | Admitting: Gynecology

## 2016-01-04 ENCOUNTER — Ambulatory Visit
Admission: RE | Admit: 2016-01-04 | Discharge: 2016-01-04 | Disposition: A | Discharge status: Home / Self Care | Payer: BLUE CROSS/BLUE SHIELD | Source: Ambulatory Visit | Attending: Family Medicine | Admitting: Family Medicine

## 2016-01-04 DIAGNOSIS — Z1231 Encounter for screening mammogram for malignant neoplasm of breast: Secondary | ICD-10-CM

## 2016-01-11 ENCOUNTER — Ambulatory Visit (HOSPITAL_COMMUNITY)
Admission: RE | Admit: 2016-01-11 | Discharge: 2016-01-11 | Disposition: A | Discharge status: Home / Self Care | Payer: BLUE CROSS/BLUE SHIELD | Source: Ambulatory Visit | Attending: Internal Medicine | Admitting: Internal Medicine

## 2016-01-11 VITALS — BP 160/100 | HR 77 | Wt 195.0 lb

## 2016-01-11 DIAGNOSIS — Z803 Family history of malignant neoplasm of breast: Secondary | ICD-10-CM | POA: Diagnosis not present

## 2016-01-11 DIAGNOSIS — K219 Gastro-esophageal reflux disease without esophagitis: Secondary | ICD-10-CM | POA: Insufficient documentation

## 2016-01-11 DIAGNOSIS — Z91013 Allergy to seafood: Secondary | ICD-10-CM | POA: Diagnosis not present

## 2016-01-11 DIAGNOSIS — E785 Hyperlipidemia, unspecified: Secondary | ICD-10-CM | POA: Diagnosis not present

## 2016-01-11 DIAGNOSIS — Z7982 Long term (current) use of aspirin: Secondary | ICD-10-CM | POA: Diagnosis not present

## 2016-01-11 DIAGNOSIS — E669 Obesity, unspecified: Secondary | ICD-10-CM | POA: Insufficient documentation

## 2016-01-11 DIAGNOSIS — Z6834 Body mass index (BMI) 34.0-34.9, adult: Secondary | ICD-10-CM | POA: Diagnosis not present

## 2016-01-11 DIAGNOSIS — J45909 Unspecified asthma, uncomplicated: Secondary | ICD-10-CM | POA: Diagnosis not present

## 2016-01-11 DIAGNOSIS — I110 Hypertensive heart disease with heart failure: Secondary | ICD-10-CM | POA: Insufficient documentation

## 2016-01-11 DIAGNOSIS — K589 Irritable bowel syndrome without diarrhea: Secondary | ICD-10-CM | POA: Diagnosis not present

## 2016-01-11 DIAGNOSIS — I5022 Chronic systolic (congestive) heart failure: Secondary | ICD-10-CM | POA: Diagnosis not present

## 2016-01-11 DIAGNOSIS — G4733 Obstructive sleep apnea (adult) (pediatric): Secondary | ICD-10-CM | POA: Insufficient documentation

## 2016-01-11 DIAGNOSIS — Z79899 Other long term (current) drug therapy: Secondary | ICD-10-CM | POA: Diagnosis not present

## 2016-01-11 DIAGNOSIS — E119 Type 2 diabetes mellitus without complications: Secondary | ICD-10-CM | POA: Insufficient documentation

## 2016-01-11 DIAGNOSIS — Z8349 Family history of other endocrine, nutritional and metabolic diseases: Secondary | ICD-10-CM | POA: Insufficient documentation

## 2016-01-11 DIAGNOSIS — E042 Nontoxic multinodular goiter: Secondary | ICD-10-CM | POA: Insufficient documentation

## 2016-01-11 DIAGNOSIS — Z7984 Long term (current) use of oral hypoglycemic drugs: Secondary | ICD-10-CM | POA: Diagnosis not present

## 2016-01-11 DIAGNOSIS — I159 Secondary hypertension, unspecified: Secondary | ICD-10-CM

## 2016-01-11 DIAGNOSIS — I428 Other cardiomyopathies: Secondary | ICD-10-CM | POA: Diagnosis not present

## 2016-01-11 NOTE — Patient Instructions (Signed)
Labs needed in 1 week  Your physician recommends that you schedule a follow-up appointment in: 6-8 weeks  Do the following things EVERYDAY: 1) Weigh yourself in the morning before breakfast. Write it down and keep it in a log. 2) Take your medicines as prescribed 3) Eat low salt foods-Limit salt (sodium) to 2000 mg per day.  4) Stay as active as you can everyday 5) Limit all fluids for the day to less than 2 liters 6)

## 2016-01-11 NOTE — Progress Notes (Signed)
Advanced Heart Failure Medication Review by a Pharmacist  Does the patient  feel that his/her medications are working for him/her?  yes  Has the patient been experiencing any side effects to the medications prescribed?  no  Does the patient measure his/her own blood pressure or blood glucose at home?  no   Does the patient have any problems obtaining medications due to transportation or finances?   no  Understanding of regimen: poor Understanding of indications: poor Potential of compliance: poor Patient understands to avoid NSAIDs. Patient understands to avoid decongestants.  Issues to address at subsequent visits: Patient medication compliance    Pharmacist comments: [name] is a pleasant 64 yof presenting to clinic for follow-up. She admits to medication non-compliance and has been frequently running out of medications. She denies any medication-related side effects at this time. She does endorse some dizziness when standing up too quickly and excessive fluid in her legs/feet, but denies SOB. Discussed medication compliance and adjusted medication history accordingly. She is being set up with paramedicine (laso had in the past) to assit with her medications. She did not have any other medication-related questions or concerns at this time.   Susan Peck, PharmD Pharmacy Resident  Pager: [phone] 01/11/2016 12:06 PM  Time with patient: 15 min  Preparation and documentation time: 5 min  Total time: 20 min

## 2016-01-11 NOTE — Progress Notes (Signed)
ADVANCED HF CLINIC NOTE  Patient ID: Susan Peck, female   DOB: 07/13/1966, 50 y.o.   MRN: [id number]  PCP: Dr Rae Mar GYN: Dr Carolin Guernsey Pulmonologist: Dr Gwenette Greet  HPI:  Susan Peck is a 50 year old African American female with systolic heart failure, HTN, GERD, 11/23/11 S/P Hysterectomy, multinodular goiter and asthma.   2004 cardiac cath by Dr Lynnell Jude in [place], [place] with one blockage noted - no stent. EF said to be normal. About 1 year ago at [facility] noted murmur and she was referred to Dr Tamala Julian EF 25-30%. More recently, she has been followed in CHF clinic.   She returns for HF follow up. Overall feeling ok. Denies SOB/PND/Orthopnea.Mlid dyspnea with steps.  Weight at home trending up 193 pounds. Not using CPAP. Has been out of medications for 2 weeks. Says she ran out.  Started walking. Plans to see dietitian June 27th. Medicaid pending.   04/03/2011  ECHO EF 25-30% 06/26/2012 ECHO EF 30-35%  02/29/2012 ECHO EF 35-40%  11/18/12 ECHO EF 55% Inferior wall mildly hypokinetic with septal HK Myoview 02/03/10 EF 42% No ischemia Septal and apical hypokinesis 11/14 Cardiac MRI  EF 52%- septal bounce suggestive of LBBB. Dyssynchrony from LBBB leads to the low calculated EF. Normal RV size and systolic function. No myocardial delayed enhancement, so no definite evidence for prior myocardial infarction, myocarditis, or infiltrative disease. 1/16 CT chest - small pleural effusions with early pulmonary edema. No ILD. 09/03/2014: ECHO EF 20-25%  6/16: ECHO EF 35-40% Cardiolite (6/16): EF 36%, no ischemia/infarction.  09/07/2015: ECHO EF 30-35%   CPX 11/20/2014  Peak VO2: 16.1 (76.8% predicted peak VO2) - when corrected to ideal BW pVO2 is 22.9 VE/VCO2 slope: 28.5 OUES: 1.59 Peak RER: 1.18  LHC/RHC 09/15/2015  RA = 2 RV = 22/1/2 PA = 27/8 (19) PCW = 8 Fick cardiac output/index = 5.22/2.8 PVR = 2.1 Ao sat = 98% PA sat = 70%, 71% Assessment: 1. Normal coronary arteries 2. Normal hemodynamics   3. LVEF 35-40% with global HK due to   Labs 08/19/14: K 4.1 Creatinine 0.62   Labs 10/28/2014: K 4.3 Creatinine 0.73  Labs 11/19/2014: K 3.6 Creatinine 0.64  Labs 6/16: digoxin 0.5 Labs 7/16: HIV negative, TSH normal Lipids 8/16: TC 206, TG 133, HDL 29, LDL 150 Labs 08/18/2015: 9.1  Labs 09/07/2015: K 4.4 Creating 0.68  Lab 12/14/2015: K 3.7 Creatinine 0.71   ROS: All other systems normal except as mentioned in HPI, past medical history and problem list.    Past Medical History  Diagnosis Date  . Systolic heart failure     May 2011 EF 35-40%, 04/03/11 EF 25-30%, 06/27/11 EF 30-35%  . Asthma   . Obesity   . GERD (gastroesophageal reflux disease)   . Heart murmur     dx 2 yrs ago per pt  . Seasonal allergies   . Shortness of breath     occasional - exercise induced  . Diabetes mellitus     recent dx 11/17/11 - started med 11/18/11  . Goiter 07/27/2011    non-neoplastic goiter - fine needle aspiration - benign  . Low back pain     history  . Anxiety     no meds  . Depression     no meds  . IBS (irritable bowel syndrome)     tx with diet per pt  . Herpes genitalis in women   . Hypertension     Current Outpatient Prescriptions  Medication Sig Dispense Refill  .  albuterol (PROVENTIL HFA;VENTOLIN HFA) 108 (90 BASE) MCG/ACT inhaler Inhale 1-2 puffs into the lungs every 6 (six) hours as needed for wheezing or shortness of breath. 1 Inhaler 0  . aspirin 81 MG chewable tablet Chew 81 mg by mouth every morning.     Marland Kitchen atorvastatin (LIPITOR) 40 MG tablet Take 1 tablet (40 mg total) by mouth daily. 30 tablet 6  . BAYER CONTOUR TEST test strip   1  . carvedilol (COREG) 25 MG tablet Take 1 tablet (25 mg total) by mouth 2 (two) times daily with a meal. 60 tablet 6  . diclofenac sodium (VOLTAREN) 1 % GEL Apply 2 g topically 4 (four) times daily. 100 g 2  . DULoxetine (CYMBALTA) 30 MG capsule Take 1 capsule (30 mg total) by mouth daily. 30 capsule 3  . estradiol (ESTRACE) 1 MG tablet Take 1  tablet (1 mg total) by mouth daily. 30 tablet 11  . fluticasone (FLONASE) 50 MCG/ACT nasal spray Place 1 spray into both nostrils 2 (two) times daily as needed for allergies or rhinitis.    . furosemide (LASIX) 40 MG tablet Take 1 tablet (40 mg total) by mouth daily. 30 tablet 3  . glimepiride (AMARYL) 2 MG tablet Take 2 mg by mouth daily with breakfast.    . Insulin Pen Needle (NOVOTWIST) 32G X 5 MM MISC Use one per day to inject Victoza 50 each 3  . isosorbide-hydrALAZINE (BIDIL) 20-37.5 MG tablet Take 0.5 tablets by mouth 3 (three) times daily. 45 tablet 6  . levothyroxine (SYNTHROID, LEVOTHROID) 50 MCG tablet Take 1 tablet (50 mcg total) by mouth daily. 30 tablet 3  . metFORMIN (GLUCOPHAGE) 500 MG tablet Take 500 mg by mouth 2 (two) times daily with a meal.    . metoCLOPramide (REGLAN) 10 MG tablet Take 10 mg by mouth 4 (four) times daily as needed. Reported on 12/23/2015    . Polyethyl Glycol-Propyl Glycol (SYSTANE) 0.4-0.3 % SOLN Apply 1 drop to eye 2 (two) times daily as needed (dry eyes).    . sacubitril-valsartan (ENTRESTO) 24-26 MG Take 1 tablet by mouth daily.     No current facility-administered medications for this encounter.   Facility-Administered Medications Ordered in Other Encounters  Medication Dose Route Frequency Provider Last Rate Last Dose  . aminophylline injection 150 mg  150 mg Intravenous BID PRN Larey Dresser, MD   150 mg at 12/24/14 1005     Allergies  Allergen Reactions  . Shrimp [Shellfish Allergy] Anaphylaxis    Social History   Social History  . Marital Status: Legally Separated    Spouse Name: N/A  . Number of Children: Y  . 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 Smoking status: Never Smoker   . Smokeless tobacco: Never Used  . Alcohol Use: No  . Drug Use: No  . Sexual Activity: Not on file   Other Topics Concern  . Not on file   Social History  Narrative   She lives with her husband and son.  She is works for [employer].    Family History  Problem Relation Age of Onset  . Heart disease Maternal Aunt   . Breast cancer Mother   . Thyroid disease Mother   . Hypertension Maternal Grandmother   . Diabetes Maternal Grandmother   . Breast cancer Maternal Aunt   . Heart disease Maternal Aunt   .  Diabetes Maternal Grandfather   . Diabetes Paternal Grandmother   . Diabetes Paternal Grandfather   . Hypertension Paternal Grandfather     PHYSICAL EXAM: Filed Vitals:   01/11/16 1036  BP: 160/100  Pulse: 77   BP 160/100 mmHg  Pulse 77  Wt 195 lb (88.451 kg)  SpO2 92%  General:  Well appearing. No respiratory difficulty HEENT:  Normal Neck: supple. JVP 6-7 Carotids 2+ bilat; no bruits. No lymphadenopathy. +  nodular goiter Cor: PMI nondisplaced. Regular rate & rhythm. No rubs or murmurs.  Lungs: clear Abdomen: soft, nontender, obese, nondistended. No hepatosplenomegaly. No bruits or masses. Good bowel sounds. Extremities: no cyanosis, clubbing, rash., R and LLE trace edema.  Neuro: alert & oriented x 3, cranial nerves grossly intact. moves all 4 extremities w/o difficulty. Affect pleasant.  ASSESSMENT & PLAN: 1. Chronic systolic HF: Nonischemic cardiomyopathy.  08/2015 ECHO per Dr Haroldine Laws EF 35-40%, outside ICD range (similar to cMRI).  NYHA class II-III symptoms. Volume status ok. Continue lasix to to 40 mg daily.    Not on spiro due to painful gynecomastia. Can consider inspra  If needed.   - On goal dose of carvedilol 25 bid.  -Can stay off dig.    - Continue entresto 24-26 mg twice a day. - 2. Chest pressure- resolved. LHC 08/2015 with normal cors.   3. HTN: Elevated. As above cut back lasix today.  4. OSA: Continue CPAP. Needs to use nightly 5. Multinodular goiter: TSH normal in 7/16.    6. Hyperlipidemia: Needs exercise and weight loss. Continue atorva 40 daily (recently increased)  7. Palpitations: resolved.  8. DM  II: followed by  Endocrinologist. She is being followed at [facility] for assistance with diabetes management.   Today BP running high be she has been out meds for over 2 weeks. Restart all meds today.   Follow up in 6 weeks. Check BMET and BP next week.   Amy Clegg NP-C  01/11/2016

## 2016-01-12 ENCOUNTER — Telehealth: Payer: Self-pay | Admitting: Endocrinology

## 2016-01-12 NOTE — Telephone Encounter (Signed)
Pt [facility] [address]

## 2016-01-14 ENCOUNTER — Telehealth: Payer: Self-pay | Admitting: Endocrinology

## 2016-01-14 MED ORDER — LEVOTHYROXINE SODIUM 50 MCG PO TABS: 50.0000 ug | ORAL_TABLET | Freq: Every day | ORAL | Status: DC

## 2016-01-14 NOTE — Telephone Encounter (Signed)
Pt needs [place] to call in [facility] has been waiting since the 21st

## 2016-01-14 NOTE — Addendum Note (Signed)
Addended by: Verlin Grills T on: 01/14/2016 04:59 PM   Modules accepted: Orders

## 2016-01-14 NOTE — Telephone Encounter (Signed)
Rx submitted per pt's request.  

## 2016-01-17 ENCOUNTER — Ambulatory Visit (HOSPITAL_COMMUNITY)
Admission: RE | Admit: 2016-01-17 | Discharge: 2016-01-17 | Disposition: A | Discharge status: Home / Self Care | Payer: BLUE CROSS/BLUE SHIELD | Source: Ambulatory Visit | Attending: Cardiology | Admitting: Cardiology

## 2016-01-17 DIAGNOSIS — I5022 Chronic systolic (congestive) heart failure: Secondary | ICD-10-CM | POA: Insufficient documentation

## 2016-01-17 DIAGNOSIS — I5023 Acute on chronic systolic (congestive) heart failure: Secondary | ICD-10-CM

## 2016-01-17 LAB — BASIC METABOLIC PANEL
Anion gap: 7 (ref 5–15)
BUN: 10 mg/dL (ref 6–20)
CALCIUM: 9.1 mg/dL (ref 8.9–10.3)
CHLORIDE: 104 mmol/L (ref 101–111)
CO2: 27 mmol/L (ref 22–32)
CREATININE: 0.64 mg/dL (ref 0.44–1.00)
GFR calc non Af Amer: >60 mL/min (ref >60)
Glucose, Bld: 138 mg/dL — ABNORMAL HIGH (ref 65–99)
Potassium: 4.1 mmol/L (ref 3.5–5.1)
SODIUM: 138 mmol/L (ref 135–145)

## 2016-01-18 ENCOUNTER — Encounter: Payer: Self-pay | Admitting: Dietician

## 2016-01-18 ENCOUNTER — Encounter: Payer: BLUE CROSS/BLUE SHIELD | Attending: Endocrinology | Admitting: Dietician

## 2016-01-18 VITALS — Ht 64.0 in | Wt 189.0 lb

## 2016-01-18 DIAGNOSIS — E1165 Type 2 diabetes mellitus with hyperglycemia: Secondary | ICD-10-CM | POA: Diagnosis not present

## 2016-01-18 DIAGNOSIS — IMO0002 Reserved for concepts with insufficient information to code with codable children: Secondary | ICD-10-CM

## 2016-01-18 DIAGNOSIS — E118 Type 2 diabetes mellitus with unspecified complications: Secondary | ICD-10-CM

## 2016-01-18 NOTE — Patient Instructions (Signed)
Plan:  Choose fresh fruit rather than juice or decrease your portion to 1/2 cup occasionally. Aim for 2-3 Carb Choices per meal (30-45 grams) +/- 1 either way  Aim for 0-1 Carbs per snack if hungry  Include protein in moderation with your meals and snacks Consider reading food labels for Total Carbohydrate and Fat Grams of foods as well as sodium. Avoid processed meats, choose fresh, frozen or canned without salt. Consider  increasing your activity level by walking for 30 minutes daily as tolerated Consider checking BG at alternate times per day as directed by MD  Consider taking medication as directed by MD

## 2016-01-18 NOTE — Progress Notes (Signed)
Diabetes Self-Management Education  Visit Type: First/Initial  Appt. Start Time: 0930 Appt. End Time: 1100  01/18/2016  [name], identified by name and date of birth, is a 50 y.o. female with a diagnosis of Diabetes: Type 2. Other hx includes GERD, HTN, goiter, systolic heart failure and IBS.  She currently takes Amaryl and Glucophage but misses these at times when she does not eat.  She has had education regarding insulin but is refusing this.    Patient is here today with her cousin who lives with her.  Both share shopping and cooking.  She eats out at times. She does not add additional salt but diet is relatively high sodium.  She is unemployed.  ASSESSMENT  Height 5\' 4"  (1.626 m), weight 189 lb (85.73 kg). Body mass index is 32.43 kg/(m^2).  Goal weight 175 lbs. Lowest adult weight 170 Highest adult weight 195 but "due to fluid"      Diabetes Self-Management Education - 01/18/16 0954    Visit Information   Visit Type First/Initial   Initial Visit   Diabetes Type Type 2   Are you currently following a meal plan? No   Are you taking your medications as prescribed? No  some days she does not eat so does not take her medication   Date Diagnosed 2013?   Health Coping   How would you rate your overall health? Fair   Psychosocial Assessment   Patient Belief/Attitude about Diabetes Denial   Self-management support Doctor's office;Family   Other persons present Patient   Patient Concerns Glycemic Control;Nutrition/Meal planning   Special Needs None   Preferred Learning Style Visual;Hands on   Learning Readiness Ready   How often do you need to have someone help you when you read instructions, pamphlets, or other written materials from your doctor or pharmacy? 1 - Never   What is the last grade level you completed in school? 4 years college   Pre-Education Assessment   Patient understands the diabetes disease and treatment process. Needs Review   Patient understands  incorporating nutritional management into lifestyle. Needs Review   Patient undertands incorporating physical activity into lifestyle. Needs Review   Patient understands using medications safely. Needs Review   Patient understands monitoring blood glucose, interpreting and using results Needs Review   Patient understands prevention, detection, and treatment of acute complications. Needs Review   Patient understands prevention, detection, and treatment of chronic complications. Needs Review   Patient understands how to develop strategies to address psychosocial issues. Needs Review   Patient understands how to develop strategies to promote health/change behavior. Needs Review   Complications   Last HgB A1C per patient/outside source 7.2 %  11/2015 decreased from 9.1% 08/18/15   How often do you check your blood sugar? 3-4 times / week  supposed to check 1-2 times per day   Fasting Blood glucose range (mg/dL) 70-129   Postprandial Blood glucose range (mg/dL) >200   Number of hypoglycemic episodes per month 3   Can you tell when your blood sugar is low? Yes   What do you do if your blood sugar is low? eat something   Number of hyperglycemic episodes per week 1   Can you tell when your blood sugar is high? No   Have you had a dilated eye exam in the past 12 months? No   Have you had a dental exam in the past 12 months? No   Are you checking your feet? No   Dietary  Intake   Breakfast eggs, bacon, hashbrowns with cheese or in eggs, 8 ounces OJ OR fast food:  steak and cheese bagel, hashbrown, OJ OR oatmeal with milk and 1 T brown sugar and OJ OR skips  9-10   Snack (morning) occasional cashews or pringles or fruit   Lunch sometimes skips OR sandwich (white bread and meat, dry)   Snack (afternoon) chips   Dinner 2 tacos, cheese potatoes OR stuffed clams OR fried chicken, greens, mac and cheese or mashed potatoes (out to eat) OR fried fish, french fries  6-7   Snack (evening) rare   Beverage(s)  OJ, sweet tea, flavored water, water, whole milk   Exercise   Exercise Type Light (walking / raking leaves)  but not so much recently   Patient Education   Previous Diabetes Education Yes (please comment)  only insulin education   Disease state  Definition of diabetes, type 1 and 2, and the diagnosis of diabetes;Explored patient's options for treatment of their diabetes   Nutrition management  Role of diet in the treatment of diabetes and the relationship between the three main macronutrients and blood glucose level;Food label reading, portion sizes and measuring food.;Meal options for control of blood glucose level and chronic complications.;Information on hints to eating out and maintain blood glucose control.;Meal timing in regards to the patients' current diabetes medication.   Physical activity and exercise  Role of exercise on diabetes management, blood pressure control and cardiac health.   Medications Other (comment)  discussed medication and importance of taking them consistently   Monitoring Purpose and frequency of SMBG.;Identified appropriate SMBG and/or A1C goals.;Daily foot exams;Yearly dilated eye exam   Acute complications Taught treatment of hypoglycemia - the 15 rule.   Chronic complications Relationship between chronic complications and blood glucose control;Assessed and discussed foot care and prevention of foot problems;Dental care;Retinopathy and reason for yearly dilated eye exams   Psychosocial adjustment Worked with patient to identify barriers to care and solutions;Role of stress on diabetes;Identified and addressed patients feelings and concerns about diabetes   Personal strategies to promote health Lifestyle issues that need to be addressed for better diabetes care   Individualized Goals (developed by patient)   Nutrition Follow meal plan discussed;General guidelines for healthy choices and portions discussed   Physical Activity Exercise 5-7 days per week;30 minutes per  day   Medications take my medication as prescribed   Monitoring  test my blood glucose as discussed   Reducing Risk do foot checks daily;treat hypoglycemia with 15 grams of carbs if blood glucose less than 70mg /dL   Post-Education Assessment   Patient understands the diabetes disease and treatment process. Demonstrates understanding / competency   Patient understands incorporating nutritional management into lifestyle. Demonstrates understanding / competency   Patient undertands incorporating physical activity into lifestyle. Demonstrates understanding / competency   Patient understands using medications safely. Demonstrates understanding / competency   Patient understands monitoring blood glucose, interpreting and using results Demonstrates understanding / competency   Patient understands prevention, detection, and treatment of acute complications. Demonstrates understanding / competency   Patient understands prevention, detection, and treatment of chronic complications. Demonstrates understanding / competency   Patient understands how to develop strategies to address psychosocial issues. Demonstrates understanding / competency   Patient understands how to develop strategies to promote health/change behavior. Demonstrates understanding / competency   Outcomes   Expected Outcomes Demonstrated interest in learning. Expect positive outcomes   Future DMSE PRN   Program Status Completed  Individualized Plan for Diabetes Self-Management Training:   Learning Objective:  Patient will have a greater understanding of diabetes self-management. Patient education plan is to attend individual and/or group sessions per assessed needs and concerns.   Plan:   Patient Instructions  Plan:  Choose fresh fruit rather than juice or decrease your portion to 1/2 cup occasionally. Aim for 2-3 Carb Choices per meal (30-45 grams) +/- 1 either way  Aim for 0-1 Carbs per snack if hungry  Include protein in  moderation with your meals and snacks Consider reading food labels for Total Carbohydrate and Fat Grams of foods as well as sodium. Avoid processed meats, choose fresh, frozen or canned without salt. Consider  increasing your activity level by walking for 30 minutes daily as tolerated Consider checking BG at alternate times per day as directed by MD  Consider taking medication as directed by MD    Expected Outcomes:  Demonstrated interest in learning. Expect positive outcomes  Education material provided: Living Well with Diabetes, Food label handouts, A1C conversion sheet, Meal plan card, My Plate and Snack sheet  If problems or questions, patient to contact team via:  Phone and Email  Future DSME appointment: PRN

## 2016-01-21 ENCOUNTER — Telehealth: Payer: Self-pay

## 2016-01-21 NOTE — Telephone Encounter (Signed)
Pt states Voltaren Gel is not covered by the insurance. Please call pt back.

## 2016-02-04 ENCOUNTER — Telehealth: Payer: Self-pay | Admitting: Endocrinology

## 2016-02-04 MED ORDER — METFORMIN HCL 500 MG PO TABS: 500.0000 mg | ORAL_TABLET | Freq: Two times a day (BID) | ORAL | Status: DC

## 2016-02-04 NOTE — Telephone Encounter (Signed)
Rx submitted per pt's request.  

## 2016-02-04 NOTE — Telephone Encounter (Signed)
Pt has [facility] for [facility]

## 2016-02-06 ENCOUNTER — Other Ambulatory Visit: Payer: Self-pay | Admitting: Cardiology

## 2016-02-15 ENCOUNTER — Encounter: Payer: BLUE CROSS/BLUE SHIELD | Admitting: Gynecology

## 2016-02-22 ENCOUNTER — Ambulatory Visit (HOSPITAL_COMMUNITY)
Admission: RE | Admit: 2016-02-22 | Discharge: 2016-02-22 | Disposition: A | Discharge status: Home / Self Care | Payer: BLUE CROSS/BLUE SHIELD | Source: Ambulatory Visit | Attending: Cardiology | Admitting: Cardiology

## 2016-02-22 VITALS — BP 130/86 | HR 86 | Wt 194.2 lb

## 2016-02-22 DIAGNOSIS — Z7982 Long term (current) use of aspirin: Secondary | ICD-10-CM | POA: Insufficient documentation

## 2016-02-22 DIAGNOSIS — Z8349 Family history of other endocrine, nutritional and metabolic diseases: Secondary | ICD-10-CM | POA: Diagnosis not present

## 2016-02-22 DIAGNOSIS — I110 Hypertensive heart disease with heart failure: Secondary | ICD-10-CM | POA: Diagnosis not present

## 2016-02-22 DIAGNOSIS — E785 Hyperlipidemia, unspecified: Secondary | ICD-10-CM | POA: Diagnosis not present

## 2016-02-22 DIAGNOSIS — E669 Obesity, unspecified: Secondary | ICD-10-CM | POA: Insufficient documentation

## 2016-02-22 DIAGNOSIS — Z6833 Body mass index (BMI) 33.0-33.9, adult: Secondary | ICD-10-CM | POA: Insufficient documentation

## 2016-02-22 DIAGNOSIS — K219 Gastro-esophageal reflux disease without esophagitis: Secondary | ICD-10-CM | POA: Insufficient documentation

## 2016-02-22 DIAGNOSIS — G4733 Obstructive sleep apnea (adult) (pediatric): Secondary | ICD-10-CM | POA: Insufficient documentation

## 2016-02-22 DIAGNOSIS — I159 Secondary hypertension, unspecified: Secondary | ICD-10-CM

## 2016-02-22 DIAGNOSIS — I5022 Chronic systolic (congestive) heart failure: Secondary | ICD-10-CM | POA: Insufficient documentation

## 2016-02-22 DIAGNOSIS — E042 Nontoxic multinodular goiter: Secondary | ICD-10-CM | POA: Diagnosis not present

## 2016-02-22 DIAGNOSIS — Z79899 Other long term (current) drug therapy: Secondary | ICD-10-CM | POA: Diagnosis not present

## 2016-02-22 DIAGNOSIS — E119 Type 2 diabetes mellitus without complications: Secondary | ICD-10-CM | POA: Insufficient documentation

## 2016-02-22 DIAGNOSIS — Z91013 Allergy to seafood: Secondary | ICD-10-CM | POA: Diagnosis not present

## 2016-02-22 DIAGNOSIS — I428 Other cardiomyopathies: Secondary | ICD-10-CM | POA: Diagnosis not present

## 2016-02-22 DIAGNOSIS — J45909 Unspecified asthma, uncomplicated: Secondary | ICD-10-CM | POA: Diagnosis not present

## 2016-02-22 DIAGNOSIS — Z7984 Long term (current) use of oral hypoglycemic drugs: Secondary | ICD-10-CM | POA: Insufficient documentation

## 2016-02-22 LAB — BASIC METABOLIC PANEL
ANION GAP: 3 — AB (ref 5–15)
BUN: 7 mg/dL (ref 6–20)
CO2: 27 mmol/L (ref 22–32)
Calcium: 8.9 mg/dL (ref 8.9–10.3)
Chloride: 106 mmol/L (ref 101–111)
Creatinine, Ser: 0.6 mg/dL (ref 0.44–1.00)
GFR calc Af Amer: >60 mL/min (ref >60)
GLUCOSE: 156 mg/dL — AB (ref 65–99)
POTASSIUM: 4.3 mmol/L (ref 3.5–5.1)
Sodium: 136 mmol/L (ref 135–145)

## 2016-02-22 MED ORDER — SACUBITRIL-VALSARTAN 49-51 MG PO TABS: 1.0000 | ORAL_TABLET | Freq: Two times a day (BID) | ORAL | 3 refills | Status: DC

## 2016-02-22 NOTE — Patient Instructions (Signed)
INCREASE Entresto to 49/51 mg, one tab twice a day  Labs today and again in 10 days  Your physician recommends that you schedule a follow-up appointment in: 2 months with NP/PA  Do the following things EVERYDAY: 1) Weigh yourself in the morning before breakfast. Write it down and keep it in a log. 2) Take your medicines as prescribed 3) Eat low salt foods-Limit salt (sodium) to 2000 mg per day.  4) Stay as active as you can everyday 5) Limit all fluids for the day to less than 2 liters

## 2016-02-22 NOTE — Progress Notes (Signed)
Advanced Heart Failure Medication Review by a Pharmacist  Does the patient  feel that his/her medications are working for him/her?  yes  Has the patient been experiencing any side effects to the medications prescribed?  no  Does the patient measure his/her own blood pressure or blood glucose at home?  no   Does the patient have any problems obtaining medications due to transportation or finances?   no  Understanding of regimen: good Understanding of indications: good Potential of compliance: good Patient understands to avoid NSAIDs. Patient understands to avoid decongestants.  Issues to address at subsequent visits: None   Pharmacist comments:  [name] is a pleasant 50 yo F presenting without a medication list but with fair recall of her regimen. She reports better compliance with her regimen and did not have any specific medication-related questions or concerns for me at this time.   Ruta Hinds. Velva Harman, PharmD, BCPS, CPP Clinical Pharmacist Pager: [phone] Phone: [phone] 02/22/2016 10:25 AM      Time with patient: 10 minutes Preparation and documentation time: 2 minutes Total time: 12 minutes

## 2016-02-22 NOTE — Progress Notes (Signed)
Patient ID: Susan Peck, female   DOB: 06/23/1966, 50 y.o.   MRN: [id number]  ADVANCED HF CLINIC NOTE  PCP: Dr Rae Mar GYN: Dr Carolin Guernsey Pulmonologist: Dr Gwenette Greet  HPI:  Susan Peck is a 50 year old African American female with systolic heart failure, HTN, GERD, 11/23/11 S/P Hysterectomy, multinodular goiter and asthma.   2004 cardiac cath by Dr Lynnell Jude in [place], [place] with one blockage noted - no stent. EF said to be normal. About 1 year ago at [facility] noted murmur and she was referred to Dr Tamala Julian EF 25-30%. More recently, she has been followed in CHF clinic.   She returns for HF follow up. Last visit HF meds restarted. Denies SOB/PND/Orthopnea. Weight at home 190 pounds. Not walking much.  Not using CPAP. Taking all medications.     04/03/2011  ECHO EF 25-30% 06/26/2012 ECHO EF 30-35%  02/29/2012 ECHO EF 35-40%  11/18/12 ECHO EF 55% Inferior wall mildly hypokinetic with septal HK Myoview 02/03/10 EF 42% No ischemia Septal and apical hypokinesis 11/14 Cardiac MRI  EF 52%- septal bounce suggestive of LBBB. Dyssynchrony from LBBB leads to the low calculated EF. Normal RV size and systolic function. No myocardial delayed enhancement, so no definite evidence for prior myocardial infarction, myocarditis, or infiltrative disease. 1/16 CT chest - small pleural effusions with early pulmonary edema. No ILD. 09/03/2014: ECHO EF 20-25%  6/16: ECHO EF 35-40% Cardiolite (6/16): EF 36%, no ischemia/infarction.  09/07/2015: ECHO EF 30-35%   CPX 11/20/2014  Peak VO2: 16.1 (76.8% predicted peak VO2) - when corrected to ideal BW pVO2 is 22.9 VE/VCO2 slope: 28.5 OUES: 1.59 Peak RER: 1.18  LHC/RHC 09/15/2015  RA = 2 RV = 22/1/2 PA = 27/8 (19) PCW = 8 Fick cardiac output/index = 5.22/2.8 PVR = 2.1 Ao sat = 98% PA sat = 70%, 71% Assessment: 1. Normal coronary arteries 2. Normal hemodynamics  3. LVEF 35-40% with global HK due to   Labs 08/19/14: K 4.1 Creatinine 0.62   Labs 10/28/2014: K 4.3  Creatinine 0.73  Labs 11/19/2014: K 3.6 Creatinine 0.64  Labs 6/16: digoxin 0.5 Labs 7/16: HIV negative, TSH normal Lipids 8/16: TC 206, TG 133, HDL 29, LDL 150 Labs 08/18/2015: 9.1  Labs 09/07/2015: K 4.4 Creating 0.68  Lab 12/14/2015: K 3.7 Creatinine 0.71   ROS: All other systems normal except as mentioned in HPI, past medical history and problem list.    Past Medical History:  Diagnosis Date  . Anxiety    no meds  . Asthma   . Depression    no meds  . Diabetes mellitus    recent dx 11/17/11 - started med 11/18/11  . GERD (gastroesophageal reflux disease)   . Goiter 07/27/2011   non-neoplastic goiter - fine needle aspiration - benign  . Heart murmur    dx 2 yrs ago per pt  . Herpes genitalis in women   . Hypertension   . IBS (irritable bowel syndrome)    tx with diet per pt  . Low back pain    history  . Obesity   . Seasonal allergies   . Shortness of breath    occasional - exercise induced  . Systolic heart failure    May 2011 EF 35-40%, 04/03/11 EF 25-30%, 06/27/11 EF 30-35%    Current Outpatient Prescriptions  Medication Sig Dispense Refill  . albuterol (PROVENTIL HFA;VENTOLIN HFA) 108 (90 BASE) MCG/ACT inhaler Inhale 1-2 puffs into the lungs every 6 (six) hours as needed for wheezing or shortness  of breath. 1 Inhaler 0  . aspirin 81 MG chewable tablet Chew 81 mg by mouth every morning.     Marland Kitchen atorvastatin (LIPITOR) 40 MG tablet Take 1 tablet (40 mg total) by mouth daily. 30 tablet 6  . BAYER CONTOUR TEST test strip   1  . carvedilol (COREG) 25 MG tablet TAKE 1 TABLET BY MOUTH TWICE DAILY WITH MEALS 60 tablet 1  . DULoxetine (CYMBALTA) 30 MG capsule Take 1 capsule (30 mg total) by mouth daily. 30 capsule 3  . estradiol (ESTRACE) 1 MG tablet Take 1 tablet (1 mg total) by mouth daily. 30 tablet 11  . fluticasone (FLONASE) 50 MCG/ACT nasal spray Place 1 spray into both nostrils 2 (two) times daily as needed for allergies or rhinitis.    . furosemide (LASIX) 40 MG tablet  Take 40 mg by mouth 2 (two) times daily.    Marland Kitchen glimepiride (AMARYL) 2 MG tablet Take 2 mg by mouth daily with breakfast.    . isosorbide-hydrALAZINE (BIDIL) 20-37.5 MG tablet Take 0.5 tablets by mouth 3 (three) times daily. 45 tablet 6  . levothyroxine (SYNTHROID, LEVOTHROID) 50 MCG tablet Take 1 tablet (50 mcg total) by mouth daily. 30 tablet 0  . metFORMIN (GLUCOPHAGE) 500 MG tablet Take 1 tablet (500 mg total) by mouth 2 (two) times daily with a meal. 60 tablet 2  . Polyethyl Glycol-Propyl Glycol (SYSTANE) 0.4-0.3 % SOLN Apply 1 drop to eye 2 (two) times daily as needed (dry eyes).    . sacubitril-valsartan (ENTRESTO) 24-26 MG Take 1 tablet by mouth 2 (two) times daily.      No current facility-administered medications for this encounter.    Facility-Administered Medications Ordered in Other Encounters  Medication Dose Route Frequency Provider Last Rate Last Dose  . aminophylline injection 150 mg  150 mg Intravenous BID PRN Larey Dresser, MD   150 mg at 12/24/14 1005     Allergies  Allergen Reactions  . Shrimp [Shellfish Allergy] Anaphylaxis    Social History   Social History  . Marital status: Legally Separated    Spouse name: N/A  . Number of children: Y  . Years of education: N/A   Occupational History  . 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 Smoking status: Never Smoker  . Smokeless tobacco: Never Used  . Alcohol use No  . Drug use: No  . Sexual activity: Not on file   Other Topics Concern  . Not on file   Social History Narrative   She lives with her husband and son.  She is works for [employer].    Family History  Problem Relation Age of Onset  . Heart disease Maternal Aunt   . Breast cancer Mother   . Thyroid disease Mother   . Hypertension Maternal Grandmother   . Diabetes Maternal Grandmother   . Breast cancer Maternal Aunt   . Heart disease Maternal Aunt   . Diabetes Maternal Grandfather   . Diabetes Paternal  Grandmother   . Diabetes Paternal Grandfather   . Hypertension Paternal Grandfather     PHYSICAL EXAM: Vitals:   02/22/16 1018  BP: 130/86  Pulse: 86   BP 130/86   Pulse 86   Wt 194 lb 3.2 oz (88.1 kg)   SpO2 96%   BMI 33.33 kg/m   General:  Well appearing. No respiratory difficulty. Ambulated in the clinic without difficulty.  HEENT:  Normal Neck: supple. JVP 6-7  Carotids 2+ bilat; no bruits. No lymphadenopathy. +  nodular goiter Cor: PMI nondisplaced. Regular rate & rhythm. No rubs or murmurs.  Lungs: clear Abdomen: soft, nontender, obese, nondistended. No hepatosplenomegaly. No bruits or masses. Good bowel sounds. Extremities: no cyanosis, clubbing, rash., edema.  Neuro: alert & oriented x 3, cranial nerves grossly intact. moves all 4 extremities w/o difficulty. Affect pleasant.  ASSESSMENT & PLAN: 1. Chronic systolic HF: Nonischemic cardiomyopathy.  08/2015 ECHO per Dr Haroldine Laws EF 35-40%, outside ICD range (similar to cMRI).  NYHA class II-III symptoms. Volume status ok. Cut back lasix to 40 mg daily.     Not on spiro due to painful gynecomastia. Can consider inspra  If needed.   - On goal dose of carvedilol 25 bid.  -Can stay off dig.    - Increase entresto 49-51 mg twice a day.  BMET today and in 10 days.  - 2. Chest pressure- resolved. LHC 08/2015 with normal cors.   3. HTN: Stable. .  4. OSA: Continue CPAP. Needs to use nightly 5. Multinodular goiter: TSH normal in 7/16.    6. Hyperlipidemia: Needs exercise and weight loss. Continue atorva 40 daily (recently increased)  7. Palpitations: resolved.  8. DM II: followed by  Endocrinologist. She is being followed at [facility] for assistance with diabetes management.   BMET today and in 10 days.   Follow up in 8 weeks.  Aarika Moon NP-C  02/22/2016

## 2016-03-03 ENCOUNTER — Inpatient Hospital Stay (HOSPITAL_COMMUNITY): Admission: RE | Admit: 2016-03-03 | Payer: BLUE CROSS/BLUE SHIELD | Source: Ambulatory Visit

## 2016-03-08 ENCOUNTER — Encounter: Payer: Self-pay | Admitting: Gynecology

## 2016-03-08 ENCOUNTER — Ambulatory Visit (INDEPENDENT_AMBULATORY_CARE_PROVIDER_SITE_OTHER): Payer: BLUE CROSS/BLUE SHIELD | Admitting: Gynecology

## 2016-03-08 VITALS — BP 114/70 | Ht 63.75 in | Wt 192.0 lb

## 2016-03-08 DIAGNOSIS — Z01419 Encounter for gynecological examination (general) (routine) without abnormal findings: Secondary | ICD-10-CM

## 2016-03-08 DIAGNOSIS — Z780 Asymptomatic menopausal state: Secondary | ICD-10-CM | POA: Diagnosis not present

## 2016-03-08 DIAGNOSIS — Z79890 Hormone replacement therapy (postmenopausal): Secondary | ICD-10-CM

## 2016-03-08 DIAGNOSIS — Z86010 Personal history of colonic polyps: Secondary | ICD-10-CM

## 2016-03-08 NOTE — Progress Notes (Signed)
Susan Peck 08-27-1965 [id number]   History:    50 y.o.  for annual gyn exam. Patient had been tested a few years ago was found to have elevated FSH and she was suffering from vasomotor symptoms and has been doing well with Estrace 1 mg by mouth daily. Patient back in 2013 had a laparoscopic-assisted vaginal hysterectomy.Patient had a colonoscopy in 2008 and stated that benign polyps were removed and the had informed her that her next colonoscopy would be 10 years after that date. Patient denies any high-risk abnormal Pap smear before her hysterectomy or after. Her PCP is Dr. Ayesha Rumpf who has been doing her blood work.   Patient is also being followed by Dr. Dwyane Dee for type 2 diabetes and her multinodular thyroid for which she's currently on medication.  Past medical history,surgical history, family history and social history were all reviewed and documented in the EPIC chart.  Gynecologic History No LMP recorded. Patient has had a hysterectomy. Contraception: status post hysterectomy Last Pap: 2016. Results were: normal Last mammogram: 2017. Results were: normal  Obstetric History OB History  Gravida Para Term Preterm AB Living  7 3     3 3   SAB TAB Ectopic Multiple Live Births  1            # Outcome Date GA Lbr Len/2nd Weight Sex Delivery Anes PTL Lv  7 SAB           6 AB           5 AB           4 Para           3 Para           2 Para           1 Gravida                ROS: A ROS was performed and pertinent positives and negatives are included in the history.  GENERAL: No fevers or chills. HEENT: No change in vision, no earache, sore throat or sinus congestion. NECK: No pain or stiffness. CARDIOVASCULAR: No chest pain or pressure. No palpitations. PULMONARY: No shortness of breath, cough or wheeze. GASTROINTESTINAL: No abdominal pain, nausea, vomiting or diarrhea, melena or bright red blood per rectum. GENITOURINARY: No urinary frequency, urgency, hesitancy or dysuria.  MUSCULOSKELETAL: No joint or muscle pain, no back pain, no recent trauma. DERMATOLOGIC: No rash, no itching, no lesions. ENDOCRINE: No polyuria, polydipsia, no heat or cold intolerance. No recent change in weight. HEMATOLOGICAL: No anemia or easy bruising or bleeding. NEUROLOGIC: No headache, seizures, numbness, tingling or weakness. PSYCHIATRIC: No depression, no loss of interest in normal activity or change in sleep pattern.     Exam: chaperone present  BP 114/70   Ht 5' 3.75" (1.619 m)   Wt 192 lb (87.1 kg)   BMI 33.22 kg/m   Body mass index is 33.22 kg/m.  General appearance : Well developed well nourished female. No acute distress HEENT: Eyes: no retinal hemorrhage or exudates,  Neck supple, trachea midline, no carotid bruits, no thyroidmegaly Lungs: Clear to auscultation, no rhonchi or wheezes, or rib retractions  Heart: Regular rate and rhythm, no murmurs or gallops Breast:Examined in sitting and supine position were symmetrical in appearance, no palpable masses or tenderness,  no skin retraction, no nipple inversion, no nipple discharge, no skin discoloration, no axillary or supraclavicular lymphadenopathy Abdomen: no palpable masses or tenderness, no rebound or guarding Extremities:  no edema or skin discoloration or tenderness  Pelvic:  Bartholin, Urethra, Skene Glands: Within normal limits             Vagina: No gross lesions or discharge  Cervix: Absent  Uterus  absent  Adnexa  Without masses or tenderness  Anus and perineum  normal   Rectovaginal  normal sphincter tone without palpated masses or tenderness             Hemoccult patient had colonoscopy less than 12 months ago. Patient with past history of benign colon polyps     Assessment/Plan:  50 y.o. female for annual exam who is getting her blood work from her medical doctor. Pap smear not indicated this year. Patient is reminded do her monthly supple examination. Patient doing well on Estrace 1 mg daily for  vasomotor symptoms did not need a refill today. We discussed importance of calcium vitamin D and weightbearing exercises for osteoporosis prevention we also discussed importance of monthly breast exam. Patient to schedule bone density study in the next few weeks.   Terrance Mass MD, 2:35 PM 03/08/2016

## 2016-03-08 NOTE — Patient Instructions (Signed)

## 2016-03-17 ENCOUNTER — Telehealth: Payer: Self-pay | Admitting: Endocrinology

## 2016-03-17 MED ORDER — LEVOTHYROXINE SODIUM 50 MCG PO TABS: 50.0000 ug | ORAL_TABLET | Freq: Every day | ORAL | 4 refills | Status: DC

## 2016-03-17 NOTE — Telephone Encounter (Signed)
Patient need refill of medication levothyroxine (SYNTHROID, LEVOTHROID) 50 MCG tablet  [id number] [address], [place] [address] [phone] (Phone) [phone] (Fax)

## 2016-03-17 NOTE — Telephone Encounter (Signed)
Prescription submitted per patient's request.  

## 2016-03-21 ENCOUNTER — Other Ambulatory Visit: Payer: BLUE CROSS/BLUE SHIELD

## 2016-03-28 ENCOUNTER — Ambulatory Visit: Payer: BLUE CROSS/BLUE SHIELD | Admitting: Endocrinology

## 2016-04-10 ENCOUNTER — Encounter: Payer: Self-pay | Admitting: Internal Medicine

## 2016-04-10 ENCOUNTER — Ambulatory Visit (INDEPENDENT_AMBULATORY_CARE_PROVIDER_SITE_OTHER): Payer: BLUE CROSS/BLUE SHIELD | Admitting: Internal Medicine

## 2016-04-10 VITALS — BP 150/83 | HR 77 | Temp 98.1°F | Ht 63.0 in | Wt 193.5 lb

## 2016-04-10 DIAGNOSIS — Z7984 Long term (current) use of oral hypoglycemic drugs: Secondary | ICD-10-CM

## 2016-04-10 DIAGNOSIS — E119 Type 2 diabetes mellitus without complications: Secondary | ICD-10-CM | POA: Diagnosis not present

## 2016-04-10 DIAGNOSIS — I110 Hypertensive heart disease with heart failure: Secondary | ICD-10-CM

## 2016-04-10 DIAGNOSIS — I5022 Chronic systolic (congestive) heart failure: Secondary | ICD-10-CM

## 2016-04-10 DIAGNOSIS — IMO0002 Reserved for concepts with insufficient information to code with codable children: Secondary | ICD-10-CM

## 2016-04-10 DIAGNOSIS — Z9114 Patient's other noncompliance with medication regimen: Secondary | ICD-10-CM

## 2016-04-10 DIAGNOSIS — Z79899 Other long term (current) drug therapy: Secondary | ICD-10-CM

## 2016-04-10 DIAGNOSIS — I10 Essential (primary) hypertension: Secondary | ICD-10-CM

## 2016-04-10 DIAGNOSIS — E1169 Type 2 diabetes mellitus with other specified complication: Principal | ICD-10-CM

## 2016-04-10 DIAGNOSIS — E1165 Type 2 diabetes mellitus with hyperglycemia: Secondary | ICD-10-CM

## 2016-04-10 LAB — POCT GLYCOSYLATED HEMOGLOBIN (HGB A1C): HEMOGLOBIN A1C: 7.3

## 2016-04-10 LAB — GLUCOSE, CAPILLARY: Glucose-Capillary: 198 mg/dL — ABNORMAL HIGH (ref 65–99)

## 2016-04-10 MED ORDER — METFORMIN HCL 500 MG PO TABS: 1000.0000 mg | ORAL_TABLET | Freq: Two times a day (BID) | ORAL | 2 refills | Status: DC

## 2016-04-10 NOTE — Progress Notes (Signed)
   CC: follow up Diabetes, HTN  HPI:  [name] is a 50 y.o. with systolic heart failure, DM II, and HTN among other problems as listed below.   Past Medical History:  Diagnosis Date  . Anxiety    no meds  . Asthma   . Depression    no meds  . Diabetes mellitus    recent dx 11/17/11 - started med 11/18/11  . GERD (gastroesophageal reflux disease)   . Goiter 07/27/2011   non-neoplastic goiter - fine needle aspiration - benign  . Heart murmur    dx 2 yrs ago per pt  . Herpes genitalis in women   . Hypertension   . IBS (irritable bowel syndrome)    tx with diet per pt  . Low back pain    history  . Obesity   . Seasonal allergies   . Shortness of breath    occasional - exercise induced  . Systolic heart failure    May 2011 EF 35-40%, 04/03/11 EF 25-30%, 06/27/11 EF 30-35%    Diabetes: hgba1c 7.3 today. Was 7.2 last time. On glimepiride 2mg  daily + metformin 500mg  bid. Checks sugar at home once a daily. Usually in mid 100's.   HTN/CHF - on coreg 25mg  bid, lasix 40mg  bid, Entresto 49-51mg . Stopped taking Bidil b/c of headache. Has not been taking BP meds every day, took it last on Saturday. Has appt with CHF clinic on 04/25/16.    Review of Systems:    Review of Systems  Constitutional: Negative for chills, fever and malaise/fatigue.  Eyes: Negative for photophobia.  Respiratory: Negative for cough and sputum production.   Cardiovascular: Negative for chest pain and palpitations.  Gastrointestinal: Negative for heartburn, nausea and vomiting.  Genitourinary: Negative for dysuria.  Skin: Negative for rash.  Neurological: Negative for dizziness and headaches.     Physical Exam:  Vitals:   04/10/16 1625  BP: (!) 150/83  Pulse: 77  Temp: 98.1 F (36.7 C)  TempSrc: Oral  SpO2: 100%  Weight: 193 lb 8 oz (87.8 kg)  Height: 5\' 3"  (1.6 m)   Physical Exam  Constitutional: She is oriented to person, place, and time. She appears well-developed and well-nourished.  No distress.  HENT:  Head: Normocephalic and atraumatic.  Eyes: Conjunctivae are normal.  Neck: No JVD present.  Cardiovascular: Exam reveals no gallop and no friction rub.   Systolic murmur  Respiratory: Effort normal and breath sounds normal. No respiratory distress. She has no wheezes.  Musculoskeletal:  Trace edema b/l lower exts.   Neurological: She is alert and oriented to person, place, and time.     Assessment & Plan:   See Encounters Tab for problem based charting.  Patient discussed with Dr. Dareen Piano

## 2016-04-10 NOTE — Patient Instructions (Signed)
Start taking metformin 1000mg  twice a day.  You must take your blood pressure/heart failure medications every day.  Follow up with CHF clinic.  Keep checking your weight daily. Avoid salt.

## 2016-04-10 NOTE — Assessment & Plan Note (Addendum)
Vitals:   04/10/16 1625  BP: (!) 150/83  Pulse: 77  Temp: 98.1 F (36.7 C)   Has not been taking her meds since Saturday. Takes few meds for HTN and CHF including coreg 25mg  bid + lasix 40mg  bid + entresto. Has stopped taking BIDIL for headache.   Asked her to take the rest of her meds other than BIDIL every day. Will not change meds as she is noncomplaint with her current regimen. She will discuss BIDIL when she follows up with CHF clinic on October 3rd.

## 2016-04-11 NOTE — Assessment & Plan Note (Signed)
hgba1c 7.3 today. Was 7.2 last time. On glimepiride 2mg  daily + metformin 500mg  bid. Checks sugar at home once a daily. Usually in mid 100's. No hypoglycemias.  Will continue glimepiride 2mg  daily Increase metformin to 1000mg  bid. F/up in 3 months. She can see the endocrinologist if she desires but we may be able to manage her diabetes without specialist assistant as this time.

## 2016-04-13 ENCOUNTER — Ambulatory Visit (INDEPENDENT_AMBULATORY_CARE_PROVIDER_SITE_OTHER): Payer: BLUE CROSS/BLUE SHIELD

## 2016-04-13 ENCOUNTER — Other Ambulatory Visit: Payer: Self-pay | Admitting: Gynecology

## 2016-04-13 DIAGNOSIS — Z13820 Encounter for screening for osteoporosis: Secondary | ICD-10-CM | POA: Diagnosis not present

## 2016-04-13 DIAGNOSIS — Z780 Asymptomatic menopausal state: Secondary | ICD-10-CM

## 2016-04-13 NOTE — Progress Notes (Signed)
Internal Medicine Clinic Attending  Case discussed with Dr. Ahmed at the time of the visit.  We reviewed the resident's history and exam and pertinent patient test results.  I agree with the assessment, diagnosis, and plan of care documented in the resident's note. 

## 2016-04-21 ENCOUNTER — Other Ambulatory Visit: Payer: Self-pay | Admitting: *Deleted

## 2016-04-21 MED ORDER — DULOXETINE HCL 30 MG PO CPEP: 30.0000 mg | ORAL_CAPSULE | Freq: Every day | ORAL | 0 refills | Status: DC

## 2016-04-25 ENCOUNTER — Encounter (HOSPITAL_COMMUNITY): Payer: BLUE CROSS/BLUE SHIELD

## 2016-04-26 ENCOUNTER — Other Ambulatory Visit: Payer: Self-pay

## 2016-04-26 MED ORDER — DULOXETINE HCL 30 MG PO CPEP: 30.0000 mg | ORAL_CAPSULE | Freq: Every day | ORAL | 2 refills | Status: DC

## 2016-04-29 ENCOUNTER — Other Ambulatory Visit (HOSPITAL_COMMUNITY): Payer: Self-pay | Admitting: Adult Health

## 2016-04-29 DIAGNOSIS — I5022 Chronic systolic (congestive) heart failure: Secondary | ICD-10-CM

## 2016-05-20 ENCOUNTER — Other Ambulatory Visit (HOSPITAL_COMMUNITY): Payer: Self-pay | Admitting: Adult Health

## 2016-05-20 DIAGNOSIS — I5022 Chronic systolic (congestive) heart failure: Secondary | ICD-10-CM

## 2016-06-26 ENCOUNTER — Encounter (HOSPITAL_COMMUNITY): Payer: Self-pay | Admitting: Family Medicine

## 2016-06-26 ENCOUNTER — Telehealth: Payer: Self-pay

## 2016-06-26 ENCOUNTER — Ambulatory Visit (HOSPITAL_COMMUNITY)
Admission: EM | Admit: 2016-06-26 | Discharge: 2016-06-26 | Disposition: A | Discharge status: Home / Self Care | Payer: BLUE CROSS/BLUE SHIELD | Attending: Family Medicine | Admitting: Family Medicine

## 2016-06-26 DIAGNOSIS — J40 Bronchitis, not specified as acute or chronic: Secondary | ICD-10-CM

## 2016-06-26 DIAGNOSIS — H6502 Acute serous otitis media, left ear: Secondary | ICD-10-CM | POA: Diagnosis not present

## 2016-06-26 MED ORDER — AMOXICILLIN 500 MG PO CAPS: 500.0000 mg | ORAL_CAPSULE | Freq: Three times a day (TID) | ORAL | 0 refills | Status: DC

## 2016-06-26 MED ORDER — HYDROCOD POLST-CPM POLST ER 10-8 MG/5ML PO SUER: 5.0000 mL | Freq: Two times a day (BID) | ORAL | 0 refills | Status: DC | PRN

## 2016-06-26 NOTE — ED Triage Notes (Signed)
Triaged by Provider. 

## 2016-06-26 NOTE — Telephone Encounter (Signed)
Requesting the nurse to call back regarding meds.

## 2016-06-26 NOTE — ED Provider Notes (Addendum)
[place]    CSN: [id number] Arrival date & time: 06/26/16  [id number]     History   Chief Complaint No chief complaint on file.   HPI Susan Peck is a 50 y.o. female.   This a 50 year old woman coming in with a cough and cold symptoms. The symptoms of been ongoing for 2 weeks. She also complains about ear pain. She's exhausted.  Patient does not work at this time. Instead she cares for her grandchildren.      Past Medical History:  Diagnosis Date  . Anxiety    no meds  . Asthma   . Depression    no meds  . Diabetes mellitus    recent dx 11/17/11 - started med 11/18/11  . GERD (gastroesophageal reflux disease)   . Goiter 07/27/2011   non-neoplastic goiter - fine needle aspiration - benign  . Heart murmur    dx 2 yrs ago per pt  . Herpes genitalis in women   . Hypertension   . IBS (irritable bowel syndrome)    tx with diet per pt  . Low back pain    history  . Obesity   . Seasonal allergies   . Shortness of breath    occasional - exercise induced  . Systolic heart failure    May 2011 EF 35-40%, 04/03/11 EF 25-30%, 06/27/11 EF 30-35%    Patient Active Problem List   Diagnosis Date Noted  . History of colonic polyps 03/08/2016  . Intercostal muscle pain 12/28/2015  . Chronic hip pain 12/28/2015  . Diabetes mellitus type II, uncontrolled ([place]) 10/05/2015  . HTN (hypertension) 09/17/2014  . Perimenopause 04/15/2013  . Goiter 07/10/2011  . Obstructive sleep apnea 05/29/2011  . Chronic systolic heart failure ([place]) 04/10/2011    Past Surgical History:  Procedure Laterality Date  . ABDOMINAL HYSTERECTOMY    . cardiac catherization  2004   [place], [place] - Dr Lynnell Jude  . CARDIAC CATHETERIZATION    . CARDIAC CATHETERIZATION N/A 09/15/2015   Procedure: Right/Left Heart Cath and Coronary Angiography;  Surgeon: Jolaine Artist, MD;  Location: [facility];  Service: Cardiovascular;  Laterality: N/A;  . COLONOSCOPY WITH PROPOFOL N/A 03/26/2015   Procedure: COLONOSCOPY WITH PROPOFOL;  Surgeon: Carol Ada, MD;  Location: [facility];  Service: Endoscopy;  Laterality: N/A;  . colonscopy    . CYSTOSCOPY  11/23/2011   Procedure: CYSTOSCOPY;  Surgeon: Jolayne Haines, MD;  Location: [facility];  Service: Gynecology;  Laterality: N/A;  . DIAGNOSTIC LAPAROSCOPY     of pelvis  . DILATION AND CURETTAGE OF UTERUS  12/2006,  10/2005   hysteroscopy surgery x 2  . LAPAROSCOPIC ASSISTED VAGINAL HYSTERECTOMY  11/23/2011   Procedure: LAPAROSCOPIC ASSISTED VAGINAL HYSTERECTOMY;  Surgeon: Jolayne Haines, MD;  Location: [facility];  Service: Gynecology;  Laterality: N/A;  . NOVASURE ABLATION     10/2005  . SALPINGOOPHORECTOMY  11/23/2011   Procedure: SALPINGO OOPHERECTOMY;  Surgeon: Jolayne Haines, MD;  Location: [facility];  Service: Gynecology;  Laterality: Bilateral;  . SVD     x 3  . TUBAL LIGATION    . WISDOM TOOTH EXTRACTION      OB History    Gravida Para Term Preterm AB Living   7 3     3 3    SAB TAB Ectopic Multiple Live Births   1               Home Medications    Prior to  Admission medications   Medication Sig Start Date End Date Taking? Authorizing Provider  albuterol (PROVENTIL HFA;VENTOLIN HFA) 108 (90 BASE) MCG/ACT inhaler Inhale 1-2 puffs into the lungs every 6 (six) hours as needed for wheezing or shortness of breath. 05/17/14   Kaitlyn Szekalski, PA-C  amoxicillin (AMOXIL) 500 MG capsule Take 1 capsule (500 mg total) by mouth 3 (three) times daily. 06/26/16   Robyn Haber, MD  aspirin 81 MG chewable tablet Chew 81 mg by mouth every morning.     Historical Provider, MD  atorvastatin (LIPITOR) 40 MG tablet Take 1 tablet (40 mg total) by mouth daily. 06/01/15   Jolaine Artist, MD  BAYER CONTOUR TEST test strip  11/03/15   Historical Provider, MD  carvedilol (COREG) 25 MG tablet TAKE 1 TABLET BY MOUTH TWICE DAILY WITH MEALS 02/08/16   Larey Dresser, MD  chlorpheniramine-HYDROcodone [facility]) 10-8 MG/5ML SUER Take 5 mLs by  mouth every 12 (twelve) hours as needed for cough. 06/26/16   Robyn Haber, MD  DULoxetine (CYMBALTA) 30 MG capsule Take 1 capsule (30 mg total) by mouth daily. 04/26/16   Elayne Snare, MD  estradiol (ESTRACE) 1 MG tablet Take 1 tablet (1 mg total) by mouth daily. 08/12/15   Terrance Mass, MD  fluticasone (FLONASE) 50 MCG/ACT nasal spray Place 1 spray into both nostrils 2 (two) times daily as needed for allergies or rhinitis.    Historical Provider, MD  furosemide (LASIX) 40 MG tablet Take 40 mg by mouth 2 (two) times daily.    Historical Provider, MD  furosemide (LASIX) 40 MG tablet Take 1 tablet (40 mg total) by mouth 2 (two) times daily. 05/01/16   Amy D Ninfa Meeker, NP  furosemide (LASIX) 40 MG tablet Take 1 tablet (40 mg total) by mouth 2 (two) times daily. 05/22/16   Amy D Clegg, NP  glimepiride (AMARYL) 2 MG tablet Take 2 mg by mouth daily with breakfast.    Historical Provider, MD  isosorbide-hydrALAZINE (BIDIL) 20-37.5 MG tablet Take 0.5 tablets by mouth 3 (three) times daily. 08/26/15   Jolaine Artist, MD  levothyroxine (SYNTHROID, LEVOTHROID) 50 MCG tablet Take 1 tablet (50 mcg total) by mouth daily. 03/17/16   Elayne Snare, MD  metFORMIN (GLUCOPHAGE) 500 MG tablet Take 2 tablets (1,000 mg total) by mouth 2 (two) times daily with a meal. 04/10/16   Tasrif Ahmed, MD  Polyethyl Glycol-Propyl Glycol (SYSTANE) 0.4-0.3 % SOLN Apply 1 drop to eye 2 (two) times daily as needed (dry eyes).    Historical Provider, MD  sacubitril-valsartan (ENTRESTO) 49-51 MG Take 1 tablet by mouth 2 (two) times daily. 02/22/16   Larey Dresser, MD    Family History Family History  Problem Relation Age of Onset  . Heart disease Maternal Aunt   . Breast cancer Mother   . Thyroid disease Mother   . Hypertension Maternal Grandmother   . Diabetes Maternal Grandmother   . Breast cancer Maternal Aunt   . Heart disease Maternal Aunt   . Diabetes Maternal Grandfather   . Diabetes Paternal Grandmother   . Diabetes Paternal  Grandfather   . Hypertension Paternal Grandfather     Social History Social History  Substance Use Topics  . Smoking status: Never Smoker  . Smokeless tobacco: Never Used  . Alcohol use No     Allergies   Shrimp [shellfish allergy]   Review of Systems Review of Systems  Constitutional: Positive for chills and fatigue. Negative for fever.  HENT: Positive  for congestion, ear pain and postnasal drip.   Respiratory: Positive for cough and chest tightness.   Gastrointestinal: Negative.   Genitourinary: Negative.   Musculoskeletal: Positive for myalgias.  Neurological: Negative.      Physical Exam Triage Vital Signs ED Triage Vitals  Enc Vitals Group     BP      Pulse      Resp      Temp      Temp src      SpO2      Weight      Height      Head Circumference      Peak Flow      Pain Score      Pain Loc      Pain Edu?      Excl. in [place]?    No data found.   Updated Vital Signs BP 155/92 (BP Location: Left Arm)   Pulse 104   Temp 100 F (37.8 C) (Oral)   Resp 16   SpO2 100%       Physical Exam  Constitutional: She is oriented to person, place, and time. She appears well-developed and well-nourished.  HENT:  Head: Normocephalic.  Right Ear: External ear normal.  Left Ear: External ear normal.  Mouth/Throat: Oropharynx is clear and moist.  The left ear is bulging and red, the right ear is opaque  Eyes: Conjunctivae and EOM are normal.  Neck: Normal range of motion. Neck supple.  Cardiovascular: Normal heart sounds.   Pulmonary/Chest: Effort normal. She has wheezes.  Musculoskeletal: Normal range of motion.  Neurological: She is alert and oriented to person, place, and time.  Skin: Skin is warm and dry.  Nursing note and vitals reviewed.    UC Treatments / Results  Labs (all labs ordered are listed, but only abnormal results are displayed) Labs Reviewed - No data to display  EKG  EKG Interpretation None       Radiology No results  found.  Procedures Procedures (including critical care time)  Medications Ordered in UC Medications - No data to display   Initial Impression / Assessment and Plan / UC Course  I have reviewed the triage vital signs and the nursing notes.  Pertinent labs & imaging results that were available during my care of the patient were reviewed by me and considered in my medical decision making (see chart for details).  Clinical Course     Final Clinical Impressions(s) / UC Diagnoses   Final diagnoses:  Acute serous otitis media of left ear, recurrence not specified  Bronchitis    New Prescriptions New Prescriptions   AMOXICILLIN (AMOXIL) 500 MG CAPSULE    Take 1 capsule (500 mg total) by mouth 3 (three) times daily.   CHLORPHENIRAMINE-HYDROCODONE (TUSSIONEX PENNKINETIC ER) 10-8 MG/5ML SUER    Take 5 mLs by mouth every 12 (twelve) hours as needed for cough.     Robyn Haber, MD 06/26/16 2009    Robyn Haber, MD 06/26/16 2011

## 2016-06-28 NOTE — Telephone Encounter (Signed)
appt at pt choosing for fri 12/8 at [facility]

## 2016-06-28 NOTE — Telephone Encounter (Signed)
Lm for rtc 

## 2016-06-30 ENCOUNTER — Ambulatory Visit (INDEPENDENT_AMBULATORY_CARE_PROVIDER_SITE_OTHER): Payer: BLUE CROSS/BLUE SHIELD | Admitting: Internal Medicine

## 2016-06-30 VITALS — BP 151/94 | HR 96 | Temp 98.6°F | Wt 190.9 lb

## 2016-06-30 DIAGNOSIS — B9689 Other specified bacterial agents as the cause of diseases classified elsewhere: Secondary | ICD-10-CM

## 2016-06-30 DIAGNOSIS — IMO0002 Reserved for concepts with insufficient information to code with codable children: Secondary | ICD-10-CM

## 2016-06-30 DIAGNOSIS — Z79899 Other long term (current) drug therapy: Secondary | ICD-10-CM

## 2016-06-30 DIAGNOSIS — J019 Acute sinusitis, unspecified: Secondary | ICD-10-CM | POA: Diagnosis not present

## 2016-06-30 DIAGNOSIS — Z7984 Long term (current) use of oral hypoglycemic drugs: Secondary | ICD-10-CM

## 2016-06-30 DIAGNOSIS — E1169 Type 2 diabetes mellitus with other specified complication: Secondary | ICD-10-CM

## 2016-06-30 DIAGNOSIS — I110 Hypertensive heart disease with heart failure: Secondary | ICD-10-CM | POA: Diagnosis not present

## 2016-06-30 DIAGNOSIS — E118 Type 2 diabetes mellitus with unspecified complications: Secondary | ICD-10-CM

## 2016-06-30 DIAGNOSIS — I5022 Chronic systolic (congestive) heart failure: Secondary | ICD-10-CM

## 2016-06-30 DIAGNOSIS — I10 Essential (primary) hypertension: Secondary | ICD-10-CM

## 2016-06-30 DIAGNOSIS — E1165 Type 2 diabetes mellitus with hyperglycemia: Secondary | ICD-10-CM

## 2016-06-30 MED ORDER — DOXYCYCLINE HYCLATE 100 MG PO CAPS: 100.0000 mg | ORAL_CAPSULE | Freq: Two times a day (BID) | ORAL | 0 refills | Status: DC

## 2016-06-30 NOTE — Assessment & Plan Note (Signed)
Hypertensive today at 151/94. Chart review shows similar elevation at her last visit in September however she had not taken her medications at that time. Suspect current elevation due to current illness, however this should be re-evaluated and readdressed at f/u visit.  -Continue current regimen of Coreg 25 mg, Bidil and Entresto.

## 2016-06-30 NOTE — Progress Notes (Signed)
   CC: not feeling well for 3 weeks.  HPI:  [name] is a 50 y.o. F with Mhx significant for Dm2 and CHF who presents for evaluation of a 3 week history of "feeling sick." Patient reports that during the first week she had symptoms of cough, runny nose, sore throat and myalgias which began to improve on their own after a few days. Shortly after beginning to feel better she reports she "felt like she got hit with a truck." She reports worsening cough, nasal congestion, ear ache, sinus pain and pressure. She was seen in the ED 12/4 and was diagnosed with acute otitis media and bronchitis and was d/c home with Rx of Amoxicillin and a cough suppressant. She reports she has only taken 3 doses of this medication as she feels itchy after taking it. Denied any rash, SOB, dysphagia or swelling.  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 Did not get the flu shot.   Past Medical History:  Diagnosis Date  . Anxiety    no meds  . Asthma   . Depression    no meds  . Diabetes mellitus    recent dx 11/17/11 - started med 11/18/11  . GERD (gastroesophageal reflux disease)   . Goiter 07/27/2011   non-neoplastic goiter - fine needle aspiration - benign  . Heart murmur    dx 2 yrs ago per pt  . Herpes genitalis in women   . Hypertension   . IBS (irritable bowel syndrome)    tx with diet per pt  . Low back pain    history  . Obesity   . Seasonal allergies   . Shortness of breath    occasional - exercise induced  . Systolic heart failure    May 2011 EF 35-40%, 04/03/11 EF 25-30%, 06/27/11 EF 30-35%   Review of Systems:  Review of Systems  Constitutional: Positive for chills, fever and malaise/fatigue.  HENT: Positive for congestion, ear pain, sinus pain and sore throat.   Eyes: Negative for blurred vision and pain.  Respiratory: Positive for cough and sputum production. Negative for shortness of breath and wheezing.   Cardiovascular: Negative  for chest pain and leg swelling.  Gastrointestinal: Negative for abdominal pain, diarrhea, nausea and vomiting.  Skin: Positive for itching (when taking amoxicillin). Negative for rash.  Neurological: Positive for headaches.   Physical Exam: Physical Exam  Constitutional: She is well-developed, well-nourished, and in no distress.  HENT:  Head: Normocephalic and atraumatic.  BL tympanic membranes with erythema   Eyes: Conjunctivae are normal. No scleral icterus.  Watery eyes, some injection  Neck: Neck supple.  Cardiovascular: Normal rate, regular rhythm and normal heart sounds.   Pulmonary/Chest: Effort normal and breath sounds normal. No respiratory distress. She has no wheezes. She has no rales.  Lungs clear to auscultation. Good air movement BL.   Abdominal: Soft. Bowel sounds are normal. She exhibits no distension. There is no tenderness.  Lymphadenopathy:    She has cervical adenopathy.  Skin: Skin is warm and dry. No rash noted. She is not diaphoretic.   Vitals:   06/30/16 0940  BP: (!) 151/94  Pulse: 96  Temp: 98.6 F (37 C)  TempSrc: Oral  SpO2: 96%  Weight: 190 lb 14.4 oz (86.6 kg)    Assessment & Plan:   See Encounters Tab for problem based charting.  Patient seen with Dr. Lynnae January

## 2016-06-30 NOTE — Patient Instructions (Addendum)
It was a pleasure meeting you today! I am sorry you are not feeling well!  1. I believe you have a sinus infection as well as an ear infection. For this I am starting you on an antibiotic called DOXYCYCLINE. You will take 1 pill twice daily for 10 days. Please do not take the other antibiotic that the ED prescribed for you. It is okay for you to use the cough syrup they prescribed you.  2. I also recommend using Flonase daily for 1-2 weeks to help with your congestion. Also, Mucinex would be a great addition as well.  3. Please remember to increase your fluids and get plenty of rest over the weekend. You may use Tylenol or your preferred NSAID to help with body aches and fevers.  4. Please call the clinic if you are not feeling better in 1 week.  5. Please take the remainder of your medications as directed.

## 2016-06-30 NOTE — Assessment & Plan Note (Addendum)
Pt here with symptoms consistent with secondary bacterial infection following likely viral illness. She presented to ED on 12/4 and was dx with AOM + bronchitis and was d/c home with a Rx for Amoxicillin and cough suppressant. She reports taking only 3 doses of this BID medication as she felt it made her itchy. Reports here today without improvement of her symptoms. PE suggestive of Acute Sinusitis.  -Doxycycline 100 mg BID x 10 days -Can continue cough suppressant, lungs clear to auscultation and has good air movement.  -Encouraged additional supportive therapy with Flonase, Mucinex and increased water intake.

## 2016-06-30 NOTE — Assessment & Plan Note (Signed)
Euvolemic on exam today. F/u with cardiology as scheduled.  -Continue current regimen as dictated by cards

## 2016-06-30 NOTE — Assessment & Plan Note (Signed)
Most recent HbA1c 7.3% in September. Currently takes Glimepiride 2 mg daily and metformin 1000 mg daily.  -Continue current regimen -She sees endocrinology

## 2016-07-03 NOTE — Progress Notes (Signed)
Internal Medicine Clinic Attending  I saw and evaluated the patient.  I personally confirmed the key portions of the history and exam documented by Dr. Molt and I reviewed pertinent patient test results.  The assessment, diagnosis, and plan were formulated together and I agree with the documentation in the resident's note. 

## 2016-07-06 ENCOUNTER — Telehealth: Payer: Self-pay | Admitting: Internal Medicine

## 2016-07-06 NOTE — Telephone Encounter (Signed)
Patient called requesting something for "yeast infxn". She stated after taking amoxicillin she gets yeast infxn. She stated she had talked to doctor on her last visit (12/8) & was told she will be given a medicine for yeast infection but was not prescribed one.

## 2016-07-06 NOTE — Telephone Encounter (Signed)
Patient calling concerning her amoxicillin (AMOXIL) 500 MG capsule.  Patient states she was to get a different medication instead of this antibiotic.  Patient also stated she was to receive information regarding a Living will.

## 2016-07-07 ENCOUNTER — Inpatient Hospital Stay (HOSPITAL_COMMUNITY): Admission: RE | Admit: 2016-07-07 | Payer: BLUE CROSS/BLUE SHIELD | Source: Ambulatory Visit

## 2016-07-07 MED ORDER — FLUCONAZOLE 150 MG PO TABS: 150.0000 mg | ORAL_TABLET | Freq: Once | ORAL | 0 refills | Status: AC

## 2016-07-07 NOTE — Telephone Encounter (Signed)
Called patient & informed her that Diflucan is ready for p/u @ [facility].

## 2016-07-07 NOTE — Telephone Encounter (Signed)
Just confirmed that Rx for Doxycycline was on 12-8 was [facility] to [id number] - [place], [address]... Uncertain as to why patient is taking amoxicillin? Rx for Fluconazole also sent to same pharmacy.  Re: Living will; was not discussed at our visit.Marland Kitchen

## 2016-08-07 ENCOUNTER — Other Ambulatory Visit (HOSPITAL_COMMUNITY): Payer: Self-pay | Admitting: *Deleted

## 2016-08-07 ENCOUNTER — Telehealth (HOSPITAL_COMMUNITY): Payer: Self-pay | Admitting: Vascular Surgery

## 2016-08-07 MED ORDER — FUROSEMIDE 40 MG PO TABS: 40.0000 mg | ORAL_TABLET | Freq: Two times a day (BID) | ORAL | 3 refills | Status: DC

## 2016-08-07 NOTE — Telephone Encounter (Signed)
Pt needs refill Furosemide sent to [facility] [address]

## 2016-08-18 ENCOUNTER — Encounter (HOSPITAL_COMMUNITY): Payer: Self-pay

## 2016-08-18 ENCOUNTER — Encounter: Payer: Self-pay | Admitting: Gynecology

## 2016-08-18 ENCOUNTER — Ambulatory Visit (HOSPITAL_COMMUNITY)
Admission: RE | Admit: 2016-08-18 | Discharge: 2016-08-18 | Disposition: A | Discharge status: Home / Self Care | Payer: BLUE CROSS/BLUE SHIELD | Source: Ambulatory Visit | Attending: Cardiology | Admitting: Cardiology

## 2016-08-18 VITALS — BP 142/88 | HR 93 | Wt 187.2 lb

## 2016-08-18 DIAGNOSIS — I429 Cardiomyopathy, unspecified: Secondary | ICD-10-CM | POA: Insufficient documentation

## 2016-08-18 DIAGNOSIS — Z8249 Family history of ischemic heart disease and other diseases of the circulatory system: Secondary | ICD-10-CM | POA: Diagnosis not present

## 2016-08-18 DIAGNOSIS — Z5189 Encounter for other specified aftercare: Secondary | ICD-10-CM | POA: Insufficient documentation

## 2016-08-18 DIAGNOSIS — E785 Hyperlipidemia, unspecified: Secondary | ICD-10-CM | POA: Diagnosis not present

## 2016-08-18 DIAGNOSIS — Z833 Family history of diabetes mellitus: Secondary | ICD-10-CM | POA: Insufficient documentation

## 2016-08-18 DIAGNOSIS — Z79899 Other long term (current) drug therapy: Secondary | ICD-10-CM | POA: Insufficient documentation

## 2016-08-18 DIAGNOSIS — I110 Hypertensive heart disease with heart failure: Secondary | ICD-10-CM | POA: Diagnosis not present

## 2016-08-18 DIAGNOSIS — E119 Type 2 diabetes mellitus without complications: Secondary | ICD-10-CM | POA: Insufficient documentation

## 2016-08-18 DIAGNOSIS — J069 Acute upper respiratory infection, unspecified: Secondary | ICD-10-CM | POA: Insufficient documentation

## 2016-08-18 DIAGNOSIS — Z803 Family history of malignant neoplasm of breast: Secondary | ICD-10-CM | POA: Diagnosis not present

## 2016-08-18 DIAGNOSIS — Z7984 Long term (current) use of oral hypoglycemic drugs: Secondary | ICD-10-CM | POA: Insufficient documentation

## 2016-08-18 DIAGNOSIS — Z7982 Long term (current) use of aspirin: Secondary | ICD-10-CM | POA: Diagnosis not present

## 2016-08-18 DIAGNOSIS — I10 Essential (primary) hypertension: Secondary | ICD-10-CM | POA: Diagnosis not present

## 2016-08-18 DIAGNOSIS — E042 Nontoxic multinodular goiter: Secondary | ICD-10-CM | POA: Insufficient documentation

## 2016-08-18 DIAGNOSIS — I5022 Chronic systolic (congestive) heart failure: Secondary | ICD-10-CM | POA: Diagnosis not present

## 2016-08-18 DIAGNOSIS — J019 Acute sinusitis, unspecified: Secondary | ICD-10-CM

## 2016-08-18 DIAGNOSIS — G4733 Obstructive sleep apnea (adult) (pediatric): Secondary | ICD-10-CM | POA: Diagnosis not present

## 2016-08-18 DIAGNOSIS — R9431 Abnormal electrocardiogram [ECG] [EKG]: Secondary | ICD-10-CM | POA: Insufficient documentation

## 2016-08-18 LAB — BASIC METABOLIC PANEL
ANION GAP: 8 (ref 5–15)
BUN: 12 mg/dL (ref 6–20)
CO2: 26 mmol/L (ref 22–32)
Calcium: 9.5 mg/dL (ref 8.9–10.3)
Chloride: 105 mmol/L (ref 101–111)
Creatinine, Ser: 0.58 mg/dL (ref 0.44–1.00)
GFR calc Af Amer: >60 mL/min (ref >60)
Glucose, Bld: 164 mg/dL — ABNORMAL HIGH (ref 65–99)
POTASSIUM: 4 mmol/L (ref 3.5–5.1)
Sodium: 139 mmol/L (ref 135–145)

## 2016-08-18 LAB — BRAIN NATRIURETIC PEPTIDE: B NATRIURETIC PEPTIDE 5: 196.4 pg/mL — AB (ref 0.0–100.0)

## 2016-08-18 MED ORDER — SACUBITRIL-VALSARTAN 97-103 MG PO TABS: 1.0000 | ORAL_TABLET | Freq: Two times a day (BID) | ORAL | 3 refills | Status: DC

## 2016-08-18 NOTE — Patient Instructions (Addendum)
EKG today within normal limits.  Routine lab work today. Will notify you of abnormal results, otherwise no news is good news!  Return Monday February 5th for repeat lab work.  INCREASE Entresto to 97/103 mg tablet twice daily.  Follow up 2 months with echocardiogram and appointment with Dr. Haroldine Laws.  Do the following things EVERYDAY: 1) Weigh yourself in the morning before breakfast. Write it down and keep it in a log. 2) Take your medicines as prescribed 3) Eat low salt foods-Limit salt (sodium) to 2000 mg per day.  4) Stay as active as you can everyday 5) Limit all fluids for the day to less than 2 liters

## 2016-08-18 NOTE — Progress Notes (Signed)
Patient ID: Susan Peck, female   DOB: April 17, 1966, 51 y.o.   MRN: [id number]   Advanced Heart Failure Clinic Note   PCP: Dr Rae Mar GYN: Dr Carolin Guernsey Pulmonologist: Dr Gwenette Greet HF: Dr. Haroldine Laws   HPI:  Apiphany is a 51 year old African American female with systolic heart failure, HTN, GERD, 11/23/11 S/P Hysterectomy, multinodular goiter and asthma.   2004 cardiac cath by Dr Lynnell Jude in [place], [place] with one blockage noted - no stent. EF said to be normal. About 1 year ago at [facility] noted murmur and she was referred to Dr Tamala Julian EF 25-30%. More recently, she has been followed in CHF clinic.   She returns today for regular follow up. Has been struggling with URI and trying OTC remedies with little relief.  Coughing with occasional production.  Denies fevers or chills., but she is perimenopausal so isn't sure about chills/hot flashes. Apart from coughing, breathing has been OK.  Does have some mild orthopnea and PND.  Weight at home 183-186. No exercising. SOB walking up steps in house. Hasn't used CPAP in many months.    04/03/2011  ECHO EF 25-30% 06/26/2012 ECHO EF 30-35%  02/29/2012 ECHO EF 35-40%  11/18/12 ECHO EF 55% Inferior wall mildly hypokinetic with septal HK Myoview 02/03/10 EF 42% No ischemia Septal and apical hypokinesis 11/14 Cardiac MRI  EF 52%- septal bounce suggestive of LBBB. Dyssynchrony from LBBB leads to the low calculated EF. Normal RV size and systolic function. No myocardial delayed enhancement, so no definite evidence for prior myocardial infarction, myocarditis, or infiltrative disease. 1/16 CT chest - small pleural effusions with early pulmonary edema. No ILD. 09/03/2014: ECHO EF 20-25%  6/16: ECHO EF 35-40% Cardiolite (6/16): EF 36%, no ischemia/infarction.  09/07/2015: ECHO EF 30-35%   CPX 11/20/2014  Peak VO2: 16.1 (76.8% predicted peak VO2) - when corrected to ideal BW pVO2 is 22.9 VE/VCO2 slope: 28.5 OUES: 1.59 Peak RER: 1.18  LHC/RHC 09/15/2015  RA = 2 RV =  22/1/2 PA = 27/8 (19) PCW = 8 Fick cardiac output/index = 5.22/2.8 PVR = 2.1 Ao sat = 98% PA sat = 70%, 71% Assessment: 1. Normal coronary arteries 2. Normal hemodynamics  3. LVEF 35-40% with global HK due to   Labs 08/19/14: K 4.1 Creatinine 0.62   Labs 10/28/2014: K 4.3 Creatinine 0.73  Labs 11/19/2014: K 3.6 Creatinine 0.64  Labs 6/16: digoxin 0.5 Labs 7/16: HIV negative, TSH normal Lipids 8/16: TC 206, TG 133, HDL 29, LDL 150 Labs 08/18/2015: 9.1  Labs 09/07/2015: K 4.4 Creating 0.68  Lab 12/14/2015: K 3.7 Creatinine 0.71   ROS: All other systems normal except as mentioned in HPI, past medical history and problem list.    Past Medical History:  Diagnosis Date  . Anxiety    no meds  . Asthma   . Depression    no meds  . Diabetes mellitus    recent dx 11/17/11 - started med 11/18/11  . GERD (gastroesophageal reflux disease)   . Goiter 07/27/2011   non-neoplastic goiter - fine needle aspiration - benign  . Heart murmur    dx 2 yrs ago per pt  . Herpes genitalis in women   . Hypertension   . IBS (irritable bowel syndrome)    tx with diet per pt  . Low back pain    history  . Obesity   . Seasonal allergies   . Shortness of breath    occasional - exercise induced  . Systolic heart failure  May 2011 EF 35-40%, 04/03/11 EF 25-30%, 06/27/11 EF 30-35%    Current Outpatient Prescriptions  Medication Sig Dispense Refill  . aspirin 81 MG chewable tablet Chew 81 mg by mouth every morning.     Marland Kitchen atorvastatin (LIPITOR) 40 MG tablet Take 1 tablet (40 mg total) by mouth daily. 30 tablet 6  . BAYER CONTOUR TEST test strip   1  . carvedilol (COREG) 25 MG tablet TAKE 1 TABLET BY MOUTH TWICE DAILY WITH MEALS 60 tablet 1  . doxycycline (VIBRAMYCIN) 100 MG capsule Take 1 capsule (100 mg total) by mouth 2 (two) times daily. 20 capsule 0  . DULoxetine (CYMBALTA) 30 MG capsule Take 1 capsule (30 mg total) by mouth daily. 90 capsule 2  . estradiol (ESTRACE) 1 MG tablet Take 1 tablet (1 mg  total) by mouth daily. 30 tablet 11  . furosemide (LASIX) 40 MG tablet Take 1 tablet (40 mg total) by mouth 2 (two) times daily. 30 tablet 3  . glimepiride (AMARYL) 2 MG tablet Take 2 mg by mouth daily with breakfast.    . levothyroxine (SYNTHROID, LEVOTHROID) 50 MCG tablet Take 1 tablet (50 mcg total) by mouth daily. 30 tablet 4  . metFORMIN (GLUCOPHAGE) 500 MG tablet Take 2 tablets (1,000 mg total) by mouth 2 (two) times daily with a meal. 120 tablet 2  . Polyethyl Glycol-Propyl Glycol (SYSTANE) 0.4-0.3 % SOLN Apply 1 drop to eye 2 (two) times daily as needed (dry eyes).    . sacubitril-valsartan (ENTRESTO) 49-51 MG Take 1 tablet by mouth 2 (two) times daily. 180 tablet 3  . albuterol (PROVENTIL HFA;VENTOLIN HFA) 108 (90 BASE) MCG/ACT inhaler Inhale 1-2 puffs into the lungs every 6 (six) hours as needed for wheezing or shortness of breath. (Patient not taking: Reported on 08/18/2016) 1 Inhaler 0  . chlorpheniramine-HYDROcodone (TUSSIONEX PENNKINETIC ER) 10-8 MG/5ML SUER Take 5 mLs by mouth every 12 (twelve) hours as needed for cough. (Patient not taking: Reported on 08/18/2016) 115 mL 0  . fluticasone (FLONASE) 50 MCG/ACT nasal spray Place 1 spray into both nostrils 2 (two) times daily as needed for allergies or rhinitis.     No current facility-administered medications for this encounter.    Facility-Administered Medications Ordered in Other Encounters  Medication Dose Route Frequency Provider Last Rate Last Dose  . aminophylline injection 150 mg  150 mg Intravenous BID PRN Larey Dresser, MD   150 mg at 12/24/14 1005     Allergies  Allergen Reactions  . Shrimp [Shellfish Allergy] Anaphylaxis    Social History   Social History  . Marital status: Legally Separated    Spouse name: N/A  . Number of children: Y  . Years of education: N/A   Occupational History  . 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 Smoking status: Never Smoker  . Smokeless  tobacco: Never Used  . Alcohol use No  . Drug use: No  . Sexual activity: No   Other Topics Concern  . Not on file   Social History Narrative   She lives with her husband and son.  She is works for [employer].    Family History  Problem Relation Age of Onset  . Heart disease Maternal Aunt   . Breast cancer Mother   . Thyroid disease Mother   . Hypertension Maternal Grandmother   . Diabetes Maternal Grandmother   . Breast cancer Maternal Aunt   . Heart disease Maternal Aunt   .  Diabetes Maternal Grandfather   . Diabetes Paternal Grandmother   . Diabetes Paternal Grandfather   . Hypertension Paternal Grandfather     PHYSICAL EXAM: Vitals:   08/18/16 1048  BP: (!) 142/88  Pulse: 93  SpO2: 98%  Weight: 187 lb 3.2 oz (84.9 kg)   Wt Readings from Last 3 Encounters:  08/18/16 187 lb 3.2 oz (84.9 kg)  06/30/16 190 lb 14.4 oz (86.6 kg)  04/10/16 193 lb 8 oz (87.8 kg)   General:  Well appearing. Ambulated in clinic without difficulty  HEENT:  Normal Neck: supple. JVP 6-7 cm. Carotids 2+ bilat; no bruits. + Nodular goiter R>L Cor: PMI nondisplaced. RRR. No M/G/R Lungs: CTAB, normal effort Abdomen: Obese, soft, NT, ND, no HSM. No bruits or masses. +BS  Extremities: no cyanosis, clubbing, rash., no peripheral edema Neuro: alert & oriented x 3, cranial nerves grossly intact. moves all 4 extremities w/o difficulty. Affect pleasant.  EKG: NSR 71 bpm  LBBB 160 ms QRS  ASSESSMENT & PLAN: 1. Chronic systolic HF: Nonischemic cardiomyopathy.  08/2015 ECHO per Dr Haroldine Laws EF 35-40%, outside ICD range (similar to cMRI).  NYHA class II-III symptoms. - Volume status looks stable on exam.  - Continue lasix 40 mg BID for now.     - On goal dose of carvedilol 25 bid.  -Can stay off dig.    - Intolerant to Bidil with severe headaches.  - Increase entresto to 97/103 mg BID. BMET today and in 10 days.   - Intolerant to spiro with painful gynecomastia.  - Chronic LBBB. Will check Echo next  visit.  If EF down, may benefit from CRT-D.  2. Chest pressure - No exertional chest pain. LHC 08/2015 with normal cors.   3. HTN:  - Increasing Entresto as above.  4. OSA:  - Encouraged to resume her CPAP.  5. Multinodular goiter:     - Follows with endocrinology.  6. Hyperlipidemia:  - Encouraged healthy lifestyle with exercise and weight loss.  - Continue atorvastatin 40 mg daily.   7. DM II: followed by  Endocrinologist. She is being followed at [facility] for assistance with diabetes management.  8. URI  - Finished course of doxycycline.  Not feeling better. Encouraged her to follow up with PCP - No wheezing.  Lungs sounds clear.   Meds as above.  Labs today and repeat in 10 days.  Follow up 2 months with MD with Echo.   Shirley Friar, PA-C  08/18/2016  Total time spent > 25 minutes. Over half that spent discussing the above.

## 2016-08-21 ENCOUNTER — Telehealth (HOSPITAL_COMMUNITY): Payer: Self-pay | Admitting: Pharmacist

## 2016-08-21 NOTE — Telephone Encounter (Signed)
Patient now has [employer] but copay for Delene Loll is $80/mo. I have spoken with Ms. Lovena Le and asked her to come by the clinic to pick up a copay card which should drop her copay to no more than $10/mo.   Ruta Hinds. Velva Harman, PharmD, BCPS, CPP Clinical Pharmacist Pager: [phone] Phone: [phone] 08/21/2016 10:14 AM

## 2016-08-23 ENCOUNTER — Ambulatory Visit (HOSPITAL_COMMUNITY)
Admission: RE | Admit: 2016-08-23 | Discharge: 2016-08-23 | Disposition: A | Discharge status: Home / Self Care | Payer: BLUE CROSS/BLUE SHIELD | Source: Ambulatory Visit | Attending: Internal Medicine | Admitting: Internal Medicine

## 2016-08-23 ENCOUNTER — Ambulatory Visit (INDEPENDENT_AMBULATORY_CARE_PROVIDER_SITE_OTHER): Payer: BLUE CROSS/BLUE SHIELD | Admitting: Internal Medicine

## 2016-08-23 DIAGNOSIS — R05 Cough: Secondary | ICD-10-CM | POA: Insufficient documentation

## 2016-08-23 DIAGNOSIS — R053 Chronic cough: Secondary | ICD-10-CM

## 2016-08-23 DIAGNOSIS — J9811 Atelectasis: Secondary | ICD-10-CM | POA: Diagnosis not present

## 2016-08-23 DIAGNOSIS — I517 Cardiomegaly: Secondary | ICD-10-CM | POA: Diagnosis not present

## 2016-08-23 DIAGNOSIS — R918 Other nonspecific abnormal finding of lung field: Secondary | ICD-10-CM | POA: Insufficient documentation

## 2016-08-23 DIAGNOSIS — Z8709 Personal history of other diseases of the respiratory system: Secondary | ICD-10-CM

## 2016-08-23 DIAGNOSIS — L929 Granulomatous disorder of the skin and subcutaneous tissue, unspecified: Secondary | ICD-10-CM | POA: Insufficient documentation

## 2016-08-23 DIAGNOSIS — R059 Cough, unspecified: Secondary | ICD-10-CM | POA: Insufficient documentation

## 2016-08-23 MED ORDER — FLUTICASONE PROPIONATE 50 MCG/ACT NA SUSP: 1.0000 | Freq: Two times a day (BID) | NASAL | 2 refills | Status: DC | PRN

## 2016-08-23 MED ORDER — RANITIDINE HCL 150 MG PO TABS: 150.0000 mg | ORAL_TABLET | Freq: Two times a day (BID) | ORAL | 1 refills | Status: DC

## 2016-08-23 MED ORDER — BENZONATATE 100 MG PO CAPS: 100.0000 mg | ORAL_CAPSULE | Freq: Four times a day (QID) | ORAL | 1 refills | Status: DC | PRN

## 2016-08-23 MED ORDER — LORATADINE 10 MG PO TABS: 10.0000 mg | ORAL_TABLET | Freq: Every day | ORAL | 2 refills | Status: DC

## 2016-08-23 MED ORDER — GUAIFENESIN-CODEINE 100-10 MG/5ML PO SOLN: 5.0000 mL | Freq: Four times a day (QID) | ORAL | 0 refills | Status: DC | PRN

## 2016-08-23 MED ORDER — ALBUTEROL SULFATE HFA 108 (90 BASE) MCG/ACT IN AERS: 1.0000 | INHALATION_SPRAY | Freq: Four times a day (QID) | RESPIRATORY_TRACT | 0 refills | Status: DC | PRN

## 2016-08-23 NOTE — Patient Instructions (Signed)
Thank you for your visit today  Please start taking the claritin daily, and also ranitidine daily. Please use the albuterol as needed.  Please get the xray of your lungs  Please follow up in 1 month to re-assess your symptoms.

## 2016-08-23 NOTE — Progress Notes (Signed)
    CC: chronic cough HPI: [name] is a 51 y.o. woman with PMH noted below here for chronic cough   Please see Problem List/A&P for the status of the patient's chronic medical problems   Past Medical History:  Diagnosis Date  . Anxiety    no meds  . Asthma   . Depression    no meds  . Diabetes mellitus    recent dx 11/17/11 - started med 11/18/11  . GERD (gastroesophageal reflux disease)   . Goiter 07/27/2011   non-neoplastic goiter - fine needle aspiration - benign  . Heart murmur    dx 2 yrs ago per pt  . Herpes genitalis in women   . Hypertension   . IBS (irritable bowel syndrome)    tx with diet per pt  . Low back pain    history  . Obesity   . Seasonal allergies   . Shortness of breath    occasional - exercise induced  . Systolic heart failure    May 2011 EF 35-40%, 04/03/11 EF 25-30%, 06/27/11 EF 30-35%    Review of Systems: Denies fevers, chills, weight loss fatigue Denies headaches. Has cough. Feels like food getting stuck sometimes Denies nausea, vomiting, abd pain, diarrhea Denies myalgias or joint pain    Physical Exam: Vitals:   08/23/16 1110  BP: (!) 141/100  Pulse: 94  Temp: 98.4 F (36.9 C)  TempSrc: Oral  SpO2: 99%    General: A&O, in NAD HEENT: EOMI, sclera white, conjunctiva pink . MMM. No pharyngeal exudates or erythema.  Neck: supple, midline trachea, no cervical lymphadenopathy, has goiter  CV: RRR, normal s1, s2, no m/r/g,  Resp: equal and symmetric breath sounds, no wheezing or crackles or rhonchi Abdomen: soft, nontender, nondistended, +BS Skin: warm, dry, intact Extremities: pulses intact b/l, no edema   Assessment & Plan:   See encounters tab for problem based medical decision making. Patient discussed with Dr. Dareen Piano

## 2016-08-23 NOTE — Assessment & Plan Note (Addendum)
Patient is here for evaluation of chronic cough. She says that In early December, she started feeling sick, was treated for acute otitis media, and then was given amoxicillin, and then was seen in the clinic for acute sinusitis and given doxycycline. She says that after this her cough decreased but never went away completely. She describes her cough as dry, sometimes productive with clear/yellow sputum. No blood.  She denies history of smoking. She denies fevers, chills. She denies sore throat, sinus pain, or myalgias. A baby is sick in house but only in last few days.  She has a history of asthma since the age of 33 years. She used to use albuterol which helped but currently not using anymore. She endorses dyspnea but on exertion only and says albuterol helps. She denies any GERD symptoms. She endorses some stuffiness. Denies symptoms of heart failure exacerbation like orthopnea or PND.  She had PFTs done which showed moderate-severe restrictive disease. FEV1 and FVC were unchanged after bronchodilator.  For this, she had a high resolution CT scan which did not show any interstitial lung disease.  She denies any pets and no exposure to passive smoke indoors.  Assessment: chronic cough. Differential includes upper airway reactive disease, cough variant untreated asthma, rhinosinusitis, untreated GERD, and ACEi induced- as she is using entresto  -Plan -Obtain CXR -Claritin -Ranitidine -Given albuterol PRN -RTC if symptoms persist- we may need to re-think about the choice of entresto as it may be entresto induced- given that she has an EF of 30-35% so it is an essential medicine for her.   Addendum: 2/7 Reviewed CXR. Prior CT in 2016 no evidence of ILD. Called pt to give her the results. She says she continues to cough and the cough has not gone away after starting the above medicines as well as albuterol. She feels like something gets stuck in her throat. She had a prior echo which shows HFrEF and  she is on entresto which she says has been for over a year. Asked her to come back to clinic to evaluate further.  Discussed with Dr Dareen Piano and Would like quantiferon test to check for latent TB, as well as pt may need a referral to ENT.  We also need to be concerned about entresto side effect as cough can happej any time

## 2016-08-24 NOTE — Progress Notes (Signed)
Internal Medicine Clinic Attending  Case discussed with Dr. Saraiya at the time of the visit.  We reviewed the resident's history and exam and pertinent patient test results.  I agree with the assessment, diagnosis, and plan of care documented in the resident's note.  

## 2016-08-26 ENCOUNTER — Encounter: Payer: Self-pay | Admitting: Internal Medicine

## 2016-08-28 ENCOUNTER — Inpatient Hospital Stay (HOSPITAL_COMMUNITY): Admission: RE | Admit: 2016-08-28 | Payer: BLUE CROSS/BLUE SHIELD | Source: Ambulatory Visit

## 2016-08-29 ENCOUNTER — Encounter: Payer: Self-pay | Admitting: Internal Medicine

## 2016-09-01 ENCOUNTER — Encounter: Payer: Self-pay | Admitting: Internal Medicine

## 2016-09-01 ENCOUNTER — Ambulatory Visit (INDEPENDENT_AMBULATORY_CARE_PROVIDER_SITE_OTHER): Payer: BLUE CROSS/BLUE SHIELD | Admitting: Internal Medicine

## 2016-09-01 VITALS — BP 147/98 | HR 92 | Temp 97.6°F | Ht 64.0 in | Wt 190.5 lb

## 2016-09-01 DIAGNOSIS — E119 Type 2 diabetes mellitus without complications: Secondary | ICD-10-CM | POA: Diagnosis not present

## 2016-09-01 DIAGNOSIS — R438 Other disturbances of smell and taste: Secondary | ICD-10-CM

## 2016-09-01 DIAGNOSIS — R05 Cough: Secondary | ICD-10-CM | POA: Diagnosis not present

## 2016-09-01 DIAGNOSIS — I5022 Chronic systolic (congestive) heart failure: Secondary | ICD-10-CM | POA: Diagnosis not present

## 2016-09-01 DIAGNOSIS — J984 Other disorders of lung: Secondary | ICD-10-CM | POA: Diagnosis not present

## 2016-09-01 DIAGNOSIS — Z7984 Long term (current) use of oral hypoglycemic drugs: Secondary | ICD-10-CM

## 2016-09-01 DIAGNOSIS — R053 Chronic cough: Secondary | ICD-10-CM

## 2016-09-01 MED ORDER — OMEPRAZOLE 40 MG PO CPDR: 40.0000 mg | DELAYED_RELEASE_CAPSULE | Freq: Two times a day (BID) | ORAL | 1 refills | Status: DC

## 2016-09-01 NOTE — Patient Instructions (Addendum)
It was a pleasure seeing you again today Susan Peck! I'm so sorry your cough has not resolved yet. Your symptoms sound consistent with laryngopharyngeal reflux. I have printed out information on this condition for you to read. It has many excellent suggestions that I hope you will try.  To treat this condition, I am starting you on Omeprazole (also known as Prilosec). This is a medicine that can be purchased over the counter, and may even be more affordable that way. You may buy the generic brand if you desire. Please take Omeprazole 40 mg twice daily for the next 3 months. I do want to see you back here in clinic in 6 weeks to see how you are doing!

## 2016-09-01 NOTE — Progress Notes (Signed)
CC: chronic cough  HPI:  [name] is a 51 y.o. F with medical history as outlined below who presents to the acute care clinic for follow-up evaluation of her chronic cough.   Chronic cough: Initially seen in our clinic 06/30/16 with 3 week hx of cough, runny nose, sore throat and myalgias which had begun to resolve however after about 2 days of feeling better she reported she "felt like she got hit with a truck." She was seen in the ED at that time and was given Amoxicillin + cough syrup Rx for diagnosis of acute otitis media. She followed up with our clinic reporting itching with amoxicillin. She was diagnosed with acute maxillary sinusitits due to above symptoms + sinus tenderness and treated with a 10 day course of Doxycycline. She was seen again 08/23/16 and reported all symptoms had resolved except for cough. CXR was obtained which showed cardiomegaly without acute congestion as well as tiny calcified pulmonary nodules. She was treated with Claritin, Ranitidine and albuterol PRN.   Today, she reports that the cough is still present and that now her voice is raspy. Reports she keeps coughing as she feels like there is mucous in her throat however is only expectorating a small amount of mucous. She provided further history that this cough has been intermittent for several years and in the past seemed to respond to promethazine cough syrup. She feels like there is mucous or "something" in her throat that she cant clear, and in addition complains of a foul taste in her mouth. Also endorses dysphagia (both liquid and solid) and heartburn.   Chronic Systolic Heart Failure, Non-Ischemic: managed by Advanced HF team. Currently on Entresto which was increased late Jan.   Asthma vs RLD: She reports a history of asthma, diagnosed at age of 24 and used albuterol inhalers occasionally. PFT's performed 2016 was without obstructive disease however was concerning for restrictive lung disease. Following  the PFT's a CT was obtained which did not demonstrate interstitial lung disease.    Type 2 Diabetes: HbA1c 03/2016 7.3%. Takes Metformin 2000 mg daily  Past Medical History:  Diagnosis Date  . Anxiety    no meds  . Asthma   . Depression    no meds  . Diabetes mellitus    recent dx 11/17/11 - started med 11/18/11  . GERD (gastroesophageal reflux disease)   . Goiter 07/27/2011   non-neoplastic goiter - fine needle aspiration - benign  . Heart murmur    dx 2 yrs ago per pt  . Herpes genitalis in women   . Hypertension   . IBS (irritable bowel syndrome)    tx with diet per pt  . Low back pain    history  . Obesity   . Seasonal allergies   . Shortness of breath    occasional - exercise induced  . Systolic heart failure    May 2011 EF 35-40%, 04/03/11 EF 25-30%, 06/27/11 EF 30-35%    Review of Systems:  Review of Systems  Constitutional: Negative for chills, fever and weight loss.  HENT: Positive for sore throat. Negative for ear pain, sinus pain and tinnitus.   Respiratory: Positive for cough and sputum production (occasional scant). Negative for hemoptysis, shortness of breath and wheezing.   Cardiovascular: Negative for chest pain and leg swelling.  Gastrointestinal: Positive for constipation (occasional), heartburn and vomiting (regurgitation). Negative for abdominal pain, diarrhea and nausea.  Neurological: Negative for dizziness, weakness and headaches.   Physical Exam: Physical  Exam  Constitutional: She appears well-developed and well-nourished.  HENT:  Head: Normocephalic and atraumatic.  Mouth/Throat: No oropharyngeal exudate.  Neck: No thyromegaly present.  Cardiovascular: Normal rate, regular rhythm and normal heart sounds.   Pulmonary/Chest: Effort normal and breath sounds normal. No respiratory distress. She has no wheezes.  Abdominal: Soft. Bowel sounds are normal. She exhibits no distension. There is no tenderness.  Lymphadenopathy:    She has no cervical  adenopathy.  Skin: Skin is warm and dry. No rash noted. She is not diaphoretic.  Psychiatric: Her mood appears anxious. She is agitated. She is not aggressive.    Vitals:   09/01/16 1332  BP: (!) 147/98  Pulse: 92  Temp: 97.6 F (36.4 C)  TempSrc: Oral  SpO2: 98%  Weight: 190 lb 8 oz (86.4 kg)  Height: 5\' 4"  (1.626 m)    Assessment & Plan:   See Encounters Tab for problem based charting.  Patient discussed with Dr. Daryll Drown

## 2016-09-03 ENCOUNTER — Encounter: Payer: Self-pay | Admitting: Internal Medicine

## 2016-09-04 ENCOUNTER — Encounter: Payer: Self-pay | Admitting: Internal Medicine

## 2016-09-04 ENCOUNTER — Ambulatory Visit (HOSPITAL_COMMUNITY)
Admission: RE | Admit: 2016-09-04 | Discharge: 2016-09-04 | Disposition: A | Discharge status: Home / Self Care | Payer: BLUE CROSS/BLUE SHIELD | Source: Ambulatory Visit | Attending: Internal Medicine | Admitting: Internal Medicine

## 2016-09-04 DIAGNOSIS — I5022 Chronic systolic (congestive) heart failure: Secondary | ICD-10-CM | POA: Insufficient documentation

## 2016-09-04 LAB — BASIC METABOLIC PANEL
ANION GAP: 7 (ref 5–15)
BUN: 9 mg/dL (ref 6–20)
CALCIUM: 9.5 mg/dL (ref 8.9–10.3)
CO2: 27 mmol/L (ref 22–32)
Chloride: 104 mmol/L (ref 101–111)
Creatinine, Ser: 0.61 mg/dL (ref 0.44–1.00)
GFR calc non Af Amer: >60 mL/min (ref >60)
Glucose, Bld: 234 mg/dL — ABNORMAL HIGH (ref 65–99)
POTASSIUM: 4 mmol/L (ref 3.5–5.1)
SODIUM: 138 mmol/L (ref 135–145)

## 2016-09-05 ENCOUNTER — Encounter: Payer: Self-pay | Admitting: Internal Medicine

## 2016-09-05 ENCOUNTER — Telehealth: Payer: Self-pay | Admitting: Internal Medicine

## 2016-09-05 DIAGNOSIS — J019 Acute sinusitis, unspecified: Secondary | ICD-10-CM

## 2016-09-05 NOTE — Telephone Encounter (Signed)
Yes please refer to ENT   Thanks  Dr. Brand Males, M.D., [facility]C.P Pulmonary and Critical Care Medicine Staff Physician [facility] Pager: [phone], If no answer or between  15:00h - 7:00h: call [phone]  09/05/2016 3:34 PM

## 2016-09-05 NOTE — Telephone Encounter (Signed)
Spoke with pt . She is aware that we will be making his referral. Referral has been placed. Nothing further was needed.

## 2016-09-05 NOTE — Telephone Encounter (Signed)
Spoke with pt, who is requesting is referral to ENT. Pt states she was dx with silence reflux by her [facility]. Pt states she has had a prod cough with clear to yellow mucus X 31mo. Pt states her PCP suggest that she take omeprazole 40mg  bid for 3 months, to see how symptoms improve before sending to ENT. Pt would like to know if MR would go ahead and ref to ENT, as she has head pressure, increased sob & trouble swallowing.  MR please advise. Thanks.

## 2016-09-08 NOTE — Assessment & Plan Note (Signed)
Continues to complain of cough despite multiple treatments. Today volunteered that she feels like something keeps getting stuck in her throat and the cough is because she is trying to clear it. She also endorses sour taste in mouth in morning.  -Suspect laryngopharyngeal reflux -Rx for Omeprazole BID  -Informed patient it will take several months to see improvement of this condition with above therapy however did provide numerous lifestyle modifications that have been shown to improve symptoms -Consider ENT referral

## 2016-09-12 IMAGING — NM NM MYOCAR MULTI W/ SPECT
3 series · 18 of 18 positions shown · non-contrast
Comparison: none

[Series 1: rest · 6.51mm/px · 6 of 64 frames shown]
[frame 6/64]
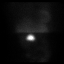
[frame 16/64]
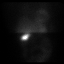
[frame 27/64]
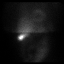
[frame 38/64]
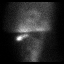
[frame 48/64]
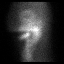
[frame 59/64]
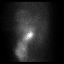

[Series 2: stress · 6.51mm/px · 6 of 64 frames shown (1 of 2)]
[frame 6/64]
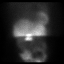
[frame 16/64]
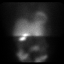
[frame 27/64]
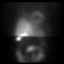
[frame 38/64]
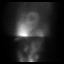
[frame 48/64]
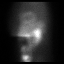
[frame 59/64]
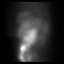

[Series 2: stress · 6.51mm/px · 6 of 512 frames shown (2 of 2)]
[frame 43/512]
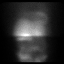
[frame 128/512]
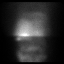
[frame 214/512]
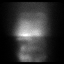
[frame 299/512]
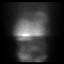
[frame 384/512]
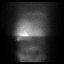
[frame 470/512]
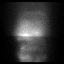

[18 of 18 positions shown; findings below may reference images not displayed]

Canned report from images found in remote index.

Refer to host system for actual result text.

## 2016-09-14 NOTE — Progress Notes (Signed)
Internal Medicine Clinic Attending  Case discussed with Dr. Molt at the time of the visit.  We reviewed the resident's history and exam and pertinent patient test results.  I agree with the assessment, diagnosis, and plan of care documented in the resident's note. 

## 2016-09-20 DIAGNOSIS — R490 Dysphonia: Secondary | ICD-10-CM | POA: Insufficient documentation

## 2016-09-20 DIAGNOSIS — K219 Gastro-esophageal reflux disease without esophagitis: Secondary | ICD-10-CM | POA: Insufficient documentation

## 2016-09-20 DIAGNOSIS — J324 Chronic pansinusitis: Secondary | ICD-10-CM | POA: Insufficient documentation

## 2016-09-20 DIAGNOSIS — J342 Deviated nasal septum: Secondary | ICD-10-CM | POA: Insufficient documentation

## 2016-09-22 ENCOUNTER — Encounter: Payer: Self-pay | Admitting: Internal Medicine

## 2016-09-26 ENCOUNTER — Other Ambulatory Visit: Payer: Self-pay

## 2016-09-27 MED ORDER — RANITIDINE HCL 150 MG PO TABS: 150.0000 mg | ORAL_TABLET | Freq: Two times a day (BID) | ORAL | 1 refills | Status: DC

## 2016-09-27 MED ORDER — FLUTICASONE PROPIONATE 50 MCG/ACT NA SUSP: 1.0000 | Freq: Two times a day (BID) | NASAL | 2 refills | Status: DC | PRN

## 2016-09-27 MED ORDER — OMEPRAZOLE 40 MG PO CPDR: 40.0000 mg | DELAYED_RELEASE_CAPSULE | Freq: Two times a day (BID) | ORAL | 1 refills | Status: DC

## 2016-10-03 ENCOUNTER — Encounter (HOSPITAL_COMMUNITY): Payer: Self-pay | Admitting: Emergency Medicine

## 2016-10-03 ENCOUNTER — Emergency Department (HOSPITAL_COMMUNITY)
Admission: EM | Admit: 2016-10-03 | Discharge: 2016-10-03 | Disposition: A | Discharge status: Home / Self Care | Payer: BLUE CROSS/BLUE SHIELD | Attending: Emergency Medicine | Admitting: Emergency Medicine

## 2016-10-03 ENCOUNTER — Emergency Department (HOSPITAL_COMMUNITY): Payer: BLUE CROSS/BLUE SHIELD

## 2016-10-03 DIAGNOSIS — R1013 Epigastric pain: Secondary | ICD-10-CM | POA: Insufficient documentation

## 2016-10-03 DIAGNOSIS — I5022 Chronic systolic (congestive) heart failure: Secondary | ICD-10-CM | POA: Insufficient documentation

## 2016-10-03 DIAGNOSIS — R072 Precordial pain: Secondary | ICD-10-CM | POA: Diagnosis not present

## 2016-10-03 DIAGNOSIS — J45909 Unspecified asthma, uncomplicated: Secondary | ICD-10-CM | POA: Insufficient documentation

## 2016-10-03 DIAGNOSIS — R079 Chest pain, unspecified: Secondary | ICD-10-CM | POA: Diagnosis present

## 2016-10-03 DIAGNOSIS — Z79899 Other long term (current) drug therapy: Secondary | ICD-10-CM | POA: Insufficient documentation

## 2016-10-03 DIAGNOSIS — I110 Hypertensive heart disease with heart failure: Secondary | ICD-10-CM | POA: Insufficient documentation

## 2016-10-03 DIAGNOSIS — R110 Nausea: Secondary | ICD-10-CM | POA: Diagnosis not present

## 2016-10-03 DIAGNOSIS — E119 Type 2 diabetes mellitus without complications: Secondary | ICD-10-CM | POA: Diagnosis not present

## 2016-10-03 DIAGNOSIS — R140 Abdominal distension (gaseous): Secondary | ICD-10-CM

## 2016-10-03 LAB — CBC
HEMATOCRIT: 36.2 % (ref 36.0–46.0)
Hemoglobin: 12.3 g/dL (ref 12.0–15.0)
MCH: 26.6 pg (ref 26.0–34.0)
MCHC: 34 g/dL (ref 30.0–36.0)
MCV: 78.4 fL (ref 78.0–100.0)
Platelets: 246 10*3/uL (ref 150–400)
RBC: 4.62 MIL/uL (ref 3.87–5.11)
RDW: 13.8 % (ref 11.5–15.5)
WBC: 6.6 10*3/uL (ref 4.0–10.5)

## 2016-10-03 LAB — BASIC METABOLIC PANEL
Anion gap: 10 (ref 5–15)
BUN: 11 mg/dL (ref 6–20)
CALCIUM: 9.2 mg/dL (ref 8.9–10.3)
CO2: 25 mmol/L (ref 22–32)
Chloride: 102 mmol/L (ref 101–111)
Creatinine, Ser: 0.57 mg/dL (ref 0.44–1.00)
GFR calc Af Amer: >60 mL/min (ref >60)
GLUCOSE: 211 mg/dL — AB (ref 65–99)
Potassium: 3.9 mmol/L (ref 3.5–5.1)
Sodium: 137 mmol/L (ref 135–145)

## 2016-10-03 LAB — HEPATIC FUNCTION PANEL
ALT: 29 U/L (ref 14–54)
AST: 27 U/L (ref 15–41)
Albumin: 3.8 g/dL (ref 3.5–5.0)
Alkaline Phosphatase: 106 U/L (ref 38–126)
BILIRUBIN DIRECT: 0.2 mg/dL (ref 0.1–0.5)
BILIRUBIN INDIRECT: 0.7 mg/dL (ref 0.3–0.9)
BILIRUBIN TOTAL: 0.9 mg/dL (ref 0.3–1.2)
Total Protein: 7.4 g/dL (ref 6.5–8.1)

## 2016-10-03 LAB — I-STAT TROPONIN, ED: TROPONIN I, POC: 0 ng/mL (ref 0.00–0.08)

## 2016-10-03 LAB — LIPASE, BLOOD: Lipase: 18 U/L (ref 11–51)

## 2016-10-03 MED ORDER — GI COCKTAIL ~~LOC~~
30.0000 mL | Freq: Once | ORAL | Status: AC
Start: 2016-10-03 — End: 2016-10-03
  Administered 2016-10-03: 30 mL via ORAL
  Filled 2016-10-03: qty 30

## 2016-10-03 MED ORDER — SUCRALFATE 1 GM/10ML PO SUSP: 1.0000 g | Freq: Three times a day (TID) | ORAL | 0 refills | Status: DC

## 2016-10-03 MED ORDER — DICYCLOMINE HCL 20 MG PO TABS: 20.0000 mg | ORAL_TABLET | Freq: Two times a day (BID) | ORAL | 0 refills | Status: DC

## 2016-10-03 MED ORDER — ALBUTEROL SULFATE HFA 108 (90 BASE) MCG/ACT IN AERS
2.0000 | INHALATION_SPRAY | Freq: Once | RESPIRATORY_TRACT | Status: AC
  Administered 2016-10-03: 2 via RESPIRATORY_TRACT
  Filled 2016-10-03: qty 6.7

## 2016-10-03 NOTE — ED Provider Notes (Signed)
Emergency Department Provider Note   I have reviewed the triage vital signs and the nursing notes.   HISTORY  Chief Complaint Shortness of Breath and Chest Pain   HPI Susan Peck is a 51 y.o. female with PMH of GERD, IBS, DM, CHF, and depression presents to the ED with acute on chronic chest pain and dyspnea. The patient reports symptoms primarily with eating or drinking. She describes a pressure in the chest radiating from her throat to her epigastric region. No exertional or pleuritic component. Symptoms worse and night and when lying flat. Also has some associated constipation. Continues to pass gas. No vomiting. Had GI appointment scheduled but cancelled appointment due to weather. No modifying factors.    Past Medical History:  Diagnosis Date  . Anxiety    no meds  . Asthma   . Depression    no meds  . Diabetes mellitus    recent dx 11/17/11 - started med 11/18/11  . GERD (gastroesophageal reflux disease)   . Goiter 07/27/2011   non-neoplastic goiter - fine needle aspiration - benign  . Heart murmur    dx 2 yrs ago per pt  . Herpes genitalis in women   . Hypertension   . IBS (irritable bowel syndrome)    tx with diet per pt  . Low back pain    history  . Obesity   . Seasonal allergies   . Shortness of breath    occasional - exercise induced  . Systolic heart failure    May 2011 EF 35-40%, 04/03/11 EF 25-30%, 06/27/11 EF 30-35%    Patient Active Problem List   Diagnosis Date Noted  . Chronic cough 08/23/2016  . HLD (hyperlipidemia) 08/18/2016  . Acute sinusitis with symptoms > 10 days 06/30/2016  . History of colonic polyps 03/08/2016  . Intercostal muscle pain 12/28/2015  . Chronic hip pain 12/28/2015  . Diabetes mellitus type II, uncontrolled ([place]) 10/05/2015  . HTN (hypertension) 09/17/2014  . Perimenopause 04/15/2013  . Goiter 07/10/2011  . Obstructive sleep apnea 05/29/2011  . Chronic systolic heart failure ([place]) 04/10/2011    Past Surgical  History:  Procedure Laterality Date  . ABDOMINAL HYSTERECTOMY    . cardiac catherization  2004   [place], [place] - Dr Lynnell Jude  . CARDIAC CATHETERIZATION    . CARDIAC CATHETERIZATION N/A 09/15/2015   Procedure: Right/Left Heart Cath and Coronary Angiography;  Surgeon: Jolaine Artist, MD;  Location: [facility];  Service: Cardiovascular;  Laterality: N/A;  . COLONOSCOPY WITH PROPOFOL N/A 03/26/2015   Procedure: COLONOSCOPY WITH PROPOFOL;  Surgeon: Carol Ada, MD;  Location: [facility];  Service: Endoscopy;  Laterality: N/A;  . colonscopy    . CYSTOSCOPY  11/23/2011   Procedure: CYSTOSCOPY;  Surgeon: Jolayne Haines, MD;  Location: [facility];  Service: Gynecology;  Laterality: N/A;  . DIAGNOSTIC LAPAROSCOPY     of pelvis  . DILATION AND CURETTAGE OF UTERUS  12/2006,  10/2005   hysteroscopy surgery x 2  . LAPAROSCOPIC ASSISTED VAGINAL HYSTERECTOMY  11/23/2011   Procedure: LAPAROSCOPIC ASSISTED VAGINAL HYSTERECTOMY;  Surgeon: Jolayne Haines, MD;  Location: [facility];  Service: Gynecology;  Laterality: N/A;  . NOVASURE ABLATION     10/2005  . SALPINGOOPHORECTOMY  11/23/2011   Procedure: SALPINGO OOPHERECTOMY;  Surgeon: Jolayne Haines, MD;  Location: [facility];  Service: Gynecology;  Laterality: Bilateral;  . SVD     x 3  . TUBAL LIGATION    . WISDOM TOOTH EXTRACTION  Current Outpatient Rx  . Order #: [id number] Class: Historical Med  . Order #: [id number] Class: Print  . Order #: [id number] Class: Historical Med  . Order #: [id number] Class: Historical Med  . Order #: [id number] Class: Historical Med  . Order #: [id number] Class: Historical Med  . Order #: [id number] Class: Normal  . Order #: [id number] Class: Historical Med  . Order #: [id number] Class: Normal  . Order #: [id number] Class: Normal  . Order #: [id number] Class: Normal  . Order #: [id number] Class: Historical Med  . Order #: [id number] Class: Normal  . Order #: [id number] Class: Normal  . Order #: [id number] Class: Normal  . Order #:  [id number] Class: Historical Med  . Order #: [id number] Class: Historical Med  . Order #: [id number] Class: Historical Med  . Order #: [id number] Class: Historical Med  . Order #: [id number] Class: Historical Med  . Order #: [id number] Class: Normal  . Order #: [id number] Class: Historical Med  . Order #: [id number] Class: Normal  . Order #: [id number] Class: Print  . Order #: [id number] Class: Historical Med  . Order #: [id number] Class: Print  . Order #: [id number] Class: Normal  . Order #: [id number] Class: Print  . Order #: [id number] Class: Normal  . Order #: [id number] Class: Normal  . Order #: [id number] Class: Normal  . Order #: [id number] Class: Print    Allergies Shrimp [shellfish allergy]  Family History  Problem Relation Age of Onset  . Heart disease Maternal Aunt   . Breast cancer Mother   . Thyroid disease Mother   . Hypertension Maternal Grandmother   . Diabetes Maternal Grandmother   . Breast cancer Maternal Aunt   . Heart disease Maternal Aunt   . Diabetes Maternal Grandfather   . Diabetes Paternal Grandmother   . Diabetes Paternal Grandfather   . Hypertension Paternal Grandfather     Social History Social History  Substance Use Topics  . Smoking status: Never Smoker  . Smokeless tobacco: Never Used  . Alcohol use No    Review of Systems  10-point ROS otherwise negative.  ____________________________________________   PHYSICAL EXAM:  VITAL SIGNS: ED Triage Vitals  Enc Vitals Group     BP 10/03/16 1219 (!) 155/102     Pulse Rate 10/03/16 1219 105     Resp 10/03/16 1219 20     Temp 10/03/16 1219 97.9 F (36.6 C)     Temp Source 10/03/16 1219 Oral     SpO2 10/03/16 1219 98 %     Pain Score 10/03/16 1215 8    Constitutional: Alert and oriented. Well appearing and in no acute distress. Eyes: Conjunctivae are normal.  Head: Atraumatic. Nose: No congestion/rhinnorhea. Mouth/Throat: Mucous membranes are moist.  Oropharynx non-erythematous. Neck: No stridor.     Cardiovascular: Normal rate, regular rhythm. Good peripheral circulation. Grossly normal heart sounds.   Respiratory: Normal respiratory effort.  No retractions. Lungs CTAB. Gastrointestinal: Soft and nontender. No distention.  Musculoskeletal: No lower extremity tenderness nor edema. No gross deformities of extremities. Neurologic:  Normal speech and language. No gross focal neurologic deficits are appreciated.  Skin:  Skin is warm, dry and intact. No rash noted. Psychiatric: Mood and affect are normal. Speech and behavior are normal.  ____________________________________________   LABS (all labs ordered are listed, but only abnormal results are displayed)  Labs Reviewed  BASIC METABOLIC PANEL - Abnormal; Notable for the following:       Result Value   Glucose, Bld 211 (*)    All other components within normal limits  CBC  HEPATIC FUNCTION PANEL  LIPASE, BLOOD  I-STAT TROPOININ, ED   ____________________________________________  EKG   EKG Interpretation  Date/Time:  Tuesday October 03 2016 12:14:27 EDT Ventricular Rate:  102 PR Interval:  154 QRS Duration: 150 QT Interval:  412 QTC Calculation: 536 R Axis:   130 Text Interpretation:  Sinus tachycardia Possible Left atrial enlargement Right axis deviation Non-specific intra-ventricular conduction block Abnormal ECG Similar to prior. No STEMI.  Confirmed by LONG MD, JOSHUA [phone]) on 10/03/2016 8:31:13 PM       ____________________________________________  RADIOLOGY  Dg Chest 2 View  Result Date: 10/03/2016 CLINICAL DATA:  Shortness of breath, central chest pain for 2 weeks EXAM: CHEST  2 VIEW COMPARISON:  08/23/2016 FINDINGS: Bibasilar atelectasis. Small left pleural effusion. Heart is borderline in size. No overt edema. No acute bony abnormality. IMPRESSION: Bibasilar atelectasis.  Small left effusion. Electronically Signed   By: Rolm Baptise M.D.   On: 10/03/2016 12:46   Dg Abd 2 Views  Result Date:  10/03/2016 CLINICAL DATA:  Abdominal distension, constipation EXAM: ABDOMEN - 2 VIEW COMPARISON:  12/28/2015 FINDINGS: Scattered large and small bowel gas is noted. Fecal material is noted throughout the colon consistent with a mild degree of constipation but stable from previous exam. No free air is seen. No abnormal mass or abnormal calcifications are noted. No bony abnormality is noted. IMPRESSION: Mild constipation.  No acute abnormality seen. Electronically Signed   By: Inez Catalina M.D.   On: 10/03/2016 14:29    ____________________________________________   PROCEDURES  Procedure(s) performed:   Procedures  None ____________________________________________   INITIAL IMPRESSION / ASSESSMENT AND PLAN / ED COURSE  Pertinent labs & imaging results that were available during my care of the patient were reviewed by me and considered in my medical decision making (see chart for details).  Patient presents to the ED for evaluation of chest pain and epigastric discomfort. Symptoms seem associated with food and most consistent with GI etiology. Had GI appointment scheduled but cancelled due to weather. No productive cough. Exam is unremarkable. Symptoms have been chronic in nature and gradually worsening.   Differential includes all life-threatening causes for chest pain. This includes but is not exclusive to acute coronary syndrome, aortic dissection, pulmonary embolism, cardiac tamponade, community-acquired pneumonia, pericarditis, musculoskeletal chest wall pain, etc.  Will add on abdominal x-ray with constipation history. Plan for GI cocktail and reassessment.   04:14 PM Patient is feeling better after GI cocktail. She call her GI who has made an appointment for 11 AM tomorrow. Gave albuterol inh prior to d/c.   At this time, I do not feel there is any life-threatening condition present. I have reviewed and discussed all results (EKG, imaging, lab, urine as appropriate), exam findings  with patient. I have reviewed nursing notes and appropriate previous records.  I feel the patient is safe to be discharged home without further emergent workup. Discussed usual and customary return precautions. Patient and family (if present) verbalize understanding and are comfortable with this plan.  Patient will follow-up with their primary care provider. If they do not have a primary care provider, information for follow-up has been provided to them. All questions have been answered.  ____________________________________________  FINAL CLINICAL IMPRESSION(S) / ED DIAGNOSES  Final diagnoses:  Precordial chest pain  Nausea  Epigastric abdominal pain     MEDICATIONS GIVEN DURING THIS VISIT:  Medications  gi cocktail (Maalox,Lidocaine,Donnatal) (30 mLs Oral Given 10/03/16 1413)  albuterol (PROVENTIL HFA;VENTOLIN HFA) 108 (90 Base) MCG/ACT inhaler 2 puff (2 puffs Inhalation Given 10/03/16 1645)     NEW  OUTPATIENT MEDICATIONS STARTED DURING THIS VISIT:  Discharge Medication List as of 10/03/2016  4:20 PM    START taking these medications   Details  dicyclomine (BENTYL) 20 MG tablet Take 1 tablet (20 mg total) by mouth 2 (two) times daily., Starting Tue 10/03/2016, Print    sucralfate (CARAFATE) 1 GM/10ML suspension Take 10 mLs (1 g total) by mouth 4 (four) times daily -  with meals and at bedtime., Starting Tue 10/03/2016, Print          Note:  This document was prepared using Dragon voice recognition software and may include unintentional dictation errors.  Nanda Quinton, MD Emergency Medicine   Margette Fast, MD 10/03/16 2033

## 2016-10-03 NOTE — ED Triage Notes (Addendum)
Sob x 2 weeks ,, states could not stand it any longer , denies cough states haS HX OF CHF,htn tightness in chest from having to breath hard, states was dx with  silent reflux and everything she eats gets stuck in her chest and upper abd and throat and it makes it hard to breath

## 2016-10-03 NOTE — ED Notes (Signed)
ED Provider at bedside. 

## 2016-10-03 NOTE — ED Notes (Signed)
Patient transported to X-ray 

## 2016-10-03 NOTE — Discharge Instructions (Signed)
You have been seen in the Emergency Department (ED) for abdominal pain.  Your evaluation did not identify a clear cause of your symptoms but was generally reassuring.  You should keep your gastroenterology appointment tomorrow at 11 AM.   Please follow up as instructed above regarding today?s emergent visit and the symptoms that are bothering you.  Return to the ED if your abdominal pain worsens or fails to improve, you develop bloody vomiting, bloody diarrhea, you are unable to tolerate fluids due to vomiting, fever greater than 101, or other symptoms that concern you.

## 2016-10-04 ENCOUNTER — Other Ambulatory Visit: Payer: Self-pay | Admitting: Gastroenterology

## 2016-10-16 ENCOUNTER — Ambulatory Visit (HOSPITAL_BASED_OUTPATIENT_CLINIC_OR_DEPARTMENT_OTHER)
Admission: RE | Admit: 2016-10-16 | Discharge: 2016-10-16 | Disposition: A | Discharge status: Home / Self Care | Payer: BLUE CROSS/BLUE SHIELD | Source: Ambulatory Visit | Attending: Internal Medicine | Admitting: Internal Medicine

## 2016-10-16 ENCOUNTER — Encounter (HOSPITAL_COMMUNITY): Payer: Self-pay | Admitting: Internal Medicine

## 2016-10-16 ENCOUNTER — Ambulatory Visit (HOSPITAL_COMMUNITY)
Admission: RE | Admit: 2016-10-16 | Discharge: 2016-10-16 | Disposition: A | Discharge status: Home / Self Care | Payer: BLUE CROSS/BLUE SHIELD | Source: Ambulatory Visit | Attending: Internal Medicine | Admitting: Internal Medicine

## 2016-10-16 VITALS — BP 110/70 | HR 70 | Wt 181.2 lb

## 2016-10-16 DIAGNOSIS — E785 Hyperlipidemia, unspecified: Secondary | ICD-10-CM | POA: Insufficient documentation

## 2016-10-16 DIAGNOSIS — Z7982 Long term (current) use of aspirin: Secondary | ICD-10-CM | POA: Insufficient documentation

## 2016-10-16 DIAGNOSIS — Z79899 Other long term (current) drug therapy: Secondary | ICD-10-CM | POA: Diagnosis not present

## 2016-10-16 DIAGNOSIS — Z8249 Family history of ischemic heart disease and other diseases of the circulatory system: Secondary | ICD-10-CM | POA: Insufficient documentation

## 2016-10-16 DIAGNOSIS — J45909 Unspecified asthma, uncomplicated: Secondary | ICD-10-CM | POA: Insufficient documentation

## 2016-10-16 DIAGNOSIS — E119 Type 2 diabetes mellitus without complications: Secondary | ICD-10-CM | POA: Insufficient documentation

## 2016-10-16 DIAGNOSIS — Z7984 Long term (current) use of oral hypoglycemic drugs: Secondary | ICD-10-CM | POA: Diagnosis not present

## 2016-10-16 DIAGNOSIS — E042 Nontoxic multinodular goiter: Secondary | ICD-10-CM | POA: Insufficient documentation

## 2016-10-16 DIAGNOSIS — Z09 Encounter for follow-up examination after completed treatment for conditions other than malignant neoplasm: Secondary | ICD-10-CM | POA: Diagnosis not present

## 2016-10-16 DIAGNOSIS — I447 Left bundle-branch block, unspecified: Secondary | ICD-10-CM

## 2016-10-16 DIAGNOSIS — Z91013 Allergy to seafood: Secondary | ICD-10-CM | POA: Diagnosis not present

## 2016-10-16 DIAGNOSIS — I110 Hypertensive heart disease with heart failure: Secondary | ICD-10-CM | POA: Insufficient documentation

## 2016-10-16 DIAGNOSIS — I5022 Chronic systolic (congestive) heart failure: Secondary | ICD-10-CM

## 2016-10-16 DIAGNOSIS — Z833 Family history of diabetes mellitus: Secondary | ICD-10-CM | POA: Insufficient documentation

## 2016-10-16 DIAGNOSIS — G4733 Obstructive sleep apnea (adult) (pediatric): Secondary | ICD-10-CM | POA: Insufficient documentation

## 2016-10-16 DIAGNOSIS — K219 Gastro-esophageal reflux disease without esophagitis: Secondary | ICD-10-CM | POA: Insufficient documentation

## 2016-10-16 DIAGNOSIS — Z803 Family history of malignant neoplasm of breast: Secondary | ICD-10-CM | POA: Insufficient documentation

## 2016-10-16 DIAGNOSIS — I429 Cardiomyopathy, unspecified: Secondary | ICD-10-CM | POA: Insufficient documentation

## 2016-10-16 LAB — ECHOCARDIOGRAM COMPLETE
AVLVOTPG: 2 mmHg
CHL CUP MV DEC (S): 246
E/e' ratio: 16.38
EWDT: 246 ms
FS: 7 % — AB (ref 28–44)
IVS/LV PW RATIO, ED: 0.84
LA ID, A-P, ES: 52 mm
LA diam end sys: 52 mm
LA diam index: 2.71 cm/m2
LA vol A4C: 59.7 ml
LA vol: 66.5 mL
LAVOLIN: 34.6 mL/m2
LDCA: 2.84 cm2
LV E/e' medial: 16.38
LV dias vol index: 102 mL/m2
LV dias vol: 195 mL — AB (ref 46–106)
LV sys vol: 150 mL — AB (ref 14–42)
LVEEAVG: 16.38
LVOT SV: 37 mL
LVOT VTI: 13.2 cm
LVOT diameter: 19 mm
LVOTPV: 70.5 cm/s
LVSYSVOLIN: 78 mL/m2
MV pk A vel: 71.3 m/s
MVPKEVEL: 62.4 m/s
PW: 9.56 mm — AB (ref 0.6–1.1)
RV LATERAL S' VELOCITY: 11.7 cm/s
RV TAPSE: 17.4 mm
Simpson's disk: 23
Stroke v: 45 ml
TDI e' medial: 3.81

## 2016-10-16 NOTE — Progress Notes (Signed)
Patient ID: Susan Peck, female   DOB: 03/22/1966, 51 y.o.   MRN: [id number]   Advanced Heart Failure Clinic Note   PCP: Dr Rae Mar GYN: Dr Carolin Guernsey Pulmonologist: Dr Gwenette Greet HF: Dr. Haroldine Laws   HPI:  Susan Peck is a 51 year old African American female with systolic heart failure, HTN, GERD, 11/23/11 S/P Hysterectomy, multinodular goiter and asthma.   2004 cardiac cath by Dr Lynnell Jude in [place], [place] with one blockage noted - no stent. EF said to be normal. About 1 year ago at [facility] noted murmur and she was referred to Dr Tamala Julian EF 25-30%. More recently, she has been followed in CHF clinic.   She returns today for HF follow up. She was seen recently in the ED for chest pain, felt to be GI related and therefore EGD is scheduled for 11/10/16. Feels well today, does get SOB with the steps in her home. Delene Loll was increased to 97/103 mg in January and she has noticed a difference- feels much better. Does not feel SOB when she is walking through the grocery store.  Denies dizziness and orthostasis. Has been eating out occasionally, does not add salt to her food but had bacon and pizza yesterday. Drinks more than 2L a day, drinks mainly juice. Has not taken any extra lasix in the past 3 months. Does weigh at home - weight range 180-183 pounds. Taking her medications, but says that she will miss at least one dose a week. Uses 2-3 pillows for sleep. No PND, has not been wearing CPAP. She wants information about starting an exercise program.    Echo today EF 20-25%  04/03/2011  ECHO EF 25-30% 06/26/2012 ECHO EF 30-35%  02/29/2012 ECHO EF 35-40%  11/18/12 ECHO EF 55% Inferior wall mildly hypokinetic with septal HK Myoview 02/03/10 EF 42% No ischemia Septal and apical hypokinesis 11/14 Cardiac MRI  EF 52%- septal bounce suggestive of LBBB. Dyssynchrony from LBBB leads to the low calculated EF. Normal RV size and systolic function. No myocardial delayed enhancement, so no definite evidence for prior  myocardial infarction, myocarditis, or infiltrative disease. 1/16 CT chest - small pleural effusions with early pulmonary edema. No ILD. 09/03/2014: ECHO EF 20-25%  6/16: ECHO EF 35-40% Cardiolite (6/16): EF 36%, no ischemia/infarction.  09/07/2015: ECHO EF 30-35%   CPX 11/20/2014  Peak VO2: 16.1 (76.8% predicted peak VO2) - when corrected to ideal BW pVO2 is 22.9 VE/VCO2 slope: 28.5 OUES: 1.59 Peak RER: 1.18  LHC/RHC 09/15/2015  RA = 2 RV = 22/1/2 PA = 27/8 (19) PCW = 8 Fick cardiac output/index = 5.22/2.8 PVR = 2.1 Ao sat = 98% PA sat = 70%, 71% Assessment: 1. Normal coronary arteries 2. Normal hemodynamics  3. LVEF 35-40% with global HK due to   Labs 08/19/14: K 4.1 Creatinine 0.62   Labs 10/28/2014: K 4.3 Creatinine 0.73  Labs 11/19/2014: K 3.6 Creatinine 0.64  Labs 6/16: digoxin 0.5 Labs 7/16: HIV negative, TSH normal Lipids 8/16: TC 206, TG 133, HDL 29, LDL 150 Labs 08/18/2015: 9.1  Labs 09/07/2015: K 4.4 Creating 0.68  Lab 12/14/2015: K 3.7 Creatinine 0.71   ROS: All other systems normal except as mentioned in HPI, past medical history and problem list.    Past Medical History:  Diagnosis Date  . Anxiety    no meds  . Asthma   . Depression    no meds  . Diabetes mellitus    recent dx 11/17/11 - started med 11/18/11  . GERD (gastroesophageal reflux  disease)   . Goiter 07/27/2011   non-neoplastic goiter - fine needle aspiration - benign  . Heart murmur    dx 2 yrs ago per pt  . Herpes genitalis in women   . Hypertension   . IBS (irritable bowel syndrome)    tx with diet per pt  . Low back pain    history  . Obesity   . Seasonal allergies   . Shortness of breath    occasional - exercise induced  . Systolic heart failure    May 2011 EF 35-40%, 04/03/11 EF 25-30%, 06/27/11 EF 30-35%    Current Outpatient Prescriptions  Medication Sig Dispense Refill  . albuterol (PROAIR HFA) 108 (90 Base) MCG/ACT inhaler Inhale 2 puffs into the lungs every 6 (six) hours as  needed for wheezing or shortness of breath.    Marland Kitchen albuterol (PROVENTIL HFA;VENTOLIN HFA) 108 (90 Base) MCG/ACT inhaler Inhale 1-2 puffs into the lungs every 6 (six) hours as needed for wheezing or shortness of breath. 1 Inhaler 0  . aspirin 81 MG chewable tablet Chew 81 mg by mouth daily.     . Aspirin Effervescent (ALKA-SELTZER PO) Take 2 tablets by mouth daily as needed (indigestion/ acid reflux).    . Aspirin-Acetaminophen-Caffeine (GOODY HEADACHE PO) Take 1 packet by mouth daily as needed (pain/headache).    Marland Kitchen atorvastatin (LIPITOR) 40 MG tablet Take 1 tablet (40 mg total) by mouth daily. 30 tablet 6  . BAYER CONTOUR TEST test strip   1  . Bisacodyl (LAXATIVE PO) Take 2 tablets by mouth daily as needed (constipation).    . carvedilol (COREG) 25 MG tablet TAKE 1 TABLET BY MOUTH TWICE DAILY WITH MEALS 60 tablet 1  . Dexlansoprazole 30 MG capsule Take 30 mg by mouth daily.    Marland Kitchen dicyclomine (BENTYL) 20 MG tablet Take 1 tablet (20 mg total) by mouth 2 (two) times daily. 20 tablet 0  . DULoxetine (CYMBALTA) 30 MG capsule Take 1 capsule (30 mg total) by mouth daily. 90 capsule 2  . estradiol (ESTRACE) 1 MG tablet Take 1 tablet (1 mg total) by mouth daily. 30 tablet 11  . fluticasone (FLONASE) 50 MCG/ACT nasal spray Place 1 spray into both nostrils 2 (two) times daily as needed for allergies or rhinitis. 16 g 2  . furosemide (LASIX) 40 MG tablet Take 1 tablet (40 mg total) by mouth 2 (two) times daily. 30 tablet 3  . glimepiride (AMARYL) 2 MG tablet Take 2 mg by mouth daily with breakfast.    . levothyroxine (SYNTHROID, LEVOTHROID) 50 MCG tablet Take 1 tablet (50 mcg total) by mouth daily. 30 tablet 4  . loratadine (CLARITIN) 10 MG tablet Take 1 tablet (10 mg total) by mouth daily. 30 tablet 2  . omeprazole (PRILOSEC) 40 MG capsule Take 1 capsule (40 mg total) by mouth 2 (two) times daily. 60 capsule 1  . OVER THE COUNTER MEDICATION Place 1 spray into both nostrils daily as needed (congestion). Generic  Vicks twisthaler    . Polyethyl Glycol-Propyl Glycol (SYSTANE) 0.4-0.3 % SOLN Place 1 drop into both eyes 2 (two) times daily as needed (dry eyes).     Marland Kitchen PRESCRIPTION MEDICATION Inhale into the lungs at bedtime. CPAP    . ranitidine (ZANTAC) 150 MG tablet Take 1 tablet (150 mg total) by mouth 2 (two) times daily. 60 tablet 1  . sacubitril-valsartan (ENTRESTO) 97-103 MG Take 1 tablet by mouth 2 (two) times daily. 60 tablet 3  . sodium chloride (OCEAN) 0.65 %  SOLN nasal spray Place 2 sprays into both nostrils daily as needed for congestion.    . sucralfate (CARAFATE) 1 GM/10ML suspension Take 10 mLs (1 g total) by mouth 4 (four) times daily -  with meals and at bedtime. 420 mL 0  . benzonatate (TESSALON PERLES) 100 MG capsule Take 1 capsule (100 mg total) by mouth every 6 (six) hours as needed for cough. (Patient not taking: Reported on 10/03/2016) 30 capsule 1  . chlorpheniramine-HYDROcodone (TUSSIONEX PENNKINETIC ER) 10-8 MG/5ML SUER Take 5 mLs by mouth every 12 (twelve) hours as needed for cough. (Patient not taking: Reported on 08/18/2016) 115 mL 0  . DM-Phenylephrine-Acetaminophen (ALKA-SELTZER PLS SINUS & COUGH PO) Take 2 tablets by mouth daily as needed (indigestion).    Marland Kitchen guaiFENesin-codeine 100-10 MG/5ML syrup Take 5 mLs by mouth every 6 (six) hours as needed for cough. For no more than 1 week (Patient not taking: Reported on 10/16/2016) 120 mL 0   No current facility-administered medications for this encounter.    Facility-Administered Medications Ordered in Other Encounters  Medication Dose Route Frequency Provider Last Rate Last Dose  . aminophylline injection 150 mg  150 mg Intravenous BID PRN Larey Dresser, MD   150 mg at 12/24/14 1005     Allergies  Allergen Reactions  . Shrimp [Shellfish Allergy] Anaphylaxis    Social History   Social History  . Marital status: Legally Separated    Spouse name: N/A  . Number of children: Y  . Years of education: N/A   Occupational  History  . 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 Smoking status: Never Smoker  . Smokeless tobacco: Never Used  . Alcohol use No  . Drug use: No  . Sexual activity: No   Other Topics Concern  . Not on file   Social History Narrative   She lives with her husband and son.  She is works for [employer].    Family History  Problem Relation Age of Onset  . Heart disease Maternal Aunt   . Breast cancer Mother   . Thyroid disease Mother   . Hypertension Maternal Grandmother   . Diabetes Maternal Grandmother   . Breast cancer Maternal Aunt   . Heart disease Maternal Aunt   . Diabetes Maternal Grandfather   . Diabetes Paternal Grandmother   . Diabetes Paternal Grandfather   . Hypertension Paternal Grandfather     PHYSICAL EXAM: Vitals:   10/16/16 0957  BP: 110/70  Pulse: 70  SpO2: 100%  Weight: 181 lb 4 oz (82.2 kg)   Wt Readings from Last 3 Encounters:  10/16/16 181 lb 4 oz (82.2 kg)  09/01/16 190 lb 8 oz (86.4 kg)  08/18/16 187 lb 3.2 oz (84.9 kg)   General:  Well appearing Female, NAD.  HEENT:  Normal, atraumatic, anicteric Neck: supple. No JVD  Carotids 2+ bilat; no bruits.  Cor: PMI laterally displaced. RRR. Spilt 2nd heart sound. No palpable S3.  Lungs: Clear to auscultation in bilateral lower lobes. No wheeze  Abdomen: Obese. soft, NT, ND, no HSM. No bruits or masses. Good bowel sounds in all quadrants.  Extremities: no cyanosis, clubbing, rash, no edema Neuro: alert & oriented x 3, cranial nerves grossly intact. moves all 4 extremities w/o difficulty. Affect pleasant.   ASSESSMENT & PLAN: 1. Chronic systolic HF: Nonischemic cardiomyopathy.  08/2015 ECHO per Dr Haroldine Laws EF 35-40% (similar to cMRI).  - Echo today reviewed personally by  Dr. Haroldine Laws  EF 20-25% - NYHA class II symptoms. - Volume status stable on exam. Continue Lasix 40mg  BID.    - On goal dose of carvedilol 25 bid. - She has previously been intolerant of  Bidil with headache.  - Continue Entresto 97/103 mg BID.  - Intolerant to spiro with painful gynecomastia.  - EKG with LBBB, will refer to EP for consideration of CRT-D.  - refer to cardiac rehab.  2. Chest pressure - No echest pain. LHC 08/2015 with normal cors.   3. HTN:  - Stable on current regimen.  4. OSA:  - not wearing CPAP  5. Multinodular goiter:     - Follows with endocrinology.  6. Hyperlipidemia:  - Continue atorvastatin 40 mg daily.   - LDL 115 in 10/2015.  - Reinforced diet restrictions.  7. DM II: followed by  Endocrinologist. She is being followed at [facility] for assistance with diabetes management.  8. Acid reflux: Plan for EGD on 11/10/16.   Follow up in 3 months.   Arbutus Leas, NP  10/16/2016  Patient seen and examined with the above-signed Advanced Practice Provider and/or Housestaff. I personally reviewed laboratory data, imaging studies and relevant notes. I independently examined the patient and formulated the important aspects of the plan. I have edited the note to reflect any of my changes or salient points. I have personally discussed the plan with the patient and/or family.  Overall doing well NYHA II. Echo reviewed personally. EF 20-25% in setting of LBBB. Will refer to EF for CRT-D. Continue current HF regimen. Refer to cardiac rehab. Labs today.   Glori Bickers, MD  11:22 PM

## 2016-10-16 NOTE — Patient Instructions (Signed)
You have been referred to Cardiac Rehab, they will call you to schedule  You have been referred to EP to discuss an ICD (defibrillator)  Your physician recommends that you schedule a follow-up appointment in: 3 months   Cardioverter Defibrillator Implantation An implantable cardioverter defibrillator (ICD) is a small device that is placed under the skin in the chest or abdomen. An ICD consists of a battery, a small computer (pulse generator), and wires (leads) that go into the heart. An ICD is used to detect and correct two types of dangerous irregular heartbeats (arrhythmias):  A rapid heart rhythm (tachycardia).  An arrhythmia in which the lower chambers of the heart (ventricles) contract in an uncoordinated way (fibrillation). When an ICD detects tachycardia, it sends a low-energy shock to the heart to restore the heartbeat to normal (cardioversion). This signal is usually painless. If cardioversion does not work or if the ICD detects fibrillation, it delivers a high-energy shock to the heart (defibrillation) to restart the heart. This shock may feel like a strong jolt in the chest. Your health care provider may prescribe an ICD if:  You have had an arrhythmia that originated in the ventricles.  Your heart has been damaged by a disease or heart condition. Sometimes, ICDs are programmed to act as a device called a pacemaker. Pacemakers can be used to treat a slow heartbeat (bradycardia) or tachycardia by taking over the heart rate with electrical impulses. Tell a health care provider about:  Any allergies you have.  All medicines you are taking, including vitamins, herbs, eye drops, creams, and over-the-counter medicines.  Any problems you or family members have had with anesthetic medicines.  Any blood disorders you have.  Any surgeries you have had.  Any medical conditions you have.  Whether you are pregnant or may be pregnant. What are the risks? Generally, this is a safe  procedure. However, problems may occur, including:  Swelling, bleeding, or bruising.  Infection.  Blood clots.  Damage to other structures or organs, such as nerves, blood vessels, or the heart.  Allergic reactions to medicines used during the procedure. What happens before the procedure? Staying hydrated  Follow instructions from your health care provider about hydration, which may include:  Up to 2 hours before the procedure - you may continue to drink clear liquids, such as water, clear fruit juice, black coffee, and plain tea. Eating and drinking restrictions  Follow instructions from your health care provider about eating and drinking, which may include:  8 hours before the procedure - stop eating heavy meals or foods such as meat, fried foods, or fatty foods.  6 hours before the procedure - stop eating light meals or foods, such as toast or cereal.  6 hours before the procedure - stop drinking milk or drinks that contain milk.  2 hours before the procedure - stop drinking clear liquids. Medicine  Ask your health care provider about:  Changing or stopping your normal medicines. This is important if you take diabetes medicines or blood thinners.  Taking medicines such as aspirin and ibuprofen. These medicines can thin your blood. Do not take these medicines before your procedure if your doctor tells you not to. Tests   You may have blood tests.  You may have a test to check the electrical signals in your heart (electrocardiogram, ECG).  You may have imaging tests, such as a chest X-ray. General instructions   For 24 hours before the procedure, stop using products that contain nicotine or tobacco,  such as cigarettes and e-cigarettes. If you need help quitting, ask your health care provider.  Plan to have someone take you home from the hospital or clinic.  You may be asked to shower with a germ-killing soap. What happens during the procedure?  To reduce your risk of  infection:  Your health care team will wash or sanitize their hands.  Your skin will be washed with soap.  Hair may be removed from the surgical area.  Small monitors will be put on your body. They will be used to check your heart, blood pressure, and oxygen level.  An IV tube will be inserted into one of your veins.  You will be given one or more of the following:  A medicine to help you relax (sedative).  A medicine to numb the area (local anesthetic).  A medicine to make you fall asleep (general anesthetic).  Leads will be guided through a blood vessel into your heart and attached to your heart muscles. Depending on the ICD, the leads may go into one ventricle or they may go into both ventricles and into an upper chamber of the heart. An X-ray machine (fluoroscope) will be usedto help guide the leads.  A small incision will be made to create a deep pocket under your skin.  The pulse generator will be placed into the pocket.  The ICD will be tested.  The incision will be closed with stitches (sutures), skin glue, or staples.  A bandage (dressing) will be placed over the incision. This procedure may vary among health care providers and hospitals. What happens after the procedure?  Your blood pressure, heart rate, breathing rate, and blood oxygen level will be monitored often until the medicines you were given have worn off.  A chest X-ray will be taken to check that the ICD is in the right place.  You will need to stay in the hospital for 1-2 days so your health care provider can make sure your ICD is working.  Do not drive for 24 hours if you received a sedative. Ask your health care provider when it is safe for you to drive.  You may be given an identification card explaining that you have an ICD. Summary  An implantable cardioverter defibrillator (ICD) is a small device that is placed under the skin in the chest or abdomen. It is used to detect and correct dangerous  irregular heartbeats (arrhythmias).  An ICD consists of a battery, a small computer (pulse generator), and wires (leads) that go into the heart.  When an ICD detects rapid heart rhythm (tachycardia), it sends a low-energy shock to the heart to restore the heartbeat to normal (cardioversion). If cardioversion does not work or if the ICD detects uncoordinated heart contractions (fibrillation), it delivers a high-energy shock to the heart (defibrillation) to restart the heart.  You will need to stay in the hospital for 1-2 days to make sure your ICD is working. This information is not intended to replace advice given to you by your health care provider. Make sure you discuss any questions you have with your health care provider. Document Released: 04/01/2002 Document Revised: 07/19/2016 Document Reviewed: 07/19/2016 Elsevier Interactive Patient Education  2017 [employer].

## 2016-10-16 NOTE — Progress Notes (Signed)
Echocardiogram 2D Echocardiogram has been performed.  Aggie Cosier 10/16/2016, 9:48 AM

## 2016-10-20 ENCOUNTER — Institutional Professional Consult (permissible substitution): Payer: BLUE CROSS/BLUE SHIELD | Admitting: Internal Medicine

## 2016-10-20 ENCOUNTER — Encounter (HOSPITAL_COMMUNITY): Payer: Self-pay

## 2016-10-23 ENCOUNTER — Encounter: Payer: Self-pay | Admitting: Internal Medicine

## 2016-10-27 ENCOUNTER — Telehealth (HOSPITAL_COMMUNITY): Payer: Self-pay | Admitting: *Deleted

## 2016-10-27 ENCOUNTER — Encounter (HOSPITAL_COMMUNITY): Payer: Self-pay | Admitting: *Deleted

## 2016-10-27 NOTE — Telephone Encounter (Signed)
Opened in error

## 2016-10-27 NOTE — Progress Notes (Signed)
Received letter from [facility] requesting medical records for patient from July 9th, 2016 to present time. Request was faxed to our office on 10/24/16.  All requested medical records were faxed today to [phone].  Original request will be sent to be scanned to patient's electronic medical record.

## 2016-11-03 ENCOUNTER — Other Ambulatory Visit: Payer: Self-pay

## 2016-11-03 ENCOUNTER — Telehealth (HOSPITAL_COMMUNITY): Payer: Self-pay | Admitting: Cardiovascular Disease

## 2016-11-03 NOTE — Telephone Encounter (Signed)
Patient insurance is active and benefits verified. Patient has BCBS - no co-payment, deductible $800.00/$800.00 has been met, out of pocket $2450.00/$2182.20 has been met, co-insurance 50% tier 2/30% tier 1, no pre-authorization and no limit on visit. Passport/reference [phone].

## 2016-11-05 MED ORDER — OMEPRAZOLE 40 MG PO CPDR: 40.0000 mg | DELAYED_RELEASE_CAPSULE | Freq: Two times a day (BID) | ORAL | 1 refills | Status: DC

## 2016-11-05 MED ORDER — RANITIDINE HCL 150 MG PO TABS: 150.0000 mg | ORAL_TABLET | Freq: Two times a day (BID) | ORAL | 3 refills | Status: DC

## 2016-11-06 ENCOUNTER — Telehealth (HOSPITAL_COMMUNITY): Payer: Self-pay | Admitting: Cardiovascular Disease

## 2016-11-06 NOTE — Telephone Encounter (Signed)
I called and spoke to patient about scheduling and participation in the cardiac rehab program. I went over insurance information with patient and gave her an overview of the program. Patient asked that I send her a brochure to review before proceeding. I gave patient my contact information and mailed her a brochure.

## 2016-11-07 ENCOUNTER — Other Ambulatory Visit (HOSPITAL_COMMUNITY): Payer: Self-pay | Admitting: Adult Health

## 2016-11-07 ENCOUNTER — Ambulatory Visit (INDEPENDENT_AMBULATORY_CARE_PROVIDER_SITE_OTHER): Payer: BLUE CROSS/BLUE SHIELD | Admitting: Internal Medicine

## 2016-11-07 ENCOUNTER — Other Ambulatory Visit (HOSPITAL_COMMUNITY): Payer: Self-pay | Admitting: *Deleted

## 2016-11-07 ENCOUNTER — Encounter: Payer: Self-pay | Admitting: *Deleted

## 2016-11-07 ENCOUNTER — Other Ambulatory Visit: Payer: Self-pay | Admitting: *Deleted

## 2016-11-07 ENCOUNTER — Encounter: Payer: Self-pay | Admitting: Internal Medicine

## 2016-11-07 VITALS — BP 134/90 | HR 87 | Ht 66.0 in | Wt 182.0 lb

## 2016-11-07 DIAGNOSIS — I5022 Chronic systolic (congestive) heart failure: Secondary | ICD-10-CM

## 2016-11-07 DIAGNOSIS — Z01812 Encounter for preprocedural laboratory examination: Secondary | ICD-10-CM

## 2016-11-07 MED ORDER — CARVEDILOL 25 MG PO TABS: 25.0000 mg | ORAL_TABLET | Freq: Two times a day (BID) | ORAL | 11 refills | Status: DC

## 2016-11-07 NOTE — Patient Instructions (Addendum)
Medication Instructions: - Your physician recommends that you continue on your current medications as directed. Please refer to the Current Medication list given to you today.  Labwork: - Your physician recommends that you return for lab work: Monday 12/04/16 - BMP/CBC/INR  Procedures/Testing: - Your physician has recommended that you have a defibrillator (Bi-V/ CRT device) inserted. An implantable cardioverter defibrillator (ICD) is a small device that is placed in your chest or, in rare cases, your abdomen. This device uses electrical pulses or shocks to help control life-threatening, irregular heartbeats that could lead the heart to suddenly stop beating (sudden cardiac arrest). Leads are attached to the ICD that goes into your heart. This is done in the hospital and usually requires an overnight stay. Please see the instruction sheet given to you today for more information.  - CRT or cardiac resynchronization therapy is a treatment used to correct heart failure. When you have heart failure your heart is weakened and doesn't pump as well as it should. This therapy may help reduce symptoms and improve the quality of life.  Please see the handout/brochure given to you today to get more information of the different options of therapy.   Follow-Up: - Your physician recommends that you schedule a follow-up appointment in: about 14 days (from 12/13/16) for a wound check visit with the [facility].    Any Additional Special Instructions Will Be Listed Below (If Applicable).     If you need a refill on your cardiac medications before your next appointment, please call your pharmacy.

## 2016-11-07 NOTE — Progress Notes (Signed)
Susan Peck, MRN: [id number], DOB/AGE: August 13, 1965 51 y.o. Admit date: (Not on file) Date of Consult: 11/07/2016  Primary Physician: Macon Large, MD Primary Cardiologist: CHF DB   Susan Peck is being seen today for the evaluation of icd  at the request of  Bensimhon, Shaune Pascal, MD.   HPI Susan Peck is a 51 y.o. female  Referred for consideration of an ICD   She has a long-standing history of left ventricular dysfunction and symptoms of congestive heart failure  She is improved somewhat since the introduction of Entresto but is still short of breath at 100 feet and less than a flight of stairs. Nocturnal dyspnea and peripheral edema are much improved proved.  She has chronic 3 pillow orthopnea.  She's trying to adjust to her CPAP.  She denies tachycardia palpitations or syncope.   DATE TEST    9/12 Echo EF 25-30%   2/17    Cath   EF 35 % Normal CA  2/17    Echo   EF 30-35 %   3/*18 Echo EF 20-25%      Past Medical History:  Diagnosis Date  . Anxiety    no meds  . Asthma   . Depression    no meds  . Diabetes mellitus    recent dx 11/17/11 - started med 11/18/11  . GERD (gastroesophageal reflux disease)   . Goiter 07/27/2011   non-neoplastic goiter - fine needle aspiration - benign  . Heart murmur    dx 2 yrs ago per pt  . Herpes genitalis in women   . Hypertension   . IBS (irritable bowel syndrome)    tx with diet per pt  . Low back pain    history  . Obesity   . Seasonal allergies   . Shortness of breath    occasional - exercise induced  . Systolic heart failure    May 2011 EF 35-40%, 04/03/11 EF 25-30%, 06/27/11 EF 30-35%      Surgical History:  Past Surgical History:  Procedure Laterality Date  . ABDOMINAL HYSTERECTOMY    . cardiac catherization  2004   [place], [place] - Dr Lynnell Jude  . CARDIAC CATHETERIZATION    . CARDIAC CATHETERIZATION N/A 09/15/2015   Procedure: Right/Left Heart Cath and  Coronary Angiography;  Surgeon: Jolaine Artist, MD;  Location: [facility];  Service: Cardiovascular;  Laterality: N/A;  . COLONOSCOPY WITH PROPOFOL N/A 03/26/2015   Procedure: COLONOSCOPY WITH PROPOFOL;  Surgeon: Carol Ada, MD;  Location: [facility];  Service: Endoscopy;  Laterality: N/A;  . colonscopy    . CYSTOSCOPY  11/23/2011   Procedure: CYSTOSCOPY;  Surgeon: Jolayne Haines, MD;  Location: [facility];  Service: Gynecology;  Laterality: N/A;  . DIAGNOSTIC LAPAROSCOPY     of pelvis  . DILATION AND CURETTAGE OF UTERUS  12/2006,  10/2005   hysteroscopy surgery x 2  . LAPAROSCOPIC ASSISTED VAGINAL HYSTERECTOMY  11/23/2011   Procedure: LAPAROSCOPIC ASSISTED VAGINAL HYSTERECTOMY;  Surgeon: Jolayne Haines, MD;  Location: [facility];  Service: Gynecology;  Laterality: N/A;  . NOVASURE ABLATION     10/2005  . SALPINGOOPHORECTOMY  11/23/2011   Procedure: SALPINGO OOPHERECTOMY;  Surgeon: Jolayne Haines, MD;  Location: [facility];  Service: Gynecology;  Laterality: Bilateral;  . SVD     x 3  . TUBAL LIGATION    . [place]  Meds: Prior to Admission medications   Medication Sig Start Date End Date Taking? Authorizing Provider  albuterol (PROVENTIL HFA;VENTOLIN HFA) 108 (90 Base) MCG/ACT inhaler Inhale 1-2 puffs into the lungs every 6 (six) hours as needed for wheezing or shortness of breath. 08/23/16   Burgess Estelle, MD  aspirin 81 MG chewable tablet Chew 81 mg by mouth daily.     Historical Provider, MD  Aspirin-Acetaminophen-Caffeine (GOODY HEADACHE PO) Take 1 packet by mouth daily as needed (pain/headache).    Historical Provider, MD  atorvastatin (LIPITOR) 40 MG tablet Take 1 tablet (40 mg total) by mouth daily. 06/01/15   Jolaine Artist, MD  BAYER CONTOUR TEST test strip  11/03/15   Historical Provider, MD  benzonatate (TESSALON PERLES) 100 MG capsule Take 1 capsule (100 mg total) by mouth every 6 (six) hours as needed for cough. Patient not taking: Reported on 10/03/2016  08/23/16 08/23/17  Burgess Estelle, MD  carvedilol (COREG) 25 MG tablet Take 1 tablet (25 mg total) by mouth 2 (two) times daily with a meal. 11/07/16   Jolaine Artist, MD  chlorpheniramine-HYDROcodone [facility] PENNKINETIC ER) 10-8 MG/5ML SUER Take 5 mLs by mouth every 12 (twelve) hours as needed for cough. Patient not taking: Reported on 08/18/2016 06/26/16   Robyn Haber, MD  dicyclomine (BENTYL) 20 MG tablet Take 1 tablet (20 mg total) by mouth 2 (two) times daily. Patient not taking: Reported on 11/07/2016 10/03/16   Margette Fast, MD  DULoxetine (CYMBALTA) 30 MG capsule Take 1 capsule (30 mg total) by mouth daily. Patient not taking: Reported on 11/07/2016 04/26/16   Elayne Snare, MD  estradiol (ESTRACE) 1 MG tablet Take 1 tablet (1 mg total) by mouth daily. 08/12/15   Terrance Mass, MD  fluticasone (FLONASE) 50 MCG/ACT nasal spray Place 1 spray into both nostrils 2 (two) times daily as needed for allergies or rhinitis. 09/27/16   Bethany Molt, DO  furosemide (LASIX) 40 MG tablet Take 1 tablet (40 mg total) by mouth 2 (two) times daily. 08/07/16   Amy D Clegg, NP  glimepiride (AMARYL) 2 MG tablet Take 2 mg by mouth daily with breakfast.    Historical Provider, MD  guaiFENesin-codeine 100-10 MG/5ML syrup Take 5 mLs by mouth every 6 (six) hours as needed for cough. For no more than 1 week Patient not taking: Reported on 10/16/2016 08/23/16   Burgess Estelle, MD  levothyroxine (SYNTHROID, LEVOTHROID) 50 MCG tablet Take 1 tablet (50 mcg total) by mouth daily. 03/17/16   Elayne Snare, MD  loratadine (CLARITIN) 10 MG tablet Take 1 tablet (10 mg total) by mouth daily. 08/23/16 08/23/17  Burgess Estelle, MD  omeprazole (PRILOSEC) 40 MG capsule Take 1 capsule (40 mg total) by mouth 2 (two) times daily. 11/05/16 11/05/17  Bethany Molt, DO  OVER THE COUNTER MEDICATION Place 1 spray into both nostrils daily as needed (congestion). Generic Vicks twisthaler    Historical Provider, MD  Polyethyl Glycol-Propyl Glycol (SYSTANE)  0.4-0.3 % SOLN Place 1 drop into both eyes 2 (two) times daily as needed (dry eyes).     Historical Provider, MD  PRESCRIPTION MEDICATION Inhale into the lungs at bedtime. CPAP    Historical Provider, MD  ranitidine (ZANTAC) 150 MG tablet Take 1 tablet (150 mg total) by mouth 2 (two) times daily. 11/05/16 11/05/17  Bethany Molt, DO  sacubitril-valsartan (ENTRESTO) 97-103 MG Take 1 tablet by mouth 2 (two) times daily. 08/18/16   Shirley Friar, PA-C  sodium chloride (OCEAN) 0.65 % SOLN  nasal spray Place 2 sprays into both nostrils daily as needed for congestion.    Historical Provider, MD  sucralfate (CARAFATE) 1 GM/10ML suspension Take 10 mLs (1 g total) by mouth 4 (four) times daily -  with meals and at bedtime. Patient not taking: Reported on 11/07/2016 10/03/16   Margette Fast, MD    Allergies:  Allergies  Allergen Reactions  . Shrimp [Shellfish Allergy] Anaphylaxis    Social History   Social History  . Marital status: Legally Separated    Spouse name: N/A  . Number of children: Y  . Years of education: N/A   Occupational History  . 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 Smoking status: Never Smoker  . Smokeless tobacco: Never Used  . Alcohol use No  . Drug use: No  . Sexual activity: No   Other Topics Concern  . Not on file   Social History Narrative   She lives with her husband and son.  She is works for [employer].     Family History  Problem Relation Age of Onset  . Heart disease Maternal Aunt   . Breast cancer Mother   . Thyroid disease Mother   . Hypertension Maternal Grandmother   . Diabetes Maternal Grandmother   . Breast cancer Maternal Aunt   . Heart disease Maternal Aunt   . Diabetes Maternal Grandfather   . Diabetes Paternal Grandmother   . Diabetes Paternal Grandfather   . Hypertension Paternal Grandfather      ROS:  Please see the history of present illness.     All other systems reviewed and negative.     Physical Exam:  Blood pressure 134/90, pulse 87, height 5\' 6"  (1.676 m), weight 182 lb (82.6 kg), SpO2 98 %. General: Well developed, obese female in no acute distress. Head: Normocephalic, atraumatic, sclera non-icteric, no xanthomas, nares are without discharge. EENT: normal  Lymph Nodes:  none Neck: Negative for carotid bruits. JVD not elevated. Back:without scoliosis kyphosis  Lungs: Clear bilaterally to auscultation without wheezes, rales, or rhonchi. Breathing is unlabored. Heart: RRR with S1 S2.  2/6 systolic  murmur . No rubs, or gallops appreciated. Abdomen: Soft, non-tender, non-distended with normoactive bowel sounds. No hepatomegaly. No rebound/guarding. No obvious abdominal masses. Msk:  Strength and tone appear normal for age. Extremities: No clubbing or cyanosis. No  edema.  Distal pedal pulses are 2+ and equal bilaterally. Skin: Warm and Dry Neuro: Alert and oriented X 3. CN III-XII intact Grossly normal sensory and motor function . Psych:  Responds to questions appropriately with a normal affect.      Labs: Cardiac Enzymes No results for input(s): CKTOTAL, CKMB, TROPONINI in the last 72 hours. CBC Lab Results  Component Value Date   WBC 6.6 10/03/2016   HGB 12.3 10/03/2016   HCT 36.2 10/03/2016   MCV 78.4 10/03/2016   PLT 246 10/03/2016   PROTIME: No results for input(s): LABPROT, INR in the last 72 hours. Chemistry No results for input(s): NA, K, CL, CO2, BUN, CREATININE, CALCIUM, PROT, BILITOT, ALKPHOS, ALT, AST, GLUCOSE in the last 168 hours.  Invalid input(s): LABALBU Lipids Lab Results  Component Value Date   CHOL 192 11/09/2015   HDL 39.50 11/09/2015   LDLCALC 115 (H) 11/09/2015   TRIG 189.0 (H) 11/09/2015   BNP Pro B Natriuretic peptide (BNP)  Date/Time Value Ref Range Status  08/19/2014 10:58 AM 316.0 (H) 0.0 - 100.0 pg/mL Final  06/21/2014 10:22 AM 744.9 (H) 0 - 125 pg/mL Final  04/03/2011 04:21 AM 182.8 (H) 0 - 125 pg/mL Final   04/01/2011 05:30 PM 564.3 (H) 0 - 125 pg/mL Final   Thyroid Function Tests: No results for input(s): TSH, T4TOTAL, T3FREE, THYROIDAB in the last 72 hours.  Invalid input(s): FREET3 Miscellaneous Lab Results  Component Value Date   DDIMER 0.48 04/01/2011    Radiology/Studies:  No results found.  EKG: Sinus is 72 Intervals 16/16/44 Left axis -49   Assessment and Plan:  Nonischemic cardiomyopathy  Congestive heart failure-chronic-systolic-class III  Probable obstructive sleep apnea  Left bundle branch block   The patient has persistent nonischemic cardiomyopathy with congestive heart failure left branch block. She is young. It is reasonable to consider ICD implantation for primary prevention in the context of the [place] Trial.  With her congestive heart failure and brought left bundle branch block, particularly given gender , it is appropriate to pursue CRT at the same time as ICD implantation.  She is on guideline directed medical therapy. She remains quite anxious. We have discussed alternatives and the potential benefits of device implantation to reduce the risk of sudden death and to mitigate heart failure symptoms.  Have reviewed the potential benefits and risks of ICD implantation including but not limited to death, perforation of heart or lung, lead dislodgement, infection,  device malfunction and inappropriate shocks.  The patient and family express understanding  and are willing to proceed.          Virl Axe

## 2016-11-08 ENCOUNTER — Encounter (HOSPITAL_COMMUNITY): Payer: Self-pay | Admitting: *Deleted

## 2016-11-08 MED ORDER — FLUTICASONE PROPIONATE 50 MCG/ACT NA SUSP: 1.0000 | Freq: Two times a day (BID) | NASAL | 3 refills | Status: DC | PRN

## 2016-11-09 ENCOUNTER — Encounter (HOSPITAL_COMMUNITY): Payer: Self-pay | Admitting: *Deleted

## 2016-11-09 ENCOUNTER — Encounter (HOSPITAL_COMMUNITY): Payer: Self-pay | Admitting: Anesthesiology

## 2016-11-09 NOTE — Progress Notes (Signed)
Spoke with dr singer anesthesia at 1200 pm 11-09-16 and made aware patient medical history and will have icd placed 12-13-16 [facility]. [facility] needs to have procedure done [facility] singer. Left message for ashley at dr hung offcie patient must be moved to [facility] for procedure.

## 2016-11-10 ENCOUNTER — Ambulatory Visit (HOSPITAL_COMMUNITY)
Admission: RE | Admit: 2016-11-10 | Payer: BLUE CROSS/BLUE SHIELD | Source: Ambulatory Visit | Admitting: Gastroenterology

## 2016-11-10 ENCOUNTER — Telehealth (HOSPITAL_COMMUNITY): Payer: Self-pay | Admitting: *Deleted

## 2016-11-10 HISTORY — DX: Heart failure, unspecified: I50.9

## 2016-11-10 SURGERY — ESOPHAGOGASTRODUODENOSCOPY (EGD) WITH PROPOFOL
Anesthesia: Monitor Anesthesia Care

## 2016-11-10 NOTE — Telephone Encounter (Signed)
Pt left VM requesting a call back.  Called pt back no answer and VM full

## 2016-11-14 ENCOUNTER — Telehealth (HOSPITAL_COMMUNITY): Payer: Self-pay | Admitting: Cardiovascular Disease

## 2016-11-14 NOTE — Telephone Encounter (Signed)
I called and left message on patient voicemail to call office about scheduling for cardiac rehab. I wanted to also follow up with patient, I had mailed patient a brochure and wanted to see if patient had reviewed and decided on program.

## 2016-11-20 ENCOUNTER — Encounter (HOSPITAL_COMMUNITY): Payer: Self-pay | Admitting: *Deleted

## 2016-11-20 NOTE — Progress Notes (Signed)
Received medical record request from [facility] on November 14, 2016 asking for all office visit notes from 10/17/16-present.  Last office note was 10/16/2016.  Office notes faxed today to [phone].  Original request letter will be scanned to patient's electronic medical record.

## 2016-11-21 ENCOUNTER — Telehealth (HOSPITAL_COMMUNITY): Payer: Self-pay | Admitting: Cardiovascular Disease

## 2016-11-21 NOTE — Telephone Encounter (Signed)
I called and left message on patient voicemail to call office about scheduling for cardiac rehab. I left my contact information on patient voicemail.

## 2016-11-30 DIAGNOSIS — Z0289 Encounter for other administrative examinations: Secondary | ICD-10-CM

## 2016-12-01 ENCOUNTER — Telehealth (HOSPITAL_COMMUNITY): Payer: Self-pay

## 2016-12-01 NOTE — Telephone Encounter (Signed)
I returned patient call. Patient contacted me to inform me that she is scheduled to get a defibrillator on 12/13/16. Patient wants to wait and see how she is doing before scheduling for cardiac rehab. Patient stated that she will contact me and give me an update. I informed patient that I will reach out to her the last week of May or first week of June if I don't here from her to find out the status. Patient has my contact information. Patient verbalized understanding.

## 2016-12-04 ENCOUNTER — Other Ambulatory Visit: Payer: BLUE CROSS/BLUE SHIELD | Admitting: *Deleted

## 2016-12-04 DIAGNOSIS — Z01812 Encounter for preprocedural laboratory examination: Secondary | ICD-10-CM

## 2016-12-04 DIAGNOSIS — I5022 Chronic systolic (congestive) heart failure: Secondary | ICD-10-CM

## 2016-12-05 LAB — CBC WITH DIFFERENTIAL/PLATELET
Basophils Absolute: 0 10*3/uL (ref 0.0–0.2)
Basos: 0 %
EOS (ABSOLUTE): 0 10*3/uL (ref 0.0–0.4)
EOS: 0 %
HEMATOCRIT: 39 % (ref 34.0–46.6)
Hemoglobin: 12.8 g/dL (ref 11.1–15.9)
IMMATURE GRANS (ABS): 0 10*3/uL (ref 0.0–0.1)
IMMATURE GRANULOCYTES: 0 %
LYMPHS: 52 %
Lymphocytes Absolute: 3.8 10*3/uL — ABNORMAL HIGH (ref 0.7–3.1)
MCH: 26.7 pg (ref 26.6–33.0)
MCHC: 32.8 g/dL (ref 31.5–35.7)
MCV: 81 fL (ref 79–97)
MONOS ABS: 0.5 10*3/uL (ref 0.1–0.9)
Monocytes: 7 %
NEUTROS PCT: 41 %
Neutrophils Absolute: 3.1 10*3/uL (ref 1.4–7.0)
PLATELETS: 230 10*3/uL (ref 150–379)
RBC: 4.79 x10E6/uL (ref 3.77–5.28)
RDW: 14.4 % (ref 12.3–15.4)
WBC: 7.4 10*3/uL (ref 3.4–10.8)

## 2016-12-05 LAB — PROTIME-INR
INR: 1 (ref 0.8–1.2)
PROTHROMBIN TIME: 10.8 s (ref 9.1–12.0)

## 2016-12-05 LAB — BASIC METABOLIC PANEL
BUN/Creatinine Ratio: 28 — ABNORMAL HIGH (ref 9–23)
BUN: 19 mg/dL (ref 6–24)
CALCIUM: 9.5 mg/dL (ref 8.7–10.2)
CO2: 24 mmol/L (ref 18–29)
CREATININE: 0.67 mg/dL (ref 0.57–1.00)
Chloride: 101 mmol/L (ref 96–106)
GFR calc Af Amer: 118 mL/min/{1.73_m2} (ref >59)
GFR calc non Af Amer: 102 mL/min/{1.73_m2} (ref >59)
Glucose: 209 mg/dL — ABNORMAL HIGH (ref 65–99)
Potassium: 4.2 mmol/L (ref 3.5–5.2)
SODIUM: 140 mmol/L (ref 134–144)

## 2016-12-06 ENCOUNTER — Encounter: Payer: Self-pay | Admitting: Gynecology

## 2016-12-07 ENCOUNTER — Encounter: Payer: Self-pay | Admitting: Internal Medicine

## 2016-12-11 ENCOUNTER — Telehealth: Payer: Self-pay | Admitting: Internal Medicine

## 2016-12-11 NOTE — Telephone Encounter (Signed)
Spoke with patient regarding upcoming ICD implant. She expressed some anxiety about having something permanent in her body. We discussed the follow up plan, wound check appointment, and some mobility restrictions. I explained that she would still be able to have her mammogram and that she could sleep in a way that was comfortable for her as long as it wasn't with the ipsilateral arm above her head. She verbalized understanding. I encouraged her to write down questions as they come so they could all be answered when she is at the hospital or at her wound check appointment. She agreed.

## 2016-12-11 NOTE — Telephone Encounter (Signed)
New message    Pt is calling stating she has a couple question for RN. She is asking for a call back.

## 2016-12-11 NOTE — Telephone Encounter (Signed)
Pt is scheduled to have ICD implant 12/13/16. Pt is asking if she can have mammogram after ICD and if there is a certain position she will have to sleep after ICD implant.  Pt advised I will forward to [facility] to follow up with her.

## 2016-12-12 ENCOUNTER — Telehealth: Payer: Self-pay | Admitting: Cardiology

## 2016-12-12 NOTE — Telephone Encounter (Signed)
Pt called and stated that she has a few questions?  1. What is the exact time of the procedure and how long will the procedure last?  2. Can she have a drink a wine tonight before midnight to help calm her nerves?

## 2016-12-12 NOTE — Telephone Encounter (Signed)
I left a message on the patient's identified voice mail and answered her questions. I asked that she call back with any other questions.

## 2016-12-13 ENCOUNTER — Encounter (HOSPITAL_COMMUNITY): Payer: Self-pay | Admitting: General Practice

## 2016-12-13 ENCOUNTER — Encounter (HOSPITAL_COMMUNITY)
Admission: RE | Disposition: A | Discharge status: Home / Self Care | Payer: Self-pay | Source: Ambulatory Visit | Attending: Internal Medicine

## 2016-12-13 ENCOUNTER — Other Ambulatory Visit: Payer: Self-pay

## 2016-12-13 ENCOUNTER — Ambulatory Visit (HOSPITAL_COMMUNITY)
Admission: RE | Admit: 2016-12-13 | Discharge: 2016-12-14 | Disposition: A | Discharge status: Home / Self Care | Payer: Medicaid Other | Source: Ambulatory Visit | Attending: Internal Medicine | Admitting: Internal Medicine

## 2016-12-13 DIAGNOSIS — E119 Type 2 diabetes mellitus without complications: Secondary | ICD-10-CM | POA: Insufficient documentation

## 2016-12-13 DIAGNOSIS — Z95810 Presence of automatic (implantable) cardiac defibrillator: Secondary | ICD-10-CM

## 2016-12-13 DIAGNOSIS — I429 Cardiomyopathy, unspecified: Secondary | ICD-10-CM

## 2016-12-13 DIAGNOSIS — I110 Hypertensive heart disease with heart failure: Secondary | ICD-10-CM | POA: Diagnosis not present

## 2016-12-13 DIAGNOSIS — Z7982 Long term (current) use of aspirin: Secondary | ICD-10-CM | POA: Diagnosis not present

## 2016-12-13 DIAGNOSIS — J45909 Unspecified asthma, uncomplicated: Secondary | ICD-10-CM | POA: Insufficient documentation

## 2016-12-13 DIAGNOSIS — Z6831 Body mass index (BMI) 31.0-31.9, adult: Secondary | ICD-10-CM | POA: Insufficient documentation

## 2016-12-13 DIAGNOSIS — E669 Obesity, unspecified: Secondary | ICD-10-CM | POA: Insufficient documentation

## 2016-12-13 DIAGNOSIS — I428 Other cardiomyopathies: Secondary | ICD-10-CM | POA: Diagnosis present

## 2016-12-13 DIAGNOSIS — G4730 Sleep apnea, unspecified: Secondary | ICD-10-CM | POA: Insufficient documentation

## 2016-12-13 DIAGNOSIS — Z8249 Family history of ischemic heart disease and other diseases of the circulatory system: Secondary | ICD-10-CM | POA: Insufficient documentation

## 2016-12-13 DIAGNOSIS — I5022 Chronic systolic (congestive) heart failure: Secondary | ICD-10-CM | POA: Diagnosis not present

## 2016-12-13 DIAGNOSIS — Z91013 Allergy to seafood: Secondary | ICD-10-CM | POA: Diagnosis not present

## 2016-12-13 DIAGNOSIS — Z7951 Long term (current) use of inhaled steroids: Secondary | ICD-10-CM | POA: Diagnosis not present

## 2016-12-13 DIAGNOSIS — Z7984 Long term (current) use of oral hypoglycemic drugs: Secondary | ICD-10-CM | POA: Diagnosis not present

## 2016-12-13 DIAGNOSIS — M545 Low back pain: Secondary | ICD-10-CM | POA: Insufficient documentation

## 2016-12-13 DIAGNOSIS — I447 Left bundle-branch block, unspecified: Secondary | ICD-10-CM | POA: Diagnosis not present

## 2016-12-13 DIAGNOSIS — F329 Major depressive disorder, single episode, unspecified: Secondary | ICD-10-CM | POA: Insufficient documentation

## 2016-12-13 DIAGNOSIS — Z959 Presence of cardiac and vascular implant and graft, unspecified: Secondary | ICD-10-CM

## 2016-12-13 DIAGNOSIS — F419 Anxiety disorder, unspecified: Secondary | ICD-10-CM | POA: Diagnosis not present

## 2016-12-13 DIAGNOSIS — K219 Gastro-esophageal reflux disease without esophagitis: Secondary | ICD-10-CM | POA: Insufficient documentation

## 2016-12-13 DIAGNOSIS — K589 Irritable bowel syndrome without diarrhea: Secondary | ICD-10-CM | POA: Diagnosis not present

## 2016-12-13 HISTORY — DX: Presence of automatic (implantable) cardiac defibrillator: Z95.810

## 2016-12-13 HISTORY — PX: INSERTION OF ICD: SHX6689

## 2016-12-13 HISTORY — PX: BIV ICD INSERTION CRT-D: EP1195

## 2016-12-13 LAB — SURGICAL PCR SCREEN
MRSA, PCR: NEGATIVE
Staphylococcus aureus: NEGATIVE

## 2016-12-13 LAB — GLUCOSE, CAPILLARY: GLUCOSE-CAPILLARY: 164 mg/dL — AB (ref 65–99)

## 2016-12-13 SURGERY — BIV ICD INSERTION CRT-D

## 2016-12-13 MED ORDER — HEPARIN (PORCINE) IN NACL 2-0.9 UNIT/ML-% IJ SOLN
INTRAMUSCULAR | Status: AC | PRN
  Administered 2016-12-13: 500 mL

## 2016-12-13 MED ORDER — MIDAZOLAM HCL 5 MG/5ML IJ SOLN
INTRAMUSCULAR | Status: AC
  Filled 2016-12-13: qty 5

## 2016-12-13 MED ORDER — SODIUM CHLORIDE 0.9 % IV SOLN
INTRAVENOUS | Status: DC
  Administered 2016-12-13: 14:00:00 via INTRAVENOUS

## 2016-12-13 MED ORDER — METHYLPREDNISOLONE SODIUM SUCC 125 MG IJ SOLR
125.0000 mg | Freq: Once | INTRAMUSCULAR | Status: AC
  Administered 2016-12-13: 125 mg via INTRAVENOUS

## 2016-12-13 MED ORDER — LEVOTHYROXINE SODIUM 50 MCG PO TABS
50.0000 ug | ORAL_TABLET | Freq: Every day | ORAL | Status: DC
  Administered 2016-12-14: 50 ug via ORAL
  Filled 2016-12-13: qty 1

## 2016-12-13 MED ORDER — IOPAMIDOL (ISOVUE-370) INJECTION 76%
INTRAVENOUS | Status: AC
  Filled 2016-12-13: qty 50

## 2016-12-13 MED ORDER — FLUTICASONE PROPIONATE 50 MCG/ACT NA SUSP
1.0000 | Freq: Two times a day (BID) | NASAL | Status: DC | PRN
  Filled 2016-12-13: qty 16

## 2016-12-13 MED ORDER — ONDANSETRON HCL 4 MG/2ML IJ SOLN
4.0000 mg | Freq: Four times a day (QID) | INTRAMUSCULAR | Status: DC | PRN
  Administered 2016-12-13 – 2016-12-14 (×2): 4 mg via INTRAVENOUS
  Filled 2016-12-13 (×3): qty 2

## 2016-12-13 MED ORDER — CARVEDILOL 25 MG PO TABS
25.0000 mg | ORAL_TABLET | Freq: Two times a day (BID) | ORAL | Status: DC
  Administered 2016-12-13 – 2016-12-14 (×2): 25 mg via ORAL
  Filled 2016-12-13 (×2): qty 1

## 2016-12-13 MED ORDER — LIDOCAINE HCL (PF) 1 % IJ SOLN
INTRAMUSCULAR | Status: AC
  Filled 2016-12-13: qty 30

## 2016-12-13 MED ORDER — ASPIRIN 81 MG PO CHEW
81.0000 mg | CHEWABLE_TABLET | Freq: Every day | ORAL | Status: DC
  Administered 2016-12-14: 81 mg via ORAL
  Filled 2016-12-13: qty 1

## 2016-12-13 MED ORDER — ATORVASTATIN CALCIUM 40 MG PO TABS
40.0000 mg | ORAL_TABLET | Freq: Every day | ORAL | Status: DC
  Administered 2016-12-13 – 2016-12-14 (×2): 40 mg via ORAL
  Filled 2016-12-13 (×2): qty 1

## 2016-12-13 MED ORDER — FENTANYL CITRATE (PF) 100 MCG/2ML IJ SOLN
INTRAMUSCULAR | Status: AC
  Filled 2016-12-13: qty 2

## 2016-12-13 MED ORDER — FUROSEMIDE 40 MG PO TABS
40.0000 mg | ORAL_TABLET | Freq: Two times a day (BID) | ORAL | Status: DC
  Administered 2016-12-13 – 2016-12-14 (×2): 40 mg via ORAL
  Filled 2016-12-13 (×2): qty 1

## 2016-12-13 MED ORDER — HYDROCORTISONE NA SUCCINATE PF 250 MG IJ SOLR: 200.0000 mg | Freq: Once | INTRAMUSCULAR | Status: DC

## 2016-12-13 MED ORDER — FENTANYL CITRATE (PF) 100 MCG/2ML IJ SOLN
INTRAMUSCULAR | Status: DC | PRN
  Administered 2016-12-13 (×2): 25 ug via INTRAVENOUS
  Administered 2016-12-13: 50 ug via INTRAVENOUS
  Administered 2016-12-13 (×2): 25 ug via INTRAVENOUS

## 2016-12-13 MED ORDER — LIDOCAINE HCL (PF) 1 % IJ SOLN
INTRAMUSCULAR | Status: DC | PRN
  Administered 2016-12-13: 60 mL

## 2016-12-13 MED ORDER — METHYLPREDNISOLONE SODIUM SUCC 125 MG IJ SOLR
INTRAMUSCULAR | Status: AC
  Filled 2016-12-13: qty 2

## 2016-12-13 MED ORDER — HEPARIN (PORCINE) IN NACL 2-0.9 UNIT/ML-% IJ SOLN
INTRAMUSCULAR | Status: AC
  Filled 2016-12-13: qty 500

## 2016-12-13 MED ORDER — LIDOCAINE HCL (PF) 1 % IJ SOLN
INTRAMUSCULAR | Status: AC
  Filled 2016-12-13: qty 60

## 2016-12-13 MED ORDER — LIDOCAINE-EPINEPHRINE 1 %-1:100000 IJ SOLN
INTRAMUSCULAR | Status: AC
  Filled 2016-12-13: qty 1

## 2016-12-13 MED ORDER — IOPAMIDOL (ISOVUE-370) INJECTION 76%
INTRAVENOUS | Status: DC | PRN
  Administered 2016-12-13: 25 mL via INTRAVENOUS

## 2016-12-13 MED ORDER — GLIMEPIRIDE 1 MG PO TABS
2.0000 mg | ORAL_TABLET | Freq: Every day | ORAL | Status: DC
  Administered 2016-12-14: 2 mg via ORAL
  Filled 2016-12-13: qty 2

## 2016-12-13 MED ORDER — ALBUTEROL SULFATE (2.5 MG/3ML) 0.083% IN NEBU: 3.0000 mL | INHALATION_SOLUTION | Freq: Four times a day (QID) | RESPIRATORY_TRACT | Status: DC | PRN

## 2016-12-13 MED ORDER — DIAZEPAM 5 MG PO TABS
10.0000 mg | ORAL_TABLET | Freq: Once | ORAL | Status: AC
  Administered 2016-12-13: 10 mg via ORAL

## 2016-12-13 MED ORDER — CEFAZOLIN SODIUM-DEXTROSE 2-4 GM/100ML-% IV SOLN
2.0000 g | INTRAVENOUS | Status: AC
  Administered 2016-12-13: 2 g via INTRAVENOUS
  Filled 2016-12-13: qty 100

## 2016-12-13 MED ORDER — LORATADINE 10 MG PO TABS
10.0000 mg | ORAL_TABLET | Freq: Every day | ORAL | Status: DC
  Administered 2016-12-14: 10 mg via ORAL
  Filled 2016-12-13: qty 1

## 2016-12-13 MED ORDER — MUPIROCIN 2 % EX OINT
TOPICAL_OINTMENT | CUTANEOUS | Status: AC
  Administered 2016-12-13: 1 via TOPICAL
  Filled 2016-12-13: qty 22

## 2016-12-13 MED ORDER — ACETAMINOPHEN 325 MG PO TABS
325.0000 mg | ORAL_TABLET | ORAL | Status: DC | PRN
  Administered 2016-12-14: 650 mg via ORAL
  Filled 2016-12-13: qty 2

## 2016-12-13 MED ORDER — CHLORHEXIDINE GLUCONATE 4 % EX LIQD
60.0000 mL | Freq: Once | CUTANEOUS | Status: DC
  Filled 2016-12-13: qty 60

## 2016-12-13 MED ORDER — MUPIROCIN 2 % EX OINT
1.0000 "application " | TOPICAL_OINTMENT | Freq: Once | CUTANEOUS | Status: AC
  Administered 2016-12-13: 1 via TOPICAL
  Filled 2016-12-13: qty 22

## 2016-12-13 MED ORDER — SALINE SPRAY 0.65 % NA SOLN
2.0000 | Freq: Every day | NASAL | Status: DC | PRN
  Filled 2016-12-13: qty 44

## 2016-12-13 MED ORDER — FAMOTIDINE IN NACL 20-0.9 MG/50ML-% IV SOLN
20.0000 mg | Freq: Once | INTRAVENOUS | Status: AC
  Administered 2016-12-13: 20 mg via INTRAVENOUS

## 2016-12-13 MED ORDER — MIDAZOLAM HCL 5 MG/5ML IJ SOLN
INTRAMUSCULAR | Status: DC | PRN
  Administered 2016-12-13: 1 mg via INTRAVENOUS
  Administered 2016-12-13 (×2): 2 mg via INTRAVENOUS
  Administered 2016-12-13 (×2): 1 mg via INTRAVENOUS

## 2016-12-13 MED ORDER — SACUBITRIL-VALSARTAN 97-103 MG PO TABS
2.0000 | ORAL_TABLET | Freq: Two times a day (BID) | ORAL | Status: DC
  Administered 2016-12-13 – 2016-12-14 (×2): 2 via ORAL
  Filled 2016-12-13 (×2): qty 2

## 2016-12-13 MED ORDER — CEFAZOLIN SODIUM-DEXTROSE 2-4 GM/100ML-% IV SOLN
INTRAVENOUS | Status: AC
  Filled 2016-12-13: qty 100

## 2016-12-13 MED ORDER — HYDROCODONE-ACETAMINOPHEN 5-325 MG PO TABS
1.0000 | ORAL_TABLET | ORAL | Status: DC | PRN
  Administered 2016-12-13: 1 via ORAL
  Administered 2016-12-14: 2 via ORAL
  Filled 2016-12-13: qty 1
  Filled 2016-12-13: qty 2

## 2016-12-13 MED ORDER — DIAZEPAM 5 MG PO TABS
ORAL_TABLET | ORAL | Status: AC
  Administered 2016-12-13: 10 mg via ORAL
  Filled 2016-12-13: qty 2

## 2016-12-13 MED ORDER — FAMOTIDINE IN NACL 20-0.9 MG/50ML-% IV SOLN
INTRAVENOUS | Status: AC
  Filled 2016-12-13: qty 50

## 2016-12-13 MED ORDER — SODIUM CHLORIDE 0.9 % IR SOLN
Status: AC
  Filled 2016-12-13: qty 2

## 2016-12-13 MED ORDER — CEFAZOLIN SODIUM-DEXTROSE 1-4 GM/50ML-% IV SOLN
1.0000 g | Freq: Four times a day (QID) | INTRAVENOUS | Status: AC
  Administered 2016-12-13 – 2016-12-14 (×3): 1 g via INTRAVENOUS
  Filled 2016-12-13 (×3): qty 50

## 2016-12-13 MED ORDER — SODIUM CHLORIDE 0.9 % IR SOLN
80.0000 mg | Status: AC
  Administered 2016-12-13: 80 mg
  Filled 2016-12-13: qty 2

## 2016-12-13 SURGICAL SUPPLY — 19 items
ADAPTER SEALING SSA-EW-09 (MISCELLANEOUS) ×2 IMPLANT
CABLE SURGICAL S-101-97-12 (CABLE) ×2 IMPLANT
CATH ATTAIN SEL SURV 6248V-90 (CATHETERS) ×2 IMPLANT
CATH CPS DIRECT 135 DS2C020 (CATHETERS) ×2 IMPLANT
HEMOSTAT SURGICEL 2X4 FIBR (HEMOSTASIS) ×2 IMPLANT
ICD CLARIA MRI DTMA1QQ (ICD Generator) ×2 IMPLANT
KIT ESSENTIALS PG (KITS) ×2 IMPLANT
LEAD ATTAIN PERFORM ST 4398-88 (Lead) ×2 IMPLANT
LEAD CAPSURE NOVUS 45CM (Lead) ×2 IMPLANT
LEAD SPRINT QUAT SEC 6935M-62 (Lead) ×2 IMPLANT
PAD DEFIB LIFELINK (PAD) ×2 IMPLANT
SHEATH CLASSIC 7F (SHEATH) ×2 IMPLANT
SHEATH CLASSIC 9.5F (SHEATH) ×2 IMPLANT
SHEATH CLASSIC 9F (SHEATH) ×2 IMPLANT
SHIELD RADPAD SCOOP 12X17 (MISCELLANEOUS) ×2 IMPLANT
SLITTER UNIVERSAL DS2A003 (MISCELLANEOUS) ×2 IMPLANT
TRAY PACEMAKER INSERTION (PACKS) ×2 IMPLANT
WIRE ACUITY WHISPER EDS 4648 (WIRE) ×2 IMPLANT
WIRE HI TORQ VERSACORE-J 145CM (WIRE) ×2 IMPLANT

## 2016-12-13 NOTE — Plan of Care (Signed)
Problem: Safety: Goal: Ability to remain free from injury will improve Outcome: Completed/Met Date Met: 12/13/16 Patient is able to use the call light and knows how to call her RN/NT on white phone by looking for numbers on the white board, all personal items are within reach on her bedside stand and her family is also in with her at this time.

## 2016-12-13 NOTE — Progress Notes (Signed)
Report received via Human resources officer format, reviewed VS, and patient's general condition, assumed care of patient.

## 2016-12-13 NOTE — H&P (Signed)
Patient Care Team: Macon Large, MD as PCP - General (Internal Medicine)   HPI  Susan Peck is a 51 y.o. female Admitted for CRT implantation for NICM and LBBB and Class III CHF  She is on ,Guideline directed medical therapy with persistent CHF   She has frequent chest pains and chronic but stable DOE and some orthopnea She still does not have her CPAP working She has had no edema no palps or syncope  She is anxious and tearful     DATE TEST    9/12 Echo EF 25-30%   2/17    Cath   EF 35 % Normal CA  2/17    Echo   EF 30-35 %   3/*18 Echo EF 20-25%      Past Medical History:  Diagnosis Date  . Anxiety    no meds  . Asthma   . CHF (congestive heart failure) ([place])   . Depression    no meds  . Diabetes mellitus    recent dx 11/17/11 - started med 11/18/11 type 2  . GERD (gastroesophageal reflux disease)   . Goiter 07/27/2011   non-neoplastic goiter - fine needle aspiration - benign sees dr Dwyane Dee for  . Heart murmur    dx 2 yrs ago per pt  . Herpes genitalis in women   . Hypertension   . IBS (irritable bowel syndrome)    tx with diet per pt  . Low back pain    history  . Obesity   . Seasonal allergies   . Shortness of breath    occasional - exercise induced  . Sleep apnea    cpap broken  . Systolic heart failure    May 2011 EF 35-40%, 04/03/11 EF 25-30%, 06/27/11 EF 30-35%    Past Surgical History:  Procedure Laterality Date  . ABDOMINAL HYSTERECTOMY    . cardiac catherization  2004   [place], [place] - Dr Lynnell Jude  . CARDIAC CATHETERIZATION    . CARDIAC CATHETERIZATION N/A 09/15/2015   Procedure: Right/Left Heart Cath and Coronary Angiography;  Surgeon: Jolaine Artist, MD;  Location: [facility];  Service: Cardiovascular;  Laterality: N/A;  . COLONOSCOPY WITH PROPOFOL N/A 03/26/2015   Procedure: COLONOSCOPY WITH PROPOFOL;  Surgeon: Carol Ada, MD;  Location: [facility];  Service: Endoscopy;  Laterality: N/A;  . colonscopy      . CYSTOSCOPY  11/23/2011   Procedure: CYSTOSCOPY;  Surgeon: Jolayne Haines, MD;  Location: [facility];  Service: Gynecology;  Laterality: N/A;  . DIAGNOSTIC LAPAROSCOPY     of pelvis  . DILATION AND CURETTAGE OF UTERUS  12/2006,  10/2005   hysteroscopy surgery x 2  . LAPAROSCOPIC ASSISTED VAGINAL HYSTERECTOMY  11/23/2011   Procedure: LAPAROSCOPIC ASSISTED VAGINAL HYSTERECTOMY;  Surgeon: Jolayne Haines, MD;  Location: [facility];  Service: Gynecology;  Laterality: N/A;  . NOVASURE ABLATION     10/2005  . SALPINGOOPHORECTOMY  11/23/2011   Procedure: SALPINGO OOPHERECTOMY;  Surgeon: Jolayne Haines, MD;  Location: [facility];  Service: Gynecology;  Laterality: Bilateral;  . SVD     x 3  . TUBAL LIGATION    . WISDOM TOOTH EXTRACTION      No current facility-administered medications for this encounter.    Facility-Administered Medications Ordered in Other Encounters  Medication Dose Route Frequency Provider Last Rate Last Dose  . aminophylline injection 150 mg  150 mg Intravenous BID PRN Larey Dresser, MD   150  mg at 12/24/14 1005    Allergies  Allergen Reactions  . Shrimp [Shellfish Allergy] Anaphylaxis      Social History  Substance Use Topics  . Smoking status: Never Smoker  . Smokeless tobacco: Never Used  . Alcohol use No     Family History  Problem Relation Age of Onset  . Heart disease Maternal Aunt   . Breast cancer Mother   . Thyroid disease Mother   . Hypertension Maternal Grandmother   . Diabetes Maternal Grandmother   . Breast cancer Maternal Aunt   . Heart disease Maternal Aunt   . Diabetes Maternal Grandfather   . Diabetes Paternal Grandmother   . Diabetes Paternal Grandfather   . Hypertension Paternal Grandfather      Current Facility-Administered Medications on File Prior to Encounter  Medication Dose Route Frequency Provider Last Rate Last Dose  . aminophylline injection 150 mg  150 mg Intravenous BID PRN Larey Dresser, MD   150 mg at 12/24/14 1005   Current  Outpatient Prescriptions on File Prior to Encounter  Medication Sig Dispense Refill  . albuterol (PROVENTIL HFA;VENTOLIN HFA) 108 (90 Base) MCG/ACT inhaler Inhale 1-2 puffs into the lungs every 6 (six) hours as needed for wheezing or shortness of breath. 1 Inhaler 0  . aspirin 81 MG chewable tablet Chew 81 mg by mouth daily.     . Aspirin-Acetaminophen-Caffeine (GOODY HEADACHE PO) Take 1 packet by mouth daily as needed (pain/headache).    . carvedilol (COREG) 25 MG tablet Take 1 tablet (25 mg total) by mouth 2 (two) times daily with a meal. 60 tablet 11  . fluticasone (FLONASE) 50 MCG/ACT nasal spray Place 1 spray into both nostrils 2 (two) times daily as needed for allergies or rhinitis. 16 g 3  . furosemide (LASIX) 40 MG tablet TAKE 1 TABLET (40 MG TOTAL) BY MOUTH 2 (TWO) TIMES DAILY. 60 tablet 3  . glimepiride (AMARYL) 2 MG tablet Take 2 mg by mouth daily.     Marland Kitchen levothyroxine (SYNTHROID, LEVOTHROID) 50 MCG tablet Take 1 tablet (50 mcg total) by mouth daily. 30 tablet 4  . loratadine (CLARITIN) 10 MG tablet Take 1 tablet (10 mg total) by mouth daily. 30 tablet 2  . OVER THE COUNTER MEDICATION Place 1 spray into both nostrils daily as needed (congestion). Generic Vicks twisthaler    . ranitidine (ZANTAC) 150 MG tablet Take 1 tablet (150 mg total) by mouth 2 (two) times daily. (Patient taking differently: Take 150 mg by mouth daily as needed for heartburn. ) 180 tablet 3  . sacubitril-valsartan (ENTRESTO) 97-103 MG Take 1 tablet by mouth 2 (two) times daily. (Patient taking differently: Take 2 tablets by mouth 2 (two) times daily. ) 60 tablet 3  . sodium chloride (OCEAN) 0.65 % SOLN nasal spray Place 2 sprays into both nostrils daily as needed for congestion.    Marland Kitchen BAYER CONTOUR TEST test strip   1  . chlorpheniramine-HYDROcodone (TUSSIONEX PENNKINETIC ER) 10-8 MG/5ML SUER Take 5 mLs by mouth every 12 (twelve) hours as needed for cough. (Patient not taking: Reported on 12/12/2016) 115 mL 0  .  dicyclomine (BENTYL) 20 MG tablet Take 1 tablet (20 mg total) by mouth 2 (two) times daily. (Patient not taking: Reported on 12/12/2016) 20 tablet 0  . estradiol (ESTRACE) 1 MG tablet Take 1 tablet (1 mg total) by mouth daily. (Patient not taking: Reported on 12/12/2016) 30 tablet 11  . PRESCRIPTION MEDICATION Inhale into the lungs at bedtime. CPAP  Review of Systems negative except from HPI and PMH  Physical Exam BP (!) 134/91   Pulse 74   Temp 98.2 F (36.8 C)   Resp 16   Ht 5\' 4"  (1.626 m)   Wt 182 lb (82.6 kg)   SpO2 99%   BMI 31.24 kg/m  Well developed and well nourished in no acute distress HENT normal E scleral and icterus clear Neck Supple JVP flat; carotids brisk and full Clear to ausculation Regular rate and rhythm, no murmurs gallops or rub Soft with active bowel sounds No clubbing cyanosis  Edema Alert and oriented, grossly normal motor and sensory function Skin Warm and Dry  ECG sinus with LBBB 156  Assessment and  Plan  Nonischemic cardiomyopathy  Congestive heart failure-chronic-systolic-class III  Probable obstructive sleep apnea  Left bundle branch block  High anxiety   Stable heart failure status   Have reviewed the potential benefits and risks of ICD implantation including but not limited to death, perforation of heart or lung, lead dislodgement, infection,  device malfunction and inappropriate shocks.  The patient and family express understanding  and are willing to proceed.    Will give preprocedural anxiolysis

## 2016-12-13 NOTE — Discharge Summary (Signed)
ELECTROPHYSIOLOGY PROCEDURE DISCHARGE SUMMARY    Patient ID: Susan Peck,  MRN: [id number], DOB/AGE: 1966/06/02 51 y.o.  Admit date: 12/13/2016 Discharge date: 12/14/2016  Primary Care Physician: Macon Large, MD Primary Cardiologist: [place] Electrophysiologist: Caryl Comes  Primary Discharge Diagnosis:  NICM, CHF status post CRTD implant this admission  Secondary Discharge Diagnosis:  1.  LBBB 2.  Diabetes 3.  HTN 4.  Obesity  Allergies  Allergen Reactions  . Shrimp [Shellfish Allergy] Anaphylaxis     Procedures This Admission:  1.  Implantation of a MDT CRTD on 12/13/16 by Dr Caryl Comes. See op note for full details. There were no immediate post procedure complications. 2.  CXR on 12/13/16 demonstrated no pneumothorax status post device implantation.   Brief HPI: Susan Peck is a 51 y.o. female was referred to electrophysiology in the outpatient setting for consideration of ICD implantation.  Past medical history includes NICM, CHF, LBBB.  The patient has persistent LV dysfunction despite guideline directed therapy.  Risks, benefits, and alternatives to CRTD implantation were reviewed with the patient who wished to proceed.   Hospital Course:  The patient was admitted and underwent implantation of a CRTD with details as outlined above. She was monitored on telemetry overnight which demonstrated sinus rhythm with V pacing.  Left chest was without hematoma or ecchymosis.  The device was interrogated and found to be functioning normally.  CXR was obtained and demonstrated no pneumothorax status post device implantation.  Wound care, arm mobility, and restrictions were reviewed with the patient.  The patient was examined and considered stable for discharge to home.   She had some diaphragmatic stim overnight but none the morning of discharge with LV vector programmed LV2-LV3.  The patient's discharge medications include an ARB (Entresto) and beta blocker (Coreg).    Physical Exam: Vitals:   12/13/16 2100 12/14/16 0017 12/14/16 0436 12/14/16 0802  BP: 110/72 (!) 155/94 123/67 116/78  Pulse: 67 68 64   Resp: 13 16 (!) 21 10  Temp:  97.8 F (36.6 C) 97.9 F (36.6 C)   TempSrc:  Oral Oral   SpO2: 94% 97% 99% 97%  Weight:   184 lb 11.2 oz (83.8 kg)   Height:        Labs:   Lab Results  Component Value Date   WBC 7.4 12/04/2016   HGB 12.3 10/03/2016   HCT 39.0 12/04/2016   MCV 81 12/04/2016   PLT 230 12/04/2016   No results for input(s): NA, K, CL, CO2, BUN, CREATININE, CALCIUM, PROT, BILITOT, ALKPHOS, ALT, AST, GLUCOSE in the last 168 hours.  Invalid input(s): LABALBU  Discharge Medications:  Allergies as of 12/14/2016      Reactions   Shrimp [shellfish Allergy] Anaphylaxis      Medication List    TAKE these medications   albuterol 108 (90 Base) MCG/ACT inhaler Commonly known as:  PROVENTIL HFA;VENTOLIN HFA Inhale 1-2 puffs into the lungs every 6 (six) hours as needed for wheezing or shortness of breath.   aspirin 81 MG chewable tablet Chew 81 mg by mouth daily.   atorvastatin 40 MG tablet Commonly known as:  LIPITOR Take 40 mg by mouth daily.   BAYER CONTOUR TEST test strip Generic drug:  glucose blood   carvedilol 25 MG tablet Commonly known as:  COREG Take 1 tablet (25 mg total) by mouth 2 (two) times daily with a meal.   chlorpheniramine-HYDROcodone 10-8 MG/5ML Suer Commonly known as:  Cathie Hoops ER Take 5  mLs by mouth every 12 (twelve) hours as needed for cough.   dicyclomine 20 MG tablet Commonly known as:  BENTYL Take 1 tablet (20 mg total) by mouth 2 (two) times daily.   estradiol 1 MG tablet Commonly known as:  ESTRACE Take 1 tablet (1 mg total) by mouth daily.   fluticasone 50 MCG/ACT nasal spray Commonly known as:  FLONASE Place 1 spray into both nostrils 2 (two) times daily as needed for allergies or rhinitis.   furosemide 40 MG tablet Commonly known as:  LASIX TAKE 1 TABLET (40 MG  TOTAL) BY MOUTH 2 (TWO) TIMES DAILY.   glimepiride 2 MG tablet Commonly known as:  AMARYL Take 2 mg by mouth daily.   GOODY HEADACHE PO Take 1 packet by mouth daily as needed (pain/headache).   levothyroxine 50 MCG tablet Commonly known as:  SYNTHROID, LEVOTHROID Take 1 tablet (50 mcg total) by mouth daily.   loratadine 10 MG tablet Commonly known as:  CLARITIN Take 1 tablet (10 mg total) by mouth daily.   OVER THE COUNTER MEDICATION Place 1 spray into both nostrils daily as needed (congestion). Generic Vicks twisthaler   PRESCRIPTION MEDICATION Inhale into the lungs at bedtime. CPAP   ranitidine 150 MG tablet Commonly known as:  ZANTAC Take 1 tablet (150 mg total) by mouth 2 (two) times daily. What changed:  when to take this  reasons to take this   sacubitril-valsartan 97-103 MG Commonly known as:  ENTRESTO Take 1 tablet by mouth 2 (two) times daily. What changed:  how much to take   sodium chloride 0.65 % Soln nasal spray Commonly known as:  OCEAN Place 2 sprays into both nostrils daily as needed for congestion.       Disposition:  Discharge Instructions    Diet - low sodium heart healthy    Complete by:  As directed    Increase activity slowly    Complete by:  As directed      Follow-up Information    [facility] Follow up on 12/27/2016.   Specialty:  Cardiology Why:  at 11AM for wound check  Contact information: [address] [place] Follow up on 01/16/2017.   Specialty:  Cardiology Why:  at [facility] information: [address] [phone] [place]       Deboraha Sprang, MD Follow up on 03/23/2017.   Specialty:  Cardiology Why:  at 3:15PM  Contact information: [address] [place] [address] [phone]           Duration of  Discharge Encounter: Greater than 30 minutes including physician time.  Signed, Chanetta Marshall, NP 12/14/2016 8:09 AM   Pt seen and examined  Device function normal  reptrogamed 2/2 phrenic stimulation. Chest x-ray demonstrated mild retraction of the lead Pocket without hematoma

## 2016-12-13 NOTE — Progress Notes (Signed)
Phrenic nerve stimulation overnight.   BV pacing turned off (RV pacing only).   Reassess in morning.  Allyson Sabal, MD Cardiology

## 2016-12-13 NOTE — Op Note (Deleted)
  The note originally documented on this encounter has been moved the the encounter in which it belongs.  

## 2016-12-13 NOTE — Progress Notes (Signed)
Patient keeps C/O her left side of her stomach won't quit jumping. I explained to her that her pacing wires were placed in her heart and attached in different areas and one site could stimulate the diaphrapm to make a hiccup motion but I text paged the Cardiology fellow to come up to check her to verify and to put her mind at ease, will continue to monitor, all VSS and site looks clean, dry and intact with no complications noted.

## 2016-12-13 NOTE — Op Note (Signed)
NAME:  Susan Peck, Susan Peck              ACCOUNT NO.:  [id number]  MEDICAL RECORD NO.:  [id number]  LOCATION:  E40C                         FACILITY:  [facility]  PHYSICIAN:  Deboraha Sprang, MD, FACCDATE OF BIRTH:  Dec 18, 1965  DATE OF PROCEDURE:  12/13/2016 DATE OF DISCHARGE:                              OPERATIVE REPORT   PREOPERATIVE DIAGNOSIS:  Nonischemic cardiomyopathy, congestive heart failure, and left bundle-branch block.  POSTOPERATIVE DIAGNOSIS:  Nonischemic cardiomyopathy, congestive heart failure, and left bundle-branch block.  PROCEDURE:  Dual-chamber defibrillator implantation with left ventricular lead placement.  DESCRIPTION OF PROCEDURE:  Following obtaining informed consent, the patient was brought to the electrophysiology laboratory and placed on the fluoroscopic table in supine position.  After routine prep and drape of the left upper chest, lidocaine was infiltrated in the prepectoral subclavicular region.  Incision was made and carried down to layer of the prepectoral fascia using electrocautery and sharp dissection.  A pocket was formed similarly and hemostasis was obtained.  Thereafter, attention was turned to gain access to the extrathoracic left subclavian vein, which was accomplished without difficulty, without the aspiration of air or puncture of the artery.  Three separate venipunctures were accomplished.  Guidewires were placed and retained and sequentially a 9-French, 9.5-French and 7-French sheaths were placed which were passed a Medtronic 6935, 62 cm, active fixation defibrillator lead, serial number [id number], a St. Jude 135 CS cannulation sheath and a Medtronic 5076, 45 cm active fixation atrial lead, serial number [id number].  Under fluoroscopic guidance, the RV lead was manipulated to the apex with bipolar R-wave was 16.8, the pace impedance was 630, threshold 0.6 V at 0.5 milliseconds.  Current threshold was 1.0 mA and there was no diaphragmatic  pacing at 10 V.  The current of injury was brisk.  This lead was secured to the prepectoral fascia.  We then cannulated the coronary sinus without difficulty.  Nonocclusive venogram demonstrated 2 venous branches.  They are both high.  The first one was cannulated.  We were able to deploy a wire to the apex. Unfortunately however, these are the leads nor an Attain II system was able to be deployed over the wire into the vein.  We then targeted the higher branch.  We were able to wire this.  We were able to deploy a 4398 Medtronic 88 cm lead, serial number [id number]. It was deployed to its distal ramification, which was at the junction between the mid and distal third.  In this location, the bipolar L-wave was 14.8 with a pace impedance of 836, a threshold of 6.5 V at 0.5 milliseconds.  Current threshold was 0.8 mA.  There was no diaphragmatic pacing at 10 V.  The Q-LV was 150 milliseconds and there was no diaphragmatic pacing at 10 V.  The 9.5-French sheath was removed.  We then deployed the atrial lead in the right atrial appendage with bipolar P-wave was 2.1 with a pace impedance of 1037 ohms and threshold of 1.2 V at 0.5 milliseconds.  Current threshold is 1.1 mA and there is no diaphragmatic pacing at 10 V.  The current of injury was brisk.  This lead was secured to the prepectoral fascia.  We then removed  the LV deployment.  There is a little bit of retrograde movement of the LV lead.  However, it been a straight lead, I was not able either to deploy a further [place].  It seemed that it was going to be insecure in this location, so it was secured in this location.  The pocket was then copiously irrigated with antibiotic-containing saline solution.  The leads were then attached to a Medtronic Claria MRI compatible device, serial number [id number].  Through the device, bipolar P-wave was 2.3 with a pace impedance of 570, threshold of 0.5 V at 0.4 milliseconds.  The R-wave was  19.1 with a pace impedance of 475, a threshold of 0.5 V at 0.4 milliseconds, and the LV impedance was 67 and threshold 0.5 V at 0.4 milliseconds and this was in the 1-2 configuration.  The leads in the pulse generator were then placed in the pocket and secured to the prepectoral fascia.  Hemostasis having been assured and the pocket having been copiously irrigated with antibiotic-containing saline solution.  The device was secured and the wound was then closed in 3 layers in a normal fashion.  The wound was washed and then a Dermabond dressing was applied.  I should note that Surgicel was placed in the cephalad aspect of the pocket.  The patient tolerated the procedure without apparent complication.     Deboraha Sprang, MD, [facility]     SCK/MEDQ  D:  12/13/2016  T:  12/13/2016  Job:  [id number]

## 2016-12-13 NOTE — Plan of Care (Signed)
Problem: Pain Managment: Goal: General experience of comfort will improve Outcome: Completed/Met Date Met: 12/13/16 Patient is not having pain but is able to let me know if she is, where it is, if it radiates anywhere and how bad it is.

## 2016-12-13 NOTE — Discharge Instructions (Signed)
° ° °  Supplemental Discharge Instructions for  Pacemaker/Defibrillator Patients  Activity No heavy lifting or vigorous activity with your left/right arm for 6 to 8 weeks.  Do not raise your left/right arm above your head for one week.  Gradually raise your affected arm as drawn below.           __      12/17/16                        12/18/16                      12/19/16                 12/20/16  NO DRIVING for 1 week    ; you may begin driving on    3/53/29 .  WOUND CARE - Keep the wound area clean and dry.   - No bandage is needed on the site.  DO  NOT apply any creams, oils, or ointments to the wound area. - If you notice any drainage or discharge from the wound, any swelling or bruising at the site, or you develop a fever > 101? F after you are discharged home, call the office at once.  Special Instructions - You are still able to use cellular telephones; use the ear opposite the side where you have your pacemaker/defibrillator.  Avoid carrying your cellular phone near your device. - When traveling through airports, show security personnel your identification card to avoid being screened in the metal detectors.  Ask the security personnel to use the hand wand. - Avoid arc welding equipment, MRI testing (magnetic resonance imaging), TENS units (transcutaneous nerve stimulators).  Call the office for questions about other devices. - Avoid electrical appliances that are in poor condition or are not properly grounded. - Microwave ovens are safe to be near or to operate.  Additional information for defibrillator patients should your device go off: - If your device goes off ONCE and you feel fine afterward, notify the device clinic nurses. - If your device goes off ONCE and you do not feel well afterward, call 911. - If your device goes off TWICE, call 911. - If your device goes off THREE times in one day, call 911.  DO NOT DRIVE YOURSELF OR A FAMILY MEMBER WITH A DEFIBRILLATOR TO THE  HOSPITAL--CALL 911.

## 2016-12-14 ENCOUNTER — Ambulatory Visit (HOSPITAL_COMMUNITY): Payer: Medicaid Other

## 2016-12-14 ENCOUNTER — Encounter (HOSPITAL_COMMUNITY): Payer: Self-pay | Admitting: Internal Medicine

## 2016-12-14 DIAGNOSIS — I110 Hypertensive heart disease with heart failure: Secondary | ICD-10-CM | POA: Diagnosis not present

## 2016-12-14 DIAGNOSIS — I447 Left bundle-branch block, unspecified: Secondary | ICD-10-CM | POA: Diagnosis not present

## 2016-12-14 DIAGNOSIS — I428 Other cardiomyopathies: Secondary | ICD-10-CM | POA: Diagnosis not present

## 2016-12-14 DIAGNOSIS — I429 Cardiomyopathy, unspecified: Secondary | ICD-10-CM | POA: Diagnosis not present

## 2016-12-14 DIAGNOSIS — I5022 Chronic systolic (congestive) heart failure: Secondary | ICD-10-CM | POA: Diagnosis not present

## 2016-12-14 MED ORDER — PHENOL 1.4 % MT LIQD: 1.0000 | OROMUCOSAL | Status: DC | PRN

## 2016-12-19 ENCOUNTER — Telehealth: Payer: Self-pay | Admitting: Cardiology

## 2016-12-19 NOTE — Telephone Encounter (Signed)
[name] was advised that she can stop wearing the sling but needs to follow the following physical restrictions: no lifting >10 lbs with affected arm, avoid pushing/pulling with affected arm, do not lift arm higher than elbow to shoulder (goal post arms) until wound check.  She asked about pain management- I advised to use OTC tylenol and not to exceed 4g in 24 hours.  She asked if she could have someone to come help her at home because she was leaving her father's house today and would be home alone. I advised that we do not order home health due to minimal physical restrictions- she should contact PCP. She reports that she is applying for medicaid and was working with another office about getting some "help." Confirmed wound check 12/27/16.

## 2016-12-19 NOTE — Telephone Encounter (Signed)
Pt called and wanted to know how long she needed to wear the sling. She had an ICD placed on 12-14-2016.

## 2016-12-27 ENCOUNTER — Ambulatory Visit (INDEPENDENT_AMBULATORY_CARE_PROVIDER_SITE_OTHER): Payer: Medicaid Other | Admitting: *Deleted

## 2016-12-27 DIAGNOSIS — I428 Other cardiomyopathies: Secondary | ICD-10-CM | POA: Diagnosis not present

## 2016-12-27 DIAGNOSIS — I447 Left bundle-branch block, unspecified: Secondary | ICD-10-CM | POA: Diagnosis not present

## 2016-12-27 DIAGNOSIS — I5022 Chronic systolic (congestive) heart failure: Secondary | ICD-10-CM

## 2016-12-27 LAB — CUP PACEART INCLINIC DEVICE CHECK
Battery Remaining Longevity: 94 mo
Battery Voltage: 3.09 V
Brady Statistic AP VP Percent: 1.77 %
Brady Statistic AP VS Percent: 0.01 %
Brady Statistic RA Percent Paced: 1.78 %
HighPow Impedance: 56 Ohm
Implantable Lead Implant Date: 20180524
Implantable Lead Implant Date: 20180524
Implantable Lead Location: 753858
Implantable Lead Location: 753859
Implantable Lead Location: 753860
Implantable Lead Model: 4398
Implantable Lead Model: 5076
Implantable Pulse Generator Implant Date: 20180524
Lead Channel Impedance Value: 136.276
Lead Channel Impedance Value: 165.029
Lead Channel Impedance Value: 247 Ohm
Lead Channel Impedance Value: 304 Ohm
Lead Channel Impedance Value: 304 Ohm
Lead Channel Impedance Value: 456 Ohm
Lead Channel Impedance Value: 456 Ohm
Lead Channel Impedance Value: 456 Ohm
Lead Channel Impedance Value: 532 Ohm
Lead Channel Impedance Value: 532 Ohm
Lead Channel Pacing Threshold Amplitude: 0.5 V
Lead Channel Pacing Threshold Pulse Width: 0.4 ms
Lead Channel Pacing Threshold Pulse Width: 0.4 ms
Lead Channel Pacing Threshold Pulse Width: 0.4 ms
Lead Channel Sensing Intrinsic Amplitude: 27.5 mV
Lead Channel Sensing Intrinsic Amplitude: 3.125 mV
Lead Channel Setting Pacing Amplitude: 2.25 V
Lead Channel Setting Pacing Pulse Width: 0.4 ms
Lead Channel Setting Sensing Sensitivity: 0.3 mV
MDC IDC LEAD IMPLANT DT: 20180524
MDC IDC MSMT LEADCHNL LV IMPEDANCE VALUE: 136.276
MDC IDC MSMT LEADCHNL LV IMPEDANCE VALUE: 146.656
MDC IDC MSMT LEADCHNL LV IMPEDANCE VALUE: 152 Ohm
MDC IDC MSMT LEADCHNL LV IMPEDANCE VALUE: 361 Ohm
MDC IDC MSMT LEADCHNL LV IMPEDANCE VALUE: 456 Ohm
MDC IDC MSMT LEADCHNL LV IMPEDANCE VALUE: 475 Ohm
MDC IDC MSMT LEADCHNL LV PACING THRESHOLD AMPLITUDE: 0.75 V
MDC IDC MSMT LEADCHNL RV IMPEDANCE VALUE: 361 Ohm
MDC IDC MSMT LEADCHNL RV IMPEDANCE VALUE: 513 Ohm
MDC IDC MSMT LEADCHNL RV PACING THRESHOLD AMPLITUDE: 0.5 V
MDC IDC SESS DTM: 20180606113524
MDC IDC SET LEADCHNL RA PACING AMPLITUDE: 3.5 V
MDC IDC SET LEADCHNL RV PACING AMPLITUDE: 3.5 V
MDC IDC SET LEADCHNL RV PACING PULSEWIDTH: 0.4 ms
MDC IDC STAT BRADY AS VP PERCENT: 98.18 %
MDC IDC STAT BRADY AS VS PERCENT: 0.05 %
MDC IDC STAT BRADY RV PERCENT PACED: 99.91 %

## 2016-12-27 NOTE — Progress Notes (Signed)
Wound check appointment. Dermabond removed. Wound without redness or edema. Incision edges approximated, wound well healed. Normal device function. Thresholds, sensing, and impedances consistent with implant measurements. Device programmed at 3.5V for extra safety margin until 3 month visit. Histogram distribution appropriate for patient and level of activity. 99.9% effective BiV pacing. No mode switches or ventricular arrhythmias noted. Patient educated about wound care, arm mobility, lifting restrictions, shock plan. ROV with SK 03/23/17.

## 2016-12-28 ENCOUNTER — Telehealth (HOSPITAL_COMMUNITY): Payer: Self-pay

## 2016-12-28 ENCOUNTER — Telehealth (HOSPITAL_COMMUNITY): Payer: Self-pay | Admitting: *Deleted

## 2016-12-28 NOTE — Telephone Encounter (Signed)
-----   Message from Patsey Berthold, NP sent at 12/28/2016 12:41 PM EDT ----- Regarding: RE: Ok to participate in Cardiac Rehab? Ok to participate. No lifting >10 pounds for 4 weeks with left arm.   [place]  ----- Message ----- From: Rowe Pavy, RN Sent: 12/28/2016  12:28 PM To: Patsey Berthold, NP Subject: Ok to participate in Cardiac Rehab?             The above pt was referred to cardiac rehab for systolic CHF.  Pt had ICD implanted on 5/23.  Pt seen in clinic on yesterday for a wound check.  May pt proceed with cardiac rehab?  If so any restrictions of activity or hand weight limitation to the affected side?   Thanks for the input and guidance Psychologist, clinical, BSN Cardiac and Pulmonary Rehab Nurse Navigator

## 2016-12-28 NOTE — Telephone Encounter (Signed)
I called and left message on patient voicemail about scheduling for cardiac rehab. I was following up on our conversation previously on 12/01/16. I left my contact information on patient voicemail to return my call.

## 2016-12-28 NOTE — Telephone Encounter (Signed)
Patient returned my call and scheduled for orientation and cardiac rehab classes.

## 2017-01-15 ENCOUNTER — Telehealth (HOSPITAL_COMMUNITY): Payer: Self-pay

## 2017-01-15 NOTE — Telephone Encounter (Addendum)
Patient called and informed me that she now has Medicaid. Patient informed that I will send medicaid reimbursement form to Dr. Haroldine Laws to complete and sign. Patient has an appointment with there office tomorrow 01/16/17. I also called and spoke to [facility] - Dr. Clayborne Dana nurse, to ask if it can be filled out and faxed back tomorrow. She will have him sign tomorrow. I informed patient once I receive the completed form, I will contact her to let her know I received it and she can keep her appointment for 07/03/18with cardiac rehab.

## 2017-01-16 ENCOUNTER — Telehealth (HOSPITAL_COMMUNITY): Payer: Self-pay | Admitting: Pharmacist

## 2017-01-16 ENCOUNTER — Ambulatory Visit (HOSPITAL_COMMUNITY)
Admission: RE | Admit: 2017-01-16 | Discharge: 2017-01-16 | Disposition: A | Discharge status: Home / Self Care | Payer: Medicaid Other | Source: Ambulatory Visit | Attending: Internal Medicine | Admitting: Internal Medicine

## 2017-01-16 VITALS — BP 110/74 | HR 77 | Wt 187.8 lb

## 2017-01-16 DIAGNOSIS — Z8349 Family history of other endocrine, nutritional and metabolic diseases: Secondary | ICD-10-CM | POA: Insufficient documentation

## 2017-01-16 DIAGNOSIS — Z91013 Allergy to seafood: Secondary | ICD-10-CM | POA: Diagnosis not present

## 2017-01-16 DIAGNOSIS — E119 Type 2 diabetes mellitus without complications: Secondary | ICD-10-CM | POA: Diagnosis not present

## 2017-01-16 DIAGNOSIS — Z7984 Long term (current) use of oral hypoglycemic drugs: Secondary | ICD-10-CM | POA: Diagnosis not present

## 2017-01-16 DIAGNOSIS — E669 Obesity, unspecified: Secondary | ICD-10-CM | POA: Diagnosis not present

## 2017-01-16 DIAGNOSIS — E785 Hyperlipidemia, unspecified: Secondary | ICD-10-CM

## 2017-01-16 DIAGNOSIS — Z95810 Presence of automatic (implantable) cardiac defibrillator: Secondary | ICD-10-CM | POA: Insufficient documentation

## 2017-01-16 DIAGNOSIS — I110 Hypertensive heart disease with heart failure: Secondary | ICD-10-CM | POA: Diagnosis not present

## 2017-01-16 DIAGNOSIS — IMO0002 Reserved for concepts with insufficient information to code with codable children: Secondary | ICD-10-CM

## 2017-01-16 DIAGNOSIS — Z6832 Body mass index (BMI) 32.0-32.9, adult: Secondary | ICD-10-CM | POA: Diagnosis not present

## 2017-01-16 DIAGNOSIS — Z79899 Other long term (current) drug therapy: Secondary | ICD-10-CM | POA: Diagnosis not present

## 2017-01-16 DIAGNOSIS — I428 Other cardiomyopathies: Secondary | ICD-10-CM | POA: Diagnosis not present

## 2017-01-16 DIAGNOSIS — I10 Essential (primary) hypertension: Secondary | ICD-10-CM | POA: Diagnosis not present

## 2017-01-16 DIAGNOSIS — E1165 Type 2 diabetes mellitus with hyperglycemia: Secondary | ICD-10-CM | POA: Diagnosis not present

## 2017-01-16 DIAGNOSIS — E042 Nontoxic multinodular goiter: Secondary | ICD-10-CM | POA: Insufficient documentation

## 2017-01-16 DIAGNOSIS — I5022 Chronic systolic (congestive) heart failure: Secondary | ICD-10-CM | POA: Diagnosis not present

## 2017-01-16 DIAGNOSIS — K219 Gastro-esophageal reflux disease without esophagitis: Secondary | ICD-10-CM | POA: Diagnosis not present

## 2017-01-16 DIAGNOSIS — Z7982 Long term (current) use of aspirin: Secondary | ICD-10-CM | POA: Insufficient documentation

## 2017-01-16 DIAGNOSIS — I447 Left bundle-branch block, unspecified: Secondary | ICD-10-CM | POA: Diagnosis not present

## 2017-01-16 DIAGNOSIS — E1169 Type 2 diabetes mellitus with other specified complication: Secondary | ICD-10-CM | POA: Diagnosis not present

## 2017-01-16 DIAGNOSIS — Z803 Family history of malignant neoplasm of breast: Secondary | ICD-10-CM | POA: Diagnosis not present

## 2017-01-16 DIAGNOSIS — J45909 Unspecified asthma, uncomplicated: Secondary | ICD-10-CM | POA: Diagnosis not present

## 2017-01-16 DIAGNOSIS — G4733 Obstructive sleep apnea (adult) (pediatric): Secondary | ICD-10-CM | POA: Diagnosis not present

## 2017-01-16 LAB — BASIC METABOLIC PANEL
Anion gap: 8 (ref 5–15)
BUN: 13 mg/dL (ref 6–20)
CO2: 25 mmol/L (ref 22–32)
Calcium: 9.2 mg/dL (ref 8.9–10.3)
Chloride: 103 mmol/L (ref 101–111)
Creatinine, Ser: 0.56 mg/dL (ref 0.44–1.00)
GFR calc non Af Amer: >60 mL/min (ref >60)
Glucose, Bld: 226 mg/dL — ABNORMAL HIGH (ref 65–99)
Potassium: 3.9 mmol/L (ref 3.5–5.1)
SODIUM: 136 mmol/L (ref 135–145)

## 2017-01-16 LAB — CBC
HEMATOCRIT: 39.7 % (ref 36.0–46.0)
HEMOGLOBIN: 13.8 g/dL (ref 12.0–15.0)
MCH: 27.2 pg (ref 26.0–34.0)
MCHC: 34.8 g/dL (ref 30.0–36.0)
MCV: 78.3 fL (ref 78.0–100.0)
Platelets: 210 10*3/uL (ref 150–400)
RBC: 5.07 MIL/uL (ref 3.87–5.11)
RDW: 13.2 % (ref 11.5–15.5)
WBC: 7.1 10*3/uL (ref 4.0–10.5)

## 2017-01-16 NOTE — Progress Notes (Signed)
Patient ID: Susan Peck, female   DOB: Nov 01, 1965, 51 y.o.   MRN: [id number]   Advanced Heart Failure Clinic Note   PCP: Dr Rae Mar GYN: Dr Carolin Guernsey Pulmonologist: Dr Gwenette Greet HF: Dr. Haroldine Laws   HPI:  Susan Peck is a 51 year old African American female with systolic heart failure, HTN, GERD, 11/23/11 S/P Hysterectomy, multinodular goiter and asthma.   2004 cardiac cath by Dr Lynnell Jude in [place], [place] with one blockage noted - no stent. EF said to be normal. About 1 year ago at [facility] noted murmur and she was referred to Dr Tamala Julian EF 25-30%. More recently, she has been followed in CHF clinic.   Seen in Clinic 09/2016 with Echo showing persistently reduced EF. Sent to EP  S/p Medtronic BiV ICD 12/13/16  She presents today for regular follow up. Last appt referred for ICD.  Now s/p Medtronic CRT-D. Stable EP visit 12/27/16 (Wound check). Thinks she feels better overall. Plans to start cardiac rehab.  Just approved for Medicaid, we should've gotten paperwork yesterday in Fax.  Breathing has been "much better". Maybe has to stop once or twice around [employer], and SOB up the steps. Denies lightheadedness or dizziness. Has had some chest soreness after ICD. No exertional chest pain. Watching fluid and salt. Weight at home ~180. Taking all medications. Sleeps on 2-3 pillows for comfort due to her back.    Echo 10/16/16 EF 20-25%  ICD interrogation: Device too recent for Optivol,PT activity ~ 2 hours daily.  04/03/2011  ECHO EF 25-30% 06/26/2012 ECHO EF 30-35%  02/29/2012 ECHO EF 35-40%  11/18/12 ECHO EF 55% Inferior wall mildly hypokinetic with septal HK Myoview 02/03/10 EF 42% No ischemia Septal and apical hypokinesis 11/14 Cardiac MRI  EF 52%- septal bounce suggestive of LBBB. Dyssynchrony from LBBB leads to the low calculated EF. Normal RV size and systolic function. No myocardial delayed enhancement, so no definite evidence for prior myocardial infarction, myocarditis, or infiltrative disease. 1/16 CT  chest - small pleural effusions with early pulmonary edema. No ILD. 09/03/2014: ECHO EF 20-25%  6/16: ECHO EF 35-40% Cardiolite (6/16): EF 36%, no ischemia/infarction.  09/07/2015: ECHO EF 30-35%   CPX 11/20/2014  Peak VO2: 16.1 (76.8% predicted peak VO2) - when corrected to ideal BW pVO2 is 22.9 VE/VCO2 slope: 28.5 OUES: 1.59 Peak RER: 1.18  LHC/RHC 09/15/2015  RA = 2 RV = 22/1/2 PA = 27/8 (19) PCW = 8 Fick cardiac output/index = 5.22/2.8 PVR = 2.1 Ao sat = 98% PA sat = 70%, 71% Assessment: 1. Normal coronary arteries 2. Normal hemodynamics  3. LVEF 35-40% with global HK due to   Labs 08/19/14: K 4.1 Creatinine 0.62   Labs 10/28/2014: K 4.3 Creatinine 0.73  Labs 11/19/2014: K 3.6 Creatinine 0.64  Labs 6/16: digoxin 0.5 Labs 7/16: HIV negative, TSH normal Lipids 8/16: TC 206, TG 133, HDL 29, LDL 150 Labs 08/18/2015: 9.1  Labs 09/07/2015: K 4.4 Creating 0.68  Lab 12/14/2015: K 3.7 Creatinine 0.71   Review of systems complete and found to be negative unless listed in HPI.    Past Medical History:  Diagnosis Date  . AICD (automatic cardioverter/defibrillator) present 12/13/2016  . Anxiety    no meds  . Asthma   . CHF (congestive heart failure) ([place])   . Depression    no meds  . Diabetes mellitus    recent dx 11/17/11 - started med 11/18/11 type 2  . GERD (gastroesophageal reflux disease)   . Goiter 07/27/2011   non-neoplastic  goiter - fine needle aspiration - benign sees dr Dwyane Dee for  . Heart murmur    dx 2 yrs ago per pt  . Herpes genitalis in women   . Hypertension   . IBS (irritable bowel syndrome)    tx with diet per pt  . Low back pain    history  . Obesity   . Seasonal allergies   . Shortness of breath    occasional - exercise induced  . Sleep apnea    cpap broken  . Systolic heart failure    May 2011 EF 35-40%, 04/03/11 EF 25-30%, 06/27/11 EF 30-35%    Current Outpatient Prescriptions  Medication Sig Dispense Refill  . albuterol (PROVENTIL HFA;VENTOLIN  HFA) 108 (90 Base) MCG/ACT inhaler Inhale 1-2 puffs into the lungs every 6 (six) hours as needed for wheezing or shortness of breath. 1 Inhaler 0  . aspirin 81 MG chewable tablet Chew 81 mg by mouth daily.     . Aspirin-Acetaminophen-Caffeine (GOODY HEADACHE PO) Take 1 packet by mouth daily as needed (pain/headache).    Marland Kitchen atorvastatin (LIPITOR) 40 MG tablet Take 40 mg by mouth daily.    Marland Kitchen BAYER CONTOUR TEST test strip   1  . carvedilol (COREG) 25 MG tablet Take 1 tablet (25 mg total) by mouth 2 (two) times daily with a meal. 60 tablet 11  . estradiol (ESTRACE) 1 MG tablet Take 1 tablet (1 mg total) by mouth daily. 30 tablet 11  . fluticasone (FLONASE) 50 MCG/ACT nasal spray Place 1 spray into both nostrils 2 (two) times daily as needed for allergies or rhinitis. 16 g 3  . furosemide (LASIX) 40 MG tablet TAKE 1 TABLET (40 MG TOTAL) BY MOUTH 2 (TWO) TIMES DAILY. 60 tablet 3  . glimepiride (AMARYL) 2 MG tablet Take 2 mg by mouth daily.     Marland Kitchen levothyroxine (SYNTHROID, LEVOTHROID) 50 MCG tablet Take 1 tablet (50 mcg total) by mouth daily. 30 tablet 4  . loratadine (CLARITIN) 10 MG tablet Take 1 tablet (10 mg total) by mouth daily. 30 tablet 2  . OVER THE COUNTER MEDICATION Place 1 spray into both nostrils daily as needed (congestion). Generic Vicks twisthaler    . ranitidine (ZANTAC) 150 MG tablet Take 1 tablet (150 mg total) by mouth 2 (two) times daily. (Patient taking differently: Take 150 mg by mouth daily as needed for heartburn. ) 180 tablet 3  . sacubitril-valsartan (ENTRESTO) 49-51 MG Take 2 tablets by mouth 2 (two) times daily.    . sodium chloride (OCEAN) 0.65 % SOLN nasal spray Place 2 sprays into both nostrils daily as needed for congestion.    Marland Kitchen PRESCRIPTION MEDICATION Inhale into the lungs at bedtime. CPAP     No current facility-administered medications for this encounter.    Facility-Administered Medications Ordered in Other Encounters  Medication Dose Route Frequency Provider Last  Rate Last Dose  . aminophylline injection 150 mg  150 mg Intravenous BID PRN Larey Dresser, MD   150 mg at 12/24/14 1005     Allergies  Allergen Reactions  . Shrimp [Shellfish Allergy] Anaphylaxis    Social History   Social History  . Marital status: Divorced    Spouse name: N/A  . Number of children: Y  . Years of education: N/A   Occupational History  . 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 Smoking status: Never Smoker  . Smokeless tobacco: Never Used  . Alcohol  use No  . Drug use: No  . Sexual activity: No   Other Topics Concern  . Not on file   Social History Narrative   She lives with her husband and son.  She is works for [employer].    Family History  Problem Relation Age of Onset  . Heart disease Maternal Aunt   . Breast cancer Mother   . Thyroid disease Mother   . Hypertension Maternal Grandmother   . Diabetes Maternal Grandmother   . Breast cancer Maternal Aunt   . Heart disease Maternal Aunt   . Diabetes Maternal Grandfather   . Diabetes Paternal Grandmother   . Diabetes Paternal Grandfather   . Hypertension Paternal Grandfather     PHYSICAL EXAM: Vitals:   01/16/17 0908  BP: 110/74  Pulse: 77  SpO2: 96%  Weight: 187 lb 12.8 oz (85.2 kg)   Wt Readings from Last 3 Encounters:  01/16/17 187 lb 12.8 oz (85.2 kg)  12/14/16 184 lb 11.2 oz (83.8 kg)  11/07/16 182 lb (82.6 kg)   General: Well appearing. No resp difficulty. HEENT: Normal Neck: Supple. JVP 6-7 cm. Carotids 2+ bilat; no bruits. No thyromegaly or nodule noted. Cor: PMI nondisplaced. RRR, Split 2nd heart sound. No palpable S3.  Lungs: CTAB, normal effort. Abdomen: Soft, non-tender, non-distended, no HSM. No bruits or masses. +BS  Extremities: No cyanosis, clubbing, rash, R and LLE no edema.  Neuro: Alert & orientedx3, cranial nerves grossly intact. moves all 4 extremities w/o difficulty. Affect pleasant   ASSESSMENT & PLAN: 1. Chronic  systolic HF: Nonischemic cardiomyopathy.  08/2015 ECHO per Dr Haroldine Laws EF 35-40% (similar to cMRI).  S/p Medtronic CRT-D 12/13/16 - Echo 10/16/16 LVEF 20-25% - NYHA Class II symptoms.  - Volume status stable on exam.  - Continue lasix 40 mg BID - Continue coreg 25 mg BID.  - She has previously been intolerant of Bidil with headache.  - Continue Entresto 97/103 mg BID. BMET today.  - Intolerant to spiro with painful gynecomastia.  - OK to start Cardiac rehab.  2. Chest pressure - No chest pain.  - LHC 08/2015 with normal cors.   3. HTN:  - Stable on current meds.   4. OSA:  - Encouraged to wear CPAP.  5. Multinodular goiter:     - Follows with endocrinology. No change.  6. Hyperlipidemia:  - Continue atorvastatin 40 mg daily.   - LDL 115 in 10/2015.  - Reinforced diet restrictions.  7. DM II: followed by  Endocrinologist. She is being followed at [facility] for assistance with diabetes management. No change.  8. Acid reflux:  - No longer wants EGD. But symptoms have been well controlled.    Overall stable. Labs today. RTC 6 months.   Shirley Friar, PA-C  01/16/2017

## 2017-01-16 NOTE — Telephone Encounter (Signed)
Cardiac Rehab Medication Review by a Pharmacist  Does the patient  feel that his/her medications are working for him/her?  yes  Has the patient been experiencing any side effects to the medications prescribed?  no  Does the patient measure his/her own blood pressure or blood glucose at home?  yes   Does the patient have any problems obtaining medications due to transportation or finances?   no  Understanding of regimen: good Understanding of indications: good Potential of compliance: good    Pharmacist comments: 55 YOM who called back to discuss her medications prior to cardiac rehab. She was recently approved for Medicaid so her medications are much more affordable at this time. She doesn't have any other questions or complaints.   Myer Peer Grayland Ormond), PharmD  PGY1 Pharmacy Resident Pager: [phone] 01/16/2017 5:52 PM

## 2017-01-16 NOTE — Patient Instructions (Signed)
Routine lab work today. Will notify you of abnormal results, otherwise no news is good news!  No changes to medication at this time.  Follow up 6 months with Dr. Bensimhon. We will call you closer to this time, or you may call our office to schedule 1 month before you are due to be seen. Take all medication as prescribed the day of your appointment. Bring all medications with you to your appointment.  Do the following things EVERYDAY: 1) Weigh yourself in the morning before breakfast. Write it down and keep it in a log. 2) Take your medicines as prescribed 3) Eat low salt foods-Limit salt (sodium) to 2000 mg per day.  4) Stay as active as you can everyday 5) Limit all fluids for the day to less than 2 liters  

## 2017-01-16 NOTE — Progress Notes (Signed)
Advanced Heart Failure Medication Review by a Pharmacist  Does the patient  feel that his/her medications are working for him/her?  yes  Has the patient been experiencing any side effects to the medications prescribed?  no  Does the patient measure his/her own blood pressure or blood glucose at home?  no   Does the patient have any problems obtaining medications due to transportation or finances?   no  Understanding of regimen: good Understanding of indications: good Potential of compliance: good Patient understands to avoid NSAIDs. Patient understands to avoid decongestants.  Issues to address at subsequent visits: none.   Pharmacist comments: Mrs. Susan Peck is a pleasant 51 y/o female who presents without medication bottles or list but good understanding of her medication regimen. She endorses good adherence to her medications. Patient reports she was approved recently for Medicaid. Patient had no medication-related questions or concerns for me at this time.   [name] PharmD Candidate   Time with patient: 10 minutes Preparation and documentation time: 10 minutes Total time: 20 minutes

## 2017-01-18 ENCOUNTER — Telehealth (HOSPITAL_COMMUNITY): Payer: Self-pay | Admitting: *Deleted

## 2017-01-18 NOTE — Telephone Encounter (Signed)
Patient called triage line asking if she could get a disability parking tag with her condition.  I told her I would talk to Dr. Haroldine Laws and get back with her with his response.

## 2017-01-19 NOTE — Telephone Encounter (Signed)
Dr. Haroldine Laws has signed off for parking tag.  I have left at front desk for patient to pick up Tuesday July 3rd.  Patient is aware.

## 2017-01-22 ENCOUNTER — Telehealth (HOSPITAL_COMMUNITY): Payer: Self-pay

## 2017-01-22 NOTE — Telephone Encounter (Signed)
*  Updated insurance information* Medicaid - no co-payment, no deductible, no out of pocket, no co-insurance, no pre-authorization. Passport/reference [phone].  Per patient she no longer has BCBS only Medicaid.

## 2017-01-23 ENCOUNTER — Encounter (HOSPITAL_COMMUNITY)
Admission: RE | Admit: 2017-01-23 | Discharge: 2017-01-23 | Disposition: A | Discharge status: Home / Self Care | Payer: Medicaid Other | Source: Ambulatory Visit | Attending: Internal Medicine | Admitting: Internal Medicine

## 2017-01-23 VITALS — BP 108/60 | HR 78 | Ht 64.0 in | Wt 190.5 lb

## 2017-01-23 DIAGNOSIS — G4730 Sleep apnea, unspecified: Secondary | ICD-10-CM | POA: Insufficient documentation

## 2017-01-23 DIAGNOSIS — E669 Obesity, unspecified: Secondary | ICD-10-CM | POA: Diagnosis not present

## 2017-01-23 DIAGNOSIS — F419 Anxiety disorder, unspecified: Secondary | ICD-10-CM | POA: Insufficient documentation

## 2017-01-23 DIAGNOSIS — J302 Other seasonal allergic rhinitis: Secondary | ICD-10-CM | POA: Insufficient documentation

## 2017-01-23 DIAGNOSIS — K219 Gastro-esophageal reflux disease without esophagitis: Secondary | ICD-10-CM | POA: Diagnosis not present

## 2017-01-23 DIAGNOSIS — Z95810 Presence of automatic (implantable) cardiac defibrillator: Secondary | ICD-10-CM | POA: Diagnosis not present

## 2017-01-23 DIAGNOSIS — I5022 Chronic systolic (congestive) heart failure: Secondary | ICD-10-CM

## 2017-01-23 DIAGNOSIS — I110 Hypertensive heart disease with heart failure: Secondary | ICD-10-CM | POA: Diagnosis not present

## 2017-01-23 DIAGNOSIS — F3289 Other specified depressive episodes: Secondary | ICD-10-CM | POA: Diagnosis not present

## 2017-01-23 DIAGNOSIS — J45909 Unspecified asthma, uncomplicated: Secondary | ICD-10-CM | POA: Diagnosis not present

## 2017-01-23 DIAGNOSIS — K589 Irritable bowel syndrome without diarrhea: Secondary | ICD-10-CM | POA: Diagnosis not present

## 2017-01-23 DIAGNOSIS — I509 Heart failure, unspecified: Secondary | ICD-10-CM | POA: Diagnosis present

## 2017-01-23 NOTE — Progress Notes (Signed)
Cardiac Individual Treatment Plan  Patient Details  Name: ATHENA BALTZ MRN: [id number] Date of Birth: [birth year] Referring Provider:     CARDIAC REHAB PHASE II ORIENTATION from 01/23/2017 in [facility]  Referring Provider  Glori Bickers MD      Initial Encounter Date:    CARDIAC REHAB PHASE II ORIENTATION from 01/23/2017 in [place]  Date  01/23/17  Referring Provider  Glori Bickers MD      Visit Diagnosis: Chronic systolic congestive heart failure ([place])  Patient's Home Medications on Admission:  Current Outpatient Prescriptions:  .  albuterol (PROVENTIL HFA;VENTOLIN HFA) 108 (90 Base) MCG/ACT inhaler, Inhale 1-2 puffs into the lungs every 6 (six) hours as needed for wheezing or shortness of breath., Disp: 1 Inhaler, Rfl: 0 .  aspirin 81 MG chewable tablet, Chew 81 mg by mouth daily. , Disp: , Rfl:  .  Aspirin-Acetaminophen-Caffeine (GOODY HEADACHE PO), Take 1 packet by mouth daily as needed (pain/headache)., Disp: , Rfl:  .  atorvastatin (LIPITOR) 40 MG tablet, Take 40 mg by mouth daily., Disp: , Rfl:  .  BAYER CONTOUR TEST test strip, , Disp: , Rfl: 1 .  carvedilol (COREG) 25 MG tablet, Take 1 tablet (25 mg total) by mouth 2 (two) times daily with a meal., Disp: 60 tablet, Rfl: 11 .  estradiol (ESTRACE) 1 MG tablet, Take 1 tablet (1 mg total) by mouth daily., Disp: 30 tablet, Rfl: 11 .  fluticasone (FLONASE) 50 MCG/ACT nasal spray, Place 1 spray into both nostrils 2 (two) times daily as needed for allergies or rhinitis., Disp: 16 g, Rfl: 3 .  furosemide (LASIX) 40 MG tablet, TAKE 1 TABLET (40 MG TOTAL) BY MOUTH 2 (TWO) TIMES DAILY., Disp: 60 tablet, Rfl: 3 .  glimepiride (AMARYL) 2 MG tablet, Take 2 mg by mouth daily. , Disp: , Rfl:  .  levothyroxine (SYNTHROID, LEVOTHROID) 50 MCG tablet, Take 1 tablet (50 mcg total) by mouth daily., Disp: 30 tablet, Rfl: 4 .  loratadine (CLARITIN) 10 MG tablet, Take 1 tablet  (10 mg total) by mouth daily. (Patient not taking: Reported on 01/16/2017), Disp: 30 tablet, Rfl: 2 .  PRESCRIPTION MEDICATION, Inhale into the lungs at bedtime. CPAP, Disp: , Rfl:  .  ranitidine (ZANTAC) 150 MG tablet, Take 1 tablet (150 mg total) by mouth 2 (two) times daily. (Patient taking differently: Take 150 mg by mouth daily as needed for heartburn. ), Disp: 180 tablet, Rfl: 3 .  sacubitril-valsartan (ENTRESTO) 49-51 MG, Take 2 tablets by mouth 2 (two) times daily., Disp: , Rfl:  .  sodium chloride (OCEAN) 0.65 % SOLN nasal spray, Place 2 sprays into both nostrils daily as needed for congestion., Disp: , Rfl:  No current facility-administered medications for this encounter.   Facility-Administered Medications Ordered in Other Encounters:  .  aminophylline injection 150 mg, 150 mg, Intravenous, BID PRN, Larey Dresser, MD, 150 mg at 12/24/14 1005  Past Medical History: Past Medical History:  Diagnosis Date  . AICD (automatic cardioverter/defibrillator) present 12/13/2016  . Anxiety    no meds  . Asthma   . CHF (congestive heart failure) ([place])   . Depression    no meds  . Diabetes mellitus    recent dx 11/17/11 - started med 11/18/11 type 2  . GERD (gastroesophageal reflux disease)   . Goiter 07/27/2011   non-neoplastic goiter - fine needle aspiration - benign sees dr Dwyane Dee for  . Heart murmur  dx 2 yrs ago per pt  . Herpes genitalis in women   . Hypertension   . IBS (irritable bowel syndrome)    tx with diet per pt  . Low back pain    history  . Obesity   . Seasonal allergies   . Shortness of breath    occasional - exercise induced  . Sleep apnea    cpap broken  . Systolic heart failure    May 2011 EF 35-40%, 04/03/11 EF 25-30%, 06/27/11 EF 30-35%    Tobacco Use: History  Smoking Status  . Never Smoker  Smokeless Tobacco  . Never Used    Labs: Recent Review Flowsheet Data    Labs for ITP Cardiac and Pulmonary Rehab Latest Ref Rng & Units 09/15/2015 09/15/2015  11/09/2015 12/14/2015 04/10/2016   Cholestrol 0 - 200 mg/dL - - 192 - -   LDLCALC 0 - 99 mg/dL - - 115(H) - -   HDL >39.00 mg/dL - - 39.50 - -   Trlycerides 0.0 - 149.0 mg/dL - - 189.0(H) - -   Hemoglobin A1c - - - - 7.2(H) 7.3   HCO3 20.0 - 24.0 mEq/L 25.1(H) 26.7(H) - - -   TCO2 0 - 100 mmol/L 26 28 - - -   O2SAT % 71.0 71.0 - - -      Capillary Blood Glucose: Lab Results  Component Value Date   GLUCAP 164 (H) 12/13/2016   GLUCAP 198 (H) 04/10/2016   GLUCAP 138 (H) 12/28/2015   GLUCAP 284 (H) 09/15/2015   GLUCAP 295 (H) 09/15/2015     Exercise Target Goals: Date: 01/23/17  Exercise Program Goal: Individual exercise prescription set with THRR, safety & activity barriers. Participant demonstrates ability to understand and report RPE using BORG scale, to self-measure pulse accurately, and to acknowledge the importance of the exercise prescription.  Exercise Prescription Goal: Starting with aerobic activity 30 plus minutes a day, 3 days per week for initial exercise prescription. Provide home exercise prescription and guidelines that participant acknowledges understanding prior to discharge.  Activity Barriers & Risk Stratification:     Activity Barriers & Cardiac Risk Stratification - 01/23/17 0915      Activity Barriers & Cardiac Risk Stratification   Activity Barriers Deconditioning;Joint Problems;Back Problems;Other (comment)   Comments leg pain   Cardiac Risk Stratification High      6 Minute Walk:     6 Minute Walk    Row Name 01/23/17 0913 01/23/17 1140       6 Minute Walk   Phase Initial  -    Distance 1770 feet  -    Walk Time 6 minutes  -    # of Rest Breaks 0  -    MPH  - 3.35    METS  - 4.26    RPE 12  -    VO2 Peak  - 14.9    Symptoms Yes (comment)  -    Comments fatigue! Lower leg tightness(calf region)  -    Resting HR 78 bpm  -    Resting BP 108/60  -    Max Ex. HR 92 bpm  -    Max Ex. BP 118/64  -    2 Minute Post BP  - 108/72        Oxygen Initial Assessment:   Oxygen Re-Evaluation:   Oxygen Discharge (Final Oxygen Re-Evaluation):   Initial Exercise Prescription:     Initial Exercise Prescription - 01/23/17 1100  Date of Initial Exercise RX and Referring Provider   Date 01/23/17   Referring Provider Glori Bickers MD     Treadmill   MPH 2.5   Grade 1   Minutes 10   METs 3.26     Bike   Level 0.8   Minutes 10   METs 2.78     NuStep   Level 3   SPM 80   Minutes 10   METs 2     Prescription Details   Frequency (times per week) 3   Duration Progress to 30 minutes of continuous aerobic without signs/symptoms of physical distress     Intensity   THRR 40-80% of Max Heartrate 68-135   Ratings of Perceived Exertion 11-15   Perceived Dyspnea 0-4     Progression   Progression Continue to progress workloads to maintain intensity without signs/symptoms of physical distress.     Resistance Training   Training Prescription Yes   Weight 3lbs   Reps 10-15      Perform Capillary Blood Glucose checks as needed.  Exercise Prescription Changes:   Exercise Comments:   Exercise Goals and Review:     Exercise Goals    Row Name 01/23/17 0916             Exercise Goals   Increase Physical Activity Yes       Intervention Provide advice, education, support and counseling about physical activity/exercise needs.;Develop an individualized exercise prescription for aerobic and resistive training based on initial evaluation findings, risk stratification, comorbidities and participant's personal goals.       Expected Outcomes Achievement of increased cardiorespiratory fitness and enhanced flexibility, muscular endurance and strength shown through measurements of functional capacity and personal statement of participant.       Increase Strength and Stamina Yes       Intervention Provide advice, education, support and counseling about physical activity/exercise needs.;Develop an  individualized exercise prescription for aerobic and resistive training based on initial evaluation findings, risk stratification, comorbidities and participant's personal goals.       Expected Outcomes Achievement of increased cardiorespiratory fitness and enhanced flexibility, muscular endurance and strength shown through measurements of functional capacity and personal statement of participant.          Exercise Goals Re-Evaluation :    Discharge Exercise Prescription (Final Exercise Prescription Changes):   Nutrition:  Target Goals: Understanding of nutrition guidelines, daily intake of sodium 1500mg , cholesterol 200mg , calories 30% from fat and 7% or less from saturated fats, daily to have 5 or more servings of fruits and vegetables.  Biometrics:     Pre Biometrics - 01/23/17 0932      Pre Biometrics   Waist Circumference 40 inches   Hip Circumference 42.5 inches   Waist to Hip Ratio 0.94 %   Triceps Skinfold 46 mm   Grip Strength 34 kg   Flexibility 14 in   Single Leg Stand 6.31 seconds         Post Biometrics - 01/23/17 1144       Post  Biometrics   % Body Fat 44.9 %      Nutrition Therapy Plan and Nutrition Goals:   Nutrition Discharge: Nutrition Scores:   Nutrition Goals Re-Evaluation:   Nutrition Goals Re-Evaluation:   Nutrition Goals Discharge (Final Nutrition Goals Re-Evaluation):   Psychosocial: Target Goals: Acknowledge presence or absence of significant depression and/or stress, maximize coping skills, provide positive support system. Participant is able to verbalize types and ability to use techniques  and skills needed for reducing stress and depression.  Initial Review & Psychosocial Screening:     Initial Psych Review & Screening - 01/23/17 1125      Initial Review   Current issues with Current Stress Concerns;Current Anxiety/Panic   Source of Stress Concerns Chronic Illness;Poor Coping Skills;Family;Financial   Comments pt with  many stressors due to recent cardiac events and family situation.       Family Dynamics   Good Support System? No   Strains Intra-family strains   Concerns No support system   Comments pt has current conflict in her family unit. pt also experiencing break up with her boyfriend.  pt admits to feeling isolated and alone. pt demonstrates strong faith base but no church or social resources for support.      Barriers   Psychosocial barriers to participate in program The patient should benefit from training in stress management and relaxation.;Psychosocial barriers identified (see note)  pt with multiple barriers including financial, transportation and lack of support system. pt offered emotional support and reassurance..       Screening Interventions   Interventions Encouraged to exercise;To provide support and resources with identified psychosocial needs;Program counselor consult  pt offered appt with Jeanella Craze and to schedule follow up with her counselor Jonita Albee.        Quality of Life Scores:     Quality of Life - 01/23/17 1131      Quality of Life Scores   Health/Function Pre 10.4 %  pt with multiple concerns about her health and disability.     Socioeconomic Pre 10.5 %  pt without financial resources awaiting disability.  pt having difficulty paying  bills.    Psych/Spiritual Pre 17.14 %  pt demonstrates strong faith base however facing many overwhelming life circumstances. pt reports active prayer life however is not connected to church for support.      Family Pre 6 %  pt has current conflict with her adult children, boyfriend and other relatives. pt feels isolated and withdrawn.    GLOBAL Pre 11.14 %  pt exhibits symptoms of poor coping strategies and being overwhelmed with current life stressors. pt accepting of offer to schedule appt with Jeanella Craze and her therapist Jonita Albee.       PHQ-9: Recent Review Flowsheet Data    Depression screen [facility] 2/9 09/01/2016  08/23/2016 06/30/2016 04/10/2016 01/18/2016   Decreased Interest 1 0 1 0 0   Down, Depressed, Hopeless 1 1 1  0 1   PHQ - 2 Score 2 1 2  0 1   Altered sleeping 1  3  3   - -   Tired, decreased energy 0 3 3 - -   Change in appetite 0 1 1 - -   Feeling bad or failure about yourself  0 2 1 - -   Trouble concentrating 0 1 1 - -   Moving slowly or fidgety/restless 0 0  1  - -   Suicidal thoughts 0 0 0 - -   PHQ-9 Score 3 11 12  - -   Difficult doing work/chores Somewhat difficult Somewhat difficult Somewhat difficult - -     Interpretation of Total Score  Total Score Depression Severity:  1-4 = Minimal depression, 5-9 = Mild depression, 10-14 = Moderate depression, 15-19 = Moderately severe depression, 20-27 = Severe depression   Psychosocial Evaluation and Intervention:   Psychosocial Re-Evaluation:   Psychosocial Discharge (Final Psychosocial Re-Evaluation):   Vocational Rehabilitation: Provide vocational rehab assistance to  qualifying candidates.   Vocational Rehab Evaluation & Intervention:     Vocational Rehab - 01/23/17 1151      Initial Vocational Rehab Evaluation & Intervention   Assessment shows need for Vocational Rehabilitation No      Education: Education Goals: Education classes will be provided on a weekly basis, covering required topics. Participant will state understanding/return demonstration of topics presented.  Learning Barriers/Preferences:     Learning Barriers/Preferences - 01/23/17 0914      Learning Barriers/Preferences   Learning Barriers Sight   Learning Preferences Written Material;Pictoral;Video      Education Topics: Count Your Pulse:  -Group instruction provided by verbal instruction, demonstration, patient participation and written materials to support subject.  Instructors address importance of being able to find your pulse and how to count your pulse when at home without a heart monitor.  Patients get hands on experience counting their  pulse with staff help and individually.   Heart Attack, Angina, and Risk Factor Modification:  -Group instruction provided by verbal instruction, video, and written materials to support subject.  Instructors address signs and symptoms of angina and heart attacks.    Also discuss risk factors for heart disease and how to make changes to improve heart health risk factors.   Functional Fitness:  -Group instruction provided by verbal instruction, demonstration, patient participation, and written materials to support subject.  Instructors address safety measures for doing things around the house.  Discuss how to get up and down off the floor, how to pick things up properly, how to safely get out of a chair without assistance, and balance training.   Meditation and Mindfulness:  -Group instruction provided by verbal instruction, patient participation, and written materials to support subject.  Instructor addresses importance of mindfulness and meditation practice to help reduce stress and improve awareness.  Instructor also leads participants through a meditation exercise.    Stretching for Flexibility and Mobility:  -Group instruction provided by verbal instruction, patient participation, and written materials to support subject.  Instructors lead participants through series of stretches that are designed to increase flexibility thus improving mobility.  These stretches are additional exercise for major muscle groups that are typically performed during regular warm up and cool down.   Hands Only CPR:  -Group verbal, video, and participation provides a basic overview of AHA guidelines for community CPR. Role-play of emergencies allow participants the opportunity to practice calling for help and chest compression technique with discussion of AED use.   Hypertension: -Group verbal and written instruction that provides a basic overview of hypertension including the most recent diagnostic guidelines,  risk factor reduction with self-care instructions and medication management.    Nutrition I class: Heart Healthy Eating:  -Group instruction provided by PowerPoint slides, verbal discussion, and written materials to support subject matter. The instructor gives an explanation and review of the Therapeutic Lifestyle Changes diet recommendations, which includes a discussion on lipid goals, dietary fat, sodium, fiber, plant stanol/sterol esters, sugar, and the components of a well-balanced, healthy diet.   Nutrition II class: Lifestyle Skills:  -Group instruction provided by PowerPoint slides, verbal discussion, and written materials to support subject matter. The instructor gives an explanation and review of label reading, grocery shopping for heart health, heart healthy recipe modifications, and ways to make healthier choices when eating out.   Diabetes Question & Answer:  -Group instruction provided by PowerPoint slides, verbal discussion, and written materials to support subject matter. The instructor gives an explanation and review of diabetes  co-morbidities, pre- and post-prandial blood glucose goals, pre-exercise blood glucose goals, signs, symptoms, and treatment of hypoglycemia and hyperglycemia, and foot care basics.   Diabetes Blitz:  -Group instruction provided by PowerPoint slides, verbal discussion, and written materials to support subject matter. The instructor gives an explanation and review of the physiology behind type 1 and type 2 diabetes, diabetes medications and rational behind using different medications, pre- and post-prandial blood glucose recommendations and Hemoglobin A1c goals, diabetes diet, and exercise including blood glucose guidelines for exercising safely.    Portion Distortion:  -Group instruction provided by PowerPoint slides, verbal discussion, written materials, and food models to support subject matter. The instructor gives an explanation of serving size versus  portion size, changes in portions sizes over the last 20 years, and what consists of a serving from each food group.   Stress Management:  -Group instruction provided by verbal instruction, video, and written materials to support subject matter.  Instructors review role of stress in heart disease and how to cope with stress positively.     Exercising on Your Own:  -Group instruction provided by verbal instruction, power point, and written materials to support subject.  Instructors discuss benefits of exercise, components of exercise, frequency and intensity of exercise, and end points for exercise.  Also discuss use of nitroglycerin and activating EMS.  Review options of places to exercise outside of rehab.  Review guidelines for sex with heart disease.   Cardiac Drugs I:  -Group instruction provided by verbal instruction and written materials to support subject.  Instructor reviews cardiac drug classes: antiplatelets, anticoagulants, beta blockers, and statins.  Instructor discusses reasons, side effects, and lifestyle considerations for each drug class.   Cardiac Drugs II:  -Group instruction provided by verbal instruction and written materials to support subject.  Instructor reviews cardiac drug classes: angiotensin converting enzyme inhibitors (ACE-I), angiotensin II receptor blockers (ARBs), nitrates, and calcium channel blockers.  Instructor discusses reasons, side effects, and lifestyle considerations for each drug class.   Anatomy and Physiology of the Circulatory System:  Group verbal and written instruction and models provide basic cardiac anatomy and physiology, with the coronary electrical and arterial systems. Review of: AMI, Angina, Valve disease, Heart Failure, Peripheral Artery Disease, Cardiac Arrhythmia, Pacemakers, and the ICD.   Other Education:  -Group or individual verbal, written, or video instructions that support the educational goals of the cardiac rehab  program.   Knowledge Questionnaire Score:     Knowledge Questionnaire Score - 01/23/17 0901      Knowledge Questionnaire Score   Pre Score 21/24      Core Components/Risk Factors/Patient Goals at Admission:     Personal Goals and Risk Factors at Admission - 01/23/17 0933      Core Components/Risk Factors/Patient Goals on Admission   Hypertension Yes   Intervention Provide education on lifestyle modifcations including regular physical activity/exercise, weight management, moderate sodium restriction and increased consumption of fresh fruit, vegetables, and low fat dairy, alcohol moderation, and smoking cessation.;Monitor prescription use compliance.   Expected Outcomes Short Term: Continued assessment and intervention until BP is < 140/47mm HG in hypertensive participants. < 130/17mm HG in hypertensive participants with diabetes, heart failure or chronic kidney disease.;Long Term: Maintenance of blood pressure at goal levels.   Stress Yes   Intervention Offer individual and/or small group education and counseling on adjustment to heart disease, stress management and health-related lifestyle change. Teach and support self-help strategies.;Refer participants experiencing significant psychosocial distress to appropriate mental health specialists for  further evaluation and treatment. When possible, include family members and significant others in education/counseling sessions.   Expected Outcomes Short Term: Participant demonstrates changes in health-related behavior, relaxation and other stress management skills, ability to obtain effective social support, and compliance with psychotropic medications if prescribed.;Long Term: Emotional wellbeing is indicated by absence of clinically significant psychosocial distress or social isolation.      Core Components/Risk Factors/Patient Goals Review:    Core Components/Risk Factors/Patient Goals at Discharge (Final Review):    ITP Comments:      ITP Comments    Row Name 01/23/17 0856           ITP Comments Dr. Fransico Him, Medical Director           Comments: Patient attended orientation from [phone] to 1026  to review rules and guidelines for program. Completed 6 minute walk test, Intitial ITP, and exercise prescription.  VSS. Telemetry-sinus rhythm, bundle branch block.   Asymptomatic.

## 2017-01-23 NOTE — Progress Notes (Signed)
Susan Peck 52 y.o. female       Nutrition Screen & Note  1. Chronic systolic congestive heart failure (HCC)    Past Medical History:  Diagnosis Date  . AICD (automatic cardioverter/defibrillator) present 12/13/2016  . Anxiety    no meds  . Asthma   . CHF (congestive heart failure) ([place])   . Depression    no meds  . Diabetes mellitus    recent dx 11/17/11 - started med 11/18/11 type 2  . GERD (gastroesophageal reflux disease)   . Goiter 07/27/2011   non-neoplastic goiter - fine needle aspiration - benign sees dr Dwyane Dee for  . Heart murmur    dx 2 yrs ago per pt  . Herpes genitalis in women   . Hypertension   . IBS (irritable bowel syndrome)    tx with diet per pt  . Low back pain    history  . Obesity   . Seasonal allergies   . Shortness of breath    occasional - exercise induced  . Sleep apnea    cpap broken  . Systolic heart failure    May 2011 EF 35-40%, 04/03/11 EF 25-30%, 06/27/11 EF 30-35%   Meds reviewed. Amaryl noted  HT: Ht Readings from Last 1 Encounters:  01/23/17 5\' 4"  (1.626 m)    WT: Wt Readings from Last 3 Encounters:  01/23/17 190 lb 7.6 oz (86.4 kg)  01/16/17 187 lb 12.8 oz (85.2 kg)  12/14/16 184 lb 11.2 oz (83.8 kg)     BMI 32.8   Current tobacco use? No  Labs:  Lipid Panel     Component Value Date/Time   CHOL 192 11/09/2015 1147   TRIG 189.0 (H) 11/09/2015 1147   HDL 39.50 11/09/2015 1147   CHOLHDL 5 11/09/2015 1147   VLDL 37.8 11/09/2015 1147   LDLCALC 115 (H) 11/09/2015 1147    Lab Results  Component Value Date   HGBA1C 7.3 04/10/2016   CBG (last 3)  No results for input(s): GLUCAP in the last 72 hours.  Nutrition Note Spoke with pt. Nutrition Plan and goals reviewed with pt. Pt wants to lose wt. Pt has been trying to lose wt by cutting back on portion sizes. Pt feels a meal pattern would help her in her effort to lose wt. Pt is diabetic. Pt states "it's surprising I'm diabetic since I don't eat a lot of sweets." Etiology  of DM discussed. Last A1c indicates blood glucose not optimally controlled. Pt expressed understanding of the information reviewed. Pt aware of nutrition education classes offered and plans on attending nutrition classes.  Nutrition Diagnosis ? Food-and nutrition-related knowledge deficit related to lack of exposure to information as related to diagnosis of: ? CVD ? DM ? Obesity related to excessive energy intake as evidenced by a BMI of 32.8  Nutrition Intervention ? Pt's individual nutrition plan reviewed with pt.  Nutrition Goal(s):  ? Improved glycemic control as evidenced by pt's A1c trending from 7.3 to less than 7 ? Pt to identify food quantities necessary to achieve weight loss of 6-24 lb (2.7-10.9 kg) at graduation from cardiac rehab. Goal wt of 165-175 lb desired.  Plan:  Will assist pt with meal plan to help with wt loss Help pt better understand how to combine the Heart Healthy and DM diets. Pt to attend nutrition classes ? Nutrition I ? Nutrition II ? Portion Distortion  ? Diabetes Blitz ? Diabetes Q & A Will provide client-centered nutrition education as part of interdisciplinary  care.   Monitor and evaluate progress toward nutrition goal with team.  Derek Mound, M.Ed, RD, LDN, CDE 01/23/2017 1:27 PM

## 2017-01-25 ENCOUNTER — Telehealth: Payer: Self-pay | Admitting: Endocrinology

## 2017-01-25 ENCOUNTER — Telehealth: Payer: Self-pay | Admitting: Cardiology

## 2017-01-25 LAB — HM DIABETES EYE EXAM

## 2017-01-25 NOTE — Telephone Encounter (Signed)
Spoke with Susan Peck informed her that her transmission was received and it was normal, she had not had any episodes where she had received therapy, informed her that because the device was fairly new she could be having some nerve pain. Ms Susan Peck stated that she had been under a lot of stress lately, informed her that stress could exacerbate there sort of sensations. Susan Peck voiced understanding.

## 2017-01-25 NOTE — Telephone Encounter (Signed)
Pt called and stated that she believes that she is getting small shocking sensations around her device site. Instructed pt to send a manual transmission w/ her home monitor. Informed pt once remote transmission is received a Device Tech RN will call her back w/ results. Pt verbalized understanding.

## 2017-01-26 ENCOUNTER — Telehealth: Payer: Self-pay | Admitting: Cardiology

## 2017-01-26 NOTE — Telephone Encounter (Signed)
Okay to schedule

## 2017-01-26 NOTE — Telephone Encounter (Signed)
This pt is established with Dr. Dwyane Dee but has not been seen in just over a year, she is now MCD, is it ok to schedule?

## 2017-01-26 NOTE — Telephone Encounter (Signed)
Will forward to Dr. Caryl Comes to review- what is the recommendation regarding her mammogram and device?  Implanted 12/13/16.

## 2017-01-26 NOTE — Telephone Encounter (Signed)
Pt called and stated that she is due to have her mammogram. Pt stated that she is nervous about having it done b/c it hurt in the previous years and she fears that it will be worse since have the ICD placed. she stated that the place she has her mammogram done said she could have it done another way if she had a referral from her MD. Informed pt that I would send a message to MD nurse and have her call her back. Pt verbalized understanding.

## 2017-01-29 ENCOUNTER — Other Ambulatory Visit (HOSPITAL_COMMUNITY): Payer: Self-pay | Admitting: Pharmacist

## 2017-01-29 ENCOUNTER — Encounter (HOSPITAL_COMMUNITY)
Admission: RE | Admit: 2017-01-29 | Discharge: 2017-01-29 | Disposition: A | Discharge status: Home / Self Care | Payer: Medicaid Other | Source: Ambulatory Visit | Attending: Internal Medicine | Admitting: Internal Medicine

## 2017-01-29 ENCOUNTER — Encounter (HOSPITAL_COMMUNITY): Payer: Self-pay

## 2017-01-29 DIAGNOSIS — I110 Hypertensive heart disease with heart failure: Secondary | ICD-10-CM | POA: Diagnosis not present

## 2017-01-29 DIAGNOSIS — I5022 Chronic systolic (congestive) heart failure: Secondary | ICD-10-CM

## 2017-01-29 LAB — GLUCOSE, CAPILLARY
Glucose-Capillary: 134 mg/dL — ABNORMAL HIGH (ref 65–99)
Glucose-Capillary: 220 mg/dL — ABNORMAL HIGH (ref 65–99)

## 2017-01-29 MED ORDER — SACUBITRIL-VALSARTAN 97-103 MG PO TABS: 1.0000 | ORAL_TABLET | Freq: Two times a day (BID) | ORAL | 11 refills | Status: DC

## 2017-01-29 NOTE — Progress Notes (Addendum)
Daily Session Note  Patient Details  Name: Susan Peck MRN: [id number] Date of Birth: Jul 18, 1966 Referring Provider:     CARDIAC REHAB PHASE II ORIENTATION from 01/23/2017 in [place]  Referring Provider  Glori Bickers MD      Encounter Date: 01/29/2017  Check In:     Session Check In - 01/29/17 0917      Check-In   Location [facility]   Staff Present Cleda Mccreedy, MS, Exercise Physiologist;Carlette Wilber Oliphant, RN, BSN;Amber Fair, MS, ACSM RCEP, Exercise Physiologist;Adelbert Gaspard, RN, Luisa Hart, RN, BSN   Supervising physician immediately available to respond to emergencies [facility] immediately available   Physician(s) Dr. Doyle Askew    Medication changes reported     No   Fall or balance concerns reported    No   Tobacco Cessation No Change   Warm-up and Cool-down Performed as group-led instruction   Resistance Training Performed Yes   VAD Patient? No     Pain Assessment   Currently in Pain? No/denies      Capillary Blood Glucose: Results for orders placed or performed during the hospital encounter of 01/29/17 (from the past 24 hour(s))  Glucose, capillary     Status: Abnormal   Collection Time: 01/29/17  8:31 AM  Result Value Ref Range   Glucose-Capillary 220 (H) 65 - 99 mg/dL  Glucose, capillary     Status: Abnormal   Collection Time: 01/29/17  9:27 AM  Result Value Ref Range   Glucose-Capillary 134 (H) 65 - 99 mg/dL      History  Smoking Status  . Never Smoker  Smokeless Tobacco  . Never Used    Goals Met:  Exercise tolerated well  Goals Unmet:  Not Applicable  Comments: Pt started cardiac rehab today.  Pt tolerated light exercise without difficulty. VSS, telemetry-sinus rhythm, , asymptomatic.  Medication list reconciled. Pt denies barriers to medicaiton compliance.  Pt states she is currently out of entresto but is going to CHF clinic today for refill.    PSYCHOSOCIAL ASSESSMENT:   PHQ-15 . Pt exhibits health related anxiety, situational stress, and depression.  appt made with Jeanella Craze and pt therapist.  PHQ9 and QOL scores forwarded to PCP for evaluation.   Pt enjoys listening to music.  Pt goals for cardiac rehab are to increase strength/stamina and lose weight.   Pt oriented to exercise equipment and routine.    Understanding verbalized.   Dr. Fransico Him is Medical Director for Cardiac Rehab at [facility].

## 2017-01-29 NOTE — Telephone Encounter (Signed)
Most women get it done with device in place.  If there is something better and easier I am glad to write a letter to that effect

## 2017-01-30 ENCOUNTER — Encounter: Payer: Self-pay | Admitting: *Deleted

## 2017-01-30 NOTE — Telephone Encounter (Signed)
Reviewed with Dr. Caryl Comes. Letter done stating the patient is requesting a breast ultrasound study in place of her mammogram. I have notified the patient of the letter, but also advised her if an order needs to be obtained then this will need to be done through her PCP. She verbalizes understanding.   Letter faxed to [facility] at [phone]. Confirmation received.

## 2017-01-30 NOTE — Telephone Encounter (Signed)
I called and spoke with the patient. I advised her of Dr. Olin Pia recommendations regarding mammogram and ICD. She states her mammograms always hurt and she is wanting to avoid this if possible and have an ultrasound done instead. I advised Dr. Caryl Comes could write a letter to that effect, but told her we would not actually be ordering the test. Inquired who follows her mammograms and she was uncertain. I advised her that if she had an ultrasound done and this by chance came back abnormal, it would be important to know who would follow this for her. Advised I will review with Dr. Caryl Comes and call her back.

## 2017-01-31 ENCOUNTER — Encounter (HOSPITAL_COMMUNITY)
Admission: RE | Admit: 2017-01-31 | Discharge: 2017-01-31 | Disposition: A | Discharge status: Home / Self Care | Payer: Medicaid Other | Source: Ambulatory Visit | Attending: Internal Medicine | Admitting: Internal Medicine

## 2017-01-31 DIAGNOSIS — I110 Hypertensive heart disease with heart failure: Secondary | ICD-10-CM | POA: Diagnosis not present

## 2017-01-31 DIAGNOSIS — I5022 Chronic systolic (congestive) heart failure: Secondary | ICD-10-CM

## 2017-01-31 LAB — GLUCOSE, CAPILLARY
Glucose-Capillary: 159 mg/dL — ABNORMAL HIGH (ref 65–99)
Glucose-Capillary: 178 mg/dL — ABNORMAL HIGH (ref 65–99)

## 2017-02-01 ENCOUNTER — Other Ambulatory Visit: Payer: Self-pay

## 2017-02-01 DIAGNOSIS — R5381 Other malaise: Secondary | ICD-10-CM

## 2017-02-02 ENCOUNTER — Encounter (HOSPITAL_COMMUNITY)
Admission: RE | Admit: 2017-02-02 | Discharge: 2017-02-02 | Disposition: A | Discharge status: Home / Self Care | Payer: Medicaid Other | Source: Ambulatory Visit | Attending: Internal Medicine | Admitting: Internal Medicine

## 2017-02-02 DIAGNOSIS — I5022 Chronic systolic (congestive) heart failure: Secondary | ICD-10-CM

## 2017-02-02 DIAGNOSIS — I110 Hypertensive heart disease with heart failure: Secondary | ICD-10-CM | POA: Diagnosis not present

## 2017-02-02 LAB — GLUCOSE, CAPILLARY: Glucose-Capillary: 179 mg/dL — ABNORMAL HIGH (ref 65–99)

## 2017-02-05 ENCOUNTER — Encounter (HOSPITAL_COMMUNITY)
Admission: RE | Admit: 2017-02-05 | Discharge: 2017-02-05 | Disposition: A | Discharge status: Home / Self Care | Payer: Medicaid Other | Source: Ambulatory Visit | Attending: Internal Medicine | Admitting: Internal Medicine

## 2017-02-05 ENCOUNTER — Telehealth (HOSPITAL_COMMUNITY): Payer: Self-pay | Admitting: Pharmacist

## 2017-02-05 DIAGNOSIS — I110 Hypertensive heart disease with heart failure: Secondary | ICD-10-CM | POA: Diagnosis not present

## 2017-02-05 DIAGNOSIS — I5022 Chronic systolic (congestive) heart failure: Secondary | ICD-10-CM

## 2017-02-05 LAB — GLUCOSE, CAPILLARY: GLUCOSE-CAPILLARY: 206 mg/dL — AB (ref 65–99)

## 2017-02-05 NOTE — Telephone Encounter (Signed)
Entresto PA approved by [place] [employer] through 01/24/18.   Ruta Hinds. Velva Harman, PharmD, BCPS, CPP Clinical Pharmacist Pager: [phone] Phone: [phone] 02/05/2017 9:00 AM

## 2017-02-06 ENCOUNTER — Telehealth (HOSPITAL_COMMUNITY): Payer: Self-pay | Admitting: *Deleted

## 2017-02-06 NOTE — Progress Notes (Signed)
Cardiac Individual Treatment Plan  Patient Details  Name: TRACEE MCCREERY MRN: [id number] Date of Birth: 05-Sep-1965 Referring Provider:     CARDIAC REHAB PHASE II ORIENTATION from 01/23/2017 in [place]  Referring Provider  Glori Bickers MD      Initial Encounter Date:    CARDIAC REHAB PHASE II ORIENTATION from 01/23/2017 in [place]  Date  01/23/17  Referring Provider  Glori Bickers MD      Visit Diagnosis: Chronic systolic congestive heart failure ([place])  Patient's Home Medications on Admission:  Current Outpatient Prescriptions:  .  albuterol (PROVENTIL HFA;VENTOLIN HFA) 108 (90 Base) MCG/ACT inhaler, Inhale 1-2 puffs into the lungs every 6 (six) hours as needed for wheezing or shortness of breath., Disp: 1 Inhaler, Rfl: 0 .  aspirin 81 MG chewable tablet, Chew 81 mg by mouth daily. , Disp: , Rfl:  .  Aspirin-Acetaminophen-Caffeine (GOODY HEADACHE PO), Take 1 packet by mouth daily as needed (pain/headache)., Disp: , Rfl:  .  atorvastatin (LIPITOR) 40 MG tablet, Take 40 mg by mouth daily., Disp: , Rfl:  .  BAYER CONTOUR TEST test strip, , Disp: , Rfl: 1 .  carvedilol (COREG) 25 MG tablet, Take 1 tablet (25 mg total) by mouth 2 (two) times daily with a meal., Disp: 60 tablet, Rfl: 11 .  estradiol (ESTRACE) 1 MG tablet, Take 1 tablet (1 mg total) by mouth daily., Disp: 30 tablet, Rfl: 11 .  fluticasone (FLONASE) 50 MCG/ACT nasal spray, Place 1 spray into both nostrils 2 (two) times daily as needed for allergies or rhinitis., Disp: 16 g, Rfl: 3 .  furosemide (LASIX) 40 MG tablet, TAKE 1 TABLET (40 MG TOTAL) BY MOUTH 2 (TWO) TIMES DAILY., Disp: 60 tablet, Rfl: 3 .  glimepiride (AMARYL) 2 MG tablet, Take 2 mg by mouth daily. , Disp: , Rfl:  .  levothyroxine (SYNTHROID, LEVOTHROID) 50 MCG tablet, Take 1 tablet (50 mcg total) by mouth daily., Disp: 30 tablet, Rfl: 4 .  loratadine (CLARITIN) 10 MG tablet, Take 1 tablet  (10 mg total) by mouth daily., Disp: 30 tablet, Rfl: 2 .  PRESCRIPTION MEDICATION, Inhale into the lungs at bedtime. CPAP, Disp: , Rfl:  .  ranitidine (ZANTAC) 150 MG tablet, Take 1 tablet (150 mg total) by mouth 2 (two) times daily. (Patient taking differently: Take 150 mg by mouth daily as needed for heartburn. ), Disp: 180 tablet, Rfl: 3 .  sacubitril-valsartan (ENTRESTO) 97-103 MG, Take 1 tablet by mouth 2 (two) times daily., Disp: 60 tablet, Rfl: 11 .  sodium chloride (OCEAN) 0.65 % SOLN nasal spray, Place 2 sprays into both nostrils daily as needed for congestion., Disp: , Rfl:  No current facility-administered medications for this encounter.   Facility-Administered Medications Ordered in Other Encounters:  .  aminophylline injection 150 mg, 150 mg, Intravenous, BID PRN, Larey Dresser, MD, 150 mg at 12/24/14 1005  Past Medical History: Past Medical History:  Diagnosis Date  . AICD (automatic cardioverter/defibrillator) present 12/13/2016  . Anxiety    no meds  . Asthma   . CHF (congestive heart failure) ([place])   . Depression    no meds  . Diabetes mellitus    recent dx 11/17/11 - started med 11/18/11 type 2  . GERD (gastroesophageal reflux disease)   . Goiter 07/27/2011   non-neoplastic goiter - fine needle aspiration - benign sees dr Dwyane Dee for  . Heart murmur    dx 2 yrs ago per  pt  . Herpes genitalis in women   . Hypertension   . IBS (irritable bowel syndrome)    tx with diet per pt  . Low back pain    history  . Obesity   . Seasonal allergies   . Shortness of breath    occasional - exercise induced  . Sleep apnea    cpap broken  . Systolic heart failure    May 2011 EF 35-40%, 04/03/11 EF 25-30%, 06/27/11 EF 30-35%    Tobacco Use: History  Smoking Status  . Never Smoker  Smokeless Tobacco  . Never Used    Labs: Recent Review Flowsheet Data    Labs for ITP Cardiac and Pulmonary Rehab Latest Ref Rng & Units 09/15/2015 09/15/2015 11/09/2015 12/14/2015 04/10/2016    Cholestrol 0 - 200 mg/dL - - 192 - -   LDLCALC 0 - 99 mg/dL - - 115(H) - -   HDL >39.00 mg/dL - - 39.50 - -   Trlycerides 0.0 - 149.0 mg/dL - - 189.0(H) - -   Hemoglobin A1c - - - - 7.2(H) 7.3   HCO3 20.0 - 24.0 mEq/L 25.1(H) 26.7(H) - - -   TCO2 0 - 100 mmol/L 26 28 - - -   O2SAT % 71.0 71.0 - - -      Capillary Blood Glucose: Lab Results  Component Value Date   GLUCAP 206 (H) 02/05/2017   GLUCAP 179 (H) 02/02/2017   GLUCAP 159 (H) 01/31/2017   GLUCAP 178 (H) 01/31/2017   GLUCAP 134 (H) 01/29/2017     Exercise Target Goals:    Exercise Program Goal: Individual exercise prescription set with THRR, safety & activity barriers. Participant demonstrates ability to understand and report RPE using BORG scale, to self-measure pulse accurately, and to acknowledge the importance of the exercise prescription.  Exercise Prescription Goal: Starting with aerobic activity 30 plus minutes a day, 3 days per week for initial exercise prescription. Provide home exercise prescription and guidelines that participant acknowledges understanding prior to discharge.  Activity Barriers & Risk Stratification:     Activity Barriers & Cardiac Risk Stratification - 01/23/17 0915      Activity Barriers & Cardiac Risk Stratification   Activity Barriers Deconditioning;Joint Problems;Back Problems;Other (comment)   Comments leg pain   Cardiac Risk Stratification High      6 Minute Walk:     6 Minute Walk    Row Name 01/23/17 0913 01/23/17 1140       6 Minute Walk   Phase Initial  -    Distance 1770 feet  -    Walk Time 6 minutes  -    # of Rest Breaks 0  -    MPH  - 3.35    METS  - 4.26    RPE 12  -    VO2 Peak  - 14.9    Symptoms Yes (comment)  -    Comments fatigue! Lower leg tightness(calf region)  -    Resting HR 78 bpm  -    Resting BP 108/60  -    Max Ex. HR 92 bpm  -    Max Ex. BP 118/64  -    2 Minute Post BP  - 108/72       Oxygen Initial Assessment:   Oxygen  Re-Evaluation:   Oxygen Discharge (Final Oxygen Re-Evaluation):   Initial Exercise Prescription:     Initial Exercise Prescription - 01/23/17 1100      Date of Initial Exercise  RX and Referring Provider   Date 01/23/17   Referring Provider Glori Bickers MD     Treadmill   MPH 2.5   Grade 1   Minutes 10   METs 3.26     Bike   Level 0.8   Minutes 10   METs 2.78     NuStep   Level 3   SPM 80   Minutes 10   METs 2     Prescription Details   Frequency (times per week) 3   Duration Progress to 30 minutes of continuous aerobic without signs/symptoms of physical distress     Intensity   THRR 40-80% of Max Heartrate 68-135   Ratings of Perceived Exertion 11-15   Perceived Dyspnea 0-4     Progression   Progression Continue to progress workloads to maintain intensity without signs/symptoms of physical distress.     Resistance Training   Training Prescription Yes   Weight 3lbs   Reps 10-15      Perform Capillary Blood Glucose checks as needed.  Exercise Prescription Changes:     Exercise Prescription Changes    Row Name 02/02/17 1500             Response to Exercise   Blood Pressure (Admit) 124/84       Blood Pressure (Exercise) 150/84       Blood Pressure (Exit) 108/64       Heart Rate (Admit) 78 bpm       Heart Rate (Exercise) 94 bpm       Heart Rate (Exit) 78 bpm       Symptoms none       Comments Pt was oriented to exercise equipment on 01/29/17        Duration Continue with 30 min of aerobic exercise without signs/symptoms of physical distress.       Intensity THRR unchanged         Progression   Progression Continue to progress workloads to maintain intensity without signs/symptoms of physical distress.       Average METs 2.6         Resistance Training   Training Prescription Yes       Weight 3lbs       Reps 10-15       Time 10 Minutes         Treadmill   MPH 2.5       Grade 1       Minutes 10       METs 3.26         Bike    Level 0.8       Minutes 10       METs 2.78         NuStep   Level 3       SPM 80       Minutes 10       METs 1.9          Exercise Comments:     Exercise Comments    Row Name 01/29/17 1513           Exercise Comments Pt oriented to exercise equipment and responded well to exercise. Pt reported no signs of CP, dizziness and unusual SOB with exercise. Will continue to monitor pt's activity levels.          Exercise Goals and Review:     Exercise Goals    Row Name 01/23/17 [phone]  Exercise Goals   Increase Physical Activity Yes       Intervention Provide advice, education, support and counseling about physical activity/exercise needs.;Develop an individualized exercise prescription for aerobic and resistive training based on initial evaluation findings, risk stratification, comorbidities and participant's personal goals.       Expected Outcomes Achievement of increased cardiorespiratory fitness and enhanced flexibility, muscular endurance and strength shown through measurements of functional capacity and personal statement of participant.       Increase Strength and Stamina Yes       Intervention Provide advice, education, support and counseling about physical activity/exercise needs.;Develop an individualized exercise prescription for aerobic and resistive training based on initial evaluation findings, risk stratification, comorbidities and participant's personal goals.       Expected Outcomes Achievement of increased cardiorespiratory fitness and enhanced flexibility, muscular endurance and strength shown through measurements of functional capacity and personal statement of participant.          Exercise Goals Re-Evaluation :    Discharge Exercise Prescription (Final Exercise Prescription Changes):     Exercise Prescription Changes - 02/02/17 1500      Response to Exercise   Blood Pressure (Admit) 124/84   Blood Pressure (Exercise) 150/84   Blood  Pressure (Exit) 108/64   Heart Rate (Admit) 78 bpm   Heart Rate (Exercise) 94 bpm   Heart Rate (Exit) 78 bpm   Symptoms none   Comments Pt was oriented to exercise equipment on 01/29/17    Duration Continue with 30 min of aerobic exercise without signs/symptoms of physical distress.   Intensity THRR unchanged     Progression   Progression Continue to progress workloads to maintain intensity without signs/symptoms of physical distress.   Average METs 2.6     Resistance Training   Training Prescription Yes   Weight 3lbs   Reps 10-15   Time 10 Minutes     Treadmill   MPH 2.5   Grade 1   Minutes 10   METs 3.26     Bike   Level 0.8   Minutes 10   METs 2.78     NuStep   Level 3   SPM 80   Minutes 10   METs 1.9      Nutrition:  Target Goals: Understanding of nutrition guidelines, daily intake of sodium 1500mg , cholesterol 200mg , calories 30% from fat and 7% or less from saturated fats, daily to have 5 or more servings of fruits and vegetables.  Biometrics:     Pre Biometrics - 01/23/17 0932      Pre Biometrics   Waist Circumference 40 inches   Hip Circumference 42.5 inches   Waist to Hip Ratio 0.94 %   Triceps Skinfold 46 mm   Grip Strength 34 kg   Flexibility 14 in   Single Leg Stand 6.31 seconds         Post Biometrics - 01/23/17 1144       Post  Biometrics   % Body Fat 44.9 %      Nutrition Therapy Plan and Nutrition Goals:     Nutrition Therapy & Goals - 01/23/17 1339      Nutrition Therapy   Diet Carb Modified, Therapeutic Lifestyle Changes     Personal Nutrition Goals   Nutrition Goal Improved glycemic control as evidenced by pt's A1c trending from 7.3 to less than 7   Personal Goal #2 Pt to identify food quantities necessary to achieve weight loss of 6-24 lb (2.7-10.9 kg) at graduation  from cardiac rehab. Goal wt of 165-175 lb desired.      Intervention Plan   Intervention Prescribe, educate and counsel regarding individualized specific  dietary modifications aiming towards targeted core components such as weight, hypertension, lipid management, diabetes, heart failure and other comorbidities.   Expected Outcomes Short Term Goal: Understand basic principles of dietary content, such as calories, fat, sodium, cholesterol and nutrients.;Long Term Goal: Adherence to prescribed nutrition plan.      Nutrition Discharge: Nutrition Scores:   Nutrition Goals Re-Evaluation:   Nutrition Goals Re-Evaluation:   Nutrition Goals Discharge (Final Nutrition Goals Re-Evaluation):   Psychosocial: Target Goals: Acknowledge presence or absence of significant depression and/or stress, maximize coping skills, provide positive support system. Participant is able to verbalize types and ability to use techniques and skills needed for reducing stress and depression.  Initial Review & Psychosocial Screening:     Initial Psych Review & Screening - 01/23/17 1125      Initial Review   Current issues with Current Stress Concerns;Current Anxiety/Panic   Source of Stress Concerns Chronic Illness;Poor Coping Skills;Family;Financial   Comments pt with many stressors due to recent cardiac events and family situation.       Family Dynamics   Good Support System? No   Strains Intra-family strains   Concerns No support system   Comments pt has current conflict in her family unit. pt also experiencing break up with her boyfriend.  pt admits to feeling isolated and alone. pt demonstrates strong faith base but no church or social resources for support.      Barriers   Psychosocial barriers to participate in program The patient should benefit from training in stress management and relaxation.;Psychosocial barriers identified (see note)  pt with multiple barriers including financial, transportation and lack of support system. pt offered emotional support and reassurance..       Screening Interventions   Interventions Encouraged to exercise;To provide  support and resources with identified psychosocial needs;Program counselor consult  pt offered appt with Jeanella Craze and to schedule follow up with her counselor Jonita Albee.        Quality of Life Scores:     Quality of Life - 01/23/17 1131      Quality of Life Scores   Health/Function Pre 10.4 %  pt with multiple concerns about her health and disability.     Socioeconomic Pre 10.5 %  pt without financial resources awaiting disability.  pt having difficulty paying  bills.    Psych/Spiritual Pre 17.14 %  pt demonstrates strong faith base however facing many overwhelming life circumstances. pt reports active prayer life however is not connected to church for support.      Family Pre 6 %  pt has current conflict with her adult children, boyfriend and other relatives. pt feels isolated and withdrawn.    GLOBAL Pre 11.14 %  pt exhibits symptoms of poor coping strategies and being overwhelmed with current life stressors. pt accepting of offer to schedule appt with Jeanella Craze and her therapist Jonita Albee.       PHQ-9: Recent Review Flowsheet Data    Depression screen [facility] 2/9 01/29/2017 09/01/2016 08/23/2016 06/30/2016 04/10/2016   Decreased Interest 3 1 0 1 0   Down, Depressed, Hopeless 3 1 1 1  0   PHQ - 2 Score 6 2 1 2  0   Altered sleeping 3 1  3  3   -   Tired, decreased energy 2 0 3 3 -   Change in appetite  1 0 1 1 -   Feeling bad or failure about yourself  1 0 2 1 -   Trouble concentrating 1 0 1 1 -   Moving slowly or fidgety/restless 1 0 0  1  -   Suicidal thoughts 0 0 0 0 -   PHQ-9 Score 15 3 11 12  -   Difficult doing work/chores Very difficult Somewhat difficult Somewhat difficult Somewhat difficult -     Interpretation of Total Score  Total Score Depression Severity:  1-4 = Minimal depression, 5-9 = Mild depression, 10-14 = Moderate depression, 15-19 = Moderately severe depression, 20-27 = Severe depression   Psychosocial Evaluation and Intervention:     Psychosocial  Evaluation - 01/29/17 1631      Psychosocial Evaluation & Interventions   Interventions Therapist referral;Physician referral;Stress management education;Relaxation education;Encouraged to exercise with the program and follow exercise prescription   Comments pt with current situational stress, health related anxiety and lack of adequate support systems.  pt referred to reestablish care with her therapist.  appt made with Jeanella Craze 02/07/2017 @945 .  PHQ and QOL scores forwarded to pt PCP for evaluation.     Expected Outcomes pt will participate in therapeutic care to develop good coping skills and improved outlook.    Continue Psychosocial Services  Follow up required by staff      Psychosocial Re-Evaluation:     Psychosocial Re-Evaluation    [place] Name 02/05/17 1218             Psychosocial Re-Evaluation   Current issues with Current Stress Concerns;History of Depression;Current Depression       Comments appt made with pt therapist Kaelynn Igo for 02/15/2017.  pt also has upcoming [employer] appt.  pt is eagerly participating in CR program and displays improving outlook.        Expected Outcomes pt will demonstrate positive outlook with good coping skills.       Interventions Therapist referral;Encouraged to attend Cardiac Rehabilitation for the exercise;Stress management education;Relaxation education       Continue Psychosocial Services  Follow up required by staff       Comments pt with many stressors due to recent cardiac events and family situation.           Initial Review   Source of Stress Concerns Chronic Illness;Poor Coping Skills;Family;Financial          Psychosocial Discharge (Final Psychosocial Re-Evaluation):     Psychosocial Re-Evaluation - 02/05/17 1218      Psychosocial Re-Evaluation   Current issues with Current Stress Concerns;History of Depression;Current Depression   Comments appt made with pt therapist Jennalee Greaves for 02/15/2017.  pt also has upcoming  [employer] appt.  pt is eagerly participating in CR program and displays improving outlook.    Expected Outcomes pt will demonstrate positive outlook with good coping skills.   Interventions Therapist referral;Encouraged to attend Cardiac Rehabilitation for the exercise;Stress management education;Relaxation education   Continue Psychosocial Services  Follow up required by staff   Comments pt with many stressors due to recent cardiac events and family situation.       Initial Review   Source of Stress Concerns Chronic Illness;Poor Coping Skills;Family;Financial      Vocational Rehabilitation: Provide vocational rehab assistance to qualifying candidates.   Vocational Rehab Evaluation & Intervention:     Vocational Rehab - 01/23/17 1151      Initial Vocational Rehab Evaluation & Intervention   Assessment shows need for Vocational Rehabilitation No  Education: Education Goals: Education classes will be provided on a weekly basis, covering required topics. Participant will state understanding/return demonstration of topics presented.  Learning Barriers/Preferences:     Learning Barriers/Preferences - 01/23/17 0914      Learning Barriers/Preferences   Learning Barriers Sight   Learning Preferences Written Material;Pictoral;Video      Education Topics: Count Your Pulse:  -Group instruction provided by verbal instruction, demonstration, patient participation and written materials to support subject.  Instructors address importance of being able to find your pulse and how to count your pulse when at home without a heart monitor.  Patients get hands on experience counting their pulse with staff help and individually.   Heart Attack, Angina, and Risk Factor Modification:  -Group instruction provided by verbal instruction, video, and written materials to support subject.  Instructors address signs and symptoms of angina and heart attacks.    Also discuss risk factors for heart  disease and how to make changes to improve heart health risk factors.   Functional Fitness:  -Group instruction provided by verbal instruction, demonstration, patient participation, and written materials to support subject.  Instructors address safety measures for doing things around the house.  Discuss how to get up and down off the floor, how to pick things up properly, how to safely get out of a chair without assistance, and balance training.   Meditation and Mindfulness:  -Group instruction provided by verbal instruction, patient participation, and written materials to support subject.  Instructor addresses importance of mindfulness and meditation practice to help reduce stress and improve awareness.  Instructor also leads participants through a meditation exercise.    Stretching for Flexibility and Mobility:  -Group instruction provided by verbal instruction, patient participation, and written materials to support subject.  Instructors lead participants through series of stretches that are designed to increase flexibility thus improving mobility.  These stretches are additional exercise for major muscle groups that are typically performed during regular warm up and cool down.   Hands Only CPR:  -Group verbal, video, and participation provides a basic overview of AHA guidelines for community CPR. Role-play of emergencies allow participants the opportunity to practice calling for help and chest compression technique with discussion of AED use.   Hypertension: -Group verbal and written instruction that provides a basic overview of hypertension including the most recent diagnostic guidelines, risk factor reduction with self-care instructions and medication management.    Nutrition I class: Heart Healthy Eating:  -Group instruction provided by PowerPoint slides, verbal discussion, and written materials to support subject matter. The instructor gives an explanation and review of the  Therapeutic Lifestyle Changes diet recommendations, which includes a discussion on lipid goals, dietary fat, sodium, fiber, plant stanol/sterol esters, sugar, and the components of a well-balanced, healthy diet.   Nutrition II class: Lifestyle Skills:  -Group instruction provided by PowerPoint slides, verbal discussion, and written materials to support subject matter. The instructor gives an explanation and review of label reading, grocery shopping for heart health, heart healthy recipe modifications, and ways to make healthier choices when eating out.   Diabetes Question & Answer:  -Group instruction provided by PowerPoint slides, verbal discussion, and written materials to support subject matter. The instructor gives an explanation and review of diabetes co-morbidities, pre- and post-prandial blood glucose goals, pre-exercise blood glucose goals, signs, symptoms, and treatment of hypoglycemia and hyperglycemia, and foot care basics.   CARDIAC REHAB PHASE II EXERCISE from 02/02/2017 in [place]  Date  02/02/17  Educator  RD  Instruction Review Code  2- meets goals/outcomes      Diabetes Blitz:  -Group instruction provided by PowerPoint slides, verbal discussion, and written materials to support subject matter. The instructor gives an explanation and review of the physiology behind type 1 and type 2 diabetes, diabetes medications and rational behind using different medications, pre- and post-prandial blood glucose recommendations and Hemoglobin A1c goals, diabetes diet, and exercise including blood glucose guidelines for exercising safely.    Portion Distortion:  -Group instruction provided by PowerPoint slides, verbal discussion, written materials, and food models to support subject matter. The instructor gives an explanation of serving size versus portion size, changes in portions sizes over the last 20 years, and what consists of a serving from each food  group.   Stress Management:  -Group instruction provided by verbal instruction, video, and written materials to support subject matter.  Instructors review role of stress in heart disease and how to cope with stress positively.     Exercising on Your Own:  -Group instruction provided by verbal instruction, power point, and written materials to support subject.  Instructors discuss benefits of exercise, components of exercise, frequency and intensity of exercise, and end points for exercise.  Also discuss use of nitroglycerin and activating EMS.  Review options of places to exercise outside of rehab.  Review guidelines for sex with heart disease.   Cardiac Drugs I:  -Group instruction provided by verbal instruction and written materials to support subject.  Instructor reviews cardiac drug classes: antiplatelets, anticoagulants, beta blockers, and statins.  Instructor discusses reasons, side effects, and lifestyle considerations for each drug class.   Cardiac Drugs II:  -Group instruction provided by verbal instruction and written materials to support subject.  Instructor reviews cardiac drug classes: angiotensin converting enzyme inhibitors (ACE-I), angiotensin II receptor blockers (ARBs), nitrates, and calcium channel blockers.  Instructor discusses reasons, side effects, and lifestyle considerations for each drug class.   Anatomy and Physiology of the Circulatory System:  Group verbal and written instruction and models provide basic cardiac anatomy and physiology, with the coronary electrical and arterial systems. Review of: AMI, Angina, Valve disease, Heart Failure, Peripheral Artery Disease, Cardiac Arrhythmia, Pacemakers, and the ICD.   Other Education:  -Group or individual verbal, written, or video instructions that support the educational goals of the cardiac rehab program.   Knowledge Questionnaire Score:     Knowledge Questionnaire Score - 01/23/17 0901      Knowledge  Questionnaire Score   Pre Score 21/24      Core Components/Risk Factors/Patient Goals at Admission:     Personal Goals and Risk Factors at Admission - 01/23/17 0933      Core Components/Risk Factors/Patient Goals on Admission   Hypertension Yes   Intervention Provide education on lifestyle modifcations including regular physical activity/exercise, weight management, moderate sodium restriction and increased consumption of fresh fruit, vegetables, and low fat dairy, alcohol moderation, and smoking cessation.;Monitor prescription use compliance.   Expected Outcomes Short Term: Continued assessment and intervention until BP is < 140/71mm HG in hypertensive participants. < 130/58mm HG in hypertensive participants with diabetes, heart failure or chronic kidney disease.;Long Term: Maintenance of blood pressure at goal levels.   Stress Yes   Intervention Offer individual and/or small group education and counseling on adjustment to heart disease, stress management and health-related lifestyle change. Teach and support self-help strategies.;Refer participants experiencing significant psychosocial distress to appropriate mental health specialists for further evaluation and treatment. When possible, include family members  and significant others in education/counseling sessions.   Expected Outcomes Short Term: Participant demonstrates changes in health-related behavior, relaxation and other stress management skills, ability to obtain effective social support, and compliance with psychotropic medications if prescribed.;Long Term: Emotional wellbeing is indicated by absence of clinically significant psychosocial distress or social isolation.      Core Components/Risk Factors/Patient Goals Review:      Goals and Risk Factor Review    Row Name 02/05/17 1217             Core Components/Risk Factors/Patient Goals Review   Personal Goals Review Weight Management/Obesity;Heart  Failure;Hypertension;Lipids;Diabetes;Other;Stress       Review pt with multiple CAD RF demonstrates eagerness to participate in CR program.        Expected Outcomes pt will participate in CR exercise, nutrition and lifestyle modification education classes to decrease overall CAD RF.          Core Components/Risk Factors/Patient Goals at Discharge (Final Review):      Goals and Risk Factor Review - 02/05/17 1217      Core Components/Risk Factors/Patient Goals Review   Personal Goals Review Weight Management/Obesity;Heart Failure;Hypertension;Lipids;Diabetes;Other;Stress   Review pt with multiple CAD RF demonstrates eagerness to participate in CR program.    Expected Outcomes pt will participate in CR exercise, nutrition and lifestyle modification education classes to decrease overall CAD RF.      ITP Comments:     ITP Comments    Row Name 01/23/17 0856 02/05/17 1216         ITP Comments Dr. Fransico Him, Medical Director  Dr. Fransico Him, Medical Director          Comments: Pt is making expected progress toward personal goals after completing 4 sessions. Recommend continued exercise and life style modification education including  stress management and relaxation techniques to decrease cardiac risk profile.

## 2017-02-06 NOTE — Telephone Encounter (Signed)
To Dr. Klein to review. 

## 2017-02-06 NOTE — Telephone Encounter (Signed)
Pt called stating she is having a hard time getting in touch with Dr Olin Pia office.  She states she needs a letter from him stating she needs power due to her defib box.  She states the county is going to help her with her power bill but she has to have this letter by tomorrow.  Advised we can not do letter, will need to come from Dr Caryl Comes if he is agreeable since he manages her defib.  Message sent to his office

## 2017-02-07 ENCOUNTER — Encounter: Payer: Self-pay | Admitting: Internal Medicine

## 2017-02-07 ENCOUNTER — Encounter (HOSPITAL_COMMUNITY)
Admission: RE | Admit: 2017-02-07 | Discharge: 2017-02-07 | Disposition: A | Discharge status: Home / Self Care | Payer: Medicaid Other | Source: Ambulatory Visit | Attending: Internal Medicine | Admitting: Internal Medicine

## 2017-02-07 DIAGNOSIS — I5022 Chronic systolic (congestive) heart failure: Secondary | ICD-10-CM

## 2017-02-07 DIAGNOSIS — I110 Hypertensive heart disease with heart failure: Secondary | ICD-10-CM | POA: Diagnosis not present

## 2017-02-07 NOTE — Telephone Encounter (Signed)
I left a message for the patient to call and notify her that Dr. Caryl Comes has done a letter for her for the power company. I asked that she call back to let me know if she wants to pick this up or have this mailed to her.

## 2017-02-07 NOTE — Progress Notes (Signed)
ELECTROPHYSIOLOGY CONSULT NOTE  Patient ID: Susan Peck, MRN: [id number], DOB/AGE: 12/21/1965 51 y.o. Admit date: (Not on file) Date of Consult: 02/07/2017  Primary Physician: Macon Large, MD Primary Cardiologist: CHF DB   Susan Peck is being seen today for the evaluation of icd  at the request of  No ref. provider found.   HPI Susan Peck is a 51 y.o. female  Referred for consideration of an ICD   She has a long-standing history of left ventricular dysfunction and symptoms of congestive heart failure  She is improved somewhat since the introduction of Entresto but is still short of breath at 100 feet and less than a flight of stairs. Nocturnal dyspnea and peripheral edema are much improved proved.  She has chronic 3 pillow orthopnea.  She's trying to adjust to her CPAP.  She denies tachycardia palpitations or syncope.   DATE TEST    9/12 Echo EF 25-30%   2/17    Cath   EF 35 % Normal CA  2/17    Echo   EF 30-35 %   3/*18 Echo EF 20-25%      Past Medical History:  Diagnosis Date  . AICD (automatic cardioverter/defibrillator) present 12/13/2016  . Anxiety    no meds  . Asthma   . CHF (congestive heart failure) ([employer])   . Depression    no meds  . Diabetes mellitus    recent dx 11/17/11 - started med 11/18/11 type 2  . GERD (gastroesophageal reflux disease)   . Goiter 07/27/2011   non-neoplastic goiter - fine needle aspiration - benign sees dr Dwyane Dee for  . Heart murmur    dx 2 yrs ago per pt  . Herpes genitalis in women   . Hypertension   . IBS (irritable bowel syndrome)    tx with diet per pt  . Low back pain    history  . Obesity   . Seasonal allergies   . Shortness of breath    occasional - exercise induced  . Sleep apnea    cpap broken  . Systolic heart failure    May 2011 EF 35-40%, 04/03/11 EF 25-30%, 06/27/11 EF 30-35%      Surgical History:  Past Surgical History:  Procedure Laterality Date  . ABDOMINAL HYSTERECTOMY    . BIV  ICD INSERTION CRT-D N/A 12/13/2016   Procedure: BiV ICD Insertion CRT-D;  Surgeon: Deboraha Sprang, MD;  Location: [facility];  Service: Cardiovascular;  Laterality: N/A;  . cardiac catherization  2004   [place], [place] - Dr Lynnell Jude  . CARDIAC CATHETERIZATION    . CARDIAC CATHETERIZATION N/A 09/15/2015   Procedure: Right/Left Heart Cath and Coronary Angiography;  Surgeon: Jolaine Artist, MD;  Location: [facility];  Service: Cardiovascular;  Laterality: N/A;  . COLONOSCOPY WITH PROPOFOL N/A 03/26/2015   Procedure: COLONOSCOPY WITH PROPOFOL;  Surgeon: Carol Ada, MD;  Location: [facility];  Service: Endoscopy;  Laterality: N/A;  . colonscopy    . CYSTOSCOPY  11/23/2011   Procedure: CYSTOSCOPY;  Surgeon: Jolayne Haines, MD;  Location: [facility];  Service: Gynecology;  Laterality: N/A;  . DIAGNOSTIC LAPAROSCOPY     of pelvis  . DILATION AND CURETTAGE OF UTERUS  12/2006,  10/2005   hysteroscopy surgery x 2  . INSERTION OF ICD  12/13/2016   BIV  . LAPAROSCOPIC ASSISTED VAGINAL HYSTERECTOMY  11/23/2011   Procedure: LAPAROSCOPIC ASSISTED VAGINAL HYSTERECTOMY;  Surgeon: Pierre Bali  Laurance Flatten, MD;  Location: [facility];  Service: Gynecology;  Laterality: N/A;  . NOVASURE ABLATION     10/2005  . SALPINGOOPHORECTOMY  11/23/2011   Procedure: SALPINGO OOPHERECTOMY;  Surgeon: Jolayne Haines, MD;  Location: [facility];  Service: Gynecology;  Laterality: Bilateral;  . SVD     x 3  . TUBAL LIGATION    . WISDOM TOOTH EXTRACTION       Home Meds: Prior to Admission medications   Medication Sig Start Date End Date Taking? Authorizing Provider  albuterol (PROVENTIL HFA;VENTOLIN HFA) 108 (90 Base) MCG/ACT inhaler Inhale 1-2 puffs into the lungs every 6 (six) hours as needed for wheezing or shortness of breath. 08/23/16   Burgess Estelle, MD  aspirin 81 MG chewable tablet Chew 81 mg by mouth daily.     Historical Provider, MD  Aspirin-Acetaminophen-Caffeine (GOODY HEADACHE PO) Take 1 packet by mouth daily as needed  (pain/headache).    Historical Provider, MD  atorvastatin (LIPITOR) 40 MG tablet Take 1 tablet (40 mg total) by mouth daily. 06/01/15   Jolaine Artist, MD  BAYER CONTOUR TEST test strip  11/03/15   Historical Provider, MD  benzonatate (TESSALON PERLES) 100 MG capsule Take 1 capsule (100 mg total) by mouth every 6 (six) hours as needed for cough. Patient not taking: Reported on 10/03/2016 08/23/16 08/23/17  Burgess Estelle, MD  carvedilol (COREG) 25 MG tablet Take 1 tablet (25 mg total) by mouth 2 (two) times daily with a meal. 11/07/16   Jolaine Artist, MD  chlorpheniramine-HYDROcodone [facility] PENNKINETIC ER) 10-8 MG/5ML SUER Take 5 mLs by mouth every 12 (twelve) hours as needed for cough. Patient not taking: Reported on 08/18/2016 06/26/16   Robyn Haber, MD  dicyclomine (BENTYL) 20 MG tablet Take 1 tablet (20 mg total) by mouth 2 (two) times daily. Patient not taking: Reported on 11/07/2016 10/03/16   Margette Fast, MD  DULoxetine (CYMBALTA) 30 MG capsule Take 1 capsule (30 mg total) by mouth daily. Patient not taking: Reported on 11/07/2016 04/26/16   Elayne Snare, MD  estradiol (ESTRACE) 1 MG tablet Take 1 tablet (1 mg total) by mouth daily. 08/12/15   Terrance Mass, MD  fluticasone (FLONASE) 50 MCG/ACT nasal spray Place 1 spray into both nostrils 2 (two) times daily as needed for allergies or rhinitis. 09/27/16   Bethany Molt, DO  furosemide (LASIX) 40 MG tablet Take 1 tablet (40 mg total) by mouth 2 (two) times daily. 08/07/16   Amy D Clegg, NP  glimepiride (AMARYL) 2 MG tablet Take 2 mg by mouth daily with breakfast.    Historical Provider, MD  guaiFENesin-codeine 100-10 MG/5ML syrup Take 5 mLs by mouth every 6 (six) hours as needed for cough. For no more than 1 week Patient not taking: Reported on 10/16/2016 08/23/16   Burgess Estelle, MD  levothyroxine (SYNTHROID, LEVOTHROID) 50 MCG tablet Take 1 tablet (50 mcg total) by mouth daily. 03/17/16   Elayne Snare, MD  loratadine (CLARITIN) 10 MG tablet  Take 1 tablet (10 mg total) by mouth daily. 08/23/16 08/23/17  Burgess Estelle, MD  omeprazole (PRILOSEC) 40 MG capsule Take 1 capsule (40 mg total) by mouth 2 (two) times daily. 11/05/16 11/05/17  Bethany Molt, DO  OVER THE COUNTER MEDICATION Place 1 spray into both nostrils daily as needed (congestion). Generic Vicks twisthaler    Historical Provider, MD  Polyethyl Glycol-Propyl Glycol (SYSTANE) 0.4-0.3 % SOLN Place 1 drop into both eyes 2 (two) times daily as needed (dry eyes).  Historical Provider, MD  PRESCRIPTION MEDICATION Inhale into the lungs at bedtime. CPAP    Historical Provider, MD  ranitidine (ZANTAC) 150 MG tablet Take 1 tablet (150 mg total) by mouth 2 (two) times daily. 11/05/16 11/05/17  Bethany Molt, DO  sacubitril-valsartan (ENTRESTO) 97-103 MG Take 1 tablet by mouth 2 (two) times daily. 08/18/16   Shirley Friar, PA-C  sodium chloride (OCEAN) 0.65 % SOLN nasal spray Place 2 sprays into both nostrils daily as needed for congestion.    Historical Provider, MD  sucralfate (CARAFATE) 1 GM/10ML suspension Take 10 mLs (1 g total) by mouth 4 (four) times daily -  with meals and at bedtime. Patient not taking: Reported on 11/07/2016 10/03/16   Margette Fast, MD    Allergies:  Allergies  Allergen Reactions  . Shrimp [Shellfish Allergy] Anaphylaxis    Social History   Social History  . Marital status: Divorced    Spouse name: N/A  . Number of children: Y  . Years of education: N/A   Occupational History  . 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 Smoking status: Never Smoker  . Smokeless tobacco: Never Used  . Alcohol use No  . Drug use: No  . Sexual activity: No   Other Topics Concern  . Not on file   Social History Narrative   She lives with her husband and son.  She is works for [employer].     Family History  Problem Relation Age of Onset  . Heart disease Maternal Aunt   . Breast cancer Mother   . Thyroid disease Mother    . Hypertension Maternal Grandmother   . Diabetes Maternal Grandmother   . Breast cancer Maternal Aunt   . Heart disease Maternal Aunt   . Diabetes Maternal Grandfather   . Diabetes Paternal Grandmother   . Diabetes Paternal Grandfather   . Hypertension Paternal Grandfather      ROS:  Please see the history of present illness.     All other systems reviewed and negative.    Physical Exam:  There were no vitals taken for this visit. General: Well developed, obese female in no acute distress. Head: Normocephalic, atraumatic, sclera non-icteric, no xanthomas, nares are without discharge. EENT: normal  Lymph Nodes:  none Neck: Negative for carotid bruits. JVD not elevated. Back:without scoliosis kyphosis  Lungs: Clear bilaterally to auscultation without wheezes, rales, or rhonchi. Breathing is unlabored. Heart: RRR with S1 S2.  2/6 systolic  murmur . No rubs, or gallops appreciated. Abdomen: Soft, non-tender, non-distended with normoactive bowel sounds. No hepatomegaly. No rebound/guarding. No obvious abdominal masses. Msk:  Strength and tone appear normal for age. Extremities: No clubbing or cyanosis. No  edema.  Distal pedal pulses are 2+ and equal bilaterally. Skin: Warm and Dry Neuro: Alert and oriented X 3. CN III-XII intact Grossly normal sensory and motor function . Psych:  Responds to questions appropriately with a normal affect.      Labs: Cardiac Enzymes No results for input(s): CKTOTAL, CKMB, TROPONINI in the last 72 hours. CBC Lab Results  Component Value Date   WBC 7.1 01/16/2017   HGB 13.8 01/16/2017   HCT 39.7 01/16/2017   MCV 78.3 01/16/2017   PLT 210 01/16/2017   PROTIME: No results for input(s): LABPROT, INR in the last 72 hours. Chemistry No results for input(s): NA, K, CL, CO2, BUN, CREATININE, CALCIUM, PROT, BILITOT, ALKPHOS, ALT, AST, GLUCOSE in the last 168  hours.  Invalid input(s): LABALBU Lipids Lab Results  Component Value Date   CHOL 192  11/09/2015   HDL 39.50 11/09/2015   LDLCALC 115 (H) 11/09/2015   TRIG 189.0 (H) 11/09/2015   BNP Pro B Natriuretic peptide (BNP)  Date/Time Value Ref Range Status  08/19/2014 10:58 AM 316.0 (H) 0.0 - 100.0 pg/mL Final  06/21/2014 10:22 AM 744.9 (H) 0 - 125 pg/mL Final  04/03/2011 04:21 AM 182.8 (H) 0 - 125 pg/mL Final  04/01/2011 05:30 PM 564.3 (H) 0 - 125 pg/mL Final   Thyroid Function Tests: No results for input(s): TSH, T4TOTAL, T3FREE, THYROIDAB in the last 72 hours.  Invalid input(s): FREET3 Miscellaneous Lab Results  Component Value Date   DDIMER 0.48 04/01/2011    Radiology/Studies:  No results found.  EKG: Sinus is 72 Intervals 16/16/44 Left axis -49   Assessment and Plan:  Nonischemic cardiomyopathy  Congestive heart failure-chronic-systolic-class III  Probable obstructive sleep apnea  Left bundle branch block   The patient has persistent nonischemic cardiomyopathy with congestive heart failure left branch block. She is young. It is reasonable to consider ICD implantation for primary prevention in the context of the [place] Trial.  With her congestive heart failure and brought left bundle branch block, particularly given gender , it is appropriate to pursue CRT at the same time as ICD implantation.  She is on guideline directed medical therapy. She remains quite anxious. We have discussed alternatives and the potential benefits of device implantation to reduce the risk of sudden death and to mitigate heart failure symptoms.  Have reviewed the potential benefits and risks of ICD implantation including but not limited to death, perforation of heart or lung, lead dislodgement, infection,  device malfunction and inappropriate shocks.  The patient and family express understanding  and are willing to proceed.          Virl Axe

## 2017-02-07 NOTE — Telephone Encounter (Signed)
Note written

## 2017-02-07 NOTE — Telephone Encounter (Signed)
Patient aware letter is done- she did come by the office to pick this up.

## 2017-02-07 NOTE — Progress Notes (Signed)
Susan Peck 51 y.o. female      Nutrition Note Nutrition Diagnosis ? Food-and nutrition-related knowledge deficit related to lack of exposure to information as related to diagnosis of: ? CVD ? DM ? Obesity related to excessive energy intake as evidenced by a BMI of 32.8  Nutrition Goal(s):  ? Improved glycemic control as evidenced by pt's A1c trending from 7.3 to less than 7 ? Pt to identify food quantities necessary to achieve weight loss of 6-24 lb (2.7-10.9 kg) at graduation from cardiac rehab. Goal wt of 165-175 lb desired.   Nutrition Note Spoke with pt. Nutrition Plan and Nutrition Survey goals reviewed with pt. Pt is working towards following the Therapeutic Lifestyle Changes diet. Pt wants to lose wt. Pt has been trying to lose wt by decreasing portions sizes, but pt c/o increased appetite since starting rehab. Pt states she gets bored when she's at home and eats more often. Alternatives to eating discussed (e.g. Going to [employer], visiting a nursing home). Pt is diabetic. Last A1c indicates blood glucose not optimally controlled. Pt checks CBG's once daily at alternating times of the day. Pt has her f/u DM appointment with Dr. Dwyane Dee 02/22/17. Pt expressed understanding of the information reviewed. Pt aware of nutrition education classes offered and plans on attending nutrition classes.  Nutrition Intervention ? Pt's individual nutrition plan reviewed with pt. ? Benefits of adopting Therapeutic Lifestyle Changes discussed when Medficts reviewed.   ? Pt given handouts for: ? 1500 kcal, 5 day menu ideas ? Nutrition Therapy for Type 2 Diabetics  Plan:  Pt to attend nutrition classes ? Nutrition I ? Nutrition II ? Portion Distortion  ? Diabetes Blitz ? Diabetes Q & A Will provide client-centered nutrition education as part of interdisciplinary care.   Monitor and evaluate progress toward nutrition goal with team.  Derek Mound, M.Ed, RD, LDN, CDE 02/07/2017 9:55 AM

## 2017-02-09 ENCOUNTER — Encounter (HOSPITAL_COMMUNITY)
Admission: RE | Admit: 2017-02-09 | Discharge: 2017-02-09 | Disposition: A | Discharge status: Home / Self Care | Payer: Medicaid Other | Source: Ambulatory Visit | Attending: Internal Medicine | Admitting: Internal Medicine

## 2017-02-09 DIAGNOSIS — I110 Hypertensive heart disease with heart failure: Secondary | ICD-10-CM | POA: Diagnosis not present

## 2017-02-09 DIAGNOSIS — I5022 Chronic systolic (congestive) heart failure: Secondary | ICD-10-CM

## 2017-02-09 LAB — GLUCOSE, CAPILLARY: GLUCOSE-CAPILLARY: 207 mg/dL — AB (ref 65–99)

## 2017-02-12 ENCOUNTER — Encounter (HOSPITAL_COMMUNITY)
Admission: RE | Admit: 2017-02-12 | Discharge: 2017-02-12 | Disposition: A | Discharge status: Home / Self Care | Payer: Medicaid Other | Source: Ambulatory Visit | Attending: Internal Medicine | Admitting: Internal Medicine

## 2017-02-12 DIAGNOSIS — I110 Hypertensive heart disease with heart failure: Secondary | ICD-10-CM | POA: Diagnosis not present

## 2017-02-12 DIAGNOSIS — I5022 Chronic systolic (congestive) heart failure: Secondary | ICD-10-CM

## 2017-02-12 LAB — GLUCOSE, CAPILLARY: GLUCOSE-CAPILLARY: 251 mg/dL — AB (ref 65–99)

## 2017-02-12 NOTE — Progress Notes (Signed)
Reviewed home exercise with pt today.  Pt plans to walk at [facility] for exercise, 2x/week in addition to coming to cardiac rehab.  Reviewed THR, pulse, RPE, sign and symptoms, and when to call 911 or MD.  Also discussed weather considerations and indoor options.  Pt voiced understanding.    Susan Peck [employer]

## 2017-02-14 ENCOUNTER — Encounter (HOSPITAL_COMMUNITY): Payer: Medicaid Other

## 2017-02-14 ENCOUNTER — Encounter (HOSPITAL_COMMUNITY)
Admission: RE | Admit: 2017-02-14 | Discharge: 2017-02-14 | Disposition: A | Discharge status: Home / Self Care | Payer: Medicaid Other | Source: Ambulatory Visit | Attending: Internal Medicine | Admitting: Internal Medicine

## 2017-02-14 DIAGNOSIS — I5022 Chronic systolic (congestive) heart failure: Secondary | ICD-10-CM

## 2017-02-14 DIAGNOSIS — I110 Hypertensive heart disease with heart failure: Secondary | ICD-10-CM | POA: Diagnosis not present

## 2017-02-16 ENCOUNTER — Encounter (HOSPITAL_COMMUNITY)
Admission: RE | Admit: 2017-02-16 | Discharge: 2017-02-16 | Disposition: A | Discharge status: Home / Self Care | Payer: Medicaid Other | Source: Ambulatory Visit | Attending: Internal Medicine | Admitting: Internal Medicine

## 2017-02-16 ENCOUNTER — Encounter (HOSPITAL_COMMUNITY): Payer: Medicaid Other

## 2017-02-16 DIAGNOSIS — I110 Hypertensive heart disease with heart failure: Secondary | ICD-10-CM | POA: Diagnosis not present

## 2017-02-16 DIAGNOSIS — I5022 Chronic systolic (congestive) heart failure: Secondary | ICD-10-CM

## 2017-02-19 ENCOUNTER — Encounter (HOSPITAL_COMMUNITY): Payer: Medicaid Other

## 2017-02-20 ENCOUNTER — Other Ambulatory Visit: Payer: Self-pay

## 2017-02-20 ENCOUNTER — Encounter: Payer: Self-pay | Admitting: Endocrinology

## 2017-02-21 ENCOUNTER — Encounter (HOSPITAL_COMMUNITY)
Admission: RE | Admit: 2017-02-21 | Discharge: 2017-02-21 | Disposition: A | Discharge status: Home / Self Care | Payer: Medicare Other | Source: Ambulatory Visit | Attending: Internal Medicine | Admitting: Internal Medicine

## 2017-02-21 ENCOUNTER — Encounter (HOSPITAL_COMMUNITY): Payer: Medicare Other

## 2017-02-21 DIAGNOSIS — F419 Anxiety disorder, unspecified: Secondary | ICD-10-CM | POA: Diagnosis not present

## 2017-02-21 DIAGNOSIS — J45909 Unspecified asthma, uncomplicated: Secondary | ICD-10-CM | POA: Diagnosis not present

## 2017-02-21 DIAGNOSIS — K219 Gastro-esophageal reflux disease without esophagitis: Secondary | ICD-10-CM | POA: Diagnosis not present

## 2017-02-21 DIAGNOSIS — I110 Hypertensive heart disease with heart failure: Secondary | ICD-10-CM | POA: Insufficient documentation

## 2017-02-21 DIAGNOSIS — K589 Irritable bowel syndrome without diarrhea: Secondary | ICD-10-CM | POA: Diagnosis not present

## 2017-02-21 DIAGNOSIS — G4730 Sleep apnea, unspecified: Secondary | ICD-10-CM | POA: Diagnosis not present

## 2017-02-21 DIAGNOSIS — F3289 Other specified depressive episodes: Secondary | ICD-10-CM | POA: Insufficient documentation

## 2017-02-21 DIAGNOSIS — I5022 Chronic systolic (congestive) heart failure: Secondary | ICD-10-CM | POA: Insufficient documentation

## 2017-02-21 DIAGNOSIS — J302 Other seasonal allergic rhinitis: Secondary | ICD-10-CM | POA: Insufficient documentation

## 2017-02-21 DIAGNOSIS — Z95810 Presence of automatic (implantable) cardiac defibrillator: Secondary | ICD-10-CM | POA: Insufficient documentation

## 2017-02-21 DIAGNOSIS — E669 Obesity, unspecified: Secondary | ICD-10-CM | POA: Insufficient documentation

## 2017-02-21 DIAGNOSIS — I509 Heart failure, unspecified: Secondary | ICD-10-CM | POA: Diagnosis present

## 2017-02-22 ENCOUNTER — Ambulatory Visit: Payer: Medicaid Other | Admitting: Endocrinology

## 2017-02-23 ENCOUNTER — Encounter (HOSPITAL_COMMUNITY): Payer: Medicare Other

## 2017-02-23 ENCOUNTER — Encounter (HOSPITAL_COMMUNITY)
Admission: RE | Admit: 2017-02-23 | Discharge: 2017-02-23 | Disposition: A | Discharge status: Home / Self Care | Payer: Medicare Other | Source: Ambulatory Visit | Attending: Internal Medicine | Admitting: Internal Medicine

## 2017-02-23 DIAGNOSIS — Z95810 Presence of automatic (implantable) cardiac defibrillator: Secondary | ICD-10-CM | POA: Diagnosis not present

## 2017-02-23 DIAGNOSIS — I5022 Chronic systolic (congestive) heart failure: Secondary | ICD-10-CM | POA: Diagnosis not present

## 2017-02-23 DIAGNOSIS — I110 Hypertensive heart disease with heart failure: Secondary | ICD-10-CM | POA: Diagnosis not present

## 2017-02-23 DIAGNOSIS — J45909 Unspecified asthma, uncomplicated: Secondary | ICD-10-CM | POA: Diagnosis not present

## 2017-02-23 DIAGNOSIS — F419 Anxiety disorder, unspecified: Secondary | ICD-10-CM | POA: Diagnosis not present

## 2017-02-23 DIAGNOSIS — K219 Gastro-esophageal reflux disease without esophagitis: Secondary | ICD-10-CM | POA: Diagnosis not present

## 2017-02-26 ENCOUNTER — Encounter (HOSPITAL_COMMUNITY)
Admission: RE | Admit: 2017-02-26 | Discharge: 2017-02-26 | Disposition: A | Discharge status: Home / Self Care | Payer: Medicare Other | Source: Ambulatory Visit | Attending: Internal Medicine | Admitting: Internal Medicine

## 2017-02-26 ENCOUNTER — Encounter (HOSPITAL_COMMUNITY): Payer: Medicare Other

## 2017-02-26 DIAGNOSIS — J45909 Unspecified asthma, uncomplicated: Secondary | ICD-10-CM | POA: Diagnosis not present

## 2017-02-26 DIAGNOSIS — K219 Gastro-esophageal reflux disease without esophagitis: Secondary | ICD-10-CM | POA: Diagnosis not present

## 2017-02-26 DIAGNOSIS — I110 Hypertensive heart disease with heart failure: Secondary | ICD-10-CM | POA: Diagnosis not present

## 2017-02-26 DIAGNOSIS — Z95810 Presence of automatic (implantable) cardiac defibrillator: Secondary | ICD-10-CM | POA: Diagnosis not present

## 2017-02-26 DIAGNOSIS — I5022 Chronic systolic (congestive) heart failure: Secondary | ICD-10-CM | POA: Diagnosis not present

## 2017-02-26 DIAGNOSIS — F419 Anxiety disorder, unspecified: Secondary | ICD-10-CM | POA: Diagnosis not present

## 2017-02-26 LAB — GLUCOSE, CAPILLARY: Glucose-Capillary: 195 mg/dL — ABNORMAL HIGH (ref 65–99)

## 2017-02-28 ENCOUNTER — Encounter (HOSPITAL_COMMUNITY): Payer: Medicare Other

## 2017-02-28 ENCOUNTER — Encounter (HOSPITAL_COMMUNITY)
Admission: RE | Admit: 2017-02-28 | Discharge: 2017-02-28 | Disposition: A | Discharge status: Home / Self Care | Payer: Medicare Other | Source: Ambulatory Visit | Attending: Internal Medicine | Admitting: Internal Medicine

## 2017-02-28 DIAGNOSIS — I5022 Chronic systolic (congestive) heart failure: Secondary | ICD-10-CM | POA: Diagnosis not present

## 2017-02-28 DIAGNOSIS — Z95810 Presence of automatic (implantable) cardiac defibrillator: Secondary | ICD-10-CM | POA: Diagnosis not present

## 2017-02-28 DIAGNOSIS — I110 Hypertensive heart disease with heart failure: Secondary | ICD-10-CM | POA: Diagnosis not present

## 2017-02-28 DIAGNOSIS — F419 Anxiety disorder, unspecified: Secondary | ICD-10-CM | POA: Diagnosis not present

## 2017-02-28 DIAGNOSIS — J45909 Unspecified asthma, uncomplicated: Secondary | ICD-10-CM | POA: Diagnosis not present

## 2017-02-28 DIAGNOSIS — K219 Gastro-esophageal reflux disease without esophagitis: Secondary | ICD-10-CM | POA: Diagnosis not present

## 2017-03-01 ENCOUNTER — Encounter (HOSPITAL_COMMUNITY): Payer: Self-pay | Admitting: Emergency Medicine

## 2017-03-01 ENCOUNTER — Ambulatory Visit: Payer: Medicaid Other | Admitting: Endocrinology

## 2017-03-01 DIAGNOSIS — S91301S Unspecified open wound, right foot, sequela: Secondary | ICD-10-CM | POA: Diagnosis not present

## 2017-03-01 DIAGNOSIS — I5022 Chronic systolic (congestive) heart failure: Secondary | ICD-10-CM | POA: Diagnosis not present

## 2017-03-01 DIAGNOSIS — D492 Neoplasm of unspecified behavior of bone, soft tissue, and skin: Secondary | ICD-10-CM | POA: Diagnosis not present

## 2017-03-01 DIAGNOSIS — I110 Hypertensive heart disease with heart failure: Secondary | ICD-10-CM | POA: Insufficient documentation

## 2017-03-01 DIAGNOSIS — Z79899 Other long term (current) drug therapy: Secondary | ICD-10-CM | POA: Insufficient documentation

## 2017-03-01 DIAGNOSIS — E119 Type 2 diabetes mellitus without complications: Secondary | ICD-10-CM | POA: Diagnosis not present

## 2017-03-01 DIAGNOSIS — M7989 Other specified soft tissue disorders: Secondary | ICD-10-CM | POA: Diagnosis not present

## 2017-03-01 DIAGNOSIS — Z7982 Long term (current) use of aspirin: Secondary | ICD-10-CM | POA: Insufficient documentation

## 2017-03-01 DIAGNOSIS — L7602 Intraoperative hemorrhage and hematoma of skin and subcutaneous tissue complicating other procedure: Secondary | ICD-10-CM | POA: Diagnosis not present

## 2017-03-01 DIAGNOSIS — Y33XXXD Other specified events, undetermined intent, subsequent encounter: Secondary | ICD-10-CM | POA: Insufficient documentation

## 2017-03-01 NOTE — ED Triage Notes (Signed)
Pt reports having biopsy done to L foot this morning, site has been bleeding ever since, pt called doctor who did biopsy and she tried at home methods to stop the bleeding which were unsuccessful. Bleeding noted through bandage on foot right now but bleeding is controlled.

## 2017-03-02 ENCOUNTER — Encounter (HOSPITAL_COMMUNITY): Payer: Medicare Other

## 2017-03-02 ENCOUNTER — Telehealth (HOSPITAL_COMMUNITY): Payer: Self-pay | Admitting: Cardiovascular Disease

## 2017-03-02 ENCOUNTER — Emergency Department (HOSPITAL_COMMUNITY)
Admission: EM | Admit: 2017-03-02 | Discharge: 2017-03-02 | Disposition: A | Discharge status: Home / Self Care | Payer: Medicaid Other | Attending: Emergency Medicine | Admitting: Emergency Medicine

## 2017-03-02 DIAGNOSIS — T148XXA Other injury of unspecified body region, initial encounter: Secondary | ICD-10-CM

## 2017-03-02 MED ORDER — LIDOCAINE-EPINEPHRINE (PF) 2 %-1:200000 IJ SOLN
10.0000 mL | Freq: Once | INTRAMUSCULAR | Status: AC
  Administered 2017-03-02: 10 mL
  Filled 2017-03-02: qty 20

## 2017-03-02 NOTE — Discharge Instructions (Signed)
As discussed, follow up with the provider who performed the biopsy.  Apply direct firm pressure to the site if bleeding returns. Return if experiencing uncontrolled bleeding.

## 2017-03-02 NOTE — ED Provider Notes (Signed)
[facility] DEPT Provider Note   CSN: [id number] Arrival date & time: 03/01/17  2106     History   Chief Complaint No chief complaint on file.   HPI Susan Peck is a 51 y.o. female presenting with persistent bleeding after biopsy to the dorsum of her right foot this morning in the office. She has contacted the office and given instruction on how to control bleeding without success. She has attempted to apply pressure use ice the bleeding persists. She denies any other symptoms.  HPI  Past Medical History:  Diagnosis Date  . AICD (automatic cardioverter/defibrillator) present 12/13/2016  . Anxiety    no meds  . Asthma   . CHF (congestive heart failure) ([place])   . Depression    no meds  . Diabetes mellitus    recent dx 11/17/11 - started med 11/18/11 type 2  . GERD (gastroesophageal reflux disease)   . Goiter 07/27/2011   non-neoplastic goiter - fine needle aspiration - benign sees dr Dwyane Dee for  . Heart murmur    dx 2 yrs ago per pt  . Herpes genitalis in women   . Hypertension   . IBS (irritable bowel syndrome)    tx with diet per pt  . Low back pain    history  . Obesity   . Seasonal allergies   . Shortness of breath    occasional - exercise induced  . Sleep apnea    cpap broken  . Systolic heart failure    May 2011 EF 35-40%, 04/03/11 EF 25-30%, 06/27/11 EF 30-35%    Patient Active Problem List   Diagnosis Date Noted  . NICM (nonischemic cardiomyopathy) ([place]) 12/13/2016  . LBBB (left bundle branch block) 12/13/2016  . Chronic cough 08/23/2016  . HLD (hyperlipidemia) 08/18/2016  . Acute sinusitis with symptoms > 10 days 06/30/2016  . History of colonic polyps 03/08/2016  . Intercostal muscle pain 12/28/2015  . Chronic hip pain 12/28/2015  . Diabetes mellitus type II, uncontrolled ([place]) 10/05/2015  . HTN (hypertension) 09/17/2014  . Perimenopause 04/15/2013  . Goiter 07/10/2011  . Obstructive sleep apnea 05/29/2011  . Chronic systolic heart failure  ([place]) 04/10/2011    Past Surgical History:  Procedure Laterality Date  . ABDOMINAL HYSTERECTOMY    . BIV ICD INSERTION CRT-D N/A 12/13/2016   Procedure: BiV ICD Insertion CRT-D;  Surgeon: Deboraha Sprang, MD;  Location: [facility];  Service: Cardiovascular;  Laterality: N/A;  . cardiac catherization  2004   [place], [place] - Dr Lynnell Jude  . CARDIAC CATHETERIZATION    . CARDIAC CATHETERIZATION N/A 09/15/2015   Procedure: Right/Left Heart Cath and Coronary Angiography;  Surgeon: Jolaine Artist, MD;  Location: [facility];  Service: Cardiovascular;  Laterality: N/A;  . COLONOSCOPY WITH PROPOFOL N/A 03/26/2015   Procedure: COLONOSCOPY WITH PROPOFOL;  Surgeon: Carol Ada, MD;  Location: [facility];  Service: Endoscopy;  Laterality: N/A;  . colonscopy    . CYSTOSCOPY  11/23/2011   Procedure: CYSTOSCOPY;  Surgeon: Jolayne Haines, MD;  Location: [facility];  Service: Gynecology;  Laterality: N/A;  . DIAGNOSTIC LAPAROSCOPY     of pelvis  . DILATION AND CURETTAGE OF UTERUS  12/2006,  10/2005   hysteroscopy surgery x 2  . INSERTION OF ICD  12/13/2016   BIV  . LAPAROSCOPIC ASSISTED VAGINAL HYSTERECTOMY  11/23/2011   Procedure: LAPAROSCOPIC ASSISTED VAGINAL HYSTERECTOMY;  Surgeon: Jolayne Haines, MD;  Location: [facility];  Service: Gynecology;  Laterality: N/A;  .  NOVASURE ABLATION     10/2005  . SALPINGOOPHORECTOMY  11/23/2011   Procedure: SALPINGO OOPHERECTOMY;  Surgeon: Jolayne Haines, MD;  Location: [facility];  Service: Gynecology;  Laterality: Bilateral;  . SVD     x 3  . TUBAL LIGATION    . WISDOM TOOTH EXTRACTION      OB History    Gravida Para Term Preterm AB Living   7 3     3 3    SAB TAB Ectopic Multiple Live Births   1               Home Medications    Prior to Admission medications   Medication Sig Start Date End Date Taking? Authorizing Provider  albuterol (PROVENTIL HFA;VENTOLIN HFA) 108 (90 Base) MCG/ACT inhaler Inhale 1-2 puffs into the lungs every 6 (six) hours as  needed for wheezing or shortness of breath. 08/23/16   Burgess Estelle, MD  aspirin 81 MG chewable tablet Chew 81 mg by mouth daily.     [provider]  Aspirin-Acetaminophen-Caffeine (GOODY HEADACHE PO) Take 1 packet by mouth daily as needed (pain/headache).    [provider]  atorvastatin (LIPITOR) 40 MG tablet Take 40 mg by mouth daily.    [provider]  BAYER CONTOUR TEST test strip  11/03/15   [provider]  carvedilol (COREG) 25 MG tablet Take 1 tablet (25 mg total) by mouth 2 (two) times daily with a meal. 11/07/16   Bensimhon, Shaune Pascal, MD  estradiol (ESTRACE) 1 MG tablet Take 1 tablet (1 mg total) by mouth daily. 08/12/15   Terrance Mass, MD  fluticasone [facility]) 50 MCG/ACT nasal spray Place 1 spray into both nostrils 2 (two) times daily as needed for allergies or rhinitis. 11/08/16   Molt, Bethany, DO  furosemide (LASIX) 40 MG tablet TAKE 1 TABLET (40 MG TOTAL) BY MOUTH 2 (TWO) TIMES DAILY. 11/08/16   Bensimhon, Shaune Pascal, MD  glimepiride (AMARYL) 2 MG tablet Take 2 mg by mouth daily.     [provider]  levothyroxine (SYNTHROID, LEVOTHROID) 50 MCG tablet Take 1 tablet (50 mcg total) by mouth daily. 03/17/16   Elayne Snare, MD  loratadine (CLARITIN) 10 MG tablet Take 1 tablet (10 mg total) by mouth daily. 08/23/16 08/23/17  Burgess Estelle, MD  PRESCRIPTION MEDICATION Inhale into the lungs at bedtime. CPAP    [provider]  ranitidine (ZANTAC) 150 MG tablet Take 1 tablet (150 mg total) by mouth 2 (two) times daily. Patient taking differently: Take 150 mg by mouth daily as needed for heartburn.  11/05/16 11/05/17  Molt, Bethany, DO  sacubitril-valsartan (ENTRESTO) 97-103 MG Take 1 tablet by mouth 2 (two) times daily. 01/29/17   Bensimhon, Shaune Pascal, MD  sodium chloride (OCEAN) 0.65 % SOLN nasal spray Place 2 sprays into both nostrils daily as needed for congestion.    [provider]    Family History Family History  Problem  Relation Age of Onset  . Heart disease Maternal Aunt   . Breast cancer Mother   . Thyroid disease Mother   . Hypertension Maternal Grandmother   . Diabetes Maternal Grandmother   . Breast cancer Maternal Aunt   . Heart disease Maternal Aunt   . Diabetes Maternal Grandfather   . Diabetes Paternal Grandmother   . Diabetes Paternal Grandfather   . Hypertension Paternal Grandfather     Social History Social History  Substance Use Topics  . Smoking status: Never Smoker  . Smokeless tobacco:  Never Used  . Alcohol use No     Allergies   Shrimp [shellfish allergy]   Review of Systems Review of Systems  Respiratory: Negative for shortness of breath.   Cardiovascular: Negative for chest pain.  Skin: Positive for wound. Negative for pallor.  Neurological: Negative for dizziness, weakness and light-headedness.     Physical Exam Updated Vital Signs BP (!) 141/95   Pulse 73   Temp 97.8 F (36.6 C) (Oral)   Resp 16   Ht 5\' 4"  (1.626 m)   Wt 84.8 kg (187 lb)   SpO2 96%   BMI 32.10 kg/m   Physical Exam  Constitutional: She appears well-developed and well-nourished. No distress.  Afebrile, nontoxic-appearing, lying comfortably in bed in no acute distress.  HENT:  Head: Normocephalic and atraumatic.  Eyes: Conjunctivae and EOM are normal.  Cardiovascular: Normal rate, regular rhythm and normal heart sounds.   No murmur heard. Pulmonary/Chest: Effort normal and breath sounds normal. No respiratory distress. She has no wheezes.  Musculoskeletal: Normal range of motion. She exhibits tenderness. She exhibits no edema or deformity.  Neurological: She is alert.  Skin: Skin is warm and dry. Capillary refill takes less than 2 seconds. No rash noted. She is not diaphoretic. No erythema. No pallor.  Approximately 3 mm circular biopsy with continuous venous bleeding  Psychiatric: She has a normal mood and affect.  Nursing note and vitals reviewed.    ED Treatments / Results    Labs (all labs ordered are listed, but only abnormal results are displayed) Labs Reviewed - No data to display  EKG  EKG Interpretation None       Radiology No results found.  Procedures Procedures (including critical care time)   Patient tolerance: Patient tolerated the procedure well with no immediate complications. Medications Ordered in ED Medications  lidocaine-EPINEPHrine (XYLOCAINE W/EPI) 2 %-1:200000 (PF) injection 10 mL (10 mLs Infiltration Given 03/02/17 0253)     Initial Impression / Assessment and Plan / ED Course  I have reviewed the triage vital signs and the nursing notes.  Pertinent labs & imaging results that were available during my care of the patient were reviewed by me and considered in my medical decision making (see chart for details).    Presenting with very difficult to control bleeding at biopsy site on the right foot.  Attempted to apply pressure here in the ED without success.   Used silver nitrate and after pressure holding, bleeding stopped, patient was observed and ambulated without recurrence.  Discharge home with PCP follow up. Patient was well-appearing and without complaints prior to discharge. Patient was advised to return if experiencing any worsening.  Final Clinical Impressions(s) / ED Diagnoses   Final diagnoses:  Bleeding from wound    New Prescriptions Discharge Medication List as of 03/02/2017  3:26 AM       Emeline General, PA-C 03/02/17 [id number]    Jola Schmidt, MD 03/02/17 [phone]

## 2017-03-02 NOTE — ED Notes (Signed)
Pt understood dc material. NAD noted. 

## 2017-03-05 ENCOUNTER — Encounter (HOSPITAL_COMMUNITY)
Admission: RE | Admit: 2017-03-05 | Discharge: 2017-03-05 | Disposition: A | Discharge status: Home / Self Care | Payer: Medicare Other | Source: Ambulatory Visit | Attending: Internal Medicine | Admitting: Internal Medicine

## 2017-03-05 ENCOUNTER — Encounter (HOSPITAL_COMMUNITY): Payer: Medicare Other

## 2017-03-05 DIAGNOSIS — I5022 Chronic systolic (congestive) heart failure: Secondary | ICD-10-CM | POA: Diagnosis not present

## 2017-03-05 DIAGNOSIS — K219 Gastro-esophageal reflux disease without esophagitis: Secondary | ICD-10-CM | POA: Diagnosis not present

## 2017-03-05 DIAGNOSIS — I110 Hypertensive heart disease with heart failure: Secondary | ICD-10-CM | POA: Diagnosis not present

## 2017-03-05 DIAGNOSIS — J45909 Unspecified asthma, uncomplicated: Secondary | ICD-10-CM | POA: Diagnosis not present

## 2017-03-05 DIAGNOSIS — F419 Anxiety disorder, unspecified: Secondary | ICD-10-CM | POA: Diagnosis not present

## 2017-03-05 DIAGNOSIS — Z95810 Presence of automatic (implantable) cardiac defibrillator: Secondary | ICD-10-CM | POA: Diagnosis not present

## 2017-03-06 ENCOUNTER — Telehealth: Payer: Self-pay | Admitting: *Deleted

## 2017-03-06 MED ORDER — FLUCONAZOLE 150 MG PO TABS: 150.0000 mg | ORAL_TABLET | Freq: Once | ORAL | 0 refills | Status: AC

## 2017-03-06 NOTE — Telephone Encounter (Signed)
Pt informed, Rx sent. 

## 2017-03-06 NOTE — Telephone Encounter (Signed)
Okay for Diflucan 150, office visit if no relief sooner than annual if needed.

## 2017-03-06 NOTE — Telephone Encounter (Signed)
Pt called c/o vaginal itching, no discharge or odor. Pt annual scheduled 03/13/17, asked if pill could be sent to pharmacy? Please advise

## 2017-03-07 ENCOUNTER — Encounter (HOSPITAL_COMMUNITY)
Admission: RE | Admit: 2017-03-07 | Discharge: 2017-03-07 | Disposition: A | Discharge status: Home / Self Care | Payer: Medicare Other | Source: Ambulatory Visit | Attending: Internal Medicine | Admitting: Internal Medicine

## 2017-03-07 ENCOUNTER — Encounter (HOSPITAL_COMMUNITY): Payer: Medicare Other

## 2017-03-07 DIAGNOSIS — I110 Hypertensive heart disease with heart failure: Secondary | ICD-10-CM | POA: Diagnosis not present

## 2017-03-07 DIAGNOSIS — F419 Anxiety disorder, unspecified: Secondary | ICD-10-CM | POA: Diagnosis not present

## 2017-03-07 DIAGNOSIS — Z95810 Presence of automatic (implantable) cardiac defibrillator: Secondary | ICD-10-CM | POA: Diagnosis not present

## 2017-03-07 DIAGNOSIS — J45909 Unspecified asthma, uncomplicated: Secondary | ICD-10-CM | POA: Diagnosis not present

## 2017-03-07 DIAGNOSIS — I5022 Chronic systolic (congestive) heart failure: Secondary | ICD-10-CM | POA: Diagnosis not present

## 2017-03-07 DIAGNOSIS — K219 Gastro-esophageal reflux disease without esophagitis: Secondary | ICD-10-CM | POA: Diagnosis not present

## 2017-03-08 NOTE — Progress Notes (Signed)
Cardiac Individual Treatment Plan  Patient Details  Name: DEMI TRIEU MRN: [id number] Date of Birth: 03-08-1966 Referring Provider:     CARDIAC REHAB PHASE II ORIENTATION from 01/23/2017 in [place]  Referring Provider  Glori Bickers MD      Initial Encounter Date:    CARDIAC REHAB PHASE II ORIENTATION from 01/23/2017 in [place]  Date  01/23/17  Referring Provider  Glori Bickers MD      Visit Diagnosis: Chronic systolic congestive heart failure ([place])  Patient's Home Medications on Admission:  Current Outpatient Prescriptions:  .  albuterol (PROVENTIL HFA;VENTOLIN HFA) 108 (90 Base) MCG/ACT inhaler, Inhale 1-2 puffs into the lungs every 6 (six) hours as needed for wheezing or shortness of breath., Disp: 1 Inhaler, Rfl: 0 .  aspirin 81 MG chewable tablet, Chew 81 mg by mouth daily. , Disp: , Rfl:  .  Aspirin-Acetaminophen-Caffeine (GOODY HEADACHE PO), Take 1 packet by mouth daily as needed (pain/headache)., Disp: , Rfl:  .  atorvastatin (LIPITOR) 40 MG tablet, Take 40 mg by mouth daily., Disp: , Rfl:  .  BAYER CONTOUR TEST test strip, , Disp: , Rfl: 1 .  carvedilol (COREG) 25 MG tablet, Take 1 tablet (25 mg total) by mouth 2 (two) times daily with a meal., Disp: 60 tablet, Rfl: 11 .  estradiol (ESTRACE) 1 MG tablet, Take 1 tablet (1 mg total) by mouth daily., Disp: 30 tablet, Rfl: 11 .  fluticasone (FLONASE) 50 MCG/ACT nasal spray, Place 1 spray into both nostrils 2 (two) times daily as needed for allergies or rhinitis., Disp: 16 g, Rfl: 3 .  furosemide (LASIX) 40 MG tablet, TAKE 1 TABLET (40 MG TOTAL) BY MOUTH 2 (TWO) TIMES DAILY., Disp: 60 tablet, Rfl: 3 .  glimepiride (AMARYL) 2 MG tablet, Take 2 mg by mouth daily. , Disp: , Rfl:  .  levothyroxine (SYNTHROID, LEVOTHROID) 50 MCG tablet, Take 1 tablet (50 mcg total) by mouth daily., Disp: 30 tablet, Rfl: 4 .  loratadine (CLARITIN) 10 MG tablet, Take 1 tablet  (10 mg total) by mouth daily., Disp: 30 tablet, Rfl: 2 .  PRESCRIPTION MEDICATION, Inhale into the lungs at bedtime. CPAP, Disp: , Rfl:  .  ranitidine (ZANTAC) 150 MG tablet, Take 1 tablet (150 mg total) by mouth 2 (two) times daily. (Patient taking differently: Take 150 mg by mouth daily as needed for heartburn. ), Disp: 180 tablet, Rfl: 3 .  sacubitril-valsartan (ENTRESTO) 97-103 MG, Take 1 tablet by mouth 2 (two) times daily., Disp: 60 tablet, Rfl: 11 .  sodium chloride (OCEAN) 0.65 % SOLN nasal spray, Place 2 sprays into both nostrils daily as needed for congestion., Disp: , Rfl:  No current facility-administered medications for this encounter.   Facility-Administered Medications Ordered in Other Encounters:  .  aminophylline injection 150 mg, 150 mg, Intravenous, BID PRN, Larey Dresser, MD, 150 mg at 12/24/14 1005  Past Medical History: Past Medical History:  Diagnosis Date  . AICD (automatic cardioverter/defibrillator) present 12/13/2016  . Anxiety    no meds  . Asthma   . CHF (congestive heart failure) ([place])   . Depression    no meds  . Diabetes mellitus    recent dx 11/17/11 - started med 11/18/11 type 2  . GERD (gastroesophageal reflux disease)   . Goiter 07/27/2011   non-neoplastic goiter - fine needle aspiration - benign sees dr Dwyane Dee for  . Heart murmur    dx 2 yrs ago per  pt  . Herpes genitalis in women   . Hypertension   . IBS (irritable bowel syndrome)    tx with diet per pt  . Low back pain    history  . Obesity   . Seasonal allergies   . Shortness of breath    occasional - exercise induced  . Sleep apnea    cpap broken  . Systolic heart failure    May 2011 EF 35-40%, 04/03/11 EF 25-30%, 06/27/11 EF 30-35%    Tobacco Use: History  Smoking Status  . Never Smoker  Smokeless Tobacco  . Never Used    Labs: Recent Review Flowsheet Data    Labs for ITP Cardiac and Pulmonary Rehab Latest Ref Rng & Units 09/15/2015 09/15/2015 11/09/2015 12/14/2015 04/10/2016    Cholestrol 0 - 200 mg/dL - - 192 - -   LDLCALC 0 - 99 mg/dL - - 115(H) - -   HDL >39.00 mg/dL - - 39.50 - -   Trlycerides 0.0 - 149.0 mg/dL - - 189.0(H) - -   Hemoglobin A1c - - - - 7.2(H) 7.3   HCO3 20.0 - 24.0 mEq/L 25.1(H) 26.7(H) - - -   TCO2 0 - 100 mmol/L 26 28 - - -   O2SAT % 71.0 71.0 - - -      Capillary Blood Glucose: Lab Results  Component Value Date   GLUCAP 195 (H) 02/26/2017   GLUCAP 251 (H) 02/12/2017   GLUCAP 207 (H) 02/09/2017   GLUCAP 206 (H) 02/05/2017   GLUCAP 179 (H) 02/02/2017     Exercise Target Goals:    Exercise Program Goal: Individual exercise prescription set with THRR, safety & activity barriers. Participant demonstrates ability to understand and report RPE using BORG scale, to self-measure pulse accurately, and to acknowledge the importance of the exercise prescription.  Exercise Prescription Goal: Starting with aerobic activity 30 plus minutes a day, 3 days per week for initial exercise prescription. Provide home exercise prescription and guidelines that participant acknowledges understanding prior to discharge.  Activity Barriers & Risk Stratification:     Activity Barriers & Cardiac Risk Stratification - 01/23/17 0915      Activity Barriers & Cardiac Risk Stratification   Activity Barriers Deconditioning;Joint Problems;Back Problems;Other (comment)   Comments leg pain   Cardiac Risk Stratification High      6 Minute Walk:     6 Minute Walk    Row Name 01/23/17 0913 01/23/17 1140       6 Minute Walk   Phase Initial  -    Distance 1770 feet  -    Walk Time 6 minutes  -    # of Rest Breaks 0  -    MPH  - 3.35    METS  - 4.26    RPE 12  -    VO2 Peak  - 14.9    Symptoms Yes (comment)  -    Comments fatigue! Lower leg tightness(calf region)  -    Resting HR 78 bpm  -    Resting BP 108/60  -    Max Ex. HR 92 bpm  -    Max Ex. BP 118/64  -    2 Minute Post BP  - 108/72       Oxygen Initial Assessment:   Oxygen  Re-Evaluation:   Oxygen Discharge (Final Oxygen Re-Evaluation):   Initial Exercise Prescription:     Initial Exercise Prescription - 01/23/17 1100      Date of Initial Exercise  RX and Referring Provider   Date 01/23/17   Referring Provider Glori Bickers MD     Treadmill   MPH 2.5   Grade 1   Minutes 10   METs 3.26     Bike   Level 0.8   Minutes 10   METs 2.78     NuStep   Level 3   SPM 80   Minutes 10   METs 2     Prescription Details   Frequency (times per week) 3   Duration Progress to 30 minutes of continuous aerobic without signs/symptoms of physical distress     Intensity   THRR 40-80% of Max Heartrate 68-135   Ratings of Perceived Exertion 11-15   Perceived Dyspnea 0-4     Progression   Progression Continue to progress workloads to maintain intensity without signs/symptoms of physical distress.     Resistance Training   Training Prescription Yes   Weight 3lbs   Reps 10-15      Perform Capillary Blood Glucose checks as needed.  Exercise Prescription Changes:     Exercise Prescription Changes    Row Name 02/02/17 1500 02/16/17 1226 03/06/17 1200         Response to Exercise   Blood Pressure (Admit) 124/84 104/60 124/84     Blood Pressure (Exercise) 150/84 140/84 150/84     Blood Pressure (Exit) 108/64 104/60 108/64     Heart Rate (Admit) 78 bpm 83 bpm 78 bpm     Heart Rate (Exercise) 94 bpm 99 bpm 94 bpm     Heart Rate (Exit) 78 bpm 83 bpm 78 bpm     Symptoms none noe none     Comments Pt was oriented to exercise equipment on 01/29/17   - Pt was oriented to exercise equipment on 01/29/17      Duration Continue with 30 min of aerobic exercise without signs/symptoms of physical distress. Continue with 30 min of aerobic exercise without signs/symptoms of physical distress. Continue with 30 min of aerobic exercise without signs/symptoms of physical distress.     Intensity THRR unchanged THRR unchanged THRR unchanged       Progression    Progression Continue to progress workloads to maintain intensity without signs/symptoms of physical distress. Continue to progress workloads to maintain intensity without signs/symptoms of physical distress. Continue to progress workloads to maintain intensity without signs/symptoms of physical distress.     Average METs 2.6 3.3 3.2       Resistance Training   Training Prescription Yes Yes Yes     Weight 3lbs 3lbs 3lbs     Reps 10-15 10-15 10-15     Time 10 Minutes 10 Minutes 10 Minutes       Treadmill   MPH 2.5 2.8 2.5     Grade 1 3 1      Minutes 10 10 10      METs 3.26 4.3 4.3       Bike   Level 0.8 0.8 0.8     Minutes 10 10 10      METs 2.78 2.78 2.78       NuStep   Level 3 4 4      SPM 80 80 80     Minutes 10 10 10      METs 1.9 2.8 2.5       Home Exercise Plan   Plans to continue exercise at  -  - [employer] (comment)  walking at [employer] track     Frequency  -  -  Add 2 additional days to program exercise sessions.     Initial Home Exercises Provided  -  - 02/12/17        Exercise Comments:     Exercise Comments    Row Name 01/29/17 1513 02/12/17 0955 03/06/17 1218       Exercise Comments Pt oriented to exercise equipment and responded well to exercise. Pt reported no signs of CP, dizziness and unusual SOB with exercise. Will continue to monitor pt's activity levels. Reviewed HEP on 02/12/17 Reviewed METs and goals. Pt is tolerating exercises very well; will continue to monitor pt's progress and activity levels.        Exercise Goals and Review:     Exercise Goals    Row Name 01/23/17 0916             Exercise Goals   Increase Physical Activity Yes       Intervention Provide advice, education, support and counseling about physical activity/exercise needs.;Develop an individualized exercise prescription for aerobic and resistive training based on initial evaluation findings, risk stratification, comorbidities and participant's personal goals.        Expected Outcomes Achievement of increased cardiorespiratory fitness and enhanced flexibility, muscular endurance and strength shown through measurements of functional capacity and personal statement of participant.       Increase Strength and Stamina Yes       Intervention Provide advice, education, support and counseling about physical activity/exercise needs.;Develop an individualized exercise prescription for aerobic and resistive training based on initial evaluation findings, risk stratification, comorbidities and participant's personal goals.       Expected Outcomes Achievement of increased cardiorespiratory fitness and enhanced flexibility, muscular endurance and strength shown through measurements of functional capacity and personal statement of participant.          Exercise Goals Re-Evaluation :     Exercise Goals Re-Evaluation    Row Name 02/12/17 0954 03/06/17 1217 03/06/17 1229         Exercise Goal Re-Evaluation   Exercise Goals Review Increase Physical Activity;Increase Strenth and Stamina  - Increase Physical Activity;Increase Strenth and Stamina     Comments Reviewed home exercise with pt today.  Pt plans to walk at [facility] for exercise, 2x/week in addition to coming to cardiac rehab.  Reviewed THR, pulse, RPE, sign and symptoms, and when to call 911 or MD.  Also discussed weather considerations and indoor options.  Pt voiced understanding. Pt is doing well in cardiac rehab. Pt is also very active with HEP and going to the walking track at [facility]. Pt walks ~34miles, 2x/week in addition to coming to cardiac rehab.  -     Expected Outcomes Pt will be compliant with HEP and improve on cardirespiratory fitness. Pt will be compliant with HEP and improve on cardirespiratory fitness.  -         Discharge Exercise Prescription (Final Exercise Prescription Changes):     Exercise Prescription Changes - 03/06/17 1200      Response to Exercise   Blood Pressure (Admit) 124/84    Blood Pressure (Exercise) 150/84   Blood Pressure (Exit) 108/64   Heart Rate (Admit) 78 bpm   Heart Rate (Exercise) 94 bpm   Heart Rate (Exit) 78 bpm   Symptoms none   Comments Pt was oriented to exercise equipment on 01/29/17    Duration Continue with 30 min of aerobic exercise without signs/symptoms of physical distress.   Intensity THRR unchanged     Progression  Progression Continue to progress workloads to maintain intensity without signs/symptoms of physical distress.   Average METs 3.2     Resistance Training   Training Prescription Yes   Weight 3lbs   Reps 10-15   Time 10 Minutes     Treadmill   MPH 2.5   Grade 1   Minutes 10   METs 4.3     Bike   Level 0.8   Minutes 10   METs 2.78     NuStep   Level 4   SPM 80   Minutes 10   METs 2.5     Home Exercise Plan   Plans to continue exercise at [facility] (comment)  walking at [facility] track   Frequency Add 2 additional days to program exercise sessions.   Initial Home Exercises Provided 02/12/17      Nutrition:  Target Goals: Understanding of nutrition guidelines, daily intake of sodium 1500mg , cholesterol 200mg , calories 30% from fat and 7% or less from saturated fats, daily to have 5 or more servings of fruits and vegetables.  Biometrics:     Pre Biometrics - 01/23/17 0932      Pre Biometrics   Waist Circumference 40 inches   Hip Circumference 42.5 inches   Waist to Hip Ratio 0.94 %   Triceps Skinfold 46 mm   Grip Strength 34 kg   Flexibility 14 in   Single Leg Stand 6.31 seconds         Post Biometrics - 01/23/17 1144       Post  Biometrics   % Body Fat 44.9 %      Nutrition Therapy Plan and Nutrition Goals:     Nutrition Therapy & Goals - 02/07/17 1103      Nutrition Therapy   Diet Carb Modified, Therapeutic Lifestyle Changes     Personal Nutrition Goals   Nutrition Goal Improved glycemic control as evidenced by pt's A1c trending from 7.3 to less than 7   Personal  Goal #2 Pt to identify food quantities necessary to achieve weight loss of 6-24 lb (2.7-10.9 kg) at graduation from cardiac rehab. Goal wt of 165-175 lb desired.      Intervention Plan   Intervention Prescribe, educate and counsel regarding individualized specific dietary modifications aiming towards targeted core components such as weight, hypertension, lipid management, diabetes, heart failure and other comorbidities.;Nutrition handout(s) given to patient.  1500 kcal, 5 day menu ideas; Type 2 DM Nutrition Therapy   Expected Outcomes Short Term Goal: Understand basic principles of dietary content, such as calories, fat, sodium, cholesterol and nutrients.;Long Term Goal: Adherence to prescribed nutrition plan.      Nutrition Discharge: Nutrition Scores:   Nutrition Goals Re-Evaluation:   Nutrition Goals Re-Evaluation:   Nutrition Goals Discharge (Final Nutrition Goals Re-Evaluation):   Psychosocial: Target Goals: Acknowledge presence or absence of significant depression and/or stress, maximize coping skills, provide positive support system. Participant is able to verbalize types and ability to use techniques and skills needed for reducing stress and depression.  Initial Review & Psychosocial Screening:     Initial Psych Review & Screening - 01/23/17 1125      Initial Review   Current issues with Current Stress Concerns;Current Anxiety/Panic   Source of Stress Concerns Chronic Illness;Poor Coping Skills;Family;Financial   Comments pt with many stressors due to recent cardiac events and family situation.       Family Dynamics   Good Support System? No   Strains Intra-family strains   Concerns No  support system   Comments pt has current conflict in her family unit. pt also experiencing break up with her boyfriend.  pt admits to feeling isolated and alone. pt demonstrates strong faith base but no church or social resources for support.      Barriers   Psychosocial barriers to  participate in program The patient should benefit from training in stress management and relaxation.;Psychosocial barriers identified (see note)  pt with multiple barriers including financial, transportation and lack of support system. pt offered emotional support and reassurance..       Screening Interventions   Interventions Encouraged to exercise;To provide support and resources with identified psychosocial needs;Program counselor consult  pt offered appt with Jeanella Craze and to schedule follow up with her counselor Jonita Albee.        Quality of Life Scores:     Quality of Life - 01/23/17 1131      Quality of Life Scores   Health/Function Pre 10.4 %  pt with multiple concerns about her health and disability.     Socioeconomic Pre 10.5 %  pt without financial resources awaiting disability.  pt having difficulty paying  bills.    Psych/Spiritual Pre 17.14 %  pt demonstrates strong faith base however facing many overwhelming life circumstances. pt reports active prayer life however is not connected to church for support.      Family Pre 6 %  pt has current conflict with her adult children, boyfriend and other relatives. pt feels isolated and withdrawn.    GLOBAL Pre 11.14 %  pt exhibits symptoms of poor coping strategies and being overwhelmed with current life stressors. pt accepting of offer to schedule appt with Jeanella Craze and her therapist Jonita Albee.       PHQ-9: Recent Review Flowsheet Data    Depression screen [facility] 2/9 01/29/2017 09/01/2016 08/23/2016 06/30/2016 04/10/2016   Decreased Interest 3 1 0 1 0   Down, Depressed, Hopeless 3 1 1 1  0   PHQ - 2 Score 6 2 1 2  0   Altered sleeping 3 1  3  3   -   Tired, decreased energy 2 0 3 3 -   Change in appetite 1 0 1 1 -   Feeling bad or failure about yourself  1 0 2 1 -   Trouble concentrating 1 0 1 1 -   Moving slowly or fidgety/restless 1 0 0  1  -   Suicidal thoughts 0 0 0 0 -   PHQ-9 Score 15 3 11 12  -   Difficult doing  work/chores Very difficult Somewhat difficult Somewhat difficult Somewhat difficult -     Interpretation of Total Score  Total Score Depression Severity:  1-4 = Minimal depression, 5-9 = Mild depression, 10-14 = Moderate depression, 15-19 = Moderately severe depression, 20-27 = Severe depression   Psychosocial Evaluation and Intervention:     Psychosocial Evaluation - 01/29/17 1631      Psychosocial Evaluation & Interventions   Interventions Therapist referral;Physician referral;Stress management education;Relaxation education;Encouraged to exercise with the program and follow exercise prescription   Comments pt with current situational stress, health related anxiety and lack of adequate support systems.  pt referred to reestablish care with her therapist.  appt made with Jeanella Craze 02/07/2017 @945 .  PHQ and QOL scores forwarded to pt PCP for evaluation.     Expected Outcomes pt will participate in therapeutic care to develop good coping skills and improved outlook.    Continue Psychosocial Services  Follow  up required by staff      Psychosocial Re-Evaluation:     Psychosocial Re-Evaluation    Row Name 02/05/17 1218 03/06/17 2706           Psychosocial Re-Evaluation   Current issues with Current Stress Concerns;History of Depression;Current Depression Current Stress Concerns;History of Depression;Current Depression      Comments appt made with pt therapist Raigen Jagielski for 02/15/2017.  pt also has upcoming [employer] appt.  pt is eagerly participating in CR program and displays improving outlook.  pt unable to see therapist due to insurance denial. pt has seen Jeanella Craze with good results.  pt demonstrates increased stress management and coping strategies.   pt is eagerly participating in CR program and displays improving outlook.       Expected Outcomes pt will demonstrate positive outlook with good coping skills. pt will demonstrate positive outlook with good coping skills.       Interventions Therapist referral;Encouraged to attend Cardiac Rehabilitation for the exercise;Stress management education;Relaxation education Therapist referral;Encouraged to attend Cardiac Rehabilitation for the exercise;Stress management education;Relaxation education      Continue Psychosocial Services  Follow up required by staff Follow up required by staff      Comments pt with many stressors due to recent cardiac events and family situation.    -        Initial Review   Source of Stress Concerns Chronic Illness;Poor Coping Skills;Family;Financial  -         Psychosocial Discharge (Final Psychosocial Re-Evaluation):     Psychosocial Re-Evaluation - 03/06/17 2376      Psychosocial Re-Evaluation   Current issues with Current Stress Concerns;History of Depression;Current Depression   Comments pt unable to see therapist due to insurance denial. pt has seen Jeanella Craze with good results.  pt demonstrates increased stress management and coping strategies.   pt is eagerly participating in CR program and displays improving outlook.    Expected Outcomes pt will demonstrate positive outlook with good coping skills.   Interventions Therapist referral;Encouraged to attend Cardiac Rehabilitation for the exercise;Stress management education;Relaxation education   Continue Psychosocial Services  Follow up required by staff      Vocational Rehabilitation: Provide vocational rehab assistance to qualifying candidates.   Vocational Rehab Evaluation & Intervention:     Vocational Rehab - 01/23/17 1151      Initial Vocational Rehab Evaluation & Intervention   Assessment shows need for Vocational Rehabilitation No      Education: Education Goals: Education classes will be provided on a weekly basis, covering required topics. Participant will state understanding/return demonstration of topics presented.  Learning Barriers/Preferences:     Learning Barriers/Preferences - 01/23/17 0914       Learning Barriers/Preferences   Learning Barriers Sight   Learning Preferences Written Material;Pictoral;Video      Education Topics: Count Your Pulse:  -Group instruction provided by verbal instruction, demonstration, patient participation and written materials to support subject.  Instructors address importance of being able to find your pulse and how to count your pulse when at home without a heart monitor.  Patients get hands on experience counting their pulse with staff help and individually.   Heart Attack, Angina, and Risk Factor Modification:  -Group instruction provided by verbal instruction, video, and written materials to support subject.  Instructors address signs and symptoms of angina and heart attacks.    Also discuss risk factors for heart disease and how to make changes to improve heart health risk factors.  CARDIAC REHAB PHASE II EXERCISE from 02/23/2017 in [place]  Date  02/14/17  Instruction Review Code  2- meets goals/outcomes      Functional Fitness:  -Group instruction provided by verbal instruction, demonstration, patient participation, and written materials to support subject.  Instructors address safety measures for doing things around the house.  Discuss how to get up and down off the floor, how to pick things up properly, how to safely get out of a chair without assistance, and balance training.   CARDIAC REHAB PHASE II EXERCISE from 02/23/2017 in [place]  Date  02/09/17  Instruction Review Code  2- meets goals/outcomes      Meditation and Mindfulness:  -Group instruction provided by verbal instruction, patient participation, and written materials to support subject.  Instructor addresses importance of mindfulness and meditation practice to help reduce stress and improve awareness.  Instructor also leads participants through a meditation exercise.    Stretching for Flexibility and  Mobility:  -Group instruction provided by verbal instruction, patient participation, and written materials to support subject.  Instructors lead participants through series of stretches that are designed to increase flexibility thus improving mobility.  These stretches are additional exercise for major muscle groups that are typically performed during regular warm up and cool down.   CARDIAC REHAB PHASE II EXERCISE from 02/23/2017 in [place]  Date  02/16/17  Educator  EP  Instruction Review Code  2- meets goals/outcomes      Hands Only CPR:  -Group verbal, video, and participation provides a basic overview of AHA guidelines for community CPR. Role-play of emergencies allow participants the opportunity to practice calling for help and chest compression technique with discussion of AED use.   Hypertension: -Group verbal and written instruction that provides a basic overview of hypertension including the most recent diagnostic guidelines, risk factor reduction with self-care instructions and medication management.    Nutrition I class: Heart Healthy Eating:  -Group instruction provided by PowerPoint slides, verbal discussion, and written materials to support subject matter. The instructor gives an explanation and review of the Therapeutic Lifestyle Changes diet recommendations, which includes a discussion on lipid goals, dietary fat, sodium, fiber, plant stanol/sterol esters, sugar, and the components of a well-balanced, healthy diet.   Nutrition II class: Lifestyle Skills:  -Group instruction provided by PowerPoint slides, verbal discussion, and written materials to support subject matter. The instructor gives an explanation and review of label reading, grocery shopping for heart health, heart healthy recipe modifications, and ways to make healthier choices when eating out.   Diabetes Question & Answer:  -Group instruction provided by PowerPoint slides,  verbal discussion, and written materials to support subject matter. The instructor gives an explanation and review of diabetes co-morbidities, pre- and post-prandial blood glucose goals, pre-exercise blood glucose goals, signs, symptoms, and treatment of hypoglycemia and hyperglycemia, and foot care basics.   CARDIAC REHAB PHASE II EXERCISE from 02/23/2017 in [place] [facility]  Date  02/23/17  Educator  RD  Instruction Review Code  2- meets goals/outcomes      Diabetes Blitz:  -Group instruction provided by PowerPoint slides, verbal discussion, and written materials to support subject matter. The instructor gives an explanation and review of the physiology behind type 1 and type 2 diabetes, diabetes medications and rational behind using different medications, pre- and post-prandial blood glucose recommendations and Hemoglobin A1c goals, diabetes diet, and exercise including blood glucose  guidelines for exercising safely.    Portion Distortion:  -Group instruction provided by PowerPoint slides, verbal discussion, written materials, and food models to support subject matter. The instructor gives an explanation of serving size versus portion size, changes in portions sizes over the last 20 years, and what consists of a serving from each food group.   Stress Management:  -Group instruction provided by verbal instruction, video, and written materials to support subject matter.  Instructors review role of stress in heart disease and how to cope with stress positively.     Exercising on Your Own:  -Group instruction provided by verbal instruction, power point, and written materials to support subject.  Instructors discuss benefits of exercise, components of exercise, frequency and intensity of exercise, and end points for exercise.  Also discuss use of nitroglycerin and activating EMS.  Review options of places to exercise outside of rehab.  Review guidelines for sex with heart  disease.   CARDIAC REHAB PHASE II EXERCISE from 02/23/2017 in [place]  Date  02/07/17  Instruction Review Code  2- meets goals/outcomes      Cardiac Drugs I:  -Group instruction provided by verbal instruction and written materials to support subject.  Instructor reviews cardiac drug classes: antiplatelets, anticoagulants, beta blockers, and statins.  Instructor discusses reasons, side effects, and lifestyle considerations for each drug class.   Cardiac Drugs II:  -Group instruction provided by verbal instruction and written materials to support subject.  Instructor reviews cardiac drug classes: angiotensin converting enzyme inhibitors (ACE-I), angiotensin II receptor blockers (ARBs), nitrates, and calcium channel blockers.  Instructor discusses reasons, side effects, and lifestyle considerations for each drug class.   Anatomy and Physiology of the Circulatory System:  Group verbal and written instruction and models provide basic cardiac anatomy and physiology, with the coronary electrical and arterial systems. Review of: AMI, Angina, Valve disease, Heart Failure, Peripheral Artery Disease, Cardiac Arrhythmia, Pacemakers, and the ICD.   CARDIAC REHAB PHASE II EXERCISE from 02/23/2017 in [place]  Date  02/21/17  Instruction Review Code  2- meets goals/outcomes      Other Education:  -Group or individual verbal, written, or video instructions that support the educational goals of the cardiac rehab program.   Knowledge Questionnaire Score:     Knowledge Questionnaire Score - 01/23/17 0901      Knowledge Questionnaire Score   Pre Score 21/24      Core Components/Risk Factors/Patient Goals at Admission:     Personal Goals and Risk Factors at Admission - 01/23/17 0933      Core Components/Risk Factors/Patient Goals on Admission   Hypertension Yes   Intervention Provide education on lifestyle modifcations including  regular physical activity/exercise, weight management, moderate sodium restriction and increased consumption of fresh fruit, vegetables, and low fat dairy, alcohol moderation, and smoking cessation.;Monitor prescription use compliance.   Expected Outcomes Short Term: Continued assessment and intervention until BP is < 140/5mm HG in hypertensive participants. < 130/58mm HG in hypertensive participants with diabetes, heart failure or chronic kidney disease.;Long Term: Maintenance of blood pressure at goal levels.   Stress Yes   Intervention Offer individual and/or small group education and counseling on adjustment to heart disease, stress management and health-related lifestyle change. Teach and support self-help strategies.;Refer participants experiencing significant psychosocial distress to appropriate mental health specialists for further evaluation and treatment. When possible, include family members and significant others in education/counseling sessions.   Expected Outcomes Short Term: Participant demonstrates  changes in health-related behavior, relaxation and other stress management skills, ability to obtain effective social support, and compliance with psychotropic medications if prescribed.;Long Term: Emotional wellbeing is indicated by absence of clinically significant psychosocial distress or social isolation.      Core Components/Risk Factors/Patient Goals Review:      Goals and Risk Factor Review    Row Name 02/05/17 1217 03/06/17 0814           Core Components/Risk Factors/Patient Goals Review   Personal Goals Review Weight Management/Obesity;Heart Failure;Hypertension;Lipids;Diabetes;Other;Stress Weight Management/Obesity;Heart Failure;Hypertension;Lipids;Diabetes;Other;Stress      Review pt with multiple CAD RF demonstrates eagerness to participate in CR program.  pt with multiple CAD RF demonstrates eagerness to participate in CR program.       Expected Outcomes pt will  participate in CR exercise, nutrition and lifestyle modification education classes to decrease overall CAD RF. pt will participate in CR exercise, nutrition and lifestyle modification education classes to decrease overall CAD RF.         Core Components/Risk Factors/Patient Goals at Discharge (Final Review):      Goals and Risk Factor Review - 03/06/17 0814      Core Components/Risk Factors/Patient Goals Review   Personal Goals Review Weight Management/Obesity;Heart Failure;Hypertension;Lipids;Diabetes;Other;Stress   Review pt with multiple CAD RF demonstrates eagerness to participate in CR program.    Expected Outcomes pt will participate in CR exercise, nutrition and lifestyle modification education classes to decrease overall CAD RF.      ITP Comments:     ITP Comments    Row Name 01/23/17 0856 02/05/17 1216 03/08/17 0947       ITP Comments Dr. Fransico Him, Medical Director  Dr. Fransico Him, Medical Director  Dr. Fransico Him, Medical Director         Comments: Pt is making expected progress toward personal goals after completing 15 sessions. Recommend continued exercise and life style modification education including  stress management and relaxation techniques to decrease cardiac risk profile.

## 2017-03-09 ENCOUNTER — Encounter (HOSPITAL_COMMUNITY): Payer: Medicare Other

## 2017-03-09 ENCOUNTER — Telehealth: Payer: Self-pay | Admitting: Endocrinology

## 2017-03-09 ENCOUNTER — Encounter (HOSPITAL_COMMUNITY)
Admission: RE | Admit: 2017-03-09 | Discharge: 2017-03-09 | Disposition: A | Discharge status: Home / Self Care | Payer: Medicare Other | Source: Ambulatory Visit | Attending: Internal Medicine | Admitting: Internal Medicine

## 2017-03-09 DIAGNOSIS — I110 Hypertensive heart disease with heart failure: Secondary | ICD-10-CM | POA: Diagnosis not present

## 2017-03-09 DIAGNOSIS — K219 Gastro-esophageal reflux disease without esophagitis: Secondary | ICD-10-CM | POA: Diagnosis not present

## 2017-03-09 DIAGNOSIS — I5022 Chronic systolic (congestive) heart failure: Secondary | ICD-10-CM | POA: Diagnosis not present

## 2017-03-09 DIAGNOSIS — Z95810 Presence of automatic (implantable) cardiac defibrillator: Secondary | ICD-10-CM | POA: Diagnosis not present

## 2017-03-09 DIAGNOSIS — J45909 Unspecified asthma, uncomplicated: Secondary | ICD-10-CM | POA: Diagnosis not present

## 2017-03-09 DIAGNOSIS — F419 Anxiety disorder, unspecified: Secondary | ICD-10-CM | POA: Diagnosis not present

## 2017-03-09 NOTE — Telephone Encounter (Signed)
Patient was concerned that the levothyroxine (SYNTHROID, LEVOTHROID) 50 MCG tablet she was on was the one recalled. I advised the patient that she is not on the version of levothyroxine that was recalled. Please advise if incorrect, otherwise no further action required.

## 2017-03-12 ENCOUNTER — Ambulatory Visit (INDEPENDENT_AMBULATORY_CARE_PROVIDER_SITE_OTHER): Payer: Medicaid Other | Admitting: Internal Medicine

## 2017-03-12 ENCOUNTER — Encounter (HOSPITAL_COMMUNITY)
Admission: RE | Admit: 2017-03-12 | Discharge: 2017-03-12 | Disposition: A | Discharge status: Home / Self Care | Payer: Medicare Other | Source: Ambulatory Visit | Attending: Internal Medicine | Admitting: Internal Medicine

## 2017-03-12 ENCOUNTER — Ambulatory Visit (HOSPITAL_COMMUNITY)
Admission: RE | Admit: 2017-03-12 | Discharge: 2017-03-12 | Disposition: A | Discharge status: Home / Self Care | Payer: Medicare Other | Source: Ambulatory Visit | Attending: Internal Medicine | Admitting: Internal Medicine

## 2017-03-12 ENCOUNTER — Encounter (HOSPITAL_COMMUNITY): Payer: Medicare Other

## 2017-03-12 VITALS — BP 117/77 | HR 70 | Temp 97.6°F | Ht 66.0 in | Wt 192.8 lb

## 2017-03-12 DIAGNOSIS — R110 Nausea: Secondary | ICD-10-CM | POA: Diagnosis not present

## 2017-03-12 DIAGNOSIS — R9431 Abnormal electrocardiogram [ECG] [EKG]: Secondary | ICD-10-CM | POA: Diagnosis not present

## 2017-03-12 DIAGNOSIS — R1013 Epigastric pain: Secondary | ICD-10-CM | POA: Insufficient documentation

## 2017-03-12 DIAGNOSIS — I5022 Chronic systolic (congestive) heart failure: Secondary | ICD-10-CM

## 2017-03-12 DIAGNOSIS — I110 Hypertensive heart disease with heart failure: Secondary | ICD-10-CM | POA: Diagnosis not present

## 2017-03-12 DIAGNOSIS — J45909 Unspecified asthma, uncomplicated: Secondary | ICD-10-CM | POA: Diagnosis not present

## 2017-03-12 DIAGNOSIS — F419 Anxiety disorder, unspecified: Secondary | ICD-10-CM | POA: Diagnosis not present

## 2017-03-12 DIAGNOSIS — Z8719 Personal history of other diseases of the digestive system: Secondary | ICD-10-CM | POA: Diagnosis not present

## 2017-03-12 DIAGNOSIS — Z95810 Presence of automatic (implantable) cardiac defibrillator: Secondary | ICD-10-CM | POA: Diagnosis not present

## 2017-03-12 DIAGNOSIS — R079 Chest pain, unspecified: Secondary | ICD-10-CM | POA: Insufficient documentation

## 2017-03-12 DIAGNOSIS — K219 Gastro-esophageal reflux disease without esophagitis: Secondary | ICD-10-CM | POA: Diagnosis not present

## 2017-03-12 LAB — GLUCOSE, CAPILLARY: Glucose-Capillary: 222 mg/dL — ABNORMAL HIGH (ref 65–99)

## 2017-03-12 MED ORDER — PANTOPRAZOLE SODIUM 40 MG PO TBEC: 40.0000 mg | DELAYED_RELEASE_TABLET | Freq: Every day | ORAL | 0 refills | Status: DC

## 2017-03-12 NOTE — Telephone Encounter (Signed)
Please advise. According to [facility] the 'MCG' are not the levothyroxine's effected. Only the mixed medications like Armour Thyroid or Nature Thyroid; those dosages end in 'MG'.  Thanks!

## 2017-03-12 NOTE — Assessment & Plan Note (Addendum)
History of present illness States she ate pizza for dinner last night and 2 eggs for breakfast this morning. She then had a regular bowel movement. At cardiac rehabilitation today, patient started experiencing epigastric abdominal pain and burning which radiated to her chest in the substernal area. Denies having any associated dizziness or diaphoresis. States she felt nauseous but did not vomit. She describes the pain as constant in nature and 7 out of 10 in intensity. Denies having any fevers or chills. Reports having occasional heartburn for which she is currently not taking any medications. Denies having any melena or hematochezia. States she was supposed to get an EGD done by her GI specialist Dr. Benson Norway but the procedure was delayed due to defibrillator placement back in May this year.   Assessment Her current presentation is likely secondary to GERD versus peptic ulcer disease versus dyspepsia. Other differentials include cholelithiasis, pancreatitis, and viral gastroenteritis. Colonoscopy done in September 2016 showing rectal polyp and diverticula. Noted to have mild epigastric tenderness on exam; no rebound, guarding, or rigidity. Although patient mentions an EGD, she warrants a trial of the PPI first and does not have any red flags such as unintentional weight loss. Her pain is less likely be cardiac in etiology. EKG done at this visit showing nonspecific changes in the precordial leads. EKGs done in the past have been variable as well. It is reassuring that she had normal coronary arteries on cath done in every 2017. At present, patient appeared very comfortable on exam and her pain did not seem to be cardiac in etiology.  Plan -Protonix 40 mg daily -Zantac 150 mg twice daily as needed -Check CMP and lipase -Advised her to follow up with her cardiologist and avoid cardiac rehabilitation until she feels well.  Addendum 03/13/2017: AST, ALT, and T-bili normal. Alkaline phosphatase borderline  elevated at 121. Lipase normal. Tried calling the patient but could not reach her over the phone.

## 2017-03-12 NOTE — Progress Notes (Addendum)
Incomplete Session Note  Patient Details  Name: Susan Peck MRN: [id number] Date of Birth: 11-23-1965 Referring Provider:     CARDIAC REHAB PHASE II ORIENTATION from 01/23/2017 in [place]  Referring Provider  Bensimhon, Quillian Quince MD      Burnis Medin did not complete her rehab session.  Pt  C/o nausea and lower abdominal discomfort.  Clear emesis x1.  Pt reports nausea off and on since Friday.  Pt denies change in routine although admits she has not taken zantac.    Pt also admits to inconsistent followup with GI.    Pt transported via [facility] to Internal medicine clinic for work in appt.  Pt instructed to hold cardiac rehab until MD clearance.  Understanding verbalized. Andi Hence, RN, BSN  Cardiac Pulmonary Rehab

## 2017-03-12 NOTE — Patient Instructions (Signed)
[name] it was nice meeting you today.  -Start taking Protonix 40 mg once daily. Take it 30 minutes before breakfast.  -Take Zantac 150 mg twice daily as needed if you continue to have heartburn.  -Return to the clinic in 1 month for a follow-up.

## 2017-03-12 NOTE — Progress Notes (Signed)
   CC: Abdominal pain and nausea  HPI:  [name] is a 51 y.o. female with a past medical history of conditions listed below presenting to the clinic complaining of abdominal pain and nausea. Please see problem based charting for the status of the patient's current and chronic medical conditions.   Past Medical History:  Diagnosis Date  . AICD (automatic cardioverter/defibrillator) present 12/13/2016  . Anxiety    no meds  . Asthma   . CHF (congestive heart failure) ([place])   . Depression    no meds  . Diabetes mellitus    recent dx 11/17/11 - started med 11/18/11 type 2  . GERD (gastroesophageal reflux disease)   . Goiter 07/27/2011   non-neoplastic goiter - fine needle aspiration - benign sees dr Dwyane Dee for  . Heart murmur    dx 2 yrs ago per pt  . Herpes genitalis in women   . Hypertension   . IBS (irritable bowel syndrome)    tx with diet per pt  . Low back pain    history  . Obesity   . Seasonal allergies   . Shortness of breath    occasional - exercise induced  . Sleep apnea    cpap broken  . Systolic heart failure    May 2011 EF 35-40%, 04/03/11 EF 25-30%, 06/27/11 EF 30-35%   Review of Systems: Pertinent positives mentioned in HPI. Remainder of all ROS negative.   Physical Exam:  Vitals:   03/12/17 0955  BP: 117/77  Pulse: 70  Temp: 97.6 F (36.4 C)  TempSrc: Oral  SpO2: 100%  Weight: 192 lb 12.8 oz (87.5 kg)  Height: 5\' 6"  (1.676 m)   Physical Exam  Constitutional: She is oriented to person, place, and time. She appears well-developed and well-nourished. No distress.  Eyes: Right eye exhibits no discharge. Left eye exhibits no discharge.  Cardiovascular: Normal rate, regular rhythm and intact distal pulses.  Exam reveals no gallop and no friction rub.   Pulmonary/Chest: Effort normal and breath sounds normal. No respiratory distress. She has no wheezes. She has no rales.  Abdominal: Soft. Bowel sounds are normal. She exhibits no distension. There  is tenderness. There is no rebound and no guarding.  Mild epigastric tenderness  Musculoskeletal: She exhibits no edema.  Neurological: She is alert and oriented to person, place, and time.  Skin: Skin is warm and dry.    Assessment & Plan:   See Encounters Tab for problem based charting.  Patient discussed with Dr. Angelia Mould

## 2017-03-12 NOTE — Telephone Encounter (Signed)
Called patient and left a voice message to reassure patient about her levothyroxine from note from [place] and Dr. Cruzita Lederer.

## 2017-03-13 ENCOUNTER — Encounter: Payer: Self-pay | Admitting: Women's Health

## 2017-03-13 ENCOUNTER — Ambulatory Visit (INDEPENDENT_AMBULATORY_CARE_PROVIDER_SITE_OTHER): Payer: Medicaid Other | Admitting: Women's Health

## 2017-03-13 VITALS — BP 128/80 | Ht 66.0 in | Wt 190.0 lb

## 2017-03-13 DIAGNOSIS — Z0000 Encounter for general adult medical examination without abnormal findings: Secondary | ICD-10-CM | POA: Diagnosis not present

## 2017-03-13 DIAGNOSIS — Z113 Encounter for screening for infections with a predominantly sexual mode of transmission: Secondary | ICD-10-CM

## 2017-03-13 DIAGNOSIS — Z01419 Encounter for gynecological examination (general) (routine) without abnormal findings: Secondary | ICD-10-CM | POA: Diagnosis not present

## 2017-03-13 LAB — CMP14 + ANION GAP
A/G RATIO: 1.4 (ref 1.2–2.2)
ALBUMIN: 4.1 g/dL (ref 3.5–5.5)
ALT: 14 IU/L (ref 0–32)
AST: 18 IU/L (ref 0–40)
Alkaline Phosphatase: 121 IU/L — ABNORMAL HIGH (ref 39–117)
Anion Gap: 16 mmol/L (ref 10.0–18.0)
BILIRUBIN TOTAL: 0.4 mg/dL (ref 0.0–1.2)
BUN / CREAT RATIO: 23 (ref 9–23)
BUN: 13 mg/dL (ref 6–24)
CHLORIDE: 97 mmol/L (ref 96–106)
CO2: 25 mmol/L (ref 20–29)
Calcium: 9.6 mg/dL (ref 8.7–10.2)
Creatinine, Ser: 0.57 mg/dL (ref 0.57–1.00)
GFR calc non Af Amer: 108 mL/min/{1.73_m2} (ref >59)
GFR, EST AFRICAN AMERICAN: 124 mL/min/{1.73_m2} (ref >59)
GLOBULIN, TOTAL: 3 g/dL (ref 1.5–4.5)
Glucose: 205 mg/dL — ABNORMAL HIGH (ref 65–99)
Potassium: 4.3 mmol/L (ref 3.5–5.2)
Sodium: 138 mmol/L (ref 134–144)
TOTAL PROTEIN: 7.1 g/dL (ref 6.0–8.5)

## 2017-03-13 LAB — LIPASE: Lipase: 35 U/L (ref 14–72)

## 2017-03-13 NOTE — Progress Notes (Signed)
Susan Peck 05/28/1966 [id number]    History:    Presents for annual exam.  TVH for fibroids on no HRT, estradiol in the past. 2017 normal DEXA. Normal mammogram and Pap history. 2016 benign colon polyp 10 year follow-up. May 2018 implantable defibrillator placed, history of CHF, hypertension, IBS, asthma. Partner infidelity.  Past medical history, past surgical history, family history and social history were all reviewed and documented in the EPIC chart. Tearful most of visit due to infidelity of partner, strained relationship with children, financial problems, hoping to be approved for disability.  ROS:  A ROS was performed and pertinent positives and negatives are included.  Exam:  Vitals:   03/13/17 1053  BP: 128/80  Weight: 190 lb (86.2 kg)  Height: 5\' 6"  (1.676 m)   Body mass index is 30.67 kg/m.   General appearance:  Normal Thyroid:  Symmetrical, normal in size, without palpable masses or nodularity. Respiratory  Auscultation:  Clear without wheezing or rhonchi Cardiovascular  Auscultation:  Regular rate, without rubs, murmurs or gallops  Edema/varicosities:  Not grossly evident Abdominal  Soft,nontender, without masses, guarding or rebound.  Liver/spleen:  No organomegaly noted  Hernia:  None appreciated  Skin  Inspection:  Grossly normal   Breasts: Examined lying and sitting.     Right: Without masses, retractions, discharge or axillary adenopathy.     Left: Without masses, retractions, discharge or axillary adenopathy. Gentitourinary   Inguinal/mons:  Normal without inguinal adenopathy  External genitalia:  Normal  BUS/Urethra/Skene's glands:  Normal  Vagina:  Normal  Cervix:  And uterus absent  Adnexa/parametria:     Rt: Without masses or tenderness.   Lt: Without masses or tenderness.  Anus and perineum: Normal  Digital rectal exam: Normal sphincter tone without palpated masses or tenderness  Assessment/Plan:  51 y.o. SBF G7 P3  for annual exam.     TVH fibroids no HRT CHF/hypertension/implantable defibrillator-primary care/cardiologist manages Situational stress STD screen  Plan: Encouraged counseling, leisure activities, daily walk. SBE's, continue annual screening mammogram, calcium rich diet, vitamin D 2000 daily encouraged. Prevnar 13 reviewed and recommended will review with cardiologist. History of asthma. GC/Chlamydia, HIV, hep B, C, RPR.    [place], 11:59 AM 03/13/2017

## 2017-03-13 NOTE — Patient Instructions (Signed)
Health Maintenance for Postmenopausal Women Menopause is a normal process in which your reproductive ability comes to an end. This process happens gradually over a span of months to years, usually between the ages of 22 and 9. Menopause is complete when you have missed 12 consecutive menstrual periods. It is important to talk with your health care provider about some of the most common conditions that affect postmenopausal women, such as heart disease, cancer, and bone loss (osteoporosis). Adopting a healthy lifestyle and getting preventive care can help to promote your health and wellness. Those actions can also lower your chances of developing some of these common conditions. What should I know about menopause? During menopause, you may experience a number of symptoms, such as:  Moderate-to-severe hot flashes.  Night sweats.  Decrease in sex drive.  Mood swings.  Headaches.  Tiredness.  Irritability.  Memory problems.  Insomnia.  Choosing to treat or not to treat menopausal changes is an individual decision that you make with your health care provider. What should I know about hormone replacement therapy and supplements? Hormone therapy products are effective for treating symptoms that are associated with menopause, such as hot flashes and night sweats. Hormone replacement carries certain risks, especially as you become older. If you are thinking about using estrogen or estrogen with progestin treatments, discuss the benefits and risks with your health care provider. What should I know about heart disease and stroke? Heart disease, heart attack, and stroke become more likely as you age. This may be due, in part, to the hormonal changes that your body experiences during menopause. These can affect how your body processes dietary fats, triglycerides, and cholesterol. Heart attack and stroke are both medical emergencies. There are many things that you can do to help prevent heart disease  and stroke:  Have your blood pressure checked at least every 1-2 years. High blood pressure causes heart disease and increases the risk of stroke.  If you are 53-22 years old, ask your health care provider if you should take aspirin to prevent a heart attack or a stroke.  Do not use any tobacco products, including cigarettes, chewing tobacco, or electronic cigarettes. If you need help quitting, ask your health care provider.  It is important to eat a healthy diet and maintain a healthy weight. ? Be sure to include plenty of vegetables, fruits, low-fat dairy products, and lean protein. ? Avoid eating foods that are high in solid fats, added sugars, or salt (sodium).  Get regular exercise. This is one of the most important things that you can do for your health. ? Try to exercise for at least 150 minutes each week. The type of exercise that you do should increase your heart rate and make you sweat. This is known as moderate-intensity exercise. ? Try to do strengthening exercises at least twice each week. Do these in addition to the moderate-intensity exercise.  Know your numbers.Ask your health care provider to check your cholesterol and your blood glucose. Continue to have your blood tested as directed by your health care provider.  What should I know about cancer screening? There are several types of cancer. Take the following steps to reduce your risk and to catch any cancer development as early as possible. Breast Cancer  Practice breast self-awareness. ? This means understanding how your breasts normally appear and feel. ? It also means doing regular breast self-exams. Let your health care provider know about any changes, no matter how small.  If you are 40  or older, have a clinician do a breast exam (clinical breast exam or CBE) every year. Depending on your age, family history, and medical history, it may be recommended that you also have a yearly breast X-ray (mammogram).  If you  have a family history of breast cancer, talk with your health care provider about genetic screening.  If you are at high risk for breast cancer, talk with your health care provider about having an MRI and a mammogram every year.  Breast cancer (BRCA) gene test is recommended for women who have family members with BRCA-related cancers. Results of the assessment will determine the need for genetic counseling and BRCA1 and for BRCA2 testing. BRCA-related cancers include these types: ? Breast. This occurs in males or females. ? Ovarian. ? Tubal. This may also be called fallopian tube cancer. ? Cancer of the abdominal or pelvic lining (peritoneal cancer). ? Prostate. ? Pancreatic.  Cervical, Uterine, and Ovarian Cancer Your health care provider may recommend that you be screened regularly for cancer of the pelvic organs. These include your ovaries, uterus, and vagina. This screening involves a pelvic exam, which includes checking for microscopic changes to the surface of your cervix (Pap test).  For women ages 21-65, health care providers may recommend a pelvic exam and a Pap test every three years. For women ages 79-65, they may recommend the Pap test and pelvic exam, combined with testing for human papilloma virus (HPV), every five years. Some types of HPV increase your risk of cervical cancer. Testing for HPV may also be done on women of any age who have unclear Pap test results.  Other health care providers may not recommend any screening for nonpregnant women who are considered low risk for pelvic cancer and have no symptoms. Ask your health care provider if a screening pelvic exam is right for you.  If you have had past treatment for cervical cancer or a condition that could lead to cancer, you need Pap tests and screening for cancer for at least 20 years after your treatment. If Pap tests have been discontinued for you, your risk factors (such as having a new sexual partner) need to be  reassessed to determine if you should start having screenings again. Some women have medical problems that increase the chance of getting cervical cancer. In these cases, your health care provider may recommend that you have screening and Pap tests more often.  If you have a family history of uterine cancer or ovarian cancer, talk with your health care provider about genetic screening.  If you have vaginal bleeding after reaching menopause, tell your health care provider.  There are currently no reliable tests available to screen for ovarian cancer.  Lung Cancer Lung cancer screening is recommended for adults 69-62 years old who are at high risk for lung cancer because of a history of smoking. A yearly low-dose CT scan of the lungs is recommended if you:  Currently smoke.  Have a history of at least 30 pack-years of smoking and you currently smoke or have quit within the past 15 years. A pack-year is smoking an average of one pack of cigarettes per day for one year.  Yearly screening should:  Continue until it has been 15 years since you quit.  Stop if you develop a health problem that would prevent you from having lung cancer treatment.  Colorectal Cancer  This type of cancer can be detected and can often be prevented.  Routine colorectal cancer screening usually begins at  age 42 and continues through age 45.  If you have risk factors for colon cancer, your health care provider may recommend that you be screened at an earlier age.  If you have a family history of colorectal cancer, talk with your health care provider about genetic screening.  Your health care provider may also recommend using home test kits to check for hidden blood in your stool.  A small camera at the end of a tube can be used to examine your colon directly (sigmoidoscopy or colonoscopy). This is done to check for the earliest forms of colorectal cancer.  Direct examination of the colon should be repeated every  5-10 years until age 71. However, if early forms of precancerous polyps or small growths are found or if you have a family history or genetic risk for colorectal cancer, you may need to be screened more often.  Skin Cancer  Check your skin from head to toe regularly.  Monitor any moles. Be sure to tell your health care provider: ? About any new moles or changes in moles, especially if there is a change in a mole's shape or color. ? If you have a mole that is larger than the size of a pencil eraser.  If any of your family members has a history of skin cancer, especially at a Inis Borneman age, talk with your health care provider about genetic screening.  Always use sunscreen. Apply sunscreen liberally and repeatedly throughout the day.  Whenever you are outside, protect yourself by wearing long sleeves, pants, a wide-brimmed hat, and sunglasses.  What should I know about osteoporosis? Osteoporosis is a condition in which bone destruction happens more quickly than new bone creation. After menopause, you may be at an increased risk for osteoporosis. To help prevent osteoporosis or the bone fractures that can happen because of osteoporosis, the following is recommended:  If you are 46-71 years old, get at least 1,000 mg of calcium and at least 600 mg of vitamin D per day.  If you are older than age 55 but younger than age 65, get at least 1,200 mg of calcium and at least 600 mg of vitamin D per day.  If you are older than age 54, get at least 1,200 mg of calcium and at least 800 mg of vitamin D per day.  Smoking and excessive alcohol intake increase the risk of osteoporosis. Eat foods that are rich in calcium and vitamin D, and do weight-bearing exercises several times each week as directed by your health care provider. What should I know about how menopause affects my mental health? Depression may occur at any age, but it is more common as you become older. Common symptoms of depression  include:  Low or sad mood.  Changes in sleep patterns.  Changes in appetite or eating patterns.  Feeling an overall lack of motivation or enjoyment of activities that you previously enjoyed.  Frequent crying spells.  Talk with your health care provider if you think that you are experiencing depression. What should I know about immunizations? It is important that you get and maintain your immunizations. These include:  Tetanus, diphtheria, and pertussis (Tdap) booster vaccine.  Influenza every year before the flu season begins.  Pneumonia vaccine.  Shingles vaccine.  Your health care provider may also recommend other immunizations. This information is not intended to replace advice given to you by your health care provider. Make sure you discuss any questions you have with your health care provider. Document Released: 09/01/2005  Document Revised: 01/28/2016 Document Reviewed: 04/13/2015 Elsevier Interactive Patient Education  2018 [employer]  

## 2017-03-14 ENCOUNTER — Encounter (HOSPITAL_COMMUNITY)
Admission: RE | Admit: 2017-03-14 | Discharge: 2017-03-14 | Disposition: A | Discharge status: Home / Self Care | Payer: Medicare Other | Source: Ambulatory Visit | Attending: Internal Medicine | Admitting: Internal Medicine

## 2017-03-14 ENCOUNTER — Encounter: Payer: Self-pay | Admitting: Women's Health

## 2017-03-14 ENCOUNTER — Encounter (HOSPITAL_COMMUNITY): Payer: Medicare Other

## 2017-03-14 DIAGNOSIS — Z95810 Presence of automatic (implantable) cardiac defibrillator: Secondary | ICD-10-CM | POA: Diagnosis not present

## 2017-03-14 DIAGNOSIS — I110 Hypertensive heart disease with heart failure: Secondary | ICD-10-CM | POA: Diagnosis not present

## 2017-03-14 DIAGNOSIS — J45909 Unspecified asthma, uncomplicated: Secondary | ICD-10-CM | POA: Diagnosis not present

## 2017-03-14 DIAGNOSIS — K219 Gastro-esophageal reflux disease without esophagitis: Secondary | ICD-10-CM | POA: Diagnosis not present

## 2017-03-14 DIAGNOSIS — I5022 Chronic systolic (congestive) heart failure: Secondary | ICD-10-CM

## 2017-03-14 DIAGNOSIS — F419 Anxiety disorder, unspecified: Secondary | ICD-10-CM | POA: Diagnosis not present

## 2017-03-14 LAB — HIV ANTIBODY (ROUTINE TESTING W REFLEX): HIV 1&2 Ab, 4th Generation: NONREACTIVE

## 2017-03-14 LAB — GLUCOSE, CAPILLARY: Glucose-Capillary: 269 mg/dL — ABNORMAL HIGH (ref 65–99)

## 2017-03-14 LAB — GC/CHLAMYDIA PROBE AMP
CT PROBE, AMP APTIMA: NOT DETECTED
GC PROBE AMP APTIMA: NOT DETECTED

## 2017-03-14 LAB — HEPATITIS C ANTIBODY: HCV AB: NONREACTIVE

## 2017-03-14 LAB — HEPATITIS B SURFACE ANTIGEN: Hepatitis B Surface Ag: NONREACTIVE

## 2017-03-14 LAB — RPR

## 2017-03-16 ENCOUNTER — Encounter (HOSPITAL_COMMUNITY): Payer: Medicare Other

## 2017-03-16 ENCOUNTER — Encounter (HOSPITAL_COMMUNITY)
Admission: RE | Admit: 2017-03-16 | Discharge: 2017-03-16 | Disposition: A | Discharge status: Home / Self Care | Payer: Medicare Other | Source: Ambulatory Visit | Attending: Internal Medicine | Admitting: Internal Medicine

## 2017-03-16 DIAGNOSIS — K219 Gastro-esophageal reflux disease without esophagitis: Secondary | ICD-10-CM | POA: Diagnosis not present

## 2017-03-16 DIAGNOSIS — J45909 Unspecified asthma, uncomplicated: Secondary | ICD-10-CM | POA: Diagnosis not present

## 2017-03-16 DIAGNOSIS — F419 Anxiety disorder, unspecified: Secondary | ICD-10-CM | POA: Diagnosis not present

## 2017-03-16 DIAGNOSIS — I5022 Chronic systolic (congestive) heart failure: Secondary | ICD-10-CM

## 2017-03-16 DIAGNOSIS — I110 Hypertensive heart disease with heart failure: Secondary | ICD-10-CM | POA: Diagnosis not present

## 2017-03-16 DIAGNOSIS — Z95810 Presence of automatic (implantable) cardiac defibrillator: Secondary | ICD-10-CM | POA: Diagnosis not present

## 2017-03-16 NOTE — Progress Notes (Signed)
Internal Medicine Clinic Attending  Case discussed with Dr. Rathoreat the time of the visit. We reviewed the resident's history and exam and pertinent patient test results. I agree with the assessment, diagnosis, and plan of care documented in the resident's note.  

## 2017-03-19 ENCOUNTER — Encounter (HOSPITAL_COMMUNITY)
Admission: RE | Admit: 2017-03-19 | Discharge: 2017-03-19 | Disposition: A | Discharge status: Home / Self Care | Payer: Medicare Other | Source: Ambulatory Visit | Attending: Internal Medicine | Admitting: Internal Medicine

## 2017-03-19 ENCOUNTER — Encounter (HOSPITAL_COMMUNITY): Payer: Medicare Other

## 2017-03-19 ENCOUNTER — Encounter: Payer: Self-pay | Admitting: Internal Medicine

## 2017-03-19 DIAGNOSIS — I5022 Chronic systolic (congestive) heart failure: Secondary | ICD-10-CM | POA: Diagnosis not present

## 2017-03-19 DIAGNOSIS — J45909 Unspecified asthma, uncomplicated: Secondary | ICD-10-CM | POA: Diagnosis not present

## 2017-03-19 DIAGNOSIS — I110 Hypertensive heart disease with heart failure: Secondary | ICD-10-CM | POA: Diagnosis not present

## 2017-03-19 DIAGNOSIS — Z95810 Presence of automatic (implantable) cardiac defibrillator: Secondary | ICD-10-CM | POA: Diagnosis not present

## 2017-03-19 DIAGNOSIS — K219 Gastro-esophageal reflux disease without esophagitis: Secondary | ICD-10-CM | POA: Diagnosis not present

## 2017-03-19 DIAGNOSIS — F419 Anxiety disorder, unspecified: Secondary | ICD-10-CM | POA: Diagnosis not present

## 2017-03-20 ENCOUNTER — Encounter: Payer: Self-pay | Admitting: Internal Medicine

## 2017-03-21 ENCOUNTER — Encounter (HOSPITAL_COMMUNITY)
Admission: RE | Admit: 2017-03-21 | Discharge: 2017-03-21 | Disposition: A | Discharge status: Home / Self Care | Payer: Medicare Other | Source: Ambulatory Visit | Attending: Internal Medicine | Admitting: Internal Medicine

## 2017-03-21 ENCOUNTER — Encounter (HOSPITAL_COMMUNITY): Payer: Medicare Other

## 2017-03-21 DIAGNOSIS — K219 Gastro-esophageal reflux disease without esophagitis: Secondary | ICD-10-CM | POA: Diagnosis not present

## 2017-03-21 DIAGNOSIS — F419 Anxiety disorder, unspecified: Secondary | ICD-10-CM | POA: Diagnosis not present

## 2017-03-21 DIAGNOSIS — J45909 Unspecified asthma, uncomplicated: Secondary | ICD-10-CM | POA: Diagnosis not present

## 2017-03-21 DIAGNOSIS — I5022 Chronic systolic (congestive) heart failure: Secondary | ICD-10-CM | POA: Diagnosis not present

## 2017-03-21 DIAGNOSIS — Z95810 Presence of automatic (implantable) cardiac defibrillator: Secondary | ICD-10-CM | POA: Diagnosis not present

## 2017-03-21 DIAGNOSIS — I110 Hypertensive heart disease with heart failure: Secondary | ICD-10-CM | POA: Diagnosis not present

## 2017-03-23 ENCOUNTER — Encounter (HOSPITAL_COMMUNITY): Payer: Medicare Other

## 2017-03-23 ENCOUNTER — Encounter (HOSPITAL_COMMUNITY)
Admission: RE | Admit: 2017-03-23 | Discharge: 2017-03-23 | Disposition: A | Discharge status: Home / Self Care | Payer: Medicare Other | Source: Ambulatory Visit | Attending: Internal Medicine | Admitting: Internal Medicine

## 2017-03-23 ENCOUNTER — Encounter: Payer: BLUE CROSS/BLUE SHIELD | Admitting: Internal Medicine

## 2017-03-23 DIAGNOSIS — I110 Hypertensive heart disease with heart failure: Secondary | ICD-10-CM | POA: Diagnosis not present

## 2017-03-23 DIAGNOSIS — K219 Gastro-esophageal reflux disease without esophagitis: Secondary | ICD-10-CM | POA: Diagnosis not present

## 2017-03-23 DIAGNOSIS — F419 Anxiety disorder, unspecified: Secondary | ICD-10-CM | POA: Diagnosis not present

## 2017-03-23 DIAGNOSIS — J45909 Unspecified asthma, uncomplicated: Secondary | ICD-10-CM | POA: Diagnosis not present

## 2017-03-23 DIAGNOSIS — Z95810 Presence of automatic (implantable) cardiac defibrillator: Secondary | ICD-10-CM | POA: Diagnosis not present

## 2017-03-23 DIAGNOSIS — I5022 Chronic systolic (congestive) heart failure: Secondary | ICD-10-CM | POA: Diagnosis not present

## 2017-03-23 NOTE — Progress Notes (Signed)
Susan Peck 51 y.o. female      Nutrition Note Nutrition Diagnosis ? Food-and nutrition-related knowledge deficit related to lack of exposure to information as related to diagnosis of: ? CVD ? DM ? Obesity related to excessive energy intake as evidenced by a BMI of 32.8  Nutrition Goal(s):  ? Improved glycemic control as evidenced by pt's A1c trending from 7.3 to less than 7 ? Pt to identify food quantities necessary to achieve weight loss of 6-24 lb (2.7-10.9 kg) at graduation from cardiac rehab. Goal wt of 165-175 lb desired.   Nutrition Note Spoke with pt. Pt feels she is doing well with her diet. Pt trying to follow 1200-1500 kcal menu ideas and states "I'm still working on getting everything straight." Pt interested in DM snack ideas, which were discussed. Pt expressed understanding of the information reviewed. Pt aware of nutrition education classes offered and plans on attending nutrition classes.  Nutrition Intervention ? Pt's individual nutrition plan reviewed with pt. ? Pt given handouts for: ? Diabetic Snack ideas  Plan:  Pt to attend nutrition classes ? Nutrition I ? Nutrition II ? Diabetes Blitz   ? Portion Distortion - met 03/14/17  ? Diabetes Q & A - met 02/23/17 Will provide client-centered nutrition education as part of interdisciplinary care.   Monitor and evaluate progress toward nutrition goal with team.  Derek Mound, M.Ed, RD, LDN, CDE 03/23/2017 9:58 AM

## 2017-03-27 NOTE — Addendum Note (Signed)
Encounter addended by: Dorna Bloom D on: 03/27/2017  2:53 PM<BR>    Actions taken: Flowsheet accepted

## 2017-03-28 ENCOUNTER — Ambulatory Visit (INDEPENDENT_AMBULATORY_CARE_PROVIDER_SITE_OTHER): Payer: Medicare Other | Admitting: Internal Medicine

## 2017-03-28 ENCOUNTER — Telehealth: Payer: Self-pay | Admitting: Cardiology

## 2017-03-28 ENCOUNTER — Encounter (HOSPITAL_COMMUNITY)
Admission: RE | Admit: 2017-03-28 | Discharge: 2017-03-28 | Disposition: A | Discharge status: Home / Self Care | Payer: Medicare Other | Source: Ambulatory Visit | Attending: Internal Medicine | Admitting: Internal Medicine

## 2017-03-28 ENCOUNTER — Encounter: Payer: Self-pay | Admitting: *Deleted

## 2017-03-28 ENCOUNTER — Encounter (HOSPITAL_COMMUNITY): Payer: Medicare Other

## 2017-03-28 ENCOUNTER — Encounter: Payer: Self-pay | Admitting: Internal Medicine

## 2017-03-28 VITALS — BP 142/82 | HR 65 | Ht 66.0 in | Wt 187.0 lb

## 2017-03-28 DIAGNOSIS — J302 Other seasonal allergic rhinitis: Secondary | ICD-10-CM | POA: Diagnosis not present

## 2017-03-28 DIAGNOSIS — I5022 Chronic systolic (congestive) heart failure: Secondary | ICD-10-CM

## 2017-03-28 DIAGNOSIS — F419 Anxiety disorder, unspecified: Secondary | ICD-10-CM | POA: Insufficient documentation

## 2017-03-28 DIAGNOSIS — K589 Irritable bowel syndrome without diarrhea: Secondary | ICD-10-CM | POA: Diagnosis not present

## 2017-03-28 DIAGNOSIS — E669 Obesity, unspecified: Secondary | ICD-10-CM | POA: Diagnosis not present

## 2017-03-28 DIAGNOSIS — I509 Heart failure, unspecified: Secondary | ICD-10-CM | POA: Diagnosis present

## 2017-03-28 DIAGNOSIS — G4730 Sleep apnea, unspecified: Secondary | ICD-10-CM | POA: Diagnosis not present

## 2017-03-28 DIAGNOSIS — K219 Gastro-esophageal reflux disease without esophagitis: Secondary | ICD-10-CM | POA: Diagnosis not present

## 2017-03-28 DIAGNOSIS — F3289 Other specified depressive episodes: Secondary | ICD-10-CM | POA: Diagnosis not present

## 2017-03-28 DIAGNOSIS — J45909 Unspecified asthma, uncomplicated: Secondary | ICD-10-CM | POA: Diagnosis not present

## 2017-03-28 DIAGNOSIS — I110 Hypertensive heart disease with heart failure: Secondary | ICD-10-CM | POA: Diagnosis not present

## 2017-03-28 DIAGNOSIS — Z95810 Presence of automatic (implantable) cardiac defibrillator: Secondary | ICD-10-CM | POA: Insufficient documentation

## 2017-03-28 LAB — CUP PACEART INCLINIC DEVICE CHECK
Implantable Lead Implant Date: 20180524
Implantable Lead Implant Date: 20180524
Implantable Lead Location: 753858
Implantable Lead Model: 5076
MDC IDC LEAD IMPLANT DT: 20180524
MDC IDC LEAD LOCATION: 753859
MDC IDC LEAD LOCATION: 753860
MDC IDC PG IMPLANT DT: 20180524
MDC IDC SESS DTM: 20180905134807

## 2017-03-28 NOTE — Progress Notes (Signed)
Patient Care Team: Einar Gip, DO as PCP - General (Internal Medicine)   HPI  Susan Peck is a 51 y.o. female  Seen in followup for ICD implanted 65/18 for primary prevention for NICM   DATE TEST    9/12 Echo EF 25-30%   2/17    Cath   EF 35 % Normal CA  2/17    Echo   EF 30-35 %   3/*18 Echo EF 20-25%    She continues to complain of shortness of breath. She thinks her breathing got better to begin with and now she has problems with congestion. She has some edema   No chest pain   Min discomfort at ICD site; now largely resolved   Date Cr K Mg  8/18  0.57 4.3            Past Medical History:  Diagnosis Date  . AICD (automatic cardioverter/defibrillator) present 12/13/2016  . Anxiety    no meds  . Asthma   . CHF (congestive heart failure) ([place])   . Depression    no meds  . Diabetes mellitus    recent dx 11/17/11 - started med 11/18/11 type 2  . GERD (gastroesophageal reflux disease)   . Goiter 07/27/2011   non-neoplastic goiter - fine needle aspiration - benign sees dr Dwyane Dee for  . Heart murmur    dx 2 yrs ago per pt  . Herpes genitalis in women   . Hypertension   . IBS (irritable bowel syndrome)    tx with diet per pt  . Low back pain    history  . Obesity   . Seasonal allergies   . Shortness of breath    occasional - exercise induced  . Sleep apnea    cpap broken  . Systolic heart failure    May 2011 EF 35-40%, 04/03/11 EF 25-30%, 06/27/11 EF 30-35%    Past Surgical History:  Procedure Laterality Date  . ABDOMINAL HYSTERECTOMY    . BIV ICD INSERTION CRT-D N/A 12/13/2016   Procedure: BiV ICD Insertion CRT-D;  Surgeon: Deboraha Sprang, MD;  Location: [facility];  Service: Cardiovascular;  Laterality: N/A;  . cardiac catherization  2004   [place], [place] - Dr Lynnell Jude  . CARDIAC CATHETERIZATION    . CARDIAC CATHETERIZATION N/A 09/15/2015   Procedure: Right/Left Heart Cath and Coronary Angiography;  Surgeon: Jolaine Artist, MD;   Location: [facility];  Service: Cardiovascular;  Laterality: N/A;  . COLONOSCOPY WITH PROPOFOL N/A 03/26/2015   Procedure: COLONOSCOPY WITH PROPOFOL;  Surgeon: Carol Ada, MD;  Location: [facility];  Service: Endoscopy;  Laterality: N/A;  . colonscopy    . CYSTOSCOPY  11/23/2011   Procedure: CYSTOSCOPY;  Surgeon: Jolayne Haines, MD;  Location: [facility];  Service: Gynecology;  Laterality: N/A;  . DIAGNOSTIC LAPAROSCOPY     of pelvis  . DILATION AND CURETTAGE OF UTERUS  12/2006,  10/2005   hysteroscopy surgery x 2  . INSERTION OF ICD  12/13/2016   BIV  . LAPAROSCOPIC ASSISTED VAGINAL HYSTERECTOMY  11/23/2011   Procedure: LAPAROSCOPIC ASSISTED VAGINAL HYSTERECTOMY;  Surgeon: Jolayne Haines, MD;  Location: [facility];  Service: Gynecology;  Laterality: N/A;  . NOVASURE ABLATION     10/2005  . SALPINGOOPHORECTOMY  11/23/2011   Procedure: SALPINGO OOPHERECTOMY;  Surgeon: Jolayne Haines, MD;  Location: [facility];  Service: Gynecology;  Laterality: Bilateral;  . SVD     x 3  .  TUBAL LIGATION    . WISDOM TOOTH EXTRACTION      Current Outpatient Prescriptions  Medication Sig Dispense Refill  . albuterol (PROVENTIL HFA;VENTOLIN HFA) 108 (90 Base) MCG/ACT inhaler Inhale 1-2 puffs into the lungs every 6 (six) hours as needed for wheezing or shortness of breath. 1 Inhaler 0  . aspirin 81 MG chewable tablet Chew 81 mg by mouth daily.     . Aspirin-Acetaminophen-Caffeine (GOODY HEADACHE PO) Take 1 packet by mouth daily as needed (pain/headache).    Marland Kitchen atorvastatin (LIPITOR) 40 MG tablet Take 40 mg by mouth daily.    Marland Kitchen BAYER CONTOUR TEST test strip   1  . carvedilol (COREG) 25 MG tablet Take 1 tablet (25 mg total) by mouth 2 (two) times daily with a meal. 60 tablet 11  . fluticasone (FLONASE) 50 MCG/ACT nasal spray Place 1 spray into both nostrils 2 (two) times daily as needed for allergies or rhinitis. 16 g 3  . furosemide (LASIX) 40 MG tablet TAKE 1 TABLET (40 MG TOTAL) BY MOUTH 2 (TWO) TIMES DAILY. 60  tablet 3  . glimepiride (AMARYL) 2 MG tablet Take 2 mg by mouth daily.     Marland Kitchen levothyroxine (SYNTHROID, LEVOTHROID) 50 MCG tablet Take 1 tablet (50 mcg total) by mouth daily. 30 tablet 4  . loratadine (CLARITIN) 10 MG tablet Take 1 tablet (10 mg total) by mouth daily. 30 tablet 2  . pantoprazole (PROTONIX) 40 MG tablet Take 1 tablet (40 mg total) by mouth daily. 90 tablet 0  . PRESCRIPTION MEDICATION Inhale into the lungs at bedtime. CPAP    . ranitidine (ZANTAC) 150 MG tablet Take 1 tablet (150 mg total) by mouth 2 (two) times daily. (Patient taking differently: Take 150 mg by mouth daily as needed for heartburn. ) 180 tablet 3  . sacubitril-valsartan (ENTRESTO) 97-103 MG Take 1 tablet by mouth 2 (two) times daily. 60 tablet 11  . sodium chloride (OCEAN) 0.65 % SOLN nasal spray Place 2 sprays into both nostrils daily as needed for congestion.     No current facility-administered medications for this visit.    Facility-Administered Medications Ordered in Other Visits  Medication Dose Route Frequency Provider Last Rate Last Dose  . aminophylline injection 150 mg  150 mg Intravenous BID PRN Larey Dresser, MD   150 mg at 12/24/14 1005    Allergies  Allergen Reactions  . Shrimp [Shellfish Allergy] Anaphylaxis      Review of Systems negative except from HPI and PMH  Physical Exam BP (!) 142/82   Pulse 65   Ht 5\' 6"  (1.676 m)   Wt 187 lb (84.8 kg)   BMI 30.18 kg/m  Well developed and well nourished in no acute distress HENT normal E scleral and icterus clear Neck Supple JVP flat; carotids brisk and full Clear to ausculation Device pocket well healed; without hematoma or erythema.  There is no tethering  Regular rate and rhythm, no murmurs gallops or rub Soft with active bowel sounds No clubbing cyanosis  Edema Alert and oriented, grossly normal motor and sensory function Skin Warm and Dry  ECG demonstrated PVCs synchronous pacing with predominantly negative QRS in lead V1  and positive QRS in lead 1. We changed his echo from 2-3---1-2 with an improvement in the ECG pattern. We will arrange for AV optimization echo and arrange for follow-up with Dr. Ronna Polio in 3 months.  Assessment and  Plan  NICM  CHF chronic systolic  CRT- D [employer]  With her ongoing problems with heart failure, we will undertake an AV optimization echo. ECG optimization was able to afford some improvement.  She is not volume overloaded at this point.  We will arrange follow-up with heart failure clinic.      Current medicines are reviewed at length with the patient today .  The patient does not  have concerns regarding medicines.

## 2017-03-28 NOTE — Telephone Encounter (Signed)
Patient called and stated that she is still feeling the thumping after having settings changed at today's appt. Please call back to discuss.

## 2017-03-28 NOTE — Telephone Encounter (Signed)
Having some infrequent diaphragmatic stimulation. She will call back if over the next couple of days it becomes more bothersome.

## 2017-03-28 NOTE — Patient Instructions (Addendum)
Medication Instructions:  Your physician recommends that you continue on your current medications as directed. Please refer to the Current Medication list given to you today.   Labwork: None ordered  Testing/Procedures: Your physician has requested that you have an AV Optimization echocardiogram. Echocardiography is a painless test that uses sound waves to create images of your heart. It provides your doctor with information about the size and shape of your heart and how well your heart's chambers and valves are working. This procedure takes approximately one hour. There are no restrictions for this procedure.  Follow-Up: Remote monitoring is used to monitor your ICD from home. This monitoring reduces the number of office visits required to check your device to one time per year. It allows [place] to keep an eye on the functioning of your device to ensure it is working properly. You are scheduled for a device check from home on 06/27/17. You may send your transmission at any time that day. If you have a wireless device, the transmission will be sent automatically. After your physician reviews your transmission, you will receive a postcard with your next transmission date.  Your physician recommends that you schedule a follow-up appointment in: 3 months with Dr. Haroldine Laws.   Your physician wants you to follow-up in: 9 months with Dr. Caryl Comes. You will receive a reminder letter in the mail two months in advance. If you don't receive a letter, please call our office to schedule the follow-up appointment.   Any Other Special Instructions Will Be Listed Below (If Applicable).     If you need a refill on your cardiac medications before your next appointment, please call your pharmacy.

## 2017-03-29 ENCOUNTER — Ambulatory Visit (INDEPENDENT_AMBULATORY_CARE_PROVIDER_SITE_OTHER): Payer: Self-pay | Admitting: *Deleted

## 2017-03-29 ENCOUNTER — Encounter: Payer: Self-pay | Admitting: Internal Medicine

## 2017-03-29 ENCOUNTER — Telehealth: Payer: Self-pay | Admitting: Cardiology

## 2017-03-29 DIAGNOSIS — I428 Other cardiomyopathies: Secondary | ICD-10-CM

## 2017-03-29 DIAGNOSIS — I5022 Chronic systolic (congestive) heart failure: Secondary | ICD-10-CM

## 2017-03-29 DIAGNOSIS — I447 Left bundle-branch block, unspecified: Secondary | ICD-10-CM

## 2017-03-29 LAB — CUP PACEART INCLINIC DEVICE CHECK
Battery Remaining Longevity: 98 mo
Brady Statistic AP VP Percent: 0.5 %
Brady Statistic AS VP Percent: 99.45 %
Brady Statistic AS VS Percent: 0.04 %
Brady Statistic RV Percent Paced: 99.91 %
Date Time Interrogation Session: 20180906111213
HighPow Impedance: 73 Ohm
Implantable Lead Implant Date: 20180524
Implantable Lead Location: 753859
Implantable Lead Location: 753860
Implantable Lead Model: 5076
Implantable Pulse Generator Implant Date: 20180524
Lead Channel Impedance Value: 170.472
Lead Channel Impedance Value: 178.5 Ohm
Lead Channel Impedance Value: 178.5 Ohm
Lead Channel Impedance Value: 189.525
Lead Channel Impedance Value: 323 Ohm
Lead Channel Impedance Value: 361 Ohm
Lead Channel Impedance Value: 399 Ohm
Lead Channel Impedance Value: 399 Ohm
Lead Channel Impedance Value: 418 Ohm
Lead Channel Impedance Value: 475 Ohm
Lead Channel Impedance Value: 570 Ohm
Lead Channel Impedance Value: 608 Ohm
Lead Channel Impedance Value: 665 Ohm
Lead Channel Pacing Threshold Amplitude: 0.5 V
Lead Channel Pacing Threshold Amplitude: 0.5 V
Lead Channel Pacing Threshold Pulse Width: 0.4 ms
Lead Channel Pacing Threshold Pulse Width: 0.4 ms
Lead Channel Sensing Intrinsic Amplitude: 21.625 mV
Lead Channel Sensing Intrinsic Amplitude: 21.625 mV
Lead Channel Sensing Intrinsic Amplitude: 4.5 mV
Lead Channel Setting Pacing Amplitude: 2.5 V
Lead Channel Setting Pacing Pulse Width: 0.4 ms
Lead Channel Setting Sensing Sensitivity: 0.3 mV
MDC IDC LEAD IMPLANT DT: 20180524
MDC IDC LEAD IMPLANT DT: 20180524
MDC IDC LEAD LOCATION: 753858
MDC IDC MSMT BATTERY VOLTAGE: 3.01 V
MDC IDC MSMT LEADCHNL LV IMPEDANCE VALUE: 199.5 Ohm
MDC IDC MSMT LEADCHNL LV IMPEDANCE VALUE: 456 Ohm
MDC IDC MSMT LEADCHNL LV IMPEDANCE VALUE: 627 Ohm
MDC IDC MSMT LEADCHNL LV IMPEDANCE VALUE: 665 Ohm
MDC IDC MSMT LEADCHNL LV PACING THRESHOLD PULSEWIDTH: 1 ms
MDC IDC MSMT LEADCHNL RA PACING THRESHOLD AMPLITUDE: 0.5 V
MDC IDC MSMT LEADCHNL RA SENSING INTR AMPL: 4.5 mV
MDC IDC MSMT LEADCHNL RV IMPEDANCE VALUE: 608 Ohm
MDC IDC SET LEADCHNL LV PACING AMPLITUDE: 1.5 V
MDC IDC SET LEADCHNL RA PACING AMPLITUDE: 1.5 V
MDC IDC SET LEADCHNL RV PACING PULSEWIDTH: 0.4 ms
MDC IDC STAT BRADY AP VS PERCENT: 0.01 %
MDC IDC STAT BRADY RA PERCENT PACED: 0.51 %

## 2017-03-29 NOTE — Progress Notes (Signed)
Changed LV programming back to LV2-3 d/t diaphragmatic stim. Pt reports she is unable to sleep, denies pain. Threshold 0.5v @ 25ms, 0.75v @ 0.81ms. LV capture management on "auto" and PW changed to 0.81ms.

## 2017-03-29 NOTE — Telephone Encounter (Signed)
Patient called this morning and stated that she is still having the thumping and it is really bothering her, she wants to come in to have it fixed. Pt agreed to an appt to day at 11:00 AM.

## 2017-03-30 ENCOUNTER — Encounter (HOSPITAL_COMMUNITY): Payer: Medicare Other

## 2017-03-30 ENCOUNTER — Encounter (HOSPITAL_COMMUNITY)
Admission: RE | Admit: 2017-03-30 | Discharge: 2017-03-30 | Disposition: A | Discharge status: Home / Self Care | Payer: Medicare Other | Source: Ambulatory Visit | Attending: Internal Medicine | Admitting: Internal Medicine

## 2017-03-30 DIAGNOSIS — I5022 Chronic systolic (congestive) heart failure: Secondary | ICD-10-CM

## 2017-03-30 DIAGNOSIS — K219 Gastro-esophageal reflux disease without esophagitis: Secondary | ICD-10-CM | POA: Diagnosis not present

## 2017-03-30 DIAGNOSIS — Z95810 Presence of automatic (implantable) cardiac defibrillator: Secondary | ICD-10-CM | POA: Diagnosis not present

## 2017-03-30 DIAGNOSIS — I110 Hypertensive heart disease with heart failure: Secondary | ICD-10-CM | POA: Diagnosis not present

## 2017-03-30 DIAGNOSIS — J45909 Unspecified asthma, uncomplicated: Secondary | ICD-10-CM | POA: Diagnosis not present

## 2017-03-30 DIAGNOSIS — F419 Anxiety disorder, unspecified: Secondary | ICD-10-CM | POA: Diagnosis not present

## 2017-04-02 ENCOUNTER — Encounter (HOSPITAL_COMMUNITY): Payer: Medicare Other

## 2017-04-03 ENCOUNTER — Ambulatory Visit (INDEPENDENT_AMBULATORY_CARE_PROVIDER_SITE_OTHER): Payer: Medicaid Other | Admitting: Internal Medicine

## 2017-04-03 ENCOUNTER — Encounter: Payer: Self-pay | Admitting: Internal Medicine

## 2017-04-03 VITALS — BP 128/69 | HR 67 | Temp 98.0°F | Ht 66.0 in | Wt 197.6 lb

## 2017-04-03 DIAGNOSIS — E1165 Type 2 diabetes mellitus with hyperglycemia: Secondary | ICD-10-CM | POA: Diagnosis not present

## 2017-04-03 DIAGNOSIS — M545 Low back pain, unspecified: Secondary | ICD-10-CM

## 2017-04-03 DIAGNOSIS — Z833 Family history of diabetes mellitus: Secondary | ICD-10-CM

## 2017-04-03 DIAGNOSIS — E1169 Type 2 diabetes mellitus with other specified complication: Principal | ICD-10-CM

## 2017-04-03 DIAGNOSIS — G8929 Other chronic pain: Secondary | ICD-10-CM | POA: Insufficient documentation

## 2017-04-03 DIAGNOSIS — IMO0002 Reserved for concepts with insufficient information to code with codable children: Secondary | ICD-10-CM

## 2017-04-03 LAB — GLUCOSE, CAPILLARY: GLUCOSE-CAPILLARY: 294 mg/dL — AB (ref 65–99)

## 2017-04-03 LAB — POCT GLYCOSYLATED HEMOGLOBIN (HGB A1C): Hemoglobin A1C: 8.7

## 2017-04-03 NOTE — Pre-Procedure Instructions (Signed)
Susan Peck  04/03/2017      [id number] [address], [place] [address] Phone: [phone] Fax: [phone]    Your procedure is scheduled on September 14  Report to [facility] at [facility] A.M.  Call this number if you have problems the morning of surgery:  [phone]   Remember:  Do not eat food or drink liquids after midnight.  Continue all other medications as directed by your physician except follow these medication instructions before surgery   Take these medicines the morning of surgery with A SIP OF WATER  albuterol (PROVENTIL HFA;VENTOLIN HFA)  carvedilol (COREG)  estradiol (ESTRACE) fluticasone (FLONASE)  levothyroxine (SYNTHROID, LEVOTHROID) loratadine (CLARITIN pantoprazole (PROTONIX)   7 days prior to surgery STOP taking any Aleve, Naproxen, Ibuprofen, Motrin, Advil, Goody's, BC's, all herbal medications, fish oil, and all vitamins  Follow your doctors instructions regarding your Aspirin.  If no instructions were given by the doctor you will need to call the office to get instructions.  Your pre admission RN will also call for those instructions  WHAT DO I DO ABOUT MY DIABETES MEDICATION?   Marland Kitchen Do not take oral diabetes medicines (pills) the morning of surgery. glimepiride (AMARYL)   How to Manage Your Diabetes Before and After Surgery  Why is it important to control my blood sugar before and after surgery? . Improving blood sugar levels before and after surgery helps healing and can limit problems. . A way of improving blood sugar control is eating a healthy diet by: o  Eating less sugar and carbohydrates o  Increasing activity/exercise o  Talking with your doctor about reaching your blood sugar goals . High blood sugars (greater than 180 mg/dL) can raise your risk of infections and slow your recovery, so you will need to focus on controlling your diabetes during  the weeks before surgery. . Make sure that the doctor who takes care of your diabetes knows about your planned surgery including the date and location.  How do I manage my blood sugar before surgery? . Check your blood sugar at least 4 times a day, starting 2 days before surgery, to make sure that the level is not too high or low. o Check your blood sugar the morning of your surgery when you wake up and every 2 hours until you get to the Short Stay unit. . If your blood sugar is less than 70 mg/dL, you will need to treat for low blood sugar: o Do not take insulin. o Treat a low blood sugar (less than 70 mg/dL) with  cup of clear juice (cranberry or apple), 4 glucose tablets, OR glucose gel. o Recheck blood sugar in 15 minutes after treatment (to make sure it is greater than 70 mg/dL). If your blood sugar is not greater than 70 mg/dL on recheck, call [phone] for further instructions. . Report your blood sugar to the short stay nurse when you get to Short Stay.  . If you are admitted to the hospital after surgery: o Your blood sugar will be checked by the staff and you will probably be given insulin after surgery (instead of oral diabetes medicines) to make sure you have good blood sugar levels. o The goal for blood sugar control after surgery is 80-180 mg/dL.     Do not wear jewelry, make-up or nail polish.  Do not wear lotions, powders, or perfumes, or deoderant.  Do not shave  48 hours prior to surgery.   Do not bring valuables to the hospital.  [facility] is not responsible for any belongings or valuables.  Contacts, dentures or bridgework may not be worn into surgery.  Leave your suitcase in the car.  After surgery it may be brought to your room.  For patients admitted to the hospital, discharge time will be determined by your treatment team.  Patients discharged the day of surgery will not be allowed to drive home.    Special instructions:   [facility]- Preparing For  Surgery  Before surgery, you can play an important role. Because skin is not sterile, your skin needs to be as free of germs as possible. You can reduce the number of germs on your skin by washing with CHG (chlorahexidine gluconate) Soap before surgery.  CHG is an antiseptic cleaner which kills germs and bonds with the skin to continue killing germs even after washing.  Please do not use if you have an allergy to CHG or antibacterial soaps. If your skin becomes reddened/irritated stop using the CHG.  Do not shave (including legs and underarms) for at least 48 hours prior to first CHG shower. It is OK to shave your face.  Please follow these instructions carefully.   1. Shower the NIGHT BEFORE SURGERY and the MORNING OF SURGERY with CHG.   2. If you chose to wash your hair, wash your hair first as usual with your normal shampoo.  3. After you shampoo, rinse your hair and body thoroughly to remove the shampoo.  4. Use CHG as you would any other liquid soap. You can apply CHG directly to the skin and wash gently with a scrungie or a clean washcloth.   5. Apply the CHG Soap to your body ONLY FROM THE NECK DOWN.  Do not use on open wounds or open sores. Avoid contact with your eyes, ears, mouth and genitals (private parts). Wash genitals (private parts) with your normal soap.  6. Wash thoroughly, paying special attention to the area where your surgery will be performed.  7. Thoroughly rinse your body with warm water from the neck down.  8. DO NOT shower/wash with your normal soap after using and rinsing off the CHG Soap.  9. Pat yourself dry with a CLEAN TOWEL.   10. Wear CLEAN PAJAMAS   11. Place CLEAN SHEETS on your bed the night of your first shower and DO NOT SLEEP WITH PETS.    Day of Surgery: Do not apply any deodorants/lotions. Please wear clean clothes to the hospital/surgery center.      Please read over the following fact sheets that you were given.

## 2017-04-03 NOTE — Progress Notes (Signed)
   CC: Discuss back pain, mattress support  HPI:  [name] is a 51 y.o. F here to discuss leg pain after getting a new mattress. States since she got a new mattress several weeks ago that she has been waking up with back pain. Wants to know if we can prescribe a brace or support or a support system for her bed at home to help with this. No numbness, tingling, weakness.  Past Medical History:  Diagnosis Date  . AICD (automatic cardioverter/defibrillator) present 12/13/2016  . Anxiety    no meds  . Asthma   . CHF (congestive heart failure) ([place])   . Depression    no meds  . Diabetes mellitus    recent dx 11/17/11 - started med 11/18/11 type 2  . GERD (gastroesophageal reflux disease)   . Goiter 07/27/2011   non-neoplastic goiter - fine needle aspiration - benign sees dr Dwyane Dee for  . Heart murmur    dx 2 yrs ago per pt  . Herpes genitalis in women   . Hypertension   . IBS (irritable bowel syndrome)    tx with diet per pt  . Low back pain    history  . Obesity   . Seasonal allergies   . Shortness of breath    occasional - exercise induced  . Sleep apnea    cpap broken  . Systolic heart failure    May 2011 EF 35-40%, 04/03/11 EF 25-30%, 06/27/11 EF 30-35%   Review of Systems:   General: Denies fevers, chills, weight loss, fatigue HEENT: Denies changes in vision, sore throat, dysphagia Cardiac: Denies CP, SOB, palpitations Pulmonary: Denies cough, wheezes, PND Abd: Denies diarrhea, constipation, changes in bowels Extremities: Denies weakness or swelling  Physical Exam: General: Alert, in no acute distress. Pleasant and conversant HEENT: No icterus, injection or ptosis. No hoarseness or dysarthria  Cardiac: RRR, no MGR appreciated Pulmonary: CTA BL with normal WOB on RA. Able to speak in complete sentences Abd: Soft, non-tended. +bs Extremities: Warm, perfused. No significant pedal edema.  Back: Spasms of R paraspinal musculature in lumbar region. No point  tenderness.  Vitals:   04/03/17 1358  BP: 128/69  Pulse: 67  Temp: 98 F (36.7 C)  TempSrc: Oral  SpO2: 99%  Weight: 197 lb 9.6 oz (89.6 kg)  Height: 5\' 6"  (1.676 m)   Assessment & Plan:   See Encounters Tab for problem based charting.  Patient discussed with Dr. Eppie Gibson

## 2017-04-03 NOTE — Patient Instructions (Signed)
It was a pleasure seeing you today. I'm sorry you are having issues with your back!   I talked with our staff, Medicaid will not cover a board for under your bed for extra support. As recommended, please get plywood or other firm support from a local home improvement store and place those under your mattress. This will help provide the support you need. In addition, you can take Tylenol, Ibuprofen OR Naproxen as needed. Heat will also be helpful.   Please come back and see me as needed.   Please keep your follow-up appointment with your Endochrinologist, eye doctor and foot doctors!

## 2017-04-03 NOTE — Assessment & Plan Note (Signed)
Back pain following changing mattresses. States new mattress is significantly softer than old mattress and wonders if I can prescribe a support for her back or her bed. Discussed with patient the inappropriateness of a back brace at this time. Discussed options for adding support to her bed at home including adding lumbar boards under the mattress, getting new mattress, etc. She does have paraspinal spasms on examination however there were no red flag symptoms. She has seen a chiropractor in the past and states she will go again.  -Conservative measures with heat, NSAIDs, modifying bed

## 2017-04-03 NOTE — Telephone Encounter (Signed)
Informed patient that the message was from the documentation that was entered after she had her device checked in the office. Patient verbalized understanding.

## 2017-04-03 NOTE — Assessment & Plan Note (Signed)
HbA1c 8.7% today. Follows with Dr. Dwyane Dee of endocrinology. Compliant with Amaryl 2mg  daily. Has follow-up with this provider next week.

## 2017-04-04 ENCOUNTER — Encounter (HOSPITAL_COMMUNITY): Payer: Medicare Other

## 2017-04-04 ENCOUNTER — Encounter (HOSPITAL_COMMUNITY)
Admission: RE | Admit: 2017-04-04 | Discharge: 2017-04-04 | Disposition: A | Discharge status: Home / Self Care | Payer: Medicare Other | Source: Ambulatory Visit | Attending: Internal Medicine | Admitting: Internal Medicine

## 2017-04-04 ENCOUNTER — Encounter (HOSPITAL_COMMUNITY)
Admission: RE | Admit: 2017-04-04 | Discharge: 2017-04-04 | Disposition: A | Discharge status: Home / Self Care | Payer: Medicare Other | Source: Ambulatory Visit | Attending: Oral Surgery | Admitting: Oral Surgery

## 2017-04-04 DIAGNOSIS — J45909 Unspecified asthma, uncomplicated: Secondary | ICD-10-CM | POA: Diagnosis not present

## 2017-04-04 DIAGNOSIS — I110 Hypertensive heart disease with heart failure: Secondary | ICD-10-CM | POA: Diagnosis not present

## 2017-04-04 DIAGNOSIS — Z01812 Encounter for preprocedural laboratory examination: Secondary | ICD-10-CM | POA: Insufficient documentation

## 2017-04-04 DIAGNOSIS — I5022 Chronic systolic (congestive) heart failure: Secondary | ICD-10-CM | POA: Diagnosis not present

## 2017-04-04 DIAGNOSIS — K219 Gastro-esophageal reflux disease without esophagitis: Secondary | ICD-10-CM | POA: Diagnosis not present

## 2017-04-04 DIAGNOSIS — Z95810 Presence of automatic (implantable) cardiac defibrillator: Secondary | ICD-10-CM | POA: Diagnosis not present

## 2017-04-04 DIAGNOSIS — F419 Anxiety disorder, unspecified: Secondary | ICD-10-CM | POA: Diagnosis not present

## 2017-04-04 LAB — CBC
HCT: 37.9 % (ref 36.0–46.0)
HEMOGLOBIN: 13.3 g/dL (ref 12.0–15.0)
MCH: 28 pg (ref 26.0–34.0)
MCHC: 35.1 g/dL (ref 30.0–36.0)
MCV: 79.8 fL (ref 78.0–100.0)
Platelets: 205 10*3/uL (ref 150–400)
RBC: 4.75 MIL/uL (ref 3.87–5.11)
RDW: 12.4 % (ref 11.5–15.5)
WBC: 6.7 10*3/uL (ref 4.0–10.5)

## 2017-04-04 LAB — BASIC METABOLIC PANEL
ANION GAP: 7 (ref 5–15)
BUN: 9 mg/dL (ref 6–20)
CHLORIDE: 102 mmol/L (ref 101–111)
CO2: 26 mmol/L (ref 22–32)
Calcium: 9.2 mg/dL (ref 8.9–10.3)
Creatinine, Ser: 0.62 mg/dL (ref 0.44–1.00)
GFR calc non Af Amer: >60 mL/min (ref >60)
Glucose, Bld: 276 mg/dL — ABNORMAL HIGH (ref 65–99)
POTASSIUM: 3.8 mmol/L (ref 3.5–5.1)
Sodium: 135 mmol/L (ref 135–145)

## 2017-04-04 LAB — GLUCOSE, CAPILLARY: Glucose-Capillary: 243 mg/dL — ABNORMAL HIGH (ref 65–99)

## 2017-04-04 NOTE — Progress Notes (Signed)
Cardiac Individual Treatment Plan  Patient Details  Name: Susan Peck MRN: [id number] Date of Birth: 1966/04/23 Referring Provider:     CARDIAC REHAB PHASE II ORIENTATION from 01/23/2017 in [place]  Referring Provider  Glori Bickers MD      Initial Encounter Date:    CARDIAC REHAB PHASE II ORIENTATION from 01/23/2017 in [place]  Date  01/23/17  Referring Provider  Glori Bickers MD      Visit Diagnosis: Chronic systolic congestive heart failure ([place])  Patient's Home Medications on Admission:  Current Outpatient Prescriptions:  .  albuterol (PROVENTIL HFA;VENTOLIN HFA) 108 (90 Base) MCG/ACT inhaler, Inhale 1-2 puffs into the lungs every 6 (six) hours as needed for wheezing or shortness of breath., Disp: 1 Inhaler, Rfl: 0 .  aspirin 81 MG chewable tablet, Chew 81 mg by mouth daily. , Disp: , Rfl:  .  Aspirin-Acetaminophen-Caffeine (GOODY HEADACHE PO), Take 1 packet by mouth daily as needed (pain/headache)., Disp: , Rfl:  .  atorvastatin (LIPITOR) 40 MG tablet, Take 40 mg by mouth daily., Disp: , Rfl:  .  BAYER CONTOUR TEST test strip, , Disp: , Rfl: 1 .  carvedilol (COREG) 25 MG tablet, Take 1 tablet (25 mg total) by mouth 2 (two) times daily with a meal., Disp: 60 tablet, Rfl: 11 .  estradiol (ESTRACE) 1 MG tablet, Take 1 mg by mouth daily., Disp: , Rfl:  .  fluticasone (FLONASE) 50 MCG/ACT nasal spray, Place 1 spray into both nostrils 2 (two) times daily as needed for allergies or rhinitis. (Patient taking differently: Place 1 spray into both nostrils daily as needed for allergies or rhinitis. ), Disp: 16 g, Rfl: 3 .  furosemide (LASIX) 40 MG tablet, TAKE 1 TABLET (40 MG TOTAL) BY MOUTH 2 (TWO) TIMES DAILY., Disp: 60 tablet, Rfl: 3 .  glimepiride (AMARYL) 2 MG tablet, Take 2 mg by mouth daily with breakfast. , Disp: , Rfl:  .  ketoconazole (NIZORAL) 2 % cream, Apply 1 application topically daily., Disp: ,  Rfl: 2 .  levothyroxine (SYNTHROID, LEVOTHROID) 50 MCG tablet, Take 1 tablet (50 mcg total) by mouth daily., Disp: 30 tablet, Rfl: 4 .  loratadine (CLARITIN) 10 MG tablet, Take 1 tablet (10 mg total) by mouth daily. (Patient taking differently: Take 10 mg by mouth daily as needed for allergies. ), Disp: 30 tablet, Rfl: 2 .  pantoprazole (PROTONIX) 40 MG tablet, Take 1 tablet (40 mg total) by mouth daily., Disp: 90 tablet, Rfl: 0 .  PATADAY 0.2 % SOLN, Place 1 drop into both eyes daily., Disp: , Rfl: 4 .  PRESCRIPTION MEDICATION, Inhale into the lungs at bedtime. CPAP, Disp: , Rfl:  .  ranitidine (ZANTAC) 150 MG tablet, Take 1 tablet (150 mg total) by mouth 2 (two) times daily. (Patient not taking: Reported on 04/03/2017), Disp: 180 tablet, Rfl: 3 .  sacubitril-valsartan (ENTRESTO) 97-103 MG, Take 1 tablet by mouth 2 (two) times daily., Disp: 60 tablet, Rfl: 11 .  sodium chloride (OCEAN) 0.65 % SOLN nasal spray, Place 2 sprays into both nostrils daily as needed for congestion., Disp: , Rfl:  No current facility-administered medications for this encounter.   Facility-Administered Medications Ordered in Other Encounters:  .  aminophylline injection 150 mg, 150 mg, Intravenous, BID PRN, Larey Dresser, MD, 150 mg at 12/24/14 1005  Past Medical History: Past Medical History:  Diagnosis Date  . AICD (automatic cardioverter/defibrillator) present 12/13/2016  . Anxiety  no meds  . Asthma   . CHF (congestive heart failure) ([place])   . Depression    no meds  . Diabetes mellitus    recent dx 11/17/11 - started med 11/18/11 type 2  . GERD (gastroesophageal reflux disease)   . Goiter 07/27/2011   non-neoplastic goiter - fine needle aspiration - benign sees dr Dwyane Dee for  . Heart murmur    dx 2 yrs ago per pt  . Herpes genitalis in women   . Hypertension   . IBS (irritable bowel syndrome)    tx with diet per pt  . Low back pain    history  . Obesity   . Seasonal allergies   . Shortness of  breath    occasional - exercise induced  . Sleep apnea    cpap broken  . Systolic heart failure    May 2011 EF 35-40%, 04/03/11 EF 25-30%, 06/27/11 EF 30-35%    Tobacco Use: History  Smoking Status  . Never Smoker  Smokeless Tobacco  . Never Used    Labs: Recent Review Flowsheet Data    Labs for ITP Cardiac and Pulmonary Rehab Latest Ref Rng & Units 09/15/2015 11/09/2015 12/14/2015 04/10/2016 04/03/2017   Cholestrol 0 - 200 mg/dL - 192 - - -   LDLCALC 0 - 99 mg/dL - 115(H) - - -   HDL >39.00 mg/dL - 39.50 - - -   Trlycerides 0.0 - 149.0 mg/dL - 189.0(H) - - -   Hemoglobin A1c - - - 7.2(H) 7.3 8.7   HCO3 20.0 - 24.0 mEq/L 26.7(H) - - - -   TCO2 0 - 100 mmol/L 28 - - - -   O2SAT % 71.0 - - - -      Capillary Blood Glucose: Lab Results  Component Value Date   GLUCAP 294 (H) 04/03/2017   GLUCAP 269 (H) 03/14/2017   GLUCAP 222 (H) 03/12/2017   GLUCAP 195 (H) 02/26/2017   GLUCAP 251 (H) 02/12/2017     Exercise Target Goals:    Exercise Program Goal: Individual exercise prescription set with THRR, safety & activity barriers. Participant demonstrates ability to understand and report RPE using BORG scale, to self-measure pulse accurately, and to acknowledge the importance of the exercise prescription.  Exercise Prescription Goal: Starting with aerobic activity 30 plus minutes a day, 3 days per week for initial exercise prescription. Provide home exercise prescription and guidelines that participant acknowledges understanding prior to discharge.  Activity Barriers & Risk Stratification:     Activity Barriers & Cardiac Risk Stratification - 01/23/17 0915      Activity Barriers & Cardiac Risk Stratification   Activity Barriers Deconditioning;Joint Problems;Back Problems;Other (comment)   Comments leg pain   Cardiac Risk Stratification High      6 Minute Walk:     6 Minute Walk    Row Name 01/23/17 0913 01/23/17 1140       6 Minute Walk   Phase Initial  -     Distance 1770 feet  -    Walk Time 6 minutes  -    # of Rest Breaks 0  -    MPH  - 3.35    METS  - 4.26    RPE 12  -    VO2 Peak  - 14.9    Symptoms Yes (comment)  -    Comments fatigue! Lower leg tightness(calf region)  -    Resting HR 78 bpm  -    Resting BP 108/60  -  Max Ex. HR 92 bpm  -    Max Ex. BP 118/64  -    2 Minute Post BP  - 108/72       Oxygen Initial Assessment:   Oxygen Re-Evaluation:   Oxygen Discharge (Final Oxygen Re-Evaluation):   Initial Exercise Prescription:     Initial Exercise Prescription - 01/23/17 1100      Date of Initial Exercise RX and Referring Provider   Date 01/23/17   Referring Provider Glori Bickers MD     Treadmill   MPH 2.5   Grade 1   Minutes 10   METs 3.26     Bike   Level 0.8   Minutes 10   METs 2.78     NuStep   Level 3   SPM 80   Minutes 10   METs 2     Prescription Details   Frequency (times per week) 3   Duration Progress to 30 minutes of continuous aerobic without signs/symptoms of physical distress     Intensity   THRR 40-80% of Max Heartrate 68-135   Ratings of Perceived Exertion 11-15   Perceived Dyspnea 0-4     Progression   Progression Continue to progress workloads to maintain intensity without signs/symptoms of physical distress.     Resistance Training   Training Prescription Yes   Weight 3lbs   Reps 10-15      Perform Capillary Blood Glucose checks as needed.  Exercise Prescription Changes:     Exercise Prescription Changes    Row Name 02/02/17 1500 02/16/17 1226 03/06/17 1200 03/23/17 1451 04/03/17 1600     Response to Exercise   Blood Pressure (Admit) 124/84 104/60 124/84 106/58 96/60  asymptomatic   Blood Pressure (Exercise) 150/84 140/84 150/84 138/62 124/70   Blood Pressure (Exit) 108/64 104/60 108/64 118/60 100/60   Heart Rate (Admit) 78 bpm 83 bpm 78 bpm 77 bpm 83 bpm   Heart Rate (Exercise) 94 bpm 99 bpm 94 bpm 94 bpm 91 bpm   Heart Rate (Exit) 78 bpm 83 bpm 78  bpm 73 bpm 68 bpm   Rating of Perceived Exertion (Exercise)  -  -  - 13 13   Symptoms none noe none none none   Comments Pt was oriented to exercise equipment on 01/29/17   - Pt was oriented to exercise equipment on 01/29/17   -  -   Duration Continue with 30 min of aerobic exercise without signs/symptoms of physical distress. Continue with 30 min of aerobic exercise without signs/symptoms of physical distress. Continue with 30 min of aerobic exercise without signs/symptoms of physical distress. Continue with 30 min of aerobic exercise without signs/symptoms of physical distress. Continue with 30 min of aerobic exercise without signs/symptoms of physical distress.   Intensity THRR unchanged THRR unchanged THRR unchanged THRR unchanged THRR unchanged     Progression   Progression Continue to progress workloads to maintain intensity without signs/symptoms of physical distress. Continue to progress workloads to maintain intensity without signs/symptoms of physical distress. Continue to progress workloads to maintain intensity without signs/symptoms of physical distress. Continue to progress workloads to maintain intensity without signs/symptoms of physical distress. Continue to progress workloads to maintain intensity without signs/symptoms of physical distress.   Average METs 2.6 3.3 3.2 3.3 3.4     Resistance Training   Training Prescription Yes Yes Yes Yes Yes   Weight 3lbs 3lbs 3lbs 3lbs 3lbs   Reps 10-15 10-15 10-15 10-15 10-15   Time 10 Minutes 10 Minutes  10 Minutes 10 Minutes 10 Minutes     Treadmill   MPH 2.5 2.8 2.5 3 3    Grade 1 3 1 2 2    Minutes 10 10 10 10 10    METs 3.26 4.3 4.3 4.12 4.12     Bike   Level 0.8 0.8 0.8 0.8 0.8   Minutes 10 10 10 10 10    METs 2.78 2.78 2.78 2.72 2.72     NuStep   Level 3 4 4 4 4    SPM 80 80 80 90 85   Minutes 10 10 10 10 10    METs 1.9 2.8 2.5 3.1 2.7     Home Exercise Plan   Plans to continue exercise at  -  - [employer] (comment)   walking at [employer] (comment)  walking at [employer] (comment)  walking at [employer] track   Frequency  -  - Add 2 additional days to program exercise sessions. Add 2 additional days to program exercise sessions. Add 2 additional days to program exercise sessions.   Initial Home Exercises Provided  -  - 02/12/17 02/12/17 02/12/17      Exercise Comments:     Exercise Comments    Row Name 01/29/17 1513 02/12/17 0955 03/06/17 1218 04/03/17 1611     Exercise Comments Pt oriented to exercise equipment and responded well to exercise. Pt reported no signs of CP, dizziness and unusual SOB with exercise. Will continue to monitor pt's activity levels. Reviewed HEP on 02/12/17 Reviewed METs and goals. Pt is tolerating exercises very well; will continue to monitor pt's progress and activity levels. Reviewed METs and goals. Pt is tolerating exercises very well; will continue to monitor pt's progress and activity levels.       Exercise Goals and Review:     Exercise Goals    Row Name 01/23/17 0916             Exercise Goals   Increase Physical Activity Yes       Intervention Provide advice, education, support and counseling about physical activity/exercise needs.;Develop an individualized exercise prescription for aerobic and resistive training based on initial evaluation findings, risk stratification, comorbidities and participant's personal goals.       Expected Outcomes Achievement of increased cardiorespiratory fitness and enhanced flexibility, muscular endurance and strength shown through measurements of functional capacity and personal statement of participant.       Increase Strength and Stamina Yes       Intervention Provide advice, education, support and counseling about physical activity/exercise needs.;Develop an individualized exercise prescription for aerobic and resistive training based on initial evaluation findings, risk  stratification, comorbidities and participant's personal goals.       Expected Outcomes Achievement of increased cardiorespiratory fitness and enhanced flexibility, muscular endurance and strength shown through measurements of functional capacity and personal statement of participant.          Exercise Goals Re-Evaluation :     Exercise Goals Re-Evaluation    Row Name 02/12/17 0954 03/06/17 1217 03/06/17 1229 04/03/17 1610       Exercise Goal Re-Evaluation   Exercise Goals Review Increase Physical Activity;Increase Strenth and Stamina  - Increase Physical Activity;Increase Strenth and Stamina Increase Physical Activity;Able to understand and use rate of perceived exertion (RPE) scale;Knowledge and understanding of Target Heart Rate Range (THRR);Understanding of Exercise Prescription;Increase Strength and Stamina;Able to check pulse independently    Comments Reviewed home exercise with pt today.  Pt plans  to walk at [facility] for exercise, 2x/week in addition to coming to cardiac rehab.  Reviewed THR, pulse, RPE, sign and symptoms, and when to call 911 or MD.  Also discussed weather considerations and indoor options.  Pt voiced understanding. Pt is doing well in cardiac rehab. Pt is also very active with HEP and going to the walking track at [facility]. Pt walks ~33miles, 2x/week in addition to coming to cardiac rehab.  - Pt has increased walking tolerance (speed and distance) and feels more confident with "exercising on her own" walking on track at [facility].    Expected Outcomes Pt will be compliant with HEP and improve on cardirespiratory fitness. Pt will be compliant with HEP and improve on cardirespiratory fitness.  - Pt will be compliant with HEP and improve on cardirespiratory fitness.        Discharge Exercise Prescription (Final Exercise Prescription Changes):     Exercise Prescription Changes - 04/03/17 1600      Response to Exercise   Blood Pressure (Admit) 96/60  asymptomatic    Blood Pressure (Exercise) 124/70   Blood Pressure (Exit) 100/60   Heart Rate (Admit) 83 bpm   Heart Rate (Exercise) 91 bpm   Heart Rate (Exit) 68 bpm   Rating of Perceived Exertion (Exercise) 13   Symptoms none   Duration Continue with 30 min of aerobic exercise without signs/symptoms of physical distress.   Intensity THRR unchanged     Progression   Progression Continue to progress workloads to maintain intensity without signs/symptoms of physical distress.   Average METs 3.4     Resistance Training   Training Prescription Yes   Weight 3lbs   Reps 10-15   Time 10 Minutes     Treadmill   MPH 3   Grade 2   Minutes 10   METs 4.12     Bike   Level 0.8   Minutes 10   METs 2.72     NuStep   Level 4   SPM 85   Minutes 10   METs 2.7     Home Exercise Plan   Plans to continue exercise at [facility] (comment)  walking at [facility] track   Frequency Add 2 additional days to program exercise sessions.   Initial Home Exercises Provided 02/12/17      Nutrition:  Target Goals: Understanding of nutrition guidelines, daily intake of sodium 1500mg , cholesterol 200mg , calories 30% from fat and 7% or less from saturated fats, daily to have 5 or more servings of fruits and vegetables.  Biometrics:     Pre Biometrics - 01/23/17 0932      Pre Biometrics   Waist Circumference 40 inches   Hip Circumference 42.5 inches   Waist to Hip Ratio 0.94 %   Triceps Skinfold 46 mm   Grip Strength 34 kg   Flexibility 14 in   Single Leg Stand 6.31 seconds         Post Biometrics - 01/23/17 1144       Post  Biometrics   % Body Fat 44.9 %      Nutrition Therapy Plan and Nutrition Goals:     Nutrition Therapy & Goals - 02/07/17 1103      Nutrition Therapy   Diet Carb Modified, Therapeutic Lifestyle Changes     Personal Nutrition Goals   Nutrition Goal Improved glycemic control as evidenced by pt's A1c trending from 7.3 to less than 7   Personal Goal #2 Pt to  identify food quantities necessary to achieve weight loss of 6-24 lb (2.7-10.9 kg) at graduation from cardiac rehab. Goal wt of 165-175 lb desired.      Intervention Plan   Intervention Prescribe, educate and counsel regarding individualized specific dietary modifications aiming towards targeted core components such as weight, hypertension, lipid management, diabetes, heart failure and other comorbidities.;Nutrition handout(s) given to patient.  1500 kcal, 5 day menu ideas; Type 2 DM Nutrition Therapy   Expected Outcomes Short Term Goal: Understand basic principles of dietary content, such as calories, fat, sodium, cholesterol and nutrients.;Long Term Goal: Adherence to prescribed nutrition plan.      Nutrition Discharge: Nutrition Scores:   Nutrition Goals Re-Evaluation:   Nutrition Goals Re-Evaluation:   Nutrition Goals Discharge (Final Nutrition Goals Re-Evaluation):   Psychosocial: Target Goals: Acknowledge presence or absence of significant depression and/or stress, maximize coping skills, provide positive support system. Participant is able to verbalize types and ability to use techniques and skills needed for reducing stress and depression.  Initial Review & Psychosocial Screening:     Initial Psych Review & Screening - 01/23/17 1125      Initial Review   Current issues with Current Stress Concerns;Current Anxiety/Panic   Source of Stress Concerns Chronic Illness;Poor Coping Skills;Family;Financial   Comments pt with many stressors due to recent cardiac events and family situation.       Family Dynamics   Good Support System? No   Strains Intra-family strains   Concerns No support system   Comments pt has current conflict in her family unit. pt also experiencing break up with her boyfriend.  pt admits to feeling isolated and alone. pt demonstrates strong faith base but no church or social resources for support.      Barriers   Psychosocial barriers to participate in  program The patient should benefit from training in stress management and relaxation.;Psychosocial barriers identified (see note)  pt with multiple barriers including financial, transportation and lack of support system. pt offered emotional support and reassurance..       Screening Interventions   Interventions Encouraged to exercise;To provide support and resources with identified psychosocial needs;Program counselor consult  pt offered appt with Jeanella Craze and to schedule follow up with her counselor Jonita Albee.        Quality of Life Scores:     Quality of Life - 01/23/17 1131      Quality of Life Scores   Health/Function Pre 10.4 %  pt with multiple concerns about her health and disability.     Socioeconomic Pre 10.5 %  pt without financial resources awaiting disability.  pt having difficulty paying  bills.    Psych/Spiritual Pre 17.14 %  pt demonstrates strong faith base however facing many overwhelming life circumstances. pt reports active prayer life however is not connected to church for support.      Family Pre 6 %  pt has current conflict with her adult children, boyfriend and other relatives. pt feels isolated and withdrawn.    GLOBAL Pre 11.14 %  pt exhibits symptoms of poor coping strategies and being overwhelmed with current life stressors. pt accepting of offer to schedule appt with Jeanella Craze and her therapist Jonita Albee.       PHQ-9: Recent Review Flowsheet Data    Depression screen [facility] 2/9 03/12/2017 01/29/2017 09/01/2016 08/23/2016 06/30/2016   Decreased Interest 2 3 1  0 1   Down, Depressed, Hopeless 2 3 1 1 1    PHQ - 2 Score 4 6  2 1 2    Altered sleeping 3 3 1  3  3     Tired, decreased energy 3 2 0 3 3   Change in appetite 2  1 0 1 1   Feeling bad or failure about yourself  3 1 0 2 1   Trouble concentrating 3  1 0 1 1   Moving slowly or fidgety/restless 3 1 0 0  1    Suicidal thoughts 0 0 0 0 0   PHQ-9 Score 21 15 3 11 12    Difficult doing work/chores  Somewhat difficult Very difficult Somewhat difficult Somewhat difficult Somewhat difficult     Interpretation of Total Score  Total Score Depression Severity:  1-4 = Minimal depression, 5-9 = Mild depression, 10-14 = Moderate depression, 15-19 = Moderately severe depression, 20-27 = Severe depression   Psychosocial Evaluation and Intervention:     Psychosocial Evaluation - 01/29/17 1631      Psychosocial Evaluation & Interventions   Interventions Therapist referral;Physician referral;Stress management education;Relaxation education;Encouraged to exercise with the program and follow exercise prescription   Comments pt with current situational stress, health related anxiety and lack of adequate support systems.  pt referred to reestablish care with her therapist.  appt made with Jeanella Craze 02/07/2017 @945 .  PHQ and QOL scores forwarded to pt PCP for evaluation.     Expected Outcomes pt will participate in therapeutic care to develop good coping skills and improved outlook.    Continue Psychosocial Services  Follow up required by staff      Psychosocial Re-Evaluation:     Psychosocial Re-Evaluation    Row Name 02/05/17 1218 03/06/17 0814 04/04/17 1113         Psychosocial Re-Evaluation   Current issues with Current Stress Concerns;History of Depression;Current Depression Current Stress Concerns;History of Depression;Current Depression Current Stress Concerns;History of Depression;Current Depression     Comments appt made with pt therapist Jalia Zuniga for 02/15/2017.  pt also has upcoming [employer] appt.  pt is eagerly participating in CR program and displays improving outlook.  pt unable to see therapist due to insurance denial. pt has seen Jeanella Craze with good results.  pt demonstrates increased stress management and coping strategies.   pt is eagerly participating in CR program and displays improving outlook.  pt exhibits decreased stress and anxiety. pt exhibits improved outlook  with better coping skills.      Expected Outcomes pt will demonstrate positive outlook with good coping skills. pt will demonstrate positive outlook with good coping skills. pt will demonstrate positive outlook with good coping skills.     Interventions Therapist referral;Encouraged to attend Cardiac Rehabilitation for the exercise;Stress management education;Relaxation education Therapist referral;Encouraged to attend Cardiac Rehabilitation for the exercise;Stress management education;Relaxation education Therapist referral;Encouraged to attend Cardiac Rehabilitation for the exercise;Stress management education;Relaxation education     Continue Psychosocial Services  Follow up required by staff Follow up required by staff Follow up required by staff     Comments pt with many stressors due to recent cardiac events and family situation.    - pt with many stressors due to recent cardiac events and family situation.         Initial Review   Source of Stress Concerns Chronic Illness;Poor Coping Skills;Family;Financial  - Chronic Illness;Poor Coping Skills;Family;Financial        Psychosocial Discharge (Final Psychosocial Re-Evaluation):     Psychosocial Re-Evaluation - 04/04/17 1113      Psychosocial Re-Evaluation   Current issues with Current Stress  Concerns;History of Depression;Current Depression   Comments pt exhibits decreased stress and anxiety. pt exhibits improved outlook with better coping skills.    Expected Outcomes pt will demonstrate positive outlook with good coping skills.   Interventions Therapist referral;Encouraged to attend Cardiac Rehabilitation for the exercise;Stress management education;Relaxation education   Continue Psychosocial Services  Follow up required by staff   Comments pt with many stressors due to recent cardiac events and family situation.       Initial Review   Source of Stress Concerns Chronic Illness;Poor Coping Skills;Family;Financial      Vocational  Rehabilitation: Provide vocational rehab assistance to qualifying candidates.   Vocational Rehab Evaluation & Intervention:     Vocational Rehab - 01/23/17 1151      Initial Vocational Rehab Evaluation & Intervention   Assessment shows need for Vocational Rehabilitation No      Education: Education Goals: Education classes will be provided on a weekly basis, covering required topics. Participant will state understanding/return demonstration of topics presented.  Learning Barriers/Preferences:     Learning Barriers/Preferences - 01/23/17 0914      Learning Barriers/Preferences   Learning Barriers Sight   Learning Preferences Written Material;Pictoral;Video      Education Topics: Count Your Pulse:  -Group instruction provided by verbal instruction, demonstration, patient participation and written materials to support subject.  Instructors address importance of being able to find your pulse and how to count your pulse when at home without a heart monitor.  Patients get hands on experience counting their pulse with staff help and individually.   Heart Attack, Angina, and Risk Factor Modification:  -Group instruction provided by verbal instruction, video, and written materials to support subject.  Instructors address signs and symptoms of angina and heart attacks.    Also discuss risk factors for heart disease and how to make changes to improve heart health risk factors.   CARDIAC REHAB PHASE II EXERCISE from 04/04/2017 in [place]  Date  02/14/17  Instruction Review Code  2- meets goals/outcomes      Functional Fitness:  -Group instruction provided by verbal instruction, demonstration, patient participation, and written materials to support subject.  Instructors address safety measures for doing things around the house.  Discuss how to get up and down off the floor, how to pick things up properly, how to safely get out of a chair without  assistance, and balance training.   CARDIAC REHAB PHASE II EXERCISE from 04/04/2017 in [place]  Date  03/09/17  Instruction Review Code  2- meets goals/outcomes      Meditation and Mindfulness:  -Group instruction provided by verbal instruction, patient participation, and written materials to support subject.  Instructor addresses importance of mindfulness and meditation practice to help reduce stress and improve awareness.  Instructor also leads participants through a meditation exercise.    Stretching for Flexibility and Mobility:  -Group instruction provided by verbal instruction, patient participation, and written materials to support subject.  Instructors lead participants through series of stretches that are designed to increase flexibility thus improving mobility.  These stretches are additional exercise for major muscle groups that are typically performed during regular warm up and cool down.   CARDIAC REHAB PHASE II EXERCISE from 04/04/2017 in [place]  Date  03/16/17  Educator  EP  Instruction Review Code  2- meets goals/outcomes      Hands Only CPR:  -Group verbal, video, and participation provides a  basic overview of AHA guidelines for community CPR. Role-play of emergencies allow participants the opportunity to practice calling for help and chest compression technique with discussion of AED use.   Hypertension: -Group verbal and written instruction that provides a basic overview of hypertension including the most recent diagnostic guidelines, risk factor reduction with self-care instructions and medication management.    Nutrition I class: Heart Healthy Eating:  -Group instruction provided by PowerPoint slides, verbal discussion, and written materials to support subject matter. The instructor gives an explanation and review of the Therapeutic Lifestyle Changes diet recommendations, which includes a  discussion on lipid goals, dietary fat, sodium, fiber, plant stanol/sterol esters, sugar, and the components of a well-balanced, healthy diet.   Nutrition II class: Lifestyle Skills:  -Group instruction provided by PowerPoint slides, verbal discussion, and written materials to support subject matter. The instructor gives an explanation and review of label reading, grocery shopping for heart health, heart healthy recipe modifications, and ways to make healthier choices when eating out.   Diabetes Question & Answer:  -Group instruction provided by PowerPoint slides, verbal discussion, and written materials to support subject matter. The instructor gives an explanation and review of diabetes co-morbidities, pre- and post-prandial blood glucose goals, pre-exercise blood glucose goals, signs, symptoms, and treatment of hypoglycemia and hyperglycemia, and foot care basics.   CARDIAC REHAB PHASE II EXERCISE from 04/04/2017 in [place]  Date  02/23/17  Educator  RD  Instruction Review Code  2- meets goals/outcomes      Diabetes Blitz:  -Group instruction provided by PowerPoint slides, verbal discussion, and written materials to support subject matter. The instructor gives an explanation and review of the physiology behind type 1 and type 2 diabetes, diabetes medications and rational behind using different medications, pre- and post-prandial blood glucose recommendations and Hemoglobin A1c goals, diabetes diet, and exercise including blood glucose guidelines for exercising safely.    Portion Distortion:  -Group instruction provided by PowerPoint slides, verbal discussion, written materials, and food models to support subject matter. The instructor gives an explanation of serving size versus portion size, changes in portions sizes over the last 20 years, and what consists of a serving from each food group.   CARDIAC REHAB PHASE II EXERCISE from 04/04/2017 in [place]  Date  03/14/17  Educator  RD  Instruction Review Code  2- meets goals/outcomes      Stress Management:  -Group instruction provided by verbal instruction, video, and written materials to support subject matter.  Instructors review role of stress in heart disease and how to cope with stress positively.     CARDIAC REHAB PHASE II EXERCISE from 04/04/2017 in [place]  Date  03/21/17  Instruction Review Code  2- meets goals/outcomes      Exercising on Your Own:  -Group instruction provided by verbal instruction, power point, and written materials to support subject.  Instructors discuss benefits of exercise, components of exercise, frequency and intensity of exercise, and end points for exercise.  Also discuss use of nitroglycerin and activating EMS.  Review options of places to exercise outside of rehab.  Review guidelines for sex with heart disease.   CARDIAC REHAB PHASE II EXERCISE from 04/04/2017 in [place]  Date  03/28/17  Instruction Review Code  2- meets goals/outcomes      Cardiac Drugs I:  -Group instruction provided by verbal instruction and written materials to support  subject.  Instructor reviews cardiac drug classes: antiplatelets, anticoagulants, beta blockers, and statins.  Instructor discusses reasons, side effects, and lifestyle considerations for each drug class.   CARDIAC REHAB PHASE II EXERCISE from 04/04/2017 in [place]  Date  04/04/17  Instruction Review Code  2- meets goals/outcomes      Cardiac Drugs II:  -Group instruction provided by verbal instruction and written materials to support subject.  Instructor reviews cardiac drug classes: angiotensin converting enzyme inhibitors (ACE-I), angiotensin II receptor blockers (ARBs), nitrates, and calcium channel blockers.  Instructor discusses reasons, side effects, and lifestyle  considerations for each drug class.   Anatomy and Physiology of the Circulatory System:  Group verbal and written instruction and models provide basic cardiac anatomy and physiology, with the coronary electrical and arterial systems. Review of: AMI, Angina, Valve disease, Heart Failure, Peripheral Artery Disease, Cardiac Arrhythmia, Pacemakers, and the ICD.   CARDIAC REHAB PHASE II EXERCISE from 04/04/2017 in [place]  Date  02/21/17  Instruction Review Code  2- meets goals/outcomes      Other Education:  -Group or individual verbal, written, or video instructions that support the educational goals of the cardiac rehab program.   Knowledge Questionnaire Score:     Knowledge Questionnaire Score - 01/23/17 0901      Knowledge Questionnaire Score   Pre Score 21/24      Core Components/Risk Factors/Patient Goals at Admission:     Personal Goals and Risk Factors at Admission - 01/23/17 0933      Core Components/Risk Factors/Patient Goals on Admission   Hypertension Yes   Intervention Provide education on lifestyle modifcations including regular physical activity/exercise, weight management, moderate sodium restriction and increased consumption of fresh fruit, vegetables, and low fat dairy, alcohol moderation, and smoking cessation.;Monitor prescription use compliance.   Expected Outcomes Short Term: Continued assessment and intervention until BP is < 140/58mm HG in hypertensive participants. < 130/74mm HG in hypertensive participants with diabetes, heart failure or chronic kidney disease.;Long Term: Maintenance of blood pressure at goal levels.   Stress Yes   Intervention Offer individual and/or small group education and counseling on adjustment to heart disease, stress management and health-related lifestyle change. Teach and support self-help strategies.;Refer participants experiencing significant psychosocial distress to appropriate mental health  specialists for further evaluation and treatment. When possible, include family members and significant others in education/counseling sessions.   Expected Outcomes Short Term: Participant demonstrates changes in health-related behavior, relaxation and other stress management skills, ability to obtain effective social support, and compliance with psychotropic medications if prescribed.;Long Term: Emotional wellbeing is indicated by absence of clinically significant psychosocial distress or social isolation.      Core Components/Risk Factors/Patient Goals Review:      Goals and Risk Factor Review    Row Name 02/05/17 1217 03/06/17 0814 04/04/17 1113         Core Components/Risk Factors/Patient Goals Review   Personal Goals Review Weight Management/Obesity;Heart Failure;Hypertension;Lipids;Diabetes;Other;Stress Weight Management/Obesity;Heart Failure;Hypertension;Lipids;Diabetes;Other;Stress Weight Management/Obesity;Heart Failure;Hypertension;Lipids;Diabetes;Other;Stress     Review pt with multiple CAD RF demonstrates eagerness to participate in CR program.  pt with multiple CAD RF demonstrates eagerness to participate in CR program.  pt with multiple CAD RF demonstrates eagerness to participate in CR program.      Expected Outcomes pt will participate in CR exercise, nutrition and lifestyle modification education classes to decrease overall CAD RF. pt will participate in CR exercise, nutrition and lifestyle modification education classes to decrease overall  CAD RF. pt will participate in CR exercise, nutrition and lifestyle modification education classes to decrease overall CAD RF.        Core Components/Risk Factors/Patient Goals at Discharge (Final Review):      Goals and Risk Factor Review - 04/04/17 1113      Core Components/Risk Factors/Patient Goals Review   Personal Goals Review Weight Management/Obesity;Heart Failure;Hypertension;Lipids;Diabetes;Other;Stress   Review pt with  multiple CAD RF demonstrates eagerness to participate in CR program.    Expected Outcomes pt will participate in CR exercise, nutrition and lifestyle modification education classes to decrease overall CAD RF.      ITP Comments:     ITP Comments    Row Name 01/23/17 0856 02/05/17 1216 03/08/17 0947 04/04/17 1113     ITP Comments Dr. Fransico Him, Medical Director  Dr. Fransico Him, Medical Director  Dr. Fransico Him, Medical Director  Dr. Fransico Him, Medical Director        Comments: Pt is making expected progress toward personal goals after completing 24 sessions. Recommend continued exercise and life style modification education including  stress management and relaxation techniques to decrease cardiac risk profile.

## 2017-04-04 NOTE — Progress Notes (Signed)
Patient ID: Susan Peck, female   DOB: Oct 15, 1965, 51 y.o.   MRN: [id number]  Case discussed with Dr. Danford Bad at the time of the visit.  We reviewed the resident's history and exam and pertinent patient test results.  I agree with the assessment, diagnosis and plan of care documented in the resident's note.

## 2017-04-04 NOTE — Progress Notes (Signed)
PCP - Shela Leff - bethany Molt Cardiologist - Benismone - device klein  Chest x-ray - 12/14/16 EKG - 03/28/17 Stress Test - 2016 ECHO - 2018 Cardiac Cath - 2017  Sleep Study - 2012 CPAP - doesn't wear because its broken  Fasting Blood Sugar - 240s Checks Blood Sugar __1___ times a day  Sending to anesthiesa due to a1c - patient stated that she doesn't think her cardiologist is aware of surgery   Patient denies shortness of breath, fever, cough and chest pain at PAT appointment   Patient verbalized understanding of instructions that were given to them at the PAT appointment. Patient was also instructed that they will need to review over the PAT instructions again at home before surgery.

## 2017-04-04 NOTE — H&P (Signed)
HISTORY AND PHYSICAL  Susan Peck is a 51 y.o. female patient with severe dental phobia referred by general dentist for removal of 4 carious teeth. Complains of dental pain.  No diagnosis found.  Past Medical History:  Diagnosis Date  . AICD (automatic cardioverter/defibrillator) present 12/13/2016  . Anxiety    no meds  . Asthma   . CHF (congestive heart failure) ([place])   . Depression    no meds  . Diabetes mellitus    recent dx 11/17/11 - started med 11/18/11 type 2  . GERD (gastroesophageal reflux disease)   . Goiter 07/27/2011   non-neoplastic goiter - fine needle aspiration - benign sees dr Dwyane Dee for  . Heart murmur    dx 2 yrs ago per pt  . Herpes genitalis in women   . Hypertension   . IBS (irritable bowel syndrome)    tx with diet per pt  . Low back pain    history  . Obesity   . Seasonal allergies   . Shortness of breath    occasional - exercise induced  . Sleep apnea    cpap broken  . Systolic heart failure    May 2011 EF 35-40%, 04/03/11 EF 25-30%, 06/27/11 EF 30-35%    No current facility-administered medications for this encounter.    Current Outpatient Prescriptions  Medication Sig Dispense Refill  . albuterol (PROVENTIL HFA;VENTOLIN HFA) 108 (90 Base) MCG/ACT inhaler Inhale 1-2 puffs into the lungs every 6 (six) hours as needed for wheezing or shortness of breath. 1 Inhaler 0  . aspirin 81 MG chewable tablet Chew 81 mg by mouth daily.     . Aspirin-Acetaminophen-Caffeine (GOODY HEADACHE PO) Take 1 packet by mouth daily as needed (pain/headache).    . carvedilol (COREG) 25 MG tablet Take 1 tablet (25 mg total) by mouth 2 (two) times daily with a meal. 60 tablet 11  . estradiol (ESTRACE) 1 MG tablet Take 1 mg by mouth daily.    . fluticasone (FLONASE) 50 MCG/ACT nasal spray Place 1 spray into both nostrils 2 (two) times daily as needed for allergies or rhinitis. (Patient taking differently: Place 1 spray into both nostrils daily as needed for allergies or  rhinitis. ) 16 g 3  . furosemide (LASIX) 40 MG tablet TAKE 1 TABLET (40 MG TOTAL) BY MOUTH 2 (TWO) TIMES DAILY. 60 tablet 3  . glimepiride (AMARYL) 2 MG tablet Take 2 mg by mouth daily with breakfast.     . ketoconazole (NIZORAL) 2 % cream Apply 1 application topically daily.  2  . levothyroxine (SYNTHROID, LEVOTHROID) 50 MCG tablet Take 1 tablet (50 mcg total) by mouth daily. 30 tablet 4  . loratadine (CLARITIN) 10 MG tablet Take 1 tablet (10 mg total) by mouth daily. (Patient taking differently: Take 10 mg by mouth daily as needed for allergies. ) 30 tablet 2  . pantoprazole (PROTONIX) 40 MG tablet Take 1 tablet (40 mg total) by mouth daily. 90 tablet 0  . PATADAY 0.2 % SOLN Place 1 drop into both eyes daily.  4  . sacubitril-valsartan (ENTRESTO) 97-103 MG Take 1 tablet by mouth 2 (two) times daily. 60 tablet 11  . sodium chloride (OCEAN) 0.65 % SOLN nasal spray Place 2 sprays into both nostrils daily as needed for congestion.    Marland Kitchen atorvastatin (LIPITOR) 40 MG tablet Take 40 mg by mouth daily.    Marland Kitchen BAYER CONTOUR TEST test strip   1  . PRESCRIPTION MEDICATION Inhale into the lungs at  bedtime. CPAP    . ranitidine (ZANTAC) 150 MG tablet Take 1 tablet (150 mg total) by mouth 2 (two) times daily. (Patient not taking: Reported on 04/03/2017) 180 tablet 3   Facility-Administered Medications Ordered in Other Encounters  Medication Dose Route Frequency Provider Last Rate Last Dose  . aminophylline injection 150 mg  150 mg Intravenous BID PRN Larey Dresser, MD   150 mg at 12/24/14 1005   Allergies  Allergen Reactions  . Shrimp [Shellfish Allergy] Anaphylaxis   Active Problems:   * No active hospital problems. *  Vitals: There were no vitals taken for this visit. Lab results: Results for orders placed or performed during the hospital encounter of 04/04/17 (from the past 24 hour(s))  Basic metabolic panel     Status: Abnormal   Collection Time: 04/04/17 11:06 AM  Result Value Ref Range    Sodium 135 135 - 145 mmol/L   Potassium 3.8 3.5 - 5.1 mmol/L   Chloride 102 101 - 111 mmol/L   CO2 26 22 - 32 mmol/L   Glucose, Bld 276 (H) 65 - 99 mg/dL   BUN 9 6 - 20 mg/dL   Creatinine, Ser 0.62 0.44 - 1.00 mg/dL   Calcium 9.2 8.9 - 10.3 mg/dL   GFR calc non Af Amer >60 >60 mL/min   GFR calc Af Amer >60 >60 mL/min   Anion gap 7 5 - 15  CBC     Status: None   Collection Time: 04/04/17 11:06 AM  Result Value Ref Range   WBC 6.7 4.0 - 10.5 K/uL   RBC 4.75 3.87 - 5.11 MIL/uL   Hemoglobin 13.3 12.0 - 15.0 g/dL   HCT 37.9 36.0 - 46.0 %   MCV 79.8 78.0 - 100.0 fL   MCH 28.0 26.0 - 34.0 pg   MCHC 35.1 30.0 - 36.0 g/dL   RDW 12.4 11.5 - 15.5 %   Platelets 205 150 - 400 K/uL  Glucose, capillary     Status: Abnormal   Collection Time: 04/04/17 11:25 AM  Result Value Ref Range   Glucose-Capillary 243 (H) 65 - 99 mg/dL   Radiology Results: No results found. General appearance: alert, cooperative and moderately obese Head: Normocephalic, without obvious abnormality, atraumatic Eyes: negative Nose: Nares normal. Septum midline. Mucosa normal. No drainage or sinus tenderness. Throat: Carious teeth $# 5, 7, 16, 17. No edema, purulence. Pharynx clear. Neck: no adenopathy, supple, symmetrical, trachea midline and thyroid not enlarged, symmetric, no tenderness/mass/nodules Resp: clear to auscultation bilaterally Cardio: regular rate and rhythm, S1, S2 normal, no murmur, click, rub or gallop  Assessment: Non-restorable teeth 5, 7, 16, 17 secondary to dental caries.   Plan: Extraction teeth 5, 7, 16, 17 . GA. Oral or nasal. Day surgery   Judge Duque M 04/04/2017

## 2017-04-05 ENCOUNTER — Encounter (HOSPITAL_COMMUNITY): Payer: Self-pay | Admitting: *Deleted

## 2017-04-05 ENCOUNTER — Ambulatory Visit (HOSPITAL_COMMUNITY): Payer: Medicare Other | Admitting: Certified Registered"

## 2017-04-05 ENCOUNTER — Ambulatory Visit (HOSPITAL_COMMUNITY)
Admission: RE | Admit: 2017-04-05 | Discharge: 2017-04-05 | Disposition: A | Discharge status: Home / Self Care | Payer: Medicare Other | Source: Ambulatory Visit | Attending: Oral Surgery | Admitting: Oral Surgery

## 2017-04-05 ENCOUNTER — Encounter (HOSPITAL_COMMUNITY)
Admission: RE | Disposition: A | Discharge status: Home / Self Care | Payer: Self-pay | Source: Ambulatory Visit | Attending: Oral Surgery

## 2017-04-05 DIAGNOSIS — E119 Type 2 diabetes mellitus without complications: Secondary | ICD-10-CM | POA: Insufficient documentation

## 2017-04-05 DIAGNOSIS — Z91013 Allergy to seafood: Secondary | ICD-10-CM | POA: Insufficient documentation

## 2017-04-05 DIAGNOSIS — Z6831 Body mass index (BMI) 31.0-31.9, adult: Secondary | ICD-10-CM | POA: Insufficient documentation

## 2017-04-05 DIAGNOSIS — Z7982 Long term (current) use of aspirin: Secondary | ICD-10-CM | POA: Diagnosis not present

## 2017-04-05 DIAGNOSIS — Z79899 Other long term (current) drug therapy: Secondary | ICD-10-CM | POA: Diagnosis not present

## 2017-04-05 DIAGNOSIS — E039 Hypothyroidism, unspecified: Secondary | ICD-10-CM | POA: Insufficient documentation

## 2017-04-05 DIAGNOSIS — K029 Dental caries, unspecified: Secondary | ICD-10-CM | POA: Insufficient documentation

## 2017-04-05 DIAGNOSIS — A6000 Herpesviral infection of urogenital system, unspecified: Secondary | ICD-10-CM | POA: Insufficient documentation

## 2017-04-05 DIAGNOSIS — I10 Essential (primary) hypertension: Secondary | ICD-10-CM | POA: Diagnosis not present

## 2017-04-05 DIAGNOSIS — J45909 Unspecified asthma, uncomplicated: Secondary | ICD-10-CM | POA: Insufficient documentation

## 2017-04-05 DIAGNOSIS — G4730 Sleep apnea, unspecified: Secondary | ICD-10-CM | POA: Insufficient documentation

## 2017-04-05 DIAGNOSIS — E785 Hyperlipidemia, unspecified: Secondary | ICD-10-CM | POA: Diagnosis not present

## 2017-04-05 DIAGNOSIS — K219 Gastro-esophageal reflux disease without esophagitis: Secondary | ICD-10-CM | POA: Insufficient documentation

## 2017-04-05 DIAGNOSIS — R011 Cardiac murmur, unspecified: Secondary | ICD-10-CM | POA: Diagnosis not present

## 2017-04-05 DIAGNOSIS — I110 Hypertensive heart disease with heart failure: Secondary | ICD-10-CM | POA: Diagnosis not present

## 2017-04-05 DIAGNOSIS — Z95810 Presence of automatic (implantable) cardiac defibrillator: Secondary | ICD-10-CM | POA: Insufficient documentation

## 2017-04-05 DIAGNOSIS — I341 Nonrheumatic mitral (valve) prolapse: Secondary | ICD-10-CM | POA: Insufficient documentation

## 2017-04-05 DIAGNOSIS — Z7984 Long term (current) use of oral hypoglycemic drugs: Secondary | ICD-10-CM | POA: Insufficient documentation

## 2017-04-05 DIAGNOSIS — K589 Irritable bowel syndrome without diarrhea: Secondary | ICD-10-CM | POA: Insufficient documentation

## 2017-04-05 DIAGNOSIS — I5020 Unspecified systolic (congestive) heart failure: Secondary | ICD-10-CM | POA: Insufficient documentation

## 2017-04-05 DIAGNOSIS — G4733 Obstructive sleep apnea (adult) (pediatric): Secondary | ICD-10-CM | POA: Diagnosis not present

## 2017-04-05 HISTORY — PX: TOOTH EXTRACTION: SHX859

## 2017-04-05 LAB — GLUCOSE, CAPILLARY
Glucose-Capillary: 219 mg/dL — ABNORMAL HIGH (ref 65–99)
Glucose-Capillary: 199 mg/dL — ABNORMAL HIGH (ref 65–99)

## 2017-04-05 SURGERY — DENTAL RESTORATION/EXTRACTIONS
Anesthesia: General | Site: Mouth

## 2017-04-05 MED ORDER — ETOMIDATE 2 MG/ML IV SOLN
INTRAVENOUS | Status: AC
  Filled 2017-04-05: qty 10

## 2017-04-05 MED ORDER — LIDOCAINE-EPINEPHRINE 2 %-1:100000 IJ SOLN
INTRAMUSCULAR | Status: DC | PRN
  Administered 2017-04-05: 13 mL via INTRADERMAL

## 2017-04-05 MED ORDER — CEFAZOLIN SODIUM-DEXTROSE 2-4 GM/100ML-% IV SOLN
INTRAVENOUS | Status: AC
  Filled 2017-04-05: qty 100

## 2017-04-05 MED ORDER — LIDOCAINE 2% (20 MG/ML) 5 ML SYRINGE
INTRAMUSCULAR | Status: DC | PRN
  Administered 2017-04-05: 60 mg via INTRAVENOUS

## 2017-04-05 MED ORDER — ONDANSETRON HCL 4 MG/2ML IJ SOLN
INTRAMUSCULAR | Status: AC
  Filled 2017-04-05: qty 2

## 2017-04-05 MED ORDER — ETOMIDATE 2 MG/ML IV SOLN
INTRAVENOUS | Status: DC | PRN
  Administered 2017-04-05: 10 mg via INTRAVENOUS

## 2017-04-05 MED ORDER — CEFAZOLIN SODIUM-DEXTROSE 2-4 GM/100ML-% IV SOLN
2.0000 g | INTRAVENOUS | Status: AC
  Administered 2017-04-05: 2 g via INTRAVENOUS

## 2017-04-05 MED ORDER — OXYCODONE-ACETAMINOPHEN 5-325 MG PO TABS: 1.0000 | ORAL_TABLET | Freq: Four times a day (QID) | ORAL | 0 refills | Status: DC | PRN

## 2017-04-05 MED ORDER — OXYMETAZOLINE HCL 0.05 % NA SOLN
NASAL | Status: DC | PRN
  Administered 2017-04-05 (×2): 1 via NASAL

## 2017-04-05 MED ORDER — FENTANYL CITRATE (PF) 100 MCG/2ML IJ SOLN
25.0000 ug | INTRAMUSCULAR | Status: DC | PRN
  Administered 2017-04-05: 50 ug via INTRAVENOUS

## 2017-04-05 MED ORDER — LIDOCAINE 2% (20 MG/ML) 5 ML SYRINGE
INTRAMUSCULAR | Status: AC
  Filled 2017-04-05: qty 5

## 2017-04-05 MED ORDER — MIDAZOLAM HCL 2 MG/2ML IJ SOLN
INTRAMUSCULAR | Status: AC
  Filled 2017-04-05: qty 2

## 2017-04-05 MED ORDER — LACTATED RINGERS IV SOLN
INTRAVENOUS | Status: DC
  Administered 2017-04-05: 11:00:00 via INTRAVENOUS

## 2017-04-05 MED ORDER — ONDANSETRON HCL 4 MG/2ML IJ SOLN
4.0000 mg | Freq: Once | INTRAMUSCULAR | Status: AC | PRN
  Administered 2017-04-05: 4 mg via INTRAVENOUS

## 2017-04-05 MED ORDER — OXYMETAZOLINE HCL 0.05 % NA SOLN
NASAL | Status: DC | PRN
  Administered 2017-04-05: 1 via TOPICAL

## 2017-04-05 MED ORDER — FENTANYL CITRATE (PF) 250 MCG/5ML IJ SOLN
INTRAMUSCULAR | Status: AC
  Filled 2017-04-05: qty 5

## 2017-04-05 MED ORDER — MIDAZOLAM HCL 2 MG/2ML IJ SOLN
INTRAMUSCULAR | Status: DC | PRN
  Administered 2017-04-05: 2 mg via INTRAVENOUS

## 2017-04-05 MED ORDER — PHENYLEPHRINE HCL 10 MG/ML IJ SOLN
INTRAVENOUS | Status: DC | PRN
  Administered 2017-04-05: 25 ug/min via INTRAVENOUS

## 2017-04-05 MED ORDER — FENTANYL CITRATE (PF) 100 MCG/2ML IJ SOLN
INTRAMUSCULAR | Status: AC
  Filled 2017-04-05: qty 2

## 2017-04-05 MED ORDER — ROCURONIUM BROMIDE 10 MG/ML (PF) SYRINGE
PREFILLED_SYRINGE | INTRAVENOUS | Status: AC
  Filled 2017-04-05: qty 5

## 2017-04-05 MED ORDER — SUCCINYLCHOLINE CHLORIDE 200 MG/10ML IV SOSY
PREFILLED_SYRINGE | INTRAVENOUS | Status: AC
  Filled 2017-04-05: qty 10

## 2017-04-05 MED ORDER — SODIUM CHLORIDE 0.9 % IR SOLN
Status: DC | PRN
  Administered 2017-04-05: 1000 mL

## 2017-04-05 MED ORDER — AMOXICILLIN 500 MG PO CAPS: 500.0000 mg | ORAL_CAPSULE | Freq: Three times a day (TID) | ORAL | 0 refills | Status: DC

## 2017-04-05 MED ORDER — DEXAMETHASONE SODIUM PHOSPHATE 10 MG/ML IJ SOLN
INTRAMUSCULAR | Status: DC | PRN
  Administered 2017-04-05: 10 mg via INTRAVENOUS

## 2017-04-05 MED ORDER — DEXAMETHASONE SODIUM PHOSPHATE 10 MG/ML IJ SOLN
INTRAMUSCULAR | Status: AC
  Filled 2017-04-05: qty 1

## 2017-04-05 MED ORDER — ONDANSETRON HCL 4 MG/2ML IJ SOLN
INTRAMUSCULAR | Status: DC | PRN
  Administered 2017-04-05: 4 mg via INTRAVENOUS

## 2017-04-05 MED ORDER — FENTANYL CITRATE (PF) 100 MCG/2ML IJ SOLN
INTRAMUSCULAR | Status: DC | PRN
  Administered 2017-04-05: 50 ug via INTRAVENOUS
  Administered 2017-04-05: 100 ug via INTRAVENOUS

## 2017-04-05 SURGICAL SUPPLY — 31 items
BLADE SURG 15 STRL LF DISP TIS (BLADE) IMPLANT
BLADE SURG 15 STRL SS (BLADE)
BUR CROSS CUT FISSURE 1.6 (BURR) ×2 IMPLANT
BUR EGG ELITE 4.0 (BURR) ×2 IMPLANT
CANISTER SUCT 3000ML PPV (MISCELLANEOUS) ×2 IMPLANT
COVER SURGICAL LIGHT HANDLE (MISCELLANEOUS) ×2 IMPLANT
GAUZE PACKING FOLDED 2  STR (GAUZE/BANDAGES/DRESSINGS) ×1
GAUZE PACKING FOLDED 2 STR (GAUZE/BANDAGES/DRESSINGS) ×1 IMPLANT
GLOVE BIO SURGEON STRL SZ 6.5 (GLOVE) ×2 IMPLANT
GLOVE BIO SURGEON STRL SZ7 (GLOVE) IMPLANT
GLOVE BIO SURGEON STRL SZ7.5 (GLOVE) ×2 IMPLANT
GLOVE BIOGEL PI IND STRL 6.5 (GLOVE) IMPLANT
GLOVE BIOGEL PI IND STRL 7.0 (GLOVE) IMPLANT
GLOVE BIOGEL PI INDICATOR 6.5 (GLOVE)
GLOVE BIOGEL PI INDICATOR 7.0 (GLOVE)
GOWN STRL REUS W/ TWL LRG LVL3 (GOWN DISPOSABLE) ×1 IMPLANT
GOWN STRL REUS W/ TWL XL LVL3 (GOWN DISPOSABLE) ×1 IMPLANT
GOWN STRL REUS W/TWL LRG LVL3 (GOWN DISPOSABLE) ×1
GOWN STRL REUS W/TWL XL LVL3 (GOWN DISPOSABLE) ×1
KIT BASIN OR (CUSTOM PROCEDURE TRAY) ×2 IMPLANT
KIT ROOM TURNOVER OR (KITS) ×2 IMPLANT
NEEDLE 22X1 1/2 (OR ONLY) (NEEDLE) ×4 IMPLANT
NEEDLE PRECISIONGLIDE 27X1.5 (NEEDLE) IMPLANT
NS IRRIG 1000ML POUR BTL (IV SOLUTION) ×2 IMPLANT
PAD ARMBOARD 7.5X6 YLW CONV (MISCELLANEOUS) ×2 IMPLANT
SPONGE SURGIFOAM ABS GEL 12-7 (HEMOSTASIS) IMPLANT
SUT CHROMIC 3 0 PS 2 (SUTURE) ×2 IMPLANT
TOWEL GREEN STERILE (TOWEL DISPOSABLE) IMPLANT
TRAY ENT MC OR (CUSTOM PROCEDURE TRAY) ×2 IMPLANT
TUBING IRRIGATION (MISCELLANEOUS) ×2 IMPLANT
YANKAUER SUCT BULB TIP NO VENT (SUCTIONS) ×2 IMPLANT

## 2017-04-05 NOTE — Transfer of Care (Signed)
Immediate Anesthesia Transfer of Care Note  Patient: Susan Peck  Procedure(s) Performed: Procedure(s): DENTAL EXTRACTIONS OF TEETH FIVE, SEVEN, SIXTEEN, SEVENTEEN (N/A)  Patient Location: PACU  Anesthesia Type:General  Level of Consciousness: drowsy and patient cooperative  Airway & Oxygen Therapy: Patient Spontanous Breathing and Patient connected to nasal cannula oxygen  Post-op Assessment: Report given to RN  Post vital signs: Reviewed and stable  Last Vitals:  Vitals:   04/05/17 1026 04/05/17 1322  BP: (!) 164/86 (!) 151/89  Pulse: 71 68  Resp: 20 15  Temp: 36.6 C 36.6 C  SpO2: 95% 97%    Last Pain:  Vitals:   04/05/17 1026  TempSrc: Oral      Patients Stated Pain Goal: 3 (76/39/43 2003)  Complications: No apparent anesthesia complications

## 2017-04-05 NOTE — Op Note (Signed)
04/05/2017  1:03 PM  PATIENT:  Susan Peck  51 y.o. female  PRE-OPERATIVE DIAGNOSIS:  NON RESTORABLE TEETH 5, 7, 16, 17  POST-OPERATIVE DIAGNOSIS:  SAME  PROCEDURE:  Procedure(s): DENTAL EXTRACTIONS OF TEETH FIVE, SEVEN, SIXTEEN, SEVENTEEN  SURGEON:  Surgeon(s): Diona Browner, DDS  ANESTHESIA:   local and general  EBL:  minimal  DRAINS: none   SPECIMEN:  No Specimen  COUNTS:  YES  PLAN OF CARE: Discharge to home after PACU  PATIENT DISPOSITION:  PACU - hemodynamically stable.   PROCEDURE DETAILS: Dictation # [id number]  Gae Bon, DMD 04/05/2017 1:03 PM

## 2017-04-05 NOTE — H&P (Signed)
H&P documentation  -History and Physical Reviewed  -Patient has been re-examined  -No change in the plan of care  Susan Peck  

## 2017-04-05 NOTE — Anesthesia Postprocedure Evaluation (Signed)
Anesthesia Post Note  Patient: Susan Peck  Procedure(s) Performed: Procedure(s) (LRB): DENTAL EXTRACTIONS OF TEETH FIVE, SEVEN, SIXTEEN, SEVENTEEN (N/A)     Patient location during evaluation: PACU Anesthesia Type: General Level of consciousness: awake and alert Pain management: pain level controlled Vital Signs Assessment: post-procedure vital signs reviewed and stable Respiratory status: spontaneous breathing, nonlabored ventilation, respiratory function stable and patient connected to nasal cannula oxygen Cardiovascular status: blood pressure returned to baseline and stable Postop Assessment: no apparent nausea or vomiting Anesthetic complications: no    Last Vitals:  Vitals:   04/05/17 1445 04/05/17 1502  BP: (!) 156/83 (!) 160/66  Pulse: 71   Resp: 16   Temp: (!) 36.1 C   SpO2: 94% 95%    Last Pain:  Vitals:   04/05/17 1502  TempSrc:   PainSc: 0-No pain                 Indiya Izquierdo P Kiyomi Pallo

## 2017-04-05 NOTE — Anesthesia Procedure Notes (Signed)
Procedure Name: Intubation Date/Time: 04/05/2017 12:36 PM Performed by: Barrington Ellison Pre-anesthesia Checklist: Patient identified, Emergency Drugs available, Suction available and Patient being monitored Patient Re-evaluated:Patient Re-evaluated prior to induction Oxygen Delivery Method: Circle System Utilized Preoxygenation: Pre-oxygenation with 100% oxygen Induction Type: IV induction Ventilation: Mask ventilation without difficulty Laryngoscope Size: Glidescope Grade View: Grade I Nasal Tubes: Left, Nasal Rae and Magill forceps- large, utilized Tube size: 7.0 mm Number of attempts: 1 Airway Equipment and Method: Stylet and Oral airway Placement Confirmation: ETT inserted through vocal cords under direct vision,  positive ETCO2 and breath sounds checked- equal and bilateral Secured at: 25 cm Tube secured with: Tape Dental Injury: Teeth and Oropharynx as per pre-operative assessment

## 2017-04-05 NOTE — Anesthesia Preprocedure Evaluation (Addendum)
Anesthesia Evaluation  Patient identified by MRN, date of birth, ID band Patient awake    Reviewed: Allergy & Precautions, H&P , NPO status , Patient's Chart, lab work & pertinent test results, reviewed documented beta blocker date and time   Airway Mallampati: III  TM Distance: >3 FB Neck ROM: full    Dental  (+) Partial Upper, Missing,    Pulmonary shortness of breath and with exertion, asthma , sleep apnea and Continuous Positive Airway Pressure Ventilation ,    Pulmonary exam normal breath sounds clear to auscultation       Cardiovascular hypertension, Pt. on medications and Pt. on home beta blockers +CHF  + Cardiac Defibrillator (Metronic) + Valvular Problems/Murmurs MR and MVP  Rhythm:regular Rate:Normal  ECG: ventricular pacemaker, rate 65  Metronic AICD, last interrogated on 03/29/17  Cardiologist - Benismone - device Caryl Comes  1. Normal coronary arteries 2. Normal hemodynamics  3. LVEF 35-40% with global HK due to NICM  LV EF: 20% -   25%   Neuro/Psych PSYCHIATRIC DISORDERS Anxiety Depression negative neurological ROS     GI/Hepatic Neg liver ROS, GERD  Medicated and Controlled,IBS   Endo/Other  diabetes, Well Controlled, Type 2, Oral Hypoglycemic AgentsHypothyroidism Morbid obesity  Renal/GU negative Renal ROS     Musculoskeletal Low back pain   Abdominal (+) + obese,   Peds  Hematology negative hematology ROS (+)   Anesthesia Other Findings HLD AICD in left chest  Reproductive/Obstetrics                          Anesthesia Physical  Anesthesia Plan  ASA: III  Anesthesia Plan: General   Post-op Pain Management:    Induction: Intravenous  PONV Risk Score and Plan: 2 and Ondansetron and Dexamethasone  Airway Management Planned: Nasal ETT  Additional Equipment:   Intra-op Plan:   Post-operative Plan: Extubation in OR  Informed Consent: I have reviewed the patients  History and Physical, chart, labs and discussed the procedure including the risks, benefits and alternatives for the proposed anesthesia with the patient or authorized representative who has indicated his/her understanding and acceptance.   Dental Advisory Given and Dental advisory given  Plan Discussed with: CRNA and Surgeon  Anesthesia Plan Comments:         Anesthesia Quick Evaluation

## 2017-04-06 ENCOUNTER — Encounter (HOSPITAL_COMMUNITY): Payer: Medicare Other

## 2017-04-06 ENCOUNTER — Encounter (HOSPITAL_COMMUNITY): Payer: Self-pay | Admitting: Oral Surgery

## 2017-04-06 ENCOUNTER — Encounter (HOSPITAL_COMMUNITY)
Admission: RE | Admit: 2017-04-06 | Payer: Medicare Other | Source: Ambulatory Visit | Attending: Internal Medicine | Admitting: Internal Medicine

## 2017-04-06 NOTE — Op Note (Signed)
NAME:  Susan Peck, Susan Peck               ACCOUNT NO.:  [id number]  MEDICAL RECORD NO.:  [id number]  LOCATION:                                 FACILITY:  PHYSICIAN:  Gae Bon, M.D.  DATE OF BIRTH:  09-30-1965  DATE OF PROCEDURE:  04/05/2017 DATE OF DISCHARGE:                              OPERATIVE REPORT   PREOPERATIVE DIAGNOSIS:  Nonrestorable teeth secondary to dental caries, numbers 5, 7, 16, 17.  POSTOPERATIVE DIAGNOSIS:  Nonrestorable teeth secondary to dental caries, numbers 5, 7, 16, 17.  PROCEDURE:  Extraction of teeth numbers 5, 7, 16, 17.  SURGEON:  Gae Bon, M.D.  ANESTHESIA:  General, nasal intubation.  INDICATIONS FOR PROCEDURE:  Susan Peck is a 51 year old female who was referred to me by her general dentist for removal of multiple teeth secondary to severe dental caries.  She has significant past medical history significant for systolic heart failure, obstructive sleep apnea, shortness of breath, obesity, IBS, hypertension, GERD, diabetes, depression, CHF, asthma, automatic defibrillator implanted.  Because of the patient's severe medical history, it was recommended that the general anesthesia be used to alleviate her severe dental anxiety. Therefore, intubation was recommended for airway protection.  DESCRIPTION OF PROCEDURE:  The patient was taken to the operating room, placed on the table in supine position.  General anesthesia was administered intravenously and a nasal endotracheal tube was placed and secured.  The eyes were protected and the patient was draped for surgery.  Time-out was performed.  The posterior pharynx was suctioned. A throat pack was placed.  Lidocaine 2% with 1:100,000 epinephrine was infiltrated in the left inferior alveolar block and buccal and palatal infiltration around teeth numbers 5, 7 and 16.  A bite block was placed in the left side of the mouth and a 15 blade was used to make an incision around teeth numbers 5 and 7.   The teeth were elevated with a 301 elevator.  Dental forceps was used, but the crown fractured off tooth #5, and tooth #7 could not be removed with forceps.  Then, the flaps were extended and reflected with a periosteal elevator and bone was removed from around teeth numbers 5 and 7.  Tooth #7 was removed with the dental rongeurs and tooth #5 was removed after sectioning with the dental rongeurs.  Then, the sockets were curetted, irrigated, and closed with 3-0 chromic.  The bite block was repositioned to the right side of the mouth as was the Sweetheart retractor.  A 15 blade was used to make an incision around teeth numbers 16 and 17.  The periosteum was reflected and bone was removed around tooth #17.  The teeth were elevated with a 301 elevator and attempted to be removed with the forceps.  However, the teeth fractured, requiring sectioning of both numbers 16 and 17.  The roots were removed with a 301 elevator and a rongeur.  The sockets were then curetted, irrigated, and closed with 3-0 chromic.  The oral cavity was then irrigated and suctioned.  The throat pack was removed.  The patient was left in the care of Anesthesia for awakening, extubation, transportation to recovery room, and discharge home through Day Surgery.  ESTIMATED BLOOD LOSS:  Minimal.  COMPLICATIONS:  None.  SPECIMENS:  None.     Gae Bon, M.D.   ______________________________ Gae Bon, M.D.    SMJ/MEDQ  D:  04/05/2017  T:  04/06/2017  Job:  [id number]

## 2017-04-09 ENCOUNTER — Encounter (HOSPITAL_COMMUNITY): Payer: Medicare Other

## 2017-04-09 DIAGNOSIS — L03032 Cellulitis of left toe: Secondary | ICD-10-CM | POA: Diagnosis not present

## 2017-04-09 DIAGNOSIS — L03031 Cellulitis of right toe: Secondary | ICD-10-CM | POA: Diagnosis not present

## 2017-04-09 DIAGNOSIS — B351 Tinea unguium: Secondary | ICD-10-CM | POA: Diagnosis not present

## 2017-04-09 NOTE — Progress Notes (Addendum)
Patient ID: Susan Peck, female   DOB: 17-Jan-1966, 51 y.o.   MRN: [id number]           Reason for Appointment: F/u for Type 2 Diabetes  Referring physician: Elon Alas  History of Present Illness:          Date of diagnosis of type 2 diabetes mellitus: 2013 ?        Background history:   She does not remember the circumstances of her diagnosis She thinks she has been mostly taking metformin and Amaryl for her treatment and usually managed by her PCP Sugars were <150 about a year or so ago with this management but details of her previous A1c results are not available Periodically she has had difficulty affording medications also  Recent history:   Non-insulin hypoglycemic drugs the patient is taking are: Metformin ER 500 mg twice a day, Amaryl 2 mg daily   She has not been seen in follow-up for over a year  Her A1c is now 8.7, previously as low as 7.2  Current blood sugar patterns and problems identified:  She checks her blood sugar mostly in the mornings and for quite some time and it has been over 200  She is not taking metformin ER, she was recommended 1500 mg on the last visit but her PCP probably given her regular metformin and she got confused and did not take any.  Previously her fasting readings are excellent when she was taking her metformin   She appears to have gained weight since last year  She is also  back on drinking a lot of juice recently  Although she is trying to exercise with walking or cardiac rehabilitation she has not lost any weight  Today blood sugar is 337 after breakfast  Side effects from medications have been: None  Compliance with the medical regimen: Fair Hypoglycemia:   none  Glucose monitoring:  done 1  times a day         Glucometer: ?  Contour      Monitor not available for download  Self-care: The diet that the patient has been following is: tries to limit fried food and sweets  Meal times 2-3 per day, dinner 6-7 pm  Typical  meal intake: Breakfast is cereal or eggs                Dietician visit, most recent:never               Exercise: Cardiac rehabilitation or walking  5/7  days   Weight history:  Wt Readings from Last 3 Encounters:  04/10/17 192 lb 12.8 oz (87.5 kg)  04/05/17 197 lb (89.4 kg)  04/04/17 197 lb (89.4 kg)    Glycemic control:   Lab Results  Component Value Date   HGBA1C 8.7 04/03/2017   HGBA1C 7.3 04/10/2016   HGBA1C 7.2 (H) 12/14/2015   Lab Results  Component Value Date   MICROALBUR 1.4 11/09/2015   LDLCALC 115 (H) 11/09/2015   CREATININE 0.62 04/04/2017    Office Visit on 04/10/2017  Component Date Value Ref Range Status  . POC Glucose 04/10/2017 337* 70 - 99 mg/dl Final  Admission on 04/05/2017, Discharged on 04/05/2017  Component Date Value Ref Range Status  . Glucose-Capillary 04/05/2017 219* 65 - 99 mg/dL Final  . Glucose-Capillary 04/05/2017 199* 65 - 99 mg/dL Final  Hospital Outpatient Visit on 04/04/2017  Component Date Value Ref Range Status  . Sodium 04/04/2017 135  135 - 145  mmol/L Final  . Potassium 04/04/2017 3.8  3.5 - 5.1 mmol/L Final  . Chloride 04/04/2017 102  101 - 111 mmol/L Final  . CO2 04/04/2017 26  22 - 32 mmol/L Final  . Glucose, Bld 04/04/2017 276* 65 - 99 mg/dL Final  . BUN 04/04/2017 9  6 - 20 mg/dL Final  . Creatinine, Ser 04/04/2017 0.62  0.44 - 1.00 mg/dL Final  . Calcium 04/04/2017 9.2  8.9 - 10.3 mg/dL Final  . GFR calc non Af Amer 04/04/2017 >60  >60 mL/min Final  . GFR calc Af Amer 04/04/2017 >60  >60 mL/min Final   Comment: (NOTE) The eGFR has been calculated using the CKD EPI equation. This calculation has not been validated in all clinical situations. eGFR's persistently <60 mL/min signify possible Chronic Kidney Disease.   . Anion gap 04/04/2017 7  5 - 15 Final  . WBC 04/04/2017 6.7  4.0 - 10.5 K/uL Final  . RBC 04/04/2017 4.75  3.87 - 5.11 MIL/uL Final  . Hemoglobin 04/04/2017 13.3  12.0 - 15.0 g/dL Final  . HCT 04/04/2017  37.9  36.0 - 46.0 % Final  . MCV 04/04/2017 79.8  78.0 - 100.0 fL Final  . MCH 04/04/2017 28.0  26.0 - 34.0 pg Final  . MCHC 04/04/2017 35.1  30.0 - 36.0 g/dL Final  . RDW 04/04/2017 12.4  11.5 - 15.5 % Final  . Platelets 04/04/2017 205  150 - 400 K/uL Final  . Glucose-Capillary 04/04/2017 243* 65 - 99 mg/dL Final      Allergies as of 04/10/2017      Reactions   Shrimp [shellfish Allergy] Anaphylaxis      Medication List       Accurate as of 04/10/17  1:36 PM. Always use your most recent med list.          albuterol 108 (90 Base) MCG/ACT inhaler Commonly known as:  PROVENTIL HFA;VENTOLIN HFA Inhale 1-2 puffs into the lungs every 6 (six) hours as needed for wheezing or shortness of breath.   amoxicillin 500 MG capsule Commonly known as:  AMOXIL Take 1 capsule (500 mg total) by mouth 3 (three) times daily.   aspirin 81 MG chewable tablet Chew 81 mg by mouth daily.   atorvastatin 40 MG tablet Commonly known as:  LIPITOR Take 40 mg by mouth daily.   BAYER CONTOUR TEST test strip Generic drug:  glucose blood   carvedilol 25 MG tablet Commonly known as:  COREG Take 1 tablet (25 mg total) by mouth 2 (two) times daily with a meal.   estradiol 1 MG tablet Commonly known as:  ESTRACE Take 1 mg by mouth daily.   fluticasone 50 MCG/ACT nasal spray Commonly known as:  FLONASE Place 1 spray into both nostrils 2 (two) times daily as needed for allergies or rhinitis.   furosemide 40 MG tablet Commonly known as:  LASIX TAKE 1 TABLET (40 MG TOTAL) BY MOUTH 2 (TWO) TIMES DAILY.   glimepiride 2 MG tablet Commonly known as:  AMARYL Take 2 mg by mouth daily with breakfast.   ketoconazole 2 % cream Commonly known as:  NIZORAL Apply 1 application topically daily.   levothyroxine 50 MCG tablet Commonly known as:  SYNTHROID, LEVOTHROID Take 1 tablet (50 mcg total) by mouth daily.   loratadine 10 MG tablet Commonly known as:  CLARITIN Take 1 tablet (10 mg total) by mouth  daily.   oxyCODONE-acetaminophen 5-325 MG tablet Commonly known as:  PERCOCET Take 1-2 tablets by mouth  every 6 (six) hours as needed.   pantoprazole 40 MG tablet Commonly known as:  PROTONIX Take 1 tablet (40 mg total) by mouth daily.   PATADAY 0.2 % Soln Generic drug:  Olopatadine HCl Place 1 drop into both eyes daily.   PRESCRIPTION MEDICATION Inhale into the lungs at bedtime. CPAP   ranitidine 150 MG tablet Commonly known as:  ZANTAC Take 1 tablet (150 mg total) by mouth 2 (two) times daily.   sacubitril-valsartan 97-103 MG Commonly known as:  ENTRESTO Take 1 tablet by mouth 2 (two) times daily.   sodium chloride 0.65 % Soln nasal spray Commonly known as:  OCEAN Place 2 sprays into both nostrils daily as needed for congestion.            Discharge Care Instructions        Start     Ordered   04/10/17 0000  POCT glucose (manual entry)     04/10/17 1046      Allergies:  Allergies  Allergen Reactions  . Shrimp [Shellfish Allergy] Anaphylaxis    Past Medical History:  Diagnosis Date  . AICD (automatic cardioverter/defibrillator) present 12/13/2016  . Anxiety    no meds  . Asthma   . CHF (congestive heart failure) ([place])   . Depression    no meds  . Diabetes mellitus    recent dx 11/17/11 - started med 11/18/11 type 2  . GERD (gastroesophageal reflux disease)   . Goiter 07/27/2011   non-neoplastic goiter - fine needle aspiration - benign sees dr Dwyane Dee for  . Heart murmur    dx 2 yrs ago per pt  . Herpes genitalis in women   . Hypertension   . IBS (irritable bowel syndrome)    tx with diet per pt  . Low back pain    history  . Obesity   . Seasonal allergies   . Shortness of breath    occasional - exercise induced  . Sleep apnea    cpap broken  . Systolic heart failure    May 2011 EF 35-40%, 04/03/11 EF 25-30%, 06/27/11 EF 30-35%    Past Surgical History:  Procedure Laterality Date  . ABDOMINAL HYSTERECTOMY    . BIV ICD INSERTION CRT-D N/A  12/13/2016   Procedure: BiV ICD Insertion CRT-D;  Surgeon: Deboraha Sprang, MD;  Location: [facility];  Service: Cardiovascular;  Laterality: N/A;  . cardiac catherization  2004   [place], [place] - Dr Lynnell Jude  . CARDIAC CATHETERIZATION    . CARDIAC CATHETERIZATION N/A 09/15/2015   Procedure: Right/Left Heart Cath and Coronary Angiography;  Surgeon: Jolaine Artist, MD;  Location: [facility];  Service: Cardiovascular;  Laterality: N/A;  . COLONOSCOPY WITH PROPOFOL N/A 03/26/2015   Procedure: COLONOSCOPY WITH PROPOFOL;  Surgeon: Carol Ada, MD;  Location: [facility];  Service: Endoscopy;  Laterality: N/A;  . colonscopy    . CYSTOSCOPY  11/23/2011   Procedure: CYSTOSCOPY;  Surgeon: Jolayne Haines, MD;  Location: [facility];  Service: Gynecology;  Laterality: N/A;  . DIAGNOSTIC LAPAROSCOPY     of pelvis  . DILATION AND CURETTAGE OF UTERUS  12/2006,  10/2005   hysteroscopy surgery x 2  . INSERTION OF ICD  12/13/2016   BIV  . LAPAROSCOPIC ASSISTED VAGINAL HYSTERECTOMY  11/23/2011   Procedure: LAPAROSCOPIC ASSISTED VAGINAL HYSTERECTOMY;  Surgeon: Jolayne Haines, MD;  Location: [facility];  Service: Gynecology;  Laterality: N/A;  . NOVASURE ABLATION     10/2005  .  SALPINGOOPHORECTOMY  11/23/2011   Procedure: SALPINGO OOPHERECTOMY;  Surgeon: Jolayne Haines, MD;  Location: [facility];  Service: Gynecology;  Laterality: Bilateral;  . SVD     x 3  . TOOTH EXTRACTION N/A 04/05/2017   Procedure: DENTAL EXTRACTIONS OF TEETH FIVE, SEVEN, SIXTEEN, SEVENTEEN;  Surgeon: Diona Browner, DDS;  Location: [facility];  Service: Oral Surgery;  Laterality: N/A;  . TUBAL LIGATION    . WISDOM TOOTH EXTRACTION      Family History  Problem Relation Age of Onset  . Heart disease Maternal Aunt   . Breast cancer Mother   . Thyroid disease Mother   . Hypertension Maternal Grandmother   . Diabetes Maternal Grandmother   . Breast cancer Maternal Aunt   . Heart disease Maternal Aunt   . Diabetes Maternal Grandfather   .  Diabetes Paternal Grandmother   . Diabetes Paternal Grandfather   . Hypertension Paternal Grandfather     Social History:  reports that she has never smoked. She has never used smokeless tobacco. She reports that she does not drink alcohol or use drugs.    Review of Systems   GOITER: Her thyroid enlargement was first discovered in 2012, probably on a routine exam  Her ultrasound examination in 2012 showed multiple nodules with the largest nodule 24 mm on the right side and 11 mm on the left The largest nodule was 3.3cm in 2016  She has had no recent difficulty with swallowing   Does feel like she has less choking sensation in her neck when she is lying down on the right side; also occasional difficulty swallowing  She was started on thyroid supplementation 50 g to help limit the size of her goiter, not taking now for a week Has not had any recent follow-up   Lab Results  Component Value Date   TSH 0.97 11/09/2015   TSH 0.768 05/17/2015   TSH 1.742 02/09/2015   FREET4 0.98 11/09/2015   FREET4 1.33 (H) 05/17/2015   FREET4 1.24 07/10/2011      Lipid history: on treatment with Lipitor 40 mg followed by PCP    Lab Results  Component Value Date   CHOL 192 11/09/2015   HDL 39.50 11/09/2015   [place] 115 (H) 11/09/2015   TRIG 189.0 (H) 11/09/2015   CHOLHDL 5 11/09/2015           Hypertension: Present but mostly on medications for her CHF  Last exam for retinopathy: 01/2017  Lab Results  Component Value Date   HMDIABEYEEXA Retinopathy (A) 01/25/2017       Review of Systems  Respiratory: Positive for shortness of breath.        Better now     LABS:  Office Visit on 04/10/2017  Component Date Value Ref Range Status  . POC Glucose 04/10/2017 337* 70 - 99 mg/dl Final  Admission on 04/05/2017, Discharged on 04/05/2017  Component Date Value Ref Range Status  . Glucose-Capillary 04/05/2017 219* 65 - 99 mg/dL Final  . Glucose-Capillary 04/05/2017 199* 65 - 99 mg/dL  Final  Hospital Outpatient Visit on 04/04/2017  Component Date Value Ref Range Status  . Sodium 04/04/2017 135  135 - 145 mmol/L Final  . Potassium 04/04/2017 3.8  3.5 - 5.1 mmol/L Final  . Chloride 04/04/2017 102  101 - 111 mmol/L Final  . CO2 04/04/2017 26  22 - 32 mmol/L Final  . Glucose, Bld 04/04/2017 276* 65 - 99 mg/dL Final  . BUN 04/04/2017 9  6 -  20 mg/dL Final  . Creatinine, Ser 04/04/2017 0.62  0.44 - 1.00 mg/dL Final  . Calcium 04/04/2017 9.2  8.9 - 10.3 mg/dL Final  . GFR calc non Af Amer 04/04/2017 >60  >60 mL/min Final  . GFR calc Af Amer 04/04/2017 >60  >60 mL/min Final   Comment: (NOTE) The eGFR has been calculated using the CKD EPI equation. This calculation has not been validated in all clinical situations. eGFR's persistently <60 mL/min signify possible Chronic Kidney Disease.   . Anion gap 04/04/2017 7  5 - 15 Final  . WBC 04/04/2017 6.7  4.0 - 10.5 K/uL Final  . RBC 04/04/2017 4.75  3.87 - 5.11 MIL/uL Final  . Hemoglobin 04/04/2017 13.3  12.0 - 15.0 g/dL Final  . HCT 04/04/2017 37.9  36.0 - 46.0 % Final  . MCV 04/04/2017 79.8  78.0 - 100.0 fL Final  . MCH 04/04/2017 28.0  26.0 - 34.0 pg Final  . MCHC 04/04/2017 35.1  30.0 - 36.0 g/dL Final  . RDW 04/04/2017 12.4  11.5 - 15.5 % Final  . Platelets 04/04/2017 205  150 - 400 K/uL Final  . Glucose-Capillary 04/04/2017 243* 65 - 99 mg/dL Final    Physical Examination:  BP 118/84   Pulse 84   Ht '5\' 6"'  (1.676 m)   Wt 192 lb 12.8 oz (87.5 kg)   SpO2 97%   BMI 31.12 kg/m   Neck exam shows a very large 2. 5-3 x goiter on the right side especially felt on swallowing, left side is 2 times a less enlarged, firm, somewhat irregular bilaterally   Neck circumference = 41 cm  No peripheral edema    ASSESSMENT:  Diabetes type 2, uncontrolled    See history of present illness for detailed discussion of current diabetes management, blood sugar patterns and problems identified  Her A1c recently was 8.7 She is  only on Amaryl She does need better diabetes management and her consistent follow-up as well as improved diet as discussed above  Most likely will not have sustained controlled with Amaryl and metformin and needs to add another drug For now she is not taking metformin and since she apparently has not had any side effects from this can take this easily Also needs better blood sugar monitoring at home including after meals  MULTINODULAR goiter: She is not symptomatic Not clear if she has benefited from thyroid suppression but can continue, however needs baseline thyroid levels again before restarting levothyroxine  PLAN:     Since she does have history of cardiomyopathy she is an ideal candidate for adding a medication like Jardiance Discussed action of SGLT 2 drugs on lowering glucose by decreasing kidney absorption of glucose, benefits of weight loss and lower blood pressure, possible side effects including candidiasis and dosage regimen   Will clarify the dosage adjustment needed for Lasix when starting Jardiance, most likely can give her half daily Lasix dose since she is well compensated on current regimen  Will start her on 10 mg Jardiance first and then increase to 25 if needed  Metformin ER 1500 mg daily, to increase the dose to 4 tablets daily if tolerated after 1 week  Review home blood sugars in one month  For now she can increase her Amaryl to twice a day to help her morning sugars which are higher     Patient Instructions  Metformin ER 3 daily for 1 week then 4 daily with food  Check blood sugars on waking up 3/7  Also check blood sugars about 2 hours after a meal and do this after different meals by rotation  Recommended blood sugar levels on waking up is 90-130 and about 2 hours after meal is 130-160  Please bring your blood sugar monitor to each visit, thank you  Glimeperide 2x daily   Counseling time on subjects discussed in assessment and plan sections is  over 50% of today's 25 minute visit   Ayzia Day 04/10/2017, 1:36 PM   Note: This office note was prepared with Estate agent. Any transcriptional errors that result from this process are unintentional.  ADDENDUM:  Have checked with her cardiologist and he agrees to start Jardiance without change in the Lasix Also she is asking about like pain and will send a prescription for duloxetine 30 mg daily since she has not benefited from gabapentin before

## 2017-04-10 ENCOUNTER — Encounter: Payer: Self-pay | Admitting: Endocrinology

## 2017-04-10 ENCOUNTER — Ambulatory Visit (INDEPENDENT_AMBULATORY_CARE_PROVIDER_SITE_OTHER): Payer: Medicare Other | Admitting: Endocrinology

## 2017-04-10 VITALS — BP 118/84 | HR 84 | Ht 66.0 in | Wt 192.8 lb

## 2017-04-10 DIAGNOSIS — E042 Nontoxic multinodular goiter: Secondary | ICD-10-CM

## 2017-04-10 DIAGNOSIS — E1165 Type 2 diabetes mellitus with hyperglycemia: Secondary | ICD-10-CM | POA: Diagnosis not present

## 2017-04-10 LAB — GLUCOSE, POCT (MANUAL RESULT ENTRY): POC Glucose: 337 mg/dl — AB (ref 70–99)

## 2017-04-10 NOTE — Patient Instructions (Addendum)
Metformin ER 3 daily for 1 week then 4 daily with food  Check blood sugars on waking up 3/7   Also check blood sugars about 2 hours after a meal and do this after different meals by rotation  Recommended blood sugar levels on waking up is 90-130 and about 2 hours after meal is 130-160  Please bring your blood sugar monitor to each visit, thank you  Glimeperide 2x daily

## 2017-04-11 ENCOUNTER — Telehealth: Payer: Self-pay | Admitting: Endocrinology

## 2017-04-11 ENCOUNTER — Encounter (HOSPITAL_COMMUNITY): Payer: Medicare Other

## 2017-04-11 ENCOUNTER — Encounter (HOSPITAL_COMMUNITY)
Admission: RE | Admit: 2017-04-11 | Payer: Medicare Other | Source: Ambulatory Visit | Attending: Internal Medicine | Admitting: Internal Medicine

## 2017-04-11 NOTE — Telephone Encounter (Signed)
Patient has pain in both legs and forgot to  Mention yesterday. Would like a call back to discuss.  Ty, -LL

## 2017-04-13 ENCOUNTER — Encounter (HOSPITAL_COMMUNITY): Payer: Medicare Other

## 2017-04-13 MED ORDER — DULOXETINE HCL 30 MG PO CPEP: 30.0000 mg | ORAL_CAPSULE | Freq: Every day | ORAL | 3 refills | Status: DC

## 2017-04-13 MED ORDER — EMPAGLIFLOZIN 10 MG PO TABS: 10.0000 mg | ORAL_TABLET | Freq: Every day | ORAL | 3 refills | Status: DC

## 2017-04-13 NOTE — Telephone Encounter (Signed)
Have checked with her cardiologist and he agrees to starting Jardiance without change in the Lasix Have sent prescription for this to take in the morning once a day  Also for her leg pain will send a prescription for duloxetine 30 mg daily since she has not benefited from gabapentin before

## 2017-04-13 NOTE — Addendum Note (Signed)
Addended by: Elayne Snare on: 04/13/2017 12:43 PM   Modules accepted: Orders

## 2017-04-16 ENCOUNTER — Encounter (HOSPITAL_COMMUNITY): Payer: Medicare Other

## 2017-04-17 ENCOUNTER — Other Ambulatory Visit: Payer: Self-pay

## 2017-04-17 ENCOUNTER — Other Ambulatory Visit: Payer: Self-pay | Admitting: *Deleted

## 2017-04-17 ENCOUNTER — Encounter: Payer: Self-pay | Admitting: Internal Medicine

## 2017-04-17 ENCOUNTER — Ambulatory Visit (HOSPITAL_COMMUNITY): Payer: Medicare Other | Attending: Internal Medicine

## 2017-04-17 DIAGNOSIS — I5022 Chronic systolic (congestive) heart failure: Secondary | ICD-10-CM

## 2017-04-17 DIAGNOSIS — R1013 Epigastric pain: Secondary | ICD-10-CM

## 2017-04-17 DIAGNOSIS — E119 Type 2 diabetes mellitus without complications: Secondary | ICD-10-CM | POA: Diagnosis not present

## 2017-04-17 DIAGNOSIS — I447 Left bundle-branch block, unspecified: Secondary | ICD-10-CM | POA: Insufficient documentation

## 2017-04-17 DIAGNOSIS — I509 Heart failure, unspecified: Secondary | ICD-10-CM | POA: Diagnosis not present

## 2017-04-17 DIAGNOSIS — I429 Cardiomyopathy, unspecified: Secondary | ICD-10-CM | POA: Diagnosis not present

## 2017-04-17 DIAGNOSIS — E785 Hyperlipidemia, unspecified: Secondary | ICD-10-CM | POA: Diagnosis not present

## 2017-04-17 DIAGNOSIS — G4733 Obstructive sleep apnea (adult) (pediatric): Secondary | ICD-10-CM | POA: Diagnosis not present

## 2017-04-17 DIAGNOSIS — I110 Hypertensive heart disease with heart failure: Secondary | ICD-10-CM | POA: Insufficient documentation

## 2017-04-17 MED ORDER — PANTOPRAZOLE SODIUM 40 MG PO TBEC: 40.0000 mg | DELAYED_RELEASE_TABLET | Freq: Every day | ORAL | 0 refills | Status: DC

## 2017-04-17 NOTE — Telephone Encounter (Signed)
Called patient and left a voice message to let her know of the note from Dr. Dwyane Dee and to call back to let us know if she has picked up her prescriptions (2 of them) and if she is having improvement or if anything has changed.

## 2017-04-17 NOTE — Telephone Encounter (Signed)
It appears Dr. Marlowe Sax started pantoprazole for epigastric pain in August.  She has a follow-up appointment scheduled with Dr. Marlowe Sax on 06/05/2017.  I rewrote the pantoprazole (60 day supply with no refills) to get her to that appointment where Dr. Marlowe Sax can assess the efficacy of this therapy for her epigastric pain and determine if it should be continued or discontinued.

## 2017-04-18 ENCOUNTER — Encounter (HOSPITAL_COMMUNITY): Payer: Medicare Other

## 2017-04-18 ENCOUNTER — Encounter (HOSPITAL_COMMUNITY)
Admission: RE | Admit: 2017-04-18 | Discharge: 2017-04-18 | Disposition: A | Discharge status: Home / Self Care | Payer: Medicare Other | Source: Ambulatory Visit | Attending: Internal Medicine | Admitting: Internal Medicine

## 2017-04-18 ENCOUNTER — Encounter (HOSPITAL_COMMUNITY): Payer: Self-pay

## 2017-04-18 DIAGNOSIS — I5022 Chronic systolic (congestive) heart failure: Secondary | ICD-10-CM | POA: Diagnosis not present

## 2017-04-18 DIAGNOSIS — F419 Anxiety disorder, unspecified: Secondary | ICD-10-CM | POA: Diagnosis not present

## 2017-04-18 DIAGNOSIS — K219 Gastro-esophageal reflux disease without esophagitis: Secondary | ICD-10-CM | POA: Diagnosis not present

## 2017-04-18 DIAGNOSIS — J45909 Unspecified asthma, uncomplicated: Secondary | ICD-10-CM | POA: Diagnosis not present

## 2017-04-18 DIAGNOSIS — Z95810 Presence of automatic (implantable) cardiac defibrillator: Secondary | ICD-10-CM | POA: Diagnosis not present

## 2017-04-18 DIAGNOSIS — I110 Hypertensive heart disease with heart failure: Secondary | ICD-10-CM | POA: Diagnosis not present

## 2017-04-18 LAB — GLUCOSE, CAPILLARY: GLUCOSE-CAPILLARY: 268 mg/dL — AB (ref 65–99)

## 2017-04-19 ENCOUNTER — Encounter: Payer: Self-pay | Admitting: Endocrinology

## 2017-04-20 ENCOUNTER — Encounter (HOSPITAL_COMMUNITY): Payer: Medicare Other

## 2017-04-23 ENCOUNTER — Other Ambulatory Visit: Payer: Self-pay

## 2017-04-23 ENCOUNTER — Encounter (HOSPITAL_COMMUNITY): Payer: Medicare Other

## 2017-04-23 ENCOUNTER — Telehealth: Payer: Self-pay

## 2017-04-23 MED ORDER — EMPAGLIFLOZIN 10 MG PO TABS: 10.0000 mg | ORAL_TABLET | Freq: Every day | ORAL | 3 refills | Status: DC

## 2017-04-23 NOTE — Telephone Encounter (Signed)
Pt is aware and agreeable to improved EF from previous study. She was very appreciative for the call and excited about her improvement.

## 2017-04-25 ENCOUNTER — Encounter (HOSPITAL_COMMUNITY): Payer: Medicare Other

## 2017-04-27 ENCOUNTER — Other Ambulatory Visit: Payer: Self-pay | Admitting: Endocrinology

## 2017-04-27 ENCOUNTER — Encounter (HOSPITAL_COMMUNITY): Payer: Medicare Other

## 2017-04-27 ENCOUNTER — Encounter: Payer: Self-pay | Admitting: Dietician

## 2017-04-27 ENCOUNTER — Encounter: Payer: Medicare Other | Attending: Endocrinology | Admitting: Dietician

## 2017-04-27 DIAGNOSIS — E1165 Type 2 diabetes mellitus with hyperglycemia: Secondary | ICD-10-CM

## 2017-04-27 DIAGNOSIS — K219 Gastro-esophageal reflux disease without esophagitis: Secondary | ICD-10-CM | POA: Diagnosis not present

## 2017-04-27 DIAGNOSIS — E119 Type 2 diabetes mellitus without complications: Secondary | ICD-10-CM | POA: Insufficient documentation

## 2017-04-27 DIAGNOSIS — Z713 Dietary counseling and surveillance: Secondary | ICD-10-CM | POA: Insufficient documentation

## 2017-04-27 DIAGNOSIS — I110 Hypertensive heart disease with heart failure: Secondary | ICD-10-CM | POA: Insufficient documentation

## 2017-04-27 DIAGNOSIS — I509 Heart failure, unspecified: Secondary | ICD-10-CM | POA: Insufficient documentation

## 2017-04-27 DIAGNOSIS — K589 Irritable bowel syndrome without diarrhea: Secondary | ICD-10-CM | POA: Insufficient documentation

## 2017-04-27 DIAGNOSIS — G4733 Obstructive sleep apnea (adult) (pediatric): Secondary | ICD-10-CM | POA: Diagnosis not present

## 2017-04-27 NOTE — Patient Instructions (Addendum)
Patient Instructions  Get C-pap fixed Stay active.  Aim for 30 minutes 5 days per week.  (walk the track)  Consider Cardiac Rehab or [facility] or other. Read labels for carbohydrate, sodium, fat. Change what you drink to beverages without sugar. Small amounts of protein with each meal and snack. Increase your non starchy vegetables. Be consistent with taking your medication  Aim for 2-3 Carb Choices per meal (30-45 grams) +/- 1 either way  Aim for 0-1 Carbs per snack if hungry

## 2017-04-27 NOTE — Progress Notes (Signed)
Diabetes Self-Management Education  Visit Type: Follow-up  Appt. Start Time: 1445 Appt. End Time: 1600  04/27/2017  [name], identified by name and date of birth, is a 51 y.o. female with a diagnosis of Diabetes:  Type 2 Diabetes  . Hx includes goiter.  She states that she has problems swallowing pills and has to push the big ones down her throat before she swallows with liquid.  She tires of taking her medication because of this and skips them at times.  She noted that taking her medication is important to reduce her A1C.  Other hx includes GERD, IBS, HTN, CHF.  Her last EF was 40-45% which was improved.  She has OSA but her C-pap is broken and therefore does not use.  She was going to Cardiac Rehab but the term for insurance to pay for this has ended.  She used to walk around the track and plans to resume this.  She states today that she is having problems with nausea.  She seems discouraged and challenges with motivation. Medications include Jardiance (started 2 days ago), glimipuride, and Metformin ER as well as lasix.  Patient lives alone or with boyfriend.  She receives disability. She is a patient of Dr. Dwyane Dee. She uses no added salt but her diet continues to be relatively high in sodium. She is drinking sugary drinks eating ramen noodles and lacks balance with her meals.  She states that she has no problems affording food.  ASSESSMENT  Height _0  (1.626 m), weight 192 lb (87.1 kg). Body mass index is 32.96 kg/m.      Diabetes Self-Management Education - 04/27/17 1506      Visit Information   Visit Type Follow-up     Health Coping   How would you rate your overall health? Fair     Psychosocial Assessment   Patient Belief/Attitude about Diabetes Defeat/Burnout   Self-care barriers None   Self-management support Doctor's office   Other persons present Patient   Patient Concerns Nutrition/Meal planning;Glycemic Control;Weight Control   Special Needs None   Preferred Learning Style No preference indicated   Learning Readiness Ready   How often do you need to have someone help you when you read instructions, pamphlets, or other written materials from your doctor or pharmacy? 1 - Never   What is the last grade level you completed in school? 3 years college     Pre-Education Assessment   Patient understands the diabetes disease and treatment process. Needs Review   Patient understands incorporating nutritional management into lifestyle. Needs Review   Patient undertands incorporating physical activity into lifestyle. Needs Review   Patient understands monitoring blood glucose, interpreting and using results Needs Review   Patient understands prevention, detection, and treatment of acute complications. Needs Review   Patient understands prevention, detection, and treatment of chronic complications. Needs Review   Patient understands how to develop strategies to address psychosocial issues. Needs Review   Patient understands how to develop strategies to promote health/change behavior. Needs Review     Complications   Last HgB A1C per patient/outside source 8.7 %  04/10/17 increased from 7.3% one year ago   How often do you check your blood sugar? 1-2 times/day   Fasting Blood glucose range (mg/dL) >200   Postprandial Blood glucose range (mg/dL) 180-200   Number of hypoglycemic episodes per month 0   Number of hyperglycemic episodes per week 14   Have you had a dilated eye exam in the past 12  months? Yes   Have you had a dental exam in the past 12 months? Yes   Are you checking your feet? Yes   How many days per week are you checking your feet? 7     Dietary Intake   Breakfast SKIPS OR bagel and cream cheese or eggs and [place] sausage, spinach, occasional potatoes or biscuit or toast or bagel  8-10   Snack (morning) none   Lunch Ramen OR fried potatoes OR salad or sandwich or soup  12-3   Snack (afternoon) pringles or peanuts or icy   Dinner  baked chicken or fried fish and mac and cheese, mashed potatoes, vegetables OR spaghetti, toast OR hamburgers and gravy with rice  6   Snack (evening) nuts or pringles  not usually hungry   Beverage(s) juice, lemonade, punch, regular gingerale when stomach is upset,  water,  strawberry quick     Exercise   Exercise Type ADL's   How many days per week to you exercise? 0   How many minutes per day do you exercise? 0   Total minutes per week of exercise 0     Patient Education   Previous Diabetes Education Yes (please comment)  1 year ago   Nutrition management  Role of diet in the treatment of diabetes and the relationship between the three main macronutrients and blood glucose level;Food label reading, portion sizes and measuring food.;Meal options for control of blood glucose level and chronic complications.  meal planning   Physical activity and exercise  Role of exercise on diabetes management, blood pressure control and cardiac health.;Helped patient identify appropriate exercises in relation to his/her diabetes, diabetes complications and other health issue.   Medications Reviewed patients medication for diabetes, action, purpose, timing of dose and side effects.  discussed hinderances to taking them   Psychosocial adjustment Worked with patient to identify barriers to care and solutions;Identified and addressed patients feelings and concerns about diabetes   Personal strategies to promote health Lifestyle issues that need to be addressed for better diabetes care     Individualized Goals (developed by patient)   Nutrition General guidelines for healthy choices and portions discussed   Physical Activity Exercise 5-7 days per week;30 minutes per day   Medications take my medication as prescribed   Monitoring  test my blood glucose as discussed   Problem Solving taking medication, planning meals   Reducing Risk increase portions of nuts and seeds   Health Coping discuss diabetes with  (comment)  MD/RD     Post-Education Assessment   Patient understands the diabetes disease and treatment process. Demonstrates understanding / competency   Patient understands incorporating nutritional management into lifestyle. Demonstrates understanding / competency   Patient undertands incorporating physical activity into lifestyle. Demonstrates understanding / competency   Patient understands using medications safely. Demonstrates understanding / competency   Patient understands monitoring blood glucose, interpreting and using results Demonstrates understanding / competency   Patient understands prevention, detection, and treatment of acute complications. Demonstrates understanding / competency   Patient understands prevention, detection, and treatment of chronic complications. Demonstrates understanding / competency   Patient understands how to develop strategies to address psychosocial issues. Needs Review   Patient understands how to develop strategies to promote health/change behavior. Needs Review     Outcomes   Expected Outcomes Demonstrated interest in learning. Expect positive outcomes   Future DMSE PRN   Program Status Completed     Subsequent Visit   Since your last visit have  you continued or begun to take your medications as prescribed? No  problems swallowing pills at times and hates to swallow pills   Since your last visit have you had your blood pressure checked? Yes      Individualized Plan for Diabetes Self-Management Training:   Learning Objective:  Patient will have a greater understanding of diabetes self-management. Patient education plan is to attend individual and/or group sessions per assessed needs and concerns.   Plan:   Patient Instructions   Patient Instructions  Get C-pap fixed Stay active.  Aim for 30 minutes 5 days per week.  (walk the track)  Consider Card[facility] Rehab or [facility] or other. Read labels for carbohydrate, sodium, fat. Change what you  drink to beverages without sugar. Small amounts of protein with each meal and snack. Increase your non starchy vegetables. Be consistent with taking your medication  Aim for 2-3 Carb Choices per meal (30-45 grams) +/- 1 either way  Aim for 0-1 Carbs per snack if hungry       Expected Outcomes:  Demonstrated interest in learning. Expect positive outcomes  Education material provided: Food label handouts, Meal plan card, My Plate and Snack sheet,Let's count carbs, Building a balanced meal  If problems or questions, patient to contact team via:  Phone  Future DSME appointment: PRN (patient did not want to commit today but see how she will do).

## 2017-04-28 ENCOUNTER — Encounter: Payer: Self-pay | Admitting: Internal Medicine

## 2017-04-30 ENCOUNTER — Encounter (HOSPITAL_COMMUNITY): Payer: Medicare Other

## 2017-05-02 ENCOUNTER — Encounter (HOSPITAL_COMMUNITY): Payer: Medicare Other

## 2017-05-07 ENCOUNTER — Other Ambulatory Visit: Payer: Medicaid Other

## 2017-05-10 ENCOUNTER — Ambulatory Visit: Payer: Medicaid Other | Admitting: Endocrinology

## 2017-05-16 ENCOUNTER — Ambulatory Visit (INDEPENDENT_AMBULATORY_CARE_PROVIDER_SITE_OTHER): Payer: Medicare Other | Admitting: Internal Medicine

## 2017-05-16 VITALS — BP 150/83 | HR 73 | Temp 98.4°F | Wt 195.0 lb

## 2017-05-16 DIAGNOSIS — G4733 Obstructive sleep apnea (adult) (pediatric): Secondary | ICD-10-CM

## 2017-05-16 DIAGNOSIS — I428 Other cardiomyopathies: Secondary | ICD-10-CM | POA: Diagnosis not present

## 2017-05-16 DIAGNOSIS — J324 Chronic pansinusitis: Secondary | ICD-10-CM | POA: Diagnosis not present

## 2017-05-16 DIAGNOSIS — I110 Hypertensive heart disease with heart failure: Secondary | ICD-10-CM

## 2017-05-16 DIAGNOSIS — H9209 Otalgia, unspecified ear: Secondary | ICD-10-CM | POA: Diagnosis not present

## 2017-05-16 DIAGNOSIS — Z79899 Other long term (current) drug therapy: Secondary | ICD-10-CM

## 2017-05-16 DIAGNOSIS — Z7982 Long term (current) use of aspirin: Secondary | ICD-10-CM | POA: Diagnosis not present

## 2017-05-16 DIAGNOSIS — I10 Essential (primary) hypertension: Secondary | ICD-10-CM

## 2017-05-16 DIAGNOSIS — I5022 Chronic systolic (congestive) heart failure: Secondary | ICD-10-CM

## 2017-05-16 MED ORDER — SALINE SPRAY 0.65 % NA SOLN: 2.0000 | Freq: Every day | NASAL | 1 refills | Status: DC | PRN

## 2017-05-16 MED ORDER — BENZONATATE 100 MG PO CAPS: 100.0000 mg | ORAL_CAPSULE | Freq: Four times a day (QID) | ORAL | 1 refills | Status: AC | PRN

## 2017-05-16 NOTE — Patient Instructions (Addendum)
It was a pleasure to see you today [name].   You likely have a viral upper respiratory infection -please take warm showers and allow the mist to help your symptoms -use nasal spray  -tessalon pears for your cough -drink plenty of fluids  -do not use decongestants especially with pseudoephedrine in it as it will increase your blood pressure  -follow up in 4 weeks for blood pressure follow up  If you have any questions or concerns, please call our clinic at [phone] between 9am-5pm and after hours call [phone] and ask for the internal medicine resident on call. If you feel you are having a medical emergency please call 911.   Thank you, we look forward to help you remain healthy!  Susan Mage, MD Internal Medicine PGY1

## 2017-05-16 NOTE — Assessment & Plan Note (Addendum)
The patient's blood pressure during this visit was 150/83. She has been acutely ill for the past week and has been taking over the counter cough syrup. The patient's blood pressure is possibly elevated due to dextromethorphan containing cough syrup.   -Stop using decongestants and cough syrup with pseudoephedrine in it  -Continue coreg 25mg  and entresto 97-203 bid.

## 2017-05-16 NOTE — Progress Notes (Signed)
   CC: Upper respiratory symptoms  HPI:  [name] is a 51 y.o. f with pmh with pmh of chronic systolic heart failure, htn, osa, nonischemic cardiomyopathy, chronic pan sinusitis who presents with upper respiratory symptoms. Please see assessment and plan for additional information.  Past Medical History:  Diagnosis Date  . AICD (automatic cardioverter/defibrillator) present 12/13/2016  . Anxiety    no meds  . Asthma   . CHF (congestive heart failure) ([place])   . Depression    no meds  . Diabetes mellitus    recent dx 11/17/11 - started med 11/18/11 type 2  . GERD (gastroesophageal reflux disease)   . Goiter 07/27/2011   non-neoplastic goiter - fine needle aspiration - benign sees dr Dwyane Dee for  . Heart murmur    dx 2 yrs ago per pt  . Herpes genitalis in women   . Hypertension   . IBS (irritable bowel syndrome)    tx with diet per pt  . Low back pain    history  . Obesity   . Seasonal allergies   . Shortness of breath    occasional - exercise induced  . Sleep apnea    cpap broken  . Systolic heart failure    May 2011 EF 35-40%, 04/03/11 EF 25-30%, 06/27/11 EF 30-35%   Review of Systems:  Per hpi  Physical Exam:  Vitals:   05/16/17 1103  BP: (!) 150/83  Pulse: 73  Temp: 98.4 F (36.9 C)  TempSrc: Oral  SpO2: 100%  Weight: 195 lb (88.5 kg)   Physical Exam  Constitutional: She appears well-developed and well-nourished. No distress.  HENT:  Head: Normocephalic and atraumatic.  Right Ear: Hearing, tympanic membrane, external ear and ear canal normal.  Left Ear: Hearing, tympanic membrane, external ear and ear canal normal.  Mouth/Throat: No oropharyngeal exudate, posterior oropharyngeal edema, posterior oropharyngeal erythema or tonsillar abscesses.  Cardiovascular: Normal rate, regular rhythm and normal heart sounds.   Pulmonary/Chest: Effort normal and breath sounds normal. No respiratory distress. She has no wheezes.  Abdominal: Soft. Bowel sounds are  normal. She exhibits no distension. There is no tenderness.  Psychiatric: She has a normal mood and affect. Her behavior is normal. Judgment and thought content normal.     Assessment & Plan:   See Encounters Tab for problem based charting.  Patient seen with Dr. Dareen Piano

## 2017-05-16 NOTE — Assessment & Plan Note (Addendum)
The patient presents with productive cough with sputum is yellow in color for the past 1 week. She has accompanied sore throat, ear pain, hoarseness in the voice, and postnasal drip. Denies headaches, sinus tenderness, fever, shortness of breath, abdominal pain, nausea, or vomiting. She has been around friends children who has been sick.   The patient likely has a viral upper respiratory infection.   -use ocean nasal spray -tessalon pears for cough  -recommend drinking plenty of fluids -suggested not using decongestants especially containing pseudoephedrine

## 2017-05-18 NOTE — Progress Notes (Signed)
Internal Medicine Clinic Attending  I saw and evaluated the patient.  I personally confirmed the key portions of the history and exam documented by Dr. Chundi and I reviewed pertinent patient test results.  The assessment, diagnosis, and plan were formulated together and I agree with the documentation in the resident's note. 

## 2017-05-22 ENCOUNTER — Ambulatory Visit (INDEPENDENT_AMBULATORY_CARE_PROVIDER_SITE_OTHER): Payer: Medicare Other | Admitting: Women's Health

## 2017-05-22 VITALS — BP 148/80

## 2017-05-22 DIAGNOSIS — N898 Other specified noninflammatory disorders of vagina: Secondary | ICD-10-CM

## 2017-05-22 DIAGNOSIS — N952 Postmenopausal atrophic vaginitis: Secondary | ICD-10-CM | POA: Diagnosis not present

## 2017-05-22 DIAGNOSIS — B3731 Acute candidiasis of vulva and vagina: Secondary | ICD-10-CM

## 2017-05-22 DIAGNOSIS — B373 Candidiasis of vulva and vagina: Secondary | ICD-10-CM

## 2017-05-22 LAB — WET PREP FOR TRICH, YEAST, CLUE

## 2017-05-22 MED ORDER — ESTRADIOL 10 MCG VA TABS: ORAL_TABLET | VAGINAL | 11 refills | Status: DC

## 2017-05-22 MED ORDER — TERCONAZOLE 0.4 % VA CREA: 1.0000 | TOPICAL_CREAM | Freq: Every day | VAGINAL | 0 refills | Status: DC

## 2017-05-22 NOTE — Progress Notes (Signed)
51 year old MBF G7 P3 Presents with complaint of vaginal dryness, irritation, external itching and painful intercourse. Symptoms have increased over the last month. Postmenopausal for years on no HRT with no bleeding. Same partner. Denies vaginal discharge, urinary symptoms other than occasional burning at initiation of urination. Denies abdominal pain, nausea or fever. Medical history includes heart failure, hypertension and IBS. On disability due to medical problems.  Exam: Appears well. External genitalia extremely erythematous over entire labias, erythematous at introitus, speculum exam vaginal walls mild erythema, atrophic, no visible discharge, wet prep negative.   Vaginal atrophy Clinical yeast vaginitis  Plan: Terazol 7 apply small amount externally at  bedtime, yeast prevention discussed. Loose clothing, open to air as able. Vaginal atrophy discussed options, will try Vagifem one applicator at bedtime for 2 weeks vaginally and then twice weekly thereafter. Instructed to call if no relief. Continue over-the-counter vaginal lubricants as needed.

## 2017-05-22 NOTE — Patient Instructions (Signed)
Atrophic Vaginitis Atrophic vaginitis is when the tissues that line the vagina become dry and thin. This is caused by a drop in estrogen. Estrogen helps:  To keep the vagina moist.  To make a clear fluid that helps: ? To lubricate the vagina for sex. ? To protect the vagina from infection.  If the lining of the vagina is dry and thin, it may:  Make sex painful. It may also cause bleeding.  Cause a feeling of: ? Burning. ? Irritation. ? Itchiness.  Make an exam of your vagina painful. It may also cause bleeding.  Make you lose interest in sex.  Cause a burning feeling when you pee.  Make your vaginal fluid (discharge) brown or yellow.  For some women, there are no symptoms. This condition is most common in women who do not get their regular menstrual periods anymore (menopause). This often starts when a woman is 45-55 years old. Follow these instructions at home:  Take medicines only as told by your doctor. Do not use any herbal or alternative medicines unless your doctor says it is okay.  Use over-the-counter products for dryness only as told by your doctor. These include: ? Creams. ? Lubricants. ? Moisturizers.  Do not douche.  Do not use products that can make your vagina dry. These include: ? Scented feminine sprays. ? Scented tampons. ? Scented soaps.  If it hurts to have sex, tell your sexual partner. Contact a doctor if:  Your discharge looks different than normal.  Your vagina has an unusual smell.  You have new symptoms.  Your symptoms do not get better with treatment.  Your symptoms get worse. This information is not intended to replace advice given to you by your health care provider. Make sure you discuss any questions you have with your health care provider. Document Released: 12/27/2007 Document Revised: 12/16/2015 Document Reviewed: 07/01/2014 Elsevier Interactive Patient Education  2018 [employer]  

## 2017-05-29 NOTE — Addendum Note (Signed)
Encounter addended by: Lowell Guitar, RN on: 05/29/2017 1:40 PM  Actions taken: Sign clinical note

## 2017-05-29 NOTE — Progress Notes (Signed)
Discharge Progress Report  Patient Details  Name: Susan Peck MRN: [id number] Date of Birth: 1965/09/17 Referring Provider:     CARDIAC REHAB PHASE II ORIENTATION from 01/23/2017 in [place]  Referring Provider  Glori Bickers MD       Number of Visits: 26   Reason for Discharge:  Patient reached a stable level of exercise.  Smoking History:  Social History   Tobacco Use  Smoking Status Never Smoker  Smokeless Tobacco Never Used    Diagnosis:  Chronic systolic congestive heart failure ([place])  ADL UCSD:   Initial Exercise Prescription: Initial Exercise Prescription - 01/23/17 1100      Date of Initial Exercise RX and Referring Provider   Date  01/23/17    Referring Provider  Glori Bickers MD      Treadmill   MPH  2.5    Grade  1    Minutes  10    METs  3.26      Bike   Level  0.8    Minutes  10    METs  2.78      NuStep   Level  3    SPM  80    Minutes  10    METs  2      Prescription Details   Frequency (times per week)  3    Duration  Progress to 30 minutes of continuous aerobic without signs/symptoms of physical distress      Intensity   THRR 40-80% of Max Heartrate  68-135    Ratings of Perceived Exertion  11-15    Perceived Dyspnea  0-4      Progression   Progression  Continue to progress workloads to maintain intensity without signs/symptoms of physical distress.      Resistance Training   Training Prescription  Yes    Weight  3lbs    Reps  10-15       Discharge Exercise Prescription (Final Exercise Prescription Changes): Exercise Prescription Changes - 04/03/17 1600      Response to Exercise   Blood Pressure (Admit)  96/60 asymptomatic   asymptomatic   Blood Pressure (Exercise)  124/70    Blood Pressure (Exit)  100/60    Heart Rate (Admit)  83 bpm    Heart Rate (Exercise)  91 bpm    Heart Rate (Exit)  68 bpm    Rating of Perceived Exertion (Exercise)  13    Symptoms  none    Duration   Continue with 30 min of aerobic exercise without signs/symptoms of physical distress.    Intensity  THRR unchanged      Progression   Progression  Continue to progress workloads to maintain intensity without signs/symptoms of physical distress.    Average METs  3.4      Resistance Training   Training Prescription  Yes    Weight  3lbs    Reps  10-15    Time  10 Minutes      Treadmill   MPH  3    Grade  2    Minutes  10    METs  4.12      Bike   Level  0.8    Minutes  10    METs  2.72      NuStep   Level  4    SPM  85    Minutes  10    METs  2.7      Home Exercise  Plan   Plans to continue exercise at  [facility] (comment) walking at [place] [facility]   walking at [place] [facility]   Frequency  Add 2 additional days to program exercise sessions.    Initial Home Exercises Provided  02/12/17       Functional Capacity: 6 Minute Walk    Row Name 01/23/17 0913 01/23/17 1140       6 Minute Walk   Phase  Initial  -    Distance  1770 feet  -    Walk Time  6 minutes  -    # of Rest Breaks  0  -    MPH  -  3.35    METS  -  4.26    RPE  12  -    VO2 Peak  -  14.9    Symptoms  Yes (comment)  -    Comments  fatigue! Lower leg tightness(calf region)  -    Resting HR  78 bpm  -    Resting BP  108/60  -    Max Ex. HR  92 bpm  -    Max Ex. BP  118/64  -    2 Minute Post BP  -  108/72       Psychological, QOL, Others - Outcomes: PHQ 2/9: Depression screen [facility] 2/9 05/16/2017 04/27/2017 03/12/2017 01/29/2017 09/01/2016  Decreased Interest 0 0 2 3 1   Down, Depressed, Hopeless 2 2 2 3 1   PHQ - 2 Score 2 2 4 6 2   Altered sleeping 3 3 3 3 1   Tired, decreased energy 3 3 3 2  0  Change in appetite 1 1 2 1  0  Feeling bad or failure about yourself  0 0 3 1 0  Trouble concentrating 0 0 3 1 0  Moving slowly or fidgety/restless 0 0 3 1 0  Suicidal thoughts 0 0 0 0 0  PHQ-9 Score 9 9 21 15 3   Difficult doing work/chores Not difficult at all - Somewhat difficult Very difficult  Somewhat difficult  Some recent data might be hidden    Quality of Life: Quality of Life - 04/18/17 0959      Quality of Life Scores   Health/Function Pre  10.4 %  (Pended)     Health/Function Post  18.77 %  (Pended)     Health/Function % Change  80.48 %  (Pended)     Socioeconomic Pre  10.5 %  (Pended)     Socioeconomic Post  15.43 %  (Pended)     Socioeconomic % Change   46.95 %  (Pended)     Psych/Spiritual Pre  17.14 %  (Pended)     Psych/Spiritual Post  18 %  (Pended)     Psych/Spiritual % Change  5.02 %  (Pended)     Family Pre  6 %  (Pended)     Family Post  19.2 %  (Pended)     Family % Change  220 %  (Pended)     GLOBAL Pre  11.14 %  (Pended)     GLOBAL Post  17.99 %  (Pended)     GLOBAL % Change  61.49 %  (Pended)        Personal Goals: Goals established at orientation with interventions provided to work toward goal. Personal Goals and Risk Factors at Admission - 01/23/17 0933      Core Components/Risk Factors/Patient Goals on Admission   Hypertension  Yes  Intervention  Provide education on lifestyle modifcations including regular physical activity/exercise, weight management, moderate sodium restriction and increased consumption of fresh fruit, vegetables, and low fat dairy, alcohol moderation, and smoking cessation.;Monitor prescription use compliance.    Expected Outcomes  Short Term: Continued assessment and intervention until BP is < 140/57mm HG in hypertensive participants. < 130/58mm HG in hypertensive participants with diabetes, heart failure or chronic kidney disease.;Long Term: Maintenance of blood pressure at goal levels.    Stress  Yes    Intervention  Offer individual and/or small group education and counseling on adjustment to heart disease, stress management and health-related lifestyle change. Teach and support self-help strategies.;Refer participants experiencing significant psychosocial distress to appropriate mental health specialists for further  evaluation and treatment. When possible, include family members and significant others in education/counseling sessions.    Expected Outcomes  Short Term: Participant demonstrates changes in health-related behavior, relaxation and other stress management skills, ability to obtain effective social support, and compliance with psychotropic medications if prescribed.;Long Term: Emotional wellbeing is indicated by absence of clinically significant psychosocial distress or social isolation.        Personal Goals Discharge: Goals and Risk Factor Review    Row Name 02/05/17 1217 03/06/17 0814 04/04/17 1113 04/24/17 1128       Core Components/Risk Factors/Patient Goals Review   Personal Goals Review  Weight Management/Obesity;Heart Failure;Hypertension;Lipids;Diabetes;Other;Stress  Weight Management/Obesity;Heart Failure;Hypertension;Lipids;Diabetes;Other;Stress  Weight Management/Obesity;Heart Failure;Hypertension;Lipids;Diabetes;Other;Stress  Weight Management/Obesity;Heart Failure;Hypertension;Lipids;Diabetes;Other;Stress    Review  pt with multiple CAD RF demonstrates eagerness to participate in CR program.   pt with multiple CAD RF demonstrates eagerness to participate in CR program.   pt with multiple CAD RF demonstrates eagerness to participate in CR program.   pt completed 26 CR sessions. pt absences due to medical holds. pt demonstrates eagerness to modify risk factors to improve her health.     Expected Outcomes  pt will participate in CR exercise, nutrition and lifestyle modification education classes to decrease overall CAD RF.  pt will participate in CR exercise, nutrition and lifestyle modification education classes to decrease overall CAD RF.  pt will participate in CR exercise, nutrition and lifestyle modification education classes to decrease overall CAD RF.  pt will continue physical activity, nutrition and lifestyle modification to decrease overall RF.        Exercise Goals and  Review: Exercise Goals    Row Name 01/23/17 0916             Exercise Goals   Increase Physical Activity  Yes       Intervention  Provide advice, education, support and counseling about physical activity/exercise needs.;Develop an individualized exercise prescription for aerobic and resistive training based on initial evaluation findings, risk stratification, comorbidities and participant's personal goals.       Expected Outcomes  Achievement of increased cardiorespiratory fitness and enhanced flexibility, muscular endurance and strength shown through measurements of functional capacity and personal statement of participant.       Increase Strength and Stamina  Yes       Intervention  Provide advice, education, support and counseling about physical activity/exercise needs.;Develop an individualized exercise prescription for aerobic and resistive training based on initial evaluation findings, risk stratification, comorbidities and participant's personal goals.       Expected Outcomes  Achievement of increased cardiorespiratory fitness and enhanced flexibility, muscular endurance and strength shown through measurements of functional capacity and personal statement of participant.          Nutrition & Weight -  Outcomes: Pre Biometrics - 01/23/17 0932      Pre Biometrics   Waist Circumference  40 inches    Hip Circumference  42.5 inches    Waist to Hip Ratio  0.94 %    Triceps Skinfold  46 mm    Grip Strength  34 kg    Flexibility  14 in    Single Leg Stand  6.31 seconds      Post Biometrics - 01/23/17 1144       Post  Biometrics   % Body Fat  44.9 %       Nutrition: Nutrition Therapy & Goals - 02/07/17 1103      Nutrition Therapy   Diet  Carb Modified, Therapeutic Lifestyle Changes      Personal Nutrition Goals   Nutrition Goal  Improved glycemic control as evidenced by pt's A1c trending from 7.3 to less than 7    Personal Goal #2  Pt to identify food quantities necessary  to achieve weight loss of 6-24 lb (2.7-10.9 kg) at graduation from cardiac rehab. Goal wt of 165-175 lb desired.       Intervention Plan   Intervention  Prescribe, educate and counsel regarding individualized specific dietary modifications aiming towards targeted core components such as weight, hypertension, lipid management, diabetes, heart failure and other comorbidities.;Nutrition handout(s) given to patient. 1500 kcal, 5 day menu ideas; Type 2 DM Nutrition Therapy   1500 kcal, 5 day menu ideas; Type 2 DM Nutrition Therapy   Expected Outcomes  Short Term Goal: Understand basic principles of dietary content, such as calories, fat, sodium, cholesterol and nutrients.;Long Term Goal: Adherence to prescribed nutrition plan.       Nutrition Discharge:   Education Questionnaire Score: Knowledge Questionnaire Score - 04/18/17 0959      Knowledge Questionnaire Score   Post Score  24/24       Goals reviewed with patient; copy given to patient.

## 2017-06-02 ENCOUNTER — Other Ambulatory Visit (HOSPITAL_COMMUNITY): Payer: Self-pay | Admitting: Internal Medicine

## 2017-06-05 ENCOUNTER — Other Ambulatory Visit: Payer: Self-pay

## 2017-06-05 ENCOUNTER — Encounter: Payer: Self-pay | Admitting: Internal Medicine

## 2017-06-05 ENCOUNTER — Ambulatory Visit (INDEPENDENT_AMBULATORY_CARE_PROVIDER_SITE_OTHER): Payer: Medicare Other | Admitting: Internal Medicine

## 2017-06-05 VITALS — BP 149/91 | HR 88 | Temp 98.0°F | Ht 64.0 in | Wt 194.9 lb

## 2017-06-05 DIAGNOSIS — M545 Low back pain: Secondary | ICD-10-CM

## 2017-06-05 DIAGNOSIS — G4733 Obstructive sleep apnea (adult) (pediatric): Secondary | ICD-10-CM | POA: Diagnosis not present

## 2017-06-05 DIAGNOSIS — E049 Nontoxic goiter, unspecified: Secondary | ICD-10-CM

## 2017-06-05 DIAGNOSIS — I5022 Chronic systolic (congestive) heart failure: Secondary | ICD-10-CM | POA: Diagnosis not present

## 2017-06-05 DIAGNOSIS — G8929 Other chronic pain: Secondary | ICD-10-CM | POA: Diagnosis not present

## 2017-06-05 DIAGNOSIS — M25511 Pain in right shoulder: Secondary | ICD-10-CM

## 2017-06-05 DIAGNOSIS — Z0000 Encounter for general adult medical examination without abnormal findings: Secondary | ICD-10-CM | POA: Diagnosis not present

## 2017-06-05 DIAGNOSIS — I10 Essential (primary) hypertension: Secondary | ICD-10-CM | POA: Diagnosis not present

## 2017-06-05 DIAGNOSIS — E1165 Type 2 diabetes mellitus with hyperglycemia: Secondary | ICD-10-CM | POA: Diagnosis not present

## 2017-06-05 LAB — GLUCOSE, CAPILLARY: Glucose-Capillary: 252 mg/dL — ABNORMAL HIGH (ref 65–99)

## 2017-06-05 MED ORDER — DICLOFENAC SODIUM 1 % TD GEL: 2.0000 g | Freq: Four times a day (QID) | TRANSDERMAL | 1 refills | Status: DC

## 2017-06-05 NOTE — Patient Instructions (Addendum)
Ms. Seres it was nice seeing you today.  -Use Voltaren gel 4 times a day for the pain in your shoulder  -You may also take over-the-counter ibuprofen as needed.  Please follow the instructions on the bottle.  -Please take your blood pressure medications regularly.  -Return for a follow-up visit in 1 month.

## 2017-06-06 DIAGNOSIS — Z0000 Encounter for general adult medical examination without abnormal findings: Secondary | ICD-10-CM | POA: Insufficient documentation

## 2017-06-06 NOTE — Assessment & Plan Note (Signed)
Patient reports having right shoulder pain/soreness which started this morning after she woke up from her sleep.  She denies having any arm weakness, numbness, or tingling.  Her grip strength is normal.  Denies having any neck pain.  Shoulder tender to palpation on exam but no swelling or obvious deformity noted.  Range of motion normal.  Normal strength and sensation in bilateral upper extremities.  Her shoulder pain is likely musculoskeletal in nature.  Plan -Voltaren gel -Heating pad -Over-the-counter ibuprofen as needed

## 2017-06-06 NOTE — Assessment & Plan Note (Signed)
BP Readings from Last 3 Encounters:  06/05/17 (!) 149/91  05/22/17 (!) 148/80  05/16/17 (!) 150/83    Lab Results  Component Value Date   NA 135 04/04/2017   K 3.8 04/04/2017   CREATININE 0.62 04/04/2017    Assessment: Blood pressure control:  Above goal Comments: Currently on Coreg 25 mg twice daily, Entresto 97-203 mg twice daily, and Lasix 40 mg twice daily.  Patient states she forgot to take her medications this morning.  Plan: Medications:  continue current medications.  Emphasized the importance of medication compliance. Educational resources provided:   Educated patient about healthy eating and exercise. Emphasized the importance of weight loss.  Other plans: Return to clinic in 1 month for blood pressure recheck.

## 2017-06-06 NOTE — Assessment & Plan Note (Signed)
History of multinodular goiter.  Currently asymptomatic.  Followed by Dr. Dwyane Dee (endocrinology).  Plan -She needs an annual thyroid ultrasound and TSH check.  Will try to get records from endocrinology. -Encouraged her to continue going to her endocrinology appointment

## 2017-06-06 NOTE — Assessment & Plan Note (Addendum)
X-ray done in June 2017 showing minimal osteoarthritic changes of the lower lumbar sacral spine.  Patient states her back pain is being managed by a chiropractor.  She is requesting her x-ray results to be sent to their office.

## 2017-06-06 NOTE — Progress Notes (Signed)
   CC: Patient is complaining of right shoulder pain.  HPI:  [name] is a 51 y.o. female with a past medical history of conditions listed below presenting to the clinic complaining of right shoulder pain.  Hypertension, OSA, diabetes, chronic systolic heart failure and chronic back pain were also discussed. Please see problem based charting for the status of the patient's current and chronic medical conditions.   Past Medical History:  Diagnosis Date  . AICD (automatic cardioverter/defibrillator) present 12/13/2016  . Anxiety    no meds  . Asthma   . CHF (congestive heart failure) ([place])   . Depression    no meds  . Diabetes mellitus    recent dx 11/17/11 - started med 11/18/11 type 2  . GERD (gastroesophageal reflux disease)   . Goiter 07/27/2011   non-neoplastic goiter - fine needle aspiration - benign sees dr Dwyane Dee for  . Heart murmur    dx 2 yrs ago per pt  . Herpes genitalis in women   . Hypertension   . IBS (irritable bowel syndrome)    tx with diet per pt  . Low back pain    history  . Obesity   . Seasonal allergies   . Shortness of breath    occasional - exercise induced  . Sleep apnea    cpap broken  . Systolic heart failure    May 2011 EF 35-40%, 04/03/11 EF 25-30%, 06/27/11 EF 30-35%   Review of Systems: Pertinent positives mentioned in HPI. Remainder of all ROS negative.   Physical Exam:  Vitals:   06/05/17 1329  BP: (!) 149/91  Pulse: 88  Temp: 98 F (36.7 C)  TempSrc: Oral  SpO2: 98%  Weight: 194 lb 14.4 oz (88.4 kg)  Height: 5\' 4"  (1.626 m)   Physical Exam  Constitutional: She is oriented to person, place, and time. She appears well-developed and well-nourished. No distress.  HENT:  Head: Normocephalic and atraumatic.  Mouth/Throat: Oropharynx is clear and moist.  Eyes: Right eye exhibits no discharge. Left eye exhibits no discharge.  Cardiovascular: Normal rate, regular rhythm and intact distal pulses. Exam reveals no gallop and no  friction rub.  Pulmonary/Chest: Effort normal and breath sounds normal. No respiratory distress. She has no wheezes. She has no rales.  Abdominal: Soft. Bowel sounds are normal. She exhibits no distension. There is no tenderness.  Musculoskeletal:  Right shoulder: Tender to palpation.  No swelling or deformity noted.  Normal range of motion. Strength 5 out of 5 and sensation to light touch intact in bilateral upper extremities.  Neurological: She is alert and oriented to person, place, and time.  Skin: Skin is warm and dry.    Assessment & Plan:   See Encounters Tab for problem based charting.  Patient discussed with Dr. Daryll Drown

## 2017-06-06 NOTE — Progress Notes (Signed)
Internal Medicine Clinic Attending  Case discussed with Dr. Rathoreat the time of the visit. We reviewed the resident's history and exam and pertinent patient test results. I agree with the assessment, diagnosis, and plan of care documented in the resident's note.  

## 2017-06-06 NOTE — Assessment & Plan Note (Addendum)
Stable. She denies having any shortness of breath.  She had a recent viral URI and states her cough is getting better.  Denies having any orthopnea or lower extremity edema.  Appears euvolemic on exam.  Reports having a jerking sensation on the left side of her chest 3 days ago which she attributes to dysfunction of her defibrillator.  Denies having any heart palpitations, chest pain, or shortness of breath.  States she had experienced similar symptoms in the past and they had to make adjustments to her defibrillator at that time.  Pulse 88 at this visit.  Current medications include Coreg 25 mg twice daily, Entresto 97-203 mg twice daily, and Lasix 40 mg twice daily.  She is being followed by cardiology.  Plan -Continue current management -She has an appointment for defibrillator check on June 27, 2017.  Encouraged her to follow-up with cardiology sooner if she experiences a similar episode again.

## 2017-06-06 NOTE — Assessment & Plan Note (Signed)
History of moderate OSA.  She is followed by [facility] pulmonology.  Patient states her old machine is not working anymore and she will contact pulmonology to request a new one.

## 2017-06-06 NOTE — Assessment & Plan Note (Signed)
Patient declined Tdap vaccine at this visit.

## 2017-06-06 NOTE — Assessment & Plan Note (Signed)
Last A1c 8.7 in September 2018.  He is being followed by Dr. Dwyane Dee (endocrinology).  Currently on Metformin, glimepiride, and Jardiance.  Postprandial CBG 252 at this visit.  Plan -Encouraged her to go to her next endocrinology appointment

## 2017-06-11 ENCOUNTER — Telehealth: Payer: Self-pay | Admitting: Internal Medicine

## 2017-06-11 NOTE — Telephone Encounter (Signed)
Called pt after seeing that her OV that is scheduled tomorrow with MR at 10:30 is for CPAP maintenance.   Left message for pt to call us so we can verify the reason for her OV that is scheduled with MR due to MR not being a sleep doctor and wondering if we need to put pt with a different provider.  Will call pt back tomorrow morning, 06/12/17 if pt has not called [place] back before then.

## 2017-06-12 ENCOUNTER — Encounter: Payer: Self-pay | Admitting: Adult Health

## 2017-06-12 ENCOUNTER — Telehealth: Payer: Self-pay | Admitting: *Deleted

## 2017-06-12 ENCOUNTER — Ambulatory Visit (INDEPENDENT_AMBULATORY_CARE_PROVIDER_SITE_OTHER): Payer: Medicare Other | Admitting: Adult Health

## 2017-06-12 ENCOUNTER — Ambulatory Visit: Payer: Medicare Other | Admitting: Internal Medicine

## 2017-06-12 VITALS — BP 138/84 | HR 83 | Ht 64.0 in | Wt 196.2 lb

## 2017-06-12 DIAGNOSIS — G4733 Obstructive sleep apnea (adult) (pediatric): Secondary | ICD-10-CM | POA: Diagnosis not present

## 2017-06-12 DIAGNOSIS — J45909 Unspecified asthma, uncomplicated: Secondary | ICD-10-CM | POA: Insufficient documentation

## 2017-06-12 DIAGNOSIS — J4520 Mild intermittent asthma, uncomplicated: Secondary | ICD-10-CM

## 2017-06-12 NOTE — Assessment & Plan Note (Signed)
Controlled without flare  Cont on albuterol As needed

## 2017-06-12 NOTE — Progress Notes (Signed)
@Patient  ID: Susan Peck, female    DOB: 1966/03/08, 51 y.o.   MRN: [id number]  Chief Complaint  Patient presents with  . Follow-up    OSA    Referring provider: Shela Leff, MD  HPI: 51 yo female never smoker with moderate OSA  And Intermittent Asthma . Probable ACE inhibitor cough in 2016 , ACE stopped.  Has NICM /CHF followed by Cardiology s/p ICD    TEST /Events  2012 sleep study [facility] 16/hr.  CPAP 12cm,  PFT on August 07 2014  with an FEV1 at 72%, ratio 88, no significant bronchodilator response, FVC decreased at 65%, DLCO 78% High-resolution CT chest showed no evidence for ILD, small bilateral pleural effusions, right greater than left. Mild diffuse groundglass attenuation throughout the lungs most compatible with pulmonary edema/CHF  Last echo showed 2014 with EF 60%, DD gr 1 , (prev EF 30% on prior echos )  >07/2014 ACE stopped. , lasix restarted.    06/12/2017 Follow up: OSA  Patient returns for a follow-up visit.  She was last seen in 2016.  At that time patient was having some intermittent asthma symptoms.  Her ACE inhibitor was stopped.  Patient says that symptoms have improved.  She denies any cough or wheezing.. Rare albuterol use  Patient has congestive heart failure followed by cardiology.  She had a ICD placed in May of this year.  Patient has a history of moderate sleep apnea.  She was on CPAP many years ago.  Says that she has not worn this in a long time.  Her machine is old and does not work.  Says that she has been having some daytime sleepiness snoring and trouble waking up frequently.  She would like to get back on her CPAP.  Needs an order for a new machine.  We discussed the importance of CPAP compliance.  Patient education given on potential complications of untreated sleep apnea.   Allergies  Allergen Reactions  . Shrimp [Shellfish Allergy] Anaphylaxis     There is no immunization history on file for this patient.  Past Medical History:    Diagnosis Date  . AICD (automatic cardioverter/defibrillator) present 12/13/2016  . Anxiety    no meds  . Asthma   . CHF (congestive heart failure) ([place])   . Depression    no meds  . Diabetes mellitus    recent dx 11/17/11 - started med 11/18/11 type 2  . GERD (gastroesophageal reflux disease)   . Goiter 07/27/2011   non-neoplastic goiter - fine needle aspiration - benign sees dr Dwyane Dee for  . Heart murmur    dx 2 yrs ago per pt  . Herpes genitalis in women   . Hypertension   . IBS (irritable bowel syndrome)    tx with diet per pt  . Low back pain    history  . Obesity   . Seasonal allergies   . Shortness of breath    occasional - exercise induced  . Sleep apnea    cpap broken  . Systolic heart failure    May 2011 EF 35-40%, 04/03/11 EF 25-30%, 06/27/11 EF 30-35%    Tobacco History: Social History   Tobacco Use  Smoking Status Never Smoker  Smokeless Tobacco Never Used   Counseling given: Not Answered   Outpatient Encounter Medications as of 06/12/2017  Medication Sig  . albuterol (PROVENTIL HFA;VENTOLIN HFA) 108 (90 Base) MCG/ACT inhaler Inhale 1-2 puffs into the lungs every 6 (six) hours as needed for wheezing  or shortness of breath.  Marland Kitchen aspirin 81 MG chewable tablet Chew 81 mg by mouth daily.   Marland Kitchen BAYER CONTOUR TEST test strip   . carvedilol (COREG) 25 MG tablet Take 1 tablet (25 mg total) by mouth 2 (two) times daily with a meal.  . diclofenac sodium (VOLTAREN) 1 % GEL Apply 2 g 4 (four) times daily topically.  . DULoxetine (CYMBALTA) 30 MG capsule Take 1 capsule (30 mg total) by mouth daily. Take with food  . empagliflozin (JARDIANCE) 10 MG TABS tablet Take 10 mg by mouth daily with breakfast. Dx Code E11.65  . Estradiol 10 MCG TABS vaginal tablet Place vaginally at night for 2 weeks and then twice weekly there after  . fluticasone (FLONASE) 50 MCG/ACT nasal spray Place 1 spray into both nostrils 2 (two) times daily as needed for allergies or rhinitis. (Patient  taking differently: Place 1 spray into both nostrils daily as needed for allergies or rhinitis. )  . furosemide (LASIX) 40 MG tablet TAKE 1 TABLET BY MOUTH TWICE A DAY  . glimepiride (AMARYL) 2 MG tablet Take 2 mg by mouth daily with breakfast.   . ketoconazole (NIZORAL) 2 % cream Apply 1 application topically daily.  Marland Kitchen loratadine (CLARITIN) 10 MG tablet Take 1 tablet (10 mg total) by mouth daily. (Patient taking differently: Take 10 mg by mouth daily as needed for allergies. )  . metFORMIN (GLUCOPHAGE-XR) 500 MG 24 hr tablet Take 500 mg by mouth daily with breakfast.  . oxyCODONE-acetaminophen (PERCOCET) 5-325 MG tablet Take 1-2 tablets by mouth every 6 (six) hours as needed.  . pantoprazole (PROTONIX) 40 MG tablet Take 1 tablet (40 mg total) by mouth daily.  Marland Kitchen PATADAY 0.2 % SOLN Place 1 drop into both eyes daily.  Marland Kitchen PRESCRIPTION MEDICATION Inhale into the lungs at bedtime. CPAP  . ranitidine (ZANTAC) 150 MG tablet Take 1 tablet (150 mg total) by mouth 2 (two) times daily.  . sacubitril-valsartan (ENTRESTO) 97-103 MG Take 1 tablet by mouth 2 (two) times daily.  . sodium chloride (OCEAN) 0.65 % SOLN nasal spray Place 2 sprays into both nostrils daily as needed for congestion.  Marland Kitchen terconazole (TERAZOL 7) 0.4 % vaginal cream Place 1 applicator vaginally at bedtime.  Marland Kitchen atorvastatin (LIPITOR) 40 MG tablet Take 40 mg by mouth daily.  Marland Kitchen levothyroxine (SYNTHROID, LEVOTHROID) 50 MCG tablet Take 1 tablet (50 mcg total) by mouth daily. (Patient not taking: Reported on 06/12/2017)  . [DISCONTINUED] estradiol (ESTRACE) 1 MG tablet Take 1 mg by mouth daily.   Facility-Administered Encounter Medications as of 06/12/2017  Medication  . aminophylline injection 150 mg     Review of Systems  Constitutional:   No  weight loss, night sweats,  Fevers, chills, + fatigue, or  lassitude.  HEENT:   No headaches,  Difficulty swallowing,  Tooth/dental problems, or  Sore throat,                No sneezing, itching,  ear ache, nasal congestion, post nasal drip,   CV:  No chest pain,  Orthopnea, PND, swelling in lower extremities, anasarca, dizziness, palpitations, syncope.   GI  No heartburn, indigestion, abdominal pain, nausea, vomiting, diarrhea, change in bowel habits, loss of appetite, bloody stools.   Resp: No shortness of breath with exertion or at rest.  No excess mucus, no productive cough,  No non-productive cough,  No coughing up of blood.  No change in color of mucus.  No wheezing.  No chest wall deformity  Skin: no rash or lesions.  GU: no dysuria, change in color of urine, no urgency or frequency.  No flank pain, no hematuria   MS:  No joint pain or swelling.  No decreased range of motion.  No back pain.    Physical Exam  BP 138/84 (BP Location: Right Arm, Cuff Size: Normal)   Pulse 83   Ht 5\' 4"  (1.626 m)   Wt 196 lb 3.2 oz (89 kg)   SpO2 98%   BMI 33.68 kg/m   GEN: A/Ox3; pleasant , NAD, obese    HEENT:  [place]/AT,  EACs-clear, TMs-wnl, NOSE-clear, THROAT-clear, no lesions, no postnasal drip or exudate noted. Class 2-3 MP airway   NECK:  Supple w/ fair ROM; no JVD; normal carotid impulses w/o bruits; no thyromegaly or nodules palpated; no lymphadenopathy.    RESP  Clear  P & A; w/o, wheezes/ rales/ or rhonchi. no accessory muscle use, no dullness to percussion  CARD:  RRR, no m/r/g, no peripheral edema, pulses intact, no cyanosis or clubbing.  GI:   Soft & nt; nml bowel sounds; no organomegaly or masses detected.   Musco: Warm bil, no deformities or joint swelling noted.   Neuro: alert, no focal deficits noted.    Skin: Warm, no lesions or rashes    Lab Results:  CBC   BMET   Imaging: No results found.   Assessment & Plan:   Obstructive sleep apnea Moderate OSA - needs new CPAP machine . encouarged on compliance  Has NICM/CHF   Plan  Patient Instructions  Restart CPAP At bedtime  .  Wear CPAP each night for at least 4 hrs each night .  Do not drive if  sleepy .  Work on healthy weight .  Follow up with Dr. Elsworth Soho in 2 months and As needed       Asthma Controlled without flare  Cont on albuterol As needed       Rexene Edison, NP 06/12/2017

## 2017-06-12 NOTE — Assessment & Plan Note (Signed)
Moderate OSA - needs new CPAP machine . encouarged on compliance  Has NICM/CHF   Plan  Patient Instructions  Restart CPAP At bedtime  .  Wear CPAP each night for at least 4 hrs each night .  Do not drive if sleepy .  Work on healthy weight .  Follow up with Dr. Elsworth Soho in 2 months and As needed

## 2017-06-12 NOTE — Telephone Encounter (Signed)
Pt was moved from MR's schedule to TP's schedule.  Parke Poisson, CMA called and made pt aware of the provider change. Nothing further needed.

## 2017-06-12 NOTE — Telephone Encounter (Signed)
Spoke with patient and she stated that jardiance makes her really sick to her stomach and wants to know what else she can do- please advise

## 2017-06-12 NOTE — Patient Instructions (Signed)
Restart CPAP At bedtime  .  Wear CPAP each night for at least 4 hrs each night .  Do not drive if sleepy .  Work on healthy weight .  Follow up with Dr. Elsworth Soho in 2 months and As needed

## 2017-06-12 NOTE — Telephone Encounter (Signed)
Has she tried taking this at bedtime or after supper?  She also needs to make a follow-up appointment with labs.  If not able to take that she can stop

## 2017-06-12 NOTE — Telephone Encounter (Signed)
Patient called and states she wants to talk to the nurse. Patient did not disclose what she needed. She is requesting a call back . Her contact number is [phone]. Thank you

## 2017-06-12 NOTE — Progress Notes (Signed)
I have reviewed and agree with assessment/plan.  Chesley Mires, MD [facility] Pulmonary/Critical Care 06/12/2017, 12:26 PM Pager:  [phone]

## 2017-06-13 ENCOUNTER — Telehealth: Payer: Self-pay | Admitting: Adult Health

## 2017-06-13 DIAGNOSIS — G4733 Obstructive sleep apnea (adult) (pediatric): Secondary | ICD-10-CM

## 2017-06-13 NOTE — Telephone Encounter (Signed)
Order placed. Pt notified and nothing further is needed.

## 2017-06-13 NOTE — Telephone Encounter (Signed)
Per TP: okay for CPAP titration.  Thank you.

## 2017-06-13 NOTE — Telephone Encounter (Signed)
Per Bethanne Ginger with [employer], pt will need cpap titration before replacement due to break in therapy.   TP please advise. Thanks.

## 2017-06-20 NOTE — Telephone Encounter (Signed)
Left detailed message about the note below and requested she call back to schedule f/u visit with labs

## 2017-06-27 ENCOUNTER — Ambulatory Visit (INDEPENDENT_AMBULATORY_CARE_PROVIDER_SITE_OTHER): Payer: Medicare Other | Admitting: *Deleted

## 2017-06-27 DIAGNOSIS — I428 Other cardiomyopathies: Secondary | ICD-10-CM | POA: Diagnosis not present

## 2017-06-27 DIAGNOSIS — M9902 Segmental and somatic dysfunction of thoracic region: Secondary | ICD-10-CM | POA: Diagnosis not present

## 2017-06-27 DIAGNOSIS — M9903 Segmental and somatic dysfunction of lumbar region: Secondary | ICD-10-CM | POA: Diagnosis not present

## 2017-06-27 DIAGNOSIS — M5417 Radiculopathy, lumbosacral region: Secondary | ICD-10-CM | POA: Diagnosis not present

## 2017-06-27 DIAGNOSIS — M9905 Segmental and somatic dysfunction of pelvic region: Secondary | ICD-10-CM | POA: Diagnosis not present

## 2017-06-27 DIAGNOSIS — M5137 Other intervertebral disc degeneration, lumbosacral region: Secondary | ICD-10-CM | POA: Diagnosis not present

## 2017-06-27 DIAGNOSIS — M5415 Radiculopathy, thoracolumbar region: Secondary | ICD-10-CM | POA: Diagnosis not present

## 2017-06-27 LAB — CUP PACEART REMOTE DEVICE CHECK
Battery Voltage: 3.01 V
Brady Statistic AP VS Percent: 0.01 %
Brady Statistic RA Percent Paced: 0.31 %
Brady Statistic RV Percent Paced: 99.9 %
HIGH POWER IMPEDANCE MEASURED VALUE: 80 Ohm
Implantable Lead Implant Date: 20180524
Implantable Lead Implant Date: 20180524
Implantable Lead Location: 753859
Implantable Lead Location: 753860
Implantable Lead Model: 4398
Implantable Lead Model: 5076
Implantable Pulse Generator Implant Date: 20180524
Lead Channel Impedance Value: 178.5 Ohm
Lead Channel Impedance Value: 178.5 Ohm
Lead Channel Impedance Value: 178.5 Ohm
Lead Channel Impedance Value: 199.5 Ohm
Lead Channel Impedance Value: 199.5 Ohm
Lead Channel Impedance Value: 323 Ohm
Lead Channel Impedance Value: 456 Ohm
Lead Channel Impedance Value: 570 Ohm
Lead Channel Impedance Value: 608 Ohm
Lead Channel Impedance Value: 608 Ohm
Lead Channel Impedance Value: 665 Ohm
Lead Channel Impedance Value: 665 Ohm
Lead Channel Impedance Value: 665 Ohm
Lead Channel Pacing Threshold Amplitude: 0.625 V
Lead Channel Pacing Threshold Amplitude: 0.625 V
Lead Channel Pacing Threshold Pulse Width: 0.4 ms
Lead Channel Sensing Intrinsic Amplitude: 1.75 mV
Lead Channel Sensing Intrinsic Amplitude: 31.625 mV
Lead Channel Setting Pacing Amplitude: 1.25 V
Lead Channel Setting Pacing Amplitude: 1.5 V
Lead Channel Setting Pacing Amplitude: 2.5 V
Lead Channel Setting Pacing Pulse Width: 0.4 ms
MDC IDC LEAD IMPLANT DT: 20180524
MDC IDC LEAD LOCATION: 753858
MDC IDC MSMT BATTERY REMAINING LONGEVITY: 98 mo
MDC IDC MSMT LEADCHNL LV IMPEDANCE VALUE: 399 Ohm
MDC IDC MSMT LEADCHNL LV IMPEDANCE VALUE: 399 Ohm
MDC IDC MSMT LEADCHNL LV IMPEDANCE VALUE: 399 Ohm
MDC IDC MSMT LEADCHNL LV IMPEDANCE VALUE: 608 Ohm
MDC IDC MSMT LEADCHNL LV PACING THRESHOLD PULSEWIDTH: 0.4 ms
MDC IDC MSMT LEADCHNL RA IMPEDANCE VALUE: 361 Ohm
MDC IDC MSMT LEADCHNL RA SENSING INTR AMPL: 1.75 mV
MDC IDC MSMT LEADCHNL RV PACING THRESHOLD AMPLITUDE: 0.5 V
MDC IDC MSMT LEADCHNL RV PACING THRESHOLD PULSEWIDTH: 0.4 ms
MDC IDC MSMT LEADCHNL RV SENSING INTR AMPL: 31.625 mV
MDC IDC SESS DTM: 20181205151028
MDC IDC SET LEADCHNL LV PACING PULSEWIDTH: 0.4 ms
MDC IDC SET LEADCHNL RV SENSING SENSITIVITY: 0.3 mV
MDC IDC STAT BRADY AP VP PERCENT: 0.3 %
MDC IDC STAT BRADY AS VP PERCENT: 99.65 %
MDC IDC STAT BRADY AS VS PERCENT: 0.04 %

## 2017-06-27 NOTE — Progress Notes (Signed)
Remote ICD transmission.   

## 2017-06-28 ENCOUNTER — Encounter: Payer: Self-pay | Admitting: Cardiology

## 2017-06-28 DIAGNOSIS — M9903 Segmental and somatic dysfunction of lumbar region: Secondary | ICD-10-CM | POA: Diagnosis not present

## 2017-06-28 DIAGNOSIS — M9902 Segmental and somatic dysfunction of thoracic region: Secondary | ICD-10-CM | POA: Diagnosis not present

## 2017-06-28 DIAGNOSIS — M5415 Radiculopathy, thoracolumbar region: Secondary | ICD-10-CM | POA: Diagnosis not present

## 2017-06-28 DIAGNOSIS — M5137 Other intervertebral disc degeneration, lumbosacral region: Secondary | ICD-10-CM | POA: Diagnosis not present

## 2017-06-28 DIAGNOSIS — M9905 Segmental and somatic dysfunction of pelvic region: Secondary | ICD-10-CM | POA: Diagnosis not present

## 2017-06-28 DIAGNOSIS — M5417 Radiculopathy, lumbosacral region: Secondary | ICD-10-CM | POA: Diagnosis not present

## 2017-07-05 DIAGNOSIS — M5415 Radiculopathy, thoracolumbar region: Secondary | ICD-10-CM | POA: Diagnosis not present

## 2017-07-05 DIAGNOSIS — M9905 Segmental and somatic dysfunction of pelvic region: Secondary | ICD-10-CM | POA: Diagnosis not present

## 2017-07-05 DIAGNOSIS — M5417 Radiculopathy, lumbosacral region: Secondary | ICD-10-CM | POA: Diagnosis not present

## 2017-07-05 DIAGNOSIS — M5137 Other intervertebral disc degeneration, lumbosacral region: Secondary | ICD-10-CM | POA: Diagnosis not present

## 2017-07-05 DIAGNOSIS — M9902 Segmental and somatic dysfunction of thoracic region: Secondary | ICD-10-CM | POA: Diagnosis not present

## 2017-07-05 DIAGNOSIS — M9903 Segmental and somatic dysfunction of lumbar region: Secondary | ICD-10-CM | POA: Diagnosis not present

## 2017-07-06 DIAGNOSIS — M9905 Segmental and somatic dysfunction of pelvic region: Secondary | ICD-10-CM | POA: Diagnosis not present

## 2017-07-06 DIAGNOSIS — M5137 Other intervertebral disc degeneration, lumbosacral region: Secondary | ICD-10-CM | POA: Diagnosis not present

## 2017-07-06 DIAGNOSIS — M5417 Radiculopathy, lumbosacral region: Secondary | ICD-10-CM | POA: Diagnosis not present

## 2017-07-06 DIAGNOSIS — M9903 Segmental and somatic dysfunction of lumbar region: Secondary | ICD-10-CM | POA: Diagnosis not present

## 2017-07-06 DIAGNOSIS — M5415 Radiculopathy, thoracolumbar region: Secondary | ICD-10-CM | POA: Diagnosis not present

## 2017-07-06 DIAGNOSIS — M9902 Segmental and somatic dysfunction of thoracic region: Secondary | ICD-10-CM | POA: Diagnosis not present

## 2017-07-09 DIAGNOSIS — M9902 Segmental and somatic dysfunction of thoracic region: Secondary | ICD-10-CM | POA: Diagnosis not present

## 2017-07-09 DIAGNOSIS — M5137 Other intervertebral disc degeneration, lumbosacral region: Secondary | ICD-10-CM | POA: Diagnosis not present

## 2017-07-09 DIAGNOSIS — M5417 Radiculopathy, lumbosacral region: Secondary | ICD-10-CM | POA: Diagnosis not present

## 2017-07-09 DIAGNOSIS — M9905 Segmental and somatic dysfunction of pelvic region: Secondary | ICD-10-CM | POA: Diagnosis not present

## 2017-07-09 DIAGNOSIS — M5415 Radiculopathy, thoracolumbar region: Secondary | ICD-10-CM | POA: Diagnosis not present

## 2017-07-09 DIAGNOSIS — M9903 Segmental and somatic dysfunction of lumbar region: Secondary | ICD-10-CM | POA: Diagnosis not present

## 2017-07-11 ENCOUNTER — Other Ambulatory Visit: Payer: Self-pay

## 2017-07-11 ENCOUNTER — Encounter (HOSPITAL_COMMUNITY): Payer: Self-pay | Admitting: Emergency Medicine

## 2017-07-11 ENCOUNTER — Emergency Department (HOSPITAL_COMMUNITY)
Admission: EM | Admit: 2017-07-11 | Discharge: 2017-07-11 | Disposition: A | Discharge status: Home / Self Care | Payer: Medicare Other | Attending: Emergency Medicine | Admitting: Emergency Medicine

## 2017-07-11 ENCOUNTER — Emergency Department (HOSPITAL_BASED_OUTPATIENT_CLINIC_OR_DEPARTMENT_OTHER): Admit: 2017-07-11 | Discharge: 2017-07-11 | Disposition: A | Discharge status: Home / Self Care | Payer: Medicare Other

## 2017-07-11 ENCOUNTER — Emergency Department (HOSPITAL_COMMUNITY): Payer: Medicare Other

## 2017-07-11 DIAGNOSIS — M79609 Pain in unspecified limb: Secondary | ICD-10-CM | POA: Diagnosis not present

## 2017-07-11 DIAGNOSIS — S4992XA Unspecified injury of left shoulder and upper arm, initial encounter: Secondary | ICD-10-CM

## 2017-07-11 DIAGNOSIS — Z7984 Long term (current) use of oral hypoglycemic drugs: Secondary | ICD-10-CM | POA: Insufficient documentation

## 2017-07-11 DIAGNOSIS — Z7982 Long term (current) use of aspirin: Secondary | ICD-10-CM | POA: Diagnosis not present

## 2017-07-11 DIAGNOSIS — M7989 Other specified soft tissue disorders: Secondary | ICD-10-CM

## 2017-07-11 DIAGNOSIS — Y929 Unspecified place or not applicable: Secondary | ICD-10-CM | POA: Diagnosis not present

## 2017-07-11 DIAGNOSIS — Z79899 Other long term (current) drug therapy: Secondary | ICD-10-CM | POA: Diagnosis not present

## 2017-07-11 DIAGNOSIS — M25521 Pain in right elbow: Secondary | ICD-10-CM | POA: Diagnosis not present

## 2017-07-11 DIAGNOSIS — S59901A Unspecified injury of right elbow, initial encounter: Secondary | ICD-10-CM | POA: Diagnosis not present

## 2017-07-11 DIAGNOSIS — E119 Type 2 diabetes mellitus without complications: Secondary | ICD-10-CM | POA: Diagnosis not present

## 2017-07-11 DIAGNOSIS — Y9301 Activity, walking, marching and hiking: Secondary | ICD-10-CM | POA: Insufficient documentation

## 2017-07-11 DIAGNOSIS — J45909 Unspecified asthma, uncomplicated: Secondary | ICD-10-CM | POA: Insufficient documentation

## 2017-07-11 DIAGNOSIS — S40922A Unspecified superficial injury of left upper arm, initial encounter: Secondary | ICD-10-CM | POA: Diagnosis not present

## 2017-07-11 DIAGNOSIS — Y999 Unspecified external cause status: Secondary | ICD-10-CM | POA: Diagnosis not present

## 2017-07-11 DIAGNOSIS — I110 Hypertensive heart disease with heart failure: Secondary | ICD-10-CM | POA: Insufficient documentation

## 2017-07-11 DIAGNOSIS — M25511 Pain in right shoulder: Secondary | ICD-10-CM | POA: Diagnosis not present

## 2017-07-11 DIAGNOSIS — I5022 Chronic systolic (congestive) heart failure: Secondary | ICD-10-CM | POA: Diagnosis not present

## 2017-07-11 DIAGNOSIS — W109XXA Fall (on) (from) unspecified stairs and steps, initial encounter: Secondary | ICD-10-CM | POA: Insufficient documentation

## 2017-07-11 DIAGNOSIS — R2231 Localized swelling, mass and lump, right upper limb: Secondary | ICD-10-CM | POA: Diagnosis not present

## 2017-07-11 MED ORDER — MELOXICAM 15 MG PO TABS
15.0000 mg | ORAL_TABLET | Freq: Every day | ORAL | 0 refills | Status: DC
Start: 2017-07-11 — End: 2017-12-11

## 2017-07-11 MED ORDER — OXYCODONE-ACETAMINOPHEN 5-325 MG PO TABS: 1.0000 | ORAL_TABLET | Freq: Four times a day (QID) | ORAL | 0 refills | Status: DC | PRN

## 2017-07-11 MED ORDER — OXYCODONE-ACETAMINOPHEN 5-325 MG PO TABS
1.0000 | ORAL_TABLET | Freq: Once | ORAL | Status: AC
  Administered 2017-07-11: 1 via ORAL
  Filled 2017-07-11: qty 1

## 2017-07-11 NOTE — ED Provider Notes (Signed)
[facility] EMERGENCY DEPARTMENT Provider Note   CSN: [id number] Arrival date & time: 07/11/17  9983     History   Chief Complaint Chief Complaint  Patient presents with  . Arm Pain    HPI Susan Peck is a 51 y.o. female  who sustained a right shoulder injury 3 day(s) ago.  The patient was seen by myself in a medical screening examination prior to being moved into the low acuity care side.  Mechanism of injury: Patient slipped and fell on her R side slid down the stairs. NO LOC/ head inj. Immediate symptoms: delayed swelling, delayed pain. Symptoms have been abrupt, worsening yesterday since that time. Prior history of related problems: no prior problems with this area in the past. Now has intense pain, swelling and inability to use the R arm Pain is worst in the shoulder and elbow  HPI  Past Medical History:  Diagnosis Date  . AICD (automatic cardioverter/defibrillator) present 12/13/2016  . Anxiety    no meds  . Asthma   . CHF (congestive heart failure) ([place])   . Depression    no meds  . Diabetes mellitus    recent dx 11/17/11 - started med 11/18/11 type 2  . GERD (gastroesophageal reflux disease)   . Goiter 07/27/2011   non-neoplastic goiter - fine needle aspiration - benign sees dr Dwyane Dee for  . Heart murmur    dx 2 yrs ago per pt  . Herpes genitalis in women   . Hypertension   . IBS (irritable bowel syndrome)    tx with diet per pt  . Low back pain    history  . Obesity   . Seasonal allergies   . Shortness of breath    occasional - exercise induced  . Sleep apnea    cpap broken  . Systolic heart failure    May 2011 EF 35-40%, 04/03/11 EF 25-30%, 06/27/11 EF 30-35%    Patient Active Problem List   Diagnosis Date Noted  . Asthma 06/12/2017  . Healthcare maintenance 06/06/2017  . Right shoulder pain 06/05/2017  . Chronic low back pain 04/03/2017  . NICM (nonischemic cardiomyopathy) ([place]) 12/13/2016  . LBBB (left bundle branch block)  12/13/2016  . Chronic pansinusitis 09/20/2016  . Laryngopharyngeal reflux (LPR) 09/20/2016  . HLD (hyperlipidemia) 08/18/2016  . History of colonic polyps 03/08/2016  . Chronic hip pain 12/28/2015  . Diabetes mellitus type II, uncontrolled ([place]) 10/05/2015  . HTN (hypertension) 09/17/2014  . Perimenopause 04/15/2013  . Goiter 07/10/2011  . Obstructive sleep apnea 05/29/2011  . Chronic systolic heart failure ([place]) 04/10/2011    Past Surgical History:  Procedure Laterality Date  . ABDOMINAL HYSTERECTOMY    . BIV ICD INSERTION CRT-D N/A 12/13/2016   Procedure: BiV ICD Insertion CRT-D;  Surgeon: Deboraha Sprang, MD;  Location: [facility];  Service: Cardiovascular;  Laterality: N/A;  . cardiac catherization  2004   [place], [place] - Dr Lynnell Jude  . CARDIAC CATHETERIZATION    . CARDIAC CATHETERIZATION N/A 09/15/2015   Procedure: Right/Left Heart Cath and Coronary Angiography;  Surgeon: Jolaine Artist, MD;  Location: [facility];  Service: Cardiovascular;  Laterality: N/A;  . COLONOSCOPY WITH PROPOFOL N/A 03/26/2015   Procedure: COLONOSCOPY WITH PROPOFOL;  Surgeon: Carol Ada, MD;  Location: [facility];  Service: Endoscopy;  Laterality: N/A;  . colonscopy    . CYSTOSCOPY  11/23/2011   Procedure: CYSTOSCOPY;  Surgeon: Jolayne Haines, MD;  Location: [facility];  Service: Gynecology;  Laterality: N/A;  . DIAGNOSTIC LAPAROSCOPY     of pelvis  . DILATION AND CURETTAGE OF UTERUS  12/2006,  10/2005   hysteroscopy surgery x 2  . INSERTION OF ICD  12/13/2016   BIV  . LAPAROSCOPIC ASSISTED VAGINAL HYSTERECTOMY  11/23/2011   Procedure: LAPAROSCOPIC ASSISTED VAGINAL HYSTERECTOMY;  Surgeon: Jolayne Haines, MD;  Location: [facility];  Service: Gynecology;  Laterality: N/A;  . NOVASURE ABLATION     10/2005  . SALPINGOOPHORECTOMY  11/23/2011   Procedure: SALPINGO OOPHERECTOMY;  Surgeon: Jolayne Haines, MD;  Location: [facility];  Service: Gynecology;  Laterality: Bilateral;  . SVD     x 3  . TOOTH  EXTRACTION N/A 04/05/2017   Procedure: DENTAL EXTRACTIONS OF TEETH FIVE, SEVEN, SIXTEEN, SEVENTEEN;  Surgeon: Diona Browner, DDS;  Location: [facility];  Service: Oral Surgery;  Laterality: N/A;  . TUBAL LIGATION    . WISDOM TOOTH EXTRACTION      OB History    Gravida Para Term Preterm AB Living   7 3     3 3    SAB TAB Ectopic Multiple Live Births   1               Home Medications    Prior to Admission medications   Medication Sig Start Date End Date Taking? Authorizing Provider  albuterol (PROVENTIL HFA;VENTOLIN HFA) 108 (90 Base) MCG/ACT inhaler Inhale 1-2 puffs into the lungs every 6 (six) hours as needed for wheezing or shortness of breath. 08/23/16   Burgess Estelle, MD  aspirin 81 MG chewable tablet Chew 81 mg by mouth daily.     [provider]  atorvastatin (LIPITOR) 40 MG tablet Take 40 mg by mouth daily.    [provider]  BAYER CONTOUR TEST test strip  11/03/15   [provider]  carvedilol (COREG) 25 MG tablet Take 1 tablet (25 mg total) by mouth 2 (two) times daily with a meal. 11/07/16   Bensimhon, Shaune Pascal, MD  diclofenac sodium (VOLTAREN) 1 % GEL Apply 2 g 4 (four) times daily topically. 06/05/17   Shela Leff, MD  DULoxetine (CYMBALTA) 30 MG capsule Take 1 capsule (30 mg total) by mouth daily. Take with food 04/13/17   Elayne Snare, MD  empagliflozin (JARDIANCE) 10 MG TABS tablet Take 10 mg by mouth daily with breakfast. Dx Code E11.65 04/23/17   Elayne Snare, MD  Estradiol 10 MCG TABS vaginal tablet Place vaginally at night for 2 weeks and then twice weekly there after 05/22/17   Huel Cote, NP  fluticasone (FLONASE) 50 MCG/ACT nasal spray Place 1 spray into both nostrils 2 (two) times daily as needed for allergies or rhinitis. Patient taking differently: Place 1 spray into both nostrils daily as needed for allergies or rhinitis.  11/08/16   Molt, Bethany, DO  furosemide (LASIX) 40 MG tablet TAKE 1 TABLET BY MOUTH TWICE A DAY 06/04/17    Bensimhon, Shaune Pascal, MD  glimepiride (AMARYL) 2 MG tablet Take 2 mg by mouth daily with breakfast.     [provider]  ketoconazole (NIZORAL) 2 % cream Apply 1 application topically daily. 03/01/17   [provider]  levothyroxine (SYNTHROID, LEVOTHROID) 50 MCG tablet Take 1 tablet (50 mcg total) by mouth daily. Patient not taking: Reported on 06/12/2017 03/17/16   Elayne Snare, MD  loratadine (CLARITIN) 10 MG tablet Take 1 tablet (10 mg total) by mouth daily. Patient taking differently: Take 10 mg by mouth daily as  needed for allergies.  08/23/16 08/23/17  Burgess Estelle, MD  meloxicam (MOBIC) 15 MG tablet Take 1 tablet (15 mg total) by mouth daily. 07/11/17   Margarita Mail, PA-C  metFORMIN (GLUCOPHAGE-XR) 500 MG 24 hr tablet Take 500 mg by mouth daily with breakfast.    [provider]  oxyCODONE-acetaminophen (ROXICET) 5-325 MG tablet Take 1 tablet by mouth every 6 (six) hours as needed for severe pain. 07/11/17   Sharla Tankard, Vernie Shanks, PA-C  pantoprazole (PROTONIX) 40 MG tablet Take 1 tablet (40 mg total) by mouth daily. 04/17/17 04/17/18  Oval Linsey, MD  PATADAY 0.2 % SOLN Place 1 drop into both eyes daily. 03/19/17   [provider]  PRESCRIPTION MEDICATION Inhale into the lungs at bedtime. CPAP    [provider]  ranitidine (ZANTAC) 150 MG tablet Take 1 tablet (150 mg total) by mouth 2 (two) times daily. 11/05/16 11/05/17  Molt, Bethany, DO  sacubitril-valsartan (ENTRESTO) 97-103 MG Take 1 tablet by mouth 2 (two) times daily. 01/29/17   Bensimhon, Shaune Pascal, MD  sodium chloride (OCEAN) 0.65 % SOLN nasal spray Place 2 sprays into both nostrils daily as needed for congestion. 05/16/17   Chundi, Verne Spurr, MD  terconazole (TERAZOL 7) 0.4 % vaginal cream Place 1 applicator vaginally at bedtime. 05/22/17   Huel Cote, NP    Family History Family History  Problem Relation Age of Onset  . Heart disease Maternal Aunt   . Breast cancer Mother   . Thyroid  disease Mother   . Hypertension Maternal Grandmother   . Diabetes Maternal Grandmother   . Breast cancer Maternal Aunt   . Heart disease Maternal Aunt   . Diabetes Maternal Grandfather   . Diabetes Paternal Grandmother   . Diabetes Paternal Grandfather   . Hypertension Paternal Grandfather     Social History Social History   Tobacco Use  . Smoking status: Never Smoker  . Smokeless tobacco: Never Used  Substance Use Topics  . Alcohol use: No    Alcohol/week: 0.0 oz  . Drug use: No     Allergies   Shrimp [shellfish allergy]   Review of Systems Review of Systems  Ten systems reviewed and are negative for acute change, except as noted in the HPI.   Physical Exam Updated Vital Signs BP (!) 162/102 (BP Location: Left Arm)   Pulse 78   Temp 97.7 F (36.5 C) (Oral)   Resp 20   SpO2 96%   Physical Exam Physical Exam  Nursing note and vitals reviewed. Constitutional: She is oriented to person, place, and time. She appears well-developed and well-nourished. No distress.  HENT:  Head: Normocephalic and atraumatic.  Eyes: Conjunctivae normal and EOM are normal. Pupils are equal, round, and reactive to light. No scleral icterus.  Neck: Normal range of motion.  Cardiovascular: Normal rate, regular rhythm and normal heart sounds.  Exam reveals no gallop and no friction rub.   No murmur heard. Pulmonary/Chest: Effort normal and breath sounds normal. No respiratory distress.  Abdominal: Soft. Bowel sounds are normal. She exhibits no distension and no mass. There is no tenderness. There is no guarding.  Musculoskeletal:Shoulder exam: R arm is swollen compared to the Left. The shoulder appears to be sitting lower than the Left. Exquisitely TPP at the shoulder and elbow. Strong radial pulse. Normal grip and sensation. Neurological: She is alert and oriented to person, place, and time.  Skin: Skin is warm and dry. She is not diaphoretic.     ED Treatments /  Results  Labs (all  labs ordered are listed, but only abnormal results are displayed) Labs Reviewed - No data to display  EKG  EKG Interpretation None       Radiology Dg Shoulder Right  Result Date: 07/11/2017 CLINICAL DATA:  Right shoulder pain after fall down stairs. EXAM: RIGHT SHOULDER - 2+ VIEW COMPARISON:  None. FINDINGS: There is no evidence of fracture or dislocation. There is no evidence of arthropathy or other focal bone abnormality. Soft tissues are unremarkable. IMPRESSION: Normal right shoulder. Electronically Signed   By: Marijo Conception, M.D.   On: 07/11/2017 11:55   Dg Elbow Complete Right  Result Date: 07/11/2017 CLINICAL DATA:  Right elbow pain after fall down stairs. EXAM: RIGHT ELBOW - COMPLETE 3+ VIEW COMPARISON:  None. FINDINGS: There is no evidence of fracture, dislocation, or joint effusion. There is no evidence of arthropathy or other focal bone abnormality. Soft tissues are unremarkable. IMPRESSION: Normal right elbow. Electronically Signed   By: Marijo Conception, M.D.   On: 07/11/2017 11:57    Procedures Procedures (including critical care time)  Medications Ordered in ED Medications  oxyCODONE-acetaminophen (PERCOCET/ROXICET) 5-325 MG per tablet 1 tablet (1 tablet Oral Given 07/11/17 1109)     Initial Impression / Assessment and Plan / ED Course  I have reviewed the triage vital signs and the nursing notes.  Pertinent labs & imaging results that were available during my care of the patient were reviewed by me and considered in my medical decision making (see chart for details).     Patient's imaging is negative for any acute abnormality.  Her ultrasound DVT study is also negative.  Patient will be discharged with pain medication and anti-inflammatories.  She should follow-up with orthopedics.  I suspect muscle spasm.  She appears appropriate for discharge at this time.  Final Clinical Impressions(s) / ED Diagnoses   Final diagnoses:  Arm injury, left, initial  encounter    ED Discharge Orders        Ordered    oxyCODONE-acetaminophen (ROXICET) 5-325 MG tablet  Every 6 hours PRN     07/11/17 1347    meloxicam (MOBIC) 15 MG tablet  Daily     07/11/17 1347       Margarita Mail, PA-C 07/11/17 1350    Charlesetta Shanks, MD 07/11/17 1654

## 2017-07-11 NOTE — ED Notes (Signed)
Pt states she understands instructions. Home stable with family\. 

## 2017-07-11 NOTE — ED Provider Notes (Signed)
MSE was initiated and I personally evaluated the patient and placed orders (if any) at  11:05 AM on July 11, 2017.  The patient appears stable so that the remainder of the MSE may be completed by another provider.    Susan Peck is a 51 y.o. female who sustained a right shoulder injury 3 day(s) ago. Mechanism of injury: Patient slipped and fell on her R side slid down the stairs. NO LOC/ head inj. Immediate symptoms: delayed swelling, delayed pain. Symptoms have been abrupt, worsening yesterday since that time. Prior history of related problems: no prior problems with this area in the past. Now has intense pain, swelling and inability to use the R arm Pain is worst in the shoulder and elbow.  OBJECTIVE: Vital signs as noted above. Appearance: alert, well appearing, and in no distress and crying. Shoulder exam: R arm is swollen compared to the Left. The shoulder appears to be sitting lower than the Left. Exquisitely TPP at the shoulder and elbow. Strong radial pulse. Normal grip and sensation.   ASSESSMENT: Shoulder injury  PLAN: Start with Images of the shoulder and elbow. + sling for comfort. Pain control  See orders for this visit as documented in the electronic medical record.    Margarita Mail, PA-C 07/11/17 1116    Charlesetta Shanks, MD 07/11/17 [phone]

## 2017-07-11 NOTE — ED Notes (Signed)
Patient transported to X-ray 

## 2017-07-11 NOTE — ED Triage Notes (Signed)
Pt fell down 8 steps on right side- had swollen thigh but has gotten better. R arm been hurting ever since but worsening. R arm swollen; Tender to touch from shoulder all the way down. She initially was able to move it and mildly painful. Sensation intact.Pulses strong.

## 2017-07-11 NOTE — Discharge Instructions (Signed)
Contact a health care provider if: °Your pain gets worse. °Your pain is not relieved with medicines. °New pain develops in your arm, hand, or fingers. °Get help right away if: °Your arm, hand, or fingers: °Tingle. °Become numb. °Become swollen. °Become painful. °Turn white or blue. °

## 2017-07-11 NOTE — Progress Notes (Signed)
*  PRELIMINARY RESULTS* Vascular Ultrasound Right upper extremity venous duplex has been completed.  Preliminary findings: the visualized veins of the right upper extremity appear negative for deep and superficial vein thrombosis.  Myrtie Cruise Vernice Bowker 07/11/2017, 1:10 PM

## 2017-07-14 ENCOUNTER — Encounter (HOSPITAL_BASED_OUTPATIENT_CLINIC_OR_DEPARTMENT_OTHER): Payer: Medicare Other

## 2017-07-18 ENCOUNTER — Ambulatory Visit (HOSPITAL_COMMUNITY)
Admission: RE | Admit: 2017-07-18 | Discharge: 2017-07-18 | Disposition: A | Discharge status: Home / Self Care | Payer: Medicare Other | Source: Ambulatory Visit | Attending: Internal Medicine | Admitting: Internal Medicine

## 2017-07-18 ENCOUNTER — Encounter (HOSPITAL_COMMUNITY): Payer: Self-pay | Admitting: Internal Medicine

## 2017-07-18 VITALS — BP 119/82 | HR 76 | Wt 198.0 lb

## 2017-07-18 DIAGNOSIS — K589 Irritable bowel syndrome without diarrhea: Secondary | ICD-10-CM | POA: Insufficient documentation

## 2017-07-18 DIAGNOSIS — Z95810 Presence of automatic (implantable) cardiac defibrillator: Secondary | ICD-10-CM | POA: Diagnosis not present

## 2017-07-18 DIAGNOSIS — I110 Hypertensive heart disease with heart failure: Secondary | ICD-10-CM | POA: Diagnosis not present

## 2017-07-18 DIAGNOSIS — Z91013 Allergy to seafood: Secondary | ICD-10-CM | POA: Insufficient documentation

## 2017-07-18 DIAGNOSIS — R51 Headache: Secondary | ICD-10-CM | POA: Insufficient documentation

## 2017-07-18 DIAGNOSIS — G4733 Obstructive sleep apnea (adult) (pediatric): Secondary | ICD-10-CM | POA: Insufficient documentation

## 2017-07-18 DIAGNOSIS — Z7982 Long term (current) use of aspirin: Secondary | ICD-10-CM | POA: Insufficient documentation

## 2017-07-18 DIAGNOSIS — F419 Anxiety disorder, unspecified: Secondary | ICD-10-CM | POA: Insufficient documentation

## 2017-07-18 DIAGNOSIS — N62 Hypertrophy of breast: Secondary | ICD-10-CM | POA: Diagnosis not present

## 2017-07-18 DIAGNOSIS — I5022 Chronic systolic (congestive) heart failure: Secondary | ICD-10-CM | POA: Diagnosis not present

## 2017-07-18 DIAGNOSIS — E669 Obesity, unspecified: Secondary | ICD-10-CM | POA: Insufficient documentation

## 2017-07-18 DIAGNOSIS — Z833 Family history of diabetes mellitus: Secondary | ICD-10-CM | POA: Insufficient documentation

## 2017-07-18 DIAGNOSIS — F329 Major depressive disorder, single episode, unspecified: Secondary | ICD-10-CM | POA: Insufficient documentation

## 2017-07-18 DIAGNOSIS — I10 Essential (primary) hypertension: Secondary | ICD-10-CM | POA: Diagnosis not present

## 2017-07-18 DIAGNOSIS — Z8249 Family history of ischemic heart disease and other diseases of the circulatory system: Secondary | ICD-10-CM | POA: Insufficient documentation

## 2017-07-18 DIAGNOSIS — I447 Left bundle-branch block, unspecified: Secondary | ICD-10-CM

## 2017-07-18 DIAGNOSIS — Z90710 Acquired absence of both cervix and uterus: Secondary | ICD-10-CM | POA: Diagnosis not present

## 2017-07-18 DIAGNOSIS — J45909 Unspecified asthma, uncomplicated: Secondary | ICD-10-CM | POA: Diagnosis not present

## 2017-07-18 DIAGNOSIS — Z791 Long term (current) use of non-steroidal anti-inflammatories (NSAID): Secondary | ICD-10-CM | POA: Diagnosis not present

## 2017-07-18 DIAGNOSIS — Z7984 Long term (current) use of oral hypoglycemic drugs: Secondary | ICD-10-CM | POA: Insufficient documentation

## 2017-07-18 DIAGNOSIS — Z79899 Other long term (current) drug therapy: Secondary | ICD-10-CM | POA: Diagnosis not present

## 2017-07-18 DIAGNOSIS — I429 Cardiomyopathy, unspecified: Secondary | ICD-10-CM | POA: Insufficient documentation

## 2017-07-18 DIAGNOSIS — Z803 Family history of malignant neoplasm of breast: Secondary | ICD-10-CM | POA: Insufficient documentation

## 2017-07-18 DIAGNOSIS — E042 Nontoxic multinodular goiter: Secondary | ICD-10-CM | POA: Insufficient documentation

## 2017-07-18 DIAGNOSIS — Z8349 Family history of other endocrine, nutritional and metabolic diseases: Secondary | ICD-10-CM | POA: Insufficient documentation

## 2017-07-18 DIAGNOSIS — K219 Gastro-esophageal reflux disease without esophagitis: Secondary | ICD-10-CM | POA: Insufficient documentation

## 2017-07-18 DIAGNOSIS — E119 Type 2 diabetes mellitus without complications: Secondary | ICD-10-CM | POA: Diagnosis not present

## 2017-07-18 DIAGNOSIS — E785 Hyperlipidemia, unspecified: Secondary | ICD-10-CM | POA: Insufficient documentation

## 2017-07-18 LAB — BASIC METABOLIC PANEL
Anion gap: 6 (ref 5–15)
BUN: 11 mg/dL (ref 6–20)
CALCIUM: 9.1 mg/dL (ref 8.9–10.3)
CO2: 27 mmol/L (ref 22–32)
CREATININE: 0.63 mg/dL (ref 0.44–1.00)
Chloride: 101 mmol/L (ref 101–111)
GFR calc non Af Amer: >60 mL/min (ref >60)
Glucose, Bld: 296 mg/dL — ABNORMAL HIGH (ref 65–99)
Potassium: 3.7 mmol/L (ref 3.5–5.1)
Sodium: 134 mmol/L — ABNORMAL LOW (ref 135–145)

## 2017-07-18 MED ORDER — FUROSEMIDE 40 MG PO TABS: ORAL_TABLET | ORAL | 3 refills | Status: DC

## 2017-07-18 MED ORDER — EPLERENONE 25 MG PO TABS: 12.5000 mg | ORAL_TABLET | Freq: Every day | ORAL | 3 refills | Status: DC

## 2017-07-18 NOTE — Addendum Note (Signed)
Encounter addended by: Scarlette Calico, RN on: 07/18/2017 11:37 AM  Actions taken: Order list changed, Diagnosis association updated, Sign clinical note

## 2017-07-18 NOTE — Progress Notes (Signed)
Patient ID: Susan Peck, female   DOB: September 08, 1965, 51 y.o.   MRN: [id number]   Advanced Heart Failure Clinic Note   PCP: Dr Rae Mar GYN: Dr Carolin Guernsey Pulmonologist: Dr Gwenette Greet HF: Dr. Haroldine Laws   HPI:  Susan Peck is a 51 year old African American female with systolic heart failure, HTN, GERD, 11/23/11 S/P Hysterectomy, multinodular goiter and asthma.   2004 cardiac cath by Dr Lynnell Jude in [place], [place] with one blockage noted - no stent. EF said to be normal. About 1 year ago at [facility] noted murmur and she was referred to Dr Tamala Julian EF 25-30%. More recently, she has been followed in CHF clinic.   She returns today for regular follow up. Feels pretty good except for arm pain after a recent fall. Has sleep study this weekend to get new CPAP. Has some edema particularly in hands. Weight up and down. Has had to take extra lasix several times the past few weeks. Drinking a lot of water. No orthopnea or PND. Can go to store without problem. Recently started Jardiance  ICD interrogated personally. No AF/VT. Fluid up and down. Up today. Personally reviewe    04/03/2011  ECHO EF 25-30% 06/26/2012 ECHO EF 30-35%  02/29/2012 ECHO EF 35-40%  11/18/12 ECHO EF 55% Inferior wall mildly hypokinetic with septal HK Myoview 02/03/10 EF 42% No ischemia Septal and apical hypokinesis 11/14 Cardiac MRI  EF 52%- septal bounce suggestive of LBBB. Dyssynchrony from LBBB leads to the low calculated EF. Normal RV size and systolic function. No myocardial delayed enhancement, so no definite evidence for prior myocardial infarction, myocarditis, or infiltrative disease. 1/16 CT chest - small pleural effusions with early pulmonary edema. No ILD. 09/03/2014: ECHO EF 20-25%  6/16: ECHO EF 35-40% Cardiolite (6/16): EF 36%, no ischemia/infarction.  09/07/2015: ECHO EF 30-35%   CPX 11/20/2014  Peak VO2: 16.1 (76.8% predicted peak VO2) - when corrected to ideal BW pVO2 is 22.9 VE/VCO2 slope: 28.5 OUES: 1.59 Peak RER:  1.18  LHC/RHC 09/15/2015  RA = 2 RV = 22/1/2 PA = 27/8 (19) PCW = 8 Fick cardiac output/index = 5.22/2.8 PVR = 2.1 Ao sat = 98% PA sat = 70%, 71% Assessment: 1. Normal coronary arteries 2. Normal hemodynamics  3. LVEF 35-40% with global HK due to   Labs 08/19/14: K 4.1 Creatinine 0.62   Labs 10/28/2014: K 4.3 Creatinine 0.73  Labs 11/19/2014: K 3.6 Creatinine 0.64  Labs 6/16: digoxin 0.5 Labs 7/16: HIV negative, TSH normal Lipids 8/16: TC 206, TG 133, HDL 29, LDL 150 Labs 08/18/2015: 9.1  Labs 09/07/2015: K 4.4 Creating 0.68  Lab 12/14/2015: K 3.7 Creatinine 0.71   ROS: All other systems normal except as mentioned in HPI, past medical history and problem list.    Past Medical History:  Diagnosis Date  . AICD (automatic cardioverter/defibrillator) present 12/13/2016  . Anxiety    no meds  . Asthma   . CHF (congestive heart failure) ([place])   . Depression    no meds  . Diabetes mellitus    recent dx 11/17/11 - started med 11/18/11 type 2  . GERD (gastroesophageal reflux disease)   . Goiter 07/27/2011   non-neoplastic goiter - fine needle aspiration - benign sees dr Dwyane Dee for  . Heart murmur    dx 2 yrs ago per pt  . Herpes genitalis in women   . Hypertension   . IBS (irritable bowel syndrome)    tx with diet per pt  . Low back pain  history  . Obesity   . Seasonal allergies   . Shortness of breath    occasional - exercise induced  . Sleep apnea    cpap broken  . Systolic heart failure    May 2011 EF 35-40%, 04/03/11 EF 25-30%, 06/27/11 EF 30-35%    Current Outpatient Medications  Medication Sig Dispense Refill  . albuterol (PROVENTIL HFA;VENTOLIN HFA) 108 (90 Base) MCG/ACT inhaler Inhale 1-2 puffs into the lungs every 6 (six) hours as needed for wheezing or shortness of breath. 1 Inhaler 0  . aspirin 81 MG chewable tablet Chew 81 mg by mouth daily.     Marland Kitchen BAYER CONTOUR TEST test strip   1  . carvedilol (COREG) 25 MG tablet Take 1 tablet (25 mg total) by mouth 2  (two) times daily with a meal. 60 tablet 11  . diclofenac sodium (VOLTAREN) 1 % GEL Apply 2 g 4 (four) times daily topically. 100 g 1  . DULoxetine (CYMBALTA) 30 MG capsule Take 1 capsule (30 mg total) by mouth daily. Take with food 30 capsule 3  . empagliflozin (JARDIANCE) 10 MG TABS tablet Take 10 mg by mouth daily with breakfast. Dx Code E11.65 30 tablet 3  . Estradiol 10 MCG TABS vaginal tablet Place vaginally at night for 2 weeks and then twice weekly there after 28 tablet 11  . fluticasone (FLONASE) 50 MCG/ACT nasal spray Place 1 spray into both nostrils 2 (two) times daily as needed for allergies or rhinitis. (Patient taking differently: Place 1 spray into both nostrils daily as needed for allergies or rhinitis. ) 16 g 3  . furosemide (LASIX) 40 MG tablet TAKE 1 TABLET BY MOUTH TWICE A DAY 60 tablet 3  . glimepiride (AMARYL) 2 MG tablet Take 2 mg by mouth daily with breakfast.     . ketoconazole (NIZORAL) 2 % cream Apply 1 application topically daily.  2  . loratadine (CLARITIN) 10 MG tablet Take 1 tablet (10 mg total) by mouth daily. (Patient taking differently: Take 10 mg by mouth daily as needed for allergies. ) 30 tablet 2  . meloxicam (MOBIC) 15 MG tablet Take 1 tablet (15 mg total) by mouth daily. 15 tablet 0  . metFORMIN (GLUCOPHAGE-XR) 500 MG 24 hr tablet Take 500 mg by mouth daily with breakfast.    . oxyCODONE-acetaminophen (ROXICET) 5-325 MG tablet Take 1 tablet by mouth every 6 (six) hours as needed for severe pain. 6 tablet 0  . pantoprazole (PROTONIX) 40 MG tablet Take 1 tablet (40 mg total) by mouth daily. 60 tablet 0  . PATADAY 0.2 % SOLN Place 1 drop into both eyes daily.  4  . PRESCRIPTION MEDICATION Inhale into the lungs at bedtime. CPAP    . ranitidine (ZANTAC) 150 MG tablet Take 1 tablet (150 mg total) by mouth 2 (two) times daily. 180 tablet 3  . sacubitril-valsartan (ENTRESTO) 97-103 MG Take 1 tablet by mouth 2 (two) times daily. 60 tablet 11  . sodium chloride (OCEAN)  0.65 % SOLN nasal spray Place 2 sprays into both nostrils daily as needed for congestion. 15 mL 1  . terconazole (TERAZOL 7) 0.4 % vaginal cream Place 1 applicator vaginally at bedtime. 45 g 0   No current facility-administered medications for this encounter.    Facility-Administered Medications Ordered in Other Encounters  Medication Dose Route Frequency Provider Last Rate Last Dose  . aminophylline injection 150 mg  150 mg Intravenous BID PRN Larey Dresser, MD   150 mg at 12/24/14  1005     Allergies  Allergen Reactions  . Shrimp [Shellfish Allergy] Anaphylaxis    Social History   Socioeconomic History  . Marital status: Divorced    Spouse name: Not on file  . Number of children: Y  . Years of education: Not on file  . Highest education level: Not on file  Social Needs  . Financial resource strain: Not on file  . Food insecurity - worry: Not on file  . Food insecurity - inability: Not on file  . Transportation needs - medical: Not on file  . Transportation needs - non-medical: Not on file  Occupational History  . Occupation: Surveyor, quantity: [employer]    Comment: and Systems analyst  Tobacco Use  . Smoking status: Never Smoker  . Smokeless tobacco: Never Used  Substance and Sexual Activity  . Alcohol use: No    Alcohol/week: 0.0 oz  . Drug use: No  . Sexual activity: No    Birth control/protection: Surgical    Comment: des neg, intercourse age 48, more than sexual parterns  Other Topics Concern  . Not on file  Social History Narrative   She lives with her husband and son.  She is works for [employer].    Family History  Problem Relation Age of Onset  . Heart disease Maternal Aunt   . Breast cancer Mother   . Thyroid disease Mother   . Hypertension Maternal Grandmother   . Diabetes Maternal Grandmother   . Breast cancer Maternal Aunt   . Heart disease Maternal Aunt   . Diabetes Maternal Grandfather   . Diabetes Paternal Grandmother   . Diabetes  Paternal Grandfather   . Hypertension Paternal Grandfather     PHYSICAL EXAM: Vitals:   07/18/17 1034  BP: 119/82  Pulse: 76  SpO2: 97%  Weight: 198 lb (89.8 kg)   Wt Readings from Last 3 Encounters:  07/18/17 198 lb (89.8 kg)  06/12/17 196 lb 3.2 oz (89 kg)  06/05/17 194 lb 14.4 oz (88.4 kg)   General:  Well appearing. No resp difficulty HEENT: normal Neck: supple. JVP 9-10 . Carotids 2+ bilat; no bruits. No lymphadenopathy or thryomegaly appreciated. Cor: PMI nondisplaced. Regular rate & rhythm. No rubs, gallops or murmurs. Lungs: clear Abdomen: obese soft, nontender, nondistended. No hepatosplenomegaly. No bruits or masses. Good bowel sounds. Extremities: no cyanosis, clubbing, rash, tr edema Neuro: alert & orientedx3, cranial nerves grossly intact. moves all 4 extremities w/o difficulty. Affect pleasant    ASSESSMENT & PLAN: 1. Chronic systolic HF: Nonischemic cardiomyopathy.  08/2015 ECHO per Dr Haroldine Laws EF 35-40%, outside ICD range (similar to cMRI).  - Stable early NYHA class II- early III symptoms. - Volume status elevated on exam and ICD - Increase lasix to 80/40  - Add inspra 12.5  BMET 1 week and 4 weks - On goal dose of carvedilol 25 bid.  - Can stay off dig.    - Intolerant to Bidil with severe headaches.  - Continue entresto 97/103 mg BID.  - Intolerant to spiro with painful gynecomastia.  - Chronic LBBB. Will check Echo next visit.  If EF down, may benefit from CRT-D.  2. Chest pressure - No further chest pain. LHC 08/2015 with normal cors.   3. HTN:  - Blood pressure well controlled. Continue current regimen. 4. OSA:  - Encouraged to resume her CPAP. Has sleep study this weekend 5. Multinodular goiter:     - Follows with endocrinology.  6. Hyperlipidemia:  -  Encouraged healthy lifestyle with exercise and weight loss.  - Continue atorvastatin 40 mg daily.   7. DM II: followed by  Endocrinologist.  - Agree with Jardiance   Meds as above.  Labs today  and repeat in 10 days.  Follow up 2 months with MD with Echo.   Glori Bickers, MD  07/18/2017

## 2017-07-18 NOTE — Patient Instructions (Addendum)
Start Inspra 12.5 mg (1/2 tab) daily  Increase Furosemide (Lasix) to 80 mg (2 tabs) in AM and 40 mg (1 tab) in PM  Labs today  Labs in 1 week  Labs in 1 month  Your physician recommends that you schedule a follow-up appointment in: 3 months with echocardiogram

## 2017-07-20 ENCOUNTER — Ambulatory Visit (HOSPITAL_BASED_OUTPATIENT_CLINIC_OR_DEPARTMENT_OTHER): Payer: Medicare Other | Attending: Adult Health | Admitting: Pulmonary Disease

## 2017-07-20 VITALS — Ht 64.0 in | Wt 188.0 lb

## 2017-07-20 DIAGNOSIS — G4733 Obstructive sleep apnea (adult) (pediatric): Secondary | ICD-10-CM | POA: Insufficient documentation

## 2017-07-25 ENCOUNTER — Telehealth (HOSPITAL_COMMUNITY): Payer: Self-pay | Admitting: Pharmacist

## 2017-07-25 NOTE — Telephone Encounter (Signed)
Entresto PA not required through [employer] Part D for 2019.   Ruta Hinds. Velva Harman, PharmD, BCPS, CPP Clinical Pharmacist Pager: [phone] Phone: [phone] 07/25/2017 11:05 AM

## 2017-07-26 ENCOUNTER — Other Ambulatory Visit (HOSPITAL_COMMUNITY): Payer: Medicare Other

## 2017-07-26 DIAGNOSIS — R0600 Dyspnea, unspecified: Secondary | ICD-10-CM | POA: Diagnosis not present

## 2017-07-26 NOTE — Procedures (Signed)
Patient Name: Susan Peck, Susan Peck Date: 07/20/2017 Gender: Female D.O.B: 06-13-1966 Age (years): 36 Referring Provider: Tammy Parrett Height (inches): 64 Interpreting Physician: Kara Mead MD, ABSM Weight (lbs): 188 RPSGT: Baxter Flattery BMI: 32 MRN: [id number] Neck Size: 15.00 <br> <br> CLINICAL INFORMATION The patient is referred for a CPAP titration to treat sleep apnea. She had a lapse in CPAP therapy  Date of NPSG: 2012 [facility] 16/hr.  CPAP 12cm  SLEEP STUDY TECHNIQUE As per the AASM Manual for the Scoring of Sleep and Associated Events v2.3 (April 2016) with a hypopnea requiring 4% desaturations.  The channels recorded and monitored were frontal, central and occipital EEG, electrooculogram (EOG), submentalis EMG (chin), nasal and oral airflow, thoracic and abdominal wall motion, anterior tibialis EMG, snore microphone, electrocardiogram, and pulse oximetry. Continuous positive airway pressure (CPAP) was initiated at the beginning of the study and titrated to treat sleep-disordered breathing.  MEDICATIONS Medications self-administered by patient taken the night of the study : CARVEDILOL, ENTRESTO  TECHNICIAN COMMENTS Comments added by technician: Patient had difficulty initiating sleep.  RESPIRATORY PARAMETERS Optimal PAP Pressure (cm): 9 AHI at Optimal Pressure (/hr): 0.0 Overall Minimal O2 (%): 88.00 Supine % at Optimal Pressure (%): 100 Minimal O2 at Optimal Pressure (%): 90.0   SLEEP ARCHITECTURE The study was initiated at 10:51:25 PM and ended at 5:09:32 AM.  Sleep onset time was 2.1 minutes and the sleep efficiency was 89.3%. The total sleep time was 337.5 minutes.  The patient spent 5.93% of the night in stage N1 sleep, 77.93% in stage N2 sleep, 2.67% in stage N3 and 13.48% in REM.Stage REM latency was 92.5 minutes  Wake after sleep onset was 38.6. Alpha intrusion was absent. Supine sleep was 81.48%.  CARDIAC DATA The 2 lead EKG demonstrated pacemaker generated.  The mean heart rate was 62.90 beats per minute. Other EKG findings include: None.   LEG MOVEMENT DATA The total Periodic Limb Movements of Sleep (PLMS) were 0. The PLMS index was 0.00. A PLMS index of <15 is considered normal in adults.  IMPRESSIONS - The optimal PAP pressure was 9 cm of water. - Central sleep apnea was not noted during this titration (CAI = 0.0/h). - Mild oxygen desaturations were observed during this titration (min O2 = 88.00%). - No snoring was audible during this study. - No cardiac abnormalities were observed during this study. - Clinically significant periodic limb movements were not noted during this study. Arousals associated with PLMs were rare.   DIAGNOSIS - Obstructive Sleep Apnea (327.23 [G47.33 ICD-10])   RECOMMENDATIONS - Trial of CPAP therapy on 9 cm H2O with a Small size Resmed Full Face Mask AirFit F20 mask and heated humidification. - Avoid alcohol, sedatives and other CNS depressants that may worsen sleep apnea and disrupt normal sleep architecture. - Sleep hygiene should be reviewed to assess factors that may improve sleep quality. - Weight management and regular exercise should be initiated or continued. - [facility] to [facility] for [facility]-evaluation after 4 weeks of therapy   Kara Mead MD Board Certified in [place]

## 2017-07-27 ENCOUNTER — Other Ambulatory Visit: Payer: Self-pay

## 2017-07-27 DIAGNOSIS — G4733 Obstructive sleep apnea (adult) (pediatric): Secondary | ICD-10-CM

## 2017-08-06 ENCOUNTER — Other Ambulatory Visit: Payer: Self-pay

## 2017-08-07 DIAGNOSIS — M7541 Impingement syndrome of right shoulder: Secondary | ICD-10-CM | POA: Diagnosis not present

## 2017-08-13 DIAGNOSIS — H40023 Open angle with borderline findings, high risk, bilateral: Secondary | ICD-10-CM | POA: Diagnosis not present

## 2017-08-15 ENCOUNTER — Other Ambulatory Visit (HOSPITAL_COMMUNITY): Payer: Medicare Other

## 2017-08-20 ENCOUNTER — Ambulatory Visit: Payer: Medicare Other | Admitting: Pulmonary Disease

## 2017-08-24 DIAGNOSIS — M5417 Radiculopathy, lumbosacral region: Secondary | ICD-10-CM | POA: Diagnosis not present

## 2017-08-24 DIAGNOSIS — M5415 Radiculopathy, thoracolumbar region: Secondary | ICD-10-CM | POA: Diagnosis not present

## 2017-08-24 DIAGNOSIS — M9905 Segmental and somatic dysfunction of pelvic region: Secondary | ICD-10-CM | POA: Diagnosis not present

## 2017-08-24 DIAGNOSIS — M9903 Segmental and somatic dysfunction of lumbar region: Secondary | ICD-10-CM | POA: Diagnosis not present

## 2017-08-24 DIAGNOSIS — M25511 Pain in right shoulder: Secondary | ICD-10-CM | POA: Diagnosis not present

## 2017-08-24 DIAGNOSIS — M9902 Segmental and somatic dysfunction of thoracic region: Secondary | ICD-10-CM | POA: Diagnosis not present

## 2017-08-24 DIAGNOSIS — M5137 Other intervertebral disc degeneration, lumbosacral region: Secondary | ICD-10-CM | POA: Diagnosis not present

## 2017-08-27 ENCOUNTER — Telehealth: Payer: Self-pay | Admitting: Endocrinology

## 2017-08-27 NOTE — Telephone Encounter (Signed)
She needs to make an appointment for any further refills

## 2017-08-27 NOTE — Telephone Encounter (Signed)
This medication has been refused with a note for patient to make an appointment.

## 2017-08-27 NOTE — Telephone Encounter (Signed)
Please advise if you want to continue with the same dosage. Last filled by historical provider.

## 2017-09-07 DIAGNOSIS — M9905 Segmental and somatic dysfunction of pelvic region: Secondary | ICD-10-CM | POA: Diagnosis not present

## 2017-09-07 DIAGNOSIS — M5417 Radiculopathy, lumbosacral region: Secondary | ICD-10-CM | POA: Diagnosis not present

## 2017-09-07 DIAGNOSIS — M9902 Segmental and somatic dysfunction of thoracic region: Secondary | ICD-10-CM | POA: Diagnosis not present

## 2017-09-07 DIAGNOSIS — M9903 Segmental and somatic dysfunction of lumbar region: Secondary | ICD-10-CM | POA: Diagnosis not present

## 2017-09-07 DIAGNOSIS — M5415 Radiculopathy, thoracolumbar region: Secondary | ICD-10-CM | POA: Diagnosis not present

## 2017-09-07 DIAGNOSIS — M5137 Other intervertebral disc degeneration, lumbosacral region: Secondary | ICD-10-CM | POA: Diagnosis not present

## 2017-09-19 DIAGNOSIS — M9902 Segmental and somatic dysfunction of thoracic region: Secondary | ICD-10-CM | POA: Diagnosis not present

## 2017-09-19 DIAGNOSIS — M5137 Other intervertebral disc degeneration, lumbosacral region: Secondary | ICD-10-CM | POA: Diagnosis not present

## 2017-09-19 DIAGNOSIS — M5415 Radiculopathy, thoracolumbar region: Secondary | ICD-10-CM | POA: Diagnosis not present

## 2017-09-19 DIAGNOSIS — M9905 Segmental and somatic dysfunction of pelvic region: Secondary | ICD-10-CM | POA: Diagnosis not present

## 2017-09-19 DIAGNOSIS — M9903 Segmental and somatic dysfunction of lumbar region: Secondary | ICD-10-CM | POA: Diagnosis not present

## 2017-09-19 DIAGNOSIS — M5417 Radiculopathy, lumbosacral region: Secondary | ICD-10-CM | POA: Diagnosis not present

## 2017-09-24 DIAGNOSIS — M9902 Segmental and somatic dysfunction of thoracic region: Secondary | ICD-10-CM | POA: Diagnosis not present

## 2017-09-24 DIAGNOSIS — M25511 Pain in right shoulder: Secondary | ICD-10-CM | POA: Diagnosis not present

## 2017-09-24 DIAGNOSIS — M9905 Segmental and somatic dysfunction of pelvic region: Secondary | ICD-10-CM | POA: Diagnosis not present

## 2017-09-24 DIAGNOSIS — M5415 Radiculopathy, thoracolumbar region: Secondary | ICD-10-CM | POA: Diagnosis not present

## 2017-09-24 DIAGNOSIS — M7541 Impingement syndrome of right shoulder: Secondary | ICD-10-CM | POA: Diagnosis not present

## 2017-09-24 DIAGNOSIS — M9903 Segmental and somatic dysfunction of lumbar region: Secondary | ICD-10-CM | POA: Diagnosis not present

## 2017-09-24 DIAGNOSIS — M5417 Radiculopathy, lumbosacral region: Secondary | ICD-10-CM | POA: Diagnosis not present

## 2017-09-24 DIAGNOSIS — M5137 Other intervertebral disc degeneration, lumbosacral region: Secondary | ICD-10-CM | POA: Diagnosis not present

## 2017-09-25 ENCOUNTER — Ambulatory Visit: Payer: Medicare Other | Admitting: Pulmonary Disease

## 2017-09-25 ENCOUNTER — Encounter: Payer: Self-pay | Admitting: Pulmonary Disease

## 2017-09-25 ENCOUNTER — Ambulatory Visit (INDEPENDENT_AMBULATORY_CARE_PROVIDER_SITE_OTHER): Payer: Medicare Other | Admitting: Pulmonary Disease

## 2017-09-25 ENCOUNTER — Other Ambulatory Visit (INDEPENDENT_AMBULATORY_CARE_PROVIDER_SITE_OTHER): Payer: Medicare Other

## 2017-09-25 VITALS — BP 138/84 | HR 70 | Ht 64.0 in | Wt 197.0 lb

## 2017-09-25 DIAGNOSIS — G4733 Obstructive sleep apnea (adult) (pediatric): Secondary | ICD-10-CM | POA: Diagnosis not present

## 2017-09-25 DIAGNOSIS — I5022 Chronic systolic (congestive) heart failure: Secondary | ICD-10-CM

## 2017-09-25 DIAGNOSIS — J4520 Mild intermittent asthma, uncomplicated: Secondary | ICD-10-CM

## 2017-09-25 LAB — BASIC METABOLIC PANEL
BUN: 20 mg/dL (ref 6–23)
CHLORIDE: 100 meq/L (ref 96–112)
CO2: 32 mEq/L (ref 19–32)
Calcium: 9.7 mg/dL (ref 8.4–10.5)
Creatinine, Ser: 1.07 mg/dL (ref 0.40–1.20)
GFR: 69.27 mL/min (ref >60.00)
Glucose, Bld: 301 mg/dL — ABNORMAL HIGH (ref 70–99)
Potassium: 4.2 mEq/L (ref 3.5–5.1)
SODIUM: 136 meq/L (ref 135–145)

## 2017-09-25 LAB — MAGNESIUM: MAGNESIUM: 1.8 mg/dL (ref 1.5–2.5)

## 2017-09-25 NOTE — Progress Notes (Signed)
   Subjective:    Patient ID: Susan Peck, female    DOB: 1965-08-08, 52 y.o.   MRN: [id number]  HPI  52 yo female never smoker with moderate OSA  And Intermittent Asthma . Probable ACE inhibitor cough in 2016 , ACE stopped.  Has NICM /CHF followed by Cardiology s/p ICD   Asthma symptoms are well controlled, denies nocturnal wheeze, seldom has to use albuterol. She complains of nocturnal leg cramps.  She is on Lasix twice daily and feels that her left leg is worse. She has numerous complaints about her diabetes medications and concerns which she has discussed with her endocrinologist. She received a new CPAP machine in January after CPAP titration study, preferred a full facemask.  Denies any problems with mask or pressure.  We reviewed care of the machine and how to turn it on. CPAP download was reviewed from [place] which shows excellent control of events with average pressure of 8 cm on auto settings 5-20 cm.  Compliance was poor with average of 3 hours per night and several missed nights  Significant tests/ events reviewed  2012 sleep study [facility] 16/hr. CPAP 12cm,  Titration 06/2017 CPAP 9 cm PFT 07/2014  with an FEV1 at 72%, ratio 88, no significant bronchodilator response, FVC decreased at 65%, DLCO 78% 07/2014 HRCT chest >> no evidence for ILD, small bilateral pleural effusions, right greater than left. Mild diffuse groundglass attenuation throughout the lungs most compatible with pulmonary edema/CHF  03/2017 EF improved to 45% from 20%    Review of Systems neg for any significant sore throat, dysphagia, itching, sneezing, nasal congestion or excess/ purulent secretions, fever, chills, sweats, unintended wt loss, pleuritic or exertional cp, hempoptysis, orthopnea pnd or change in chronic leg swelling. Also denies presyncope, palpitations, heartburn, abdominal pain, nausea, vomiting, diarrhea or change in bowel or urinary habits, dysuria,hematuria, rash, arthralgias, visual  complaints, headache, numbness weakness or ataxia.     Objective:   Physical Exam   Gen. Pleasant, well-nourished, in no distress ENT - no thrush, no post nasal drip Neck: No JVD, no thyromegaly, no carotid bruits Lungs: no use of accessory muscles, no dullness to percussion, clear without rales or rhonchi  Cardiovascular: Rhythm regular, heart sounds  normal, no murmurs or gallops, no peripheral edema Musculoskeletal: No deformities, no cyanosis or clubbing         Assessment & Plan:

## 2017-09-25 NOTE — Assessment & Plan Note (Signed)
Albuterol as needed only for mild intermittent symptoms which are most likely related to fluid overload

## 2017-09-25 NOTE — Assessment & Plan Note (Addendum)
Change CPAP 8 cm and recheck download in 1 month. She agrees that the machine is helping but admits to poor usage and feels she can do better   compliance with goal of at least 4-6 hrs every night is the expectation. Advised against medications with sedative side effects Cautioned against driving when sleepy - understanding that sleepiness will vary on a day to day basis

## 2017-09-25 NOTE — Assessment & Plan Note (Signed)
Check be met and magnesium level today. Nocturnal leg cramps may be related to hypokalemia or hypomagnesemia due to diuretic usage

## 2017-09-25 NOTE — Patient Instructions (Signed)
Change CPAP 8 cm and recheck download in 1 month. Compliance of at least 4-6 hours every night is expected  Blood work today to check potassium and magnesium level

## 2017-09-26 ENCOUNTER — Telehealth: Payer: Self-pay | Admitting: Cardiology

## 2017-09-26 ENCOUNTER — Other Ambulatory Visit: Payer: Self-pay | Admitting: Orthopedic Surgery

## 2017-09-26 ENCOUNTER — Ambulatory Visit (INDEPENDENT_AMBULATORY_CARE_PROVIDER_SITE_OTHER): Payer: Medicare Other | Admitting: *Deleted

## 2017-09-26 DIAGNOSIS — I428 Other cardiomyopathies: Secondary | ICD-10-CM | POA: Diagnosis not present

## 2017-09-26 DIAGNOSIS — M7541 Impingement syndrome of right shoulder: Secondary | ICD-10-CM

## 2017-09-26 NOTE — Telephone Encounter (Signed)
Spoke with pt and reminded pt of remote transmission that is due today. Pt verbalized understanding.   

## 2017-09-27 ENCOUNTER — Encounter: Payer: Self-pay | Admitting: Cardiology

## 2017-09-27 DIAGNOSIS — M5417 Radiculopathy, lumbosacral region: Secondary | ICD-10-CM | POA: Diagnosis not present

## 2017-09-27 DIAGNOSIS — M5415 Radiculopathy, thoracolumbar region: Secondary | ICD-10-CM | POA: Diagnosis not present

## 2017-09-27 DIAGNOSIS — M5137 Other intervertebral disc degeneration, lumbosacral region: Secondary | ICD-10-CM | POA: Diagnosis not present

## 2017-09-27 DIAGNOSIS — M9902 Segmental and somatic dysfunction of thoracic region: Secondary | ICD-10-CM | POA: Diagnosis not present

## 2017-09-27 DIAGNOSIS — M9905 Segmental and somatic dysfunction of pelvic region: Secondary | ICD-10-CM | POA: Diagnosis not present

## 2017-09-27 DIAGNOSIS — M9903 Segmental and somatic dysfunction of lumbar region: Secondary | ICD-10-CM | POA: Diagnosis not present

## 2017-09-27 NOTE — Progress Notes (Signed)
Remote ICD transmission.   

## 2017-09-30 LAB — CUP PACEART REMOTE DEVICE CHECK
Battery Remaining Longevity: 96 mo
Brady Statistic AP VS Percent: 0.01 %
Brady Statistic AS VP Percent: 94.38 %
Brady Statistic RA Percent Paced: 5.6 %
Brady Statistic RV Percent Paced: 99.94 %
HIGH POWER IMPEDANCE MEASURED VALUE: 81 Ohm
Implantable Lead Implant Date: 20180524
Implantable Lead Location: 753859
Implantable Lead Location: 753860
Implantable Lead Model: 5076
Implantable Pulse Generator Implant Date: 20180524
Lead Channel Impedance Value: 156.606
Lead Channel Impedance Value: 172.541
Lead Channel Impedance Value: 204.14 Ohm
Lead Channel Impedance Value: 399 Ohm
Lead Channel Impedance Value: 418 Ohm
Lead Channel Impedance Value: 456 Ohm
Lead Channel Impedance Value: 570 Ohm
Lead Channel Impedance Value: 608 Ohm
Lead Channel Impedance Value: 722 Ohm
Lead Channel Pacing Threshold Amplitude: 0.375 V
Lead Channel Pacing Threshold Pulse Width: 0.4 ms
Lead Channel Sensing Intrinsic Amplitude: 2.75 mV
Lead Channel Sensing Intrinsic Amplitude: 22.75 mV
Lead Channel Sensing Intrinsic Amplitude: 22.75 mV
Lead Channel Setting Pacing Amplitude: 1.25 V
Lead Channel Setting Pacing Amplitude: 2.5 V
Lead Channel Setting Pacing Pulse Width: 0.4 ms
Lead Channel Setting Pacing Pulse Width: 0.4 ms
MDC IDC LEAD IMPLANT DT: 20180524
MDC IDC LEAD IMPLANT DT: 20180524
MDC IDC LEAD LOCATION: 753858
MDC IDC MSMT BATTERY VOLTAGE: 3 V
MDC IDC MSMT LEADCHNL LV IMPEDANCE VALUE: 176 Ohm
MDC IDC MSMT LEADCHNL LV IMPEDANCE VALUE: 182.205
MDC IDC MSMT LEADCHNL LV IMPEDANCE VALUE: 304 Ohm
MDC IDC MSMT LEADCHNL LV IMPEDANCE VALUE: 323 Ohm
MDC IDC MSMT LEADCHNL LV IMPEDANCE VALUE: 532 Ohm
MDC IDC MSMT LEADCHNL LV IMPEDANCE VALUE: 627 Ohm
MDC IDC MSMT LEADCHNL LV IMPEDANCE VALUE: 627 Ohm
MDC IDC MSMT LEADCHNL LV IMPEDANCE VALUE: 665 Ohm
MDC IDC MSMT LEADCHNL LV PACING THRESHOLD AMPLITUDE: 0.625 V
MDC IDC MSMT LEADCHNL RA IMPEDANCE VALUE: 399 Ohm
MDC IDC MSMT LEADCHNL RA PACING THRESHOLD AMPLITUDE: 0.75 V
MDC IDC MSMT LEADCHNL RA PACING THRESHOLD PULSEWIDTH: 0.4 ms
MDC IDC MSMT LEADCHNL RA SENSING INTR AMPL: 2.75 mV
MDC IDC MSMT LEADCHNL RV PACING THRESHOLD PULSEWIDTH: 0.4 ms
MDC IDC SESS DTM: 20190307022714
MDC IDC SET LEADCHNL RA PACING AMPLITUDE: 1.75 V
MDC IDC SET LEADCHNL RV SENSING SENSITIVITY: 0.3 mV
MDC IDC STAT BRADY AP VP PERCENT: 5.58 %
MDC IDC STAT BRADY AS VS PERCENT: 0.03 %

## 2017-10-01 DIAGNOSIS — M5415 Radiculopathy, thoracolumbar region: Secondary | ICD-10-CM | POA: Diagnosis not present

## 2017-10-01 DIAGNOSIS — M9903 Segmental and somatic dysfunction of lumbar region: Secondary | ICD-10-CM | POA: Diagnosis not present

## 2017-10-01 DIAGNOSIS — M9905 Segmental and somatic dysfunction of pelvic region: Secondary | ICD-10-CM | POA: Diagnosis not present

## 2017-10-01 DIAGNOSIS — M5137 Other intervertebral disc degeneration, lumbosacral region: Secondary | ICD-10-CM | POA: Diagnosis not present

## 2017-10-01 DIAGNOSIS — M9902 Segmental and somatic dysfunction of thoracic region: Secondary | ICD-10-CM | POA: Diagnosis not present

## 2017-10-01 DIAGNOSIS — M5417 Radiculopathy, lumbosacral region: Secondary | ICD-10-CM | POA: Diagnosis not present

## 2017-10-08 DIAGNOSIS — M5415 Radiculopathy, thoracolumbar region: Secondary | ICD-10-CM | POA: Diagnosis not present

## 2017-10-08 DIAGNOSIS — M9903 Segmental and somatic dysfunction of lumbar region: Secondary | ICD-10-CM | POA: Diagnosis not present

## 2017-10-08 DIAGNOSIS — M9902 Segmental and somatic dysfunction of thoracic region: Secondary | ICD-10-CM | POA: Diagnosis not present

## 2017-10-08 DIAGNOSIS — M5417 Radiculopathy, lumbosacral region: Secondary | ICD-10-CM | POA: Diagnosis not present

## 2017-10-08 DIAGNOSIS — M9905 Segmental and somatic dysfunction of pelvic region: Secondary | ICD-10-CM | POA: Diagnosis not present

## 2017-10-08 DIAGNOSIS — M5137 Other intervertebral disc degeneration, lumbosacral region: Secondary | ICD-10-CM | POA: Diagnosis not present

## 2017-10-11 DIAGNOSIS — M5417 Radiculopathy, lumbosacral region: Secondary | ICD-10-CM | POA: Diagnosis not present

## 2017-10-11 DIAGNOSIS — M9903 Segmental and somatic dysfunction of lumbar region: Secondary | ICD-10-CM | POA: Diagnosis not present

## 2017-10-11 DIAGNOSIS — M5415 Radiculopathy, thoracolumbar region: Secondary | ICD-10-CM | POA: Diagnosis not present

## 2017-10-11 DIAGNOSIS — M5137 Other intervertebral disc degeneration, lumbosacral region: Secondary | ICD-10-CM | POA: Diagnosis not present

## 2017-10-11 DIAGNOSIS — M9902 Segmental and somatic dysfunction of thoracic region: Secondary | ICD-10-CM | POA: Diagnosis not present

## 2017-10-11 DIAGNOSIS — M9905 Segmental and somatic dysfunction of pelvic region: Secondary | ICD-10-CM | POA: Diagnosis not present

## 2017-10-15 ENCOUNTER — Ambulatory Visit
Admission: RE | Admit: 2017-10-15 | Discharge: 2017-10-15 | Disposition: A | Discharge status: Home / Self Care | Payer: Medicare Other | Source: Ambulatory Visit | Attending: Orthopedic Surgery | Admitting: Orthopedic Surgery

## 2017-10-15 DIAGNOSIS — M7541 Impingement syndrome of right shoulder: Secondary | ICD-10-CM

## 2017-10-15 DIAGNOSIS — S43431A Superior glenoid labrum lesion of right shoulder, initial encounter: Secondary | ICD-10-CM | POA: Diagnosis not present

## 2017-10-15 DIAGNOSIS — M25511 Pain in right shoulder: Secondary | ICD-10-CM | POA: Diagnosis not present

## 2017-10-15 MED ORDER — IOPAMIDOL (ISOVUE-M 200) INJECTION 41%
15.0000 mL | Freq: Once | INTRAMUSCULAR | Status: AC
  Administered 2017-10-15: 15 mL via INTRA_ARTICULAR

## 2017-10-16 ENCOUNTER — Ambulatory Visit (HOSPITAL_COMMUNITY)
Admission: RE | Admit: 2017-10-16 | Discharge: 2017-10-16 | Disposition: A | Discharge status: Home / Self Care | Payer: Medicare Other | Source: Ambulatory Visit | Attending: Cardiovascular Disease | Admitting: Cardiovascular Disease

## 2017-10-16 ENCOUNTER — Encounter: Payer: Self-pay | Admitting: Internal Medicine

## 2017-10-16 ENCOUNTER — Encounter (HOSPITAL_COMMUNITY): Payer: Self-pay | Admitting: Internal Medicine

## 2017-10-16 ENCOUNTER — Encounter: Payer: Medicare Other | Admitting: Internal Medicine

## 2017-10-16 ENCOUNTER — Ambulatory Visit (HOSPITAL_BASED_OUTPATIENT_CLINIC_OR_DEPARTMENT_OTHER)
Admission: RE | Admit: 2017-10-16 | Discharge: 2017-10-16 | Disposition: A | Discharge status: Home / Self Care | Payer: Medicare Other | Source: Ambulatory Visit | Attending: Internal Medicine | Admitting: Internal Medicine

## 2017-10-16 VITALS — BP 124/84 | HR 64 | Wt 191.0 lb

## 2017-10-16 DIAGNOSIS — Z91013 Allergy to seafood: Secondary | ICD-10-CM | POA: Diagnosis not present

## 2017-10-16 DIAGNOSIS — E119 Type 2 diabetes mellitus without complications: Secondary | ICD-10-CM | POA: Diagnosis not present

## 2017-10-16 DIAGNOSIS — Z8619 Personal history of other infectious and parasitic diseases: Secondary | ICD-10-CM | POA: Insufficient documentation

## 2017-10-16 DIAGNOSIS — I110 Hypertensive heart disease with heart failure: Secondary | ICD-10-CM | POA: Diagnosis not present

## 2017-10-16 DIAGNOSIS — G4733 Obstructive sleep apnea (adult) (pediatric): Secondary | ICD-10-CM | POA: Diagnosis not present

## 2017-10-16 DIAGNOSIS — Z9889 Other specified postprocedural states: Secondary | ICD-10-CM | POA: Insufficient documentation

## 2017-10-16 DIAGNOSIS — Z95810 Presence of automatic (implantable) cardiac defibrillator: Secondary | ICD-10-CM | POA: Insufficient documentation

## 2017-10-16 DIAGNOSIS — E669 Obesity, unspecified: Secondary | ICD-10-CM | POA: Insufficient documentation

## 2017-10-16 DIAGNOSIS — Z7982 Long term (current) use of aspirin: Secondary | ICD-10-CM | POA: Insufficient documentation

## 2017-10-16 DIAGNOSIS — E785 Hyperlipidemia, unspecified: Secondary | ICD-10-CM | POA: Insufficient documentation

## 2017-10-16 DIAGNOSIS — Z8349 Family history of other endocrine, nutritional and metabolic diseases: Secondary | ICD-10-CM | POA: Diagnosis not present

## 2017-10-16 DIAGNOSIS — E042 Nontoxic multinodular goiter: Secondary | ICD-10-CM | POA: Insufficient documentation

## 2017-10-16 DIAGNOSIS — Z8249 Family history of ischemic heart disease and other diseases of the circulatory system: Secondary | ICD-10-CM | POA: Insufficient documentation

## 2017-10-16 DIAGNOSIS — Z833 Family history of diabetes mellitus: Secondary | ICD-10-CM | POA: Diagnosis not present

## 2017-10-16 DIAGNOSIS — J45909 Unspecified asthma, uncomplicated: Secondary | ICD-10-CM | POA: Insufficient documentation

## 2017-10-16 DIAGNOSIS — Z79899 Other long term (current) drug therapy: Secondary | ICD-10-CM | POA: Insufficient documentation

## 2017-10-16 DIAGNOSIS — Z6832 Body mass index (BMI) 32.0-32.9, adult: Secondary | ICD-10-CM | POA: Diagnosis not present

## 2017-10-16 DIAGNOSIS — Z90710 Acquired absence of both cervix and uterus: Secondary | ICD-10-CM | POA: Insufficient documentation

## 2017-10-16 DIAGNOSIS — I5022 Chronic systolic (congestive) heart failure: Secondary | ICD-10-CM

## 2017-10-16 DIAGNOSIS — Z7984 Long term (current) use of oral hypoglycemic drugs: Secondary | ICD-10-CM | POA: Insufficient documentation

## 2017-10-16 DIAGNOSIS — K219 Gastro-esophageal reflux disease without esophagitis: Secondary | ICD-10-CM | POA: Insufficient documentation

## 2017-10-16 DIAGNOSIS — I447 Left bundle-branch block, unspecified: Secondary | ICD-10-CM | POA: Insufficient documentation

## 2017-10-16 DIAGNOSIS — K589 Irritable bowel syndrome without diarrhea: Secondary | ICD-10-CM | POA: Insufficient documentation

## 2017-10-16 DIAGNOSIS — I429 Cardiomyopathy, unspecified: Secondary | ICD-10-CM | POA: Diagnosis not present

## 2017-10-16 NOTE — Patient Instructions (Signed)
Take extra Furosemide (Lasix) in the evening for the next 1-2 days  We will contact you in 6 months to schedule your next appointment.

## 2017-10-16 NOTE — Progress Notes (Signed)
  Echocardiogram 2D Echocardiogram has been performed.  Susan Peck T Pasco Marchitto 10/16/2017, 11:52 AM

## 2017-10-16 NOTE — Progress Notes (Signed)
Patient ID: Susan Peck, female   DOB: 1966-06-13, 52 y.o.   MRN: [id number]   Advanced Heart Failure Clinic Note   PCP: Dr Rae Mar GYN: Dr Carolin Guernsey Pulmonologist: Dr Gwenette Greet HF: Dr. Haroldine Laws   HPI:   Susan Peck is a 52 year old African American female with systolic heart failure, HTN, GERD, 11/23/11 S/P Hysterectomy, multinodular goiter and asthma.   2004 cardiac cath by Dr Lynnell Jude in [place], [place] with one blockage noted - no stent. EF said to be normal. About 1 year ago at [facility] noted murmur and she was referred to Dr Tamala Julian EF 25-30%. More recently, she has been followed in CHF clinic.   She presents today for follow up with Echo. She has been OK overall. Her major complaint is pain in her legs, which is worse at night. It does come along at times which she is walking. She was walking around at the beach and having severe cramps after walking for some time. Mostly in her thighs, calves, and even feet. She is back on CPAP and wearing it most nights. Feels better on this. Breathing has been OK. Weight up, but had salty food over the weekend at the beach. Denies orthopnea or PND. She stopped Jardiance due to stomach upset (pain and nausea). She states she last took it last month.   ICD: Thoracic impedence trending towards volume. No VT/VF. Active for > 3 hrs a day.   Echo today shows EF improved to ~45-50%., GLS -14.8  04/03/2011  ECHO EF 25-30% 06/26/2012 ECHO EF 30-35%  02/29/2012 ECHO EF 35-40%  11/18/12 ECHO EF 55% Inferior wall mildly hypokinetic with septal HK Myoview 02/03/10 EF 42% No ischemia Septal and apical hypokinesis 11/14 Cardiac MRI  EF 52%- septal bounce suggestive of LBBB. Dyssynchrony from LBBB leads to the low calculated EF. Normal RV size and systolic function. No myocardial delayed enhancement, so no definite evidence for prior myocardial infarction, myocarditis, or infiltrative disease. 1/16 CT chest - small pleural effusions with early pulmonary edema. No  ILD. 09/03/2014: ECHO EF 20-25%  6/16: ECHO EF 35-40% Cardiolite (6/16): EF 36%, no ischemia/infarction.  09/07/2015: ECHO EF 30-35%   CPX 11/20/2014  Peak VO2: 16.1 (76.8% predicted peak VO2) - when corrected to ideal BW pVO2 is 22.9 VE/VCO2 slope: 28.5 OUES: 1.59 Peak RER: 1.18  LHC/RHC 09/15/2015  RA = 2 RV = 22/1/2 PA = 27/8 (19) PCW = 8 Fick cardiac output/index = 5.22/2.8 PVR = 2.1 Ao sat = 98% PA sat = 70%, 71% Assessment: 1. Normal coronary arteries 2. Normal hemodynamics  3. LVEF 35-40% with global HK due to   Labs 08/19/14: K 4.1 Creatinine 0.62   Labs 10/28/2014: K 4.3 Creatinine 0.73  Labs 11/19/2014: K 3.6 Creatinine 0.64  Labs 6/16: digoxin 0.5 Labs 7/16: HIV negative, TSH normal Lipids 8/16: TC 206, TG 133, HDL 29, LDL 150 Labs 08/18/2015: 9.1  Labs 09/07/2015: K 4.4 Creating 0.68  Lab 12/14/2015: K 3.7 Creatinine 0.71   Review of systems complete and found to be negative unless listed in HPI.    Past Medical History:  Diagnosis Date  . AICD (automatic cardioverter/defibrillator) present 12/13/2016  . Anxiety    no meds  . Asthma   . CHF (congestive heart failure) ([place])   . Depression    no meds  . Diabetes mellitus    recent dx 11/17/11 - started med 11/18/11 type 2  . GERD (gastroesophageal reflux disease)   . Goiter 07/27/2011   non-neoplastic  goiter - fine needle aspiration - benign sees dr Dwyane Dee for  . Heart murmur    dx 2 yrs ago per pt  . Herpes genitalis in women   . Hypertension   . IBS (irritable bowel syndrome)    tx with diet per pt  . Low back pain    history  . Obesity   . Seasonal allergies   . Shortness of breath    occasional - exercise induced  . Sleep apnea    cpap broken  . Systolic heart failure    May 2011 EF 35-40%, 04/03/11 EF 25-30%, 06/27/11 EF 30-35%   Current Outpatient Medications  Medication Sig Dispense Refill  . albuterol (PROVENTIL HFA;VENTOLIN HFA) 108 (90 Base) MCG/ACT inhaler Inhale 1-2 puffs into the lungs  every 6 (six) hours as needed for wheezing or shortness of breath. 1 Inhaler 0  . aspirin 81 MG chewable tablet Chew 81 mg by mouth daily.     Marland Kitchen BAYER CONTOUR TEST test strip   1  . carvedilol (COREG) 25 MG tablet Take 1 tablet (25 mg total) by mouth 2 (two) times daily with a meal. 60 tablet 11  . diclofenac sodium (VOLTAREN) 1 % GEL Apply 2 g 4 (four) times daily topically. 100 g 1  . DULoxetine (CYMBALTA) 30 MG capsule Take 1 capsule (30 mg total) by mouth daily. Take with food 30 capsule 3  . empagliflozin (JARDIANCE) 10 MG TABS tablet Take 10 mg by mouth daily with breakfast. Dx Code E11.65 30 tablet 3  . eplerenone (INSPRA) 25 MG tablet Take 0.5 tablets (12.5 mg total) by mouth daily. 15 tablet 3  . fluticasone (FLONASE) 50 MCG/ACT nasal spray Place 1 spray into both nostrils 2 (two) times daily as needed for allergies or rhinitis. (Patient taking differently: Place 1 spray into both nostrils daily as needed for allergies or rhinitis. ) 16 g 3  . furosemide (LASIX) 40 MG tablet Take 2 tabs in AM and 1 tab in PM 90 tablet 3  . glimepiride (AMARYL) 2 MG tablet Take 2 mg by mouth daily with breakfast.     . ketoconazole (NIZORAL) 2 % cream Apply 1 application topically daily.  2  . loratadine (CLARITIN) 10 MG tablet Take 1 tablet (10 mg total) by mouth daily. (Patient taking differently: Take 10 mg by mouth daily as needed for allergies. ) 30 tablet 2  . meloxicam (MOBIC) 15 MG tablet Take 1 tablet (15 mg total) by mouth daily. 15 tablet 0  . metFORMIN (GLUCOPHAGE-XR) 500 MG 24 hr tablet Take 500 mg by mouth daily with breakfast.    . oxyCODONE-acetaminophen (ROXICET) 5-325 MG tablet Take 1 tablet by mouth every 6 (six) hours as needed for severe pain. 6 tablet 0  . pantoprazole (PROTONIX) 40 MG tablet Take 1 tablet (40 mg total) by mouth daily. 60 tablet 0  . PATADAY 0.2 % SOLN Place 1 drop into both eyes daily.  4  . PRESCRIPTION MEDICATION Inhale into the lungs at bedtime. CPAP    .  ranitidine (ZANTAC) 150 MG tablet Take 1 tablet (150 mg total) by mouth 2 (two) times daily. 180 tablet 3  . sacubitril-valsartan (ENTRESTO) 97-103 MG Take 1 tablet by mouth 2 (two) times daily. 60 tablet 11  . sodium chloride (OCEAN) 0.65 % SOLN nasal spray Place 2 sprays into both nostrils daily as needed for congestion. 15 mL 1  . terconazole (TERAZOL 7) 0.4 % vaginal cream Place 1 applicator vaginally at bedtime.  45 g 0   No current facility-administered medications for this encounter.    Facility-Administered Medications Ordered in Other Encounters  Medication Dose Route Frequency Provider Last Rate Last Dose  . aminophylline injection 150 mg  150 mg Intravenous BID PRN Larey Dresser, MD   150 mg at 12/24/14 1005   Allergies  Allergen Reactions  . Shrimp [Shellfish Allergy] Anaphylaxis   Social History   Socioeconomic History  . Marital status: Divorced    Spouse name: Not on file  . Number of children: Y  . Years of education: Not on file  . Highest education level: Not on file  Occupational History  . Occupation: Surveyor, quantity: [employer]    Comment: and Systems analyst  Social Needs  . Financial resource strain: Not on file  . Food insecurity:    Worry: Not on file    Inability: Not on file  . Transportation needs:    Medical: Not on file    Non-medical: Not on file  Tobacco Use  . Smoking status: Never Smoker  . Smokeless tobacco: Never Used  Substance and Sexual Activity  . Alcohol use: No    Alcohol/week: 0.0 oz  . Drug use: No  . Sexual activity: Never    Birth control/protection: Surgical    Comment: des neg, intercourse age 39, more than sexual parterns  Lifestyle  . Physical activity:    Days per week: Not on file    Minutes per session: Not on file  . Stress: Not on file  Relationships  . Social connections:    Talks on phone: Not on file    Gets together: Not on file    Attends religious service: Not on file    Active member of club  or organization: Not on file    Attends meetings of clubs or organizations: Not on file    Relationship status: Not on file  . Intimate partner violence:    Fear of current or ex partner: Not on file    Emotionally abused: Not on file    Physically abused: Not on file    Forced sexual activity: Not on file  Other Topics Concern  . Not on file  Social History Narrative   She lives with her husband and son.  She is works for [employer].   Family History  Problem Relation Age of Onset  . Heart disease Maternal Aunt   . Breast cancer Mother   . Thyroid disease Mother   . Hypertension Maternal Grandmother   . Diabetes Maternal Grandmother   . Breast cancer Maternal Aunt   . Heart disease Maternal Aunt   . Diabetes Maternal Grandfather   . Diabetes Paternal Grandmother   . Diabetes Paternal Grandfather   . Hypertension Paternal Grandfather    PHYSICAL EXAM: Vitals:   10/16/17 1154  BP: 124/84  Pulse: 64  SpO2: 100%  Weight: 191 lb (86.6 kg)   Wt Readings from Last 3 Encounters:  10/16/17 191 lb (86.6 kg)  09/25/17 197 lb (89.4 kg)  07/20/17 188 lb (85.3 kg)   General: Well appearing. No resp difficulty. HEENT: Normal Neck: Supple. JVP ~7-8 cm. Carotids 2+ bilat; no bruits. No thyromegaly or nodule noted. Cor: PMI nondisplaced. RRR, No M/G/R noted Lungs: CTAB, normal effort. Abdomen: Soft, non-tender, non-distended, no HSM. No bruits or masses. +BS  Extremities: No cyanosis, clubbing, or rash. Trace ankle edema.  Neuro: Alert & orientedx3, cranial nerves grossly intact. moves all 4 extremities  w/o difficulty. Affect pleasant   ASSESSMENT & PLAN: 1. Chronic systolic HF: Nonischemic cardiomyopathy.  08/2015 ECHO per Dr Haroldine Laws EF 35-40%, outside ICD range (similar to cMRI).  - Echo today shows LVEF 45-50%. She doesn't qualify for CRT-D. Personally reviewed by Dr. Haroldine Laws  - NYHA II symptoms - Volume status stable on exam and ICD.  - Continue lasix 80 mg q am and 40 mg  pm.  - Continue inspra 12.5 mg daily. ( Intolerant to spiro with painful gynecomastia) - Continue coreg 25 mg BID.  - Can stay off dig.    - Intolerant to Bidil with severe headaches.  - Continue entresto 97/103 mg BID.  - Chronic LBBB. Last EKG 132 ms (03/2017)  2. Chest pressure - No s/s of ischemia. LHC 08/2015 with normal cors.   3. HTN:  - BP stable on exam.  4. OSA:  - Encouraged nightly compliance.  5. Multinodular goiter:     - Follows with endocrinology.  6. Hyperlipidemia:  - Encouraged healthy lifestyle with exercise and weight loss.  - Continue atorvastatin 40 mg daily.   7. DM II: followed by  Endocrinologist.  - Sees PCP today.  - She stopped Jardiance on her own.    EF continues to improve. Meds as above. Sees PCP today. Recent labs stable.   Shirley Friar, PA-C  10/16/2017   Patient seen and examined with the above-signed Advanced Practice Provider and/or Housestaff. I personally reviewed laboratory data, imaging studies and relevant notes. I independently examined the patient and formulated the important aspects of the plan. I have edited the note to reflect any of my changes or salient points. I have personally discussed the plan with the patient and/or family.  Echo reviewed personally. EF up to 45-50%. NYHA I-II. Will no longer qualify for CRT upgrade. Volume status mildly elevated on exam and ICD interrogation. Will have her take extra lasix today. Reinforced need for daily weights and reviewed use of sliding scale diuretics.  ICD interrogated personally. No VT/AF. Activity level looks great.   Glori Bickers, MD  1:40 PM

## 2017-10-19 DIAGNOSIS — M5137 Other intervertebral disc degeneration, lumbosacral region: Secondary | ICD-10-CM | POA: Diagnosis not present

## 2017-10-19 DIAGNOSIS — M5417 Radiculopathy, lumbosacral region: Secondary | ICD-10-CM | POA: Diagnosis not present

## 2017-10-19 DIAGNOSIS — M5415 Radiculopathy, thoracolumbar region: Secondary | ICD-10-CM | POA: Diagnosis not present

## 2017-10-19 DIAGNOSIS — M9903 Segmental and somatic dysfunction of lumbar region: Secondary | ICD-10-CM | POA: Diagnosis not present

## 2017-10-19 DIAGNOSIS — M9902 Segmental and somatic dysfunction of thoracic region: Secondary | ICD-10-CM | POA: Diagnosis not present

## 2017-10-19 DIAGNOSIS — M9905 Segmental and somatic dysfunction of pelvic region: Secondary | ICD-10-CM | POA: Diagnosis not present

## 2017-10-26 ENCOUNTER — Other Ambulatory Visit (HOSPITAL_COMMUNITY): Payer: Self-pay | Admitting: Internal Medicine

## 2017-10-29 ENCOUNTER — Encounter: Payer: Self-pay | Admitting: Pulmonary Disease

## 2017-10-31 ENCOUNTER — Telehealth: Payer: Self-pay | Admitting: Internal Medicine

## 2017-10-31 DIAGNOSIS — M5137 Other intervertebral disc degeneration, lumbosacral region: Secondary | ICD-10-CM | POA: Diagnosis not present

## 2017-10-31 DIAGNOSIS — M9902 Segmental and somatic dysfunction of thoracic region: Secondary | ICD-10-CM | POA: Diagnosis not present

## 2017-10-31 DIAGNOSIS — M9905 Segmental and somatic dysfunction of pelvic region: Secondary | ICD-10-CM | POA: Diagnosis not present

## 2017-10-31 DIAGNOSIS — M5417 Radiculopathy, lumbosacral region: Secondary | ICD-10-CM | POA: Diagnosis not present

## 2017-10-31 DIAGNOSIS — M9903 Segmental and somatic dysfunction of lumbar region: Secondary | ICD-10-CM | POA: Diagnosis not present

## 2017-10-31 DIAGNOSIS — M5415 Radiculopathy, thoracolumbar region: Secondary | ICD-10-CM | POA: Diagnosis not present

## 2017-10-31 NOTE — Telephone Encounter (Signed)
Pt calls today requesting documentation and a form filled out to show that she is disabled. She stated Dr Haroldine Laws told her she is no longer considered disabled because of some recovery in her EF. She was inquiring if Dr Caryl Comes could sign her documents. I told her Dr Caryl Comes may not be the appropriate Dr to ask. He only sees her for yearly follow ups at this time. I suggested she speak with her PCP or another physician who sees her more regularly. They would be better able to assess her abilities/disabilities. She verbalized understanding.

## 2017-10-31 NOTE — Telephone Encounter (Signed)
New message  Pt verbalized that she is calling for RN  She need a form filled out for her taxed and she said that the form it stating that she is disabled   She said that she will drop it off she said today or tomorrow

## 2017-11-07 DIAGNOSIS — M9905 Segmental and somatic dysfunction of pelvic region: Secondary | ICD-10-CM | POA: Diagnosis not present

## 2017-11-07 DIAGNOSIS — M9902 Segmental and somatic dysfunction of thoracic region: Secondary | ICD-10-CM | POA: Diagnosis not present

## 2017-11-07 DIAGNOSIS — M5417 Radiculopathy, lumbosacral region: Secondary | ICD-10-CM | POA: Diagnosis not present

## 2017-11-07 DIAGNOSIS — M5137 Other intervertebral disc degeneration, lumbosacral region: Secondary | ICD-10-CM | POA: Diagnosis not present

## 2017-11-07 DIAGNOSIS — M5415 Radiculopathy, thoracolumbar region: Secondary | ICD-10-CM | POA: Diagnosis not present

## 2017-11-07 DIAGNOSIS — M9903 Segmental and somatic dysfunction of lumbar region: Secondary | ICD-10-CM | POA: Diagnosis not present

## 2017-11-18 ENCOUNTER — Other Ambulatory Visit: Payer: Self-pay | Admitting: Internal Medicine

## 2017-11-26 DIAGNOSIS — Z83511 Family history of glaucoma: Secondary | ICD-10-CM | POA: Diagnosis not present

## 2017-11-26 DIAGNOSIS — H40053 Ocular hypertension, bilateral: Secondary | ICD-10-CM | POA: Diagnosis not present

## 2017-11-26 DIAGNOSIS — H40023 Open angle with borderline findings, high risk, bilateral: Secondary | ICD-10-CM | POA: Diagnosis not present

## 2017-11-28 ENCOUNTER — Ambulatory Visit (INDEPENDENT_AMBULATORY_CARE_PROVIDER_SITE_OTHER): Payer: Medicare Other | Admitting: Adult Health

## 2017-11-28 ENCOUNTER — Encounter: Payer: Self-pay | Admitting: Adult Health

## 2017-11-28 DIAGNOSIS — G4733 Obstructive sleep apnea (adult) (pediatric): Secondary | ICD-10-CM

## 2017-11-28 DIAGNOSIS — J324 Chronic pansinusitis: Secondary | ICD-10-CM | POA: Diagnosis not present

## 2017-11-28 DIAGNOSIS — J4530 Mild persistent asthma, uncomplicated: Secondary | ICD-10-CM

## 2017-11-28 NOTE — Progress Notes (Signed)
@Patient  ID: Susan Peck, female    DOB: 23-Oct-1965, 52 y.o.   MRN: [id number]  Chief Complaint  Patient presents with  . Follow-up    OSA    Referring provider: Shela Leff, MD  HPI: 52 yo female never smoker with moderate OSA  And Intermittent Asthma . Probable ACE inhibitor cough in 2016 , ACE stopped.  Has NICM /CHF followed by Cardiology s/p ICD    TEST /Events  2012 sleep study [facility] 16/hr. CPAP 12cm,  PFT on August 07 2014  with an FEV1 at 72%, ratio 88, no significant bronchodilator response, FVC decreased at 65%, DLCO 78% High-resolution CT chest showed no evidence for ILD, small bilateral pleural effusions, right greater than left. Mild diffuse groundglass attenuation throughout the lungs most compatible with pulmonary edema/CHF  Last echo showed 2014 with EF 60%, DD gr 1 , (prev EF 30% on prior echos )  >07/2014 ACE stopped. , lasix restarted.    11/28/2017 Follow up : OSA , Asthma  Patient returns for a 23-month follow-up.  Patient has underlying sleep apnea is on CPAP. Last visit patient CPAP pressure was changed to 8.  Patient said pressure setting is okay.  Does complain of a dry mouth and frequent leaks.  She has a full facemask.  Download shows excellent compliance with average usage at 4 hours.  Patient is on CPAP based on was H2O.  AHI 0.8.  Minimum leaks.  Patient has some mild intermittent asthma.  She denies any increased cough or wheezing.  She uses albuterol as needed.  Rare albuterol use  Patient has some chronic rhinitis.  She uses Flonase and Claritin as needed.     Allergies  Allergen Reactions  . Shrimp [Shellfish Allergy] Anaphylaxis     There is no immunization history on file for this patient.  Past Medical History:  Diagnosis Date  . AICD (automatic cardioverter/defibrillator) present 12/13/2016  . Anxiety    no meds  . Asthma   . CHF (congestive heart failure) ([place])   . Depression    no meds  . Diabetes mellitus    recent dx 11/17/11 - started med 11/18/11 type 2  . GERD (gastroesophageal reflux disease)   . Goiter 07/27/2011   non-neoplastic goiter - fine needle aspiration - benign sees dr Dwyane Dee for  . Heart murmur    dx 2 yrs ago per pt  . Herpes genitalis in women   . Hypertension   . IBS (irritable bowel syndrome)    tx with diet per pt  . Low back pain    history  . Obesity   . Seasonal allergies   . Shortness of breath    occasional - exercise induced  . Sleep apnea    cpap broken  . Systolic heart failure    May 2011 EF 35-40%, 04/03/11 EF 25-30%, 06/27/11 EF 30-35%    Tobacco History: Social History   Tobacco Use  Smoking Status Never Smoker  Smokeless Tobacco Never Used   Counseling given: Not Answered   Outpatient Encounter Medications as of 11/28/2017  Medication Sig  . albuterol (PROVENTIL HFA;VENTOLIN HFA) 108 (90 Base) MCG/ACT inhaler Inhale 1-2 puffs into the lungs every 6 (six) hours as needed for wheezing or shortness of breath.  Marland Kitchen aspirin 81 MG chewable tablet Chew 81 mg by mouth daily.   Marland Kitchen BAYER CONTOUR TEST test strip   . carvedilol (COREG) 25 MG tablet Take 1 tablet (25 mg total) by mouth 2 (two) times  daily with a meal.  . diclofenac sodium (VOLTAREN) 1 % GEL Apply 2 g 4 (four) times daily topically.  . DULoxetine (CYMBALTA) 30 MG capsule Take 1 capsule (30 mg total) by mouth daily. Take with food  . eplerenone (INSPRA) 25 MG tablet TAKE 0.5 TABLETS (12.5 MG TOTAL) BY MOUTH DAILY.  . fluticasone (FLONASE) 50 MCG/ACT nasal spray Place 1 spray into both nostrils 2 (two) times daily.  . furosemide (LASIX) 40 MG tablet Take 2 tablets (80 mg total) by mouth every morning AND 1 tablet (40 mg total) every evening.  Marland Kitchen ketoconazole (NIZORAL) 2 % cream Apply 1 application topically daily.  . meloxicam (MOBIC) 15 MG tablet Take 1 tablet (15 mg total) by mouth daily.  Marland Kitchen oxyCODONE-acetaminophen (ROXICET) 5-325 MG tablet Take 1 tablet by mouth every 6 (six) hours as needed for  severe pain.  . pantoprazole (PROTONIX) 40 MG tablet Take 1 tablet (40 mg total) by mouth daily.  Marland Kitchen PATADAY 0.2 % SOLN Place 1 drop into both eyes daily.  Marland Kitchen PRESCRIPTION MEDICATION Inhale into the lungs at bedtime. CPAP  . sacubitril-valsartan (ENTRESTO) 97-103 MG Take 1 tablet by mouth 2 (two) times daily.  . sodium chloride (OCEAN) 0.65 % SOLN nasal spray Place 2 sprays into both nostrils daily as needed for congestion.  Marland Kitchen terconazole (TERAZOL 7) 0.4 % vaginal cream Place 1 applicator vaginally at bedtime.  . empagliflozin (JARDIANCE) 10 MG TABS tablet Take 10 mg by mouth daily with breakfast. Dx Code E11.65 (Patient not taking: Reported on 11/28/2017)  . glimepiride (AMARYL) 2 MG tablet Take 2 mg by mouth daily with breakfast.   . loratadine (CLARITIN) 10 MG tablet Take 1 tablet (10 mg total) by mouth daily. (Patient taking differently: Take 10 mg by mouth daily as needed for allergies. )  . metFORMIN (GLUCOPHAGE-XR) 500 MG 24 hr tablet Take 500 mg by mouth daily with breakfast.  . ranitidine (ZANTAC) 150 MG tablet Take 1 tablet (150 mg total) by mouth 2 (two) times daily.  . [DISCONTINUED] fluticasone (FLONASE) 50 MCG/ACT nasal spray Place 1 spray into both nostrils 2 (two) times daily as needed for allergies or rhinitis. (Patient taking differently: Place 1 spray into both nostrils daily as needed for allergies or rhinitis. )  . [DISCONTINUED] furosemide (LASIX) 40 MG tablet Take 2 tabs in AM and 1 tab in PM   Facility-Administered Encounter Medications as of 11/28/2017  Medication  . aminophylline injection 150 mg     Review of Systems  Constitutional:   No  weight loss, night sweats,  Fevers, chills, fatigue, or  lassitude.  HEENT:   No headaches,  Difficulty swallowing,  Tooth/dental problems, or  Sore throat,                No sneezing, itching, ear ache,  +nasal congestion, post nasal drip,   CV:  No chest pain,  Orthopnea, PND, swelling in lower extremities, anasarca, dizziness,  palpitations, syncope.   GI  No heartburn, indigestion, abdominal pain, nausea, vomiting, diarrhea, change in bowel habits, loss of appetite, bloody stools.   Resp:  No chest wall deformity  Skin: no rash or lesions.  GU: no dysuria, change in color of urine, no urgency or frequency.  No flank pain, no hematuria   MS:  No joint pain or swelling.  No decreased range of motion.  No back pain.    Physical Exam  BP 136/84 (BP Location: Left Arm, Cuff Size: Normal)   Pulse 72  Ht 5\' 4"  (1.626 m)   Wt 191 lb (86.6 kg)   SpO2 99%   BMI 32.79 kg/m   GEN: A/Ox3; pleasant , NAD, obese    HEENT:  [place]/AT,  EACs-clear, TMs-wnl, NOSE-clear, THROAT-clear, no lesions, no postnasal drip or exudate noted.   NECK:  Supple w/ fair ROM; no JVD; normal carotid impulses w/o bruits; no thyromegaly or nodules palpated; no lymphadenopathy.    RESP  Clear  P & A; w/o, wheezes/ rales/ or rhonchi. no accessory muscle use, no dullness to percussion  CARD:  RRR, no m/r/g, tr peripheral edema, pulses intact, no cyanosis or clubbing.  GI:   Soft & nt; nml bowel sounds; no organomegaly or masses detected.   Musco: Warm bil, no deformities or joint swelling noted.   Neuro: alert, no focal deficits noted.    Skin: Warm, no lesions or rashes    Lab Results:  CBC  BMET   BNP  Imaging: No results found.   Assessment & Plan:   Obstructive sleep apnea Well controlled on CPAP   Plan  Patient Instructions  Wear CPAP each night for at least 4 hrs each night .  Do not drive if sleepy .  Work on healthy weight .  Change to Dream Wear full face .  Saline nasal rinses/spray and gel As needed   Albuterol inhaler As needed   Follow up with Dr. Elsworth Soho in 4-6 months and As needed        Asthma Well controlled   Plan  Patient Instructions  Wear CPAP each night for at least 4 hrs each night .  Do not drive if sleepy .  Work on healthy weight .  Change to Dream Wear full face .  Saline  nasal rinses/spray and gel As needed   Albuterol inhaler As needed   Follow up with Dr. Elsworth Soho in 4-6 months and As needed        Chronic pansinusitis Controlled on rx  Add saline nasal   Plan  Patient Instructions  Wear CPAP each night for at least 4 hrs each night .  Do not drive if sleepy .  Work on healthy weight .  Change to Dream Wear full face .  Saline nasal rinses/spray and gel As needed   Albuterol inhaler As needed   Follow up with Dr. Elsworth Soho in 4-6 months and As needed           Rexene Edison, NP 11/28/2017

## 2017-11-28 NOTE — Assessment & Plan Note (Signed)
Controlled on rx  Add saline nasal   Plan  Patient Instructions  Wear CPAP each night for at least 4 hrs each night .  Do not drive if sleepy .  Work on healthy weight .  Change to Dream Wear full face .  Saline nasal rinses/spray and gel As needed   Albuterol inhaler As needed   Follow up with Dr. Elsworth Soho in 4-6 months and As needed

## 2017-11-28 NOTE — Assessment & Plan Note (Signed)
Well controlled on CPAP   Plan  Patient Instructions  Wear CPAP each night for at least 4 hrs each night .  Do not drive if sleepy .  Work on healthy weight .  Change to Dream Wear full face .  Saline nasal rinses/spray and gel As needed   Albuterol inhaler As needed   Follow up with Dr. Elsworth Soho in 4-6 months and As needed

## 2017-11-28 NOTE — Assessment & Plan Note (Signed)
Well controlled   Plan  Patient Instructions  Wear CPAP each night for at least 4 hrs each night .  Do not drive if sleepy .  Work on healthy weight .  Change to Dream Wear full face .  Saline nasal rinses/spray and gel As needed   Albuterol inhaler As needed   Follow up with Dr. Elsworth Soho in 4-6 months and As needed

## 2017-11-28 NOTE — Patient Instructions (Signed)
Wear CPAP each night for at least 4 hrs each night .  Do not drive if sleepy .  Work on healthy weight .  Change to Dream Wear full face .  Saline nasal rinses/spray and gel As needed   Albuterol inhaler As needed   Follow up with Dr. Elsworth Soho in 4-6 months and As needed

## 2017-11-30 ENCOUNTER — Telehealth: Payer: Self-pay | Admitting: Pulmonary Disease

## 2017-11-30 NOTE — Telephone Encounter (Signed)
Left message for patient to call back  

## 2017-11-30 NOTE — Progress Notes (Signed)
Reviewed & agree with plan  

## 2017-12-03 NOTE — Telephone Encounter (Signed)
Spoke with patient. She stated that she received a new mask and hosing on 5/8. Per the patient, the mask and hose do not fit. She wanted to know if she could receive a new hose from [place]. Advised patient that I would reach out to [facility] to see if they could swap hoses for her. She verbalized understanding.   ATC [facility] but was placed on hold for over 5 minutes. Will route to myself so that I can call back later today.

## 2017-12-05 DIAGNOSIS — M5417 Radiculopathy, lumbosacral region: Secondary | ICD-10-CM | POA: Diagnosis not present

## 2017-12-05 DIAGNOSIS — M9905 Segmental and somatic dysfunction of pelvic region: Secondary | ICD-10-CM | POA: Diagnosis not present

## 2017-12-05 DIAGNOSIS — M9902 Segmental and somatic dysfunction of thoracic region: Secondary | ICD-10-CM | POA: Diagnosis not present

## 2017-12-05 DIAGNOSIS — M9903 Segmental and somatic dysfunction of lumbar region: Secondary | ICD-10-CM | POA: Diagnosis not present

## 2017-12-05 DIAGNOSIS — M5137 Other intervertebral disc degeneration, lumbosacral region: Secondary | ICD-10-CM | POA: Diagnosis not present

## 2017-12-05 DIAGNOSIS — M5415 Radiculopathy, thoracolumbar region: Secondary | ICD-10-CM | POA: Diagnosis not present

## 2017-12-05 NOTE — Telephone Encounter (Signed)
Called [facility] and spoke with Olivia Mackie, states that she will call patient to have mask changed out for a correct fitting device.  Nothing further needed.

## 2017-12-07 DIAGNOSIS — M5137 Other intervertebral disc degeneration, lumbosacral region: Secondary | ICD-10-CM | POA: Diagnosis not present

## 2017-12-07 DIAGNOSIS — M9903 Segmental and somatic dysfunction of lumbar region: Secondary | ICD-10-CM | POA: Diagnosis not present

## 2017-12-07 DIAGNOSIS — M9905 Segmental and somatic dysfunction of pelvic region: Secondary | ICD-10-CM | POA: Diagnosis not present

## 2017-12-07 DIAGNOSIS — M5415 Radiculopathy, thoracolumbar region: Secondary | ICD-10-CM | POA: Diagnosis not present

## 2017-12-07 DIAGNOSIS — M5417 Radiculopathy, lumbosacral region: Secondary | ICD-10-CM | POA: Diagnosis not present

## 2017-12-07 DIAGNOSIS — M9902 Segmental and somatic dysfunction of thoracic region: Secondary | ICD-10-CM | POA: Diagnosis not present

## 2017-12-10 DIAGNOSIS — M5415 Radiculopathy, thoracolumbar region: Secondary | ICD-10-CM | POA: Diagnosis not present

## 2017-12-10 DIAGNOSIS — M9902 Segmental and somatic dysfunction of thoracic region: Secondary | ICD-10-CM | POA: Diagnosis not present

## 2017-12-10 DIAGNOSIS — M5137 Other intervertebral disc degeneration, lumbosacral region: Secondary | ICD-10-CM | POA: Diagnosis not present

## 2017-12-10 DIAGNOSIS — M9905 Segmental and somatic dysfunction of pelvic region: Secondary | ICD-10-CM | POA: Diagnosis not present

## 2017-12-10 DIAGNOSIS — M9903 Segmental and somatic dysfunction of lumbar region: Secondary | ICD-10-CM | POA: Diagnosis not present

## 2017-12-10 DIAGNOSIS — M5417 Radiculopathy, lumbosacral region: Secondary | ICD-10-CM | POA: Diagnosis not present

## 2017-12-11 ENCOUNTER — Ambulatory Visit (INDEPENDENT_AMBULATORY_CARE_PROVIDER_SITE_OTHER): Payer: Medicare Other | Admitting: Dietician

## 2017-12-11 ENCOUNTER — Encounter: Payer: Self-pay | Admitting: Internal Medicine

## 2017-12-11 ENCOUNTER — Ambulatory Visit (INDEPENDENT_AMBULATORY_CARE_PROVIDER_SITE_OTHER): Payer: Medicare Other | Admitting: Internal Medicine

## 2017-12-11 ENCOUNTER — Encounter: Payer: Self-pay | Admitting: Dietician

## 2017-12-11 ENCOUNTER — Other Ambulatory Visit: Payer: Self-pay | Admitting: Internal Medicine

## 2017-12-11 VITALS — BP 119/74 | HR 73 | Temp 98.1°F | Wt 188.8 lb

## 2017-12-11 DIAGNOSIS — Z713 Dietary counseling and surveillance: Secondary | ICD-10-CM | POA: Diagnosis not present

## 2017-12-11 DIAGNOSIS — Z794 Long term (current) use of insulin: Secondary | ICD-10-CM

## 2017-12-11 DIAGNOSIS — E1165 Type 2 diabetes mellitus with hyperglycemia: Secondary | ICD-10-CM

## 2017-12-11 DIAGNOSIS — F4320 Adjustment disorder, unspecified: Secondary | ICD-10-CM

## 2017-12-11 DIAGNOSIS — E118 Type 2 diabetes mellitus with unspecified complications: Secondary | ICD-10-CM | POA: Diagnosis not present

## 2017-12-11 DIAGNOSIS — Z6832 Body mass index (BMI) 32.0-32.9, adult: Secondary | ICD-10-CM

## 2017-12-11 DIAGNOSIS — E119 Type 2 diabetes mellitus without complications: Secondary | ICD-10-CM

## 2017-12-11 DIAGNOSIS — F4321 Adjustment disorder with depressed mood: Secondary | ICD-10-CM

## 2017-12-11 LAB — GLUCOSE, CAPILLARY: Glucose-Capillary: 428 mg/dL — ABNORMAL HIGH (ref 65–99)

## 2017-12-11 LAB — POCT GLYCOSYLATED HEMOGLOBIN (HGB A1C): Hemoglobin A1C: 12 % — AB (ref 4.0–5.6)

## 2017-12-11 MED ORDER — INSULIN DEGLUDEC-LIRAGLUTIDE 100-3.6 UNIT-MG/ML ~~LOC~~ SOPN: 10.0000 [IU] | PEN_INJECTOR | Freq: Every day | SUBCUTANEOUS | 2 refills | Status: DC

## 2017-12-11 MED ORDER — METFORMIN HCL 1000 MG PO TABS: 1000.0000 mg | ORAL_TABLET | Freq: Two times a day (BID) | ORAL | 0 refills | Status: DC

## 2017-12-11 NOTE — Progress Notes (Signed)
Diabetes Self-Management Education  Visit Type: First/Initial  Appt. Start Time: 1615 Appt. End Time: 1700  12/11/2017  [name], identified by name and date of birth, is a 52 y.o. female with a diagnosis of Diabetes: Type 2.   ASSESSMENT  Freestyle Libre Pro CGM sensor placed and started. Patient was educated about wearing sensor, keeping food, activity and medication log and when to call office. Follow up was arranged with the patient.   Lab Results  Component Value Date   HGBA1C 12.0 (A) 12/11/2017   Current Outpatient Medications on File Prior to Visit  Medication Sig Dispense Refill  . albuterol (PROVENTIL HFA;VENTOLIN HFA) 108 (90 Base) MCG/ACT inhaler Inhale 1-2 puffs into the lungs every 6 (six) hours as needed for wheezing or shortness of breath. 1 Inhaler 0  . aspirin 81 MG chewable tablet Chew 81 mg by mouth daily.     Marland Kitchen BAYER CONTOUR TEST test strip   1  . carvedilol (COREG) 25 MG tablet Take 1 tablet (25 mg total) by mouth 2 (two) times daily with a meal. 60 tablet 11  . diclofenac sodium (VOLTAREN) 1 % GEL Apply 2 g 4 (four) times daily topically. 100 g 1  . eplerenone (INSPRA) 25 MG tablet TAKE 0.5 TABLETS (12.5 MG TOTAL) BY MOUTH DAILY. 15 tablet 2  . fluticasone (FLONASE) 50 MCG/ACT nasal spray Place 1 spray into both nostrils 2 (two) times daily. 16 g 2  . furosemide (LASIX) 40 MG tablet Take 2 tablets (80 mg total) by mouth every morning AND 1 tablet (40 mg total) every evening. 90 tablet 3  . Insulin Degludec-Liraglutide (XULTOPHY) 100-3.6 UNIT-MG/ML SOPN Inject 10 Units into the skin at bedtime. 1 pen 2  . loratadine (CLARITIN) 10 MG tablet Take 1 tablet (10 mg total) by mouth daily. (Patient taking differently: Take 10 mg by mouth daily as needed for allergies. ) 30 tablet 2  . metFORMIN (GLUCOPHAGE) 1000 MG tablet Take 1 tablet (1,000 mg total) by mouth 2 (two) times daily with a meal. 180 tablet 0  . PATADAY 0.2 % SOLN Place 1 drop into both eyes daily.   4  . PRESCRIPTION MEDICATION Inhale into the lungs at bedtime. CPAP    . ranitidine (ZANTAC) 150 MG tablet Take 1 tablet (150 mg total) by mouth 2 (two) times daily. 180 tablet 3  . sacubitril-valsartan (ENTRESTO) 97-103 MG Take 1 tablet by mouth 2 (two) times daily. 60 tablet 11  . sodium chloride (OCEAN) 0.65 % SOLN nasal spray Place 2 sprays into both nostrils daily as needed for congestion. 15 mL 1   No current facility-administered medications on file prior to visit.    Wt Readings from Last 3 Encounters:  12/11/17 188 lb 12.8 oz (85.6 kg)  11/28/17 191 lb (86.6 kg)  10/16/17 191 lb (86.6 kg)   Estimated body mass index is 32.41 kg/m as calculated from the following:   Height as of 11/28/17: 5\' 4"  (1.626 m).   Weight as of an earlier encounter on 12/11/17: 188 lb 12.8 oz (85.6 kg).   Diabetes Self-Management Education - 12/11/17 1700      Visit Information   Visit Type  First/Initial      Initial Visit   Diabetes Type  Type 2    Are you currently following a meal plan?  No    Are you taking your medications as prescribed?  No      Psychosocial Assessment   Patient Belief/Attitude about Diabetes  Motivated  to manage diabetes    Self-care barriers  Lack of material resources    Self-management support  Doctor's office;Family;CDE visits      Pre-Education Assessment   Patient understands incorporating nutritional management into lifestyle.  Needs Instruction    Patient understands using medications safely.  Needs Instruction    Patient understands monitoring blood glucose, interpreting and using results  Needs Instruction    Patient understands prevention, detection, and treatment of acute complications.  Needs Instruction      Complications   Last HgB A1C per patient/outside source  12 %    How often do you check your blood sugar?  3-4 times / week      Patient Education   Nutrition management   Carbohydrate counting    Medications  Taught/reviewed insulin injection,  site rotation, insulin storage and needle disposal.;Reviewed patients medication for diabetes, action, purpose, timing of dose and side effects.    Monitoring  Purpose and frequency of SMBG.;Other (comment) cgm explained and placed      Outcomes   Expected Outcomes  Demonstrated interest in learning. Expect positive outcomes    Future DMSE  Other (comment) 1 week    Program Status  Not Completed       Individualized Plan for Diabetes Self-Management Training:   Learning Objective:  Patient will have a greater understanding of diabetes self-management. Patient education plan is to attend individual and/or group sessions per assessed needs and concerns.   Plan:   There are no Patient Instructions on file for this visit.  Expected Outcomes:  Demonstrated interest in learning. Expect positive outcomes  Education material provided: Meal plan card  If problems or questions, patient to contact team via:  Phone  Future DSME appointment: Other (comment)(1 week)  Susan Peck, RD 12/11/2017 5:48 PM.

## 2017-12-11 NOTE — Patient Instructions (Signed)
For diabetes:  -STOP using glimeperide  -STOP using jardiance  -Start using Xultophy 10 units daily at bedtime as instructed  -Take Metformin 1000 mg twice daily with meals  -Butch Penny will set up a continuous glucose monitor for you today. Return to the clinic in 1 week.

## 2017-12-12 ENCOUNTER — Telehealth (HOSPITAL_COMMUNITY): Payer: Self-pay | Admitting: *Deleted

## 2017-12-12 DIAGNOSIS — M5417 Radiculopathy, lumbosacral region: Secondary | ICD-10-CM | POA: Diagnosis not present

## 2017-12-12 DIAGNOSIS — M5137 Other intervertebral disc degeneration, lumbosacral region: Secondary | ICD-10-CM | POA: Diagnosis not present

## 2017-12-12 DIAGNOSIS — M9902 Segmental and somatic dysfunction of thoracic region: Secondary | ICD-10-CM | POA: Diagnosis not present

## 2017-12-12 DIAGNOSIS — F4321 Adjustment disorder with depressed mood: Secondary | ICD-10-CM | POA: Insufficient documentation

## 2017-12-12 DIAGNOSIS — M9905 Segmental and somatic dysfunction of pelvic region: Secondary | ICD-10-CM | POA: Diagnosis not present

## 2017-12-12 DIAGNOSIS — M9903 Segmental and somatic dysfunction of lumbar region: Secondary | ICD-10-CM | POA: Diagnosis not present

## 2017-12-12 DIAGNOSIS — M5415 Radiculopathy, thoracolumbar region: Secondary | ICD-10-CM | POA: Diagnosis not present

## 2017-12-12 NOTE — Assessment & Plan Note (Addendum)
Lab Results  Component Value Date   HGBA1C 12.0 (A) 12/11/2017   HGBA1C 8.7 04/03/2017   HGBA1C 7.3 04/10/2016     Assessment: Diabetes control:  Poorly controlled Progress toward A1C goal:   Deteriorated Comments: Patient is requesting [facility] to manage her diabetes.  She was previously being followed by Dr. Dwyane Dee (endocrinology) and has stopped visiting his office.  States her last endocrinology appointment was in September 2018.  She has not taken any medications since then and has not been monitoring her blood glucose at home. She has not been exercising and likes drinking sweet tea.  States she was previously prescribed Jardiance but was not able to tolerate it due to nausea.  She has been able to tolerate metformin and glimepiride in the past.  She has never been on insulin.  I discussed starting patient on insulin due to her high A1c and she was agreeable.  Discussed risks associated with uncontrolled diabetes.   Plan: Medications: Discontinue glimepiride and jardiance.  Start Xutrophy 10 units daily at bedtime and Metformin 1000 mg twice daily.  Home glucose monitoring: Patient stated she already has blood glucose testing supplies available at home.  She was interested in continuous blood glucose monitoring.  I spoke to Butch Penny and patient will be set up for continuous glucose monitoring with plan to return to the clinic in 1 week for follow-up. Frequency:  2-3 times a day Timing:  Before meals Instruction/counseling given: reminded to get eye exam, reminded to bring blood glucose meter & log to each visit, reminded to bring medications to each visit, discussed foot care, discussed the need for weight loss, discussed diet and discussed sick day management  Other plan: Consider starting the patient on Invokana if she continues to have poor glycemic control during future visits.

## 2017-12-12 NOTE — Telephone Encounter (Signed)
Delene Loll PA submitted through [facility] [place], [place] # [id number].  Will wait on approval.

## 2017-12-12 NOTE — Assessment & Plan Note (Addendum)
Medication list includes Cymbalta but patient does not recall ever taking this medication.  PHQ 9 score checked at this visit 9.  Likely related to acute grief as patient reports having a recent relationship break-up (2 days ago).  Denies suicidal ideation.  States she has a good social support system -family and friends are available to talk to her.  Plan -No indication for medication at this time.  I made her aware of counseling services available at [facility].  -Reassess symptoms at future visit.

## 2017-12-12 NOTE — Progress Notes (Signed)
   CC: Patient is requesting [facility] to manage her diabetes.  HPI:  [name] is a 52 y.o. female with a past medical history of conditions listed below presenting to the clinic requesting her diabetes to be managed.  She was previously being followed by Dr. Dwyane Dee (endocrinology) but has stopped visiting his office. Please see problem based charting for the status of the patient's current and chronic medical conditions.   Past Medical History:  Diagnosis Date  . AICD (automatic cardioverter/defibrillator) present 12/13/2016  . Anxiety    no meds  . Asthma   . CHF (congestive heart failure) ([place])   . Depression    no meds  . Diabetes mellitus    recent dx 11/17/11 - started med 11/18/11 type 2  . GERD (gastroesophageal reflux disease)   . Goiter 07/27/2011   non-neoplastic goiter - fine needle aspiration - benign sees dr Dwyane Dee for  . Heart murmur    dx 2 yrs ago per pt  . Herpes genitalis in women   . Hypertension   . IBS (irritable bowel syndrome)    tx with diet per pt  . Low back pain    history  . Obesity   . Seasonal allergies   . Shortness of breath    occasional - exercise induced  . Sleep apnea    cpap broken  . Systolic heart failure    May 2011 EF 35-40%, 04/03/11 EF 25-30%, 06/27/11 EF 30-35%   Review of Systems: Pertinent positives mentioned in HPI. Remainder of all ROS negative.   Physical Exam:  Vitals:   12/11/17 1446  BP: 119/74  Pulse: 73  Temp: 98.1 F (36.7 C)  TempSrc: Oral  SpO2: 99%  Weight: 188 lb 12.8 oz (85.6 kg)   Physical Exam  Constitutional: She is oriented to person, place, and time. She appears well-developed and well-nourished. No distress.  HENT:  Head: Normocephalic and atraumatic.  Mouth/Throat: Oropharynx is clear and moist.  Cardiovascular: Normal rate, regular rhythm and intact distal pulses.  Pulmonary/Chest: Effort normal and breath sounds normal. No respiratory distress.  Abdominal: Soft. Bowel sounds are normal. She  exhibits no distension. There is no tenderness.  Musculoskeletal: She exhibits no edema.  Neurological: She is alert and oriented to person, place, and time.  Skin: Skin is warm and dry.    Assessment & Plan:   See Encounters Tab for problem based charting.  Patient discussed with Dr. Angelia Mould

## 2017-12-14 ENCOUNTER — Other Ambulatory Visit: Payer: Self-pay | Admitting: Oncology

## 2017-12-14 ENCOUNTER — Telehealth: Payer: Self-pay

## 2017-12-14 MED ORDER — ONDANSETRON 4 MG PO TBDP
4.0000 mg | ORAL_TABLET | Freq: Three times a day (TID) | ORAL | 1 refills | Status: DC | PRN
Start: 2017-12-14 — End: 2018-11-27

## 2017-12-14 NOTE — Telephone Encounter (Signed)
Pt states metformin is causing her to have diarrhea. Would like to speak with a nurse.

## 2017-12-14 NOTE — Progress Notes (Signed)
Internal Medicine Clinic Attending  Case discussed with Dr. Rathoreat the time of the visit. We reviewed the resident's history and exam and pertinent patient test results. I agree with the assessment, diagnosis, and plan of care documented in the resident's note.  

## 2017-12-14 NOTE — Telephone Encounter (Signed)
Spoke w/ pt, she will do as requested, repeated back instructions, has appt tues 5/28, thanks the doctor for his help

## 2017-12-14 NOTE — Telephone Encounter (Signed)
Pt states she started metformin wed and has diarrhea and N&V since starting, she states she cannot tolerate any food or liquid. She is able to tolerate ginger ale but food makes her nauseous then the vomiting and diarrhea start Please advise ASAP

## 2017-12-14 NOTE — Telephone Encounter (Signed)
Hi Dr. Maudie Mercury, it looks like this patient's insurance is not covering [place]. Is this medication eligible for a prior auth? I'm trying to combine long acting insulin with GLP-1 agonist to avoid multiple injections and increase compliance. Are there any other combinations available? Thanks, I really appreciate your help.

## 2017-12-14 NOTE — Telephone Encounter (Signed)
Stop metformin Stay on Xutrophy Call in Rx zofran sublingual 4 mg Q 6-8 hours prn - I will see if I can do this electronically Encourage PO fluids/gatorade once she stops vomiting Schedule acute care visit for next week Go to ED for persistent vomiting over weekend

## 2017-12-18 ENCOUNTER — Telehealth (HOSPITAL_COMMUNITY): Payer: Self-pay

## 2017-12-18 ENCOUNTER — Encounter: Payer: Self-pay | Admitting: Internal Medicine

## 2017-12-18 ENCOUNTER — Ambulatory Visit: Payer: Medicare Other | Admitting: Dietician

## 2017-12-18 ENCOUNTER — Encounter: Payer: Self-pay | Admitting: Dietician

## 2017-12-18 ENCOUNTER — Ambulatory Visit (INDEPENDENT_AMBULATORY_CARE_PROVIDER_SITE_OTHER): Payer: Medicare Other | Admitting: Internal Medicine

## 2017-12-18 DIAGNOSIS — E1165 Type 2 diabetes mellitus with hyperglycemia: Secondary | ICD-10-CM

## 2017-12-18 MED ORDER — INSULIN DEGLUDEC-LIRAGLUTIDE 100-3.6 UNIT-MG/ML ~~LOC~~ SOPN: 12.0000 [IU] | PEN_INJECTOR | Freq: Every day | SUBCUTANEOUS | 2 refills | Status: DC

## 2017-12-18 NOTE — Patient Instructions (Addendum)
FOLLOW-UP INSTRUCTIONS When: Please return in 7 days with [place] for Download #2. What to bring: Yourself   I have increased your Xultophy just slightly to 12Units daily. However,  I would also like for you to begin taking this in the morning as I believe this would best benefit you and best decrease your average glucose levels most effectively  Thank you for your visit to Nyra Jabs in clinic

## 2017-12-18 NOTE — Progress Notes (Signed)
CC: diabetes medication management   HPI:Susan Peck is a 52 y.o. female who presents today for evaluation and treatment modifications of her diabetes mellitus.  Diabetes mellitus: The patient stated compliance with her medication. She is agreeable to injections but does not prefer them. She denied confusion, diaphoresis, weakness, palpitations, nausea, vomiting, diarrhea, chest pain, headaches, visual changes, abdominal pain or muscle aches.  We had a prolonged discussion involving the benefits of dietary modifications, weight loss and exercise on treating her diabetes. I advised keeping a simple meal plan (three component plan) such as consuming a large serving of vegetables, a starch and a protein with lunch and dinner. I explained briefly what each of these groups were represented by and how she could use this simple strategy to modify her meals and simplify the process to better encourage compliance and longevity of her lifestyle modifications.  In addition we addressed the need to exercise and to continue to maintain medication dosing.  Our pharmacy team gratefully assisted today with medication affordability issues and needle samples. In addition, they were able to discuss with the patient the potential options if a medication change was indicated as her current treatment is ~45$ per pen and is unaffordable. She may need to make further ajournements or alter treatment based on affordability and her final CGM results.    Finleigh B Leija wore the CGM for 7 days. The average reading was 252, % time in target was 11, % time below target was 0, and % time above target was. 89. Intervention will be to increase Xultophy to 12U daily from 10U QHS and initiate morning dosing. The patient will be scheduled to see [facility]/[name] for a final appointment in one week.   Past Medical History:  Diagnosis Date  . AICD (automatic cardioverter/defibrillator) present 12/13/2016  . Anxiety    no meds  .  Asthma   . CHF (congestive heart failure) ([place])   . Depression    no meds  . Diabetes mellitus    recent dx 11/17/11 - started med 11/18/11 type 2  . GERD (gastroesophageal reflux disease)   . Goiter 07/27/2011   non-neoplastic goiter - fine needle aspiration - benign sees dr Dwyane Dee for  . Heart murmur    dx 2 yrs ago per pt  . Herpes genitalis in women   . Hypertension   . IBS (irritable bowel syndrome)    tx with diet per pt  . Low back pain    history  . Obesity   . Seasonal allergies   . Shortness of breath    occasional - exercise induced  . Sleep apnea    cpap broken  . Systolic heart failure    May 2011 EF 35-40%, 04/03/11 EF 25-30%, 06/27/11 EF 30-35%   Review of Systems:  ROS negative except as per HPI.  Physical Exam:  Vitals:   12/18/17 0950  BP: (!) 155/101  Pulse: 72  Temp: 97.7 F (36.5 C)  TempSrc: Oral  SpO2: 99%  Weight: 192 lb 1.6 oz (87.1 kg)  Height: 5\' 4"  (1.626 m)   Physical Exam  Constitutional: She is oriented to person, place, and time. She appears well-developed and well-nourished. No distress.  Cardiovascular: Normal rate and regular rhythm.  No murmur heard. Pulmonary/Chest: Effort normal and breath sounds normal. No stridor. No respiratory distress.  Musculoskeletal: She exhibits no edema.  Neurological: She is alert and oriented to person, place, and time.  Skin: She is not diaphoretic.  Psychiatric: She  has a normal mood and affect.  Vitals reviewed.  Assessment & Plan:   See Encounters Tab for problem based charting.  Patient discussed with Dr. Lynnae January

## 2017-12-18 NOTE — Progress Notes (Signed)
Diabetes Self-Management Education  Visit Type:  Follow-up  Appt. Start Time: 906 Appt. End Time: 930  12/18/2017  [name], identified by name and date of birth, is a 51 y.o. female with a diagnosis of Diabetes:  Marland Kitchen Type 2    ASSESSMENT  Wt Readings from Last 3 Encounters:  12/18/17 192 lb 1.6 oz (87.1 kg)  12/11/17 188 lb 12.8 oz (85.6 kg)  11/28/17 191 lb (86.6 kg)   Stopped metformin- last day of taking it was Friday. A bit nauseated yesterday Could not get [place]aea medicine prescribed. Blood sugars are getting better- yesterday was 144. This am 191 No entresto for 4 days- insurance problem Says her food/activity records are at home.  Assisted patient with reviewing her CGM download and assessing the last 24-48 hours or so of food intake to observe the effects her food choices had on her blood glucose   CGM download showed- average for 7 days 252, 89% above 180 mg/dl, 11% between 70-180 and 0 less than 70mg /dl.   Her glucose pattern showes gradually decreasing blood sugar overnight from ~250 to ~ 200 mg/dl by  6 am, then begins to rise quickly to back ~ 250 or more between 12 noon and 3 PM, then another rise to a peak about 6 PM then very gradual decline back to ~ 250 at midnight.   Suggests increased need for basal and prandial coverage.  Diabetes Self-Management Education - 12/18/17 0900      Health Coping   How would you rate your overall health?  Good      Complications   Fasting Blood glucose range (mg/dL)  180-200      Dietary Intake   Dinner  baked chicken, fried chicjken, dinner roll, green beans, cherries, mashed pot. lemonade    Beverage(s)  water, lemonade      Patient Education   Nutrition management   Reviewed blood glucose goals for pre and post meals and how to evaluate the patients' food intake on their blood glucose level.;Other (comment) discussed role of food choices on CGM download    Medications  Reviewed patients medication for diabetes,  action, purpose, timing of dose and side effects. disc. options for imrpoving blood sugar control after meals      Outcomes   Program Status  Not Completed      Subsequent Visit   Since your last visit have you continued or begun to take your medications as prescribed?  No    Since your last visit have you had your blood pressure checked?  No    Since your last visit have you experienced any weight changes?  No change    Since your last visit, are you checking your blood glucose at least once a day?  Yes       Learning Objective:  Patient will have a greater understanding of diabetes self-management. Patient education plan is to attend individual and/or group sessions per assessed needs and concerns.   Plan:   Patient Instructions  Good job wearing the CGM-\ try having smaller amounts of fruits/carbs fried foods.   Keep writing on your records and bring it with you next week.  Follow up next week 30-45 minutes slot.   Susan Peck [phone]   Expected Outcomes:  Demonstrated interest in learning. Expect positive outcomes  Education material provided: Carbohydrate counting sheet  If problems or questions, patient to contact team via:  Phone  Future DSME appointment: - Other (comment)(1 week)  .Marland KitchenMarland KitchenDebera Lat, RD 12/18/2017  10:44 AM.

## 2017-12-18 NOTE — Telephone Encounter (Signed)
Patient calling for entresto samples until PA approved. 1 month supply left at front desk of CHF clinic, patient aware.  [name], RN

## 2017-12-18 NOTE — Patient Instructions (Signed)
Good job wearing the CGM-\ try having smaller amounts of fruits/carbs fried foods.   Keep writing on your records and bring it with you next week.  Follow up next week 30-45 minutes slot.   Butch Penny [phone]

## 2017-12-18 NOTE — Assessment & Plan Note (Signed)
Diabetes mellitus: The patient stated compliance with her medication. She is agreeable to injections but does not prefer them. She denied confusion, diaphoresis, weakness, palpitations, nausea, vomiting, diarrhea, chest pain, headaches, visual changes, abdominal pain or muscle aches.  We had a prolonged discussion involving the benefits of dietary modifications, weight loss and exercise on treating her diabetes. I advised keeping a simple meal plan (three component plan) such as consuming a large serving of vegetables, a starch and a protein with lunch and dinner. I explained briefly what each of these groups were represented by and how she could use this simple strategy to modify her meals and simplify the process to better encourage compliance and longevity of her lifestyle modifications.  In addition we addressed the need to exercise and to continue to maintain medication dosing.  Our pharmacy team gratefully assisted today with medication affordability issues and needle samples. In addition, they were able to discuss with the patient the potential options if a medication change was indicated as her current treatment is ~45$ per pen and is unaffordable. She may need to make further ajournements or alter treatment based on affordability and her final CGM results.    Susan Peck wore the CGM for 7 days. The average reading was 252, % time in target was 11, % time below target was 0, and % time above target was. 89. Intervention will be to increase Xultophy to 12U daily from 10U QHS and initiate morning dosing. The patient will be scheduled to see ACC/[name] for a final appointment in one week.

## 2017-12-19 ENCOUNTER — Telehealth: Payer: Self-pay | Admitting: *Deleted

## 2017-12-19 ENCOUNTER — Telehealth (HOSPITAL_COMMUNITY): Payer: Self-pay | Admitting: *Deleted

## 2017-12-19 NOTE — Telephone Encounter (Signed)
PA requested submitted online via Cover My Meds-Request sent for review.Regenia Skeeter, Jeremy Mclamb Cassady5/29/20199:26 AM   Request [phone] Form: [employer] Medicare 4-Part NCPDP Electronic PA Form Drug: Ondansetron 4MG  dispersible tablets Prescriber Instructions Patient Information Name - First: Susan Name - Last: Peck: [place]: [place]: [place] Address - Zip: [place] Date of Birth: 10-14-1965 Gender: f Patient ID: [id number] Phone: [phone] Medication Name: Ondansetron 4MG  dispersible tablets Quantity - Quantity: 20 Quantity - Dosage unit (e.g., tablets, mL): Tablet Days Supply - (up to 3 digits): 6 Type of Review Are you requesting an expedited review? - By requesting an expedited review [24 hours], you are certifying that applying the 72 hour standard review time frame may seriously jeopardize the life or health of the patient or the patient's ability to regain maximum function.: No Do Not Send To Plan For Records Only Prescriber Next Steps Ondansetron1 Medicare PDP Clinical Standard Is the medication being used as part of an anti-cancer chemotherapeutic regimen?: No Is the medication being used as full therapeutic replacement for an IV anti-nausea drug within 48 hours of chemotherapy?: No

## 2017-12-19 NOTE — Telephone Encounter (Signed)
The prior auth can go through---the patient failed Victoza in the past. She has also failed Jardiance, glipizide and glimepiride.  Thank you

## 2017-12-19 NOTE — Telephone Encounter (Signed)
Patient's Entresto 97-103 mg PA has been approved through [employer] from 12/18/17 through 12/18/2018.

## 2017-12-21 ENCOUNTER — Telehealth: Payer: Self-pay | Admitting: *Deleted

## 2017-12-21 DIAGNOSIS — H1045 Other chronic allergic conjunctivitis: Secondary | ICD-10-CM | POA: Diagnosis not present

## 2017-12-21 NOTE — Telephone Encounter (Signed)
Thanks Dr. Kim!

## 2017-12-21 NOTE — Telephone Encounter (Addendum)
Contacted pharmacy- PA still needed on pt's xultophy rx.  Contacted pt's insurance at [phone] to initiate  PA.  Patient has failed victoza, jariance, glipizide, glimepiride.  Request was sent for review.Despina Hidden Cassady5/31/201911:36 AM

## 2017-12-21 NOTE — Progress Notes (Signed)
Internal Medicine Clinic Attending  Case discussed with Dr. Harbrecht at the time of the visit.  We reviewed the resident's history and exam and pertinent patient test results.  I agree with the assessment, diagnosis, and plan of care documented in the resident's note.   

## 2017-12-25 ENCOUNTER — Ambulatory Visit (INDEPENDENT_AMBULATORY_CARE_PROVIDER_SITE_OTHER): Payer: Medicare Other | Admitting: Dietician

## 2017-12-25 ENCOUNTER — Ambulatory Visit (INDEPENDENT_AMBULATORY_CARE_PROVIDER_SITE_OTHER): Payer: Medicare Other | Admitting: Internal Medicine

## 2017-12-25 ENCOUNTER — Encounter: Payer: Self-pay | Admitting: Dietician

## 2017-12-25 ENCOUNTER — Encounter: Payer: Self-pay | Admitting: Internal Medicine

## 2017-12-25 ENCOUNTER — Other Ambulatory Visit: Payer: Self-pay

## 2017-12-25 ENCOUNTER — Ambulatory Visit: Payer: Medicare Other | Admitting: Pharmacist

## 2017-12-25 ENCOUNTER — Telehealth: Payer: Self-pay | Admitting: Dietician

## 2017-12-25 DIAGNOSIS — E118 Type 2 diabetes mellitus with unspecified complications: Secondary | ICD-10-CM

## 2017-12-25 DIAGNOSIS — Z794 Long term (current) use of insulin: Secondary | ICD-10-CM

## 2017-12-25 DIAGNOSIS — Z713 Dietary counseling and surveillance: Secondary | ICD-10-CM | POA: Diagnosis not present

## 2017-12-25 DIAGNOSIS — E1165 Type 2 diabetes mellitus with hyperglycemia: Secondary | ICD-10-CM

## 2017-12-25 DIAGNOSIS — E119 Type 2 diabetes mellitus without complications: Secondary | ICD-10-CM

## 2017-12-25 DIAGNOSIS — Z8679 Personal history of other diseases of the circulatory system: Secondary | ICD-10-CM | POA: Diagnosis not present

## 2017-12-25 MED ORDER — INSULIN PEN NEEDLE 32G X 4 MM MISC: 5 refills | Status: DC

## 2017-12-25 NOTE — Telephone Encounter (Signed)
Calls saying she needs pen needles

## 2017-12-25 NOTE — Telephone Encounter (Signed)
Script sent 5/28, pt presents this day and states PA went through

## 2017-12-25 NOTE — Telephone Encounter (Signed)
Per pt this day PA for xultophy has been approved

## 2017-12-25 NOTE — Patient Instructions (Addendum)
Good job being able to identify foods that increase blood sugar and use this as a tool to lower your blood sugar!  Try a smaller amount of cereal or add some protein or nuts for fullness.   Please make a follow up in 2-3 weeks.   Butch Penny [phone]

## 2017-12-25 NOTE — Progress Notes (Signed)
Diabetes Self-Management Education  Visit Type:  Follow-up  Appt. Start Time: 928 Appt. End Time: 958  12/25/2017  [name], identified by name and date of birth, is a 52 y.o. female with a diagnosis of Diabetes: Type 2. Type 2    ASSESSMENT  Wt Readings from Last 3 Encounters:  12/25/17 187 lb 9.6 oz (85.1 kg)  12/18/17 192 lb 1.6 oz (87.1 kg)  12/11/17 188 lb 12.8 oz (85.6 kg)   Brought food activity records. Walked once. Was abel to identify foods that increase blood sugar, but was reading only sugar on the food labels.  We reviewed what she can do do lower her blood sugar when she knows she has eaten too much carb.    CGM download with food record reveiwd with Dr. Charlynn Grimes and showed- average for 14 days 207 (decreased from 252 last week), 207 mg/dL Time In Range:  Above 180 mg/dL56%  (above 250 mg/dL: 21%)  In Target Range- 70-180 mg/dL- 44%  Below 70 mg/dL 0%  (below 54 mg/dL: 0%) Coefficient of Variation (CV) 33.8% (19-25) Standard Deviation (SD) mg/dL 70   Her glucose pattern this week shows good flat basal with only a few spikes after meals which she was abel to predict base don what she had eaten.   Suggests adequate basal and mostly adequate prandial coverage.  Diabetes Self-Management Education - 12/25/17 1000      Initial Visit   What type of meal plan do you follow?  carb controlled      Health Coping   How would you rate your overall health?  Good      Complications   Fasting Blood glucose range (mg/dL)  70-129    Postprandial Blood glucose range (mg/dL)  130-179;180-200      Patient Education   Nutrition management   Food label reading, portion sizes and measuring food.;Carbohydrate counting    Monitoring  Other (comment) cgm download reveiwed    Acute complications  Discussed and identified patients' treatment of hyperglycemia.      Patient Self-Evaluation of Goals - Patient rates self as meeting previously set goals (% of time)   Nutrition   >75%      Outcomes   Program Status  Not Completed      Subsequent Visit   Since your last visit have you continued or begun to take your medications as prescribed?  Yes    Since your last visit have you had your blood pressure checked?  No    Since your last visit have you experienced any weight changes?  Loss    Weight Loss (lbs)  2    Since your last visit, are you checking your blood glucose at least once a day?  Yes       Learning Objective:  Patient will have a greater understanding of diabetes self-management. Patient education plan is to attend individual and/or group sessions per assessed needs and concerns.   Plan:   Patient Instructions  Good job being able to identify foods that increase blood sugar and use this as a tool to lower your blood sugar!  Try a smaller amount of cereal or add some protein or nuts for fullness.   Please make a follow up in 2-3 weeks.   Butch Penny [phone]   Expected Outcomes:  Demonstrated interest in learning. Expect positive outcomes  Education material provided: Carbohydrate counting sheet  If problems or questions, patient to contact team via:  Phone  Future DSME appointment: -  4-6 wks  .Marland KitchenMarland KitchenDebera Lat, RD 12/25/2017 10:48 AM.

## 2017-12-25 NOTE — Patient Instructions (Signed)
Ms. Quarry,  I am glad you are doing so well can see the impact of these high carbohydrate meals like pizza or noodles have on your blood sugar.  Please continue to work on your diet and now that the meter is coming off continue to check your blood sugars regularly throughout the day.  We will keep the dosing of your Xultophy the same.  These follow-up with your regular doctor in 3 months for follow-up.

## 2017-12-26 ENCOUNTER — Ambulatory Visit (INDEPENDENT_AMBULATORY_CARE_PROVIDER_SITE_OTHER): Payer: Medicare Other | Admitting: *Deleted

## 2017-12-26 DIAGNOSIS — I428 Other cardiomyopathies: Secondary | ICD-10-CM | POA: Diagnosis not present

## 2017-12-26 NOTE — Progress Notes (Signed)
Internal Medicine Clinic Attending  Case discussed with Dr. Boswell  at the time of the visit.  We reviewed the resident's history and exam and pertinent patient test results.  I agree with the assessment, diagnosis, and plan of care documented in the resident's note.  Alexander N Raines, MD   

## 2017-12-26 NOTE — Progress Notes (Signed)
   CC: CGM DM management  HPI:  Susan Peck is a 52 y.o. female with a past medical history listed below here today for follow up of her DM.  For details of today's visit and the status of her chronic medical issues please refer to the assessment and plan.   Past Medical History:  Diagnosis Date  . AICD (automatic cardioverter/defibrillator) present 12/13/2016  . Anxiety    no meds  . Asthma   . CHF (congestive heart failure) ([place])   . Depression    no meds  . Diabetes mellitus    recent dx 11/17/11 - started med 11/18/11 type 2  . GERD (gastroesophageal reflux disease)   . Goiter 07/27/2011   non-neoplastic goiter - fine needle aspiration - benign sees dr Dwyane Dee for  . Heart murmur    dx 2 yrs ago per pt  . Herpes genitalis in women   . Hypertension   . IBS (irritable bowel syndrome)    tx with diet per pt  . Low back pain    history  . Obesity   . Seasonal allergies   . Shortness of breath    occasional - exercise induced  . Sleep apnea    cpap broken  . Systolic heart failure    May 2011 EF 35-40%, 04/03/11 EF 25-30%, 06/27/11 EF 30-35%   Review of Systems:  No chest pain or shortness of breath  Physical Exam:  Vitals:   12/25/17 1002  BP: 110/62  Pulse: 69  Temp: 98 F (36.7 C)  TempSrc: Oral  SpO2: 98%  Weight: 187 lb 9.6 oz (85.1 kg)  Height: 5\' 4"  (1.626 m)   GENERAL- alert, co-operative, appears as stated age, not in any distress. HEENT- Atraumatic, normocephalic CARDIAC- RRR, no murmurs, rubs or gallops. RESP- clear to auscultation bilaterally, no wheezes or crackles. SKIN- Warm, dry, No rash or lesion.  Assessment & Plan:   See Encounters Tab for problem based charting.  Patient discussed with Dr. Rebeca Alert

## 2017-12-26 NOTE — Progress Notes (Signed)
Remote ICD transmission.   

## 2017-12-26 NOTE — Assessment & Plan Note (Signed)
Lab Results  Component Value Date   HGBA1C 12.0 (A) 12/11/2017   HGBA1C 8.7 04/03/2017   HGBA1C 7.3 04/10/2016    Ms. Schneller is currently doing well with her injections of Xultophi 15 units every morning.  Reports that she is able to obtain the medication for $4 now and is very appreciate of the help she has gotten.  She reports she is been following her sugars closely and has gained insight into what she needs to do to better control her blood sugars.  She has noted that several times when she is eating noodles, pasta, high carbohydrate meals with things such as biscuits or potato wedges that her blood sugars have spiked.  For the past several days she has cut these things out of her diet and her blood sugars have been much better controlled.  Akyia B Cottingham wore the CGM for 15 days. The average reading was 207, % time in target was 44, % time below target was 0, and % time above target was 56.  His numbers are all improved from her previous reading on 12/18/2017.  She is shown that with proper dietary control that she can control her blood sugars as her average blood sugars for the past 3 days since initiating a lot of her dietary changes have been 164, 149, 129.  Intervention will be to continue her current dosing and encouraging continued dietary changes as well as exercise. The patient will have her CGM removed today with her final visit with Butch Penny.  While the patient follow-up with her regular PCP and 3 months for reassessment.  Would consider additional non-insulin therapies if needed at that time such as SGLT2 inhibitors that are cardioprotective even her history of heart disease.

## 2017-12-27 ENCOUNTER — Encounter: Payer: Self-pay | Admitting: Cardiology

## 2018-01-01 ENCOUNTER — Encounter: Payer: Self-pay | Admitting: Internal Medicine

## 2018-01-01 ENCOUNTER — Ambulatory Visit (INDEPENDENT_AMBULATORY_CARE_PROVIDER_SITE_OTHER): Payer: Medicare Other | Admitting: Internal Medicine

## 2018-01-01 VITALS — BP 104/74 | HR 75 | Ht 64.0 in | Wt 189.0 lb

## 2018-01-01 DIAGNOSIS — I447 Left bundle-branch block, unspecified: Secondary | ICD-10-CM | POA: Diagnosis not present

## 2018-01-01 DIAGNOSIS — I428 Other cardiomyopathies: Secondary | ICD-10-CM | POA: Diagnosis not present

## 2018-01-01 DIAGNOSIS — Z95810 Presence of automatic (implantable) cardiac defibrillator: Secondary | ICD-10-CM

## 2018-01-01 DIAGNOSIS — I5022 Chronic systolic (congestive) heart failure: Secondary | ICD-10-CM | POA: Diagnosis not present

## 2018-01-01 LAB — CUP PACEART INCLINIC DEVICE CHECK
Battery Remaining Longevity: 91 mo
Brady Statistic AP VP Percent: 3.41 %
Brady Statistic AP VS Percent: 0.01 %
Brady Statistic AS VP Percent: 96.55 %
Brady Statistic AS VS Percent: 0.04 %
Brady Statistic RA Percent Paced: 3.42 %
Brady Statistic RV Percent Paced: 99.92 %
Date Time Interrogation Session: 20190611173149
HIGH POWER IMPEDANCE MEASURED VALUE: 70 Ohm
Implantable Lead Implant Date: 20180524
Implantable Lead Location: 753860
Lead Channel Impedance Value: 161.5 Ohm
Lead Channel Impedance Value: 170.472
Lead Channel Impedance Value: 178.5 Ohm
Lead Channel Impedance Value: 178.5 Ohm
Lead Channel Impedance Value: 189.525
Lead Channel Impedance Value: 361 Ohm
Lead Channel Impedance Value: 418 Ohm
Lead Channel Impedance Value: 513 Ohm
Lead Channel Impedance Value: 532 Ohm
Lead Channel Impedance Value: 570 Ohm
Lead Channel Impedance Value: 627 Ohm
Lead Channel Impedance Value: 627 Ohm
Lead Channel Pacing Threshold Amplitude: 0.5 V
Lead Channel Pacing Threshold Amplitude: 0.75 V
Lead Channel Pacing Threshold Pulse Width: 0.4 ms
Lead Channel Sensing Intrinsic Amplitude: 2.875 mV
Lead Channel Setting Pacing Amplitude: 1.75 V
Lead Channel Setting Pacing Pulse Width: 0.4 ms
Lead Channel Setting Pacing Pulse Width: 0.4 ms
Lead Channel Setting Sensing Sensitivity: 0.3 mV
MDC IDC LEAD IMPLANT DT: 20180524
MDC IDC LEAD IMPLANT DT: 20180524
MDC IDC LEAD LOCATION: 753858
MDC IDC LEAD LOCATION: 753859
MDC IDC MSMT BATTERY VOLTAGE: 2.99 V
MDC IDC MSMT LEADCHNL LV IMPEDANCE VALUE: 323 Ohm
MDC IDC MSMT LEADCHNL LV IMPEDANCE VALUE: 323 Ohm
MDC IDC MSMT LEADCHNL LV IMPEDANCE VALUE: 399 Ohm
MDC IDC MSMT LEADCHNL LV IMPEDANCE VALUE: 608 Ohm
MDC IDC MSMT LEADCHNL LV IMPEDANCE VALUE: 665 Ohm
MDC IDC MSMT LEADCHNL LV PACING THRESHOLD PULSEWIDTH: 0.4 ms
MDC IDC MSMT LEADCHNL RA IMPEDANCE VALUE: 399 Ohm
MDC IDC MSMT LEADCHNL RA PACING THRESHOLD AMPLITUDE: 0.75 V
MDC IDC MSMT LEADCHNL RV PACING THRESHOLD PULSEWIDTH: 0.4 ms
MDC IDC MSMT LEADCHNL RV SENSING INTR AMPL: 22.5 mV
MDC IDC PG IMPLANT DT: 20180524
MDC IDC SET LEADCHNL LV PACING AMPLITUDE: 1.25 V
MDC IDC SET LEADCHNL RV PACING AMPLITUDE: 2.5 V

## 2018-01-01 NOTE — Progress Notes (Signed)
Patient Care Team: Shela Leff, MD as PCP - General (Internal Medicine)   HPI  Susan Peck is a 52 y.o. female  Seen in followup for ICD implanted 5/18 for primary prevention for NICM   DATE TEST EF   9/12 Echo  25-30%   2/17 Cath 35 % Normal CA  2/17 Echo 30-35 %   3/18 Echo  20-25%   3/19 Echo  45-50%    Less sob no edema  seh has vague chest pains, sharp and fleeting       Date Cr K Mg  8/18  0.57 4.3   3/19 1.07 4.2      Past Medical History:  Diagnosis Date  . AICD (automatic cardioverter/defibrillator) present 12/13/2016  . Anxiety    no meds  . Asthma   . CHF (congestive heart failure) ([place])   . Depression    no meds  . Diabetes mellitus    recent dx 11/17/11 - started med 11/18/11 type 2  . GERD (gastroesophageal reflux disease)   . Goiter 07/27/2011   non-neoplastic goiter - fine needle aspiration - benign sees dr Dwyane Dee for  . Heart murmur    dx 2 yrs ago per pt  . Herpes genitalis in women   . Hypertension   . IBS (irritable bowel syndrome)    tx with diet per pt  . Low back pain    history  . Obesity   . Seasonal allergies   . Shortness of breath    occasional - exercise induced  . Sleep apnea    cpap broken  . Systolic heart failure    May 2011 EF 35-40%, 04/03/11 EF 25-30%, 06/27/11 EF 30-35%    Past Surgical History:  Procedure Laterality Date  . ABDOMINAL HYSTERECTOMY    . BIV ICD INSERTION CRT-D N/A 12/13/2016   Procedure: BiV ICD Insertion CRT-D;  Surgeon: Deboraha Sprang, MD;  Location: [facility];  Service: Cardiovascular;  Laterality: N/A;  . cardiac catherization  2004   [place], [place] - Dr Lynnell Jude  . CARDIAC CATHETERIZATION    . CARDIAC CATHETERIZATION N/A 09/15/2015   Procedure: Right/Left Heart Cath and Coronary Angiography;  Surgeon: Jolaine Artist, MD;  Location: [facility];  Service: Cardiovascular;  Laterality: N/A;  . COLONOSCOPY WITH PROPOFOL N/A 03/26/2015   Procedure: COLONOSCOPY  WITH PROPOFOL;  Surgeon: Carol Ada, MD;  Location: [facility];  Service: Endoscopy;  Laterality: N/A;  . colonscopy    . CYSTOSCOPY  11/23/2011   Procedure: CYSTOSCOPY;  Surgeon: Jolayne Haines, MD;  Location: [facility];  Service: Gynecology;  Laterality: N/A;  . DIAGNOSTIC LAPAROSCOPY     of pelvis  . DILATION AND CURETTAGE OF UTERUS  12/2006,  10/2005   hysteroscopy surgery x 2  . INSERTION OF ICD  12/13/2016   BIV  . LAPAROSCOPIC ASSISTED VAGINAL HYSTERECTOMY  11/23/2011   Procedure: LAPAROSCOPIC ASSISTED VAGINAL HYSTERECTOMY;  Surgeon: Jolayne Haines, MD;  Location: [facility];  Service: Gynecology;  Laterality: N/A;  . NOVASURE ABLATION     10/2005  . SALPINGOOPHORECTOMY  11/23/2011   Procedure: SALPINGO OOPHERECTOMY;  Surgeon: Jolayne Haines, MD;  Location: [facility];  Service: Gynecology;  Laterality: Bilateral;  . SVD     x 3  . TOOTH EXTRACTION N/A 04/05/2017   Procedure: DENTAL EXTRACTIONS OF TEETH FIVE, SEVEN, SIXTEEN, SEVENTEEN;  Surgeon: Diona Browner, DDS;  Location: [facility];  Service: Oral Surgery;  Laterality: N/A;  .  TUBAL LIGATION    . WISDOM TOOTH EXTRACTION      Current Outpatient Medications  Medication Sig Dispense Refill  . albuterol (PROVENTIL HFA;VENTOLIN HFA) 108 (90 Base) MCG/ACT inhaler Inhale 1-2 puffs into the lungs every 6 (six) hours as needed for wheezing or shortness of breath. 1 Inhaler 0  . aspirin 81 MG chewable tablet Chew 81 mg by mouth daily.     Marland Kitchen BAYER CONTOUR TEST test strip   1  . carvedilol (COREG) 25 MG tablet Take 1 tablet (25 mg total) by mouth 2 (two) times daily with a meal. 60 tablet 11  . diclofenac sodium (VOLTAREN) 1 % GEL Apply 2 g 4 (four) times daily topically. 100 g 1  . eplerenone (INSPRA) 25 MG tablet TAKE 0.5 TABLETS (12.5 MG TOTAL) BY MOUTH DAILY. 15 tablet 2  . fluticasone (FLONASE) 50 MCG/ACT nasal spray Place 1 spray into both nostrils 2 (two) times daily. 16 g 2  . furosemide (LASIX) 40 MG tablet Take 2 tablets (80 mg total) by mouth  every morning AND 1 tablet (40 mg total) every evening. 90 tablet 3  . Insulin Degludec-Liraglutide (XULTOPHY) 100-3.6 UNIT-MG/ML SOPN Inject 12 Units into the skin at bedtime. 1 pen 2  . Insulin Pen Needle 32G X 4 MM MISC Use to inject xultophy daily 100 each 5  . ondansetron (ZOFRAN ODT) 4 MG disintegrating tablet Take 1 tablet (4 mg total) by mouth every 8 (eight) hours as needed for nausea or vomiting. Place under tongue 20 tablet 1  . PATADAY 0.2 % SOLN Place 1 drop into both eyes daily.  4  . PRESCRIPTION MEDICATION Inhale into the lungs at bedtime. CPAP    . sacubitril-valsartan (ENTRESTO) 97-103 MG Take 1 tablet by mouth 2 (two) times daily. 60 tablet 11  . sodium chloride (OCEAN) 0.65 % SOLN nasal spray Place 2 sprays into both nostrils daily as needed for congestion. 15 mL 1  . loratadine (CLARITIN) 10 MG tablet Take 1 tablet (10 mg total) by mouth daily. (Patient taking differently: Take 10 mg by mouth daily as needed for allergies. ) 30 tablet 2  . ranitidine (ZANTAC) 150 MG tablet Take 1 tablet (150 mg total) by mouth 2 (two) times daily. 180 tablet 3   No current facility-administered medications for this visit.     Allergies  Allergen Reactions  . Shrimp [Shellfish Allergy] Anaphylaxis      Review of Systems negative except from HPI and PMH  Physical Exam BP 104/74   Pulse 75   Ht 5\' 4"  (1.626 m)   Wt 189 lb (85.7 kg)   SpO2 95%   BMI 32.44 kg/m  Well developed and nourished in no acute distress HENT normal Neck supple  Device pocket well healed; without hematoma or erythema.  There is no tethering  Clear Regular rate and rhythm, no murmurs or gallops Abd-soft with active BS No Clubbing cyanosis edema Skin-warm and dry A & Oriented  Grossly normal sensory and motor function  ECG sinus rhythm with P synchronous pacing upright QRS V1 negative QRS in lead I  Assessment and  Plan  NICM  CHF chronic systolic  CRT- D Med[employer]onic  The patient's device was  interrogated.  The information was reviewed. No changes were made in the programming.     Euvolemic continue current meds        Current medicines are reviewed at length with the patient today .  The patient does not  have concerns regarding  medicines.

## 2018-01-01 NOTE — Patient Instructions (Signed)
Medication Instructions:  Your physician recommends that you continue on your current medications as directed. Please refer to the Current Medication list given to you today.  Labwork: None ordered.  Testing/Procedures: None ordered.  Follow-Up: Your physician wants you to follow-up in: One Year with Dr Caryl Comes. You will receive a reminder letter in the mail two months in advance. If you don't receive a letter, please call our office to schedule the follow-up appointment.  Remote monitoring is used to monitor your Pacemaker of ICD from home. This monitoring reduces the number of office visits required to check your device to one time per year. It allows [place] to keep an eye on the functioning of your device to ensure it is working properly. You are scheduled for a device check from home on 04/02/2018. You may send your transmission at any time that day. If you have a wireless device, the transmission will be sent automatically. After your physician reviews your transmission, you will receive a postcard with your next transmission date.   Any Other Special Instructions Will Be Listed Below (If Applicable).     If you need a refill on your cardiac medications before your next appointment, please call your pharmacy.

## 2018-01-07 ENCOUNTER — Other Ambulatory Visit (HOSPITAL_COMMUNITY): Payer: Self-pay | Admitting: Cardiology

## 2018-01-10 ENCOUNTER — Telehealth: Payer: Self-pay | Admitting: *Deleted

## 2018-01-10 MED ORDER — ATORVASTATIN CALCIUM 40 MG PO TABS: 40.0000 mg | ORAL_TABLET | Freq: Every day | ORAL | 3 refills | Status: DC

## 2018-01-10 NOTE — Telephone Encounter (Signed)
Patient is being followed by cardiology. Per office note from 10/16/2017, she should be on a statin. Lipitor 40 mg daily prescription has been sent to her pharmacy. Thanks.

## 2018-01-10 NOTE — Telephone Encounter (Signed)
Received fax from [facility] stating, "We spoke to your patient about diabetes care and noticed your patient has not filled a statin in the last 180 days. Your patient would like us to reach out on their behalf to determine if it is appropriate to start a statin therapy. Please send a new prescription for statin therapy if it is appropriate." L. Amro Winebarger, RN, BSN    

## 2018-01-14 ENCOUNTER — Ambulatory Visit: Payer: Medicare Other | Admitting: Dietician

## 2018-01-16 ENCOUNTER — Encounter: Payer: Self-pay | Admitting: *Deleted

## 2018-01-18 ENCOUNTER — Ambulatory Visit (INDEPENDENT_AMBULATORY_CARE_PROVIDER_SITE_OTHER): Payer: Medicare Other | Admitting: Dietician

## 2018-01-18 DIAGNOSIS — E118 Type 2 diabetes mellitus with unspecified complications: Secondary | ICD-10-CM | POA: Diagnosis not present

## 2018-01-18 DIAGNOSIS — Z713 Dietary counseling and surveillance: Secondary | ICD-10-CM

## 2018-01-18 DIAGNOSIS — E1165 Type 2 diabetes mellitus with hyperglycemia: Secondary | ICD-10-CM

## 2018-01-18 NOTE — Patient Instructions (Addendum)
Your blood sugars are great! Keep up the great work.  Your goal for the next 4 weeks is to work on walking.  Record your laps or minutes that you walk in the book.   Your Exercise Prescription:   RX:  At least 150 minutes a week of moderate intensity exercise. 30 minutes for 5 days Or 50 minutes for 3 days- every other day 45 minutes 3 and half days Walking, bicycling, stationary bicycling, dancing.  (moderate intensity means to get  a little out of breath)  Do resistance exercise at least 2 times a week.  This can be yoga poses or strength training where you lift your own weight (think leg lifts or toe raises)  or light weights like cans of beans with your arms.   Flexibility and balance exercise- safe stretching and practicing balance is very important to health.    Diabetes Mellitus and Exercise Exercising regularly is important for your overall health, especially when you have diabetes (diabetes mellitus). Exercising is not only about losing weight. It has many health benefits, such as increasing muscle strength and bone density and reducing body fat and stress. This leads to improved fitness, flexibility, and endurance, all of which result in better overall health. Exercise has additional benefits for people with diabetes, including:  Reducing appetite.  Helping to lower and control blood glucose.  Lowering blood pressure.  Helping to control amounts of fatty substances (lipids) in the blood, such as cholesterol and triglycerides.  Helping the body to respond better to insulin (improving insulin sensitivity).  Reducing how much insulin the body needs.  Decreasing the risk for heart disease by: ? Lowering cholesterol and triglyceride levels. ? Increasing the levels of good cholesterol. ? Lowering blood glucose levels.  What is my activity plan? Your health care provider or certified diabetes educator can help you make a plan for the type and frequency of exercise  (activity plan) that works for you. Make sure that you:  Do at least 150 minutes of moderate-intensity or vigorous-intensity exercise each week. This could be brisk walking, biking, or water aerobics. ? Do stretching and strength exercises, such as yoga or weightlifting, at least 2 times a week. ? Spread out your activity over at least 3 days of the week.  Get some form of physical activity every day. ? Do not go more than 2 days in a row without some kind of physical activity. ? Avoid being inactive for more than 90 minutes at a time. Take frequent breaks to walk or stretch.  Choose a type of exercise or activity that you enjoy, and set realistic goals.  Start slowly, and gradually increase the intensity of your exercise over time.  What do I need to know about managing my diabetes?  Check your blood glucose before and after exercising. ? If your blood glucose is higher than 240 mg/dL (13.3 mmol/L) before you exercise, check your urine for ketones. If you have ketones in your urine, do not exercise until your blood glucose returns to normal.  Know the symptoms of low blood glucose (hypoglycemia) and how to treat it. Your risk for hypoglycemia increases during and after exercise. Common symptoms of hypoglycemia can include: ? Hunger. ? Anxiety. ? Sweating and feeling clammy. ? Confusion. ? Dizziness or feeling light-headed. ? Increased heart rate or palpitations. ? Blurry vision. ? Tingling or numbness around the mouth, lips, or tongue. ? Tremors or shakes. ? Irritability.  Keep a rapid-acting carbohydrate snack available before, during, and  after exercise to help prevent or treat hypoglycemia.  Avoid injecting insulin into areas of the body that are going to be exercised. For example, avoid injecting insulin into: ? The arms, when playing tennis. ? The legs, when jogging.  Keep records of your exercise habits. Doing this can help you and your health care provider adjust your  diabetes management plan as needed. Write down: ? Food that you eat before and after you exercise. ? Blood glucose levels before and after you exercise. ? The type and amount of exercise you have done. ? When your insulin is expected to peak, if you use insulin. Avoid exercising at times when your insulin is peaking.  When you start a new exercise or activity, work with your health care provider to make sure the activity is safe for you, and to adjust your insulin, medicines, or food intake as needed.  Drink plenty of water while you exercise to prevent dehydration or heat stroke. Drink enough fluid to keep your urine clear or pale yellow. This information is not intended to replace advice given to you by your health care provider. Make sure you discuss any questions you have with your health care provider. Document Released: 09/30/2003 Document Revised: 01/28/2016 Document Reviewed: 12/20/2015 Elsevier Interactive Patient Education  2018 [employer].

## 2018-01-18 NOTE — Progress Notes (Signed)
Diabetes Self-Management Education  Visit Type:  Follow-up  Appt. Start Time: 928 Appt. End Time: 2993  01/18/2018  [name], identified by name and date of birth, is a 52 y.o. female with a diagnosis of Diabetes: Type 2.     ASSESSMENT  Wt Readings from Last 3 Encounters:  01/01/18 189 lb (85.7 kg)  12/25/17 187 lb 9.6 oz (85.1 kg)  12/18/17 192 lb 1.6 oz (87.1 kg)   Brought food activity records that were reviewed with her. She is eating fish, rice, vegetables, fruits. Knew when she ate too much carb because her blood sugar was higher. This helped her know to eat a smaller portion next time. Making plans to restart walking. Was able to identify foods that increase blood sugar. We discussed how to work in favorite foods and still stay on her meal plan and control her blood sugar.   We reviewed safety when starting an exercise routine and how to prevent low blood sugar.  Meter download shows - average for 136 (207 last visit, decreased from 252 last week), no low blood sugars, minimum was 76, highest was 273, 90% in target, 10% above target. 78 readings in 30 days- checks two times a day or more   patient asked for another visit between now and when her A1C is due.   Diabetes Self-Management Education - 01/18/18 1400      Initial Visit   What type of meal plan do you follow?  carb controlled      Health Coping   How would you rate your overall health?  Good      Psychosocial Assessment   Patient Belief/Attitude about Diabetes  Motivated to manage diabetes    Self-management support  Doctor's office;Friends;CDE visits      Pre-Education Assessment   Patient understands incorporating nutritional management into lifestyle.  Needs Review    Patient undertands incorporating physical activity into lifestyle.  Needs Review    Patient understands using medications safely.  Needs Review    Patient understands monitoring blood glucose, interpreting and using results  Needs  Review    Patient understands prevention, detection, and treatment of acute complications.  Needs Review      Patient Education   Physical activity and exercise   Role of exercise on diabetes management, blood pressure control and cardiac health.;Identified with patient nutritional and/or medication changes necessary with exercise. discussed exercise precautions with diabetes    Monitoring  Taught/discussed recording of test results and interpretation of SMBG.    Acute complications  Taught treatment of hypoglycemia - the 15 rule.      Patient Self-Evaluation of Goals - Patient rates self as meeting previously set goals (% of time)   Nutrition  >75%      Outcomes   Program Status  Completed      Subsequent Visit   Since your last visit have you continued or begun to take your medications as prescribed?  Yes    Since your last visit have you had your blood pressure checked?  No    Since your last visit have you experienced any weight changes?  -- did not want to weigh today    Since your last visit, are you checking your blood glucose at least once a day?  Yes       Learning Objective:  Patient will have a greater understanding of diabetes self-management. Patient education plan is to attend individual and/or group sessions per assessed needs and concerns. My plan  to support myself in continuing these changes to care for my diabetes is to attend or contact:   Exercise ? [employer] -[phone]- www.curves.com ? [phone]- [email or web address].com ? Other: [facility]  Diabetes Support Groups Type 2 diabetes support group : 2nd Monday of every month from 6-7 PM at [address] [facility] conference room [phone]  Other  -doctor's office, CDE, Dietitian, pharmacist, church   Plan:   Patient Instructions  Your blood sugars are great! Keep up the great work.  Your goal for the next 4 weeks is to work on walking.  Record your laps or minutes  that you walk in the book.   Your Exercise Prescription:   RX:  At least 150 minutes a week of moderate intensity exercise. 30 minutes for 5 days Or 50 minutes for 3 days- every other day 45 minutes 3 and half days Walking, bicycling, stationary bicycling, dancing.  (moderate intensity means to get  a little out of breath)  Do resistance exercise at least 2 times a week.  This can be yoga poses or strength training where you lift your own weight (think leg lifts or toe raises)  or light weights like cans of beans with your arms.   Flexibility and balance exercise- safe stretching and practicing balance is very important to health.    Diabetes Mellitus and Exercise Exercising regularly is important for your overall health, especially when you have diabetes (diabetes mellitus). Exercising is not only about losing weight. It has many health benefits, such as increasing muscle strength and bone density and reducing body fat and stress. This leads to improved fitness, flexibility, and endurance, all of which result in better overall health. Exercise has additional benefits for people with diabetes, including:  Reducing appetite.  Helping to lower and control blood glucose.  Lowering blood pressure.  Helping to control amounts of fatty substances (lipids) in the blood, such as cholesterol and triglycerides.  Helping the body to respond better to insulin (improving insulin sensitivity).  Reducing how much insulin the body needs.  Decreasing the risk for heart disease by: ? Lowering cholesterol and triglyceride levels. ? Increasing the levels of good cholesterol. ? Lowering blood glucose levels.  What is my activity plan? Your health care provider or certified diabetes educator can help you make a plan for the type and frequency of exercise (activity plan) that works for you. Make sure that you:  Do at least 150 minutes of moderate-intensity or vigorous-intensity exercise each week.  This could be brisk walking, biking, or water aerobics. ? Do stretching and strength exercises, such as yoga or weightlifting, at least 2 times a week. ? Spread out your activity over at least 3 days of the week.  Get some form of physical activity every day. ? Do not go more than 2 days in a row without some kind of physical activity. ? Avoid being inactive for more than 90 minutes at a time. Take frequent breaks to walk or stretch.  Choose a type of exercise or activity that you enjoy, and set realistic goals.  Start slowly, and gradually increase the intensity of your exercise over time.  What do I need to know about managing my diabetes?  Check your blood glucose before and after exercising. ? If your blood glucose is higher than 240 mg/dL (13.3 mmol/L) before you exercise, check your urine for ketones. If you have ketones in your urine, do not exercise until your blood glucose returns to normal.  Know the symptoms of low blood glucose (hypoglycemia) and how to treat it. Your risk for hypoglycemia increases during and after exercise. Common symptoms of hypoglycemia can include: ? Hunger. ? Anxiety. ? Sweating and feeling clammy. ? Confusion. ? Dizziness or feeling light-headed. ? Increased heart rate or palpitations. ? Blurry vision. ? Tingling or numbness around the mouth, lips, or tongue. ? Tremors or shakes. ? Irritability.  Keep a rapid-acting carbohydrate snack available before, during, and after exercise to help prevent or treat hypoglycemia.  Avoid injecting insulin into areas of the body that are going to be exercised. For example, avoid injecting insulin into: ? The arms, when playing tennis. ? The legs, when jogging.  Keep records of your exercise habits. Doing this can help you and your health care provider adjust your diabetes management plan as needed. Write down: ? Food that you eat before and after you exercise. ? Blood glucose levels before and after you  exercise. ? The type and amount of exercise you have done. ? When your insulin is expected to peak, if you use insulin. Avoid exercising at times when your insulin is peaking.  When you start a new exercise or activity, work with your health care provider to make sure the activity is safe for you, and to adjust your insulin, medicines, or food intake as needed.  Drink plenty of water while you exercise to prevent dehydration or heat stroke. Drink enough fluid to keep your urine clear or pale yellow. This information is not intended to replace advice given to you by your health care provider. Make sure you discuss any questions you have with your health care provider. Document Released: 09/30/2003 Document Revised: 01/28/2016 Document Reviewed: 12/20/2015 Elsevier Interactive Patient Education  2018 [employer].       Expected Outcomes:  Demonstrated interest in learning. Expect positive outcomes  Education material provided: Carbohydrate counting sheet  If problems or questions, patient to contact team via:  Phone  Future DSME appointment: - 4-6 wks  .Marland KitchenMarland KitchenDebera Lat, RD 01/18/2018 2:47 PM.

## 2018-01-20 ENCOUNTER — Other Ambulatory Visit (HOSPITAL_COMMUNITY): Payer: Self-pay | Admitting: Cardiology

## 2018-01-28 ENCOUNTER — Other Ambulatory Visit: Payer: Self-pay | Admitting: Dietician

## 2018-01-28 DIAGNOSIS — E1165 Type 2 diabetes mellitus with hyperglycemia: Secondary | ICD-10-CM

## 2018-01-28 NOTE — Telephone Encounter (Signed)
Has the old contour meter and needs prescription for strips. Agreed to upgrade to the updated contour next meter and strips. Request strips and will leave meter at front desk for her. She says she has plenty of lancets.

## 2018-01-29 DIAGNOSIS — E113291 Type 2 diabetes mellitus with mild nonproliferative diabetic retinopathy without macular edema, right eye: Secondary | ICD-10-CM | POA: Diagnosis not present

## 2018-01-29 DIAGNOSIS — H40053 Ocular hypertension, bilateral: Secondary | ICD-10-CM | POA: Diagnosis not present

## 2018-01-29 DIAGNOSIS — H40023 Open angle with borderline findings, high risk, bilateral: Secondary | ICD-10-CM | POA: Diagnosis not present

## 2018-01-29 DIAGNOSIS — Z83511 Family history of glaucoma: Secondary | ICD-10-CM | POA: Diagnosis not present

## 2018-01-30 MED ORDER — GLUCOSE BLOOD VI STRP: ORAL_STRIP | 12 refills | Status: DC

## 2018-02-13 LAB — CUP PACEART REMOTE DEVICE CHECK
Battery Remaining Longevity: 92 mo
Battery Voltage: 2.99 V
Brady Statistic AP VP Percent: 3.65 %
Brady Statistic AP VS Percent: 0.02 %
Brady Statistic AS VP Percent: 96.29 %
Brady Statistic AS VS Percent: 0.04 %
Brady Statistic RA Percent Paced: 3.66 %
Brady Statistic RV Percent Paced: 99.92 %
Date Time Interrogation Session: 20190605153944
HighPow Impedance: 73 Ohm
Implantable Lead Implant Date: 20180524
Implantable Lead Implant Date: 20180524
Implantable Lead Implant Date: 20180524
Implantable Lead Location: 753858
Implantable Lead Location: 753859
Implantable Lead Location: 753860
Implantable Lead Model: 4398
Implantable Lead Model: 5076
Implantable Pulse Generator Implant Date: 20180524
Lead Channel Impedance Value: 161.5 Ohm
Lead Channel Impedance Value: 178.5 Ohm
Lead Channel Impedance Value: 178.5 Ohm
Lead Channel Impedance Value: 178.5 Ohm
Lead Channel Impedance Value: 199.5 Ohm
Lead Channel Impedance Value: 323 Ohm
Lead Channel Impedance Value: 323 Ohm
Lead Channel Impedance Value: 399 Ohm
Lead Channel Impedance Value: 399 Ohm
Lead Channel Impedance Value: 399 Ohm
Lead Channel Impedance Value: 418 Ohm
Lead Channel Impedance Value: 532 Ohm
Lead Channel Impedance Value: 570 Ohm
Lead Channel Impedance Value: 608 Ohm
Lead Channel Impedance Value: 608 Ohm
Lead Channel Impedance Value: 627 Ohm
Lead Channel Impedance Value: 665 Ohm
Lead Channel Impedance Value: 684 Ohm
Lead Channel Pacing Threshold Amplitude: 0.5 V
Lead Channel Pacing Threshold Amplitude: 0.75 V
Lead Channel Pacing Threshold Amplitude: 0.875 V
Lead Channel Pacing Threshold Pulse Width: 0.4 ms
Lead Channel Pacing Threshold Pulse Width: 0.4 ms
Lead Channel Pacing Threshold Pulse Width: 0.4 ms
Lead Channel Sensing Intrinsic Amplitude: 2.375 mV
Lead Channel Sensing Intrinsic Amplitude: 2.375 mV
Lead Channel Sensing Intrinsic Amplitude: 23 mV
Lead Channel Sensing Intrinsic Amplitude: 23 mV
Lead Channel Setting Pacing Amplitude: 1.25 V
Lead Channel Setting Pacing Amplitude: 1.75 V
Lead Channel Setting Pacing Amplitude: 2.5 V
Lead Channel Setting Pacing Pulse Width: 0.4 ms
Lead Channel Setting Pacing Pulse Width: 0.4 ms
Lead Channel Setting Sensing Sensitivity: 0.3 mV

## 2018-02-14 ENCOUNTER — Telehealth: Payer: Self-pay

## 2018-02-14 NOTE — Telephone Encounter (Signed)
Patient referred to [facility] clinic by Levander Campion, Device RN at last office visit with Dr Caryl Comes.  Spoke with patient and ICM intro given.  She agreed to monthly ICM follow up.  She has some other medical issues such as painful legs and diabetes that she is dealing with.  She does not have any fluid symptoms at this time.  1st ICM remote transmission scheduled for 03/07/2018.  She has monitor by bedside and explained the report will come in between 12 - 6 AM.  Provided ICM number and encouraged to call for fluid symptoms.

## 2018-02-15 ENCOUNTER — Encounter: Payer: Self-pay | Admitting: Dietician

## 2018-02-15 ENCOUNTER — Ambulatory Visit (INDEPENDENT_AMBULATORY_CARE_PROVIDER_SITE_OTHER): Payer: Medicare Other | Admitting: Dietician

## 2018-02-15 DIAGNOSIS — E118 Type 2 diabetes mellitus with unspecified complications: Secondary | ICD-10-CM | POA: Diagnosis not present

## 2018-02-15 DIAGNOSIS — Z6831 Body mass index (BMI) 31.0-31.9, adult: Secondary | ICD-10-CM

## 2018-02-15 DIAGNOSIS — Z713 Dietary counseling and surveillance: Secondary | ICD-10-CM

## 2018-02-15 DIAGNOSIS — E1165 Type 2 diabetes mellitus with hyperglycemia: Secondary | ICD-10-CM

## 2018-02-15 NOTE — Patient Instructions (Addendum)
You should make an appointment about March 13, 2018 to get your new A1C. Dr. Vilma Prader  You are doing fantastic at caring for your diabetes!   Keep up the great work!  Keep walking daily, checking your feet. You could consider talking to your doctor about the pneumonia vaccine.   Follow up with me in 6-12 months sooner if needed.   Susan Peck [phone]

## 2018-02-15 NOTE — Progress Notes (Signed)
Diabetes Self-Management Education  Visit Type:  Follow-up  Appt. Start Time: 1020 Appt. End Time: 1050  02/15/2018  [name], identified by name and date of birth, is a 52 y.o. female with a diagnosis of Diabetes:  .     ASSESSMENT  Lab Results  Component Value Date   HGBA1C 12.0 (A) 12/11/2017   Wt Readings from Last 5 Encounters:  02/15/18 185 lb 8 oz (84.1 kg)  01/01/18 189 lb (85.7 kg)  12/25/17 187 lb 9.6 oz (85.1 kg)  12/18/17 192 lb 1.6 oz (87.1 kg)  12/11/17 188 lb 12.8 oz (85.6 kg)    Susan Peck has been walking daily. Was able to identify foods that increase blood sugar. We discussed how to use the contour diabetes app to self manage her blood sugars and foot care today  Meter download shows - average for 128 (136 last visit), no low blood sugars, minimum was 86, highest was 218, 93.2% in target, 6.8% above target.  checks two times a day or more. Her blood sugars and weight are both lower.   Diabetes Self-Management Education - 02/15/18 1300      Health Coping   How would you rate your overall health?  Good      Complications   How often do you check your blood sugar?  3-4 times/day    Fasting Blood glucose range (mg/dL)  70-129    Postprandial Blood glucose range (mg/dL)  130-179    Number of hypoglycemic episodes per month  -- 0    Number of hyperglycemic episodes per week  1    Can you tell when your blood sugar is high?  Yes    What do you do if your blood sugar is high?  eat less carb    Have you had a dilated eye exam in the past 12 months?  Yes    Have you had a dental exam in the past 12 months?  Yes    Are you checking your feet?  Yes    How many days per week are you checking your feet?  7      Patient Self-Evaluation of Goals - Patient rates self as meeting previously set goals (% of time)   Nutrition  >75%      Post-Education Assessment   Patient understands incorporating nutritional management into lifestyle.  Demonstrates understanding  / competency    Patient undertands incorporating physical activity into lifestyle.  Demonstrates understanding / competency    Patient understands using medications safely.  Demonstrates understanding / competency    Patient understands monitoring blood glucose, interpreting and using results  Demonstrates understanding / competency    Patient understands prevention, detection, and treatment of acute complications.  Demonstrates understanding / competency     Learning Objective:  Patient will have a greater understanding of diabetes self-management. Patient education plan is to attend individual and/or group sessions per assessed needs and concerns. My plan to support myself in continuing these changes to care for my diabetes is to attend or contact:   Diabetes Support Groups Type 2 diabetes support group : 2nd Monday of every month from 6-7 PM at [address] [facility] conference room [phone]  Other  -doctor's office, CDE, Dietitian, pharmacist, church   Plan:   Patient Instructions  You should make an appointment about March 13, 2018 to get your new A1C. Dr. Vilma Prader  You are doing fantastic at caring for your diabetes!   Keep up the  great work!  Keep walking daily, checking your feet. You could consider talking to your doctor about the pneumonia vaccine.   Follow up with me in 6-12 months sooner if needed.   Susan Peck [phone]   Expected Outcomes:     Education material provided: Carbohydrate counting sheet  If problems or questions, patient to contact team via:  Phone  Future DSME appointment: -    .Marland KitchenMarland KitchenButch Penny Peck, RD 02/15/2018 1:32 PM.

## 2018-02-20 ENCOUNTER — Ambulatory Visit (INDEPENDENT_AMBULATORY_CARE_PROVIDER_SITE_OTHER): Payer: Medicare Other | Admitting: Internal Medicine

## 2018-02-20 ENCOUNTER — Ambulatory Visit (HOSPITAL_COMMUNITY)
Admission: RE | Admit: 2018-02-20 | Discharge: 2018-02-20 | Disposition: A | Discharge status: Home / Self Care | Payer: Medicare Other | Source: Ambulatory Visit

## 2018-02-20 ENCOUNTER — Other Ambulatory Visit: Payer: Self-pay

## 2018-02-20 ENCOUNTER — Emergency Department (HOSPITAL_COMMUNITY)
Admission: EM | Admit: 2018-02-20 | Discharge: 2018-02-20 | Disposition: A | Discharge status: Home / Self Care | Payer: Medicare Other | Attending: Emergency Medicine | Admitting: Emergency Medicine

## 2018-02-20 ENCOUNTER — Encounter: Payer: Self-pay | Admitting: Internal Medicine

## 2018-02-20 ENCOUNTER — Encounter (HOSPITAL_COMMUNITY): Payer: Self-pay

## 2018-02-20 ENCOUNTER — Emergency Department (HOSPITAL_COMMUNITY): Payer: Medicare Other

## 2018-02-20 VITALS — BP 135/76 | HR 76 | Temp 98.0°F | Ht 64.0 in | Wt 187.4 lb

## 2018-02-20 DIAGNOSIS — E119 Type 2 diabetes mellitus without complications: Secondary | ICD-10-CM | POA: Diagnosis not present

## 2018-02-20 DIAGNOSIS — R9431 Abnormal electrocardiogram [ECG] [EKG]: Secondary | ICD-10-CM | POA: Insufficient documentation

## 2018-02-20 DIAGNOSIS — Z95810 Presence of automatic (implantable) cardiac defibrillator: Secondary | ICD-10-CM | POA: Insufficient documentation

## 2018-02-20 DIAGNOSIS — I5022 Chronic systolic (congestive) heart failure: Secondary | ICD-10-CM | POA: Diagnosis not present

## 2018-02-20 DIAGNOSIS — R1011 Right upper quadrant pain: Secondary | ICD-10-CM

## 2018-02-20 DIAGNOSIS — R1012 Left upper quadrant pain: Secondary | ICD-10-CM | POA: Insufficient documentation

## 2018-02-20 DIAGNOSIS — J45909 Unspecified asthma, uncomplicated: Secondary | ICD-10-CM | POA: Insufficient documentation

## 2018-02-20 DIAGNOSIS — R1013 Epigastric pain: Secondary | ICD-10-CM

## 2018-02-20 DIAGNOSIS — R1010 Upper abdominal pain, unspecified: Secondary | ICD-10-CM

## 2018-02-20 DIAGNOSIS — K5900 Constipation, unspecified: Secondary | ICD-10-CM | POA: Diagnosis not present

## 2018-02-20 DIAGNOSIS — R1084 Generalized abdominal pain: Secondary | ICD-10-CM | POA: Diagnosis not present

## 2018-02-20 DIAGNOSIS — Z9851 Tubal ligation status: Secondary | ICD-10-CM | POA: Diagnosis not present

## 2018-02-20 DIAGNOSIS — R1032 Left lower quadrant pain: Secondary | ICD-10-CM

## 2018-02-20 DIAGNOSIS — I110 Hypertensive heart disease with heart failure: Secondary | ICD-10-CM | POA: Insufficient documentation

## 2018-02-20 LAB — URINALYSIS, ROUTINE W REFLEX MICROSCOPIC
Bilirubin Urine: NEGATIVE
GLUCOSE, UA: NEGATIVE mg/dL
HGB URINE DIPSTICK: NEGATIVE
KETONES UR: NEGATIVE mg/dL
LEUKOCYTES UA: NEGATIVE
Nitrite: NEGATIVE
PH: 6 (ref 5.0–8.0)
PROTEIN: NEGATIVE mg/dL
SPECIFIC GRAVITY, URINE: 1.024 (ref 1.005–1.030)

## 2018-02-20 LAB — CBC
HEMATOCRIT: 42.4 % (ref 36.0–46.0)
Hemoglobin: 14 g/dL (ref 12.0–15.0)
MCH: 27.2 pg (ref 26.0–34.0)
MCHC: 33 g/dL (ref 30.0–36.0)
MCV: 82.3 fL (ref 78.0–100.0)
Platelets: 251 10*3/uL (ref 150–400)
RBC: 5.15 MIL/uL — AB (ref 3.87–5.11)
RDW: 12.4 % (ref 11.5–15.5)
WBC: 7.7 10*3/uL (ref 4.0–10.5)

## 2018-02-20 LAB — COMPREHENSIVE METABOLIC PANEL
ALK PHOS: 117 U/L (ref 38–126)
ALT: 22 U/L (ref 0–44)
ANION GAP: 9 (ref 5–15)
AST: 23 U/L (ref 15–41)
Albumin: 3.8 g/dL (ref 3.5–5.0)
BILIRUBIN TOTAL: 0.6 mg/dL (ref 0.3–1.2)
BUN: 19 mg/dL (ref 6–20)
CO2: 28 mmol/L (ref 22–32)
Calcium: 9.5 mg/dL (ref 8.9–10.3)
Chloride: 105 mmol/L (ref 98–111)
Creatinine, Ser: 0.59 mg/dL (ref 0.44–1.00)
GFR calc Af Amer: >60 mL/min (ref >60)
GLUCOSE: 145 mg/dL — AB (ref 70–99)
POTASSIUM: 3.7 mmol/L (ref 3.5–5.1)
Sodium: 142 mmol/L (ref 135–145)
TOTAL PROTEIN: 7.5 g/dL (ref 6.5–8.1)

## 2018-02-20 LAB — LIPASE, BLOOD: Lipase: 43 U/L (ref 11–51)

## 2018-02-20 MED ORDER — HYDROMORPHONE HCL 1 MG/ML IJ SOLN
1.0000 mg | Freq: Once | INTRAMUSCULAR | Status: AC
  Administered 2018-02-20: 1 mg via INTRAVENOUS
  Filled 2018-02-20: qty 1

## 2018-02-20 MED ORDER — POLYETHYLENE GLYCOL 3350 17 G PO PACK: 17.0000 g | PACK | Freq: Every day | ORAL | 0 refills | Status: DC

## 2018-02-20 MED ORDER — IOHEXOL 300 MG/ML  SOLN
100.0000 mL | Freq: Once | INTRAMUSCULAR | Status: AC | PRN
  Administered 2018-02-20: 100 mL via INTRAVENOUS

## 2018-02-20 MED ORDER — FENTANYL CITRATE (PF) 100 MCG/2ML IJ SOLN
50.0000 ug | Freq: Once | INTRAMUSCULAR | Status: AC
  Administered 2018-02-20: 50 ug via INTRAVENOUS
  Filled 2018-02-20: qty 2

## 2018-02-20 NOTE — ED Provider Notes (Signed)
Patient placed in Quick Look pathway, seen and evaluated   Chief Complaint: upper abd pain x3 days  HPI:   Pt is a 52 y.o. female with a PMHx of CHF, asthma, GERD, HTN, DM2, and other medical conditions, and PSHx of hysterectomy, presenting today with c/o upper abd pain x3 days that radiates to upper back area. Reports feeling constipated, no BM in a couple days which she states has been a chronic issue for a while. Tried Mag Citrate without relief. Feels bloated. Denies n/v, fevers/chills, CP, SOB, diarrhea, melena, hematochezia, obstipation, urinary symptoms, or vaginal symptoms. Seen by [facility] just [facility], pt states she had bedside U/S and they told her they "couldn't see the gallbladder".   ROS: +upper abd pain and back pain. +Constipation. No fevers/chills, CP, SOB, n/v, diarrhea, melena, hematochezia, obstipation, urinary/vaginal symptoms  Physical Exam:  BP (!) 143/96 (BP Location: Right Arm)   Pulse 85   Temp 98.2 F (36.8 C) (Oral)   Resp 18   Ht 5\' 4"  (1.626 m)   Wt 84.8 kg (187 lb)   SpO2 100%   BMI 32.10 kg/m    Gen: No distress, on cell phone throughout most of interview  Neuro: Awake and Alert  Skin: Warm    Focused Exam: Abd: Soft, nondistended, +BS throughout, with mild upper abd TTP mostly in RUQ and epigastric area, no r/g/r, +pain elicited with murphy's although still able to mostly fully inspire, neg mcburney's, no CVA TTP    Initiation of care has begun. The patient has been counseled on the process, plan, and necessity for staying for the completion/evaluation, and the remainder of the medical screening examination     [address] [place], [place] 02/20/18 1738    Mesner, Corene Cornea, MD 02/20/18 2015

## 2018-02-20 NOTE — Patient Instructions (Signed)
I will write for daily miralax to keep you regular from now on.  For now we will have you go to the emergency department for further evaluation of your abdominal/back pain.  They will likely due some imaging to help evaluate you further.

## 2018-02-20 NOTE — ED Notes (Signed)
Called X 3 (possible in [place]?)

## 2018-02-20 NOTE — Discharge Instructions (Addendum)

## 2018-02-20 NOTE — ED Provider Notes (Signed)
Emergency Department Provider Note   I have reviewed the triage vital signs and the nursing notes.   HISTORY  Chief Complaint Abdominal Pain and Back Pain   HPI Susan Peck is a 52 y.o. female with multiple medical problems documented below but most notably for history of adhesions after a hysterectomy the presents emerged from today with worsening abdominal pain.  Patient states is been going on for a few days progressively worsening but she has had a bowel movement in a week.  She had decreased gas during that time as well.decreased and dark urine but no other urinary symptoms.  No other associated or modifying symptoms.    Past Medical History:  Diagnosis Date  . AICD (automatic cardioverter/defibrillator) present 12/13/2016  . Anxiety    no meds  . Asthma   . CHF (congestive heart failure) ([place])   . Depression    no meds  . Diabetes mellitus    recent dx 11/17/11 - started med 11/18/11 type 2  . GERD (gastroesophageal reflux disease)   . Goiter 07/27/2011   non-neoplastic goiter - fine needle aspiration - benign sees dr Dwyane Dee for  . Heart murmur    dx 2 yrs ago per pt  . Herpes genitalis in women   . Hypertension   . IBS (irritable bowel syndrome)    tx with diet per pt  . Low back pain    history  . Obesity   . Seasonal allergies   . Shortness of breath    occasional - exercise induced  . Sleep apnea    cpap broken  . Systolic heart failure    May 2011 EF 35-40%, 04/03/11 EF 25-30%, 06/27/11 EF 30-35%    Patient Active Problem List   Diagnosis Date Noted  . Pain of upper abdomen 02/20/2018  . Grief reaction 12/12/2017  . Asthma 06/12/2017  . Healthcare maintenance 06/06/2017  . Right shoulder pain 06/05/2017  . Chronic low back pain 04/03/2017  . NICM (nonischemic cardiomyopathy) ([place]) 12/13/2016  . LBBB (left bundle branch block) 12/13/2016  . Chronic pansinusitis 09/20/2016  . Laryngopharyngeal reflux (LPR) 09/20/2016  . HLD (hyperlipidemia)  08/18/2016  . History of colonic polyps 03/08/2016  . Chronic hip pain 12/28/2015  . Diabetes mellitus type II, uncontrolled ([place]) 10/05/2015  . HTN (hypertension) 09/17/2014  . Perimenopause 04/15/2013  . Goiter 07/10/2011  . Obstructive sleep apnea 05/29/2011  . Chronic systolic heart failure ([place]) 04/10/2011    Past Surgical History:  Procedure Laterality Date  . ABDOMINAL HYSTERECTOMY    . BIV ICD INSERTION CRT-D N/A 12/13/2016   Procedure: BiV ICD Insertion CRT-D;  Surgeon: Deboraha Sprang, MD;  Location: [facility];  Service: Cardiovascular;  Laterality: N/A;  . cardiac catherization  2004   [place], [place] - Dr Lynnell Jude  . CARDIAC CATHETERIZATION    . CARDIAC CATHETERIZATION N/A 09/15/2015   Procedure: Right/Left Heart Cath and Coronary Angiography;  Surgeon: Jolaine Artist, MD;  Location: [facility];  Service: Cardiovascular;  Laterality: N/A;  . COLONOSCOPY WITH PROPOFOL N/A 03/26/2015   Procedure: COLONOSCOPY WITH PROPOFOL;  Surgeon: Carol Ada, MD;  Location: [facility];  Service: Endoscopy;  Laterality: N/A;  . colonscopy    . CYSTOSCOPY  11/23/2011   Procedure: CYSTOSCOPY;  Surgeon: Jolayne Haines, MD;  Location: [facility];  Service: Gynecology;  Laterality: N/A;  . DIAGNOSTIC LAPAROSCOPY     of pelvis  . [place] OF UTERUS  12/2006,  10/2005  hysteroscopy surgery x 2  . INSERTION OF ICD  12/13/2016   BIV  . LAPAROSCOPIC ASSISTED VAGINAL HYSTERECTOMY  11/23/2011   Procedure: LAPAROSCOPIC ASSISTED VAGINAL HYSTERECTOMY;  Surgeon: Jolayne Haines, MD;  Location: [facility];  Service: Gynecology;  Laterality: N/A;  . NOVASURE ABLATION     10/2005  . SALPINGOOPHORECTOMY  11/23/2011   Procedure: SALPINGO OOPHERECTOMY;  Surgeon: Jolayne Haines, MD;  Location: [facility];  Service: Gynecology;  Laterality: Bilateral;  . SVD     x 3  . TOOTH EXTRACTION N/A 04/05/2017   Procedure: DENTAL EXTRACTIONS OF TEETH FIVE, SEVEN, SIXTEEN, SEVENTEEN;  Surgeon: Diona Browner, DDS;  Location: [facility];  Service: Oral Surgery;  Laterality: N/A;  . TUBAL LIGATION    . WISDOM TOOTH EXTRACTION      Current Outpatient Rx  . Order #: [id number] Class: Print  . Order #: [id number] Class: Historical Med  . Order #: [id number] Class: Normal  . Order #: [id number] Class: Normal  . Order #: [id number] Class: Normal  . Order #: [id number] Class: Normal  . Order #: [id number] Class: Normal  . Order #: [id number] Class: Normal  . Order #: [id number] Class: Normal  . Order #: [id number] Class: Normal  . Order #: [id number] Class: Normal  . Order #: [id number] Class: Normal  . Order #: [id number] Class: Normal  . Order #: [id number] Class: Historical Med  . Order #: [id number] Class: Historical Med  . Order #: [id number] Class: Normal  . Order #: [id number] Class: Normal  . Order #: [id number] Class: Normal    Allergies Shrimp [shellfish allergy]  Family History  Problem Relation Age of Onset  . Heart disease Maternal Aunt   . Breast cancer Mother   . Thyroid disease Mother   . Hypertension Maternal Grandmother   . Diabetes Maternal Grandmother   . Breast cancer Maternal Aunt   . Heart disease Maternal Aunt   . Diabetes Maternal Grandfather   . Diabetes Paternal Grandmother   . Diabetes Paternal Grandfather   . Hypertension Paternal Grandfather     Social History Social History   Tobacco Use  . Smoking status: Never Smoker  . Smokeless tobacco: Never Used  Substance Use Topics  . Alcohol use: No    Alcohol/week: 0.0 oz  . Drug use: No    Review of Systems  All other systems negative except as documented in the HPI. All pertinent positives and negatives as reviewed in the HPI. ____________________________________________   PHYSICAL EXAM:  VITAL SIGNS: ED Triage Vitals  Enc Vitals Group     BP 02/20/18 1717 (!) 143/96     Pulse Rate 02/20/18 1717 85     Resp 02/20/18 1717 18     Temp 02/20/18 1717 98.2 F (36.8 C)     Temp Source 02/20/18 1717 Oral     SpO2 02/20/18  1717 100 %     Weight 02/20/18 1718 187 lb (84.8 kg)     Height 02/20/18 1718 5\' 4"  (1.626 m)     Head Circumference --      Peak Flow --      Pain Score 02/20/18 1718 10     Pain Loc --      Pain Edu? --      Excl. in [place]? --     Constitutional: Alert and oriented. Well appearing and in no acute distress. Eyes: Conjunctivae are normal. PERRL. EOMI. Head: Atraumatic. Nose: No congestion/rhinnorhea. Mouth/Throat: Mucous membranes are moist.  Oropharynx non-erythematous. Neck: No stridor.  No meningeal signs.   Cardiovascular: Normal rate, regular rhythm. Good peripheral  circulation. Grossly normal heart sounds.   Respiratory: Normal respiratory effort.  No retractions. Lungs CTAB. Gastrointestinal: Soft and nontender. Mild distention. Mildly tympanic to percussion  Musculoskeletal: No lower extremity tenderness nor edema. No gross deformities of extremities. Neurologic:  Normal speech and language. No gross focal neurologic deficits are appreciated.  Skin:  Skin is warm, dry and intact. No rash noted.  ____________________________________________   LABS (all labs ordered are listed, but only abnormal results are displayed)  Labs Reviewed  COMPREHENSIVE METABOLIC PANEL - Abnormal; Notable for the following components:      Result Value   Glucose, Bld 145 (*)    All other components within normal limits  CBC - Abnormal; Notable for the following components:   RBC 5.15 (*)    All other components within normal limits  LIPASE, BLOOD  URINALYSIS, ROUTINE W REFLEX MICROSCOPIC   ____________________________________________  EKG   EKG Interpretation  Date/Time:    Ventricular Rate:    PR Interval:    QRS Duration:   QT Interval:    QTC Calculation:   R Axis:     Text Interpretation:         ____________________________________________  RADIOLOGY  US Abdomen Complete  Result Date: 02/20/2018 CLINICAL DATA:  Upper abdominal pain EXAM: ABDOMEN ULTRASOUND COMPLETE  COMPARISON:  None. FINDINGS: Gallbladder: Contracted without evidence of cholelithiasis. No pericholecystic fluid is noted. Common bile duct: Diameter: 3.6 mm. Liver: No focal lesion identified. Within normal limits in parenchymal echogenicity. Portal vein is patent on color Doppler imaging with normal direction of blood flow towards the liver. IVC: No abnormality visualized. Pancreas: Visualized portion unremarkable. Spleen: Size and appearance within normal limits. Right Kidney: Length: 11.9 cm. Echogenicity within normal limits. No mass or hydronephrosis visualized. Left Kidney: Length: 12.5 cm. Echogenicity within normal limits. No mass or hydronephrosis visualized. Abdominal aorta: No aneurysm visualized. Other findings: None. IMPRESSION: No acute abnormality noted. Electronically Signed   By: Inez Catalina M.D.   On: 02/20/2018 19:48    ____________________________________________   PROCEDURES  Procedure(s) performed:   Procedures   ____________________________________________   INITIAL IMPRESSION / ASSESSMENT AND PLAN / ED COURSE  Will eval for bowel obstruction with ct. [place] negative. Labs good. If CT ok, will dc.      Pertinent labs & imaging results that were available during my care of the patient were reviewed by me and considered in my medical decision making (see chart for details).  ____________________________________________  FINAL CLINICAL IMPRESSION(S) / ED DIAGNOSES  Final diagnoses:  Generalized abdominal pain     MEDICATIONS GIVEN DURING THIS VISIT:  Medications  fentaNYL (SUBLIMAZE) injection 50 mcg (has no administration in time range)     NEW OUTPATIENT MEDICATIONS STARTED DURING THIS VISIT:  New Prescriptions   No medications on file    Note:  This note was prepared with assistance of Dragon voice recognition software. Occasional wrong-word or sound-a-like substitutions may have occurred due to the inherent limitations of voice recognition  software.   Merrily Pew, MD 02/22/18 [id number]

## 2018-02-20 NOTE — Progress Notes (Signed)
CC: upper abdominal pain, constipation  HPI:  [name] is a 52 y.o. female with PMH below here to address acute upper abdominal pain and constipation.      Please see A&P for status of the patient's chronic medical conditions  Past Medical History:  Diagnosis Date  . AICD (automatic cardioverter/defibrillator) present 12/13/2016  . Anxiety    no meds  . Asthma   . CHF (congestive heart failure) ([place])   . Depression    no meds  . Diabetes mellitus    recent dx 11/17/11 - started med 11/18/11 type 2  . GERD (gastroesophageal reflux disease)   . Goiter 07/27/2011   non-neoplastic goiter - fine needle aspiration - benign sees dr Dwyane Dee for  . Heart murmur    dx 2 yrs ago per pt  . Herpes genitalis in women   . Hypertension   . IBS (irritable bowel syndrome)    tx with diet per pt  . Low back pain    history  . Obesity   . Seasonal allergies   . Shortness of breath    occasional - exercise induced  . Sleep apnea    cpap broken  . Systolic heart failure    May 2011 EF 35-40%, 04/03/11 EF 25-30%, 06/27/11 EF 30-35%   Review of Systems:  ROS: Pulmonary: pt denies increased work of breathing, endorses some SOB due to abdominal pain  Cardiac: pt denies palpitations, chest pain,  Abdominal: pt endorses abdominal pain mainly in the upper and left lower quadrants, denies nausea or vomiting, endorses constipation.    Physical Exam:  Vitals:   02/20/18 1544  BP: 135/76  Pulse: 76  Temp: 98 F (36.7 C)  TempSrc: Oral  SpO2: 100%  Weight: 187 lb 6.4 oz (85 kg)  Height: 5\' 4"  (1.626 m)   Physical Exam  Constitutional: No distress.  Cardiovascular: Normal rate, regular rhythm and normal heart sounds. Exam reveals no gallop and no friction rub.  No murmur heard. Pulmonary/Chest: Effort normal and breath sounds normal. No respiratory distress. She has no wheezes. She has no rales. She exhibits no tenderness.  Abdominal: Soft. She exhibits distension. She exhibits no  mass. Bowel sounds are increased. There is tenderness in the right upper quadrant, epigastric area, left upper quadrant and left lower quadrant. There is no rebound and no guarding.  Patient's abdomen is soft and distended degree of which is difficult to say given obesity.  She has predominantly left upper and lower quadrant tenderness and gets less tender as you palpate across the upper abdoment to the RUQ.  There is no LLQ tenderness present.  When you palpate the left side she reports some radiation to her back.    Neurological: She is alert.  Skin: She is not diaphoretic.    Social History   Socioeconomic History  . Marital status: Divorced    Spouse name: Not on file  . Number of children: Y  . Years of education: Not on file  . Highest education level: Not on file  Occupational History  . Occupation: Surveyor, quantity: [employer]    Comment: and Systems analyst  Social Needs  . Financial resource strain: Not on file  . Food insecurity:    Worry: Not on file    Inability: Not on file  . Transportation needs:    Medical: Not on file    Non-medical: Not on file  Tobacco Use  . Smoking status: Never Smoker  .  Smokeless tobacco: Never Used  Substance and Sexual Activity  . Alcohol use: No    Alcohol/week: 0.0 oz  . Drug use: No  . Sexual activity: Never    Birth control/protection: Surgical    Comment: des neg, intercourse age 11, more than sexual parterns  Lifestyle  . Physical activity:    Days per week: Not on file    Minutes per session: Not on file  . Stress: Not on file  Relationships  . Social connections:    Talks on phone: Not on file    Gets together: Not on file    Attends religious service: Not on file    Active member of club or organization: Not on file    Attends meetings of clubs or organizations: Not on file    Relationship status: Not on file  . Intimate partner violence:    Fear of current or ex partner: Not on file    Emotionally abused:  Not on file    Physically abused: Not on file    Forced sexual activity: Not on file  Other Topics Concern  . Not on file  Social History Narrative   She lives with her husband and son.  She is works for [employer].    Family History  Problem Relation Age of Onset  . Heart disease Maternal Aunt   . Breast cancer Mother   . Thyroid disease Mother   . Hypertension Maternal Grandmother   . Diabetes Maternal Grandmother   . Breast cancer Maternal Aunt   . Heart disease Maternal Aunt   . Diabetes Maternal Grandfather   . Diabetes Paternal Grandmother   . Diabetes Paternal Grandfather   . Hypertension Paternal Grandfather     Assessment & Plan:   See Encounters Tab for problem based charting.  Patient seen with Dr. Evette Doffing

## 2018-02-20 NOTE — ED Triage Notes (Signed)
Pt states that she has been having stomach and back pain X3-4 days. Pt was at internal medicine, states they tried to do a [place] but were unable to visualize gallbladder.

## 2018-02-20 NOTE — ED Notes (Signed)
Called for Pt, Pt is not present in the waiting room.

## 2018-02-20 NOTE — ED Notes (Signed)
Called X 3 (possible in ultrasound?)

## 2018-02-21 DIAGNOSIS — K5900 Constipation, unspecified: Secondary | ICD-10-CM | POA: Insufficient documentation

## 2018-02-21 NOTE — Progress Notes (Signed)
Internal Medicine Clinic Attending  I saw and evaluated the patient.  I personally confirmed the key portions of the history and exam documented by Dr. Winfrey and I reviewed pertinent patient test results.  The assessment, diagnosis, and plan were formulated together and I agree with the documentation in the resident's note. 

## 2018-02-21 NOTE — Assessment & Plan Note (Addendum)
Hurts in "torso area: feels like gas pain ongoing cramping pain.  Discomfort started Monday and has worsened.  Feels like she's nine months pregnant.  No nausea or vomiting, no decrease in appetite.  Constipated for over a week.  Drank a bottle of mag citrate Sunday and a whole bottle today with no bowel movement passing very little gas.  Never had symptoms like this before.  Had abdominal surgeries tubal ligation 28 years ago after pregnancy scar tissue grew and they had to remove due to pain.  Says hernia on left but was small.  No recent trauma or falls.  No fevers or chills, no chest pain, some SOB due to pain and discomfort.  Not having heartburn takes ranitidine. Patient reported pain radiating to back. We ultrasounded the patient and were able to appreciate normal kidneys, no dilation or aorta or signs of dissection.  Bowel gas obscured view of gallbladder and cbd.  We performed an ecg given her cardiac history that was unchanged from prior with no evidence of ischemia.    -offered pt treatment for pain and severe constipation -pt declined and opted to go to the emergency department for further evaluation -reviewed chart workup in ED was unremarkable, included labs, [place] and CT abd/pelv with contrast

## 2018-02-21 NOTE — Assessment & Plan Note (Signed)
Feels like she's nine months pregnant.  No nausea or vomiting, no decrease in appetite.  Constipated for over a week.  Drank a bottle of mag citrate Sunday and a whole bottle today with no bowel movement passing very little gas.  -needs to be on a daily osmotic laxative -chart review shows this was ordered by ED provider

## 2018-03-04 ENCOUNTER — Other Ambulatory Visit: Payer: Self-pay | Admitting: Student in an Organized Health Care Education/Training Program

## 2018-03-04 DIAGNOSIS — E1165 Type 2 diabetes mellitus with hyperglycemia: Secondary | ICD-10-CM

## 2018-03-07 ENCOUNTER — Ambulatory Visit (INDEPENDENT_AMBULATORY_CARE_PROVIDER_SITE_OTHER): Payer: Medicare Other

## 2018-03-07 ENCOUNTER — Other Ambulatory Visit: Payer: Self-pay | Admitting: Internal Medicine

## 2018-03-07 DIAGNOSIS — Z95810 Presence of automatic (implantable) cardiac defibrillator: Secondary | ICD-10-CM

## 2018-03-07 DIAGNOSIS — E1165 Type 2 diabetes mellitus with hyperglycemia: Secondary | ICD-10-CM

## 2018-03-07 DIAGNOSIS — I5022 Chronic systolic (congestive) heart failure: Secondary | ICD-10-CM | POA: Diagnosis not present

## 2018-03-07 NOTE — Progress Notes (Signed)
EPIC Encounter for ICM Monitoring  Patient Name: Susan Peck is a 52 y.o. female Date: 03/07/2018 Primary Care Physican: Annie Paras Andree Elk, MD Primary Cardiologist: [place] Electrophysiologist: Faustino Congress Weight: 187 lbs (184-195 lbs) Bi-V Pacing:   99.9%       1st ICM transmission. Heart Failure questions reviewed, pt symptomatic with weight gain of ~3 lbs in the last 2 days.  She forgot to take Furosemide last night and has not taken any today yet.     Thoracic impedance abnormal suggesting fluid accumulation starting 03/02/2018.  Prescribed dosage: Furosemide 40 mg take 2 tablets (80 mg total) every morning and 1 tablet (40 mg total) every evening.  Labs: 02/20/2018 Creatinine 0.59, BUN 19, Potassium 3.7, Sodium 142 09/25/2017 Creatinine 1.07, BUN 20, Potassium 4.2, Sodium 136, EGFR 69.27  Recommendations: Advised to take Furosemide 80 mg bid x 3 days and then return to prescribed dosage of 80 mg every morning and 40 mg every evening.   Follow-up plan: ICM clinic phone appointment on 03/11/2018 (manual send) to recheck fluid levels.       Copy of ICM check sent to Dr. Haroldine Laws and Dr Caryl Comes.   3 month ICM trend: 03/07/2018    1 Year ICM trend:       [name], RN 03/07/2018 12:38 PM

## 2018-03-08 ENCOUNTER — Other Ambulatory Visit: Payer: Self-pay | Admitting: Dietician

## 2018-03-08 ENCOUNTER — Encounter: Payer: Self-pay | Admitting: Internal Medicine

## 2018-03-08 ENCOUNTER — Telehealth: Payer: Self-pay | Admitting: Dietician

## 2018-03-08 DIAGNOSIS — E1165 Type 2 diabetes mellitus with hyperglycemia: Secondary | ICD-10-CM

## 2018-03-08 MED ORDER — GLUCOSE BLOOD VI STRP: ORAL_STRIP | 12 refills | Status: DC

## 2018-03-08 MED ORDER — INSULIN DEGLUDEC-LIRAGLUTIDE 100-3.6 UNIT-MG/ML ~~LOC~~ SOPN: 12.0000 [IU] | PEN_INJECTOR | Freq: Every day | SUBCUTANEOUS | 1 refills | Status: DC

## 2018-03-08 MED ORDER — ONETOUCH DELICA LANCETS FINE MISC: 12 refills | Status: AC

## 2018-03-08 NOTE — Telephone Encounter (Signed)
Susan Peck says she changed her insurance and now her insurance is not covering some of her diabetes supplies. I told her to call the customer service number on the back of her card or her pharmacy. She is coming over to pick up a sample of xultophy today.

## 2018-03-08 NOTE — Telephone Encounter (Signed)
Testing supplies for new meter due to new insurance company.

## 2018-03-11 ENCOUNTER — Ambulatory Visit (INDEPENDENT_AMBULATORY_CARE_PROVIDER_SITE_OTHER): Payer: Medicare Other

## 2018-03-11 ENCOUNTER — Telehealth: Payer: Self-pay

## 2018-03-11 DIAGNOSIS — I5022 Chronic systolic (congestive) heart failure: Secondary | ICD-10-CM

## 2018-03-11 DIAGNOSIS — Z95810 Presence of automatic (implantable) cardiac defibrillator: Secondary | ICD-10-CM

## 2018-03-11 NOTE — Telephone Encounter (Signed)
Spoke with pt and reminded pt of remote transmission that is due today. Pt verbalized understanding.   

## 2018-03-12 ENCOUNTER — Telehealth: Payer: Self-pay

## 2018-03-12 ENCOUNTER — Other Ambulatory Visit: Payer: Self-pay

## 2018-03-12 ENCOUNTER — Encounter: Payer: Self-pay | Admitting: Internal Medicine

## 2018-03-12 ENCOUNTER — Other Ambulatory Visit: Payer: Self-pay | Admitting: *Deleted

## 2018-03-12 ENCOUNTER — Ambulatory Visit (INDEPENDENT_AMBULATORY_CARE_PROVIDER_SITE_OTHER): Payer: Medicare Other | Admitting: Internal Medicine

## 2018-03-12 VITALS — BP 127/74 | HR 68 | Temp 97.8°F | Ht 64.0 in | Wt 187.0 lb

## 2018-03-12 DIAGNOSIS — I10 Essential (primary) hypertension: Secondary | ICD-10-CM

## 2018-03-12 DIAGNOSIS — M79605 Pain in left leg: Secondary | ICD-10-CM

## 2018-03-12 DIAGNOSIS — K5909 Other constipation: Secondary | ICD-10-CM | POA: Diagnosis not present

## 2018-03-12 DIAGNOSIS — E118 Type 2 diabetes mellitus with unspecified complications: Secondary | ICD-10-CM | POA: Diagnosis not present

## 2018-03-12 DIAGNOSIS — M79604 Pain in right leg: Secondary | ICD-10-CM

## 2018-03-12 DIAGNOSIS — G2581 Restless legs syndrome: Secondary | ICD-10-CM | POA: Insufficient documentation

## 2018-03-12 DIAGNOSIS — Z794 Long term (current) use of insulin: Secondary | ICD-10-CM

## 2018-03-12 DIAGNOSIS — I5020 Unspecified systolic (congestive) heart failure: Secondary | ICD-10-CM

## 2018-03-12 DIAGNOSIS — Z23 Encounter for immunization: Secondary | ICD-10-CM

## 2018-03-12 DIAGNOSIS — E1165 Type 2 diabetes mellitus with hyperglycemia: Secondary | ICD-10-CM

## 2018-03-12 DIAGNOSIS — Z0000 Encounter for general adult medical examination without abnormal findings: Secondary | ICD-10-CM

## 2018-03-12 DIAGNOSIS — Z95810 Presence of automatic (implantable) cardiac defibrillator: Secondary | ICD-10-CM

## 2018-03-12 DIAGNOSIS — I110 Hypertensive heart disease with heart failure: Secondary | ICD-10-CM | POA: Diagnosis not present

## 2018-03-12 DIAGNOSIS — Z79899 Other long term (current) drug therapy: Secondary | ICD-10-CM

## 2018-03-12 DIAGNOSIS — K5900 Constipation, unspecified: Secondary | ICD-10-CM

## 2018-03-12 DIAGNOSIS — E785 Hyperlipidemia, unspecified: Secondary | ICD-10-CM

## 2018-03-12 LAB — POCT GLYCOSYLATED HEMOGLOBIN (HGB A1C): Hemoglobin A1C: 6.8 % — AB (ref 4.0–5.6)

## 2018-03-12 LAB — GLUCOSE, CAPILLARY: GLUCOSE-CAPILLARY: 155 mg/dL — AB (ref 70–99)

## 2018-03-12 MED ORDER — FLUTICASONE PROPIONATE 50 MCG/ACT NA SUSP: 1.0000 | Freq: Two times a day (BID) | NASAL | 2 refills | Status: DC

## 2018-03-12 MED ORDER — GABAPENTIN 300 MG PO CAPS: 300.0000 mg | ORAL_CAPSULE | Freq: Every day | ORAL | 3 refills | Status: DC

## 2018-03-12 NOTE — Progress Notes (Signed)
   CC: diabetes f/u  HPI: [name] is a 52 yo female with a past medical history of DMII, HTN, systolic heart failure s/p AICD placement who presents for follow up of her diabetes. Hypertension, hyperlipidemia, constipation, and bilateral leg pain were also discussed today. Please see problem based charting for the status of the patient's current and chronic medical conditions.   Past Medical History:  Diagnosis Date  . AICD (automatic cardioverter/defibrillator) present 12/13/2016  . Anxiety    no meds  . Asthma   . CHF (congestive heart failure) ([place])   . Depression    no meds  . Diabetes mellitus    recent dx 11/17/11 - started med 11/18/11 type 2  . GERD (gastroesophageal reflux disease)   . Goiter 07/27/2011   non-neoplastic goiter - fine needle aspiration - benign sees dr Dwyane Dee for  . Heart murmur    dx 2 yrs ago per pt  . Herpes genitalis in women   . Hypertension   . IBS (irritable bowel syndrome)    tx with diet per pt  . Low back pain    history  . Obesity   . Seasonal allergies   . Shortness of breath    occasional - exercise induced  . Sleep apnea    cpap broken  . Systolic heart failure    May 2011 EF 35-40%, 04/03/11 EF 25-30%, 06/27/11 EF 30-35%    Review of Systems:   Pertinent positives mentioned in HPI. Remainder of all ROS    Physical Exam: Vitals:   03/12/18 1403 03/12/18 1535  BP: 133/72 127/74  Pulse: 72 68  Temp: 97.8 F (36.6 C)   TempSrc: Oral   SpO2: 100%   Weight: 187 lb (84.8 kg)   Height: 5\' 4"  (1.626 m)     Physical Exam  Constitutional: Well-developed, well-nourished, and in no distress.  Eyes: Pupils are equal, round, and reactive to light. EOM are normal.  Cardiovascular: Normal rate and regular rhythm. No murmurs, rubs, or gallops. Pulmonary/Chest: Effort normal. Clear to auscultation bilaterally. No wheezes, rales, or rhonchi. Abdominal: Bowel sounds present. Soft, non-distended, non-tender. Ext: No lower extremity  edema. Distal pulses intact. Skin: Warm and dry. No rashes or wounds.   Assessment & Plan:   See Encounters Tab for problem based charting.  Patient seen with Dr. Lynnae January

## 2018-03-12 NOTE — Telephone Encounter (Signed)
Remote ICM transmission received.  Attempted call to patient and left detailed message, per DPR, regarding transmission and next ICM scheduled for 03/18/2018.  Advised to return call for any fluid symptoms or questions.

## 2018-03-12 NOTE — Assessment & Plan Note (Signed)
A: Susan Peck reports that she has not been on a statin in years because "a doctor took her off of it." She doesn't remember which doctor or why the medication was stopped. According to a cardiology note from March of 2019, she should be on a statin. Susan Peck doesn't remember having any side effects from her lipitor.  Plan 1. Restart Lipitor 40 mg daily

## 2018-03-12 NOTE — Assessment & Plan Note (Signed)
BP today 127/74. Currently on Coreg 25 mg, eplerenone 25 mg, sacubtril-valsartan 97-103 mg, and lasix 40 mg daily. Well-controlled on current regimen.  Plan 1. Continue current regimen as above.

## 2018-03-12 NOTE — Assessment & Plan Note (Signed)
HPI: Susan Peck reports that she had had constipation for months. She has a BM once every five days. She reports abdominal distension, abdominal pain, and back pain with the constipation. She has had constipation for years, but in the past few months it has become more severe and associated with pain. She went to the emergency room for this problem recently and had an abdominal CT that was normal. She occasionally has nausea, but no vomiting. She has not tried any medications for this problem. She reports that she eats vegetables in order to get fiber.   Assessment: DDx includes autonomic dysfunction secondary to diabetes, hypercalcemia, and hypothyroidism. Ca was normal on recent CMP from July.   Plan 1. TSH today 2. Start Miralax daily 3. Add fiber supplementation to diet

## 2018-03-12 NOTE — Assessment & Plan Note (Signed)
HPI: Ms. Gage reports that for months she has had bilateral leg pain that radiates from her thighs down to her toes. She describes the pain as a burning ache. The pain sometimes comes after she is waking around, but it is the worst at night when she's laying in bed. Moving her legs does not alleviate the pain. Dangling her feet over the edge of her bed does not alleviate the pain. When she is walking, leaning over onto something does not alleviate the pain. She reports normal sensation in her feet. She denies low back pain except for the pain associated with her constipation. She has not tried any medications for this problem.   Assessment: Her symptoms may represent vascular or neurogenic claudication. Distal pulses were intact. ABIs were performed in clinic today and were normal. Abdomen/pelvis CT from 7/31 showed mild lower lumbar spine spondylosis. This finding may be indicative of lumbar spinal stenosis. Due to her ICD, she cannot undergo MRI imaging of the lumbar spine for further evaluation at this time. Also on the differential is diabetic neuropathy and restless leg syndrome.   Plan 1. TSH and B12 today. 2. Start Gabapentin 300 mg qhs for burning pain. 3. Reassess symptoms in 3 months. If symptoms have progressed, consider pursuing lumbar spine imaging or switching therapy to anti-inflammatories including NSAIDs and steroid injections.

## 2018-03-12 NOTE — Progress Notes (Signed)
EPIC Encounter for ICM Monitoring  Patient Name: Susan Peck is a 52 y.o. female Date: 03/12/2018 Primary Care Physican: Annie Paras Andree Elk, MD Primary Cardiologist: [place] Electrophysiologist: Faustino Congress Weight: Previous weight 187 lbs (184-195 lbs) Bi-V Pacing:   99.9%                Attempted call to patient and unable to reach.  Left detailed message, per DPR, regarding transmission.  Transmission reviewed.    Thoracic impedance returned to normal after recommending to take extra Furosemide x 3 days but slight re accumulation starting.   Prescribed dosage: Furosemide 40 mg take 2 tablets (80 mg total) every morning and 1 tablet (40 mg total) every evening.  Labs: 02/20/2018 Creatinine 0.59, BUN 19, Potassium 3.7, Sodium 142 09/25/2017 Creatinine 1.07, BUN 20, Potassium 4.2, Sodium 136, EGFR 69.27  Recommendations: Left voice mail with ICM number and encouraged to call if experiencing any fluid symptoms.  Follow-up plan: ICM clinic phone appointment on 03/18/2018 to recheck fluid levels.     Copy of ICM check sent to Dr. Caryl Comes and Dr Haroldine Laws.   3 month ICM trend: 03/11/2018    1 Year ICM trend:       [name], RN 03/12/2018 8:40 AM

## 2018-03-12 NOTE — Assessment & Plan Note (Signed)
Patient was offered flu and pneumonia vaccines today. She declined.

## 2018-03-12 NOTE — Patient Instructions (Addendum)
For your diabetes -Continue the great work! -Continue taking your Xultophy 12 units daily  For your constipation -Today we will get blood work (B12 and TSH) -Start taking 1 capful of Miralax every day. If you still don't have a bowel movement, take 2 caps every day.  -Start a fiber supplement of your choice  For your leg pain -Begin taking gabapentin 300 mg at night. If you aren't having any relief of the pain, you can increase this dose to 300 mg twice per day.  Also, restart your lipitor 40 mg daily.  Return to clinic for follow up of your diabetes and high blood pressure in 3 months.

## 2018-03-12 NOTE — Assessment & Plan Note (Signed)
A: Susan Peck has made excellent progress in regards to her diabetes in the past 3 months. Her A1c is 6.8 today down from 12 three months ago. Since the last A1c reading, she did CGM and diabetes education. She has made changes to her diet and exercise regimen. She checks her sugar three times a day before each meal. She is currently on Xultophy 12 units daily. She denies any episodes of hypoglycemia. She had an eye exam last month.   P: Continue Xultophy 12 units daily.

## 2018-03-13 LAB — VITAMIN B12: Vitamin B-12: 539 pg/mL (ref 232–1245)

## 2018-03-13 LAB — TSH: TSH: 0.718 u[IU]/mL (ref 0.450–4.500)

## 2018-03-13 NOTE — Telephone Encounter (Signed)
Dr Annie Paras filled on 8/16. I just resent it. Needs PCP appt end of Nov or in Dec DM F/U

## 2018-03-15 NOTE — Progress Notes (Signed)
Internal Medicine Clinic Attending  I saw and evaluated the patient.  I personally confirmed the key portions of the history and exam documented by Dr. Annie Paras and I reviewed pertinent patient test results.  The assessment, diagnosis, and plan were formulated together and I agree with the documentation in the resident's note. Her pain doesn't fit neatly into any dz pattern. Therefore, the wide net for diff dx. Gaba trial bc there is some neuropathic component.

## 2018-03-18 ENCOUNTER — Telehealth: Payer: Self-pay

## 2018-03-18 ENCOUNTER — Ambulatory Visit (INDEPENDENT_AMBULATORY_CARE_PROVIDER_SITE_OTHER): Payer: Medicare Other

## 2018-03-18 DIAGNOSIS — I5022 Chronic systolic (congestive) heart failure: Secondary | ICD-10-CM

## 2018-03-18 DIAGNOSIS — Z95810 Presence of automatic (implantable) cardiac defibrillator: Secondary | ICD-10-CM

## 2018-03-18 NOTE — Telephone Encounter (Signed)
Remote ICM transmission received.  Attempted call to patient and left detailed message, per DPR, regarding transmission and next ICM scheduled for 04/15/2018.  Advised to return call for any fluid symptoms or questions.

## 2018-03-18 NOTE — Progress Notes (Signed)
EPIC Encounter for ICM Monitoring  Patient Name: Susan Peck is a 52 y.o. female Date: 03/18/2018 Primary Care Physican: Dorrell, Deborah N, MD Primary Cardiologist:B[place] Electrophysiologist:Klein Dry Weight:Previous weight 187lbs (184-195 lbs) Bi-V Pacing:99.9%      Attempted call to patient and unable to reach.  Left detailed message, per DPR, regarding transmission.  Transmission reviewed.    Thoracic impedance returned to normal since last ICM remote transmission on 03/11/2018.   Prescribed dosage: Furosemide40 mg take 2 tablets (80 mg total) every morning and 1 tablet (40 mg total) every evening.  Labs: 02/20/2018 Creatinine 0.59, BUN 19, Potassium 3.7, Sodium 142 09/25/2017 Creatinine 1.07, BUN 20, Potassium 4.2, Sodium 136, EGFR 69.27  Recommendations:Left voice mail with ICM number and encouraged to call if experiencing any fluid symptoms.  Follow-up plan: ICM clinic phone appointment on 04/15/2018.       Copy of ICM check sent to Dr. Klein.   3 month ICM trend: 03/18/2018    1 Year ICM trend:       Laurie S Short, RN 03/18/2018 12:44 PM   

## 2018-03-19 ENCOUNTER — Encounter: Payer: Self-pay | Admitting: Internal Medicine

## 2018-03-27 ENCOUNTER — Ambulatory Visit: Payer: Medicare Other | Admitting: Adult Health

## 2018-03-27 ENCOUNTER — Ambulatory Visit (INDEPENDENT_AMBULATORY_CARE_PROVIDER_SITE_OTHER): Payer: Medicare Other | Admitting: *Deleted

## 2018-03-27 DIAGNOSIS — I5022 Chronic systolic (congestive) heart failure: Secondary | ICD-10-CM | POA: Diagnosis not present

## 2018-03-27 DIAGNOSIS — I428 Other cardiomyopathies: Secondary | ICD-10-CM

## 2018-03-27 NOTE — Progress Notes (Signed)
Remote ICD transmission.   

## 2018-04-02 ENCOUNTER — Other Ambulatory Visit (HOSPITAL_COMMUNITY): Payer: Self-pay | Admitting: Cardiology

## 2018-04-09 ENCOUNTER — Encounter: Payer: Self-pay | Admitting: Internal Medicine

## 2018-04-09 ENCOUNTER — Telehealth (HOSPITAL_COMMUNITY): Payer: Self-pay | Admitting: *Deleted

## 2018-04-09 NOTE — Telephone Encounter (Signed)
Pt called requesting a call from Susan Peck wanting to know what her restrictions and limitations were based on her heart failure. Patient is trying to get student loan dissolved. After reviewing patients chart and last echo patient does not have any limitations or restrictions. Per Susan Peck. Patients last disability paperwork filed by her pcp also states no limitations on restrictions.Called patient to explain but no answer.

## 2018-04-12 NOTE — Telephone Encounter (Signed)
Patient brought in her paperwork in to be completed.  Will forward papers to Dr. Annie Paras to review if an appointment is needed.

## 2018-04-15 ENCOUNTER — Ambulatory Visit (INDEPENDENT_AMBULATORY_CARE_PROVIDER_SITE_OTHER): Payer: Medicare Other

## 2018-04-15 DIAGNOSIS — Z95810 Presence of automatic (implantable) cardiac defibrillator: Secondary | ICD-10-CM | POA: Diagnosis not present

## 2018-04-15 DIAGNOSIS — I5022 Chronic systolic (congestive) heart failure: Secondary | ICD-10-CM

## 2018-04-15 NOTE — Progress Notes (Signed)
EPIC Encounter for ICM Monitoring  Patient Name: Susan Peck is a 52 y.o. female Date: 04/15/2018 Primary Care Physican: Corinne Ports, MD Primary Cardiologist:Bensimhon Electrophysiologist:Klein Dry Weight:Previous weight187lbs (184-195 lbs) Bi-V Pacing:99.9%      Attempted call to patient and unable to reach.  Left detailed message, per DPR, regarding transmission.  Transmission reviewed.    Thoracic impedance normal.  Prescribed: Furosemide40 mg take 2 tablets (80 mg total) every morning and 1 tablet (40 mg total) every evening.  Labs: 02/20/2018 Creatinine 0.59, BUN 19, Potassium 3.7, Sodium 142 09/25/2017 Creatinine 1.07, BUN 20, Potassium 4.2, Sodium 136, EGFR 69.27  Recommendations: Left voice mail with ICM number and encouraged to call if experiencing any fluid symptoms.  Follow-up plan: ICM clinic phone appointment on 05/16/2018.    Copy of ICM check sent to Dr. Caryl Comes.   3 month ICM trend: 04/15/2018    1 Year ICM trend:       [name], RN 04/15/2018 2:25 PM

## 2018-04-16 ENCOUNTER — Telehealth: Payer: Self-pay

## 2018-04-16 NOTE — Telephone Encounter (Signed)
Remote ICM transmission received.  Attempted call to patient and left detailed message, per DPR, regarding transmission and next ICM scheduled for 05/16/2018.  Advised to return call for any fluid symptoms or questions.    

## 2018-04-18 LAB — CUP PACEART REMOTE DEVICE CHECK
Battery Voltage: 2.99 V
Brady Statistic AP VP Percent: 0.55 %
Brady Statistic AS VP Percent: 99.4 %
Brady Statistic AS VS Percent: 0.03 %
Brady Statistic RV Percent Paced: 99.94 %
Date Time Interrogation Session: 20190904101606
HIGH POWER IMPEDANCE MEASURED VALUE: 65 Ohm
Implantable Lead Implant Date: 20180524
Implantable Lead Implant Date: 20180524
Implantable Lead Location: 753858
Implantable Lead Location: 753859
Implantable Lead Location: 753860
Implantable Lead Model: 5076
Implantable Pulse Generator Implant Date: 20180524
Lead Channel Impedance Value: 152 Ohm
Lead Channel Impedance Value: 156.606
Lead Channel Impedance Value: 165.029
Lead Channel Impedance Value: 165.029
Lead Channel Impedance Value: 304 Ohm
Lead Channel Impedance Value: 323 Ohm
Lead Channel Impedance Value: 399 Ohm
Lead Channel Impedance Value: 475 Ohm
Lead Channel Impedance Value: 532 Ohm
Lead Channel Impedance Value: 532 Ohm
Lead Channel Impedance Value: 570 Ohm
Lead Channel Pacing Threshold Amplitude: 0.625 V
Lead Channel Pacing Threshold Pulse Width: 0.4 ms
Lead Channel Sensing Intrinsic Amplitude: 2.5 mV
Lead Channel Sensing Intrinsic Amplitude: 31.125 mV
Lead Channel Setting Pacing Amplitude: 1.25 V
Lead Channel Setting Pacing Amplitude: 2.5 V
MDC IDC LEAD IMPLANT DT: 20180524
MDC IDC MSMT BATTERY REMAINING LONGEVITY: 87 mo
MDC IDC MSMT LEADCHNL LV IMPEDANCE VALUE: 170.472
MDC IDC MSMT LEADCHNL LV IMPEDANCE VALUE: 304 Ohm
MDC IDC MSMT LEADCHNL LV IMPEDANCE VALUE: 361 Ohm
MDC IDC MSMT LEADCHNL LV IMPEDANCE VALUE: 570 Ohm
MDC IDC MSMT LEADCHNL LV IMPEDANCE VALUE: 608 Ohm
MDC IDC MSMT LEADCHNL LV PACING THRESHOLD PULSEWIDTH: 0.4 ms
MDC IDC MSMT LEADCHNL RA IMPEDANCE VALUE: 361 Ohm
MDC IDC MSMT LEADCHNL RA PACING THRESHOLD AMPLITUDE: 0.625 V
MDC IDC MSMT LEADCHNL RA PACING THRESHOLD PULSEWIDTH: 0.4 ms
MDC IDC MSMT LEADCHNL RA SENSING INTR AMPL: 2.5 mV
MDC IDC MSMT LEADCHNL RV IMPEDANCE VALUE: 627 Ohm
MDC IDC MSMT LEADCHNL RV PACING THRESHOLD AMPLITUDE: 0.375 V
MDC IDC MSMT LEADCHNL RV SENSING INTR AMPL: 31.125 mV
MDC IDC SET LEADCHNL LV PACING PULSEWIDTH: 0.4 ms
MDC IDC SET LEADCHNL RA PACING AMPLITUDE: 1.5 V
MDC IDC SET LEADCHNL RV PACING PULSEWIDTH: 0.4 ms
MDC IDC SET LEADCHNL RV SENSING SENSITIVITY: 0.3 mV
MDC IDC STAT BRADY AP VS PERCENT: 0.01 %
MDC IDC STAT BRADY RA PERCENT PACED: 0.56 %

## 2018-04-22 ENCOUNTER — Telehealth: Payer: Self-pay

## 2018-04-22 NOTE — Telephone Encounter (Signed)
Remote transmission reviewed. Presenting rhythm: AsBp. No episodes recorded. Stable lead measurements. Normal device function.  Called patient regarding the pain at her ICD site. Patient states that she's noticed an occasional dull ache at the site x several weeks. Patient states that she does not recall hitting the site, but states that she does favor her L side while sleeping. Patient denies any fever or chills. Patient denies any redness, swelling, or ecchymosis at her device site. I told patient that I could schedule her an appt with the device clinic this week for further assessment or she could try sleeping on her R side and see if her symptoms improve. Patient verbalized understanding and plans to try sleeping on her R side. Instructed patient to call back if sx's worsen or persist. Again, patient verbalized understanding.

## 2018-04-22 NOTE — Telephone Encounter (Signed)
The pt is having pain where her wound site is. She

## 2018-04-24 ENCOUNTER — Encounter: Payer: Self-pay | Admitting: Women's Health

## 2018-04-24 ENCOUNTER — Ambulatory Visit (INDEPENDENT_AMBULATORY_CARE_PROVIDER_SITE_OTHER): Payer: Medicare Other | Admitting: Women's Health

## 2018-04-24 VITALS — BP 120/82 | Ht 64.0 in | Wt 186.0 lb

## 2018-04-24 DIAGNOSIS — Z01419 Encounter for gynecological examination (general) (routine) without abnormal findings: Secondary | ICD-10-CM | POA: Diagnosis not present

## 2018-04-24 NOTE — Patient Instructions (Signed)
Health Maintenance for Postmenopausal Women Menopause is a normal process in which your reproductive ability comes Carbohydrate Counting for Diabetes Mellitus, Adult Carbohydrate counting is a method for keeping track of how many carbohydrates you eat. Eating carbohydrates naturally increases the amount of sugar (glucose) in the blood. Counting how many carbohydrates you eat helps keep your blood glucose within normal limits, which helps you manage your diabetes (diabetes mellitus). It is important to know how many carbohydrates you can safely have in each meal. This is different for every person. A diet and nutrition specialist (registered dietitian) can help you make a meal plan and calculate how many carbohydrates you should have at each meal and snack. Carbohydrates are found in the following foods:  Grains, such as breads and cereals.  Dried beans and soy products.  Starchy vegetables, such as potatoes, peas, and corn.  Fruit and fruit juices.  Milk and yogurt.  Sweets and snack foods, such as cake, cookies, candy, chips, and soft drinks.  How do I count carbohydrates? There are two ways to count carbohydrates in food. You can use either of the methods or a combination of both. Reading "Nutrition Facts" on packaged food The "Nutrition Facts" list is included on the labels of almost all packaged foods and beverages in the [place] It includes:  The serving size.  Information about nutrients in each serving, including the grams (g) of carbohydrate per serving.  To use the "Nutrition Facts":  Decide how many servings you will have.  Multiply the number of servings by the number of carbohydrates per serving.  The resulting number is the total amount of carbohydrates that you will be having.  Learning standard serving sizes of other foods When you eat foods containing carbohydrates that are not packaged or do not include "Nutrition Facts" on the label, you need to measure the  servings in order to count the amount of carbohydrates:  Measure the foods that you will eat with a food scale or measuring cup, if needed.  Decide how many standard-size servings you will eat.  Multiply the number of servings by 15. Most carbohydrate-rich foods have about 15 g of carbohydrates per serving. ? For example, if you eat 8 oz (170 g) of strawberries, you will have eaten 2 servings and 30 g of carbohydrates (2 servings x 15 g = 30 g).  For foods that have more than one food mixed, such as soups and casseroles, you must count the carbohydrates in each food that is included.  The following list contains standard serving sizes of common carbohydrate-rich foods. Each of these servings has about 15 g of carbohydrates:   hamburger bun or  English muffin.   oz (15 mL) syrup.   oz (14 g) jelly.  1 slice of bread.  1 six-inch tortilla.  3 oz (85 g) cooked rice or pasta.  4 oz (113 g) cooked dried beans.  4 oz (113 g) starchy vegetable, such as peas, corn, or potatoes.  4 oz (113 g) hot cereal.  4 oz (113 g) mashed potatoes or  of a large baked potato.  4 oz (113 g) canned or frozen fruit.  4 oz (120 mL) fruit juice.  4-6 crackers.  6 chicken nuggets.  6 oz (170 g) unsweetened dry cereal.  6 oz (170 g) plain fat-free yogurt or yogurt sweetened with artificial sweeteners.  8 oz (240 mL) milk.  8 oz (170 g) fresh fruit or one small piece of fruit.  24 oz (680 g)  popped popcorn.  Example of carbohydrate counting Sample meal  3 oz (85 g) chicken breast.  6 oz (170 g) brown rice.  4 oz (113 g) corn.  8 oz (240 mL) milk.  8 oz (170 g) strawberries with sugar-free whipped topping. Carbohydrate calculation 1. Identify the foods that contain carbohydrates: ? Rice. ? Corn. ? Milk. ? Strawberries. 2. Calculate how many servings you have of each food: ? 2 servings rice. ? 1 serving corn. ? 1 serving milk. ? 1 serving strawberries. 3. Multiply each  number of servings by 15 g: ? 2 servings rice x 15 g = 30 g. ? 1 serving corn x 15 g = 15 g. ? 1 serving milk x 15 g = 15 g. ? 1 serving strawberries x 15 g = 15 g. 4. Add together all of the amounts to find the total grams of carbohydrates eaten: ? 30 g + 15 g + 15 g + 15 g = 75 g of carbohydrates total. This information is not intended to replace advice given to you by your health care provider. Make sure you discuss any questions you have with your health care provider. Document Released: 07/10/2005 Document Revised: 01/28/2016 Document Reviewed: 12/22/2015 Elsevier Interactive Patient Education  [employer].  to an end. This process happens gradually over a span of months to years, usually between the ages of 25 and 30. Menopause is complete when you have missed 12 consecutive menstrual periods. It is important to talk with your health care provider about some of the most common conditions that affect postmenopausal women, such as heart disease, cancer, and bone loss (osteoporosis). Adopting a healthy lifestyle and getting preventive care can help to promote your health and wellness. Those actions can also lower your chances of developing some of these common conditions. What should I know about menopause? During menopause, you may experience a number of symptoms, such as:  Moderate-to-severe hot flashes.  Night sweats.  Decrease in sex drive.  Mood swings.  Headaches.  Tiredness.  Irritability.  Memory problems.  Insomnia.  Choosing to treat or not to treat menopausal changes is an individual decision that you make with your health care provider. What should I know about hormone replacement therapy and supplements? Hormone therapy products are effective for treating symptoms that are associated with menopause, such as hot flashes and night sweats. Hormone replacement carries certain risks, especially as you become older. If you are thinking about using estrogen or  estrogen with progestin treatments, discuss the benefits and risks with your health care provider. What should I know about heart disease and stroke? Heart disease, heart attack, and stroke become more likely as you age. This may be due, in part, to the hormonal changes that your body experiences during menopause. These can affect how your body processes dietary fats, triglycerides, and cholesterol. Heart attack and stroke are both medical emergencies. There are many things that you can do to help prevent heart disease and stroke:  Have your blood pressure checked at least every 1-2 years. High blood pressure causes heart disease and increases the risk of stroke.  If you are 30-27 years old, ask your health care provider if you should take aspirin to prevent a heart attack or a stroke.  Do not use any tobacco products, including cigarettes, chewing tobacco, or electronic cigarettes. If you need help quitting, ask your health care provider.  It is important to eat a healthy diet and maintain a healthy weight. ? Be sure  to include plenty of vegetables, fruits, low-fat dairy products, and lean protein. ? Avoid eating foods that are high in solid fats, added sugars, or salt (sodium).  Get regular exercise. This is one of the most important things that you can do for your health. ? Try to exercise for at least 150 minutes each week. The type of exercise that you do should increase your heart rate and make you sweat. This is known as moderate-intensity exercise. ? Try to do strengthening exercises at least twice each week. Do these in addition to the moderate-intensity exercise.  Know your numbers.Ask your health care provider to check your cholesterol and your blood glucose. Continue to have your blood tested as directed by your health care provider.  What should I know about cancer screening? There are several types of cancer. Take the following steps to reduce your risk and to catch any cancer  development as early as possible. Breast Cancer  Practice breast self-awareness. ? This means understanding how your breasts normally appear and feel. ? It also means doing regular breast self-exams. Let your health care provider know about any changes, no matter how small.  If you are 76 or older, have a clinician do a breast exam (clinical breast exam or CBE) every year. Depending on your age, family history, and medical history, it may be recommended that you also have a yearly breast X-ray (mammogram).  If you have a family history of breast cancer, talk with your health care provider about genetic screening.  If you are at high risk for breast cancer, talk with your health care provider about having an MRI and a mammogram every year.  Breast cancer (BRCA) gene test is recommended for women who have family members with BRCA-related cancers. Results of the assessment will determine the need for genetic counseling and BRCA1 and for BRCA2 testing. BRCA-related cancers include these types: ? Breast. This occurs in males or females. ? Ovarian. ? Tubal. This may also be called fallopian tube cancer. ? Cancer of the abdominal or pelvic lining (peritoneal cancer). ? Prostate. ? Pancreatic.  Cervical, Uterine, and Ovarian Cancer Your health care provider may recommend that you be screened regularly for cancer of the pelvic organs. These include your ovaries, uterus, and vagina. This screening involves a pelvic exam, which includes checking for microscopic changes to the surface of your cervix (Pap test).  For women ages 21-65, health care providers may recommend a pelvic exam and a Pap test every three years. For women ages 31-65, they may recommend the Pap test and pelvic exam, combined with testing for human papilloma virus (HPV), every five years. Some types of HPV increase your risk of cervical cancer. Testing for HPV may also be done on women of any age who have unclear Pap test  results.  Other health care providers may not recommend any screening for nonpregnant women who are considered low risk for pelvic cancer and have no symptoms. Ask your health care provider if a screening pelvic exam is right for you.  If you have had past treatment for cervical cancer or a condition that could lead to cancer, you need Pap tests and screening for cancer for at least 20 years after your treatment. If Pap tests have been discontinued for you, your risk factors (such as having a new sexual partner) need to be reassessed to determine if you should start having screenings again. Some women have medical problems that increase the chance of getting cervical cancer. In these  cases, your health care provider may recommend that you have screening and Pap tests more often.  If you have a family history of uterine cancer or ovarian cancer, talk with your health care provider about genetic screening.  If you have vaginal bleeding after reaching menopause, tell your health care provider.  There are currently no reliable tests available to screen for ovarian cancer.  Lung Cancer Lung cancer screening is recommended for adults 10-13 years old who are at high risk for lung cancer because of a history of smoking. A yearly low-dose CT scan of the lungs is recommended if you:  Currently smoke.  Have a history of at least 30 pack-years of smoking and you currently smoke or have quit within the past 15 years. A pack-year is smoking an average of one pack of cigarettes per day for one year.  Yearly screening should:  Continue until it has been 15 years since you quit.  Stop if you develop a health problem that would prevent you from having lung cancer treatment.  Colorectal Cancer  This type of cancer can be detected and can often be prevented.  Routine colorectal cancer screening usually begins at age 62 and continues through age 31.  If you have risk factors for colon cancer, your health  care provider may recommend that you be screened at an earlier age.  If you have a family history of colorectal cancer, talk with your health care provider about genetic screening.  Your health care provider may also recommend using home test kits to check for hidden blood in your stool.  A small camera at the end of a tube can be used to examine your colon directly (sigmoidoscopy or colonoscopy). This is done to check for the earliest forms of colorectal cancer.  Direct examination of the colon should be repeated every 5-10 years until age 36. However, if early forms of precancerous polyps or small growths are found or if you have a family history or genetic risk for colorectal cancer, you may need to be screened more often.  Skin Cancer  Check your skin from head to toe regularly.  Monitor any moles. Be sure to tell your health care provider: ? About any new moles or changes in moles, especially if there is a change in a mole's shape or color. ? If you have a mole that is larger than the size of a pencil eraser.  If any of your family members has a history of skin cancer, especially at a young age, talk with your health care provider about genetic screening.  Always use sunscreen. Apply sunscreen liberally and repeatedly throughout the day.  Whenever you are outside, protect yourself by wearing long sleeves, pants, a wide-brimmed hat, and sunglasses.  What should I know about osteoporosis? Osteoporosis is a condition in which bone destruction happens more quickly than new bone creation. After menopause, you may be at an increased risk for osteoporosis. To help prevent osteoporosis or the bone fractures that can happen because of osteoporosis, the following is recommended:  If you are 33-33 years old, get at least 1,000 mg of calcium and at least 600 mg of vitamin D per day.  If you are older than age 13 but younger than age 27, get at least 1,200 mg of calcium and at least 600 mg of  vitamin D per day.  If you are older than age 11, get at least 1,200 mg of calcium and at least 800 mg of vitamin D per  day.  Smoking and excessive alcohol intake increase the risk of osteoporosis. Eat foods that are rich in calcium and vitamin D, and do weight-bearing exercises several times each week as directed by your health care provider. What should I know about how menopause affects my mental health? Depression may occur at any age, but it is more common as you become older. Common symptoms of depression include:  Low or sad mood.  Changes in sleep patterns.  Changes in appetite or eating patterns.  Feeling an overall lack of motivation or enjoyment of activities that you previously enjoyed.  Frequent crying spells.  Talk with your health care provider if you think that you are experiencing depression. What should I know about immunizations? It is important that you get and maintain your immunizations. These include:  Tetanus, diphtheria, and pertussis (Tdap) booster vaccine.  Influenza every year before the flu season begins.  Pneumonia vaccine.  Shingles vaccine.  Your health care provider may also recommend other immunizations. This information is not intended to replace advice given to you by your health care provider. Make sure you discuss any questions you have with your health care provider. Document Released: 09/01/2005 Document Revised: 01/28/2016 Document Reviewed: 04/13/2015 Elsevier Interactive Patient Education  2018 [employer].

## 2018-04-24 NOTE — Progress Notes (Signed)
Susan Peck 07-13-1966 [id number]    History:    Presents for breast and pelvic exam.  TVH for fibroids on no HRT.  2017 normal DEXA.  Normal Pap and mammogram history overdue for mammogram.  Primary care manages hypertension, diabetes, COPD, hypercholesteremia and CHF.  2018 has an implantable defibrillator that does cause some discomfort.  Is on disability.  Not sexually active, denies need for STD screen.  Past medical history, past surgical history, family history and social history were all reviewed and documented in the EPIC chart. Has a strained relationship with her daughter.  Has 2 sons that are doing well.  ROS:  A ROS was performed and pertinent positives and negatives are included.  Exam:  Vitals:   04/24/18 1011  BP: 120/82  Weight: 186 lb (84.4 kg)  Height: 5\' 4"  (1.626 m)   Body mass index is 31.93 kg/m.   General appearance:  Normal Thyroid:  Symmetrical, normal in size, without palpable masses or nodularity. Respiratory  Auscultation:  Clear without wheezing or rhonchi Cardiovascular  Auscultation:  Regular rate, without rubs, murmurs or gallops  Edema/varicosities:  Not grossly evident Abdominal  Soft,nontender, without masses, guarding or rebound.  Liver/spleen:  No organomegaly noted  Hernia:  None appreciated  Skin  Inspection:  Grossly normal   Breasts: Examined lying and sitting.     Right: Without masses, retractions, discharge or axillary adenopathy.     Left: Without masses, retractions, discharge or axillary adenopathy. Gentitourinary   Inguinal/mons:  Normal without inguinal adenopathy  External genitalia:  Normal  BUS/Urethra/Skene's glands:  Normal  Vagina:  Normal  Cervix: And uterus absent adnexa/parametria:     Rt: Without masses or tenderness.   Lt: Without masses or tenderness.  Anus and perineum: Normal  Digital rectal exam: Normal sphincter tone without palpated masses or tenderness  Assessment/Plan:  52 y.o. SB F G7 P3 for  breast and pelvic exam with no GYN complaints.  TVH for fibroids on no HRT Obesity Hypertension, COPD, CHF, hypercholesteremia, diabetes-primary care manages labs and meds   Plan: SBE's, reviewed importance of annual screening mammogram, especially mother history of breast cancer at age 59 deceased.  Increase regular exercise, decrease calorie/carbs, encouraged walking most days of the week.  Vitamin D 2000 daily encouraged.  Home safety, fall prevention discussed.  Reviewed Shingrex and Pneumovax will discuss with primary care.    [facility]uel [facility], 10:44 AM 04/24/2018

## 2018-04-25 NOTE — Telephone Encounter (Signed)
Called patient to inform her that the message means that Dr.Klein resulted on the remote on that date and time. Patient verbalized understanding.

## 2018-05-10 ENCOUNTER — Telehealth: Payer: Self-pay | Admitting: Dietician

## 2018-05-10 NOTE — Telephone Encounter (Signed)
Answered her questions about pen needles.

## 2018-05-16 ENCOUNTER — Ambulatory Visit (INDEPENDENT_AMBULATORY_CARE_PROVIDER_SITE_OTHER): Payer: Medicare Other

## 2018-05-16 DIAGNOSIS — Z95810 Presence of automatic (implantable) cardiac defibrillator: Secondary | ICD-10-CM | POA: Diagnosis not present

## 2018-05-16 DIAGNOSIS — I5022 Chronic systolic (congestive) heart failure: Secondary | ICD-10-CM | POA: Diagnosis not present

## 2018-05-17 ENCOUNTER — Telehealth: Payer: Self-pay

## 2018-05-17 NOTE — Progress Notes (Signed)
EPIC Encounter for ICM Monitoring  Patient Name: Susan Peck is a 52 y.o. female Date: 05/17/2018 Primary Care Physican: Corinne Ports, MD Primary Cardiologist:Bensimhon Electrophysiologist:Klein Dry Weight: Previous weight187lbs (184-195 lbs) Bi-V Pacing:99.9%       Attempted call to patient and unable to reach.  Left detailed message, per DPR, regarding transmission.  Transmission reviewed.    Thoracic impedance normal.   Prescribed: Furosemide40 mg take 2 tablets (80 mg total) every morning and 1 tablet (40 mg total) every evening.  Labs: 02/20/2018 Creatinine 0.59, BUN 19, Potassium 3.7, Sodium 142 09/25/2017 Creatinine 1.07, BUN 20, Potassium 4.2, Sodium 136, EGFR 69.27  Recommendations: Left voice mail with ICM number and encouraged to call if experiencing any fluid symptoms.  Follow-up plan: ICM clinic phone appointment on 06/27/2018.    Copy of ICM check sent to Dr. Caryl Comes.   3 month ICM trend: 05/16/2018    1 Year ICM trend:       [name], RN 05/17/2018 11:12 AM

## 2018-05-17 NOTE — Telephone Encounter (Signed)
Remote ICM transmission received.  Attempted call to patient regarding ICM remote transmission and left detailed message, per DPR, with next ICM remote transmission date of 06/27/2018.  Advised to return call for any fluid symptoms or questions.

## 2018-06-04 ENCOUNTER — Other Ambulatory Visit: Payer: Self-pay | Admitting: Internal Medicine

## 2018-06-13 ENCOUNTER — Telehealth: Payer: Self-pay | Admitting: Dietician

## 2018-06-13 NOTE — Telephone Encounter (Signed)
Pharmacy sent [place] paper work for her Xultophy prescription. She is almost out and needs that paperwork completed. Has enough xultophy for the weekend.   Paper for disability/student loans need a different signature- Dr. Annie Paras signature was not accepted- she is waiting to talk to Susan Peck.

## 2018-06-14 ENCOUNTER — Telehealth: Payer: Self-pay | Admitting: *Deleted

## 2018-06-14 NOTE — Telephone Encounter (Addendum)
Information was sent to [employer] for PA for [employer].  PA # [id number] W.  Awaiting determination.  Sharol Harness 06/14/2018 2:51 PM Call to [employer] as patient has Medicare Part D.  Call to [place] unable to run as Medicare Part D.  Call to [employer] for PA.  Approved 06/17/2018 thru 12/31/2-20.  Sander Nephew, RN 06/17/2018 9:53 AM.

## 2018-06-14 NOTE — Telephone Encounter (Signed)
Informed pt PA for her Claris Che is being worked on currently and to bring letter that she needs signature on to office.

## 2018-06-17 NOTE — Telephone Encounter (Signed)
LMOM for patient to bring her paperwork in to the front desk.

## 2018-06-27 ENCOUNTER — Ambulatory Visit (INDEPENDENT_AMBULATORY_CARE_PROVIDER_SITE_OTHER): Payer: Medicare Other

## 2018-06-27 DIAGNOSIS — I5022 Chronic systolic (congestive) heart failure: Secondary | ICD-10-CM | POA: Diagnosis not present

## 2018-06-27 DIAGNOSIS — I428 Other cardiomyopathies: Secondary | ICD-10-CM | POA: Diagnosis not present

## 2018-06-27 DIAGNOSIS — Z95810 Presence of automatic (implantable) cardiac defibrillator: Secondary | ICD-10-CM | POA: Diagnosis not present

## 2018-06-27 NOTE — Progress Notes (Signed)
Remote ICD transmission.   

## 2018-06-28 ENCOUNTER — Telehealth: Payer: Self-pay

## 2018-06-28 NOTE — Progress Notes (Signed)
EPIC Encounter for ICM Monitoring  Patient Name: Susan Peck is a 52 y.o. female Date: 06/28/2018 Primary Care Physican: Corinne Ports, MD Primary Cardiologist:Bensimhon Electrophysiologist:Klein Bi-V Pacing:99.7%  Last Weight: 187lbs (184-195 lbs) Today's Weight:  unknown       Attempted call to patient and unable to reach.  Left detailed message, per DPR, regarding transmission.  Transmission reviewed.    Thoracic impedance normal.   Prescribed: Furosemide40 mg take 2 tablets (80 mg total) every morning and 1 tablet (40 mg total) every evening.  Labs: 02/20/2018 Creatinine 0.59, BUN 19, Potassium 3.7, Sodium 142 09/25/2017 Creatinine 1.07, BUN 20, Potassium 4.2, Sodium 136, EGFR 69.27  Recommendations: Left voice mail with ICM number and encouraged to call if experiencing any fluid symptoms.  Follow-up plan: ICM clinic phone appointment on 07/29/2018    Copy of ICM check sent to Dr. Caryl Comes.   3 month ICM trend: 06/28/2018    1 Year ICM trend:       [name], RN 06/28/2018 7:55 AM

## 2018-06-28 NOTE — Telephone Encounter (Signed)
Remote ICM transmission received.  Attempted call to patient regarding ICM remote transmission and left detailed message, per DPR, with next ICM remote transmission date of 07/29/2018.  Advised to return call for any fluid symptoms or questions.

## 2018-07-02 ENCOUNTER — Encounter: Payer: Self-pay | Admitting: Internal Medicine

## 2018-07-02 ENCOUNTER — Ambulatory Visit (INDEPENDENT_AMBULATORY_CARE_PROVIDER_SITE_OTHER): Payer: Medicare Other | Admitting: Internal Medicine

## 2018-07-02 ENCOUNTER — Other Ambulatory Visit: Payer: Self-pay

## 2018-07-02 VITALS — BP 118/93 | HR 76 | Temp 98.2°F | Ht 64.0 in | Wt 191.0 lb

## 2018-07-02 DIAGNOSIS — I110 Hypertensive heart disease with heart failure: Secondary | ICD-10-CM | POA: Diagnosis not present

## 2018-07-02 DIAGNOSIS — M25511 Pain in right shoulder: Secondary | ICD-10-CM

## 2018-07-02 DIAGNOSIS — Z0000 Encounter for general adult medical examination without abnormal findings: Secondary | ICD-10-CM

## 2018-07-02 DIAGNOSIS — M79604 Pain in right leg: Secondary | ICD-10-CM

## 2018-07-02 DIAGNOSIS — Z794 Long term (current) use of insulin: Secondary | ICD-10-CM

## 2018-07-02 DIAGNOSIS — E785 Hyperlipidemia, unspecified: Secondary | ICD-10-CM

## 2018-07-02 DIAGNOSIS — E119 Type 2 diabetes mellitus without complications: Secondary | ICD-10-CM

## 2018-07-02 DIAGNOSIS — I5022 Chronic systolic (congestive) heart failure: Secondary | ICD-10-CM

## 2018-07-02 DIAGNOSIS — Z9989 Dependence on other enabling machines and devices: Secondary | ICD-10-CM

## 2018-07-02 DIAGNOSIS — G8921 Chronic pain due to trauma: Secondary | ICD-10-CM

## 2018-07-02 DIAGNOSIS — E118 Type 2 diabetes mellitus with unspecified complications: Secondary | ICD-10-CM | POA: Diagnosis not present

## 2018-07-02 DIAGNOSIS — M79605 Pain in left leg: Secondary | ICD-10-CM

## 2018-07-02 DIAGNOSIS — S81801A Unspecified open wound, right lower leg, initial encounter: Secondary | ICD-10-CM | POA: Insufficient documentation

## 2018-07-02 DIAGNOSIS — K5900 Constipation, unspecified: Secondary | ICD-10-CM

## 2018-07-02 DIAGNOSIS — Z95810 Presence of automatic (implantable) cardiac defibrillator: Secondary | ICD-10-CM

## 2018-07-02 DIAGNOSIS — X58XXXA Exposure to other specified factors, initial encounter: Secondary | ICD-10-CM

## 2018-07-02 DIAGNOSIS — G4700 Insomnia, unspecified: Secondary | ICD-10-CM | POA: Insufficient documentation

## 2018-07-02 DIAGNOSIS — F32A Depression, unspecified: Secondary | ICD-10-CM | POA: Insufficient documentation

## 2018-07-02 DIAGNOSIS — F329 Major depressive disorder, single episode, unspecified: Secondary | ICD-10-CM

## 2018-07-02 DIAGNOSIS — Z79899 Other long term (current) drug therapy: Secondary | ICD-10-CM

## 2018-07-02 DIAGNOSIS — I10 Essential (primary) hypertension: Secondary | ICD-10-CM

## 2018-07-02 LAB — GLUCOSE, CAPILLARY: Glucose-Capillary: 121 mg/dL — ABNORMAL HIGH (ref 70–99)

## 2018-07-02 LAB — POCT GLYCOSYLATED HEMOGLOBIN (HGB A1C): HEMOGLOBIN A1C: 6 % — AB (ref 4.0–5.6)

## 2018-07-02 MED ORDER — MUPIROCIN CALCIUM 2 % EX CREA: 1.0000 "application " | TOPICAL_CREAM | Freq: Two times a day (BID) | CUTANEOUS | 1 refills | Status: DC

## 2018-07-02 MED ORDER — DICLOFENAC SODIUM 1 % TD GEL: 2.0000 g | Freq: Four times a day (QID) | TRANSDERMAL | 0 refills | Status: DC | PRN

## 2018-07-02 NOTE — Assessment & Plan Note (Addendum)
HPI: Patient continues to have bilateral leg pain and experienced no relief after starting gabapentin qhs. She reports that the pain only occurs at night. Sometimes she feels like she needs to move her legs, so she gets up and walks around. This relieves her pain. She describes the pain as someone pouring alcohol onto a cut all over her legs. She has been having trouble falling asleep due to this pain.   Assessment: Her pain is most likely due to restless leg syndrome given that it only occurs at night and improves with movement. Will check ferritin today. If low, will replace with iron supplementation. If normal, will start ropinirole. B12 and TSH were normal 02/2018.  Plan 1. Stop gabapentin 2. Ferritin level today. If low, start iron supplementation. If normal, start ropinirole.   Addendum: Ferritin wnl and >75. Therefore, will start ropinirole to be taken 1-3 hours before bedtime. Will titrate from 0.25mg  to 1mg  every evening over the next week. Will reassess symptoms via phone at that time to determine the need to increase the dose.

## 2018-07-02 NOTE — Assessment & Plan Note (Addendum)
BP well-controlled on current regimen at 118/93.   Plan 1. Continue current regimen of Coreg 25mg , eplerenone 25mg  daily, sacubitril-valsartan 97-103mg , and lasix 40mg  daily.

## 2018-07-02 NOTE — Progress Notes (Signed)
error 

## 2018-07-02 NOTE — Assessment & Plan Note (Addendum)
Patient reports that constipation has improved with PRN Miralax. She does not need to take it every day.   Plan 1. Continue PRN miralax

## 2018-07-02 NOTE — Patient Instructions (Addendum)
It was a pleasure seeing you today, Ms. Pilkenton!  1. For the lesion on your leg - This is likely a very superficial infection of a hair follicle. - Start applying mupirocin ointment (which is an antibiotic) to the area twice a day.  - Please call our clinic if the area does not improve or if you begin to have fevers   2. For your nighttime leg pain  - Stop taking gabapentin - Ferritin level today - If the ferritin (iron level) is low, we will start an iron supplement. If the ferritin is normal, we will start a medication for restless leg syndrome called Ropinirole.  - I will call you with the results of the blood work.   3. For your mood - I expect that your mood is related to your difficulty sleeping. Once we work-up the leg pain, we can reassess your symptoms. If things worsen before that time, please call me.   4. For your right shoulder pain  - I have refilled your Voltaren gel - You can also alternate between ibuprofen and tylenol as needed for pain  - I have referred you to [place]. You should hear from them about setting up an appointment.   5. Please call me about the type of mammogram I should order for you.  6. Your disability paperwork has been filled out.   7. Your diabetes is well-controlled today. Keep up the good work!  Please come back to see me in 3 months or sooner if a problem arises.  Thanks, Dr. Annie Paras

## 2018-07-02 NOTE — Progress Notes (Signed)
   CC: DM follow-up  HPI:   [name] is a 52 y.o. female with a medical history as below who presents to the internal medicine clinic for a follow-up of her diabetes. HTN, HLD, constipation, insomnia, lower extremity pain, and a right anterior leg lesion were also discussed at this visit. Please see problem based charting for the history and status of the patient's current and chronic medical conditions.   Past Medical History:  Diagnosis Date  . AICD (automatic cardioverter/defibrillator) present 12/13/2016  . Anxiety    no meds  . Asthma   . CHF (congestive heart failure) ([place])   . Depression    no meds  . Diabetes mellitus    recent dx 11/17/11 - started med 11/18/11 type 2  . GERD (gastroesophageal reflux disease)   . Goiter 07/27/2011   non-neoplastic goiter - fine needle aspiration - benign sees dr Dwyane Dee for  . Heart murmur    dx 2 yrs ago per pt  . Herpes genitalis in women   . Hypertension   . IBS (irritable bowel syndrome)    tx with diet per pt  . Low back pain    history  . Obesity   . Seasonal allergies   . Shortness of breath    occasional - exercise induced  . Sleep apnea    cpap broken  . Systolic heart failure    May 2011 EF 35-40%, 04/03/11 EF 25-30%, 06/27/11 EF 30-35%    Review of Systems:   Pertinent positives mentioned in HPI. Remainder of all ROS negative.  Physical Exam: Vitals:   07/02/18 1419  BP: (!) 118/93  Pulse: 76  Temp: 98.2 F (36.8 C)  TempSrc: Oral  SpO2: 96%  Weight: 191 lb (86.6 kg)  Height: 5\' 4"  (1.626 m)   Physical Exam  Constitutional: Well-developed, well-nourished, and in no distress.  Eyes: Pupils are equal, round, and reactive to light. EOM are normal.  Cardiovascular: Normal rate and regular rhythm. No murmurs, rubs, or gallops. Pulmonary/Chest: Effort normal. Clear to auscultation bilaterally. No wheezes, rales, or rhonchi. Abdominal: Bowel sounds present. Soft, non-distended, non-tender. Ext: Right upper  extremity with normal strength and sensation. Patient has pain with extension, abduction, and external rotation, but full ROM. No lower extremity edema. Skin: Warm and dry. There is a 2x2cm round lesion on the right anterior shin with a violaceous border and translucent center with underlying green exudate. The lesion is not actively draining. There is faint surrounding erythema that extends about 1 mm from the lesion. There is underlying induration, but no fluctuance.   Assessment & Plan:   See Encounters Tab for problem based charting.  Patient seen with Dr. Angelia Peck

## 2018-07-02 NOTE — Assessment & Plan Note (Signed)
Patient would like to get a screening mammogram, but she is concerned about how the test will affect her ICD, which is mobile. She was told by the radiology tech that there is an alternative test that does not involve as much physical manipulation of the breast. I am unaware of this test and asked the patient to ask both radiology and her cardiology team about this. If it exists, I will place the order. She is not a candidate for breast MRI due to the ICD.

## 2018-07-02 NOTE — Assessment & Plan Note (Signed)
HPI: Patient first noticed pain on her right anterior shin 3 days ago. Since then, she has developed a lesion that occasionally drains pus spontaneously. The area is tender. She denies fevers, chills, or other rashes. She shaves her legs, but denies any other trauma to the area. She has not tried any medications or creams for the pain.   Assessment: There is a 2x2cm round lesion on the right anterior shin with a violaceous border and translucent center with underlying green exudate. The lesion is not actively draining. There is faint surrounding erythema that extends about 1 mm from the lesion. There is underlying induration, but no fluctuance. This most likely represents a superficial skin infection probably from an ingrown hair. Systemic abx are not required at this time. There is no abscess to drain. Will prescribe mupirocin. Strict return precautions if she develops fever, chills, expanding erythema, or other concerning symptoms.   Plan 1. Mupirocin 2. F/u if lesion does not improve

## 2018-07-02 NOTE — Assessment & Plan Note (Signed)
A1c decreased from 6.8 three months ago to 6.0 today.   Plan 1. Continue Xultophy 12u daily.

## 2018-07-02 NOTE — Assessment & Plan Note (Addendum)
HPI: Patient reports trouble falling asleep and staying asleep over the past month. She attributes this to many things including her bilateral leg pain and the sensation to get up and move, right shoulder pain that is worse when she lays down, racing thoughts at night about nothing in particular, nocturia when she takes an extra dose of lasix, and the discomfort of her CPAP (which she wears 2-5 hours per night). She listens to music before bed, which does help her fall asleep.   Assessment: Many factors are contributing to the patient's inability to sleep. I advised her to continue using her CPAP as much as she can at night and to take her extra dose of lasix earlier in the day if needed. Will address her leg pain first because this seems to be the number one attributing factor for her insomnia.  Plan 1. Reassess symptoms after treating leg pain

## 2018-07-02 NOTE — Assessment & Plan Note (Signed)
Patient continues to have dyspnea on exertion that affects her daily activities. She cannot grocery shop or do laundry due to the dyspnea. She reports adherence with all of her medications. She follows closely with the cardiology team about her ICD. She takes an extra dose of lasix on days when her legs feel swollen. She appears euvolemic on exam today.   Plan 1. Continue current medication regimen 2. Disability paperwork filled out

## 2018-07-02 NOTE — Assessment & Plan Note (Signed)
PHQ9 is 10 today. She reports that she has felt down recently, but attributes this to her lack of restful sleep. She has struggled with depression in the past and has been on Cymbalta, but she does not remember if that was helpful for her. She is not currently on any antidepressants. Will address the patient's leg pain and insomnia first and then reassess her mood symptoms. The patient agrees with this.  Plan 1. Reassess symptoms at f/u in 3 months  2. If still depressed with good sleep, consider restarting Cymbalta

## 2018-07-02 NOTE — Assessment & Plan Note (Signed)
Patient has a chronic history of right shoulder pain since she had a fall over one year ago. Right shoulder CT in 09/2017 showed low-grade articular surface fraying of the mid posterior supraspinatus tendon fibers and a small posterosuperior labral tear. She has seen both PT and an orthopedist at [facility]. She has had a steroid injection in the past without relief. She reports that her pain has been worse in recent weeks, particularly when she lays down at night. She has tried voltaren gel and ibuprofen in the past with relief, but has not been using these recently. She would like to go back to [facility] as well.   Assessment: On exam, the patient has normal strength, sensation, and ROM of the right shoulder. She has a "pulling" pain with full extension, abduction, and external rotation. Most likely continued MSK pain from prior injury. Advised the patient to start doing the exercises she learned during PT as well as resume PRN treatment with voltaren gel, ibuprofen, and tylenol. Will not pursue joint injection because it was unhelpful in the past. Will refer to orthopedics per patient's request.  Plan 1. Voltaren gel PRN for pain 2. Alternate between ibuprofen and tylenol PRN for pain 3. Resume PT exercises at home 4. Referral to [facility]

## 2018-07-03 ENCOUNTER — Encounter: Payer: Self-pay | Admitting: Cardiology

## 2018-07-03 LAB — FERRITIN: Ferritin: 207 ng/mL — ABNORMAL HIGH (ref 15–150)

## 2018-07-03 MED ORDER — ROPINIROLE HCL 1 MG PO TABS: 1.0000 mg | ORAL_TABLET | Freq: Every evening | ORAL | 0 refills | Status: DC

## 2018-07-03 MED ORDER — ROPINIROLE HCL 0.25 MG PO TABS: 0.2500 mg | ORAL_TABLET | Freq: Every evening | ORAL | 0 refills | Status: DC

## 2018-07-03 NOTE — Progress Notes (Signed)
Internal Medicine Clinic Attending  I saw and evaluated the patient.  I personally confirmed the key portions of the history and exam documented by Dr. Dorrell and I reviewed pertinent patient test results.  The assessment, diagnosis, and plan were formulated together and I agree with the documentation in the resident's note. 

## 2018-07-03 NOTE — Addendum Note (Signed)
Addended by: Corinne Ports on: 07/03/2018 10:20 AM   Modules accepted: Orders

## 2018-07-09 ENCOUNTER — Telehealth: Payer: Self-pay | Admitting: Internal Medicine

## 2018-07-09 NOTE — Telephone Encounter (Signed)
Left a voicemail for Susan Peck to call the clinic back. I want to inform her that her ferritin level was normal and that I have sent in a prescription for ropinirole for restless leg syndrome as we talked about at her last visit. The ropinirole should be taken 1-3 hours before bedtime. She will gradually increase the dose. She should take 0.25 mg (1 pill) days 1 and 2. Then she should take 0.5mg  (2 pills) days 3, 4, 5, and 6. On day 7, she should switch over to the 1mg  pill and take 1 pill nightly.

## 2018-07-10 ENCOUNTER — Telehealth: Payer: Self-pay

## 2018-07-10 NOTE — Telephone Encounter (Signed)
Pt is calling back to speak with a nurse. 

## 2018-07-10 NOTE — Telephone Encounter (Signed)
Gave pt dr dorrell's instructions, she voiced back

## 2018-07-25 ENCOUNTER — Other Ambulatory Visit: Payer: Self-pay | Admitting: Internal Medicine

## 2018-07-25 NOTE — Telephone Encounter (Signed)
Next appt scheduled 10/01/18 with PCP. 

## 2018-07-29 ENCOUNTER — Ambulatory Visit (INDEPENDENT_AMBULATORY_CARE_PROVIDER_SITE_OTHER): Payer: Medicare Other

## 2018-07-29 DIAGNOSIS — Z95810 Presence of automatic (implantable) cardiac defibrillator: Secondary | ICD-10-CM

## 2018-07-29 DIAGNOSIS — I5022 Chronic systolic (congestive) heart failure: Secondary | ICD-10-CM | POA: Diagnosis not present

## 2018-07-30 NOTE — Progress Notes (Signed)
EPIC Encounter for ICM Monitoring  Patient Name: Susan Peck is a 53 y.o. female Date: 07/30/2018 Primary Care Physican: Corinne Ports, MD Primary Cardiologist:Bensimhon Electrophysiologist:Klein Bi-V Pacing:99.9%   Last Weight: 187lbs (184-195 lbs) Today's Weight:  unknown       Transmission received.   Thoracic impedance normal.  Prescribed dosage: Furosemide40 mg take 2 tablets (80 mg total) every morning and 1 tablet (40 mg total) every evening.  Labs: 02/20/2018 Creatinine 0.59, BUN 19, Potassium 3.7, Sodium 142 09/25/2017 Creatinine 1.07, BUN 20, Potassium 4.2, Sodium 136, EGFR 69.27  Recommendations: None.  Follow-up plan: ICM clinic phone appointment on 09/02/2018.    Copy of ICM check sent to Dr. Caryl Comes.   3 month ICM trend: 07/29/2018    1 Year ICM trend:       [name], RN 07/30/2018 1:01 PM

## 2018-08-06 ENCOUNTER — Encounter: Payer: Self-pay | Admitting: Internal Medicine

## 2018-08-12 LAB — CUP PACEART REMOTE DEVICE CHECK
Battery Voltage: 2.98 V
Brady Statistic AP VP Percent: 0.93 %
Brady Statistic AP VS Percent: 0.01 %
Brady Statistic AS VP Percent: 99.01 %
Brady Statistic RA Percent Paced: 0.94 %
HIGH POWER IMPEDANCE MEASURED VALUE: 63 Ohm
Implantable Lead Implant Date: 20180524
Implantable Lead Implant Date: 20180524
Implantable Lead Location: 753859
Implantable Lead Location: 753860
Implantable Lead Model: 5076
Lead Channel Impedance Value: 141.867
Lead Channel Impedance Value: 141.867
Lead Channel Impedance Value: 141.867
Lead Channel Impedance Value: 152 Ohm
Lead Channel Impedance Value: 304 Ohm
Lead Channel Impedance Value: 361 Ohm
Lead Channel Impedance Value: 456 Ohm
Lead Channel Impedance Value: 475 Ohm
Lead Channel Impedance Value: 475 Ohm
Lead Channel Impedance Value: 513 Ohm
Lead Channel Impedance Value: 532 Ohm
Lead Channel Impedance Value: 608 Ohm
Lead Channel Pacing Threshold Pulse Width: 0.4 ms
Lead Channel Pacing Threshold Pulse Width: 0.4 ms
Lead Channel Sensing Intrinsic Amplitude: 25.125 mV
Lead Channel Sensing Intrinsic Amplitude: 25.125 mV
Lead Channel Setting Pacing Amplitude: 1.25 V
Lead Channel Setting Pacing Amplitude: 2.5 V
Lead Channel Setting Pacing Pulse Width: 0.4 ms
Lead Channel Setting Pacing Pulse Width: 0.4 ms
MDC IDC LEAD IMPLANT DT: 20180524
MDC IDC LEAD LOCATION: 753858
MDC IDC MSMT BATTERY REMAINING LONGEVITY: 83 mo
MDC IDC MSMT LEADCHNL LV IMPEDANCE VALUE: 152 Ohm
MDC IDC MSMT LEADCHNL LV IMPEDANCE VALUE: 266 Ohm
MDC IDC MSMT LEADCHNL LV IMPEDANCE VALUE: 304 Ohm
MDC IDC MSMT LEADCHNL LV IMPEDANCE VALUE: 304 Ohm
MDC IDC MSMT LEADCHNL LV IMPEDANCE VALUE: 532 Ohm
MDC IDC MSMT LEADCHNL LV PACING THRESHOLD AMPLITUDE: 0.75 V
MDC IDC MSMT LEADCHNL RA IMPEDANCE VALUE: 361 Ohm
MDC IDC MSMT LEADCHNL RA PACING THRESHOLD AMPLITUDE: 0.75 V
MDC IDC MSMT LEADCHNL RA SENSING INTR AMPL: 3.875 mV
MDC IDC MSMT LEADCHNL RA SENSING INTR AMPL: 3.875 mV
MDC IDC MSMT LEADCHNL RV PACING THRESHOLD AMPLITUDE: 0.5 V
MDC IDC MSMT LEADCHNL RV PACING THRESHOLD PULSEWIDTH: 0.4 ms
MDC IDC PG IMPLANT DT: 20180524
MDC IDC SESS DTM: 20191205111606
MDC IDC SET LEADCHNL RA PACING AMPLITUDE: 1.5 V
MDC IDC SET LEADCHNL RV SENSING SENSITIVITY: 0.3 mV
MDC IDC STAT BRADY AS VS PERCENT: 0.05 %
MDC IDC STAT BRADY RV PERCENT PACED: 99.74 %

## 2018-08-13 ENCOUNTER — Other Ambulatory Visit: Payer: Self-pay | Admitting: Internal Medicine

## 2018-08-14 ENCOUNTER — Encounter: Payer: Self-pay | Admitting: Internal Medicine

## 2018-08-14 NOTE — Telephone Encounter (Signed)
I will plan to call her today.

## 2018-08-19 ENCOUNTER — Encounter: Payer: Self-pay | Admitting: Internal Medicine

## 2018-08-19 ENCOUNTER — Ambulatory Visit (INDEPENDENT_AMBULATORY_CARE_PROVIDER_SITE_OTHER): Payer: Medicare Other | Admitting: Internal Medicine

## 2018-08-19 VITALS — BP 132/84 | HR 73 | Ht 64.0 in | Wt 188.6 lb

## 2018-08-19 DIAGNOSIS — Z95810 Presence of automatic (implantable) cardiac defibrillator: Secondary | ICD-10-CM | POA: Diagnosis not present

## 2018-08-19 DIAGNOSIS — I428 Other cardiomyopathies: Secondary | ICD-10-CM | POA: Diagnosis not present

## 2018-08-19 DIAGNOSIS — I5022 Chronic systolic (congestive) heart failure: Secondary | ICD-10-CM

## 2018-08-19 DIAGNOSIS — I447 Left bundle-branch block, unspecified: Secondary | ICD-10-CM | POA: Diagnosis not present

## 2018-08-19 NOTE — Progress Notes (Signed)
Patient Care Team: Dorrell, Andree Elk, MD as PCP - General   HPI  Susan Peck is a 53 y.o. female   Seen in followup for ICD implanted 5/18 for primary prevention for NICM    DATE TEST EF   9/12 Echo  25-30%   2/17 Cath 35 % Normal CA  2/17 Echo 30-35 %   3/18 Echo  20-25%   3/19 Echo  45-50%     she has had hx of diaphragmatic stimulation requiring reprogramming and she comes in with complaints of recurrences  She denies chest pain sob or edema  Has complaints of pain around her device site.   Date Cr K Mg  8/18  0.57 4.3   3/19 1.07 4.2 1.8  7/19 0.59 3.7   1/19             Past Medical History:  Diagnosis Date  . AICD (automatic cardioverter/defibrillator) present 12/13/2016  . Anxiety    no meds  . Asthma   . CHF (congestive heart failure) ([place])   . Depression    no meds  . Diabetes mellitus    recent dx 11/17/11 - started med 11/18/11 type 2  . GERD (gastroesophageal reflux disease)   . Goiter 07/27/2011   non-neoplastic goiter - fine needle aspiration - benign sees dr Dwyane Dee for  . Heart murmur    dx 2 yrs ago per pt  . Herpes genitalis in women   . Hypertension   . IBS (irritable bowel syndrome)    tx with diet per pt  . Low back pain    history  . Obesity   . Seasonal allergies   . Shortness of breath    occasional - exercise induced  . Sleep apnea    cpap broken  . Systolic heart failure    May 2011 EF 35-40%, 04/03/11 EF 25-30%, 06/27/11 EF 30-35%    Past Surgical History:  Procedure Laterality Date  . ABDOMINAL HYSTERECTOMY    . BIV ICD INSERTION CRT-D N/A 12/13/2016   Procedure: BiV ICD Insertion CRT-D;  Surgeon: Deboraha Sprang, MD;  Location: [facility];  Service: Cardiovascular;  Laterality: N/A;  . cardiac catherization  2004   [place], [place] - Dr Lynnell Jude  . CARDIAC CATHETERIZATION    . CARDIAC CATHETERIZATION N/A 09/15/2015   Procedure: Right/Left Heart Cath and Coronary Angiography;  Surgeon: Jolaine Artist, MD;  Location: [facility];  Service: Cardiovascular;  Laterality: N/A;  . COLONOSCOPY WITH PROPOFOL N/A 03/26/2015   Procedure: COLONOSCOPY WITH PROPOFOL;  Surgeon: Carol Ada, MD;  Location: [facility];  Service: Endoscopy;  Laterality: N/A;  . colonscopy    . CYSTOSCOPY  11/23/2011   Procedure: CYSTOSCOPY;  Surgeon: Jolayne Haines, MD;  Location: [facility];  Service: Gynecology;  Laterality: N/A;  . DIAGNOSTIC LAPAROSCOPY     of pelvis  . DILATION AND CURETTAGE OF UTERUS  12/2006,  10/2005   hysteroscopy surgery x 2  . INSERTION OF ICD  12/13/2016   BIV  . LAPAROSCOPIC ASSISTED VAGINAL HYSTERECTOMY  11/23/2011   Procedure: LAPAROSCOPIC ASSISTED VAGINAL HYSTERECTOMY;  Surgeon: Jolayne Haines, MD;  Location: [facility];  Service: Gynecology;  Laterality: N/A;  . NOVASURE ABLATION     10/2005  . SALPINGOOPHORECTOMY  11/23/2011   Procedure: SALPINGO OOPHERECTOMY;  Surgeon: Jolayne Haines, MD;  Location: [facility];  Service: Gynecology;  Laterality: Bilateral;  . SVD     x  3  . TOOTH EXTRACTION N/A 04/05/2017   Procedure: DENTAL EXTRACTIONS OF TEETH FIVE, SEVEN, SIXTEEN, SEVENTEEN;  Surgeon: Diona Browner, DDS;  Location: [facility];  Service: Oral Surgery;  Laterality: N/A;  . TUBAL LIGATION    . WISDOM TOOTH EXTRACTION      Current Outpatient Medications  Medication Sig Dispense Refill  . albuterol (PROVENTIL HFA;VENTOLIN HFA) 108 (90 Base) MCG/ACT inhaler Inhale 1-2 puffs into the lungs every 6 (six) hours as needed for wheezing or shortness of breath. 1 Inhaler 0  . aspirin 81 MG chewable tablet Chew 81 mg by mouth daily.     . carvedilol (COREG) 25 MG tablet Take 1 tablet (25 mg total) by mouth 2 (two) times daily with a meal. 60 tablet 11  . diclofenac sodium (VOLTAREN) 1 % GEL Apply 2 g topically 4 (four) times daily as needed (pain). 100 g 0  . eplerenone (INSPRA) 25 MG tablet TAKE 0.5 TABLETS (12.5 MG TOTAL) BY MOUTH DAILY. 45 tablet 1  . fluticasone (FLONASE) 50 MCG/ACT nasal  spray PLACE 1 SPRAY INTO BOTH NOSTRILS 2 (TWO) TIMES DAILY. 16 g 0  . furosemide (LASIX) 40 MG tablet TAKE 2 TABLETS (80 MG TOTAL) BY MOUTH EVERY MORNING AND 1 TABLET (40 MG TOTAL) EVERY EVENING. 270 tablet 1  . glucose blood (ONETOUCH VERIO) test strip Insulin dependent. Check blood sugar up to 3 times daily. diag code E11.65 100 each 12  . Insulin Pen Needle 32G X 4 MM MISC Use to inject xultophy daily 100 each 5  . mupirocin cream (BACTROBAN) 2 % Apply 1 application topically 2 (two) times daily. 15 g 1  . ondansetron (ZOFRAN ODT) 4 MG disintegrating tablet Take 1 tablet (4 mg total) by mouth every 8 (eight) hours as needed for nausea or vomiting. Place under tongue 20 tablet 1  . ONETOUCH DELICA LANCETS FINE MISC Insulin dependent. Check blood sugar up to 3 times daily. diag code E11.65 100 each 12  . PATADAY 0.2 % SOLN Place 1 drop into both eyes 2 (two) times daily.   4  . polyethylene glycol (MIRALAX / GLYCOLAX) packet Take 17 g by mouth daily. 14 each 0  . rOPINIRole (REQUIP) 1 MG tablet Take 1 tablet (1 mg total) by mouth every evening. Take 1-3 hours before bed. 30 tablet 3  . sacubitril-valsartan (ENTRESTO) 97-103 MG Take 1 tablet by mouth 2 (two) times daily. 60 tablet 11  . sodium chloride (OCEAN) 0.65 % SOLN nasal spray Place 2 sprays into both nostrils daily as needed for congestion. 15 mL 1  . Insulin Degludec-Liraglutide (XULTOPHY) 100-3.6 UNIT-MG/ML SOPN Inject 12 Units/day into the skin at bedtime. 9 pen 11  . loratadine (CLARITIN) 10 MG tablet Take 1 tablet (10 mg total) by mouth daily. 30 tablet 2  . ranitidine (ZANTAC) 150 MG tablet Take 1 tablet (150 mg total) by mouth 2 (two) times daily. 180 tablet 3   No current facility-administered medications for this visit.     Allergies  Allergen Reactions  . Shrimp [Shellfish Allergy] Anaphylaxis      Review of Systems negative except from HPI and PMH  Physical Exam BP 132/84   Pulse 73   Ht 5\' 4"  (1.626 m)   Wt 188 lb  9.6 oz (85.5 kg)   BMI 32.37 kg/m  Well developed and nourished in no acute distress HENT normal Neck supple with JVP-flat Clear Device pocket well healed; without hematoma or erythema.  There is no tethering small keloid  Regular rate and rhythm, no murmurs or gallops Abd-soft with active BS No Clubbing cyanosis edema Skin-warm and dry A & Oriented  Grossly normal sensory and motor function    ECG P-synchronous/ AV  pacing  with upright QRS Vq and neg lead 1  Assessment and  Plan  NICM  CHF chronic systolic  Diaphragmatic stimulation  CRT- D Medtronic   device has been reprogrammed to decrease the likelihood of diaphragmatic stimulation going from 2-3 to 1> coil     Euvolemic continue current meds        Current medicines are reviewed at length with the patient today .  The patient does not  have concerns regarding medicines.

## 2018-08-19 NOTE — Patient Instructions (Signed)
Medication Instructions:  Your physician recommends that you continue on your current medications as directed. Please refer to the Current Medication list given to you today.  Labwork: You will have labs drawn today: BMP  Testing/Procedures: None ordered.  Follow-Up: Your physician recommends that you schedule a follow-up appointment in:   One Year with Dr Caryl Comes  Any Other Special Instructions Will Be Listed Below (If Applicable).     If you need a refill on your cardiac medications before your next appointment, please call your pharmacy.

## 2018-08-20 LAB — BASIC METABOLIC PANEL
BUN/Creatinine Ratio: 25 — ABNORMAL HIGH (ref 9–23)
BUN: 16 mg/dL (ref 6–24)
CALCIUM: 10.1 mg/dL (ref 8.7–10.2)
CHLORIDE: 103 mmol/L (ref 96–106)
CO2: 23 mmol/L (ref 20–29)
Creatinine, Ser: 0.64 mg/dL (ref 0.57–1.00)
GFR, EST AFRICAN AMERICAN: 119 mL/min/{1.73_m2} (ref >59)
GFR, EST NON AFRICAN AMERICAN: 103 mL/min/{1.73_m2} (ref >59)
Glucose: 116 mg/dL — ABNORMAL HIGH (ref 65–99)
POTASSIUM: 4.1 mmol/L (ref 3.5–5.2)
Sodium: 144 mmol/L (ref 134–144)

## 2018-08-21 ENCOUNTER — Ambulatory Visit: Payer: Medicare Other | Admitting: Licensed Clinical Social Worker

## 2018-08-21 ENCOUNTER — Encounter: Payer: Self-pay | Admitting: Licensed Clinical Social Worker

## 2018-08-21 DIAGNOSIS — F4322 Adjustment disorder with anxiety: Secondary | ICD-10-CM

## 2018-08-21 LAB — CUP PACEART INCLINIC DEVICE CHECK
Brady Statistic AP VP Percent: 1.19 %
Brady Statistic AP VS Percent: 0.01 %
Brady Statistic AS VS Percent: 0.04 %
Brady Statistic RA Percent Paced: 1.2 %
Brady Statistic RV Percent Paced: 99.89 %
Date Time Interrogation Session: 20200127204944
HIGH POWER IMPEDANCE MEASURED VALUE: 72 Ohm
Implantable Lead Implant Date: 20180524
Implantable Lead Location: 753859
Implantable Lead Model: 4398
Implantable Lead Model: 5076
Implantable Pulse Generator Implant Date: 20180524
Lead Channel Impedance Value: 161.5 Ohm
Lead Channel Impedance Value: 170.472
Lead Channel Impedance Value: 170.472
Lead Channel Impedance Value: 170.472
Lead Channel Impedance Value: 180.5 Ohm
Lead Channel Impedance Value: 323 Ohm
Lead Channel Impedance Value: 475 Ohm
Lead Channel Impedance Value: 532 Ohm
Lead Channel Impedance Value: 608 Ohm
Lead Channel Impedance Value: 608 Ohm
Lead Channel Impedance Value: 608 Ohm
Lead Channel Impedance Value: 627 Ohm
Lead Channel Pacing Threshold Amplitude: 0.5 V
Lead Channel Pacing Threshold Amplitude: 0.75 V
Lead Channel Pacing Threshold Pulse Width: 0.4 ms
Lead Channel Pacing Threshold Pulse Width: 0.4 ms
Lead Channel Setting Pacing Amplitude: 2.5 V
Lead Channel Setting Pacing Pulse Width: 0.4 ms
Lead Channel Setting Sensing Sensitivity: 0.3 mV
MDC IDC LEAD IMPLANT DT: 20180524
MDC IDC LEAD IMPLANT DT: 20180524
MDC IDC LEAD LOCATION: 753858
MDC IDC LEAD LOCATION: 753860
MDC IDC MSMT BATTERY REMAINING LONGEVITY: 79 mo
MDC IDC MSMT BATTERY VOLTAGE: 2.95 V
MDC IDC MSMT LEADCHNL LV IMPEDANCE VALUE: 323 Ohm
MDC IDC MSMT LEADCHNL LV IMPEDANCE VALUE: 323 Ohm
MDC IDC MSMT LEADCHNL LV IMPEDANCE VALUE: 361 Ohm
MDC IDC MSMT LEADCHNL LV IMPEDANCE VALUE: 361 Ohm
MDC IDC MSMT LEADCHNL LV IMPEDANCE VALUE: 532 Ohm
MDC IDC MSMT LEADCHNL LV PACING THRESHOLD AMPLITUDE: 0.625 V
MDC IDC MSMT LEADCHNL RA IMPEDANCE VALUE: 399 Ohm
MDC IDC MSMT LEADCHNL RA SENSING INTR AMPL: 2.625 mV
MDC IDC MSMT LEADCHNL RA SENSING INTR AMPL: 3 mV
MDC IDC MSMT LEADCHNL RV PACING THRESHOLD PULSEWIDTH: 0.4 ms
MDC IDC MSMT LEADCHNL RV SENSING INTR AMPL: 27.75 mV
MDC IDC MSMT LEADCHNL RV SENSING INTR AMPL: 31.25 mV
MDC IDC SET LEADCHNL LV PACING AMPLITUDE: 1 V
MDC IDC SET LEADCHNL LV PACING PULSEWIDTH: 1 ms
MDC IDC SET LEADCHNL RA PACING AMPLITUDE: 1.5 V
MDC IDC STAT BRADY AS VP PERCENT: 98.76 %

## 2018-08-21 NOTE — BH Specialist Note (Signed)
Integrated Behavioral Health Initial Visit  MRN: [id number] Name: Susan Peck  Number of [facility] Clinician visits:: 1/6 Session Start time: 11:40  Session End time: 12:20 Total time: 40 minutes  Type of Service: Integrated Behavioral Health- Individual/Family Interpretor:No.          SUBJECTIVE: Susan Peck is a 53 y.o. female  whom attended the session individually.  Patient was a self-referral for depression and anxiety. Patient reports the following symptoms/concerns: anxiety, periods of sadness, and interpersonal relationship issues.  Duration of problem: increased in the past month; Severity of problem: mild  OBJECTIVE: Mood: Depressed and Affect: Tearful Risk of harm to self or others: No plan to harm self or others  LIFE CONTEXT: Family and Social: Patient has her own home. Patient reported that her daughter and grandchild moved in with her around one month ago. Patient is unhappy with her daughter living with her, and is hopeful that she will move out soon. Patient reported increased periods of sadness due to a recent breakup with a man she was dating.  School/Work: Patient is on disability. Self-Care: Patient goes to church, and talks to her family as a way to cope with anxiety.  Life Changes: Patient's daughter and granddaughter moved in with her. Patient's boyfriend ended their relationship with very little warning to the patient.   GOALS ADDRESSED: Patient will: 1. Reduce symptoms of: anxiety, depression and stress 2. Increase knowledge and/or ability of: coping skills, healthy habits and stress reduction  3. Demonstrate ability to: Increase healthy adjustment to current life circumstances and Increase adequate support systems for patient/family  INTERVENTIONS: Interventions utilized: Motivational Interviewing and Supportive Counseling  Standardized Assessments completed: Not Needed  ASSESSMENT: Patient currently experiencing  increased anxiety due to her living environment changing. Patient reported her anxiety would decrease if she lived alone again. Patient was tearful in the session, and reported that a man she was dating for the past three months ended their relationship around three weeks ago. Patient is trying to adjust to being single. Patient reported that she needs companionship. Patient uses her faith and family as a way to cope with anxiety and periods of sadness.    Patient may benefit from bi-weekly outpatient therapy.  PLAN: 1. Follow up with behavioral health clinician on : one to two weeks.   Dessie Coma, LPC, LCAS

## 2018-08-27 ENCOUNTER — Telehealth: Payer: Self-pay

## 2018-08-27 ENCOUNTER — Ambulatory Visit (INDEPENDENT_AMBULATORY_CARE_PROVIDER_SITE_OTHER): Payer: Medicare Other | Admitting: Internal Medicine

## 2018-08-27 ENCOUNTER — Encounter: Payer: Self-pay | Admitting: Internal Medicine

## 2018-08-27 ENCOUNTER — Other Ambulatory Visit: Payer: Self-pay

## 2018-08-27 VITALS — BP 152/89 | HR 80 | Wt 192.3 lb

## 2018-08-27 DIAGNOSIS — Z8679 Personal history of other diseases of the circulatory system: Secondary | ICD-10-CM

## 2018-08-27 DIAGNOSIS — R05 Cough: Secondary | ICD-10-CM

## 2018-08-27 DIAGNOSIS — R059 Cough, unspecified: Secondary | ICD-10-CM

## 2018-08-27 DIAGNOSIS — Z79899 Other long term (current) drug therapy: Secondary | ICD-10-CM

## 2018-08-27 DIAGNOSIS — R0789 Other chest pain: Secondary | ICD-10-CM | POA: Diagnosis not present

## 2018-08-27 DIAGNOSIS — R51 Headache: Secondary | ICD-10-CM | POA: Diagnosis not present

## 2018-08-27 MED ORDER — GUAIFENESIN-CODEINE 100-10 MG/5ML PO SOLN: 5.0000 mL | Freq: Three times a day (TID) | ORAL | 0 refills | Status: DC | PRN

## 2018-08-27 NOTE — Telephone Encounter (Signed)
Pt calls and states that her insurance, medicare and medicaid will not pay for cough syrup. She is advised that medicaid and medicare typically do not pay for cough medications. She states it is 21.00, advised to try the OTC equivalent, she states it does not work. Advised to call her supplemental insurance and ask about payment. She is agreeable

## 2018-08-27 NOTE — Progress Notes (Signed)
   CC: Cough   HPI:  [name] is a 53 y.o. year-old female with PMH listed below who presents to clinic for acute cough. Please see problem based assessment and plan for further details.   Past Medical History:  Diagnosis Date  . AICD (automatic cardioverter/defibrillator) present 12/13/2016  . Anxiety    no meds  . Asthma   . CHF (congestive heart failure) ([facility])   . Depression    no meds  . Diabetes mellitus    recent dx 11/17/11 - started med 11/18/11 type 2  . GERD (gastroesophageal reflux disease)   . Goiter 07/27/2011   non-neoplastic goiter - fine needle aspiration - benign sees dr Dwyane Dee for  . Heart murmur    dx 2 yrs ago per pt  . Herpes genitalis in women   . Hypertension   . IBS (irritable bowel syndrome)    tx with diet per pt  . Low back pain    history  . Obesity   . Seasonal allergies   . Shortness of breath    occasional - exercise induced  . Sleep apnea    cpap broken  . Systolic heart failure    May 2011 EF 35-40%, 04/03/11 EF 25-30%, 06/27/11 EF 30-35%   Review of Systems:   Review of Systems  Constitutional: Negative for chills, fever and malaise/fatigue.  Respiratory: Positive for cough and sputum production. Negative for hemoptysis, shortness of breath and wheezing.   Cardiovascular: Positive for chest pain (chest wall pain).  Neurological: Positive for headaches. Negative for dizziness.    Physical Exam: Vitals:   08/27/18 0859  BP: (!) 152/89  Pulse: 80  SpO2: 99%  Weight: 192 lb 4.8 oz (87.2 kg)    General: well-appearing female in no acute distress  HENT:  neck supple and FROM, no LAD, OP clear without exudates or erythema  Cardiac: regular rate and rhythm, nl S1/S2, 2/6 SEM murmur best heard at LUSB, no rubs or gallops Pulm: CTAB, no wheezes or crackles, no increased work of breathing on room air    Assessment & Plan:   See Encounters Tab for problem based charting.  Patient discussed with Dr. Eppie Gibson

## 2018-08-27 NOTE — Patient Instructions (Signed)
Ms. Scheunemann,   Your cough is likely due to a viral upper respiratory infection.  This should resolve within the next week.  In the meantime we will treat your symptoms.  Sent a prescription for Robitussin-codeine to your pharmacy.  You can take 15mL 3 times a day as needed for cough.  Medication can cause you to be dizzy and sleepy.   You can also try over-the-counter Coricidin HBP.  This is a decongestant.   Please call us if you have any questions or concerns.  - Dr. Frederico Hamman

## 2018-08-27 NOTE — Assessment & Plan Note (Addendum)
Ms. Detjen presents for evaluation of an acute cough that started 5 days ago and is associated with mild, clear sputum production and nasal congestion.  Her cough worsens at night.  She also endorses a headache and diffuse chest wall pain that worsens when she coughs. She reports sick contacts (takes care of her grandson who is sick with similar symptoms).  She denies fevers, chills, night sweats, shortness of breath, and myalgias. She has a history of well-controlled asthma (only on SABA) and does not report using her inhaler more frequently since her symptoms started. On exam, her lungs sounds clear to auscultation bilaterally and OP is clear without exudates.  Suspect this is a viral URI and will therefore treat conservatively and manage symptoms.  - Robitussin-codeine 37mL TID PRN  - Given history of HTN, recommended OTC Coricidin HBP  - Return precautions discussed, follow up PRN

## 2018-08-27 NOTE — Progress Notes (Signed)
Case discussed with Dr. Santos-Sanchez at the time of the visit.  We reviewed the resident's history and exam and pertinent patient test results.  I agree with the assessment, diagnosis, and plan of care documented in the resident's note.  

## 2018-08-27 NOTE — Telephone Encounter (Signed)
Requesting to speak with a nurse about meds. Please call pt back.  

## 2018-08-27 NOTE — Telephone Encounter (Signed)
rtc to pt, she stated she went back over her paperwork and understands her medication now, states no questions for nurse. Is appreciative, call ended

## 2018-08-29 ENCOUNTER — Ambulatory Visit (INDEPENDENT_AMBULATORY_CARE_PROVIDER_SITE_OTHER): Payer: Medicare Other | Admitting: Licensed Clinical Social Worker

## 2018-08-29 ENCOUNTER — Encounter: Payer: Self-pay | Admitting: Licensed Clinical Social Worker

## 2018-08-29 ENCOUNTER — Other Ambulatory Visit: Payer: Self-pay | Admitting: Dietician

## 2018-08-29 ENCOUNTER — Telehealth: Payer: Self-pay | Admitting: *Deleted

## 2018-08-29 DIAGNOSIS — F4322 Adjustment disorder with anxiety: Secondary | ICD-10-CM

## 2018-08-29 DIAGNOSIS — E1165 Type 2 diabetes mellitus with hyperglycemia: Secondary | ICD-10-CM

## 2018-08-29 MED ORDER — INSULIN DEGLUDEC-LIRAGLUTIDE 100-3.6 UNIT-MG/ML ~~LOC~~ SOPN: 12.0000 [IU]/d | PEN_INJECTOR | Freq: Every day | SUBCUTANEOUS | 11 refills | Status: DC

## 2018-08-29 NOTE — BH Specialist Note (Signed)
Integrated Behavioral Health Follow Up Visit  MRN: [id number] Name: Susan Peck  Number of [facility] Clinician visits: 2/6 Session Start time: 10:10  Session End time: 11:00 Total time: 50 minutes  Type of Service: Integrated Behavioral Health- Individual/Family Interpretor:No.   SUBJECTIVE: Susan Peck is a 53 y.o. female  whom attended the session individually.  Patient was a self-referral for anxiety and depression. Patient reports the following symptoms/concerns: interpersonal relationship issues, anxiety, and periods of sadness. Difficulty with boundaries.  Duration of problem: increased over the past month; Severity of problem: mild  OBJECTIVE: Mood: Anxious and Depressed and Affect: Depressed and Tearful Risk of harm to self or others: No plan to harm self or others  LIFE CONTEXT: Family and Social: Patient's daughter is continuing to live with her. Patient processed her unhealthy pattern over the years with relationships. Patient is continuing to grieve the loss of a recent breakup, and is spending much of her day focused on being single.  Self-Care: Patient reported that she is remaining committed to her 82 day fasting, and connection with God.  Life Changes: Continuing to grieve her recent breakup.   GOALS ADDRESSED: Patient will: 1.  Reduce symptoms of: anxiety, depression and stress  2.  Increase knowledge and/or ability of: coping skills, healthy habits and stress reduction  3.  Demonstrate ability to: Increase healthy adjustment to current life circumstances and Increase adequate support systems for patient/family  INTERVENTIONS: Interventions utilized:  Brief CBT and Supportive Counseling. CBT was used to challenge self-defeating thoughts. The therapist modeled for the patient how to speak kindly to herself, and identify healthy goals. MI was used to identify how the patient could begin establishing healthy boundaries with other to promote  more equal relationships in the future.  Standardized Assessments completed: Not Needed  ASSESSMENT: Patient currently experiencing racing thoughts and increased anxiety around her breakup. Patient is internalizing and having negative self-talk around why her ex-boyfriend broke up with her. Patient struggled to challenge her self-defeating thoughts today. Patient reported he has a long history of needing to be in a relationship with someone, and allows others to take advantage of her kindness. Patient declined any thoughts of SI, HI, or self-harm.    Patient may benefit from bi-weekly outpatient therapy.  PLAN: 1. Follow up with behavioral health clinician on : two weeks.    Dessie Coma, LPC, LCAS

## 2018-08-29 NOTE — Telephone Encounter (Signed)
Requesting refill

## 2018-08-29 NOTE — Telephone Encounter (Signed)
While pt was in clinic this am she came to ask about cough med again, repeated to her that medicaid/ medicare will not cover cough remedies, she will check w/ pharmacy

## 2018-09-02 ENCOUNTER — Ambulatory Visit (INDEPENDENT_AMBULATORY_CARE_PROVIDER_SITE_OTHER): Payer: Medicare Other

## 2018-09-02 ENCOUNTER — Encounter: Payer: Medicare Other | Admitting: Internal Medicine

## 2018-09-02 DIAGNOSIS — I5022 Chronic systolic (congestive) heart failure: Secondary | ICD-10-CM

## 2018-09-02 DIAGNOSIS — Z95810 Presence of automatic (implantable) cardiac defibrillator: Secondary | ICD-10-CM

## 2018-09-03 NOTE — Progress Notes (Signed)
EPIC Encounter for ICM Monitoring  Patient Name: Susan Peck is a 53 y.o. female Date: 09/03/2018 Primary Care Physican: Corinne Ports, MD Primary Cardiologist:Bensimhon Electrophysiologist:Klein Bi-V Pacing:99.9% Last Weight:187lbs (184-195 lbs) Today's Weight: 188 lbs                                                                 Heart failure questions reviewed.  Pt reports having a little shortness of breath and knows she has some fluid.    Thoracic impedance abnormal suggesting fluid accumulation.  Prescribed dosage: Furosemide40 mg take 2 tablets (80 mg total) every morning and 1 tablet (40 mg total) every evening.  Labs: 08/19/2018 Creatinine 0.64, BUN 16, Potassium 4.1, Sodium 144, GFR 103-109 02/20/2018 Creatinine 0.59, BUN 19, Potassium 3.7, Sodium 142 09/25/2017 Creatinine 1.07, BUN 20, Potassium 4.2, Sodium 136, GFR 69.27  Recommendations:  She says she will self adjust the Furosemide and take extra 40 mg for 2 days.    Follow-up plan: ICM clinic phone appointment on 09/06/2018 to recheck fluid levels.    Copy of ICM check sent to Dr. Caryl Comes and Dr Haroldine Laws.   3 month ICM trend: 09/02/2018    1 Year ICM trend:       [name], RN 09/03/2018 10:04 AM

## 2018-09-06 ENCOUNTER — Ambulatory Visit (INDEPENDENT_AMBULATORY_CARE_PROVIDER_SITE_OTHER): Payer: Medicare Other

## 2018-09-06 ENCOUNTER — Telehealth: Payer: Self-pay

## 2018-09-06 DIAGNOSIS — Z95810 Presence of automatic (implantable) cardiac defibrillator: Secondary | ICD-10-CM

## 2018-09-06 DIAGNOSIS — I5022 Chronic systolic (congestive) heart failure: Secondary | ICD-10-CM

## 2018-09-06 NOTE — Progress Notes (Addendum)
EPIC Encounter for ICM Monitoring  Patient Name: Susan Peck is a 53 y.o. female Date: 09/06/2018 Primary Care Physican: Corinne Ports, MD Primary Cardiologist:Bensimhon Electrophysiologist:Klein Bi-V Pacing:99.7% Last Weight:188lbs (184-195 lbs) Today's Weight: unknown  Attempted call to patient and unable to reach.  Left message to return call regarding transmission. Transmission reviewed.    Thoracic impedance improved after taking extra 40 mg Furosemide x 2 days but remains abnormal suggesting fluid accumulation.  Prescribed dosage:Furosemide40 mg take 2 tablets (80 mg total) every morning and 1 tablet (40 mg total) every evening.  Labs: 08/19/2018 Creatinine 0.64, BUN 16, Potassium 4.1, Sodium 144, GFR 103-109 02/20/2018 Creatinine 0.59, BUN 19, Potassium 3.7, Sodium 142 09/25/2017 Creatinine 1.07, BUN 20, Potassium 4.2, Sodium 136, GFR 69.27  Recommendations: Unable to reach.   Follow-up plan: ICM clinic phone appointment on2/21/2020 (manual send) to recheck fluid levels.   Copy of ICM check sent to Dr.Klein and Dr Haroldine Laws.  3 month ICM trend: 09/06/2018    1 Year ICM trend:       [name], RN 09/06/2018 11:05 AM

## 2018-09-06 NOTE — Telephone Encounter (Signed)
Remote ICM transmission received.  Attempted call to patient regarding ICM remote transmission and left detailed message, per DPR, to return call.    

## 2018-09-11 ENCOUNTER — Ambulatory Visit: Payer: Medicare Other | Admitting: Licensed Clinical Social Worker

## 2018-09-16 ENCOUNTER — Telehealth: Payer: Self-pay

## 2018-09-16 NOTE — Telephone Encounter (Signed)
Left message for patient to remind of missed remote transmission.  

## 2018-09-17 NOTE — Progress Notes (Signed)
No ICM remote transmission received for 09/13/2018 and next ICM transmission scheduled for 09/30/2018.

## 2018-09-19 ENCOUNTER — Encounter: Payer: Medicare Other | Admitting: Internal Medicine

## 2018-09-19 ENCOUNTER — Telehealth: Payer: Self-pay | Admitting: *Deleted

## 2018-09-19 ENCOUNTER — Encounter

## 2018-09-19 ENCOUNTER — Ambulatory Visit (INDEPENDENT_AMBULATORY_CARE_PROVIDER_SITE_OTHER): Payer: Medicare Other | Admitting: Licensed Clinical Social Worker

## 2018-09-19 DIAGNOSIS — F4322 Adjustment disorder with anxiety: Secondary | ICD-10-CM

## 2018-09-19 NOTE — Telephone Encounter (Signed)
Pt wants a PA done for guaifeesin-codeine syrup, she states she called insurance company and a rep told her if the doctor would do a PA they would probably approve it. Informed pt that usually medicare and medicaid would not but she ask for imc to attempt, using her asthma and sleep apnea as problems, she states the person she spoke with even said use her cardiac issues. Could you please try?

## 2018-09-19 NOTE — BH Specialist Note (Signed)
Integrated Behavioral Health Follow Up Visit  MRN: [id number] Name: Susan Peck  Number of [facility] Clinician visits: 3/6 Session Start time: 10:31  Session End time: 11:20 Total time: 49 minutes  Type of Service: Integrated Behavioral Health- Individual/Family Interpretor:No.   SUBJECTIVE: Susan Peck is a 53 y.o. female accompanied by whom attended the session individually. Patient reports the following symptoms/concerns: recent sickness, ongoing sleep disturbances, grief, interpersonal relationship issues, anxiety, and depression.  Duration of problem: increased over the past month; Severity of problem: mild  OBJECTIVE: Mood: Anxious and Depressed and Affect: Tearful Risk of harm to self or others: No plan to harm self or others  LIFE CONTEXT: Family and Social: Patient is trying to decrease negative interactions with males, and improve her ability to stay focused on her personal goals. Patient is continuing to have negative emotions and thoughts around the breakup from her last boyfriend.  Self-Care: Patient is continuing to deal with increased sickness, and is working to improve her health. Patient is using her faith, family support, and prayer as a way to cope with interpersonal conflict.  Life Changes: Patient recently lost a friend to Stage 4 Cancer last week.   GOALS ADDRESSED: Patient will: 1.  Reduce symptoms of: agitation, anxiety, depression and insomnia  2.  Increase knowledge and/or ability of: coping skills and healthy habits  3.  Demonstrate ability to: Increase healthy adjustment to current life circumstances, Increase adequate support systems for patient/family and Begin healthy grieving over loss  INTERVENTIONS: Interventions utilized:  Motivational Interviewing, Brief CBT and Supportive Counseling Standardized Assessments completed: assessed for SI, HI, and self-harm.  ASSESSMENT: Patient currently experiencing increased periods of  sadness due to a recent loss of her friend. Patient identified ways that she plans to provide some closure for herself regarding the friend she recently lost.  Patient is continuing to have anxiety daily, and worries about issues out of her control. Patient is acknowledging the severity of her anxiety, and is trying to refocus her thoughts to line up with her personal goals.   Patient may benefit from bi-weekly outpatient therapy.  PLAN: 1. Follow up with behavioral health clinician on : two weeks.   Dessie Coma, LPC, LCAS

## 2018-09-22 ENCOUNTER — Other Ambulatory Visit (HOSPITAL_COMMUNITY): Payer: Self-pay | Admitting: Cardiology

## 2018-09-24 ENCOUNTER — Encounter: Payer: Self-pay | Admitting: Licensed Clinical Social Worker

## 2018-09-26 ENCOUNTER — Encounter: Payer: Medicare Other | Admitting: *Deleted

## 2018-09-26 ENCOUNTER — Encounter: Payer: Self-pay | Admitting: Licensed Clinical Social Worker

## 2018-09-26 ENCOUNTER — Ambulatory Visit (INDEPENDENT_AMBULATORY_CARE_PROVIDER_SITE_OTHER): Payer: Medicare Other | Admitting: Licensed Clinical Social Worker

## 2018-09-26 DIAGNOSIS — F4322 Adjustment disorder with anxiety: Secondary | ICD-10-CM

## 2018-09-26 NOTE — BH Specialist Note (Signed)
Integrated Behavioral Health Follow Up Visit  MRN: [id number] Name: Susan Peck  Number of [place] Clinician visits: 4/6 Session Start time: 10:44  Session End time: 11:10 Total time: 26 minutes  Type of Service: Integrated Behavioral Health- Individual Interpretor:No.   SUBJECTIVE: Susan Peck is a 53 y.o. female accompanied by whom attended the session individually. Patient reports the following symptoms/concerns: decreased depression, grief, increased irritability, and ongoing issues with interpersonal conflict.  Duration of problem: over two months; Severity of problem: mild  OBJECTIVE: Mood: Irritable and Affect: Appropriate Risk of harm to self or others: No plan to harm self or others  LIFE CONTEXT: Family and Social: Patient reported she is frustrated with her daughter, and is making plans to have her move out of her home. Patient feels her daughter is taking advantage of her kindness, and reported her irritability has increased since her daughter moved in with her. Patient is continuing to stay committed to her 53 day fasting(from sex) with God. Self-Care: Improving. Patient is becoming more self-aware, and working towards reaching her values.  Life Changes: Patient is continuing to grieve the loss of her friend.   GOALS ADDRESSED: Patient will: 1.  Reduce symptoms of: agitation and depression  2.  Increase knowledge and/or ability of: coping skills, healthy habits and stress reduction  3.  Demonstrate ability to: Increase healthy adjustment to current life circumstances, Increase adequate support systems for patient/family and Begin healthy grieving over loss  INTERVENTIONS: Interventions utilized:  Motivational Interviewing and Supportive Counseling Standardized Assessments completed: assessed for SI, HI, and self-harm.  ASSESSMENT: Patient currently experiencing increased irritability and family conflict.  Patient is trying to remain  solution-focused with providing her daughter with options to have alternate housing opportunities. Patient is tried of being used in relationships, and is trying to establish healthy boundaries with others. Patient is remaining focused on her personal goals, and reported decreased depressive symptoms.     Patient may benefit from weekly outpatient therapy.   PLAN: 1. Follow up with behavioral health clinician on : one week.   Dessie Coma, LPC, LCAS

## 2018-09-27 ENCOUNTER — Telehealth: Payer: Self-pay

## 2018-09-27 NOTE — Telephone Encounter (Signed)
Spoke with patient to remind of missed remote transmission 

## 2018-09-30 ENCOUNTER — Ambulatory Visit (INDEPENDENT_AMBULATORY_CARE_PROVIDER_SITE_OTHER): Payer: Medicare Other

## 2018-09-30 DIAGNOSIS — Z95810 Presence of automatic (implantable) cardiac defibrillator: Secondary | ICD-10-CM | POA: Diagnosis not present

## 2018-09-30 DIAGNOSIS — I5022 Chronic systolic (congestive) heart failure: Secondary | ICD-10-CM

## 2018-09-30 NOTE — Progress Notes (Signed)
   CC: DM and HTN f/u  HPI:   [name] is a 53 y.o. female with a medical history of DMII, HTN, systolic heart failure with AICD, and the other medical conditions listed below who presents for DM and HTN f/u. She also has viral URI symptoms today. Please see problem based charting for the history and status of the patient's current and chronic medical conditions.   Past Medical History:  Diagnosis Date  . AICD (automatic cardioverter/defibrillator) present 12/13/2016  . Anxiety    no meds  . Asthma   . CHF (congestive heart failure) ([place])   . Depression    no meds  . Diabetes mellitus    recent dx 11/17/11 - started med 11/18/11 type 2  . GERD (gastroesophageal reflux disease)   . Goiter 07/27/2011   non-neoplastic goiter - fine needle aspiration - benign sees dr Dwyane Dee for  . Heart murmur    dx 2 yrs ago per pt  . Herpes genitalis in women   . Hypertension   . IBS (irritable bowel syndrome)    tx with diet per pt  . Low back pain    history  . Obesity   . Seasonal allergies   . Shortness of breath    occasional - exercise induced  . Sleep apnea    cpap broken  . Systolic heart failure    May 2011 EF 35-40%, 04/03/11 EF 25-30%, 06/27/11 EF 30-35%    Review of Systems:   Pertinent positives mentioned in HPI. Remainder of all ROS negative.  Physical Exam: Vitals:   10/01/18 1426  BP: (!) 155/89  Pulse: 80  Temp: 98.1 F (36.7 C)  TempSrc: Oral  SpO2: 97%  Weight: 192 lb 3.2 oz (87.2 kg)  Height: 5\' 4"  (1.626 m)   Physical Exam  Constitutional: Well-developed, well-nourished, and in no distress.  HEENT: Pupils are equal, round, and reactive to light. EOM are normal. Normal oropharynx without erythema or exudate. Bilateral TM visualized without erythema or bulging. Mild bilateral anterior cervical lymphadenopathy that is ttp.  Cardiovascular: Normal rate and regular rhythm. No murmurs, rubs, or gallops. Pulmonary/Chest: Effort normal. Clear to auscultation  bilaterally. No wheezes, rales, or rhonchi. Abdominal: Bowel sounds present. Soft, non-distended, non-tender. Ext: Trace non-pitting lower extremity edema. Skin: Warm and dry. No rashes or wounds.   Assessment & Plan:   See Encounters Tab for problem based charting.  Patient discussed with Dr. Angelia Mould

## 2018-09-30 NOTE — Progress Notes (Signed)
EPIC Encounter for ICM Monitoring  Patient Name: Susan Peck is a 53 y.o. female Date: 09/30/2018 Primary Care Physican: Annie Paras Andree Elk, MD Primary Cardiologist:Bensimhon Electrophysiologist:Klein Bi-V Pacing:99.8% Last Weight:188lbs (184-195 lbs) Today's Weight: 188 lbs  Spoke with patient.  She reports legs feel tight which is her indication that she has fluid accumulating.   She has a cold as well.   Thoracic impedanceabnormalsuggesting fluid accumulation since 09/19/2018.  Prescribed dosage:Furosemide40 mg take 2 tablets (80 mg total) every morning and 1 tablet (40 mg total) every evening.  Labs: 08/19/2018 Creatinine 0.64, BUN 16, Potassium 4.1, Sodium 144, GFR 103-109 02/20/2018 Creatinine 0.59, BUN 19, Potassium 3.7, Sodium 142 09/25/2017 Creatinine 1.07, BUN 20, Potassium 4.2, Sodium 136, GFR 69.27  Recommendations:Her plan is to increase Furosemide to 80 mg twice a day x 2 days  Follow-up plan: ICM clinic phone appointment on3/17/2020 to recheck fluid levels.   Office appt 10/23/2018 with Dr. Haroldine Laws.    Copy of ICM check sent to Dr. Caryl Comes and Dr Haroldine Laws.   3 month ICM trend: 09/30/2018    1 Year ICM trend:       [name], RN 09/30/2018 9:02 AM

## 2018-10-01 ENCOUNTER — Ambulatory Visit (INDEPENDENT_AMBULATORY_CARE_PROVIDER_SITE_OTHER): Payer: Medicare Other | Admitting: Internal Medicine

## 2018-10-01 ENCOUNTER — Ambulatory Visit (INDEPENDENT_AMBULATORY_CARE_PROVIDER_SITE_OTHER): Payer: Medicare Other | Admitting: Licensed Clinical Social Worker

## 2018-10-01 ENCOUNTER — Encounter: Payer: Self-pay | Admitting: Internal Medicine

## 2018-10-01 ENCOUNTER — Encounter: Payer: Medicare Other | Admitting: *Deleted

## 2018-10-01 ENCOUNTER — Other Ambulatory Visit: Payer: Self-pay

## 2018-10-01 VITALS — BP 155/89 | HR 80 | Temp 98.1°F | Ht 64.0 in | Wt 192.2 lb

## 2018-10-01 DIAGNOSIS — F4322 Adjustment disorder with anxiety: Secondary | ICD-10-CM

## 2018-10-01 DIAGNOSIS — Z794 Long term (current) use of insulin: Secondary | ICD-10-CM | POA: Diagnosis not present

## 2018-10-01 DIAGNOSIS — S81801A Unspecified open wound, right lower leg, initial encounter: Secondary | ICD-10-CM

## 2018-10-01 DIAGNOSIS — G2581 Restless legs syndrome: Secondary | ICD-10-CM

## 2018-10-01 DIAGNOSIS — J069 Acute upper respiratory infection, unspecified: Secondary | ICD-10-CM | POA: Diagnosis not present

## 2018-10-01 DIAGNOSIS — I110 Hypertensive heart disease with heart failure: Secondary | ICD-10-CM

## 2018-10-01 DIAGNOSIS — E1165 Type 2 diabetes mellitus with hyperglycemia: Secondary | ICD-10-CM

## 2018-10-01 DIAGNOSIS — E119 Type 2 diabetes mellitus without complications: Secondary | ICD-10-CM | POA: Diagnosis not present

## 2018-10-01 DIAGNOSIS — F329 Major depressive disorder, single episode, unspecified: Secondary | ICD-10-CM

## 2018-10-01 DIAGNOSIS — F32A Depression, unspecified: Secondary | ICD-10-CM

## 2018-10-01 DIAGNOSIS — I5022 Chronic systolic (congestive) heart failure: Secondary | ICD-10-CM

## 2018-10-01 DIAGNOSIS — Z79899 Other long term (current) drug therapy: Secondary | ICD-10-CM

## 2018-10-01 DIAGNOSIS — G4700 Insomnia, unspecified: Secondary | ICD-10-CM

## 2018-10-01 DIAGNOSIS — I10 Essential (primary) hypertension: Secondary | ICD-10-CM

## 2018-10-01 DIAGNOSIS — Z95810 Presence of automatic (implantable) cardiac defibrillator: Secondary | ICD-10-CM

## 2018-10-01 LAB — GLUCOSE, CAPILLARY: Glucose-Capillary: 137 mg/dL — ABNORMAL HIGH (ref 70–99)

## 2018-10-01 LAB — POCT GLYCOSYLATED HEMOGLOBIN (HGB A1C): HEMOGLOBIN A1C: 6.5 % — AB (ref 4.0–5.6)

## 2018-10-01 MED ORDER — ROPINIROLE HCL 1 MG PO TABS: 2.0000 mg | ORAL_TABLET | Freq: Every evening | ORAL | 3 refills | Status: DC

## 2018-10-01 NOTE — Patient Instructions (Signed)
It was a pleasure seeing you today, Susan Peck!  1. For your diabetes - Your A1c was 6.5 today. Keep up the great work.  2. For your high blood pressure - Take your medications every day  3. For your heart failure - Increase your lasix dose to 80mg  twice a day for two days to get the excess fluid off. Then go back to your normal dosing schedule.  4. For your difficulty sleeping - Increase to ropinirole 1.5mg  for one week. Then increase to 2mg  every night.  - You can start taking a melatonin supplement to help with sleep. These can be found over the counter  5. You can also start taking a multivitamin if you desire.   6. For your cold - You don't need antibiotics today - Continue treatment with rest, fluids, flonase, and cough medication - Return to clinic if your symptoms do not improve over the next week  Please follow-up in 6 months   Thanks, Dr. Annie Paras

## 2018-10-02 ENCOUNTER — Encounter: Payer: Self-pay | Admitting: Licensed Clinical Social Worker

## 2018-10-02 ENCOUNTER — Encounter: Payer: Self-pay | Admitting: Internal Medicine

## 2018-10-02 DIAGNOSIS — Z794 Long term (current) use of insulin: Principal | ICD-10-CM

## 2018-10-02 DIAGNOSIS — E119 Type 2 diabetes mellitus without complications: Secondary | ICD-10-CM | POA: Insufficient documentation

## 2018-10-02 DIAGNOSIS — J069 Acute upper respiratory infection, unspecified: Secondary | ICD-10-CM

## 2018-10-02 NOTE — Assessment & Plan Note (Signed)
Susan Peck reports that she has had symptoms of runny nose, congestion, and cough for three days. She states she has many sick contacts in her family with the same symptoms. She has been taking OTC cough medication and Flonase. She denies dyspnea, fever, or myalgias.   Assessment: Viral URI without complicating factors or symptoms. Exam with mild tender cervical lymphadenopathy, otherwise benign. No concern for sinusitis at this time.   Plan - Supportive treatment with rest, fluids, and over the counter tylenol for fevers or pain  - Advised to return to clinic if symptoms do not improve

## 2018-10-02 NOTE — Assessment & Plan Note (Signed)
Patient continues to have difficulty both falling asleep and staying asleep. She is bothered both by the restless feeling in her legs and by a restless feeling in her mind. She constantly thinks about her stressors when she is trying to sleep.   Plan - Increase ropinirole - Recommended melatonin supplement - Continue therapy with behavioral health

## 2018-10-02 NOTE — Assessment & Plan Note (Signed)
Patient reports her symptoms improved after starting ropinirole, but she is still bothered by restless legs at night.   Plan - Taper up from ropinirole 1mg  qhs to 2mg  qhs

## 2018-10-02 NOTE — Assessment & Plan Note (Signed)
A1c 6.5 today. Well-controlled on current regimen.  Plan - Continue Xultophy 12u daily - Repeat A1c in 6 months

## 2018-10-02 NOTE — Assessment & Plan Note (Signed)
Lesion has healed over well. Nothing to do.

## 2018-10-02 NOTE — Assessment & Plan Note (Signed)
PHQ-9 is 6 today. Improved from last visit. She reports that her therapy with Miquel Dunn of behavioral health is going well. She feels like she is making healthy changes in her life.   Plan - Continue behavioral health therapy - Consider pharmacotherapy in the future if patient is interested

## 2018-10-02 NOTE — Assessment & Plan Note (Signed)
Denies dyspnea. Reports mild increased swelling in her lower extremities. Has not taken her lasix the last few days. A note from her ICD/EP nurse yesterday advised the patient to increase her lasix dose to 80mg  BID for two days because the ICD showed thoracic impedence suggesting fluid accumulation. Her normal lasix home dose is 80mg  qam and 40mg  qpm. She has trace nonpitting BLE, but she does not appear volume overloaded on exam.  Plan - Increase to lasix 80mg  BID for 2-3 days. Then return to home dose.  - F/u with ICM clinic on 3/17 - F/u with Dr. Haroldine Laws on 4/1

## 2018-10-02 NOTE — BH Specialist Note (Signed)
Integrated Behavioral Health Follow Up Visit  MRN: [id number] Name: Susan Peck  Number of [place] Clinician visits: 5/6 Session Start time: 1:43  Session End time: 2:15 Total time: 32 minutes  Type of Service: Integrated Behavioral Health- Individual Interpretor:No.   SUBJECTIVE: Susan Peck is a 53 y.o. female  whom attended the session individually.  Patient reports the following symptoms/concerns: increased irritability, increased anxiety, decreased depression, grief, and ongoing issues with interpersonal conflict.  Duration of problem: over two months; Severity of problem: mild  OBJECTIVE: Mood: Irritable and Affect: Appropriate Risk of harm to self or others: No plan to harm self or others  LIFE CONTEXT: Family and Social: Patient is continuing to report increased frustration towards her daughter living in her home. Patient has provided her daughter with a date to move out, and find other housing options. Patient is continuing to grieve the loss of her close friend. Self-Care: Improving. Patient is working on continuing to use her faith as a Financial planner. Patient is reaching out to natural supports regularly.  Life Changes: Grief.  GOALS ADDRESSED: Patient will: 1.  Reduce symptoms of: agitation, anxiety and stress  2.  Increase knowledge and/or ability of: coping skills, healthy habits and stress reduction  3.  Demonstrate ability to: Increase healthy adjustment to current life circumstances, Increase adequate support systems for patient/family and Begin healthy grieving over loss  INTERVENTIONS: Interventions utilized:  Solution-Focused Strategies and Supportive Counseling Standardized Assessments completed: assessed for SI, HI, and self-harm.  ASSESSMENT: Patient currently experiencing increased agitation and anxiety.  Patient was angry, and reported feeling used by her daughter. Patient is trying to set healthy limits with her  daughter at times, but this area needs improvement. Patient believes that her anxiety and irritability will decrease after her daughter/grand-daughter move out of her home.  Patient is continuing to grieve the loss of her close friend. Patient followed through on some of the items she wanted made to assist with his memory. Patient is also continuing to question why her previous boyfriend ended their relationship, and is having difficulty moving forward.    Patient may benefit from weekly outpatient therapy.  PLAN: 1. Follow up with behavioral health clinician on : one week.   Dessie Coma, LPC, LCAS

## 2018-10-02 NOTE — Assessment & Plan Note (Signed)
BP elevated to 155/89. Patient reports she hasn't taken any of her medications in the past few days because her throat has been sore. She says she otherwise is compliant with all her medications and will start taking them again. On review of her medication dispenses, it is unclear whether she has been getting her Coreg and Entresto. She states she does not need refills of these medications at this time and has been taking them daily except the past few days.   Plan - Resume current regimen of Coreg 25 mg BID, eplerenone 12.5mg  daily, sacubitril-valsartan 97-103 daily, and lasix 80mg  qam and 40mg  qpm.

## 2018-10-04 NOTE — Progress Notes (Signed)
Internal Medicine Clinic Attending  Case discussed with Dr. Dorrell at the time of the visit.  We reviewed the resident's history and exam and pertinent patient test results.  I agree with the assessment, diagnosis, and plan of care documented in the resident's note.    

## 2018-10-07 ENCOUNTER — Telehealth: Payer: Self-pay

## 2018-10-07 ENCOUNTER — Other Ambulatory Visit: Payer: Self-pay

## 2018-10-07 NOTE — Telephone Encounter (Signed)
Returned patient call as requested by voice mail message.  Patient asked if the remote transmission was receiving today because she was having monitor trouble. Informed the transmission was received and fluid issue has resolved since she increased Furosemide to 80 mg bid x 2 days.  She is feeling better and no more swelling of ankles.  Weight dropped to 181 lbs.  She is feeling much better.  Advised next ICM remote transmission is 11/04/2018 and encouraged to call for fluid symptoms.

## 2018-10-08 ENCOUNTER — Ambulatory Visit (INDEPENDENT_AMBULATORY_CARE_PROVIDER_SITE_OTHER): Payer: Medicare Other

## 2018-10-08 ENCOUNTER — Other Ambulatory Visit: Payer: Self-pay

## 2018-10-08 DIAGNOSIS — I5022 Chronic systolic (congestive) heart failure: Secondary | ICD-10-CM

## 2018-10-08 DIAGNOSIS — Z95810 Presence of automatic (implantable) cardiac defibrillator: Secondary | ICD-10-CM

## 2018-10-08 LAB — CUP PACEART REMOTE DEVICE CHECK
Battery Remaining Longevity: 71 mo
Brady Statistic AP VP Percent: 0.11 %
Date Time Interrogation Session: 20200316162141
HIGH POWER IMPEDANCE MEASURED VALUE: 69 Ohm
Implantable Lead Implant Date: 20180524
Implantable Lead Implant Date: 20180524
Implantable Lead Location: 753858
Implantable Lead Model: 4398
Implantable Pulse Generator Implant Date: 20180524
Lead Channel Impedance Value: 145.871
Lead Channel Impedance Value: 266 Ohm
Lead Channel Impedance Value: 323 Ohm
Lead Channel Impedance Value: 399 Ohm
Lead Channel Impedance Value: 418 Ohm
Lead Channel Impedance Value: 513 Ohm
Lead Channel Pacing Threshold Amplitude: 0.5 V
Lead Channel Pacing Threshold Amplitude: 0.5 V
Lead Channel Pacing Threshold Pulse Width: 0.4 ms
Lead Channel Pacing Threshold Pulse Width: 0.4 ms
Lead Channel Sensing Intrinsic Amplitude: 24.75 mV
Lead Channel Sensing Intrinsic Amplitude: 24.75 mV
Lead Channel Setting Pacing Amplitude: 1 V
Lead Channel Setting Pacing Amplitude: 1.5 V
Lead Channel Setting Pacing Pulse Width: 0.4 ms
Lead Channel Setting Pacing Pulse Width: 1 ms
Lead Channel Setting Sensing Sensitivity: 0.3 mV
MDC IDC LEAD IMPLANT DT: 20180524
MDC IDC LEAD LOCATION: 753859
MDC IDC LEAD LOCATION: 753860
MDC IDC MSMT BATTERY VOLTAGE: 2.98 V
MDC IDC MSMT LEADCHNL LV IMPEDANCE VALUE: 133 Ohm
MDC IDC MSMT LEADCHNL LV IMPEDANCE VALUE: 145.871
MDC IDC MSMT LEADCHNL LV IMPEDANCE VALUE: 145.871
MDC IDC MSMT LEADCHNL LV IMPEDANCE VALUE: 161.5 Ohm
MDC IDC MSMT LEADCHNL LV IMPEDANCE VALUE: 266 Ohm
MDC IDC MSMT LEADCHNL LV IMPEDANCE VALUE: 323 Ohm
MDC IDC MSMT LEADCHNL LV IMPEDANCE VALUE: 361 Ohm
MDC IDC MSMT LEADCHNL LV IMPEDANCE VALUE: 475 Ohm
MDC IDC MSMT LEADCHNL LV IMPEDANCE VALUE: 513 Ohm
MDC IDC MSMT LEADCHNL LV IMPEDANCE VALUE: 532 Ohm
MDC IDC MSMT LEADCHNL LV PACING THRESHOLD PULSEWIDTH: 1 ms
MDC IDC MSMT LEADCHNL RA PACING THRESHOLD AMPLITUDE: 0.75 V
MDC IDC MSMT LEADCHNL RA SENSING INTR AMPL: 2.375 mV
MDC IDC MSMT LEADCHNL RA SENSING INTR AMPL: 2.375 mV
MDC IDC MSMT LEADCHNL RV IMPEDANCE VALUE: 456 Ohm
MDC IDC MSMT LEADCHNL RV IMPEDANCE VALUE: 532 Ohm
MDC IDC SET LEADCHNL RV PACING AMPLITUDE: 2.5 V
MDC IDC STAT BRADY AP VS PERCENT: 0.01 %
MDC IDC STAT BRADY AS VP PERCENT: 99.85 %
MDC IDC STAT BRADY AS VS PERCENT: 0.04 %
MDC IDC STAT BRADY RA PERCENT PACED: 0.12 %
MDC IDC STAT BRADY RV PERCENT PACED: 99.66 %

## 2018-10-09 NOTE — Progress Notes (Signed)
EPIC Encounter for ICM Monitoring  Patient Name: Susan Peck is a 53 y.o. female Date: 10/09/2018 Primary Care Physican: Corinne Ports, MD Primary Cardiologist:Bensimhon Electrophysiologist:Klein Bi-V Pacing:99.8% 09/30/2018 Weight:188lbs (184-195 lbs) 10/07/2018 Weight: 181lbs  Late entry: Spoke with patient on 10/07/2018 and ankle swelling has resolved. She feels better.   Thoracic impedancereturned to normal after taking extra Furosemide x 2 days.  Prescribed dosage:Furosemide40 mg take 2 tablets (80 mg total) every morning and 1 tablet (40 mg total) every evening.  Labs: 08/19/2018 Creatinine 0.64, BUN 16, Potassium 4.1, Sodium 144, GFR 103-109 02/20/2018 Creatinine 0.59, BUN 19, Potassium 3.7, Sodium 142 09/25/2017 Creatinine 1.07, BUN 20, Potassium 4.2, Sodium 136, GFR 69.27  Recommendations:No changes and encouraged to call for fluid symptoms  Follow-up plan: ICM clinic phone appointment on4/13/2020to recheck fluid levels.   Office appt 10/23/2018 with Dr. Haroldine Laws.    Copy of ICM check sent to Dr. Caryl Comes.   3 month ICM trend: 10/08/2018    1 Year ICM trend:       [name], RN 10/09/2018 11:48 AM

## 2018-10-10 ENCOUNTER — Encounter: Payer: Self-pay | Admitting: Internal Medicine

## 2018-10-10 ENCOUNTER — Encounter (HOSPITAL_COMMUNITY): Payer: Self-pay

## 2018-10-16 ENCOUNTER — Telehealth: Payer: Self-pay | Admitting: Licensed Clinical Social Worker

## 2018-10-16 NOTE — Telephone Encounter (Signed)
Patient was informed about services still be offered via telephone. Patient requested to schedule, and will be added to my schedule for 3/26 at 9:15.

## 2018-10-17 ENCOUNTER — Encounter: Payer: Self-pay | Admitting: Licensed Clinical Social Worker

## 2018-10-17 ENCOUNTER — Ambulatory Visit (INDEPENDENT_AMBULATORY_CARE_PROVIDER_SITE_OTHER): Payer: Medicare Other | Admitting: Licensed Clinical Social Worker

## 2018-10-17 ENCOUNTER — Other Ambulatory Visit (HOSPITAL_COMMUNITY): Payer: Self-pay | Admitting: Cardiology

## 2018-10-17 DIAGNOSIS — F4322 Adjustment disorder with anxiety: Secondary | ICD-10-CM

## 2018-10-17 NOTE — BH Specialist Note (Signed)
Integrated Behavioral Health Visit via Telemedicine  10/17/2018 Susan Peck [id number]   Session Start time: 9:35  Session End time: 10:00 Total time: 25 minutes  Referring Provider: self-referral Type of Visit: Telephonic Patient location: Home [facility] Provider location: Remote/Home All persons participating in visit: patient and [facility]  Confirmed patient's address: Yes  Confirmed patient's phone number: Yes  Any changes to demographics: No    Discussed confidentiality: Yes    The following statements were read to the patient and/or legal guardian that are established with the [facility] Provider.  "The purpose of this phone visit is to provide behavioral health care while limiting exposure to the coronavirus (COVID19). "  "By engaging in this telephone visit, you consent to the provision of healthcare.  Additionally, you authorize for your insurance to be billed for the services provided during this telephone visit."   Patient and/or legal guardian consented to telephone visit: Yes   PRESENTING CONCERNS: Patient and/or family reports the following symptoms/concerns: difficulty adjusting to isolation, increased anxiety, agitation, periods of sadness, and issues with ongoing interpersonal relationship issues.  Duration of problem: three months; Severity of problem: mild   GOALS ADDRESSED: Patient will: 1.  Reduce symptoms of: agitation, anxiety and stress  2.  Increase knowledge and/or ability of: coping skills, healthy habits and stress reduction  3.  Demonstrate ability to: Increase healthy adjustment to current life circumstances, Increase adequate support systems for patient/family and build healthy relationships.   INTERVENTIONS: Interventions utilized:  Motivational Interviewing, Brief CBT and Supportive Counseling. MI was used to process how the patient can work towards accepting the end of her relationship. CBT was used to identify how her negative thoughts are  effecting her mood.  Standardized Assessments completed: assessed for SI, HI, and self-harm.  ASSESSMENT: Patient currently experiencing difficulty adjusting to the isolation from the COVID-19. Patient is frustrated because she had planned a beach trip in the future, and it was cancelled due to the COVID-19 virus. Patient is continuing to express agitation, and is trying to challenge her negativity with positivity. Patient does feel her boredom is creating challenges for her to be positive. Patient is denying any thoughts of SI, HI, or self-harm.   Patient is continuing to grieve the loss of a relationship with a man she dated. Patient is attempting to process and accept that her relationship ended abruptly without her desire to end it.   Patient may benefit from bi-weekly outpatient therapy.   PLAN: 1. Follow up with behavioral health clinician on : one week via telephone. Dessie Coma, [facility], [place]

## 2018-10-18 ENCOUNTER — Encounter: Payer: Self-pay | Admitting: Cardiology

## 2018-10-18 NOTE — Progress Notes (Signed)
Patient called after hours today stating that she felt her device causing a "jumping sensation".  She denies chest pain, ICD discharge, dyspnea, palpitations or syncope.  In reviewing her records she has had diaphragmatic stimulation in the past which required reprogramming her device.  She was not particularly bothered by this.  Given the coronavirus pandemic we would like to avoid emergency room visits if possible.  However if her symptoms worsen I have asked her to come to be evaluated for possible reprogramming of her device. Kirk Ruths, MD

## 2018-10-23 ENCOUNTER — Telehealth: Payer: Self-pay | Admitting: Licensed Clinical Social Worker

## 2018-10-23 ENCOUNTER — Encounter (HOSPITAL_COMMUNITY): Payer: Medicare Other | Admitting: Internal Medicine

## 2018-10-23 NOTE — Telephone Encounter (Signed)
Patient is confirmed for her telephone appointment tomorrow.

## 2018-10-24 ENCOUNTER — Encounter: Payer: Self-pay | Admitting: Licensed Clinical Social Worker

## 2018-10-24 ENCOUNTER — Ambulatory Visit (INDEPENDENT_AMBULATORY_CARE_PROVIDER_SITE_OTHER): Payer: Medicare Other | Admitting: Licensed Clinical Social Worker

## 2018-10-24 DIAGNOSIS — F4322 Adjustment disorder with anxiety: Secondary | ICD-10-CM

## 2018-10-24 NOTE — BH Specialist Note (Signed)
Integrated Behavioral Health Visit via Telemedicine (Telephone)  10/24/2018 CAMREIGH MICHIE [id number]   Session Start time: 11:05  Session End time: 11:30 Total time: 25 minutes   Type of Visit: Telephonic Patient location: Home [facility] Provider location: Remote/home All persons participating in visit: patient and [facility]  Confirmed patient's address: Yes  Confirmed patient's phone number: Yes  Any changes to demographics: No   Discussed confidentiality: Yes    The following statements were read to the patient and/or legal guardian that are established with the [facility] Provider.  "The purpose of this phone visit is to provide behavioral health care while limiting exposure to the coronavirus (COVID19).  There is a possibility of technology failure and discussed alternative modes of communication if that failure occurs."  "By engaging in this telephone visit, you consent to the provision of healthcare.  Additionally, you authorize for your insurance to be billed for the services provided during this telephone visit."   Patient and/or legal guardian consented to telephone visit: Yes   PRESENTING CONCERNS: Patient and/or family reports the following symptoms/concerns: anxiety, irritability, and depression. Duration of problem: four months; Severity of problem: mild  STRENGTHS (Protective Factors/Coping Skills): Talking to natural supports.   GOALS ADDRESSED: Patient will: 1.  Reduce symptoms of: agitation, anxiety, depression and stress  2.  Increase knowledge and/or ability of: coping skills, healthy habits and stress reduction  3.  Demonstrate ability to: Increase healthy adjustment to current life circumstances, Increase adequate support systems for patient/family and building healthy relationships.  INTERVENTIONS: Interventions utilized:  Motivational Interviewing, Behavioral Activation, Brief CBT and Supportive Counseling Standardized Assessments completed: assessed for  SI, HI, and self-harm.  ASSESSMENT: Patient currently experiencing decreased anxiety and depression due to feeling she gained closure to a recent relationship. Patient processed how she has come to the conclusion to reduce her negative feelings towards her relationship breakup. Patient accepted that she was unable to travel to the beach for her birthday. Patient found a solution to her problem, and her sister came to visit her. Patient is continuing to stay committed to her 74 days with God, and reported his helping her remain calm/focused.  Patient identified she has been used by others in her life, and is trying to set healthy boundaries with others now. Patient reported she is working on Media planner.   Patient may benefit from bi-weekly counseling.  PLAN: 1. Follow up with behavioral health clinician on : one week.   Dessie Coma, [facility], [place]

## 2018-10-29 ENCOUNTER — Telehealth: Payer: Self-pay | Admitting: Licensed Clinical Social Worker

## 2018-10-29 NOTE — Telephone Encounter (Signed)
Appointment confirmed for tomorrow

## 2018-10-30 ENCOUNTER — Encounter: Payer: Self-pay | Admitting: Licensed Clinical Social Worker

## 2018-10-30 ENCOUNTER — Ambulatory Visit (INDEPENDENT_AMBULATORY_CARE_PROVIDER_SITE_OTHER): Payer: Medicare Other | Admitting: Licensed Clinical Social Worker

## 2018-10-30 DIAGNOSIS — F4322 Adjustment disorder with anxiety: Secondary | ICD-10-CM

## 2018-10-30 NOTE — BH Specialist Note (Signed)
Integrated Behavioral Health Visit via Telemedicine (Telephone)  10/30/2018 ALUNA WHISTON [id number]   Session Start time: 11:05  Session End time: 11:30 Total time: 25 minutes  Type of Visit: Telephonic Patient location: Home [facility] Provider location: Remote/Home All persons participating in visit: patient and [facility]  Confirmed patient's address: Yes  Confirmed patient's phone number: Yes  Any changes to demographics: No   Discussed confidentiality: Yes    The following statements were read to the patient and/or legal guardian that are established with the [facility] Provider.  "The purpose of this phone visit is to provide behavioral health care while limiting exposure to the coronavirus (COVID19).  There is a possibility of technology failure and discussed alternative modes of communication if that failure occurs."  "By engaging in this telephone visit, you consent to the provision of healthcare.  Additionally, you authorize for your insurance to be billed for the services provided during this telephone visit."   Patient and/or legal guardian consented to telephone visit: Yes   PRESENTING CONCERNS: Patient and/or family reports the following symptoms/concerns: difficulties challenges adjusting to the stay-at-home order, poor self-care (eating habits), increased depression and anxiety.  Duration of problem: four months; Severity of problem: mild  STRENGTHS (Protective Factors/Coping Skills): Talking to natural supports  GOALS ADDRESSED: Patient will: 1.  Reduce symptoms of: agitation, anxiety, depression and stress  2.  Increase knowledge and/or ability of: coping skills, healthy habits and stress reduction  3.  Demonstrate ability to: Increase healthy adjustment to current life circumstances, Increase adequate support systems for patient/family and adjustment to increased isolation.  INTERVENTIONS: Interventions utilized:  Motivational Interviewing, Brief CBT,  Supportive Counseling and Sleep Hygiene. Therapist recommended the patient to establish a new schedule and routine to assist with adjusting to the COVID-19 precautions. MI was used to identify healthy coping skills the patient can engage in when feeling overwhelmed.  Standardized Assessments completed: assessed for SI, HI, and self-harm.  ASSESSMENT: Patient currently experiencing increased depression and challenges related to the stay-at-home order. Patient is having difficulty with the forced isolation. Patient's sleep pattern has changed, and she is currently losing interest in activities. Patient feels this year has been a year of loss, and has increased hopelessness. Patient reported she had formed plans for the year, and feels that those plans were taken from her. Patient is continuing to try to adjust to her breakup.   Patient's daughter has been absent for the past two weeks, and the patient has been speaking to her about finding her own home when she returns. Patient wants space in her home, and processed the improvement in her agitation due to her daughter being out of her home.   Patient may benefit from weekly counseling.  PLAN: 1. Follow up with behavioral health clinician on : one week via telephone.   Dessie Coma, [facility], [place]

## 2018-11-04 ENCOUNTER — Ambulatory Visit (INDEPENDENT_AMBULATORY_CARE_PROVIDER_SITE_OTHER): Payer: Medicare Other

## 2018-11-04 ENCOUNTER — Other Ambulatory Visit: Payer: Self-pay

## 2018-11-04 DIAGNOSIS — Z95810 Presence of automatic (implantable) cardiac defibrillator: Secondary | ICD-10-CM | POA: Diagnosis not present

## 2018-11-04 DIAGNOSIS — I5022 Chronic systolic (congestive) heart failure: Secondary | ICD-10-CM

## 2018-11-05 NOTE — Progress Notes (Signed)
EPIC Encounter for ICM Monitoring  Patient Name: Susan Peck is a 53 y.o. female Date: 11/05/2018 Primary Care Physican: Corinne Ports, MD Primary Cardiologist:Bensimhon Electrophysiologist:Klein Bi-V Pacing:99.9% 09/30/2018 Weight:188lbs (184-195 lbs) 10/07/2018 Weight:181 lbs 11/05/2018 Weight: unknown  Attempted call to patient and unable to reach.  Left detailed message per DPR regarding transmission. Transmission reviewed.   Thoracic impedance normal.  Prescribed dosage:Furosemide40 mg take 2 tablets (80 mg total) every morning and 1 tablet (40 mg total) every evening.  Labs: 08/19/2018 Creatinine 0.64, BUN 16, Potassium 4.1, Sodium 144, GFR 103-109 02/20/2018 Creatinine 0.59, BUN 19, Potassium 3.7, Sodium 142 09/25/2017 Creatinine 1.07, BUN 20, Potassium 4.2, Sodium 136, GFR 69.27  Recommendations:Left voice mail with ICM number and encouraged to call if experiencing any fluid symptoms.  Follow-up plan: ICM clinic phone appointment on5/18/2020.  Copy of ICM check sent to Nellie.   3 month ICM trend: 11/04/2018    1 Year ICM trend:       [name], RN 11/05/2018 10:14 AM

## 2018-11-06 ENCOUNTER — Telehealth: Payer: Self-pay

## 2018-11-06 ENCOUNTER — Encounter: Payer: Self-pay | Admitting: Internal Medicine

## 2018-11-06 ENCOUNTER — Encounter (HOSPITAL_COMMUNITY): Payer: Self-pay

## 2018-11-06 NOTE — Telephone Encounter (Signed)
Remote ICM transmission received.  Attempted call to patient regarding ICM remote transmission and left detailed message, per DPR, with next ICM remote transmission date of 12/09/2018.  Advised to return call for any fluid symptoms or questions.    

## 2018-11-07 ENCOUNTER — Encounter: Payer: Self-pay | Admitting: Licensed Clinical Social Worker

## 2018-11-07 ENCOUNTER — Other Ambulatory Visit: Payer: Self-pay | Admitting: Internal Medicine

## 2018-11-07 ENCOUNTER — Ambulatory Visit (INDEPENDENT_AMBULATORY_CARE_PROVIDER_SITE_OTHER): Payer: Medicare Other | Admitting: Licensed Clinical Social Worker

## 2018-11-07 DIAGNOSIS — F4322 Adjustment disorder with anxiety: Secondary | ICD-10-CM

## 2018-11-07 DIAGNOSIS — G2581 Restless legs syndrome: Secondary | ICD-10-CM

## 2018-11-07 NOTE — BH Specialist Note (Signed)
Integrated Behavioral Health Visit via Telemedicine (Telephone)  11/07/2018 Susan Peck [id number]   Session Start time: 11:05  Session End time: 11:45 Total time: 40 minutes  Type of Visit: Telephonic Patient location: [facility] Provider location: Remote/HOme All persons participating in visit: patient/[facility]   Confirmed patient's address: Yes  Confirmed patient's phone number: Yes  Any changes to demographics: No    Discussed confidentiality: Yes    The following statements were read to the patient and/or legal guardian that are established with the [facility] Provider.  "The purpose of this phone visit is to provide behavioral health care while limiting exposure to the coronavirus (COVID19).  There is a possibility of technology failure and discussed alternative modes of communication if that failure occurs."  "By engaging in this telephone visit, you consent to the provision of healthcare.  Additionally, you authorize for your insurance to be billed for the services provided during this telephone visit."   Patient and/or legal guardian consented to telephone visit: Yes   PRESENTING CONCERNS: Patient and/or family reports the following symptoms/concerns: ongoing anxiety, depression, health issues, interpersonal relationship issues, and grief.  Duration of problem: since January; Severity of problem: mild  STRENGTHS (Protective Factors/Coping Skills): Prayer, talking to family members  GOALS ADDRESSED: Patient will: 1.  Reduce symptoms of: agitation, anxiety, depression, stress and grief  2.  Increase knowledge and/or ability of: coping skills, healthy habits and stress reduction  3.  Demonstrate ability to: Increase healthy adjustment to current life circumstances, Increase adequate support systems for patient/family and Begin healthy grieving over loss  INTERVENTIONS: Interventions utilized:  Motivational Interviewing and Supportive Counseling Standardized  Assessments completed: Not Needed  ASSESSMENT: Patient currently experiencing ongoing issues with anxiety, fleeting ideas, and ruminations. Patient has difficulty challenging thoughts related to the past, and ruminates on relationships that did not work out. Patient will practice trying to come back to the present and incorporate her healthy coping skills when overwhlemed. Patient identified future goals, and ways to overcome sadness. Patient is denying any SI, HI, or self-harm.    Patient may benefit from weekly outpatient therapy.  PLAN: 1. Follow up with behavioral health clinician on : one week via telephone.  Dessie Coma, [facility], [place]

## 2018-11-12 ENCOUNTER — Other Ambulatory Visit (HOSPITAL_COMMUNITY): Payer: Self-pay | Admitting: Cardiology

## 2018-11-14 ENCOUNTER — Ambulatory Visit: Payer: Medicare Other | Admitting: Licensed Clinical Social Worker

## 2018-11-14 ENCOUNTER — Encounter: Payer: Self-pay | Admitting: Licensed Clinical Social Worker

## 2018-11-14 NOTE — BH Specialist Note (Signed)
Integrated Behavioral Health Visit via Telemedicine (Telephone)  11/14/2018 Susan Peck [id number]   Session Start time: 12:00  Session End time: 12:40 Total time: 40 minutes  Type of Visit: Telephonic Patient location: Home [facility] Provider location: Remote/Home All persons participating in visit: patient/ [facility]  Confirmed patient's address: Yes  Confirmed patient's phone number: Yes  Any changes to demographics: No   Discussed confidentiality: Yes    The following statements were read to the patient and/or legal guardian that are established with the [facility] Provider.  "The purpose of this phone visit is to provide behavioral health care while limiting exposure to the coronavirus (COVID19).  There is a possibility of technology failure and discussed alternative modes of communication if that failure occurs."  "By engaging in this telephone visit, you consent to the provision of healthcare.  Additionally, you authorize for your insurance to be billed for the services provided during this telephone visit."   Patient and/or legal guardian consented to telephone visit: Yes   PRESENTING CONCERNS: Patient and/or family reports the following symptoms/concerns: ongoing anxiety, depression, health issues, interpersonal relationship issues, and grief. Agitation and trust issues.  Duration of problem: since January; Severity of problem: mild  STRENGTHS (Protective Factors/Coping Skills): Prayer and talking to family members.   GOALS ADDRESSED: Patient will: 1.  Reduce symptoms of: agitation, anxiety and depression, stress and grief.  2.  Increase knowledge and/or ability of: coping skills, healthy habits and stress reduction  3.  Demonstrate ability to: Increase healthy adjustment to current life circumstances, Increase adequate support systems for patient/family, Begin healthy grieving over loss and build healthy realtionships.   INTERVENTIONS: Interventions utilized:   Motivational Interviewing and Supportive Counseling Standardized Assessments completed: Not Needed  ASSESSMENT: Patient currently experiencing ongoing issues with trust issues and sadness.  Patient is continuing to have challenges establishing meaningful and long last relationship with men. Patient is working on putting herself as a priority instead of ignoring her needs. Patient identified her personal goals for the next month, and wants to continue to build her relationship with God. Patient identified that she feels she has to always be independent because she is constantly let down by men.   Patient may benefit from weekly outpatient counseling.  PLAN: 1. Follow up with behavioral health clinician on : one week via telephone.  Dessie Coma, [facility], [place]

## 2018-11-21 ENCOUNTER — Telehealth: Payer: Self-pay | Admitting: Licensed Clinical Social Worker

## 2018-11-21 ENCOUNTER — Ambulatory Visit: Payer: Medicare Other | Admitting: Licensed Clinical Social Worker

## 2018-11-21 NOTE — Telephone Encounter (Signed)
Patient was contacted for our scheduled appointment. Patient did not answer, and a voice mail was left for the patient to contact our office if she would like to reschedule.

## 2018-11-26 ENCOUNTER — Telehealth: Payer: Self-pay

## 2018-11-26 ENCOUNTER — Other Ambulatory Visit: Payer: Self-pay

## 2018-11-26 NOTE — Telephone Encounter (Signed)
Patient called in voice mail stating she needed Rx for cream or an office visit for vaginal dryness.  I called her back and left message for her to call me back to we can discuss symptoms she is experiencing.

## 2018-11-27 ENCOUNTER — Ambulatory Visit (INDEPENDENT_AMBULATORY_CARE_PROVIDER_SITE_OTHER): Payer: Medicare Other | Admitting: Women's Health

## 2018-11-27 ENCOUNTER — Encounter: Payer: Self-pay | Admitting: Women's Health

## 2018-11-27 VITALS — BP 124/82 | Ht 64.0 in | Wt 192.0 lb

## 2018-11-27 DIAGNOSIS — N952 Postmenopausal atrophic vaginitis: Secondary | ICD-10-CM

## 2018-11-27 DIAGNOSIS — N898 Other specified noninflammatory disorders of vagina: Secondary | ICD-10-CM

## 2018-11-27 LAB — WET PREP FOR TRICH, YEAST, CLUE

## 2018-11-27 MED ORDER — FLUCONAZOLE 150 MG PO TABS: 150.0000 mg | ORAL_TABLET | Freq: Once | ORAL | 0 refills | Status: AC

## 2018-11-27 MED ORDER — ESTRADIOL 10 MCG VA TABS: ORAL_TABLET | VAGINAL | 11 refills | Status: DC

## 2018-11-27 NOTE — Progress Notes (Signed)
53 year old SBF G7, P3 presents with complaint of vaginal irritation with intense itching for the past few days.  Denies visible discharge,  urinary symptoms, abdominal/back pain or fever.  Not sexually active denies need for STD screen.  TVH for fibroids on no HRT.  Treated for vaginal dryness with Vagifem in the past states had good relief but did stop after symptoms resolved.  Numerous medical problems including diabetes, CHF, hypertension, asthma, hypercholesteremia, cardiomyopathy and COPD.  Reports blood sugar is slightly high recently.  Exam: Appears well.  No CVAT.  Abdomen obese, nontender, external genitalia extremely erythematous labia minora and introitus, atrophic, atrophic vaginitis  wet prep done with a Q-tip, negative.  Probable yeast vaginitis Vaginal atrophy  Plan: Diflucan 150 p.o. x1 dose, reviewed possibly yeast vaginitis as well as atrophic vaginitis.  Options reviewed, Vagifem 1 applicator at bedtime x2 weeks and then twice weekly vaginally.  Yeast prevention discussed, loose clothing, open to air as able.  Instructed to call if continued problems.

## 2018-11-27 NOTE — Patient Instructions (Signed)
Atrophic Vaginitis Atrophic vaginitis is a condition in which the tissues that line the vagina become dry and thin. This condition occurs in women who have stopped having their period. It is caused by a drop in a female hormone (estrogen). This hormone helps:  To keep the vagina moist.  To make a clear fluid. This clear fluid helps: ? To make the vagina ready for sex. ? To protect the vagina from infection. If the lining of the vagina is dry and thin, it may cause irritation, burning, or itchiness. It may also:  Make sex painful.  Make an exam of your vagina painful.  Cause bleeding.  Make you lose interest in sex.  Cause a burning feeling when you pee (urinate).  Cause a brown or yellow fluid to come from your vagina. Some women do not have symptoms. Follow these instructions at home: Medicines  Take over-the-counter and prescription medicines only as told by your doctor.  Do not use herbs or other medicines unless your doctor says it is okay.  Use medicines for for dryness. These include: ? Oils to make the vagina soft. ? Creams. ? Moisturizers. General instructions  Do not douche.  Do not use products that can make your vagina dry. These include: ? Scented sprays. ? Scented tampons. ? Scented soaps.  Sex can help increase blood flow and soften the tissue in the vagina. If it hurts to have sex: ? Tell your partner. ? Use products to make sex more comfortable. Use these only as told by your doctor. Contact a doctor if you:  Have discharge from the vagina that is different than usual.  Have a bad smell coming from your vagina.  Have new symptoms.  Do not get better.  Get worse. Summary  Atrophic vaginitis is a condition in which the lining of the vagina becomes dry and thin.  This condition affects women who have stopped having their periods.  Treatment may include using products that help make the vagina soft.  Call a doctor if do not get better with  treatment. This information is not intended to replace advice given to you by your health care provider. Make sure you discuss any questions you have with your health care provider. Document Released: 12/27/2007 Document Revised: 07/23/2017 Document Reviewed: 07/23/2017 Elsevier Interactive Patient Education  2019 [employer] Vaginal Yeast infection, Adult  Vaginal yeast infection is a condition that causes vaginal discharge as well as soreness, swelling, and redness (inflammation) of the vagina. This is a common condition. Some women get this infection frequently. What are the causes? This condition is caused by a change in the normal balance of the yeast (candida) and bacteria that live in the vagina. This change causes an overgrowth of yeast, which causes the inflammation. What increases the risk? The condition is more likely to develop in women who:  Take antibiotic medicines.  Have diabetes.  Take birth control pills.  Are pregnant.  Douche often.  Have a weak body defense system (immune system).  Have been taking steroid medicines for a long time.  Frequently wear tight clothing. What are the signs or symptoms? Symptoms of this condition include:  White, thick, creamy vaginal discharge.  Swelling, itching, redness, and irritation of the vagina. The lips of the vagina (vulva) may be affected as well.  Pain or a burning feeling while urinating.  Pain during sex. How is this diagnosed? This condition is diagnosed based on:  Your medical history.  A physical exam.  A pelvic  exam. Your health care provider will examine a sample of your vaginal discharge under a microscope. Your health care provider may send this sample for testing to confirm the diagnosis. How is this treated? This condition is treated with medicine. Medicines may be over-the-counter or prescription. You may be told to use one or more of the following:  Medicine that is taken by mouth (orally).   Medicine that is applied as a cream (topically).  Medicine that is inserted directly into the vagina (suppository). Follow these instructions at home:  Lifestyle  Do not have sex until your health care provider approves. Tell your sex partner that you have a yeast infection. That person should go to his or her health care provider and ask if they should also be treated.  Do not wear tight clothes, such as pantyhose or tight pants.  Wear breathable cotton underwear. General instructions  Take or apply over-the-counter and prescription medicines only as told by your health care provider.  Eat more yogurt. This may help to keep your yeast infection from returning.  Do not use tampons until your health care provider approves.  Try taking a sitz bath to help with discomfort. This is a warm water bath that is taken while you are sitting down. The water should only come up to your hips and should cover your buttocks. Do this 3-4 times per day or as told by your health care provider.  Do not douche.  If you have diabetes, keep your blood sugar levels under control.  Keep all follow-up visits as told by your health care provider. This is important. Contact a health care provider if:  You have a fever.  Your symptoms go away and then return.  Your symptoms do not get better with treatment.  Your symptoms get worse.  You have new symptoms.  You develop blisters in or around your vagina.  You have blood coming from your vagina and it is not your menstrual period.  You develop pain in your abdomen. Summary  Vaginal yeast infection is a condition that causes discharge as well as soreness, swelling, and redness (inflammation) of the vagina.  This condition is treated with medicine. Medicines may be over-the-counter or prescription.  Take or apply over-the-counter and prescription medicines only as told by your health care provider.  Do not douche. Do not have sex or use tampons  until your health care provider approves.  Contact a health care provider if your symptoms do not get better with treatment or your symptoms go away and then return. This information is not intended to replace advice given to you by your health care provider. Make sure you discuss any questions you have with your health care provider. Document Released: 04/19/2005 Document Revised: 11/26/2017 Document Reviewed: 11/26/2017 Elsevier Interactive Patient Education  2019 [employer].

## 2018-11-28 ENCOUNTER — Encounter: Payer: Self-pay | Admitting: Licensed Clinical Social Worker

## 2018-11-28 ENCOUNTER — Ambulatory Visit (INDEPENDENT_AMBULATORY_CARE_PROVIDER_SITE_OTHER): Payer: Medicare Other | Admitting: Licensed Clinical Social Worker

## 2018-11-28 ENCOUNTER — Other Ambulatory Visit (HOSPITAL_COMMUNITY): Payer: Self-pay | Admitting: Internal Medicine

## 2018-11-28 DIAGNOSIS — F4322 Adjustment disorder with anxiety: Secondary | ICD-10-CM

## 2018-11-28 NOTE — BH Specialist Note (Signed)
Integrated Behavioral Health Visit via Telemedicine (Telephone)  11/28/2018 Susan Peck [id number]   Session Start time: 1:46  Session End time: 2:21 Total time: 35 minutes  Type of Visit: Telephonic Patient location: Home [facility] Provider location: Remote All persons participating in visit: patient, [facility]  Confirmed patient's address: Yes  Confirmed patient's phone number: Yes  Any changes to demographics: No    Discussed confidentiality: Yes    The following statements were read to the patient and/or legal guardian that are established with the [facility] Provider.  "The purpose of this phone visit is to provide behavioral health care while limiting exposure to the coronavirus (COVID19).  There is a possibility of technology failure and discussed alternative modes of communication if that failure occurs."  "By engaging in this telephone visit, you consent to the provision of healthcare.  Additionally, you authorize for your insurance to be billed for the services provided during this telephone visit."   Patient and/or legal guardian consented to telephone visit: Yes   PRESENTING CONCERNS: Patient and/or family reports the following symptoms/concerns: ongoing anxiety, depression, health issues, interpersonal relationship issues, and grief.Agitation and trust issues.   Duration of problem: since January; Severity of problem: mild  STRENGTHS (Protective Factors/Coping Skills): Prayer and talking to family members.  GOALS ADDRESSED: Patient will: 1.  Reduce symptoms of: agitation, anxiety and stress  2.  Increase knowledge and/or ability of: coping skills, healthy habits and stress reduction  3.  Demonstrate ability to: Increase healthy adjustment to current life circumstances and Increase adequate support systems for patient/family  INTERVENTIONS: Interventions utilized:  Motivational Interviewing and Supportive Counseling Standardized Assessments completed: assessed  for SI, HI, and self-harm.  ASSESSMENT: Patient currently experiencing ongoing challenges getting over a past failed relationship. Patient acknowledged she needs to work on patience. Patient is continuing . Patient is agitated that men use her, and that she has been unable to engage in a relationship.   Patient may benefit from weekly outpatient therapy.  PLAN: 1. Follow up with behavioral health clinician on : one week via telephone.   Dessie Coma, [facility], [place]

## 2018-12-05 ENCOUNTER — Ambulatory Visit: Payer: Medicare Other | Admitting: Licensed Clinical Social Worker

## 2018-12-05 ENCOUNTER — Encounter: Payer: Self-pay | Admitting: Licensed Clinical Social Worker

## 2018-12-05 NOTE — BH Specialist Note (Signed)
Integrated Behavioral Health Visit via Telemedicine (Telephone)  12/05/2018 Susan Peck [id number]   Session Start time: 12:00  Session End time: 12:30 Total time: 30 minutes  Type of Visit: Telephonic Patient location: Home [facility] Provider location: Remote All persons participating in visit: patient, [facility]  Confirmed patient's address: Yes  Confirmed patient's phone number: Yes  Any changes to demographics: No   Discussed confidentiality: Yes    The following statements were read to the patient and/or legal guardian that are established with the [facility] Provider.  "The purpose of this phone visit is to provide behavioral health care while limiting exposure to the coronavirus (COVID19).  There is a possibility of technology failure and discussed alternative modes of communication if that failure occurs."  "By engaging in this telephone visit, you consent to the provision of healthcare.  Additionally, you authorize for your insurance to be billed for the services provided during this telephone visit."   Patient and/or legal guardian consented to telephone visit: Yes   PRESENTING CONCERNS: Patient and/or family reports the following symptoms/concerns: increased sadness and loneliness, anxiety, interpersonal conflict, and agitation.  Duration of problem: since January; Severity of problem: mild  STRENGTHS (Protective Factors/Coping Skills): Talking to her aunt.   GOALS ADDRESSED: Patient will: 1.  Reduce symptoms of: agitation, anxiety, depression and stress  2.  Increase knowledge and/or ability of: coping skills, healthy habits and stress reduction  3.  Demonstrate ability to: Increase healthy adjustment to current life circumstances, Increase adequate support systems for patient/family and cope loneliness.  INTERVENTIONS: Interventions utilized:  Motivational Interviewing and Supportive Counseling Standardized Assessments completed: assessed for SI, HI, and  self-harm.  ASSESSMENT: Patient currently experiencing increased depression, sadness, and loneliness.  Patient feels she is not appreciated by her family. Two of her children did not reach out to her for Mother's Day, and that increased the patient's sadness. Patient reported she is tired of being used by others. Patient has cried more than normal over the past week, and is second-guessing her purpose. Patient has no SI, HI, and self-harm.    Patient is more angry than normal, and feels God is not protecting her from negative people. Patient processed her feelings of hopelessness and voids she is currently experiencing.   Patient may benefit from weekly counseling.  PLAN: 1. Follow up with behavioral health clinician on : one week.    Dessie Coma, [facility], [place]

## 2018-12-09 ENCOUNTER — Ambulatory Visit (INDEPENDENT_AMBULATORY_CARE_PROVIDER_SITE_OTHER): Payer: Medicare Other

## 2018-12-09 ENCOUNTER — Other Ambulatory Visit: Payer: Self-pay

## 2018-12-09 DIAGNOSIS — I5022 Chronic systolic (congestive) heart failure: Secondary | ICD-10-CM

## 2018-12-09 DIAGNOSIS — Z95810 Presence of automatic (implantable) cardiac defibrillator: Secondary | ICD-10-CM

## 2018-12-09 NOTE — Progress Notes (Signed)
EPIC Encounter for ICM Monitoring  Patient Name: Susan Peck is a 53 y.o. female Date: 12/09/2018 Primary Care Physican: Corinne Ports, MD Primary Cardiologist:Bensimhon Electrophysiologist:Klein Bi-V Pacing:99.8% 3/16/2020Weight:181 lbs 12/09/2018 Weight: unknown  Attempted call to patient and unable to reach.  Left detailed message per DPR regarding transmission. Transmission reviewed.   Optivol Thoracic impedanceabnormal suggesting fluid accumulation since 11/30/2018.  Prescribed dosage:Furosemide40 mg take 2 tablets (80 mg total) every morning and 1 tablet (40 mg total) every evening.  Labs: 08/19/2018 Creatinine 0.64, BUN 16, Potassium 4.1, Sodium 144, GFR 103-109 02/20/2018 Creatinine 0.59, BUN 19, Potassium 3.7, Sodium 142 09/25/2017 Creatinine 1.07, BUN 20, Potassium 4.2, Sodium 136, GFR 69.27  Recommendations:Left voice mail with ICM number and encouraged to call if experiencing any fluid symptoms.  Follow-up plan: ICM clinic phone appointment on5/26/2020 (manual send) to recheck fluid levels.  Copy of ICM check sent to Dr.Klein and Dr Haroldine Laws.   3 month ICM trend: 12/09/2018    1 Year ICM trend:       Susan Billings, RN 12/09/2018 2:17 PM

## 2018-12-11 ENCOUNTER — Telehealth: Payer: Self-pay | Admitting: Licensed Clinical Social Worker

## 2018-12-11 ENCOUNTER — Ambulatory Visit: Payer: Medicare Other | Admitting: Licensed Clinical Social Worker

## 2018-12-11 NOTE — Telephone Encounter (Signed)
Patient was contacted for our scheduled appointment. Patient did not answer, and a voicemail was left for the patient to call our office if she would like to reschedule.

## 2018-12-17 ENCOUNTER — Telehealth: Payer: Self-pay

## 2018-12-17 NOTE — Telephone Encounter (Signed)
Left message for patient to remind of missed remote transmission.  

## 2018-12-18 ENCOUNTER — Encounter: Payer: Self-pay | Admitting: Licensed Clinical Social Worker

## 2018-12-18 ENCOUNTER — Ambulatory Visit (INDEPENDENT_AMBULATORY_CARE_PROVIDER_SITE_OTHER): Payer: Medicare Other | Admitting: Licensed Clinical Social Worker

## 2018-12-18 DIAGNOSIS — F4322 Adjustment disorder with anxiety: Secondary | ICD-10-CM

## 2018-12-18 NOTE — BH Specialist Note (Signed)
Integrated Behavioral Health Visit via Telemedicine (Telephone)  12/18/2018 Susan Peck [id number]   Session Start time: 10:07  Session End time: 10:32 Total time: 25 minutes  Type of Visit: Telephonic Patient location: Home  [facility] Provider location: Remote All persons participating in visit: patient, [facility], and [facility] intern  Confirmed patient's address: Yes  Confirmed patient's phone number: Yes  Any changes to demographics: No   Discussed confidentiality: Yes    The following statements were read to the patient and/or legal guardian that are established with the [facility] Provider.  "The purpose of this phone visit is to provide behavioral health care while limiting exposure to the coronavirus (COVID19).  There is a possibility of technology failure and discussed alternative modes of communication if that failure occurs."  "By engaging in this telephone visit, you consent to the provision of healthcare.  Additionally, you authorize for your insurance to be billed for the services provided during this telephone visit."   Patient and/or legal guardian consented to telephone visit: Yes   PRESENTING CONCERNS: Patient and/or family reports the following symptoms/concerns: anxiety, loneliness, interpersonal conflict, sadness, chronic health issues, and agitation.  Duration of problem: almost six months; Severity of problem: moderate  STRENGTHS (Protective Factors/Coping Skills): Writing poems, listening to music, and connecting with God.   GOALS ADDRESSED: Patient will: 1.  Reduce symptoms of: agitation, anxiety, depression, mood instability and stress  2.  Increase knowledge and/or ability of: coping skills, healthy habits and stress reduction  3.  Demonstrate ability to: Increase healthy adjustment to current life circumstances and Increase adequate support systems for patient/family  INTERVENTIONS: Interventions utilized:  Motivational Interviewing, Behavioral  Activation, Brief CBT and Supportive Counseling Standardized Assessments completed: assessed for SI, HI, and self-harm.  ASSESSMENT: Patient currently experiencing "ups and downs", "good and bad days". Patient reported she is having a hard time dealing with loneliness.  Patient feels very alone on her down days.  Patient reported she can be alone, but she does not want to be. Patient is continuing to try to deal with negative thoughts/feelings towards relationships not working out. Patient identified it is the companionship that she misses the most. Patient acknowledged that she needs to set healthy boundaries with others. Patient could benefit from dealing with her feelings of being used by others.   Patient plans to identify values and traits she is looking for in a partner to provider her some clarity.    Patient may benefit from weekly to bi-weekly outpatient therapy.   PLAN: 1. Follow up with behavioral health clinician on : two weeks via telephone.   Dessie Coma, [facility], [place]

## 2018-12-19 ENCOUNTER — Other Ambulatory Visit: Payer: Self-pay

## 2018-12-20 NOTE — Progress Notes (Signed)
No ICM remote transmission received for 12/17/2018 and next ICM transmission scheduled for 01/13/2019.

## 2018-12-23 ENCOUNTER — Other Ambulatory Visit: Payer: Self-pay

## 2018-12-23 ENCOUNTER — Ambulatory Visit (HOSPITAL_COMMUNITY): Admission: RE | Admit: 2018-12-23 | Payer: Medicare Other | Source: Ambulatory Visit | Admitting: Internal Medicine

## 2018-12-24 ENCOUNTER — Other Ambulatory Visit (HOSPITAL_COMMUNITY): Payer: Self-pay | Admitting: Internal Medicine

## 2019-01-01 ENCOUNTER — Ambulatory Visit: Payer: Medicare Other | Admitting: Licensed Clinical Social Worker

## 2019-01-01 ENCOUNTER — Telehealth: Payer: Self-pay | Admitting: Licensed Clinical Social Worker

## 2019-01-01 NOTE — Telephone Encounter (Signed)
Patient was called twice for her scheduled appointment. Patient did not answer, and a voicemail was left for the patient to reschedule if she would like.

## 2019-01-06 ENCOUNTER — Encounter: Payer: Self-pay | Admitting: Dietician

## 2019-01-06 ENCOUNTER — Ambulatory Visit (INDEPENDENT_AMBULATORY_CARE_PROVIDER_SITE_OTHER): Payer: Medicare Other | Admitting: *Deleted

## 2019-01-06 ENCOUNTER — Ambulatory Visit (INDEPENDENT_AMBULATORY_CARE_PROVIDER_SITE_OTHER): Payer: Medicare Other | Admitting: Dietician

## 2019-01-06 DIAGNOSIS — E1165 Type 2 diabetes mellitus with hyperglycemia: Secondary | ICD-10-CM | POA: Diagnosis not present

## 2019-01-06 DIAGNOSIS — Z713 Dietary counseling and surveillance: Secondary | ICD-10-CM

## 2019-01-06 DIAGNOSIS — I428 Other cardiomyopathies: Secondary | ICD-10-CM

## 2019-01-06 LAB — CUP PACEART REMOTE DEVICE CHECK
Battery Remaining Longevity: 66 mo
Battery Voltage: 2.98 V
Brady Statistic AP VP Percent: 0.33 %
Brady Statistic AP VS Percent: 0.01 %
Brady Statistic AS VP Percent: 99.45 %
Brady Statistic AS VS Percent: 0.21 %
Brady Statistic RA Percent Paced: 0.34 %
Brady Statistic RV Percent Paced: 98.32 %
Date Time Interrogation Session: 20200615073624
HighPow Impedance: 64 Ohm
Implantable Lead Implant Date: 20180524
Implantable Lead Implant Date: 20180524
Implantable Lead Implant Date: 20180524
Implantable Lead Location: 753858
Implantable Lead Location: 753859
Implantable Lead Location: 753860
Implantable Lead Model: 4398
Implantable Lead Model: 5076
Implantable Pulse Generator Implant Date: 20180524
Lead Channel Impedance Value: 141.867
Lead Channel Impedance Value: 145.871
Lead Channel Impedance Value: 145.871
Lead Channel Impedance Value: 156.606
Lead Channel Impedance Value: 161.5 Ohm
Lead Channel Impedance Value: 266 Ohm
Lead Channel Impedance Value: 304 Ohm
Lead Channel Impedance Value: 323 Ohm
Lead Channel Impedance Value: 323 Ohm
Lead Channel Impedance Value: 399 Ohm
Lead Channel Impedance Value: 418 Ohm
Lead Channel Impedance Value: 456 Ohm
Lead Channel Impedance Value: 475 Ohm
Lead Channel Impedance Value: 513 Ohm
Lead Channel Impedance Value: 532 Ohm
Lead Channel Impedance Value: 532 Ohm
Lead Channel Impedance Value: 532 Ohm
Lead Channel Impedance Value: 570 Ohm
Lead Channel Pacing Threshold Amplitude: 0.5 V
Lead Channel Pacing Threshold Amplitude: 0.5 V
Lead Channel Pacing Threshold Amplitude: 0.625 V
Lead Channel Pacing Threshold Pulse Width: 0.4 ms
Lead Channel Pacing Threshold Pulse Width: 0.4 ms
Lead Channel Pacing Threshold Pulse Width: 1 ms
Lead Channel Sensing Intrinsic Amplitude: 2.5 mV
Lead Channel Sensing Intrinsic Amplitude: 2.5 mV
Lead Channel Sensing Intrinsic Amplitude: 26.25 mV
Lead Channel Sensing Intrinsic Amplitude: 26.25 mV
Lead Channel Setting Pacing Amplitude: 1 V
Lead Channel Setting Pacing Amplitude: 1.5 V
Lead Channel Setting Pacing Amplitude: 2.5 V
Lead Channel Setting Pacing Pulse Width: 0.4 ms
Lead Channel Setting Pacing Pulse Width: 1 ms
Lead Channel Setting Sensing Sensitivity: 0.3 mV

## 2019-01-06 NOTE — Progress Notes (Signed)
Diabetes Self-Management Education  Visit Type:  4 Month Follow-Up  Appt. Start Time: 1045 Appt. End Time: 1130  01/06/2019  Susan Peck, identified by name and date of birth, is a 53 y.o. female with a diagnosis of Diabetes:  Marland Kitchen  Type 2 diabetes ASSESSMENT  This was a call to follow up on Ms Walstad's Diabetes Self Management Training and support: she rep[orts her weight is higher:  197#; and so is her blood sugar: 432 blood sugar at time of call, no symptoms reported of high blood sugar although she could name them.  High- tired, fatigue,headches, mouth dry, drinks more than anything, water, juice, blurred vision Low- cold, shaky, dizzy, tired, sweaty, tremors, convulsions  would like to have the  Yellow card that contained  All the foods I can eat, how much to eat, goals weights. This helped her stay on track. Sleep- saying up late,getting up early she verbalizes that this is also something she would like to work on . She reports that family stayed with her during the coronavirus time and got her off her usual routines and depression set in as well. She is working with our Lexicographer and verbalizing positive thoughts towards making progress to get back to taking care of herself.   Plan:due to see Dr. Annie Paras September; get meal planning card for her, follow up in about 3 weeks   Diabetes Self-Management Education - 01/06/19 1700      Outcomes   Program Status  Re-entered       Learning Objective:  Patient will have a greater understanding of diabetes self-management. Patient education plan is to attend individual and/or group sessions per assessed needs and concerns.   Plan:   Patient Instructions  Hi Temisha,   It was good to speak with you today! We talked about ways to care for your diabetes today. I will let you know when I get that meal panning card (if I can). If not, I think I may be able to create one for you.   Let's follow up in 4-6 weeks ( about August  1) or sooner if needed.   Butch Penny [phone]   Expected Outcomes:  Demonstrated interest in learning. Expect positive outcomes  Education material provided: meal plan card  If problems or questions, patient to contact team via:  Phone  Future DSME appointment: - 4-6 wks  [name], RD 01/06/2019 5:21 PM.

## 2019-01-06 NOTE — Patient Instructions (Signed)
Hi Janiah,   It was good to speak with you today! We talked about ways to care for your diabetes today. I will let you know when I get that meal panning card (if I can). If not, I think I may be able to create one for you.   Let's follow up in 4-6 weeks ( about August 1) or sooner if needed.   Butch Penny [phone]

## 2019-01-07 ENCOUNTER — Telehealth: Payer: Self-pay | Admitting: Dietician

## 2019-01-07 ENCOUNTER — Other Ambulatory Visit (HOSPITAL_COMMUNITY): Payer: Self-pay | Admitting: Internal Medicine

## 2019-01-07 NOTE — Telephone Encounter (Signed)
Mailed patient the yellow meal plan card and my plate today as she stated that this was helpful from our last visit.    Antonieta Iba, RD, LDN, CDE

## 2019-01-08 ENCOUNTER — Telehealth: Payer: Self-pay | Admitting: Licensed Clinical Social Worker

## 2019-01-08 ENCOUNTER — Other Ambulatory Visit (HOSPITAL_COMMUNITY): Payer: Self-pay

## 2019-01-08 MED ORDER — FUROSEMIDE 40 MG PO TABS: ORAL_TABLET | ORAL | 0 refills | Status: DC

## 2019-01-08 NOTE — Telephone Encounter (Signed)
Patient was contacted due to missing her last appointment. Patient will be added to my schedule for 6/18 @ 11:30 via telehealth.

## 2019-01-09 ENCOUNTER — Encounter: Payer: Self-pay | Admitting: Licensed Clinical Social Worker

## 2019-01-09 ENCOUNTER — Other Ambulatory Visit: Payer: Self-pay

## 2019-01-09 ENCOUNTER — Ambulatory Visit (INDEPENDENT_AMBULATORY_CARE_PROVIDER_SITE_OTHER): Payer: Medicare Other | Admitting: Licensed Clinical Social Worker

## 2019-01-09 DIAGNOSIS — F4322 Adjustment disorder with anxiety: Secondary | ICD-10-CM

## 2019-01-09 NOTE — BH Specialist Note (Signed)
Integrated Behavioral Health Visit via Telemedicine (Telephone)  01/09/2019 Susan Peck [id number]   Session Start time: 11:30  Session End time: 12:15 Total time: 45 minutes  Type of Visit: Telephonic Patient location: Home [facility] Provider location: Remote All persons participating in visit: [facility], [facility] intern, and partner  Confirmed patient's address: Yes  Confirmed patient's phone number: Yes  Any changes to demographics: No   Discussed confidentiality: Yes    The following statements were read to the patient and/or legal guardian that are established with the [facility] Provider.  "The purpose of this phone visit is to provide behavioral health care while limiting exposure to the coronavirus (COVID19).  There is a possibility of technology failure and discussed alternative modes of communication if that failure occurs."  "By engaging in this telephone visit, you consent to the provision of healthcare.  Additionally, you authorize for your insurance to be billed for the services provided during this telephone visit."   Patient and/or legal guardian consented to telephone visit: Yes   PRESENTING CONCERNS: Patient and/or family reports the following symptoms/concerns: interpersonal issues, anxiety, sadness, and loneliness.  Duration of problem: since January; Severity of problem: mild  STRENGTHS (Protective Factors/Coping Skills): Walking  GOALS ADDRESSED: Patient will: 1.  Reduce symptoms of: anxiety, depression and stress  2.  Increase knowledge and/or ability of: coping skills, healthy habits and stress reduction  3.  Demonstrate ability to: Increase healthy adjustment to current life circumstances and Increase adequate support systems for patient/family  INTERVENTIONS: Interventions utilized:  Motivational Interviewing, Brief CBT and Supportive Counseling Standardized Assessments completed: Not Needed  ASSESSMENT: Patient currently experiencing decreased  depression since the previous session. Patient has started walking since the last session, and is working to increase her self-care. Patient processed how walking is a way for to process her sadness, and she tries to work through her issues during that time-frame. Patient still has a strong desire to have a companion or partner. Patient is currently talking to a man that is incarcerated.   Patient acknowledged that she is taken advantage of by others. Patient is trying to set healthy boundaries with others in order to increase her healthy relationships. Patient lacks female friends and healthy natural supports.   Patient may benefit from bi-weekly outpatient counseling.  PLAN: 1. Follow up with behavioral health clinician on : one week via telephone.   Dessie Coma, [facility], [place]

## 2019-01-12 ENCOUNTER — Encounter: Payer: Self-pay | Admitting: *Deleted

## 2019-01-13 ENCOUNTER — Ambulatory Visit (INDEPENDENT_AMBULATORY_CARE_PROVIDER_SITE_OTHER): Payer: Medicare Other

## 2019-01-13 DIAGNOSIS — Z95810 Presence of automatic (implantable) cardiac defibrillator: Secondary | ICD-10-CM

## 2019-01-13 DIAGNOSIS — I5022 Chronic systolic (congestive) heart failure: Secondary | ICD-10-CM

## 2019-01-14 ENCOUNTER — Encounter: Payer: Self-pay | Admitting: Cardiology

## 2019-01-14 NOTE — Progress Notes (Signed)
Remote ICD transmission.   

## 2019-01-16 ENCOUNTER — Ambulatory Visit (INDEPENDENT_AMBULATORY_CARE_PROVIDER_SITE_OTHER): Payer: Medicare Other | Admitting: Licensed Clinical Social Worker

## 2019-01-16 ENCOUNTER — Encounter: Payer: Self-pay | Admitting: Licensed Clinical Social Worker

## 2019-01-16 DIAGNOSIS — F4322 Adjustment disorder with anxiety: Secondary | ICD-10-CM

## 2019-01-16 NOTE — BH Specialist Note (Signed)
Integrated Behavioral Health Visit via Telemedicine (Telephone)  01/16/2019 JERYN BERTONI [id number]   Session Start time: 11:30  Session End time: 12:00 Total time: 30 minutes  Type of Visit: Telephonic Patient location: Home [facility] Provider location: Remote All persons participating in visit: [facility] and patient  Confirmed patient's address: Yes  Confirmed patient's phone number: Yes  Any changes to demographics: No   Discussed confidentiality: Yes    The following statements were read to the patient and/or legal guardian that are established with the [facility] Provider.  "The purpose of this phone visit is to provide behavioral health care while limiting exposure to the coronavirus (COVID19).  There is a possibility of technology failure and discussed alternative modes of communication if that failure occurs."  "By engaging in this telephone visit, you consent to the provision of healthcare.  Additionally, you authorize for your insurance to be billed for the services provided during this telephone visit."   Patient and/or legal guardian consented to telephone visit: Yes   PRESENTING CONCERNS: Patient and/or family reports the following symptoms/concerns: interpersonal issues, anxiety, sadness, and loneliness.  Duration of problem: since January; Severity of problem: mild  STRENGTHS (Protective Factors/Coping Skills): Walking  GOALS ADDRESSED: Patient will: 1.  Reduce symptoms of: agitation, anxiety, depression and stress  2.  Increase knowledge and/or ability of: coping skills, healthy habits and stress reduction  3.  Demonstrate ability to: Increase healthy adjustment to current life circumstances and Increase adequate support systems for patient/family  INTERVENTIONS: Interventions utilized:  Solution-Focused Strategies, Brief CBT and Supportive Counseling Standardized Assessments completed: assessed for SI, HI, and self-harm.  ASSESSMENT: Patient currently  experiencing continued interpersonal relationship issues. Patient reported she feels used by others.  Patient is frustrated by her interactions and dynamics with her adult children. Patient processed ways to establish healthy limits with her family. Patient struggles with "I should have", and can benefit from challenging those all-or-nothing thought patterns. Patient continues to miss companionship and coping with loneliness.   Patient may benefit from bi-weekly counseling.  PLAN: 1. Follow up with behavioral health clinician on : one week via phone.   Dessie Coma, [facility], [place]

## 2019-01-17 ENCOUNTER — Other Ambulatory Visit: Payer: Self-pay

## 2019-01-17 NOTE — Progress Notes (Signed)
EPIC Encounter for ICM Monitoring  Patient Name: Susan Peck is a 53 y.o. female Date: 01/17/2019 Primary Care Physican: Harvie Heck, MD Primary Cardiologist:Bensimhon Electrophysiologist:Klein Bi-V Pacing:99.4% 3/16/2020Weight:181 lbs 12/09/2018 Weight:unknown  Transmission reviewed.   Optivol Thoracic impedanceabnormal suggesting fluid accumulation since 11/30/2018.  Prescribed dosage:Furosemide40 mg take 2 tablets (80 mg total) every morning and 1 tablet (40 mg total) every evening.  Labs: 08/19/2018 Creatinine 0.64, BUN 16, Potassium 4.1, Sodium 144, GFR 103-109 02/20/2018 Creatinine 0.59, BUN 19, Potassium 3.7, Sodium 142 09/25/2017 Creatinine 1.07, BUN 20, Potassium 4.2, Sodium 136, GFR 69.27  Recommendations:None  Follow-up plan: ICM clinic phone appointment on7/27/2020.  Copy of ICM check sent to [place].  3 month ICM trend: 01/13/2019    1 Year ICM trend:       [name], RN 01/17/2019 9:03 AM

## 2019-01-20 DIAGNOSIS — G4733 Obstructive sleep apnea (adult) (pediatric): Secondary | ICD-10-CM | POA: Diagnosis not present

## 2019-01-23 ENCOUNTER — Ambulatory Visit: Payer: Medicare Other | Admitting: Licensed Clinical Social Worker

## 2019-01-29 ENCOUNTER — Encounter: Payer: Self-pay | Admitting: Licensed Clinical Social Worker

## 2019-01-29 ENCOUNTER — Ambulatory Visit (INDEPENDENT_AMBULATORY_CARE_PROVIDER_SITE_OTHER): Payer: Medicare Other | Admitting: Licensed Clinical Social Worker

## 2019-01-29 ENCOUNTER — Other Ambulatory Visit: Payer: Self-pay

## 2019-01-29 DIAGNOSIS — F4322 Adjustment disorder with anxiety: Secondary | ICD-10-CM

## 2019-01-29 NOTE — BH Specialist Note (Signed)
Integrated Behavioral Health Visit via Telemedicine (Telephone)  01/29/2019 Susan Peck [id number]   Session Start time: 11;05   Session End time: 11:30 Total time: 25 minutes  Type of Visit: Telephonic Patient location: Home [facility] Provider location: Remote All persons participating in visit:   Confirmed patient's address: Yes  Confirmed patient's phone number: Yes  Any changes to demographics: No   Discussed confidentiality: Yes    The following statements were read to the patient and/or legal guardian that are established with the [facility] Provider.  "The purpose of this phone visit is to provide behavioral health care while limiting exposure to the coronavirus (COVID19).  There is a possibility of technology failure and discussed alternative modes of communication if that failure occurs."  "By engaging in this telephone visit, you consent to the provision of healthcare.  Additionally, you authorize for your insurance to be billed for the services provided during this telephone visit."   Patient and/or legal guardian consented to telephone visit: Yes   PRESENTING CONCERNS: Patient and/or family reports the following symptoms/concerns: interpersonal relationship issues, loneliness, mild anxiety, and sadness.  Duration of problem: since January; Severity of problem: mild  STRENGTHS (Protective Factors/Coping Skills): walking  GOALS ADDRESSED: Patient will: 1.  Reduce symptoms of: agitation, anxiety and stress  2.  Increase knowledge and/or ability of: coping skills, healthy habits, stress reduction and healthy suppots.  3.  Demonstrate ability to: Increase healthy adjustment to current life circumstances and Increase adequate support systems for patient/family  INTERVENTIONS: Interventions utilized:  Motivational Interviewing, Brief CBT and Supportive Counseling Standardized Assessments completed: Not Needed  ASSESSMENT: Patient currently experiencing mild  anxiety and sadness. Patient reported she is feeling better because she is working on improving her self-care. Patient is focusing on herself, and she reported this is helping her feel more empowered. Patient is continuing to talk to her the man that is incarcerated, but reported this is a healthy friendship for her.    Patient may benefit from outpatient counseling.   PLAN: 1. Follow up with behavioral health clinician on : one week via phone.  Dessie Coma, [facility], [place]

## 2019-02-05 ENCOUNTER — Ambulatory Visit (INDEPENDENT_AMBULATORY_CARE_PROVIDER_SITE_OTHER): Payer: Medicare Other | Admitting: Licensed Clinical Social Worker

## 2019-02-05 ENCOUNTER — Encounter: Payer: Self-pay | Admitting: Licensed Clinical Social Worker

## 2019-02-05 DIAGNOSIS — F4322 Adjustment disorder with anxiety: Secondary | ICD-10-CM

## 2019-02-05 NOTE — BH Specialist Note (Signed)
Integrated Behavioral Health Visit via Telemedicine (Telephone)  02/05/2019 RUCHY WILDRICK [id number]   Session Start time: 11:10  Session End time: 11:40 Total time: 30 minutes  Type of Visit: Telephonic Patient location: Home [facility] Provider location: Remote All persons participating in visit: patient and [facility]  Confirmed patient's address: Yes  Confirmed patient's phone number: Yes  Any changes to demographics: No   Discussed confidentiality: Yes    The following statements were read to the patient and/or legal guardian that are established with the [facility] Provider.  "The purpose of this phone visit is to provide behavioral health care while limiting exposure to the coronavirus (COVID19).  There is a possibility of technology failure and discussed alternative modes of communication if that failure occurs."  "By engaging in this telephone visit, you consent to the provision of healthcare.  Additionally, you authorize for your insurance to be billed for the services provided during this telephone visit."   Patient and/or legal guardian consented to telephone visit: Yes   PRESENTING CONCERNS: Patient and/or family reports the following symptoms/concerns: mild anxiety, sadness, loneliness, and interpersonal relationship issues.  Duration of problem: since January; Severity of problem: mild  STRENGTHS (Protective Factors/Coping Skills): Continuing to walk everyday and trying to find healthy projects.   GOALS ADDRESSED: Patient will: 1.  Reduce symptoms of: agitation, anxiety, depression and stress  2.  Increase knowledge and/or ability of: coping skills, healthy habits, self-management skills and stress reduction  3.  Demonstrate ability to: Increase healthy adjustment to current life circumstances and Increase adequate support systems for patient/family  INTERVENTIONS: Interventions utilized:  Behavioral Activation, Brief CBT and Supportive Counseling Standardized  Assessments completed: assessed for SI, HI, and self-harm.  ASSESSMENT: Patient currently experiencing mild anxiety and loneliness. Patient is proud that she is focusing on herself and improving her health. Patient is walking daily and trying to focus on eating healthier. Patient is trying to find housing projects. Patient identified that she needs to rest more because she is trying to pay attention to her body. Patient has changed her mindset about the COVID restrictions, and now she is not angry about the isolation. Patient is trying focus on home improvements and increasing self-care.   Patient may benefit from bi-weekly outpatient therapy.  PLAN: 1. Follow up with behavioral health clinician on : two weeks.   Dessie Coma, [facility], [place]

## 2019-02-12 ENCOUNTER — Other Ambulatory Visit (HOSPITAL_COMMUNITY): Payer: Self-pay | Admitting: Cardiology

## 2019-02-17 ENCOUNTER — Ambulatory Visit (INDEPENDENT_AMBULATORY_CARE_PROVIDER_SITE_OTHER): Payer: Medicare Other

## 2019-02-17 DIAGNOSIS — Z95810 Presence of automatic (implantable) cardiac defibrillator: Secondary | ICD-10-CM

## 2019-02-17 DIAGNOSIS — I5022 Chronic systolic (congestive) heart failure: Secondary | ICD-10-CM

## 2019-02-17 NOTE — Progress Notes (Signed)
EPIC Encounter for ICM Monitoring  Patient Name: Susan Peck is a 53 y.o. female Date: 02/17/2019 Primary Care Physican: Harvie Heck, MD Primary Cardiologist:Bensimhon Electrophysiologist:Klein Bi-V Pacing:98.4% Weight: unknown  Attempted call to patient and unable to reach.  Left message to return call. Transmission reviewed.   OptivolThoracic impedancesuggesting possible ongoing fluid accumulation since 02/11/2019.  Prescribed dosage:Furosemide40 mg take 2 tablets (80 mg total) every morning and 1 tablet (40 mg total) every evening.  Labs: 08/19/2018 Creatinine 0.64, BUN 16, Potassium 4.1, Sodium 144, GFR 103-109  Recommendations:Unable to reach  Follow-up plan: ICM clinic phone appointment on8/10/2018 to recheck fluid levels.  Copy of ICM check sent to Dr.Klein and Dr Haroldine Laws.  3 month ICM trend: 02/17/2019    1 Year ICM trend:       [name], RN 02/17/2019 1:40 PM

## 2019-02-19 ENCOUNTER — Encounter: Payer: Self-pay | Admitting: Licensed Clinical Social Worker

## 2019-02-19 ENCOUNTER — Ambulatory Visit (INDEPENDENT_AMBULATORY_CARE_PROVIDER_SITE_OTHER): Payer: Medicare Other | Admitting: Licensed Clinical Social Worker

## 2019-02-19 ENCOUNTER — Other Ambulatory Visit: Payer: Self-pay

## 2019-02-19 DIAGNOSIS — F4322 Adjustment disorder with anxiety: Secondary | ICD-10-CM

## 2019-02-19 NOTE — BH Specialist Note (Signed)
Integrated Behavioral Health Visit via Telemedicine (Telephone)  02/19/2019 Susan Peck [id number]   Session Start time: 11:30  Session End time: 12:00 Total time: 30 minutes  Type of Visit: Telephonic Patient location: Home [facility] Provider location: Remote All persons participating in visit: Patient and [facility]  Confirmed patient's address: Yes  Confirmed patient's phone number: Yes  Any changes to demographics: No   Discussed confidentiality: Yes    The following statements were read to the patient and/or legal guardian that are established with the [facility] Provider.  "The purpose of this phone visit is to provide behavioral health care while limiting exposure to the coronavirus (COVID19).  There is a possibility of technology failure and discussed alternative modes of communication if that failure occurs."  "By engaging in this telephone visit, you consent to the provision of healthcare.  Additionally, you authorize for your insurance to be billed for the services provided during this telephone visit."   Patient and/or legal guardian consented to telephone visit: Yes   PRESENTING CONCERNS: Patient and/or family reports the following symptoms/concerns: loneliness, anxiety, and sadness.  Duration of problem: since January; Severity of problem: mild  STRENGTHS (Protective Factors/Coping Skills): Prayer and home projects  GOALS ADDRESSED: Patient will: 1.  Reduce symptoms of: agitation, anxiety, depression and stress  2.  Increase knowledge and/or ability of: coping skills, healthy habits and stress reduction  3.  Demonstrate ability to: Increase healthy adjustment to current life circumstances and Increase adequate support systems for patient/family  INTERVENTIONS: Interventions utilized:  Behavioral Activation and Supportive Counseling Standardized Assessments completed: assessed for SI, HI, and sellf-harm.  ASSESSMENT: Patient currently experiencing improvement  with coping loneliness and lack of companionship.  Patient is trying to focus on herself and her home. Patient recognized that she has been taken advantage of in the past, and is working to establish healthier boundaries with others. Patient is continuing to communicate with her friend in prison, and feels this is a positive relationship.  Patient feels God is trying to get her to practice patience and being still.  Patient is working on being content and self control.   Patient may benefit from bi-weekly outpatient therapy.  PLAN: 1. Follow up with behavioral health clinician on : two weeks via phone.   Dessie Coma, [facility], [place]

## 2019-02-21 ENCOUNTER — Other Ambulatory Visit: Payer: Self-pay | Admitting: Dietician

## 2019-02-21 ENCOUNTER — Ambulatory Visit: Payer: Medicare Other | Admitting: Dietician

## 2019-02-21 ENCOUNTER — Telehealth: Payer: Self-pay | Admitting: Dietician

## 2019-02-21 DIAGNOSIS — E1165 Type 2 diabetes mellitus with hyperglycemia: Secondary | ICD-10-CM

## 2019-02-21 MED ORDER — FLUTICASONE PROPIONATE 50 MCG/ACT NA SUSP: 1.0000 | Freq: Two times a day (BID) | NASAL | 1 refills | Status: DC

## 2019-02-21 NOTE — Patient Instructions (Signed)
Hi Susan Peck,  Here is the plan you made today to lower your blood sugars:   1- Eat more- protein and fiber veggies; salad, fish, beans, less carbs (fruit and bread), and  smaller portions  2-  Drink more water,  Beverages without/low sugar both added and naturally occuring 3- Keep  Exercising!!  Way to go!!!  4- Make an appointment with my new doctor.   You should find the logs/jounrals in with this note.  Butch Penny [phone]

## 2019-02-21 NOTE — Progress Notes (Signed)
Diabetes Self-Management Education and Support  Visit Type:  Follow-up follow up - tried calling for our scheduled virtual appointment. Left message for a return call. Susan Peck, RD 02/21/2019 12:33 PM.           spoke with Susan Peck at a later time:   I connected with  Susan Peck on 02/21/19 by a telephone and verified that I am speaking with the correct person using two identifiers.   I discussed the limitations of evaluation and management by telemedicine. The patient expressed understanding and agreed to proceed.  Appt start time: 1145    Appt. End Time: 1155  Susan Peck, identified by name and date of birth, is a 53 y.o. female with a diagnosis of Diabetes:  Marland Kitchen  Type 2 diabetes  ASSESSMENT  This was a call to follow up on Susan Peck's Diabetes Self Management Training and support: she reports she got her meal plan card and is making changes, but blood sugars are still higher than they were and her goal  Weight:   191# ( decreased from June) Blood sugar- under 200, then goes back up again. 244 this am, higher in the mornings she thinks but plan to check it more often to be sure.  Meal planning: Trying not to eat after 7, but does sometimes...starting to drink more water,  Had been drinking store bought moderately sweet tea, juice. Does not eat much salt, mostly fresh foods, loves vegetables and fruit, whole wheat bread, pasta and beans,  rinses canned foods if she uses them.  Exercise: Walks every morning 45-60 minutes with her brother Medication for diabetes: xultophy in am- 12 units- pens say in date   Sleep- good, better now that she is walking  Plan: follow up in about 3 weeks, send blood sugar/food and activity logs, request nose spray refill. She made the following plan to lower her blood sugars: Eat more- protein and fiber veggies; salad, fish, beans, less carbs (fruit and bread), more water, smaller portions and keep  Exercising. Make an appointment with my new  doctor.    Diabetes Self-Management Education - 02/21/19 1200      Health Coping   How would you rate your overall health?  Good      Patient Education   Medications  Reviewed patients medication for diabetes, action, purpose, timing of dose and side effects.    Acute complications  Discussed and identified patients' treatment of hyperglycemia.    Psychosocial adjustment  Role of stress on diabetes;Helped patient identify a support system for diabetes management    Personal strategies to promote health  Helped patient develop diabetes management plan for (enter comment)   getting her blood sugar back under control     Individualized Goals (developed by patient)   Problem Solving  made a plan to help her lower blood sugars      Patient Self-Evaluation of Goals - Patient rates self as meeting previously set goals (% of time)   Nutrition  50 - 75 %      Outcomes   Program Status  Not Completed      Subsequent Visit   Since your last visit have you continued or begun to take your medications as prescribed?  Yes    Since your last visit have you had your blood pressure checked?  No    Since your last visit have you experienced any weight changes?  Loss    Weight Loss (lbs)  6  Since your last visit, are you checking your blood glucose at least once a day?  Yes     Future DSME appointment: - 4-6 wks 3-4 weeks per her request.  [name], RD 02/21/2019 12:33 PM.

## 2019-02-21 NOTE — Telephone Encounter (Signed)
Patient requests refill on nose spray. Intends to call the front office on Monday for an appointment with her new doctor.

## 2019-02-21 NOTE — Telephone Encounter (Signed)
Needs to reschedule because the office was closed for Covid when her appointment was scheduled. Will ask her to reschedule and have them fax [place] the report.

## 2019-02-21 NOTE — Progress Notes (Signed)
Patient was referred to me by Dessie Coma who is also seeing Ms. Kozicki.. Request referral.

## 2019-02-25 ENCOUNTER — Ambulatory Visit (INDEPENDENT_AMBULATORY_CARE_PROVIDER_SITE_OTHER): Payer: Medicare Other

## 2019-02-25 DIAGNOSIS — Z95810 Presence of automatic (implantable) cardiac defibrillator: Secondary | ICD-10-CM

## 2019-02-25 DIAGNOSIS — I5022 Chronic systolic (congestive) heart failure: Secondary | ICD-10-CM

## 2019-02-26 NOTE — Progress Notes (Signed)
EPIC Encounter for ICM Monitoring  Patient Name: LYNNEA VANDERVOORT is a 53 y.o. female Date: 02/26/2019 Primary Care Physican: Harvie Heck, MD Primary Cardiologist:Bensimhon Electrophysiologist:Klein Bi-V Pacing:98.1% Weight: 191 - 195 lbs  Spoke with patient. She said she thinks she has fluid because she has gotten off track with taking Furosemide.  She said she got depressed and stopped taking all of her meds.  She has started taking all of her meds again and is getting help with depression.  She is feeling so much better.  Weight fluctuates 3-5 lbs.  OptivolThoracic impedancesuggesting possible ongoing fluid accumulation since 02/11/2019.  Prescribed dosage:Furosemide40 mg take 2 tablets (80 mg total) every morning and 1 tablet (40 mg total) every evening.  Labs: 08/19/2018 Creatinine 0.64, BUN 16, Potassium 4.1, Sodium 144, GFR 103-109  Recommendations:She will take extra Furosemide for a couple of days.    Follow-up plan: ICM clinic phone appointment on8/05/2019 to recheck fluid levels.  Copy of ICM check sent to Dr.Klein and Dr Haroldine Laws.   3 month ICM trend: 02/25/2019    1 Year ICM trend:       [name], RN 02/26/2019 1:30 PM

## 2019-03-04 ENCOUNTER — Other Ambulatory Visit (HOSPITAL_COMMUNITY): Payer: Self-pay | Admitting: Cardiology

## 2019-03-04 MED ORDER — CARVEDILOL 25 MG PO TABS: 25.0000 mg | ORAL_TABLET | Freq: Two times a day (BID) | ORAL | 11 refills | Status: DC

## 2019-03-05 ENCOUNTER — Ambulatory Visit (INDEPENDENT_AMBULATORY_CARE_PROVIDER_SITE_OTHER): Payer: Medicare Other | Admitting: Licensed Clinical Social Worker

## 2019-03-05 ENCOUNTER — Encounter: Payer: Self-pay | Admitting: Licensed Clinical Social Worker

## 2019-03-05 DIAGNOSIS — F4322 Adjustment disorder with anxiety: Secondary | ICD-10-CM

## 2019-03-05 NOTE — BH Specialist Note (Signed)
Integrated Behavioral Health Visit via Telemedicine (Telephone)  03/05/2019 Susan Peck [id number]   Session Start time: 11:35  Session End time: 12:00 Total time: 25 minutes  Type of Visit: Telephonic Patient location: [facility] Provider location: Remote All persons participating in visit: patient and [facility]  Confirmed patient's address: Yes  Confirmed patient's phone number: Yes  Any changes to demographics: No   Discussed confidentiality: Yes    The following statements were read to the patient and/or legal guardian that are established with the [facility] Provider.  "The purpose of this phone visit is to provide behavioral health care while limiting exposure to the coronavirus (COVID19).  There is a possibility of technology failure and discussed alternative modes of communication if that failure occurs."  "By engaging in this telephone visit, you consent to the provision of healthcare.  Additionally, you authorize for your insurance to be billed for the services provided during this telephone visit."   Patient and/or legal guardian consented to telephone visit: Yes   PRESENTING CONCERNS: Patient and/or family reports the following symptoms/concerns: loneliness, anxiety, and sadness.  Duration of problem: since January; Severity of problem: mild  STRENGTHS (Protective Factors/Coping Skills): Home improvement projects.  GOALS ADDRESSED: Patient will: 1.  Reduce symptoms of: anxiety, depression and stress  2.  Increase knowledge and/or ability of: coping skills and healthy habits  3.  Demonstrate ability to: Increase healthy adjustment to current life circumstances and Increase adequate support systems for patient/family  INTERVENTIONS: Interventions utilized:  Motivational Interviewing, Brief CBT and Supportive Counseling Standardized Assessments completed: Not Needed  ASSESSMENT: Patient currently experiencing mild anxiety, irritability, and sadness. Patient  has made improvements with dealing with loneliness and building her independence. Patient is empowered that she can take care of herself, and does not need a partner to assist her. Patient processed changes that she has made in her life since January. Patient is trying not to allow others to use her, and she is working to establish limits with others.  Patient processed how she was unhappy for many years due to putting others needs before hers.   Patient may benefit from outpatient therapy.  PLAN: 1. Follow up with behavioral health clinician on : three weeks. Dessie Coma, [facility], [place]

## 2019-03-07 ENCOUNTER — Other Ambulatory Visit: Payer: Self-pay

## 2019-03-14 NOTE — Progress Notes (Signed)
No ICM remote transmission received for 03/03/2019 and next ICM transmission scheduled for 03/25/2019.

## 2019-03-21 ENCOUNTER — Encounter: Payer: Self-pay | Admitting: Dietician

## 2019-03-21 ENCOUNTER — Telehealth: Payer: Self-pay | Admitting: Dietician

## 2019-03-21 NOTE — Telephone Encounter (Addendum)
This was a call to follow up to our appointment on 02/21/19.    Lana asked or assistance in scheduling her eye appointment with  Dr. Alois Cliche   She reports: . Bowels are getting better. Eating more- protein; fiber veggies 2x/day, fruit x 3 /week,  Drinking more water, unsweetened tea- tea. regular soda much less often.    Exercising. 5 days a week- 30-45 minutes a day- Checks sugar once a day- has seen a great improvement from the changes she's made:  [phone] 136 Medicines: Taking pills twice a day better- but thinks a note to remind herself might help the remember the second doses Has a scale-weighs almost daily- 184#- limits fluids  Would like help figuring out why she has pain in her back and legs; has goals and support persons in place to help with barriers when they occur. One goal is to minimize medications. She agreed to call to make an appointment with her new doctor next week. [name], RD 03/21/2019 10:54 AM.

## 2019-03-25 ENCOUNTER — Ambulatory Visit (INDEPENDENT_AMBULATORY_CARE_PROVIDER_SITE_OTHER): Payer: Medicare Other

## 2019-03-25 DIAGNOSIS — I5022 Chronic systolic (congestive) heart failure: Secondary | ICD-10-CM

## 2019-03-25 DIAGNOSIS — Z95810 Presence of automatic (implantable) cardiac defibrillator: Secondary | ICD-10-CM

## 2019-03-28 ENCOUNTER — Telehealth: Payer: Self-pay

## 2019-03-28 NOTE — Telephone Encounter (Signed)
ICM call to patient.  During conversation she describes a "thumping sensation" that makes her body jerk.  She reports device parameters were changed during OV with Dr Caryl Comes 08/19/2018 to help with the "thumping feeling".  She is asking if device parameters can be changed since it is uncomfortable and interferes with sleep.  Dr Olin Pia 08/19/2018 OV notes says the device was reprogrammed to decrease the likelihood of diaphragmatic stimulation going from 2-3 to 1> coil.  Advised would have device clinic to call her.  She says it is not urgent.  Phone note sent to device clinic triage for follow up.

## 2019-03-28 NOTE — Telephone Encounter (Signed)
Reports intermittent "thumping " sensation in left side when she lays on that side. Patient will be seen in DC 04/03/19 at 1100 am for evaluation for STIM.

## 2019-03-28 NOTE — Progress Notes (Signed)
EPIC Encounter for ICM Monitoring  Patient Name: MURIAL BEDROSIAN is a 53 y.o. female Date: 03/28/2019 Primary Care Physican: Harvie Heck, MD Primary Cardiologist:Bensimhon Electrophysiologist:Klein Bi-V Pacing:99.2% Weight: 191 - 195 lbs  Spoke with patient.  She reports her body jerking and heart thumping.  She says it is uncomfortable and interferes with sleep.  She is taking all medication.    OptivolThoracic impedanceshows pattern of a few days of possible fluid accumulation followed by a few days at baseline.  Prescribed dosage:Furosemide40 mg take 2 tablets (80 mg total) every morning and 1 tablet (40 mg total) every evening.  Recommendations:  Phone note sent to device triage regarding device parameters. Encouraged to call if experiencing any fluid symptoms.  Follow-up plan: ICM clinic phone appointment on 05/19/2019.   91 day device clinic remote transmission 04/07/2019.      Copy of ICM check sent to Dr. Caryl Comes.   3 month ICM trend: 03/25/2019    1 Year ICM trend:       [name], RN 03/28/2019 10:47 AM

## 2019-04-03 ENCOUNTER — Other Ambulatory Visit: Payer: Self-pay

## 2019-04-03 NOTE — Telephone Encounter (Signed)
  Patient is calling because she had an appt with device clinic at 11am today and she states she was at the office and checked in downstairs, waited for almost 30 mins to be called and then was told device clinic saw another patient in her place and she cannot be seen until after 12. She is very upset and would like a call.

## 2019-04-03 NOTE — Telephone Encounter (Signed)
Spoke to the patient and apologized for the mis communication today due to her not being arrived when she came in for her appointment.  After explaining all to her she was ok but is c/o pain at the device site and "thumping" sensation below her ribs on the same side as device.  She does feel like she needs to be seen.  I have rescheduled her appointment to tomorrow with the EP APP.  Shana aware.

## 2019-04-03 NOTE — Telephone Encounter (Signed)
See MyChart message encounter from 04/03/19.

## 2019-04-04 ENCOUNTER — Other Ambulatory Visit: Payer: Self-pay

## 2019-04-04 ENCOUNTER — Ambulatory Visit (INDEPENDENT_AMBULATORY_CARE_PROVIDER_SITE_OTHER): Payer: Medicare Other | Admitting: Student

## 2019-04-04 DIAGNOSIS — I5022 Chronic systolic (congestive) heart failure: Secondary | ICD-10-CM | POA: Diagnosis not present

## 2019-04-04 LAB — CUP PACEART INCLINIC DEVICE CHECK
Battery Remaining Longevity: 64 mo
Battery Voltage: 2.97 V
Brady Statistic AP VP Percent: 0.43 %
Brady Statistic AP VS Percent: 0.01 %
Brady Statistic AS VP Percent: 99.44 %
Brady Statistic AS VS Percent: 0.11 %
Brady Statistic RA Percent Paced: 0.44 %
Brady Statistic RV Percent Paced: 99.22 %
Date Time Interrogation Session: 20200911144513
HighPow Impedance: 64 Ohm
Implantable Lead Implant Date: 20180524
Implantable Lead Implant Date: 20180524
Implantable Lead Implant Date: 20180524
Implantable Lead Location: 753858
Implantable Lead Location: 753859
Implantable Lead Location: 753860
Implantable Lead Model: 4398
Implantable Lead Model: 5076
Implantable Pulse Generator Implant Date: 20180524
Lead Channel Impedance Value: 145.871
Lead Channel Impedance Value: 153.152
Lead Channel Impedance Value: 159.6 Ohm
Lead Channel Impedance Value: 178.5 Ohm
Lead Channel Impedance Value: 189.525
Lead Channel Impedance Value: 266 Ohm
Lead Channel Impedance Value: 323 Ohm
Lead Channel Impedance Value: 361 Ohm
Lead Channel Impedance Value: 399 Ohm
Lead Channel Impedance Value: 399 Ohm
Lead Channel Impedance Value: 399 Ohm
Lead Channel Impedance Value: 418 Ohm
Lead Channel Impedance Value: 418 Ohm
Lead Channel Impedance Value: 513 Ohm
Lead Channel Impedance Value: 513 Ohm
Lead Channel Impedance Value: 570 Ohm
Lead Channel Impedance Value: 608 Ohm
Lead Channel Impedance Value: 627 Ohm
Lead Channel Pacing Threshold Amplitude: 0.5 V
Lead Channel Pacing Threshold Amplitude: 0.625 V
Lead Channel Pacing Threshold Amplitude: 0.75 V
Lead Channel Pacing Threshold Pulse Width: 0.4 ms
Lead Channel Pacing Threshold Pulse Width: 0.4 ms
Lead Channel Pacing Threshold Pulse Width: 1 ms
Lead Channel Sensing Intrinsic Amplitude: 17.625 mV
Lead Channel Sensing Intrinsic Amplitude: 17.625 mV
Lead Channel Sensing Intrinsic Amplitude: 3.625 mV
Lead Channel Sensing Intrinsic Amplitude: 3.625 mV
Lead Channel Setting Pacing Amplitude: 0.75 V
Lead Channel Setting Pacing Amplitude: 1.5 V
Lead Channel Setting Pacing Amplitude: 2.5 V
Lead Channel Setting Pacing Pulse Width: 0.4 ms
Lead Channel Setting Pacing Pulse Width: 1 ms
Lead Channel Setting Sensing Sensitivity: 0.3 mV

## 2019-04-04 NOTE — Progress Notes (Signed)
Seen in office for diaphragmatic stim. LV1- LV3.  Seen/felt sitting down to ~3.0- 2.75 V. She feels at night while lying down.      Pt stim free LV2-LV3. Vectors with LV1 or LV4 were able to reproduce stim at high voltages, or greatly decreased battery life.     Will decrease amplitude to 0.75 V @ 1.0 ms for now, and leave at LV1-LV3.  If stim continues, will need to be programmed under EKG to LV2-LV3 to make sure still getting sufficient benefit from CRT

## 2019-04-07 ENCOUNTER — Ambulatory Visit (INDEPENDENT_AMBULATORY_CARE_PROVIDER_SITE_OTHER): Payer: Medicare Other | Admitting: *Deleted

## 2019-04-07 DIAGNOSIS — I428 Other cardiomyopathies: Secondary | ICD-10-CM | POA: Diagnosis not present

## 2019-04-10 DIAGNOSIS — E113291 Type 2 diabetes mellitus with mild nonproliferative diabetic retinopathy without macular edema, right eye: Secondary | ICD-10-CM | POA: Diagnosis not present

## 2019-04-10 LAB — HM DIABETES EYE EXAM

## 2019-04-11 LAB — HM DIABETES EYE EXAM

## 2019-04-12 LAB — CUP PACEART REMOTE DEVICE CHECK
Battery Remaining Longevity: 64 mo
Battery Voltage: 2.97 V
Brady Statistic AP VP Percent: 0.72 %
Brady Statistic AP VS Percent: 0.01 %
Brady Statistic AS VP Percent: 99.22 %
Brady Statistic AS VS Percent: 0.05 %
Brady Statistic RA Percent Paced: 0.73 %
Brady Statistic RV Percent Paced: 99.63 %
Date Time Interrogation Session: 20200919062604
HighPow Impedance: 67 Ohm
Implantable Lead Implant Date: 20180524
Implantable Lead Implant Date: 20180524
Implantable Lead Implant Date: 20180524
Implantable Lead Location: 753858
Implantable Lead Location: 753859
Implantable Lead Location: 753860
Implantable Lead Model: 4398
Implantable Lead Model: 5076
Implantable Pulse Generator Implant Date: 20180524
Lead Channel Impedance Value: 165.029
Lead Channel Impedance Value: 165.029
Lead Channel Impedance Value: 165.029
Lead Channel Impedance Value: 180.5 Ohm
Lead Channel Impedance Value: 180.5 Ohm
Lead Channel Impedance Value: 304 Ohm
Lead Channel Impedance Value: 361 Ohm
Lead Channel Impedance Value: 361 Ohm
Lead Channel Impedance Value: 361 Ohm
Lead Channel Impedance Value: 399 Ohm
Lead Channel Impedance Value: 418 Ohm
Lead Channel Impedance Value: 456 Ohm
Lead Channel Impedance Value: 532 Ohm
Lead Channel Impedance Value: 532 Ohm
Lead Channel Impedance Value: 570 Ohm
Lead Channel Impedance Value: 608 Ohm
Lead Channel Impedance Value: 627 Ohm
Lead Channel Impedance Value: 665 Ohm
Lead Channel Pacing Threshold Amplitude: 0.5 V
Lead Channel Pacing Threshold Amplitude: 0.5 V
Lead Channel Pacing Threshold Amplitude: 0.625 V
Lead Channel Pacing Threshold Pulse Width: 0.4 ms
Lead Channel Pacing Threshold Pulse Width: 0.4 ms
Lead Channel Pacing Threshold Pulse Width: 1 ms
Lead Channel Sensing Intrinsic Amplitude: 19.875 mV
Lead Channel Sensing Intrinsic Amplitude: 19.875 mV
Lead Channel Sensing Intrinsic Amplitude: 2.75 mV
Lead Channel Sensing Intrinsic Amplitude: 2.75 mV
Lead Channel Setting Pacing Amplitude: 0.75 V
Lead Channel Setting Pacing Amplitude: 1.5 V
Lead Channel Setting Pacing Amplitude: 2.5 V
Lead Channel Setting Pacing Pulse Width: 0.4 ms
Lead Channel Setting Pacing Pulse Width: 1 ms
Lead Channel Setting Sensing Sensitivity: 0.3 mV

## 2019-04-15 ENCOUNTER — Other Ambulatory Visit: Payer: Self-pay

## 2019-04-15 ENCOUNTER — Encounter: Payer: Self-pay | Admitting: Internal Medicine

## 2019-04-15 ENCOUNTER — Ambulatory Visit (INDEPENDENT_AMBULATORY_CARE_PROVIDER_SITE_OTHER): Payer: Medicare Other | Admitting: Internal Medicine

## 2019-04-15 VITALS — BP 126/81 | HR 74 | Temp 98.3°F | Ht 64.0 in | Wt 191.1 lb

## 2019-04-15 DIAGNOSIS — M545 Low back pain: Secondary | ICD-10-CM | POA: Diagnosis not present

## 2019-04-15 DIAGNOSIS — Z9989 Dependence on other enabling machines and devices: Secondary | ICD-10-CM

## 2019-04-15 DIAGNOSIS — Z95810 Presence of automatic (implantable) cardiac defibrillator: Secondary | ICD-10-CM

## 2019-04-15 DIAGNOSIS — I110 Hypertensive heart disease with heart failure: Secondary | ICD-10-CM

## 2019-04-15 DIAGNOSIS — E049 Nontoxic goiter, unspecified: Secondary | ICD-10-CM

## 2019-04-15 DIAGNOSIS — I10 Essential (primary) hypertension: Secondary | ICD-10-CM

## 2019-04-15 DIAGNOSIS — E118 Type 2 diabetes mellitus with unspecified complications: Secondary | ICD-10-CM | POA: Diagnosis not present

## 2019-04-15 DIAGNOSIS — I5022 Chronic systolic (congestive) heart failure: Secondary | ICD-10-CM

## 2019-04-15 DIAGNOSIS — J4530 Mild persistent asthma, uncomplicated: Secondary | ICD-10-CM

## 2019-04-15 DIAGNOSIS — G2581 Restless legs syndrome: Secondary | ICD-10-CM | POA: Diagnosis not present

## 2019-04-15 DIAGNOSIS — E1165 Type 2 diabetes mellitus with hyperglycemia: Secondary | ICD-10-CM

## 2019-04-15 DIAGNOSIS — Z0000 Encounter for general adult medical examination without abnormal findings: Secondary | ICD-10-CM

## 2019-04-15 DIAGNOSIS — E785 Hyperlipidemia, unspecified: Secondary | ICD-10-CM

## 2019-04-15 DIAGNOSIS — F329 Major depressive disorder, single episode, unspecified: Secondary | ICD-10-CM

## 2019-04-15 DIAGNOSIS — Z79899 Other long term (current) drug therapy: Secondary | ICD-10-CM

## 2019-04-15 DIAGNOSIS — G4733 Obstructive sleep apnea (adult) (pediatric): Secondary | ICD-10-CM

## 2019-04-15 DIAGNOSIS — G8929 Other chronic pain: Secondary | ICD-10-CM

## 2019-04-15 DIAGNOSIS — E119 Type 2 diabetes mellitus without complications: Secondary | ICD-10-CM | POA: Diagnosis not present

## 2019-04-15 DIAGNOSIS — G4700 Insomnia, unspecified: Secondary | ICD-10-CM

## 2019-04-15 DIAGNOSIS — Z794 Long term (current) use of insulin: Secondary | ICD-10-CM

## 2019-04-15 LAB — POCT GLYCOSYLATED HEMOGLOBIN (HGB A1C): Hemoglobin A1C: 8.3 % — AB (ref 4.0–5.6)

## 2019-04-15 LAB — GLUCOSE, CAPILLARY: Glucose-Capillary: 80 mg/dL (ref 70–99)

## 2019-04-15 MED ORDER — ROPINIROLE HCL 1 MG PO TABS: 3.0000 mg | ORAL_TABLET | Freq: Every evening | ORAL | 3 refills | Status: DC

## 2019-04-15 NOTE — Patient Instructions (Addendum)
Susan Peck,  It was a pleasure seeing you in clinic. Today we discussed your chronic leg, back and arm pain. At this time, I will increase your ropinirole.  I am also checking lab work and will contact you with the results and any changes to your medications.  Please contact us if you have any concerns.  Thank you!

## 2019-04-15 NOTE — Progress Notes (Signed)
CC: leg and back pain   HPI:  [name] is a 53 y.o. female with PMHX as listed below presenting with complaints of worsening chronic leg and back pain which are making it dificult for her to sleep. Patient reports that she is having lower back pain radiating to bilateral legs and is also experiencing right shoulder and arm pain which she attributes to a fall several years ago. She reports that the pain is worse at night when she is in her bed and improves with her getting up and walking around. Pain was previously controlled with ropinirole 2mg  but that has stopped working recently. Please see problem based charting for further assessment and planning of her back and leg pain and other chronic issues.   Past Medical History:  Diagnosis Date  . AICD (automatic cardioverter/defibrillator) present 12/13/2016  . Anxiety    no meds  . Asthma   . CHF (congestive heart failure) ([place])   . Depression    no meds  . Diabetes mellitus    recent dx 11/17/11 - started med 11/18/11 type 2  . GERD (gastroesophageal reflux disease)   . Goiter 07/27/2011   non-neoplastic goiter - fine needle aspiration - benign sees dr Dwyane Dee for  . Heart murmur    dx 2 yrs ago per pt  . Herpes genitalis in women   . Hypertension   . IBS (irritable bowel syndrome)    tx with diet per pt  . Low back pain    history  . Obesity   . Seasonal allergies   . Shortness of breath    occasional - exercise induced  . Sleep apnea    cpap broken  . Systolic heart failure    May 2011 EF 35-40%, 04/03/11 EF 25-30%, 06/27/11 EF 30-35%   Review of Systems:  Review of Systems  Constitutional: Negative for chills, fever and malaise/fatigue.  HENT: Negative.   Eyes: Negative for blurred vision, double vision and photophobia.  Respiratory: Negative for cough, shortness of breath and wheezing.   Cardiovascular: Negative for chest pain, palpitations, claudication and leg swelling.  Gastrointestinal: Negative for abdominal  pain, constipation, diarrhea, nausea and vomiting.  Genitourinary: Negative for dysuria, frequency and urgency.  Musculoskeletal: Positive for back pain and myalgias. Negative for falls and joint pain.  Skin: Negative for itching and rash.  Neurological: Negative for dizziness, tingling, sensory change, focal weakness, weakness and headaches.  Endo/Heme/Allergies: Negative.   Psychiatric/Behavioral: The patient has insomnia.     Physical Exam:  Vitals:   04/15/19 1543  BP: 126/81  Pulse: 74  Temp: 98.3 F (36.8 C)  TempSrc: Oral  SpO2: 100%  Weight: 191 lb 1.6 oz (86.7 kg)  Height: 5\' 4"  (1.626 m)   Physical Exam Constitutional:      General: She is not in acute distress.    Appearance: Normal appearance. She is not ill-appearing.  Neck:     Musculoskeletal: Normal range of motion and neck supple. No neck rigidity or muscular tenderness.  Cardiovascular:     Rate and Rhythm: Normal rate and regular rhythm.     Pulses: Normal pulses.     Heart sounds: Normal heart sounds. No murmur. No friction rub. No gallop.   Pulmonary:     Effort: Pulmonary effort is normal. No respiratory distress.     Breath sounds: Normal breath sounds. No wheezing or rales.  Abdominal:     General: Bowel sounds are normal. There is no distension.  Palpations: Abdomen is soft.     Tenderness: There is no abdominal tenderness. There is no guarding.  Musculoskeletal: Normal range of motion.        General: No swelling, deformity or signs of injury.  Lymphadenopathy:     Cervical: No cervical adenopathy.  Skin:    General: Skin is warm and dry.     Capillary Refill: Capillary refill takes less than 2 seconds.  Neurological:     General: No focal deficit present.     Mental Status: She is alert and oriented to person, place, and time.     Sensory: No sensory deficit.     Motor: No weakness.     Coordination: Coordination normal.     Gait: Gait normal.     Assessment & Plan:   See  Encounters Tab for problem based charting.  Patient seen with Dr. Lynnae January

## 2019-04-16 LAB — LIPID PANEL
Chol/HDL Ratio: 5.3 ratio — ABNORMAL HIGH (ref 0.0–4.4)
Cholesterol, Total: 206 mg/dL — ABNORMAL HIGH (ref 100–199)
HDL: 39 mg/dL — ABNORMAL LOW (ref >39)
LDL Chol Calc (NIH): 148 mg/dL — ABNORMAL HIGH (ref 0–99)
Triglycerides: 107 mg/dL (ref 0–149)
VLDL Cholesterol Cal: 19 mg/dL (ref 5–40)

## 2019-04-16 LAB — BMP8+ANION GAP
Anion Gap: 15 mmol/L (ref 10.0–18.0)
BUN/Creatinine Ratio: 20 (ref 9–23)
BUN: 14 mg/dL (ref 6–24)
CO2: 24 mmol/L (ref 20–29)
Calcium: 9.8 mg/dL (ref 8.7–10.2)
Chloride: 101 mmol/L (ref 96–106)
Creatinine, Ser: 0.71 mg/dL (ref 0.57–1.00)
GFR calc Af Amer: 112 mL/min/{1.73_m2} (ref >59)
GFR calc non Af Amer: 98 mL/min/{1.73_m2} (ref >59)
Glucose: 91 mg/dL (ref 65–99)
Potassium: 4.1 mmol/L (ref 3.5–5.2)
Sodium: 140 mmol/L (ref 134–144)

## 2019-04-16 LAB — TSH: TSH: 1.1 u[IU]/mL (ref 0.450–4.500)

## 2019-04-17 ENCOUNTER — Other Ambulatory Visit: Payer: Self-pay | Admitting: Internal Medicine

## 2019-04-17 DIAGNOSIS — G2581 Restless legs syndrome: Secondary | ICD-10-CM

## 2019-04-17 MED ORDER — ROPINIROLE HCL 1 MG PO TABS: 3.0000 mg | ORAL_TABLET | Freq: Every day | ORAL | 3 refills | Status: DC

## 2019-04-17 MED ORDER — ATORVASTATIN CALCIUM 40 MG PO TABS: 40.0000 mg | ORAL_TABLET | Freq: Every day | ORAL | 11 refills | Status: DC

## 2019-04-17 NOTE — Assessment & Plan Note (Signed)
Per last assessment and plan, patient was to be started on Lipitor 40mg  qd. However, patient is not taking it and lipid profile at this visit: TC 206 HDL 39 LDL 148 and Triglycerides 107.  ASCVD risk: 14.1%. Patient to be restarted on high intensity statin.   - Restart Atorvastatin 40mg  qd

## 2019-04-17 NOTE — Assessment & Plan Note (Addendum)
Patient has been previously evaluated for chronic bilateral leg pain radiating down her thighs and to her toes. She was previously prescribed gabapentin which did not provide any relief. She reports that she was started on ropinirole which did result in improvement of symptoms temporarily. However, her symptoms have been worsening over the past few months. She reports that she has bilateral leg pain at night that starts from her hips and radiates down to her toes. No change in sensation. She states that the pain is relieved with getting up and walking around, as a result of which she has been unable to sleep well at night and is only getting 2-3 hours of sleep per night. She also endorses worsening back and right shoulder pain from an injury several years ago.  On examination, there is tenderness to palpation of the right shoulder but ROM is otherwise intact with passive and active movements. There isn't any point tenderness to palpation of hips, knees or calves but patient endorses generalized tenderness throughout. No motor or sensory deficits noted on examination and gait is wnl. TSH wnl.  On chart review, patient has been evaluated for her right shoulder pain and chronic low back and bilateral hip pain in the past. Lumbar x-ray in 2017 with minimal osteoarthritic changes of lower lumbar spine. CT of right shoulder in 2019 with low grade articular surface fraying of mid-posterior supraspinatus tendon fibers and small posterosuperior labral tear. Patient has been managed with conservative management, PT and chiropractic services in the past for these issues with improvement in symptoms.  At this point, patient reports worsening of her chronic symptoms. Based on her description of the pain being worse at night and alleviated with movement, will continue to treat for restless leg syndrome with increase in ropinirole to 3mg  qhs. However, pain could be due to sciatica as she endorses radiation down bilateral  thighs. If pain persists, will consider getting lumbar radiographs to assess for any changes.   - Ropinirole increased to 3mg  qHS - Continue to monitor - Lumbar radiograph if no improvement in symptoms

## 2019-04-17 NOTE — Assessment & Plan Note (Addendum)
Patient is on Lasix 80mg  qAM and 40mg  qPM. She reports that she was not taking her lasix for a while due to depression and had noticed swelling in lower extremities. However, since then, she has started taking her Lasix again and has noticed improvement in her symptoms. She denies any chest pain or shortness of breath. She was seen by EP on 9/11 for adjustment of her AICD as she was experiencing palpitations when lying on her left side. Since the adjustment, patient notes that although she can still feel some "thumping" in her chest, it has improved since prior. On examination, she does not appear to be volume overloaded.   - Continue current regimen of Lasix 80mg  qAM and 40mg  qPM, Entresto 97103mg  bid, Eplerenone 25mg  qd and Carvedilol 25mg  bid  - Follow up with EP for further adjustment of ICD if continued palpitations

## 2019-04-17 NOTE — Assessment & Plan Note (Signed)
Patient deferred flu vaccine, PNA vaccine and Tdap at this visit.  She would like to get a screening mammogram however, would like alternative modality as she is concerned about her ICD with the mammogram. Although other imaging modalities are available such as ultrasound and MRI, these are not recommended in screening for breast cancer in an average risk female. Patient is not a candidate for MRI due to the ICD.

## 2019-04-17 NOTE — Assessment & Plan Note (Signed)
BP 126/81 today. Patient reports compliance with medications. She denies any chest pain, shortness of breath, headache or dizziness. Patient encouraged to continue taking all medications as prescribed   - Continue current medication regimen of carvedilol, entresto, lasix, and eplerenone

## 2019-04-17 NOTE — Assessment & Plan Note (Signed)
Patient's HbA1c 8.3 today. She reports period of time last month when she was depressed and did not take any of her medications and was noncompliant with her diet. Patient reports since then, she has started taking her medication again regularly and is motivated to get back on track. Patient instructed to follow up with Butch Penny for diabetes nutrition counseling.   - Continue Xultophy 12U daily - Follow up with Butch Penny  - Repeat A1c in 3 months

## 2019-04-17 NOTE — Addendum Note (Signed)
Addended by: Harvie Heck on: 04/17/2019 01:49 PM   Modules accepted: Orders

## 2019-04-17 NOTE — Assessment & Plan Note (Signed)
Patient continues to use CPAP every night. She uses her albuterol inhaler as needed and reports not having used it in several weeks.   - Continue CPAP - Continue albuterol 1-2puff q6h prn  - Follow up with pulm as needed

## 2019-04-18 ENCOUNTER — Encounter: Payer: Self-pay | Admitting: Cardiology

## 2019-04-18 NOTE — Progress Notes (Signed)
Remote ICD transmission.   

## 2019-04-21 ENCOUNTER — Encounter: Payer: Self-pay | Admitting: *Deleted

## 2019-04-21 DIAGNOSIS — H40013 Open angle with borderline findings, low risk, bilateral: Secondary | ICD-10-CM | POA: Diagnosis not present

## 2019-04-21 NOTE — Progress Notes (Signed)
Internal Medicine Clinic Attending  I saw and evaluated the patient.  I personally confirmed the key portions of the history and exam documented by Dr. Marva Panda and I reviewed pertinent patient test results.  The assessment, diagnosis, and plan were formulated together and I agree with the documentation in the resident's note.   The pt's description of sxs seems to be chronic MSK pain.

## 2019-04-28 ENCOUNTER — Encounter: Payer: Self-pay | Admitting: Internal Medicine

## 2019-04-28 ENCOUNTER — Telehealth: Payer: Self-pay | Admitting: Dietician

## 2019-04-28 NOTE — Telephone Encounter (Signed)
Pt states that she was with her daughter and her daughter's fiance last Thursday then he went to hosp Sunday and he was [place].  Triage advised for her to self quarantine for 10days, stay 6 ft from anyone in the house, wear a mask, keep surfaces clean, keep hands clean, drink plenty of fluids, rest as much as she can. She is agreeable  Pt feels fine, no symptoms, please advise

## 2019-04-28 NOTE — Telephone Encounter (Signed)
Called pt as requested, no answer, lm for rtc

## 2019-04-28 NOTE — Telephone Encounter (Signed)
Says she was exposed to coronavirus. She does not have symptoms. Spoke with our nurse and doctor and said she will stay away from people and drink plenty of fluids.

## 2019-05-12 ENCOUNTER — Telehealth: Payer: Self-pay | Admitting: Dietician

## 2019-05-12 DIAGNOSIS — E1165 Type 2 diabetes mellitus with hyperglycemia: Secondary | ICD-10-CM

## 2019-05-12 MED ORDER — ONETOUCH DELICA PLUS LANCING MISC: 1.0000 | Freq: Three times a day (TID) | 2 refills | Status: DC

## 2019-05-12 NOTE — Telephone Encounter (Signed)
Her Onetouch lancing device broke, she is requesting a new one. Let her know that an order had been sent.

## 2019-05-13 ENCOUNTER — Other Ambulatory Visit (HOSPITAL_COMMUNITY): Payer: Self-pay | Admitting: Cardiology

## 2019-05-15 NOTE — Telephone Encounter (Signed)
Susan Peck called and stated she got lancets not a lancing device. I asked [facility] to order her a lancing device, gave Susan Peck the [place] toll free number to see if they will send hr one and put a sample at the front desk for her to pick up.

## 2019-05-19 ENCOUNTER — Ambulatory Visit (INDEPENDENT_AMBULATORY_CARE_PROVIDER_SITE_OTHER): Payer: Medicare Other

## 2019-05-19 DIAGNOSIS — Z95810 Presence of automatic (implantable) cardiac defibrillator: Secondary | ICD-10-CM | POA: Diagnosis not present

## 2019-05-19 DIAGNOSIS — I5022 Chronic systolic (congestive) heart failure: Secondary | ICD-10-CM | POA: Diagnosis not present

## 2019-05-20 NOTE — Progress Notes (Signed)
EPIC Encounter for ICM Monitoring  Patient Name: Susan Peck is a 53 y.o. female Date: 05/20/2019 Primary Care Physican: Harvie Heck, MD Primary Cardiologist:Bensimhon Electrophysiologist:Klein Bi-V Pacing:99.7% Weight:191 - 195 lbs  Transmission reviewed.    OptivolThoracic impedanceshows pattern of a few days of possible fluid accumulation followed by a few days at baseline.  Prescribed dosage:Furosemide40 mg take 2 tablets (80 mg total) every morning and 1 tablet (40 mg total) every evening.  Recommendations: None  Follow-up plan: ICM clinic phone appointment on 06/23/2019.   91 day device clinic remote transmission 07/07/2019.    Copy of ICM check sent to Dr. Caryl Comes.   3 month ICM Trend: 05/19/2019    1 year trend:  [name], RN 05/20/2019 4:57 PM

## 2019-05-30 ENCOUNTER — Telehealth: Payer: Self-pay | Admitting: Dietician

## 2019-05-30 DIAGNOSIS — G4733 Obstructive sleep apnea (adult) (pediatric): Secondary | ICD-10-CM | POA: Diagnosis not present

## 2019-05-30 DIAGNOSIS — E1165 Type 2 diabetes mellitus with hyperglycemia: Secondary | ICD-10-CM

## 2019-05-30 MED ORDER — INSULIN PEN NEEDLE 32G X 4 MM MISC: 5 refills | Status: DC

## 2019-05-30 NOTE — Telephone Encounter (Signed)
Contacted patient by phone.

## 2019-06-23 ENCOUNTER — Other Ambulatory Visit: Payer: Self-pay

## 2019-06-24 ENCOUNTER — Ambulatory Visit (INDEPENDENT_AMBULATORY_CARE_PROVIDER_SITE_OTHER): Payer: Medicare Other | Admitting: Women's Health

## 2019-06-24 ENCOUNTER — Encounter: Payer: Self-pay | Admitting: Women's Health

## 2019-06-24 ENCOUNTER — Telehealth: Payer: Self-pay

## 2019-06-24 VITALS — BP 128/80 | Ht 64.0 in | Wt 186.0 lb

## 2019-06-24 DIAGNOSIS — Z01419 Encounter for gynecological examination (general) (routine) without abnormal findings: Secondary | ICD-10-CM

## 2019-06-24 DIAGNOSIS — Z13820 Encounter for screening for osteoporosis: Secondary | ICD-10-CM

## 2019-06-24 NOTE — Progress Notes (Signed)
Susan Peck Feb 23, 1966 [id number]    History:    Presents for breast and pelvic exam with no GYN complaints.  Normal Pap history last mammogram 2017.  Not sexually active denies need for STD screen.  2013 TVH with BSO for chronic pelvic pain good relief of symptoms.  Biggest problems are CHF, hypertension, diabetes, COPD, asthma has an implantable defibrillator.  On disability.  2016 benign colon polyp 10-year follow-up.  Past medical history, past surgical history, family history and social history were all reviewed and documented in the EPIC chart.  2 sons and a daughter, daughter making poor choices, has 2 children both living with different families and currently pregnant.  Currently "talking" to someone in prison, nonviolent crime.  ROS:  A ROS was performed and pertinent positives and negatives are included.  Exam:  Vitals:   06/24/19 1021  BP: 128/80  Weight: 186 lb (84.4 kg)  Height: 5\' 4"  (1.626 m)   Body mass index is 31.93 kg/m.   General appearance:  Normal Thyroid:  Symmetrical, normal in size, without palpable masses or nodularity. Respiratory  Auscultation:  Clear without wheezing or rhonchi Cardiovascular  Auscultation:  Regular rate, without rubs, murmurs or gallops  Edema/varicosities:  Not grossly evident Abdominal  Soft,nontender, without masses, guarding or rebound.  Liver/spleen:  No organomegaly noted  Hernia:  None appreciated  Skin  Inspection:  Grossly normal   Breasts: Examined lying and sitting.     Right: Without masses, retractions, discharge or axillary adenopathy.     Left: Without masses, retractions, discharge or axillary adenopathy. Gentitourinary   Inguinal/mons:  Normal without inguinal adenopathy  External genitalia:  Normal  BUS/Urethra/Skene's glands:  Normal  Vagina:  Normal  Cervix: And uterus absent   Adnexa/parametria:     Rt: Without masses or tenderness.   Lt: Without masses or tenderness.  Anus and  perineum: Normal  Digital rectal exam: Normal sphincter tone without palpated masses or tenderness  Assessment/Plan:  53 y.o. DBF G7, P3 for breast and pelvic exam with no GYN complaints.  2013 TVH with BSO for chronic pelvic pain on no HRT CHF has implantable defibrillator cardiologist manages Hypertension, hypercholesteremia, diabetes, asthma COPD-primary care manages labs and meds  Obesity  Plan: Lost weight several years ago and has maintained, current short short walks, low-carb and calorie diet.  SBEs, reviewed importance of annual screen overdue instructed to schedule.  Shingrex reviewed will check with insurance.  DEXA will schedule.  Pap screening guidelines reviewed.  Self-care, leisure activities encouraged, strained relationship with daughter.    [place], 11:17 AM 06/24/2019

## 2019-06-24 NOTE — Patient Instructions (Addendum)
It was good to see you today. Vitamin D 2000 daily 3D mammogram  [phone]   Health Maintenance for Postmenopausal Women Menopause is a normal process in which your ability to get pregnant comes to an end. This process happens slowly over many months or years, usually between the ages of 9 and 61. Menopause is complete when you have missed your menstrual periods for 12 months. It is important to talk with your health care provider about some of the most common conditions that affect women after menopause (postmenopausal women). These include heart disease, cancer, and bone loss (osteoporosis). Adopting a healthy lifestyle and getting preventive care can help to promote your health and wellness. The actions you take can also lower your chances of developing some of these common conditions. What should I know about menopause? During menopause, you may get a number of symptoms, such as:  Hot flashes. These can be moderate or severe.  Night sweats.  Decrease in sex drive.  Mood swings.  Headaches.  Tiredness.  Irritability.  Memory problems.  Insomnia. Choosing to treat or not to treat these symptoms is a decision that you make with your health care provider. Do I need hormone replacement therapy?  Hormone replacement therapy is effective in treating symptoms that are caused by menopause, such as hot flashes and night sweats.  Hormone replacement carries certain risks, especially as you become older. If you are thinking about using estrogen or estrogen with progestin, discuss the benefits and risks with your health care provider. What is my risk for heart disease and stroke? The risk of heart disease, heart attack, and stroke increases as you age. One of the causes may be a change in the body's hormones during menopause. This can affect how your body uses dietary fats, triglycerides, and cholesterol. Heart attack and stroke are medical emergencies. There are many things that you can do  to help prevent heart disease and stroke. Watch your blood pressure  High blood pressure causes heart disease and increases the risk of stroke. This is more likely to develop in people who have high blood pressure readings, are of African descent, or are overweight.  Have your blood pressure checked: ? Every 3-5 years if you are 6-67 years of age. ? Every year if you are 5 years old or older. Eat a healthy diet   Eat a diet that includes plenty of vegetables, fruits, low-fat dairy products, and lean protein.  Do not eat a lot of foods that are high in solid fats, added sugars, or sodium. Get regular exercise Get regular exercise. This is one of the most important things you can do for your health. Most adults should:  Try to exercise for at least 150 minutes each week. The exercise should increase your heart rate and make you sweat (moderate-intensity exercise).  Try to do strengthening exercises at least twice each week. Do these in addition to the moderate-intensity exercise.  Spend less time sitting. Even light physical activity can be beneficial. Other tips  Work with your health care provider to achieve or maintain a healthy weight.  Do not use any products that contain nicotine or tobacco, such as cigarettes, e-cigarettes, and chewing tobacco. If you need help quitting, ask your health care provider.  Know your numbers. Ask your health care provider to check your cholesterol and your blood sugar (glucose). Continue to have your blood tested as directed by your health care provider. Do I need screening for cancer? Depending on your health history  and family history, you may need to have cancer screening at different stages of your life. This may include screening for:  Breast cancer.  Cervical cancer.  Lung cancer.  Colorectal cancer. What is my risk for osteoporosis? After menopause, you may be at increased risk for osteoporosis. Osteoporosis is a condition in which  bone destruction happens more quickly than new bone creation. To help prevent osteoporosis or the bone fractures that can happen because of osteoporosis, you may take the following actions:  If you are 10-53 years old, get at least 1,000 mg of calcium and at least 600 mg of vitamin D per day.  If you are older than age 27 but younger than age 37, get at least 1,200 mg of calcium and at least 600 mg of vitamin D per day.  If you are older than age 38, get at least 1,200 mg of calcium and at least 800 mg of vitamin D per day. Smoking and drinking excessive alcohol increase the risk of osteoporosis. Eat foods that are rich in calcium and vitamin D, and do weight-bearing exercises several times each week as directed by your health care provider. How does menopause affect my mental health? Depression may occur at any age, but it is more common as you become older. Common symptoms of depression include:  Low or sad mood.  Changes in sleep patterns.  Changes in appetite or eating patterns.  Feeling an overall lack of motivation or enjoyment of activities that you previously enjoyed.  Frequent crying spells. Talk with your health care provider if you think that you are experiencing depression. General instructions See your health care provider for regular wellness exams and vaccines. This may include:  Scheduling regular health, dental, and eye exams.  Getting and maintaining your vaccines. These include: ? Influenza vaccine. Get this vaccine each year before the flu season begins. ? Pneumonia vaccine. ? Shingles vaccine. ? Tetanus, diphtheria, and pertussis (Tdap) booster vaccine. Your health care provider may also recommend other immunizations. Tell your health care provider if you have ever been abused or do not feel safe at home. Summary  Menopause is a normal process in which your ability to get pregnant comes to an end.  This condition causes hot flashes, night sweats, decreased  interest in sex, mood swings, headaches, or lack of sleep.  Treatment for this condition may include hormone replacement therapy.  Take actions to keep yourself healthy, including exercising regularly, eating a healthy diet, watching your weight, and checking your blood pressure and blood sugar levels.  Get screened for cancer and depression. Make sure that you are up to date with all your vaccines. This information is not intended to replace advice given to you by your health care provider. Make sure you discuss any questions you have with your health care provider. Document Released: 09/01/2005 Document Revised: 07/03/2018 Document Reviewed: 07/03/2018 Elsevier Patient Education  2020 [employer].

## 2019-06-24 NOTE — Telephone Encounter (Signed)
Left message for patient to remind of missed remote transmission.  

## 2019-06-25 LAB — URINALYSIS, COMPLETE W/RFL CULTURE
Bacteria, UA: NONE SEEN /HPF
Bilirubin Urine: NEGATIVE
Glucose, UA: NEGATIVE
Hgb urine dipstick: NEGATIVE
Hyaline Cast: NONE SEEN /LPF
Ketones, ur: NEGATIVE
Leukocyte Esterase: NEGATIVE
Nitrites, Initial: NEGATIVE
Protein, ur: NEGATIVE
RBC / HPF: NONE SEEN /HPF (ref 0–2)
Specific Gravity, Urine: 1.023 (ref 1.001–1.03)
pH: <=5 (ref 5.0–8.0)

## 2019-06-28 ENCOUNTER — Other Ambulatory Visit: Payer: Self-pay | Admitting: Internal Medicine

## 2019-06-28 DIAGNOSIS — E1165 Type 2 diabetes mellitus with hyperglycemia: Secondary | ICD-10-CM

## 2019-06-30 NOTE — Progress Notes (Signed)
No ICM remote transmission received for 06/23/2019 and next ICM transmission scheduled for 07/08/2019.

## 2019-07-07 ENCOUNTER — Ambulatory Visit (INDEPENDENT_AMBULATORY_CARE_PROVIDER_SITE_OTHER): Payer: Medicare Other | Admitting: *Deleted

## 2019-07-07 DIAGNOSIS — I5022 Chronic systolic (congestive) heart failure: Secondary | ICD-10-CM

## 2019-07-07 LAB — CUP PACEART REMOTE DEVICE CHECK
Battery Remaining Longevity: 60 mo
Battery Voltage: 2.97 V
Brady Statistic AP VP Percent: 0.97 %
Brady Statistic AP VS Percent: 0.01 %
Brady Statistic AS VP Percent: 98.98 %
Brady Statistic AS VS Percent: 0.04 %
Brady Statistic RA Percent Paced: 0.97 %
Brady Statistic RV Percent Paced: 99.44 %
Date Time Interrogation Session: 20201214042306
HighPow Impedance: 63 Ohm
Implantable Lead Implant Date: 20180524
Implantable Lead Implant Date: 20180524
Implantable Lead Implant Date: 20180524
Implantable Lead Location: 753858
Implantable Lead Location: 753859
Implantable Lead Location: 753860
Implantable Lead Model: 4398
Implantable Lead Model: 5076
Implantable Pulse Generator Implant Date: 20180524
Lead Channel Impedance Value: 133 Ohm
Lead Channel Impedance Value: 133 Ohm
Lead Channel Impedance Value: 145.871
Lead Channel Impedance Value: 145.871
Lead Channel Impedance Value: 145.871
Lead Channel Impedance Value: 266 Ohm
Lead Channel Impedance Value: 266 Ohm
Lead Channel Impedance Value: 266 Ohm
Lead Channel Impedance Value: 323 Ohm
Lead Channel Impedance Value: 323 Ohm
Lead Channel Impedance Value: 399 Ohm
Lead Channel Impedance Value: 456 Ohm
Lead Channel Impedance Value: 475 Ohm
Lead Channel Impedance Value: 475 Ohm
Lead Channel Impedance Value: 513 Ohm
Lead Channel Impedance Value: 532 Ohm
Lead Channel Impedance Value: 532 Ohm
Lead Channel Impedance Value: 570 Ohm
Lead Channel Pacing Threshold Amplitude: 0.375 V
Lead Channel Pacing Threshold Amplitude: 0.5 V
Lead Channel Pacing Threshold Amplitude: 0.625 V
Lead Channel Pacing Threshold Pulse Width: 0.4 ms
Lead Channel Pacing Threshold Pulse Width: 0.4 ms
Lead Channel Pacing Threshold Pulse Width: 1 ms
Lead Channel Sensing Intrinsic Amplitude: 20.75 mV
Lead Channel Sensing Intrinsic Amplitude: 20.75 mV
Lead Channel Sensing Intrinsic Amplitude: 3.25 mV
Lead Channel Sensing Intrinsic Amplitude: 3.25 mV
Lead Channel Setting Pacing Amplitude: 0.75 V
Lead Channel Setting Pacing Amplitude: 1.5 V
Lead Channel Setting Pacing Amplitude: 2.5 V
Lead Channel Setting Pacing Pulse Width: 0.4 ms
Lead Channel Setting Pacing Pulse Width: 1 ms
Lead Channel Setting Sensing Sensitivity: 0.3 mV

## 2019-07-08 ENCOUNTER — Ambulatory Visit (INDEPENDENT_AMBULATORY_CARE_PROVIDER_SITE_OTHER): Payer: Medicare Other

## 2019-07-08 DIAGNOSIS — Z95810 Presence of automatic (implantable) cardiac defibrillator: Secondary | ICD-10-CM

## 2019-07-08 DIAGNOSIS — I5022 Chronic systolic (congestive) heart failure: Secondary | ICD-10-CM

## 2019-07-09 ENCOUNTER — Other Ambulatory Visit: Payer: Self-pay

## 2019-07-10 ENCOUNTER — Other Ambulatory Visit: Payer: Self-pay | Admitting: Women's Health

## 2019-07-10 ENCOUNTER — Ambulatory Visit (INDEPENDENT_AMBULATORY_CARE_PROVIDER_SITE_OTHER): Payer: Medicare Other

## 2019-07-10 DIAGNOSIS — Z780 Asymptomatic menopausal state: Secondary | ICD-10-CM | POA: Diagnosis not present

## 2019-07-10 DIAGNOSIS — Z13820 Encounter for screening for osteoporosis: Secondary | ICD-10-CM

## 2019-07-11 ENCOUNTER — Encounter: Payer: Self-pay | Admitting: Internal Medicine

## 2019-07-11 ENCOUNTER — Telehealth: Payer: Self-pay

## 2019-07-11 NOTE — Progress Notes (Signed)
EPIC Encounter for ICM Monitoring  Patient Name: Susan Peck is a 53 y.o. female Date: 07/11/2019 Primary Care Physican: Harvie Heck, MD Primary Cardiologist:Bensimhon Electrophysiologist:Klein Bi-V Pacing:99.4% Weight:191 - 195 lbs  Attempted call to patient and unable to reach.  Left detailed message per DPR regarding transmission. Transmission reviewed.   OptivolThoracic impedanceshows pattern of a few days of possible fluid accumulation followed by a few days at baseline.  Prescribed dosage:Furosemide40 mg take 2 tablets (80 mg total) every morning and 1 tablet (40 mg total) every evening.  Recommendations: Left voice mail with ICM number and encouraged to call if experiencing any fluid symptoms.  Follow-up plan: ICM clinic phone appointment on 08/18/2019.   91 day device clinic remote transmission 10/06/2019.    Copy of ICM check sent to Dr. Caryl Comes.   3 month ICM trend: 07/07/2019    1 Year ICM trend:       [name], RN 07/11/2019 2:31 PM

## 2019-07-11 NOTE — Telephone Encounter (Signed)
Remote ICM transmission received.  Attempted call to patient regarding ICM remote transmission and left detailed message per DPR.  Advised to return call for any fluid symptoms or questions. Next ICM remote transmission scheduled 08/18/2019.

## 2019-07-17 ENCOUNTER — Other Ambulatory Visit: Payer: Self-pay

## 2019-07-17 NOTE — Patient Outreach (Signed)
[facility]) Care Management  07/17/2019  Susan Peck July 06, 1966 [id number]   Medication Adherence call to [name] with patient she wants a call at a later time. Patient is showing past due on Atorvastatin 40 mg under [facility].   [facility] [facility] Management Direct Dial [phone]  Fax [phone] [email or web address]

## 2019-08-10 NOTE — Progress Notes (Signed)
ICD remote 

## 2019-08-18 ENCOUNTER — Ambulatory Visit (INDEPENDENT_AMBULATORY_CARE_PROVIDER_SITE_OTHER): Payer: Medicare Other

## 2019-08-18 ENCOUNTER — Other Ambulatory Visit (HOSPITAL_COMMUNITY): Payer: Self-pay | Admitting: *Deleted

## 2019-08-18 DIAGNOSIS — Z95810 Presence of automatic (implantable) cardiac defibrillator: Secondary | ICD-10-CM | POA: Diagnosis not present

## 2019-08-18 DIAGNOSIS — I5022 Chronic systolic (congestive) heart failure: Secondary | ICD-10-CM

## 2019-08-18 MED ORDER — FUROSEMIDE 40 MG PO TABS: ORAL_TABLET | ORAL | 0 refills | Status: DC

## 2019-08-18 MED ORDER — ENTRESTO 97-103 MG PO TABS: 1.0000 | ORAL_TABLET | Freq: Two times a day (BID) | ORAL | 11 refills | Status: DC

## 2019-08-18 MED ORDER — EPLERENONE 25 MG PO TABS: 12.5000 mg | ORAL_TABLET | Freq: Every day | ORAL | 3 refills | Status: DC

## 2019-08-20 ENCOUNTER — Ambulatory Visit: Payer: Medicare Other | Attending: Internal Medicine

## 2019-08-20 DIAGNOSIS — Z20822 Contact with and (suspected) exposure to covid-19: Secondary | ICD-10-CM | POA: Diagnosis not present

## 2019-08-21 ENCOUNTER — Encounter: Payer: Self-pay | Admitting: Internal Medicine

## 2019-08-21 LAB — NOVEL CORONAVIRUS, NAA: SARS-CoV-2, NAA: DETECTED — AB

## 2019-08-22 ENCOUNTER — Encounter: Payer: Self-pay | Admitting: Internal Medicine

## 2019-08-22 ENCOUNTER — Telehealth: Payer: Self-pay | Admitting: Adult Health

## 2019-08-22 ENCOUNTER — Telehealth: Payer: Self-pay | Admitting: Internal Medicine

## 2019-08-22 ENCOUNTER — Encounter (HOSPITAL_COMMUNITY): Payer: Self-pay

## 2019-08-22 ENCOUNTER — Encounter: Payer: Self-pay | Admitting: Adult Health

## 2019-08-22 NOTE — Telephone Encounter (Signed)
  I connected by phone with Domenick Bookbinder on 08/22/2019 at 8:44 AM to discuss the potential use of an new treatment for mild to moderate COVID-19 viral infection in non-hospitalized patients.  This patient is a 54 y.o. female that meets the FDA criteria for Emergency Use Authorization of bamlanivimab or casirivimab\imdevimab.  Has a (+) direct SARS-CoV-2 viral test result  Has mild or moderate COVID-19   Is ? 54 years of age and weighs ? 40 kg  Is NOT hospitalized due to COVID-19  Is NOT requiring oxygen therapy or requiring an increase in baseline oxygen flow rate due to COVID-19  Is within 10 days of symptom onset  Has at least one of the high risk factor(s) for progression to severe COVID-19 and/or hospitalization as defined in EUA.  Specific high risk criteria : Diabetes   I have spoken and communicated the following to the patient or parent/caregiver:  1. FDA has authorized the emergency use of bamlanivimab and casirivimab\imdevimab for the treatment of mild to moderate COVID-19 in adults and pediatric patients with positive results of direct SARS-CoV-2 viral testing who are 74 years of age and older weighing at least 40 kg, and who are at high risk for progressing to severe COVID-19 and/or hospitalization.  2. The significant known and potential risks and benefits of bamlanivimab and casirivimab\imdevimab, and the extent to which such potential risks and benefits are unknown.  3. Information on available alternative treatments and the risks and benefits of those alternatives, including clinical trials.  4. Patients treated with bamlanivimab and casirivimab\imdevimab should continue to self-isolate and use infection control measures (e.g., wear mask, isolate, social distance, avoid sharing personal items, clean and disinfect "high touch" surfaces, and frequent handwashing) according to CDC guidelines.   5. The patient or parent/caregiver has the option to accept or refuse  bamlanivimab or casirivimab\imdevimab .  After reviewing this information with the patient, The patient agreed to proceed with receiving the bamlanimivab infusion and will be provided a copy of the Fact sheet prior to receiving the infusion.Susan Peck D Bernie Fobes 08/22/2019 8:44 AM

## 2019-08-22 NOTE — Progress Notes (Signed)
EPIC Encounter for ICM Monitoring  Patient Name: Susan Peck is a 54 y.o. female Date: 08/22/2019 Primary Care Physican: Harvie Heck, MD Primary Cardiologist:Bensimhon Electrophysiologist:Klein Bi-V Pacing:99.6% Weight:191 - 195 lbs  Transmission reviewed.   OptivolThoracic impedancenormal.  Prescribed dosage:Furosemide40 mg take 2 tablets (80 mg total) every morning and 1 tablet (40 mg total) every evening.  Recommendations:  None  Follow-up plan: ICM clinic phone appointment on3/07/2019. 91 day device clinic remote transmission 10/06/2019.  Office visit with Dr Haroldine Laws 08/28/2019.   Copy of ICM check sent to [place].    3 month ICM trend: 08/18/2019    1 Year ICM trend:       [name], RN 08/22/2019 2:44 PM

## 2019-08-22 NOTE — Telephone Encounter (Signed)
Patient calling stating tested positive for covid, but the infusion center called to make an appointment tomorrow at 8:30. She would like to know if this is a good idea or if she needs to cancel.

## 2019-08-23 ENCOUNTER — Ambulatory Visit (HOSPITAL_COMMUNITY): Admission: RE | Admit: 2019-08-23 | Payer: Medicare Other | Source: Ambulatory Visit

## 2019-08-23 ENCOUNTER — Other Ambulatory Visit (HOSPITAL_COMMUNITY): Payer: Self-pay | Admitting: Nurse Practitioner

## 2019-08-23 ENCOUNTER — Telehealth: Payer: Self-pay | Admitting: Adult Health

## 2019-08-23 DIAGNOSIS — U071 COVID-19: Secondary | ICD-10-CM

## 2019-08-23 NOTE — Telephone Encounter (Signed)
Susan Peck called me with concerns about MAB infusions. I again discussed the FDA information regarding the infusion- risks/benefits. She reports sending her PCP questions about the infusion and stated "I think its her nurse responding, not her". I encouraged her to keep her infusion appt, but ultimately she would like to cxl. She reports that she had been attempting to call Infusion center "all day but no one will answer". I will relay her wish to cxl on 08/23/2019 at 0830. All questions/concerns answered.  Call occurred 08/22/2019 approx 1645  Delayed documentation  Mina Marble, NP-C

## 2019-08-23 NOTE — Progress Notes (Signed)
Orders entered for bamlanivimab/monoclonal antibody therapy

## 2019-08-26 ENCOUNTER — Encounter (HOSPITAL_COMMUNITY): Payer: Self-pay

## 2019-08-26 ENCOUNTER — Telehealth: Payer: Self-pay | Admitting: Internal Medicine

## 2019-08-26 NOTE — Telephone Encounter (Signed)
Left voicemail message for pt to contact RN at [phone].

## 2019-08-26 NOTE — Telephone Encounter (Signed)
  Patient is following up again on return call

## 2019-08-26 NOTE — Telephone Encounter (Signed)
Spoke with pt who states she decided not to get the infusion to treat for Covid after speaking with other providers.   Pt advised RN will notate this in her chart.  Pt verbalizes understanding.

## 2019-08-26 NOTE — Telephone Encounter (Signed)
New Message ° °Patient is returning call. Please give patient a call back.  °

## 2019-08-28 ENCOUNTER — Encounter (HOSPITAL_COMMUNITY): Payer: Medicare Other | Admitting: Internal Medicine

## 2019-08-28 ENCOUNTER — Ambulatory Visit (HOSPITAL_COMMUNITY)
Admission: RE | Admit: 2019-08-28 | Discharge: 2019-08-28 | Disposition: A | Discharge status: Home / Self Care | Payer: Medicare Other | Source: Ambulatory Visit | Attending: Internal Medicine | Admitting: Internal Medicine

## 2019-08-28 ENCOUNTER — Other Ambulatory Visit: Payer: Self-pay

## 2019-08-28 DIAGNOSIS — Z95810 Presence of automatic (implantable) cardiac defibrillator: Secondary | ICD-10-CM | POA: Diagnosis not present

## 2019-08-28 DIAGNOSIS — I447 Left bundle-branch block, unspecified: Secondary | ICD-10-CM | POA: Diagnosis not present

## 2019-08-28 DIAGNOSIS — Z7189 Other specified counseling: Secondary | ICD-10-CM | POA: Diagnosis not present

## 2019-08-28 DIAGNOSIS — I5022 Chronic systolic (congestive) heart failure: Secondary | ICD-10-CM | POA: Diagnosis not present

## 2019-08-28 NOTE — Progress Notes (Signed)
Heart Failure TeleHealth Note  Due to national recommendations of social distancing due to [place] 19, Audio/video telehealth visit is felt to be most appropriate for this patient at this time.  See MyChart message from today for patient consent regarding telehealth for Susan Peck.  Date:  08/28/2019   ID:  Susan Peck, DOB June 29, 1966, MRN [id number]  Location: Home  Provider location: [facility] Type of Visit: Established patient  PCP:  Harvie Heck, MD  Cardiologist:  No primary care provider on file. Primary HF: Susan Peck  Chief Complaint: Heart Failure follow-up   History of Present Illness:  Susan Peck is a 54 year old woman with systolic heart failure, HTN, GERD,  multinodular goiter and asthma.   Diagnosed with systolic HF in 0000000 with EF 25-30% Cath with normal cors. Felt to be LBBB related and received CRT-D with improvement in EF. Last echo 3/19 EF 45-50%  We have not seen her since 2019. Recently has had problems with diaphragmatic stim and LV lead pacing vector adjusted. She presents via Engineer, civil (consulting) for a telehealth visit today.  Diagnosed with COVID last week and now has severe cough. No fevers or chills. No orthopnea or PND. Before COVID was doing well but was having some right-sided CP but no SOB, or other HF symptoms.   Cardiac studies:  Echo 3/19 EF improved to ~45-50%., GLS -14.8  04/03/2011  ECHO EF 25-30% 06/26/2012 ECHO EF 30-35%   11/18/12 ECHO EF 55% Inferior wall mildly hypokinetic with septal HK 11/14 Cardiac MRI  EF 52% No LGE. LBBB with dyssynergy 6/16: ECHO EF 35-40% 09/07/2015: ECHO EF 30-35%   CPX 11/20/2014  Peak VO2: 16.1 (76.8% predicted peak VO2) - when corrected to ideal BW pVO2 is 22.9 VE/VCO2 slope: 28.5 OUES: 1.59 Peak RER: 1.18  LHC/RHC 09/15/2015  RA = 2 RV = 22/1/2 PA = 27/8 (19) PCW = 8 Fick cardiac output/index = 5.22/2.8 PVR = 2.1 Ao sat = 98% PA sat = 70%,  71% Assessment: 1. Normal coronary arteries 2. Normal hemodynamics  3. LVEF 35-40% with global HK due to   [place] denies symptoms worrisome for COVID 19.   Past Medical History:  Diagnosis Date  . AICD (automatic cardioverter/defibrillator) present 12/13/2016  . Anxiety    no meds  . Asthma   . CHF (congestive heart failure) ([place])   . Depression    no meds  . Diabetes mellitus    recent dx 11/17/11 - started med 11/18/11 type 2  . GERD (gastroesophageal reflux disease)   . Goiter 07/27/2011   non-neoplastic goiter - fine needle aspiration - benign sees dr Dwyane Dee for  . Heart murmur    dx 2 yrs ago per pt  . Herpes genitalis in women   . Hypertension   . IBS (irritable bowel syndrome)    tx with diet per pt  . Low back pain    history  . Obesity   . Seasonal allergies   . Shortness of breath    occasional - exercise induced  . Sleep apnea    cpap broken  . Systolic heart failure    May 2011 EF 35-40%, 04/03/11 EF 25-30%, 06/27/11 EF 30-35%   Past Surgical History:  Procedure Laterality Date  . ABDOMINAL HYSTERECTOMY    . BIV ICD INSERTION CRT-D N/A 12/13/2016   Procedure: BiV ICD Insertion CRT-D;  Surgeon: Deboraha Sprang, MD;  Location: [facility];  Service: Cardiovascular;  Laterality: N/A;  . cardiac catherization  2004   [place], [place] - Dr Lynnell Jude  . CARDIAC CATHETERIZATION    . CARDIAC CATHETERIZATION N/A 09/15/2015   Procedure: Right/Left Heart Cath and Coronary Angiography;  Surgeon: Jolaine Artist, MD;  Location: [facility];  Service: Cardiovascular;  Laterality: N/A;  . COLONOSCOPY WITH PROPOFOL N/A 03/26/2015   Procedure: COLONOSCOPY WITH PROPOFOL;  Surgeon: Carol Ada, MD;  Location: [facility];  Service: Endoscopy;  Laterality: N/A;  . colonscopy    . CYSTOSCOPY  11/23/2011   Procedure: CYSTOSCOPY;  Surgeon: Jolayne Haines, MD;  Location: [facility];  Service: Gynecology;  Laterality: N/A;  . DIAGNOSTIC LAPAROSCOPY     of pelvis   . DILATION AND CURETTAGE OF UTERUS  12/2006,  10/2005   hysteroscopy Peck x 2  . INSERTION OF ICD  12/13/2016   BIV  . LAPAROSCOPIC ASSISTED VAGINAL HYSTERECTOMY  11/23/2011   Procedure: LAPAROSCOPIC ASSISTED VAGINAL HYSTERECTOMY;  Surgeon: Jolayne Haines, MD;  Location: [facility];  Service: Gynecology;  Laterality: N/A;  . NOVASURE ABLATION     10/2005  . SALPINGOOPHORECTOMY  11/23/2011   Procedure: SALPINGO OOPHERECTOMY;  Surgeon: Jolayne Haines, MD;  Location: [facility];  Service: Gynecology;  Laterality: Bilateral;  . SVD     x 3  . TOOTH EXTRACTION N/A 04/05/2017   Procedure: DENTAL EXTRACTIONS OF TEETH FIVE, SEVEN, SIXTEEN, SEVENTEEN;  Surgeon: Diona Browner, DDS;  Location: [facility];  Service: Oral Peck;  Laterality: N/A;  . TUBAL LIGATION    . WISDOM TOOTH EXTRACTION       Current Outpatient Medications  Medication Sig Dispense Refill  . albuterol (PROVENTIL HFA;VENTOLIN HFA) 108 (90 Base) MCG/ACT inhaler Inhale 1-2 puffs into the lungs every 6 (six) hours as needed for wheezing or shortness of breath. 1 Inhaler 0  . aspirin 81 MG chewable tablet Chew 81 mg by mouth daily.     Marland Kitchen atorvastatin (LIPITOR) 40 MG tablet Take 1 tablet (40 mg total) by mouth daily. 30 tablet 11  . carvedilol (COREG) 25 MG tablet Take 1 tablet (25 mg total) by mouth 2 (two) times daily with a meal. 60 tablet 11  . diclofenac sodium (VOLTAREN) 1 % GEL Apply 2 g topically 4 (four) times daily as needed (pain). 100 g 0  . eplerenone (INSPRA) 25 MG tablet Take 0.5 tablets (12.5 mg total) by mouth daily. 45 tablet 3  . fluticasone (FLONASE) 50 MCG/ACT nasal spray Place 1 spray into both nostrils 2 (two) times daily. 16 g 1  . furosemide (LASIX) 40 MG tablet TAKE 2 TABS EVERY MORNING AND 1 TAB EVERY EVENING. 270 tablet 0  . glucose blood (ONETOUCH VERIO) test strip Insulin dependent. Check blood sugar up to 3 times daily. diag code E11.65 100 each 12  . Insulin Degludec-Liraglutide (XULTOPHY) 100-3.6 UNIT-MG/ML SOPN  Inject 12 Units/day into the skin at bedtime for 30 days. 15 pen 11  . Insulin Pen Needle 32G X 4 MM MISC Use to inject xultophy daily 100 each 5  . Lancet Devices (ONETOUCH DELICA PLUS LANCING) MISC USE AS DIRECTED TO CHECK BLOOD SUGAR 1 each 2  . loratadine (CLARITIN) 10 MG tablet Take 1 tablet (10 mg total) by mouth daily. 30 tablet 2  . mupirocin cream (BACTROBAN) 2 % Apply 1 application topically 2 (two) times daily. 15 g 1  . ONETOUCH DELICA LANCETS FINE MISC Insulin dependent. Check blood sugar up to 3 times daily. diag code E11.65 100 each  12  . PATADAY 0.2 % SOLN Place 1 drop into both eyes 2 (two) times daily.   4  . polyethylene glycol (MIRALAX / GLYCOLAX) packet Take 17 g by mouth daily. 14 each 0  . rOPINIRole (REQUIP) 1 MG tablet Take 3 tablets (3 mg total) by mouth at bedtime. Take 1-3 hours before bed. 30 tablet 3  . sacubitril-valsartan (ENTRESTO) 97-103 MG Take 1 tablet by mouth 2 (two) times daily. 60 tablet 11  . sodium chloride (OCEAN) 0.65 % SOLN nasal spray Place 2 sprays into both nostrils daily as needed for congestion. 15 mL 1   No current facility-administered medications for this encounter.    Allergies:   Shrimp [shellfish allergy]   Social History:  The patient  reports that she has never smoked. She has never used smokeless tobacco. She reports previous alcohol use. She reports that she does not use drugs.   Family History:  The patient's family history includes Breast cancer in her maternal aunt and mother; Diabetes in her maternal grandfather, maternal grandmother, paternal grandfather, and paternal grandmother; Heart disease in her maternal aunt and maternal aunt; Hypertension in her maternal grandmother and paternal grandfather; Thyroid disease in her mother.   ROS:  Please see the history of present illness.   All other systems are personally reviewed and negative.   Exam:  (Video/Tele Health Call; Exam is subjective and or/visual.) General:  Speaks in full  sentences. No resp difficulty. + cough Lungs: Normal respiratory effort with conversation.  Abdomen: Non-distended per patient report Extremities: Pt denies edema. Neuro: Alert & oriented x 3.   Recent Labs: 04/15/2019: BUN 14; Creatinine, Ser 0.71; Potassium 4.1; Sodium 140; TSH 1.100  Personally reviewed   Wt Readings from Last 3 Encounters:  06/24/19 84.4 kg (186 lb)  04/15/19 86.7 kg (191 lb 1.6 oz)  11/27/18 87.1 kg (192 lb)      ASSESSMENT AND PLAN:  1. Chronic systolic HF: Nonischemic cardiomyopathy. - - - - 08/2015 ECHO EF 35-40% - Echo 2/19 LVEF 45-50%.s/p CRT-P  - NYHA I symptoms  - Volume status stable - Continue lasix 80/40 - Continue inspra 12.5 mg daily. ( Intolerant to spiro with painful gynecomastia) - Continue coreg 25 mg BID.  - Intolerant to Bidil with severe headaches.  - Continue entresto 97/103 mg BID.  - Failed Jardiance due to nausea 2. COVID - follows with PCP. Discussed need to quarantine for another week  3. Chest pain - Doubt ischemia LHC 08/2015 with normal cors.   4. HTN:  - BP stable 5. OSA:  - Encouraged nightly compliance.  6. DM II: followed by  Endocrinologist.  - Failed Jardiance   COVID screen The patient does not have any symptoms that suggest any further testing/ screening at this time.  Social distancing reinforced today.  Recommended follow-up:  As above  Relevant cardiac medications were reviewed at length with the patient today.   The patient does not have concerns regarding their medications at this time.   The following changes were made today:  As above  Today, I have spent 18 minutes with the patient with telehealth technology discussing the above issues .    Signed, Glori Bickers, MD  08/28/2019 12:55 PM  [facility] [address] [facility] [place] [address] [phone] (office) [phone] (fax)

## 2019-08-29 ENCOUNTER — Telehealth: Payer: Self-pay

## 2019-08-29 NOTE — Telephone Encounter (Signed)
Received TC from patient.  States she has Covid (per labs, she was positive 9 days ago).  States she would like to take something for coughing.  Telehealth appt made for Monday, 09/01/19 @ 3:15/ ACC SChaplin, RN,BSN

## 2019-09-01 ENCOUNTER — Other Ambulatory Visit: Payer: Self-pay

## 2019-09-01 ENCOUNTER — Encounter: Payer: Self-pay | Admitting: Internal Medicine

## 2019-09-01 ENCOUNTER — Ambulatory Visit (INDEPENDENT_AMBULATORY_CARE_PROVIDER_SITE_OTHER): Payer: Medicare Other | Admitting: Internal Medicine

## 2019-09-01 ENCOUNTER — Encounter (INDEPENDENT_AMBULATORY_CARE_PROVIDER_SITE_OTHER): Payer: Self-pay

## 2019-09-01 DIAGNOSIS — U071 COVID-19: Secondary | ICD-10-CM

## 2019-09-01 DIAGNOSIS — B342 Coronavirus infection, unspecified: Secondary | ICD-10-CM | POA: Insufficient documentation

## 2019-09-01 NOTE — Assessment & Plan Note (Signed)
COVID-19 infection: She recently tested positive for COVID-19 after she began experiencing cough, fatigue and malaise.  There was discussion about starting her on May: Antibiotic infusions but she refused.  Today, she states that she is doing well with the exception of dry cough and "sore back" which improves with heating pads.  She denies fevers, chills or shortness of breath.  She inquired if she would need antibiotics and was appropriately counseled.  She is about 12 days out since diagnosis of COVID-19 infection.  Plan: -Continue conservative management.  Advised that she can try Mucinex cough.

## 2019-09-01 NOTE — Progress Notes (Signed)
Internal Medicine Clinic Attending  Case discussed with Dr. Agyei at the time of the visit.  We reviewed the resident's history and exam and pertinent patient test results.  I agree with the assessment, diagnosis, and plan of care documented in the resident's note.    

## 2019-09-01 NOTE — Progress Notes (Signed)
  [facility] Internal [facility] Residency Telephone Encounter Continuity Care Appointment  HPI:   This telephone encounter was created for [name] on 09/01/2019 for the following purpose/cc follow up recent COVID-19 infection.  [name] is a 54 year old woman with recently diagnosed COVID-19 infection who i had pleasure of speaking to the phone to follow-up on symptoms.  Please see problem based charting for further details.   Past Medical History:  Past Medical History:  Diagnosis Date  . AICD (automatic cardioverter/defibrillator) present 12/13/2016  . Anxiety    no meds  . Asthma   . CHF (congestive heart failure) ([place])   . Depression    no meds  . Diabetes mellitus    recent dx 11/17/11 - started med 11/18/11 type 2  . GERD (gastroesophageal reflux disease)   . Goiter 07/27/2011   non-neoplastic goiter - fine needle aspiration - benign sees dr Dwyane Dee for  . Heart murmur    dx 2 yrs ago per pt  . Herpes genitalis in women   . Hypertension   . IBS (irritable bowel syndrome)    tx with diet per pt  . Low back pain    history  . Obesity   . Seasonal allergies   . Shortness of breath    occasional - exercise induced  . Sleep apnea    cpap broken  . Systolic heart failure    May 2011 EF 35-40%, 04/03/11 EF 25-30%, 06/27/11 EF 30-35%      ROS:   +Cough  Back pain   Assessment / Plan / Recommendations:   Please see A&P under problem oriented charting for assessment of the patient's acute and chronic medical conditions.   As always, pt is advised that if symptoms worsen or new symptoms arise, they should go to an urgent care facility or to to ER for further evaluation.   Consent and Medical Decision Making:   Patient discussed with Dr. Lynnae January  This is a telephone encounter between [place] and [place] on 09/01/2019 for following up on recent COVID-19 infection. The visit was conducted with the patient located at home and Jean Rosenthal at [facility].  The patient's identity was confirmed using their DOB and current address. The patient has consented to being evaluated through a telephone encounter and understands the associated risks (an examination cannot be done and the patient may need to come in for an appointment) / benefits (allows the patient to remain at home, decreasing exposure to coronavirus). I personally spent 7 minutes on medical discussion.

## 2019-09-02 ENCOUNTER — Encounter (INDEPENDENT_AMBULATORY_CARE_PROVIDER_SITE_OTHER): Payer: Self-pay

## 2019-09-03 ENCOUNTER — Encounter (INDEPENDENT_AMBULATORY_CARE_PROVIDER_SITE_OTHER): Payer: Self-pay

## 2019-09-04 ENCOUNTER — Encounter (INDEPENDENT_AMBULATORY_CARE_PROVIDER_SITE_OTHER): Payer: Self-pay

## 2019-09-05 ENCOUNTER — Encounter (INDEPENDENT_AMBULATORY_CARE_PROVIDER_SITE_OTHER): Payer: Self-pay

## 2019-09-06 ENCOUNTER — Encounter (INDEPENDENT_AMBULATORY_CARE_PROVIDER_SITE_OTHER): Payer: Self-pay

## 2019-09-07 ENCOUNTER — Encounter (INDEPENDENT_AMBULATORY_CARE_PROVIDER_SITE_OTHER): Payer: Self-pay

## 2019-09-08 ENCOUNTER — Encounter (INDEPENDENT_AMBULATORY_CARE_PROVIDER_SITE_OTHER): Payer: Self-pay

## 2019-09-08 ENCOUNTER — Other Ambulatory Visit: Payer: Self-pay | Admitting: *Deleted

## 2019-09-08 DIAGNOSIS — S81801A Unspecified open wound, right lower leg, initial encounter: Secondary | ICD-10-CM

## 2019-09-08 MED ORDER — MUPIROCIN CALCIUM 2 % EX CREA: 1.0000 "application " | TOPICAL_CREAM | Freq: Two times a day (BID) | CUTANEOUS | 1 refills | Status: DC

## 2019-09-09 ENCOUNTER — Encounter (INDEPENDENT_AMBULATORY_CARE_PROVIDER_SITE_OTHER): Payer: Self-pay

## 2019-09-10 ENCOUNTER — Encounter (INDEPENDENT_AMBULATORY_CARE_PROVIDER_SITE_OTHER): Payer: Self-pay

## 2019-09-11 ENCOUNTER — Encounter (INDEPENDENT_AMBULATORY_CARE_PROVIDER_SITE_OTHER): Payer: Self-pay

## 2019-09-12 ENCOUNTER — Encounter (INDEPENDENT_AMBULATORY_CARE_PROVIDER_SITE_OTHER): Payer: Self-pay

## 2019-09-13 ENCOUNTER — Encounter (INDEPENDENT_AMBULATORY_CARE_PROVIDER_SITE_OTHER): Payer: Self-pay

## 2019-09-14 ENCOUNTER — Encounter (INDEPENDENT_AMBULATORY_CARE_PROVIDER_SITE_OTHER): Payer: Self-pay

## 2019-09-17 ENCOUNTER — Other Ambulatory Visit: Payer: Self-pay | Admitting: *Deleted

## 2019-09-17 ENCOUNTER — Encounter (HOSPITAL_COMMUNITY): Payer: Medicare Other | Admitting: Internal Medicine

## 2019-09-17 MED ORDER — FLUTICASONE PROPIONATE 50 MCG/ACT NA SUSP: 1.0000 | Freq: Two times a day (BID) | NASAL | 1 refills | Status: DC

## 2019-09-23 ENCOUNTER — Telehealth: Payer: Self-pay

## 2019-09-23 NOTE — Telephone Encounter (Signed)
Left message for patient to remind of missed remote transmission.  

## 2019-09-30 NOTE — Progress Notes (Signed)
No ICM remote transmission received for 09/22/2019 and next ICM transmission scheduled for 10/07/2019.   

## 2019-10-06 ENCOUNTER — Ambulatory Visit (INDEPENDENT_AMBULATORY_CARE_PROVIDER_SITE_OTHER): Payer: Medicare Other | Admitting: *Deleted

## 2019-10-06 DIAGNOSIS — I5022 Chronic systolic (congestive) heart failure: Secondary | ICD-10-CM | POA: Diagnosis not present

## 2019-10-06 LAB — CUP PACEART REMOTE DEVICE CHECK
Battery Remaining Longevity: 54 mo
Battery Voltage: 2.97 V
Brady Statistic AP VP Percent: 0.71 %
Brady Statistic AP VS Percent: 0.01 %
Brady Statistic AS VP Percent: 99.19 %
Brady Statistic AS VS Percent: 0.08 %
Brady Statistic RA Percent Paced: 0.72 %
Brady Statistic RV Percent Paced: 99.18 %
Date Time Interrogation Session: 20210315012409
HighPow Impedance: 77 Ohm
Implantable Lead Implant Date: 20180524
Implantable Lead Implant Date: 20180524
Implantable Lead Implant Date: 20180524
Implantable Lead Location: 753858
Implantable Lead Location: 753859
Implantable Lead Location: 753860
Implantable Lead Model: 4398
Implantable Lead Model: 5076
Implantable Pulse Generator Implant Date: 20180524
Lead Channel Impedance Value: 165.029
Lead Channel Impedance Value: 172.541
Lead Channel Impedance Value: 180.5 Ohm
Lead Channel Impedance Value: 189.525
Lead Channel Impedance Value: 189.525
Lead Channel Impedance Value: 304 Ohm
Lead Channel Impedance Value: 361 Ohm
Lead Channel Impedance Value: 361 Ohm
Lead Channel Impedance Value: 399 Ohm
Lead Channel Impedance Value: 399 Ohm
Lead Channel Impedance Value: 418 Ohm
Lead Channel Impedance Value: 456 Ohm
Lead Channel Impedance Value: 532 Ohm
Lead Channel Impedance Value: 608 Ohm
Lead Channel Impedance Value: 608 Ohm
Lead Channel Impedance Value: 627 Ohm
Lead Channel Impedance Value: 627 Ohm
Lead Channel Impedance Value: 627 Ohm
Lead Channel Pacing Threshold Amplitude: 0.375 V
Lead Channel Pacing Threshold Amplitude: 0.5 V
Lead Channel Pacing Threshold Amplitude: 0.625 V
Lead Channel Pacing Threshold Pulse Width: 0.4 ms
Lead Channel Pacing Threshold Pulse Width: 0.4 ms
Lead Channel Pacing Threshold Pulse Width: 1 ms
Lead Channel Sensing Intrinsic Amplitude: 25.5 mV
Lead Channel Sensing Intrinsic Amplitude: 25.5 mV
Lead Channel Sensing Intrinsic Amplitude: 3 mV
Lead Channel Sensing Intrinsic Amplitude: 3 mV
Lead Channel Setting Pacing Amplitude: 0.75 V
Lead Channel Setting Pacing Amplitude: 1.5 V
Lead Channel Setting Pacing Amplitude: 2.5 V
Lead Channel Setting Pacing Pulse Width: 0.4 ms
Lead Channel Setting Pacing Pulse Width: 1 ms
Lead Channel Setting Sensing Sensitivity: 0.3 mV

## 2019-10-07 ENCOUNTER — Other Ambulatory Visit: Payer: Self-pay

## 2019-10-07 ENCOUNTER — Ambulatory Visit (INDEPENDENT_AMBULATORY_CARE_PROVIDER_SITE_OTHER): Payer: Medicare Other

## 2019-10-07 ENCOUNTER — Ambulatory Visit (INDEPENDENT_AMBULATORY_CARE_PROVIDER_SITE_OTHER): Payer: Medicare Other | Admitting: Internal Medicine

## 2019-10-07 ENCOUNTER — Encounter: Payer: Self-pay | Admitting: Internal Medicine

## 2019-10-07 VITALS — BP 136/78 | HR 88 | Temp 98.1°F | Ht 64.0 in | Wt 190.5 lb

## 2019-10-07 DIAGNOSIS — E049 Nontoxic goiter, unspecified: Secondary | ICD-10-CM

## 2019-10-07 DIAGNOSIS — Z794 Long term (current) use of insulin: Secondary | ICD-10-CM

## 2019-10-07 DIAGNOSIS — F329 Major depressive disorder, single episode, unspecified: Secondary | ICD-10-CM

## 2019-10-07 DIAGNOSIS — G4733 Obstructive sleep apnea (adult) (pediatric): Secondary | ICD-10-CM

## 2019-10-07 DIAGNOSIS — Z9989 Dependence on other enabling machines and devices: Secondary | ICD-10-CM

## 2019-10-07 DIAGNOSIS — M545 Low back pain: Secondary | ICD-10-CM | POA: Diagnosis not present

## 2019-10-07 DIAGNOSIS — G8929 Other chronic pain: Secondary | ICD-10-CM | POA: Diagnosis not present

## 2019-10-07 DIAGNOSIS — G2581 Restless legs syndrome: Secondary | ICD-10-CM

## 2019-10-07 DIAGNOSIS — F32A Depression, unspecified: Secondary | ICD-10-CM

## 2019-10-07 DIAGNOSIS — Z95810 Presence of automatic (implantable) cardiac defibrillator: Secondary | ICD-10-CM

## 2019-10-07 DIAGNOSIS — Z79899 Other long term (current) drug therapy: Secondary | ICD-10-CM

## 2019-10-07 DIAGNOSIS — E042 Nontoxic multinodular goiter: Secondary | ICD-10-CM

## 2019-10-07 DIAGNOSIS — E118 Type 2 diabetes mellitus with unspecified complications: Secondary | ICD-10-CM | POA: Diagnosis not present

## 2019-10-07 DIAGNOSIS — I5022 Chronic systolic (congestive) heart failure: Secondary | ICD-10-CM

## 2019-10-07 DIAGNOSIS — E1165 Type 2 diabetes mellitus with hyperglycemia: Secondary | ICD-10-CM

## 2019-10-07 LAB — GLUCOSE, CAPILLARY: Glucose-Capillary: 214 mg/dL — ABNORMAL HIGH (ref 70–99)

## 2019-10-07 LAB — POCT GLYCOSYLATED HEMOGLOBIN (HGB A1C): Hemoglobin A1C: 5.9 % — AB (ref 4.0–5.6)

## 2019-10-07 MED ORDER — EPLERENONE 25 MG PO TABS: 12.5000 mg | ORAL_TABLET | Freq: Every day | ORAL | 3 refills | Status: DC

## 2019-10-07 MED ORDER — FUROSEMIDE 40 MG PO TABS: ORAL_TABLET | ORAL | 0 refills | Status: DC

## 2019-10-07 MED ORDER — CYCLOBENZAPRINE HCL 5 MG PO TABS: 5.0000 mg | ORAL_TABLET | Freq: Three times a day (TID) | ORAL | 3 refills | Status: AC | PRN

## 2019-10-07 NOTE — Assessment & Plan Note (Addendum)
Patient has been evaluated multiple times for ongoing chronic lower back pain radiating to bilateral thighs. Prior lumbar radiograph in 2017 with minimal osteoarthritic changes of lower lumbar spine. She was previously prescribed gabapentin without any significant relief and had temporary relief with ropinirole. At her last visit, ropinirole was increased to 3mg  qHS; however, patient has not had any significant relief and notes inability to tolerate the side effects. She notes continued bilateral leg pain at night that is relieved with getting up and moving around, as a result of which she is not getting much sleep at night and notes fatigue throughout the day.  On examination, tenderness to palpation of the lumbar paraspinal muscles but no motor or sensory deficits noted on examination. Gait wnl. No signs of spinal cord involvement noted. Doubt that lumbar radiograph will assist at this point. At this time, will give trial of cyclobenzaprine 5mg  tid prn.   Plan:  - Start cyclobenzaprine 5mg  tid prn  - Continue to monitor

## 2019-10-07 NOTE — Assessment & Plan Note (Signed)
Patient has history of multinodular goiter. She is currently asymptomatic and most recent TSH wnl. She has not had a thyroid US recently and notes that she does not follow with Dr. Dwyane Dee any more.  Plan: - Thyroid US at next visit  - Encouraged to follow up with endocrinologist

## 2019-10-07 NOTE — Progress Notes (Signed)
CC: restless leg syndrome   HPI:  Susan Peck is a 54 y.o. female with PMHx as listed below presenting for worsening of restless leg syndrome. Please see problem based charting for further assessment and plan.   Past Medical History:  Diagnosis Date  . AICD (automatic cardioverter/defibrillator) present 12/13/2016  . Anxiety    no meds  . Asthma   . CHF (congestive heart failure) ([place])   . Depression    no meds  . Diabetes mellitus    recent dx 11/17/11 - started med 11/18/11 type 2  . GERD (gastroesophageal reflux disease)   . Goiter 07/27/2011   non-neoplastic goiter - fine needle aspiration - benign sees dr Dwyane Dee for  . Heart murmur    dx 2 yrs ago per pt  . Herpes genitalis in women   . Hypertension   . IBS (irritable bowel syndrome)    tx with diet per pt  . Low back pain    history  . Obesity   . Seasonal allergies   . Shortness of breath    occasional - exercise induced  . Sleep apnea    cpap broken  . Systolic heart failure    May 2011 EF 35-40%, 04/03/11 EF 25-30%, 06/27/11 EF 30-35%   Review of Systems:   Review of Systems  Constitutional: Positive for malaise/fatigue. Negative for chills, diaphoresis and fever.  Eyes: Negative for blurred vision and double vision.  Respiratory: Negative for cough, shortness of breath and wheezing.   Cardiovascular: Negative for chest pain and palpitations.  Gastrointestinal: Negative for abdominal pain, constipation, diarrhea, nausea and vomiting.  Genitourinary: Negative for dysuria, frequency and urgency.  Musculoskeletal: Positive for back pain and myalgias. Negative for joint pain and neck pain.  Neurological: Negative for dizziness, tingling, sensory change, focal weakness, weakness and headaches.  Psychiatric/Behavioral: Negative for depression. The patient is not nervous/anxious.      Physical Exam:  Vitals:   10/07/19 1420  BP: 136/78  Pulse: 88  Temp: 98.1 F (36.7 C)  TempSrc: Oral  SpO2: 99%    Weight: 190 lb 8 oz (86.4 kg)  Height: 5\' 4"  (1.626 m)   Physical Exam Constitutional:      General: She is not in acute distress.    Appearance: She is obese. She is not ill-appearing or diaphoretic.  HENT:     Head: Normocephalic and atraumatic.     Mouth/Throat:     Mouth: Mucous membranes are moist.     Pharynx: Oropharynx is clear.  Eyes:     General: No scleral icterus.    Extraocular Movements: Extraocular movements intact.     Conjunctiva/sclera: Conjunctivae normal.  Cardiovascular:     Rate and Rhythm: Normal rate and regular rhythm.     Pulses: Normal pulses.     Heart sounds: Normal heart sounds. No murmur. No gallop.   Pulmonary:     Effort: Pulmonary effort is normal. No respiratory distress.     Breath sounds: Normal breath sounds. No wheezing or rales.  Abdominal:     General: Bowel sounds are normal. There is no distension.     Palpations: Abdomen is soft.     Tenderness: There is no abdominal tenderness.  Musculoskeletal:        General: Tenderness present. No swelling, deformity or signs of injury. Normal range of motion.     Comments: Tenderness to palpation of the lumbar paraspinal muscles   Skin:    General: Skin is warm and  dry.     Capillary Refill: Capillary refill takes less than 2 seconds.     Findings: No bruising, erythema, lesion or rash.  Neurological:     General: No focal deficit present.     Mental Status: She is alert and oriented to person, place, and time. Mental status is at baseline.     Sensory: No sensory deficit.     Motor: No weakness.     Coordination: Coordination normal.     Gait: Gait normal.     Deep Tendon Reflexes: Reflexes normal.      Assessment & Plan:   See Encounters Tab for problem based charting.  Patient discussed with Dr. Daryll Drown

## 2019-10-07 NOTE — Progress Notes (Signed)
ICD Remote  

## 2019-10-07 NOTE — Assessment & Plan Note (Signed)
PHQ-9 score 6 today. Patient notes that she is doing well. She has not seen Miquel Dunn of behavioral health but will schedule an appointment as needed.   Plan:  - Continue to monitor and encourage follow up with behavioral health therapy

## 2019-10-07 NOTE — Patient Instructions (Addendum)
Susan Peck,  It was a pleasure seeing you in clinic today. Today, we discussed:   Back pain: At this time, please start taking cyclobenzaprine 5mg  three times daily as needed. If your symptoms persist, please let us know.   Diabetes: Your A1c was 5.9 at this visit. Congratulations! Please continue to take Xultophy 12U daily as prescribed.  Thyroid: Please schedule an appointment with the endocrinologist to follow up on your goiter.   Please continue to take all medications as prescribed.  Please contact us if you have any questions or concerns.  Thank you!

## 2019-10-07 NOTE — Assessment & Plan Note (Signed)
Patient follows closely with EP and HF clinic. She is on Lasix 80mg  qAM and 40mg  qPM, eplerenone, entresto and carvedilol. Patient notes compliance with medications. She denies any chest pain, palpitations, shortness of breath, lower extremity swelling, or significant weight gain. On examination, patient appears to be euvolemic.   Plan:  - Continue Lasix 80mg  qAM and 40mg  qPM - Continue Entresto 97-103mg  twice daily - Continue Eplerenone 25mg  daily - Continue Carvedilol 25mg  twice daily  - Follow up with EP and HF clinic

## 2019-10-07 NOTE — Assessment & Plan Note (Signed)
Patient's HbA1c 5.9 today. She is compliant with her medication. Her AM CBG readings following breakfast have been <170. Patient denies any hypoglycemic episodes and notes her lowest CBG to be in 70s.   Plan: - Continue Xultophy 12U daily - Repeat A1c in 3 months

## 2019-10-07 NOTE — Assessment & Plan Note (Signed)
Patient notes compliance with CPAP nightly. She uses her albuterol inhaler as needed.   Plan:  - Continue CPAP - Continue albuterol 1-2 puff q6h prn

## 2019-10-08 ENCOUNTER — Other Ambulatory Visit: Payer: Self-pay | Admitting: Dietician

## 2019-10-08 DIAGNOSIS — E1165 Type 2 diabetes mellitus with hyperglycemia: Secondary | ICD-10-CM

## 2019-10-08 MED ORDER — XULTOPHY 100-3.6 UNIT-MG/ML ~~LOC~~ SOPN: 12.0000 [IU]/d | PEN_INJECTOR | Freq: Every day | SUBCUTANEOUS | 11 refills | Status: DC

## 2019-10-08 NOTE — Progress Notes (Signed)
Internal Medicine Clinic Attending  Case discussed with Dr. Aslam at the time of the visit.  We reviewed the resident's history and exam and pertinent patient test results.  I agree with the assessment, diagnosis, and plan of care documented in the resident's note.  

## 2019-10-08 NOTE — Telephone Encounter (Signed)
Happy with her A1C. Refill requested.

## 2019-10-10 NOTE — Progress Notes (Signed)
EPIC Encounter for ICM Monitoring  Patient Name: Susan Peck is a 54 y.o. female Date: 10/10/2019 Primary Care Physican: Harvie Heck, MD Primary Cardiologist:Bensimhon Electrophysiologist:Klein Bi-V Pacing:99.2% 10/10/2019 Weight:184 lbs(baseline)  Spoke with patient and she had some fluid weight gain last week.  She self adjusted Furosemide and symptoms resolved.  She is feeling fine at this time.    OptivolThoracic impedancenormal.  Prescribed dosage:Furosemide40 mg take 2 tablets (80 mg total) every morning and 1 tablet (40 mg total) every evening.  Recommendations:No changes and encouraged to call if experiencing any fluid symptoms.  Follow-up plan: ICM clinic phone appointment on4/19/2021.  91 day device clinic remote transmission 01/05/2020.  Office appt 12/29/2019 with Dr. Haroldine Laws.    Copy of ICM check sent to Dr. Caryl Comes.   3 month ICM trend: 10/06/2019    1 Year ICM trend:       [name], RN 10/10/2019 2:39 PM

## 2019-10-14 ENCOUNTER — Other Ambulatory Visit: Payer: Self-pay | Admitting: Internal Medicine

## 2019-10-21 DIAGNOSIS — H401131 Primary open-angle glaucoma, bilateral, mild stage: Secondary | ICD-10-CM | POA: Diagnosis not present

## 2019-10-21 DIAGNOSIS — G4733 Obstructive sleep apnea (adult) (pediatric): Secondary | ICD-10-CM | POA: Diagnosis not present

## 2019-11-04 DIAGNOSIS — H401131 Primary open-angle glaucoma, bilateral, mild stage: Secondary | ICD-10-CM | POA: Diagnosis not present

## 2019-11-10 ENCOUNTER — Other Ambulatory Visit: Payer: Self-pay | Admitting: Internal Medicine

## 2019-11-10 DIAGNOSIS — Z1231 Encounter for screening mammogram for malignant neoplasm of breast: Secondary | ICD-10-CM

## 2019-11-11 ENCOUNTER — Other Ambulatory Visit: Payer: Self-pay | Admitting: Internal Medicine

## 2019-11-12 NOTE — Progress Notes (Signed)
No ICM remote transmission received for 11/10/2019 and next ICM transmission scheduled for 12/01/2019   

## 2019-11-21 ENCOUNTER — Telehealth: Payer: Self-pay | Admitting: Dietician

## 2019-11-21 NOTE — Telephone Encounter (Signed)
Her meter is not working, she put a new battery in an it still does not work. She agreed to pick up a sample meter here today.

## 2019-12-01 ENCOUNTER — Ambulatory Visit (INDEPENDENT_AMBULATORY_CARE_PROVIDER_SITE_OTHER): Payer: Medicare Other

## 2019-12-01 DIAGNOSIS — I5022 Chronic systolic (congestive) heart failure: Secondary | ICD-10-CM | POA: Diagnosis not present

## 2019-12-01 DIAGNOSIS — Z95810 Presence of automatic (implantable) cardiac defibrillator: Secondary | ICD-10-CM | POA: Diagnosis not present

## 2019-12-03 ENCOUNTER — Telehealth: Payer: Self-pay

## 2019-12-03 NOTE — Progress Notes (Signed)
EPIC Encounter for ICM Monitoring  Patient Name: Susan Peck is a 54 y.o. female Date: 12/03/2019 Primary Care Physican: Harvie Heck, MD Primary Cardiologist:Bensimhon Electrophysiologist:Klein Bi-V Pacing:99.4% 10/10/2019 Weight:184 lbs(baseline)  Attempted call to patient and unable to reach.  Left detailed message per DPR regarding transmission. Transmission reviewed.   OptivolThoracic impedancenormal.  Prescribed dosage:Furosemide40 mg take 2 tablets (80 mg total) every morning and 1 tablet (40 mg total) every evening.  Recommendations:  Left voice mail with ICM number and encouraged to call if experiencing any fluid symptoms.  Follow-up plan: ICM clinic phone appointment on6/15/2021.  91 day device clinic remote transmission 01/05/2020.  Office appt 12/29/2019 with Dr. Haroldine Laws.    Copy of ICM check sent to Dr. Caryl Comes.   3 month ICM trend: 12/01/2019    1 Year ICM trend:       [name], RN 12/03/2019 11:38 AM

## 2019-12-03 NOTE — Telephone Encounter (Signed)
Remote ICM transmission received.  Attempted call to patient regarding ICM remote transmission and left detailed message per DPR.  Advised to return call for any fluid symptoms or questions.  

## 2019-12-04 ENCOUNTER — Other Ambulatory Visit: Payer: Self-pay | Admitting: Internal Medicine

## 2019-12-29 ENCOUNTER — Ambulatory Visit (HOSPITAL_COMMUNITY)
Admission: RE | Admit: 2019-12-29 | Discharge: 2019-12-29 | Disposition: A | Discharge status: Home / Self Care | Payer: Medicare Other | Source: Ambulatory Visit | Attending: Internal Medicine | Admitting: Internal Medicine

## 2019-12-29 ENCOUNTER — Other Ambulatory Visit: Payer: Self-pay

## 2019-12-29 ENCOUNTER — Encounter (HOSPITAL_COMMUNITY): Payer: Self-pay | Admitting: Internal Medicine

## 2019-12-29 VITALS — BP 160/100 | HR 78 | Wt 190.8 lb

## 2019-12-29 DIAGNOSIS — Z833 Family history of diabetes mellitus: Secondary | ICD-10-CM | POA: Diagnosis not present

## 2019-12-29 DIAGNOSIS — Z8249 Family history of ischemic heart disease and other diseases of the circulatory system: Secondary | ICD-10-CM | POA: Insufficient documentation

## 2019-12-29 DIAGNOSIS — Z803 Family history of malignant neoplasm of breast: Secondary | ICD-10-CM | POA: Insufficient documentation

## 2019-12-29 DIAGNOSIS — Z7982 Long term (current) use of aspirin: Secondary | ICD-10-CM | POA: Insufficient documentation

## 2019-12-29 DIAGNOSIS — I110 Hypertensive heart disease with heart failure: Secondary | ICD-10-CM | POA: Insufficient documentation

## 2019-12-29 DIAGNOSIS — Z79899 Other long term (current) drug therapy: Secondary | ICD-10-CM | POA: Diagnosis not present

## 2019-12-29 DIAGNOSIS — I5022 Chronic systolic (congestive) heart failure: Secondary | ICD-10-CM | POA: Diagnosis not present

## 2019-12-29 DIAGNOSIS — G4733 Obstructive sleep apnea (adult) (pediatric): Secondary | ICD-10-CM | POA: Diagnosis not present

## 2019-12-29 DIAGNOSIS — Z90721 Acquired absence of ovaries, unilateral: Secondary | ICD-10-CM | POA: Insufficient documentation

## 2019-12-29 DIAGNOSIS — I428 Other cardiomyopathies: Secondary | ICD-10-CM | POA: Diagnosis not present

## 2019-12-29 DIAGNOSIS — I447 Left bundle-branch block, unspecified: Secondary | ICD-10-CM | POA: Diagnosis not present

## 2019-12-29 DIAGNOSIS — Z95810 Presence of automatic (implantable) cardiac defibrillator: Secondary | ICD-10-CM | POA: Diagnosis not present

## 2019-12-29 DIAGNOSIS — I10 Essential (primary) hypertension: Secondary | ICD-10-CM

## 2019-12-29 DIAGNOSIS — J45909 Unspecified asthma, uncomplicated: Secondary | ICD-10-CM | POA: Diagnosis not present

## 2019-12-29 DIAGNOSIS — Z8349 Family history of other endocrine, nutritional and metabolic diseases: Secondary | ICD-10-CM | POA: Diagnosis not present

## 2019-12-29 DIAGNOSIS — E042 Nontoxic multinodular goiter: Secondary | ICD-10-CM | POA: Insufficient documentation

## 2019-12-29 DIAGNOSIS — Z794 Long term (current) use of insulin: Secondary | ICD-10-CM | POA: Diagnosis not present

## 2019-12-29 DIAGNOSIS — Z91013 Allergy to seafood: Secondary | ICD-10-CM | POA: Diagnosis not present

## 2019-12-29 DIAGNOSIS — E119 Type 2 diabetes mellitus without complications: Secondary | ICD-10-CM | POA: Insufficient documentation

## 2019-12-29 LAB — BASIC METABOLIC PANEL
Anion gap: 9 (ref 5–15)
BUN: 11 mg/dL (ref 6–20)
CO2: 28 mmol/L (ref 22–32)
Calcium: 9.3 mg/dL (ref 8.9–10.3)
Chloride: 104 mmol/L (ref 98–111)
Creatinine, Ser: 0.55 mg/dL (ref 0.44–1.00)
GFR calc Af Amer: >60 mL/min (ref >60)
GFR calc non Af Amer: >60 mL/min (ref >60)
Glucose, Bld: 127 mg/dL — ABNORMAL HIGH (ref 70–99)
Potassium: 3.6 mmol/L (ref 3.5–5.1)
Sodium: 141 mmol/L (ref 135–145)

## 2019-12-29 LAB — CBC
HCT: 40.9 % (ref 36.0–46.0)
Hemoglobin: 13.7 g/dL (ref 12.0–15.0)
MCH: 27.6 pg (ref 26.0–34.0)
MCHC: 33.5 g/dL (ref 30.0–36.0)
MCV: 82.3 fL (ref 80.0–100.0)
Platelets: 238 10*3/uL (ref 150–400)
RBC: 4.97 MIL/uL (ref 3.87–5.11)
RDW: 12.5 % (ref 11.5–15.5)
WBC: 6.3 10*3/uL (ref 4.0–10.5)
nRBC: 0 % (ref 0.0–0.2)

## 2019-12-29 LAB — BRAIN NATRIURETIC PEPTIDE: B Natriuretic Peptide: 51.3 pg/mL (ref 0.0–100.0)

## 2019-12-29 NOTE — Addendum Note (Signed)
Encounter addended by: Jolaine Artist, MD on: 12/29/2019 2:43 PM  Actions taken: Charge Capture section accepted

## 2019-12-29 NOTE — Patient Instructions (Signed)
Get a home BP machine and check your BP 2 times a day  Labs done today, your results will be available in MyChart, we will contact you for abnormal readings.  Your physician has requested that you have an echocardiogram. Echocardiography is a painless test that uses sound waves to create images of your heart. It provides your doctor with information about the size and shape of your heart and how well your heart's chambers and valves are working. This procedure takes approximately one hour. There are no restrictions for this procedure.  Please call our office in February 2022 to schedule your follow up appointment  If you have any questions or concerns before your next appointment please send [place] a message through [place] or call our office at [phone].    TO LEAVE A MESSAGE FOR THE NURSE SELECT OPTION 2, PLEASE LEAVE A MESSAGE INCLUDING: . YOUR NAME . DATE OF BIRTH . CALL BACK NUMBER . REASON FOR CALL**this is important as we prioritize the call backs  [place] AS LONG AS YOU CALL BEFORE 4:00 PM  At the [facility], you and your health needs are our priority. As part of our continuing mission to provide you with exceptional heart care, we have created designated Provider Care Teams. These Care Teams include your primary Cardiologist (physician) and Advanced Practice Providers (APPs- Physician Assistants and Nurse Practitioners) who all work together to provide you with the care you need, when you need it.   You may see any of the following providers on your designated Care Team at your next follow up: Marland Kitchen Dr Glori Bickers . Dr Loralie Champagne . Darrick Grinder, NP . Lyda Jester, PA . Audry Riles, PharmD   Please be sure to bring in all your medications bottles to every appointment.

## 2019-12-29 NOTE — Progress Notes (Signed)
Advanced Heart Failure Clinic Note  Date:  12/29/2019   ID:  SARAYU PREVOST, DOB 05/08/1966, MRN [id number]  Location: Home  Provider location: [facility] Type of Visit: Established patient  PCP:  Harvie Heck, MD  Cardiologist:  No primary care provider on file. Primary HF: Kellen Hover  Chief Complaint: Heart Failure follow-up   History of Present Illness:  Romi is a 54 year old woman with systolic heart failure, HTN, GERD,  multinodular goiter and asthma.   Diagnosed with systolic HF in 8546 with EF 25-30% Cath with normal cors. Felt to be LBBB related and received CRT-D with improvement in EF. Last echo 3/19 EF 45-50%  I had a telehealth call with her on 08/28/19. Prior to that, we had not seen her since 2019. Recently has had problems with diaphragmatic stim and LV lead pacing vector adjusted.   Here for routine f/u. Feeling pretty good. Under a lot of stress. Tired a lot. Struggling with restless legs and unable to sleep. Mild DOE. No edema, orthopnea or PND. Still with back pain.   ICD interrogated: Volume status ok. Activity level ok. No AF/VT. 100% v-pacing Personally reviewed  ECG: NSR v-pacing 80 Personally reviewed   Cardiac studies:  Echo 3/19 EF improved to ~45-50%., GLS -14.8  04/03/2011  ECHO EF 25-30% 06/26/2012 ECHO EF 30-35%   11/18/12 ECHO EF 55% Inferior wall mildly hypokinetic with septal HK 11/14 Cardiac MRI  EF 52% No LGE. LBBB with dyssynergy 6/16: ECHO EF 35-40% 09/07/2015: ECHO EF 30-35%   CPX 11/20/2014  Peak VO2: 16.1 (76.8% predicted peak VO2) - when corrected to ideal BW pVO2 is 22.9 VE/VCO2 slope: 28.5 OUES: 1.59 Peak RER: 1.18  LHC/RHC 09/15/2015  RA = 2 RV = 22/1/2 PA = 27/8 (19) PCW = 8 Fick cardiac output/index = 5.22/2.8 PVR = 2.1 Ao sat = 98% PA sat = 70%, 71% Assessment: 1. Normal coronary arteries 2. Normal hemodynamics  3. LVEF 35-40% with global HK due to   [place] denies  symptoms worrisome for COVID 19.   Past Medical History:  Diagnosis Date  . AICD (automatic cardioverter/defibrillator) present 12/13/2016  . Anxiety    no meds  . Asthma   . CHF (congestive heart failure) ([place])   . Depression    no meds  . Diabetes mellitus    recent dx 11/17/11 - started med 11/18/11 type 2  . GERD (gastroesophageal reflux disease)   . Goiter 07/27/2011   non-neoplastic goiter - fine needle aspiration - benign sees dr Dwyane Dee for  . Heart murmur    dx 2 yrs ago per pt  . Herpes genitalis in women   . Hypertension   . IBS (irritable bowel syndrome)    tx with diet per pt  . Low back pain    history  . Obesity   . Seasonal allergies   . Shortness of breath    occasional - exercise induced  . Sleep apnea    cpap broken  . Systolic heart failure    May 2011 EF 35-40%, 04/03/11 EF 25-30%, 06/27/11 EF 30-35%   Past Surgical History:  Procedure Laterality Date  . ABDOMINAL HYSTERECTOMY    . BIV ICD INSERTION CRT-D N/A 12/13/2016   Procedure: BiV ICD Insertion CRT-D;  Surgeon: Deboraha Sprang, MD;  Location: [facility];  Service: Cardiovascular;  Laterality: N/A;  . cardiac catherization  2004   [place], [place] - Dr Lynnell Jude  .  CARDIAC CATHETERIZATION    . CARDIAC CATHETERIZATION N/A 09/15/2015   Procedure: Right/Left Heart Cath and Coronary Angiography;  Surgeon: Jolaine Artist, MD;  Location: [facility];  Service: Cardiovascular;  Laterality: N/A;  . COLONOSCOPY WITH PROPOFOL N/A 03/26/2015   Procedure: COLONOSCOPY WITH PROPOFOL;  Surgeon: Carol Ada, MD;  Location: [facility];  Service: Endoscopy;  Laterality: N/A;  . colonscopy    . CYSTOSCOPY  11/23/2011   Procedure: CYSTOSCOPY;  Surgeon: Jolayne Haines, MD;  Location: [facility];  Service: Gynecology;  Laterality: N/A;  . DIAGNOSTIC LAPAROSCOPY     of pelvis  . DILATION AND CURETTAGE OF UTERUS  12/2006,  10/2005   hysteroscopy surgery x 2  . INSERTION OF ICD  12/13/2016   BIV  . LAPAROSCOPIC  ASSISTED VAGINAL HYSTERECTOMY  11/23/2011   Procedure: LAPAROSCOPIC ASSISTED VAGINAL HYSTERECTOMY;  Surgeon: Jolayne Haines, MD;  Location: [facility];  Service: Gynecology;  Laterality: N/A;  . NOVASURE ABLATION     10/2005  . SALPINGOOPHORECTOMY  11/23/2011   Procedure: SALPINGO OOPHERECTOMY;  Surgeon: Jolayne Haines, MD;  Location: [facility];  Service: Gynecology;  Laterality: Bilateral;  . SVD     x 3  . TOOTH EXTRACTION N/A 04/05/2017   Procedure: DENTAL EXTRACTIONS OF TEETH FIVE, SEVEN, SIXTEEN, SEVENTEEN;  Surgeon: Diona Browner, DDS;  Location: [facility];  Service: Oral Surgery;  Laterality: N/A;  . TUBAL LIGATION    . WISDOM TOOTH EXTRACTION       Current Outpatient Medications  Medication Sig Dispense Refill  . aspirin 81 MG chewable tablet Chew 81 mg by mouth daily.     Marland Kitchen atorvastatin (LIPITOR) 40 MG tablet Take 1 tablet (40 mg total) by mouth daily. 30 tablet 11  . carvedilol (COREG) 25 MG tablet Take 1 tablet (25 mg total) by mouth 2 (two) times daily with a meal. 60 tablet 11  . eplerenone (INSPRA) 25 MG tablet Take 0.5 tablets (12.5 mg total) by mouth daily. 45 tablet 3  . fluticasone (FLONASE) 50 MCG/ACT nasal spray PLACE 1 SPRAY INTO BOTH NOSTRILS 2 (TWO) TIMES DAILY. 32 mL 1  . furosemide (LASIX) 40 MG tablet TAKE 2 TABS EVERY MORNING AND 1 TAB EVERY EVENING. 270 tablet 0  . glucose blood (ONETOUCH VERIO) test strip Insulin dependent. Check blood sugar up to 3 times daily. diag code E11.65 100 each 12  . Insulin Degludec-Liraglutide (XULTOPHY) 100-3.6 UNIT-MG/ML SOPN Inject 12 Units/day into the skin at bedtime. 15 pen 11  . Insulin Pen Needle 32G X 4 MM MISC Use to inject xultophy daily 100 each 5  . Lancet Devices (ONETOUCH DELICA PLUS LANCING) MISC USE AS DIRECTED TO CHECK BLOOD SUGAR 1 each 2  . mupirocin cream (BACTROBAN) 2 % Apply 1 application topically 2 (two) times daily. 15 g 1  . ONETOUCH DELICA LANCETS FINE MISC Insulin dependent. Check blood sugar up to 3 times daily. diag  code E11.65 100 each 12  . PATADAY 0.2 % SOLN Place 1 drop into both eyes 2 (two) times daily.   4  . polyethylene glycol (MIRALAX / GLYCOLAX) packet Take 17 g by mouth daily. 14 each 0  . sacubitril-valsartan (ENTRESTO) 97-103 MG Take 1 tablet by mouth 2 (two) times daily. 60 tablet 11  . VYZULTA 0.024 % SOLN SMARTSIG:1 Drop(s) In Eye(s) Every Evening     No current facility-administered medications for this encounter.    Allergies:   Shrimp [shellfish allergy]   Social History:  The  patient  reports that she has never smoked. She has never used smokeless tobacco. She reports previous alcohol use. She reports that she does not use drugs.   Family History:  The patient's family history includes Breast cancer in her maternal aunt and mother; Diabetes in her maternal grandfather, maternal grandmother, paternal grandfather, and paternal grandmother; Heart disease in her maternal aunt and maternal aunt; Hypertension in her maternal grandmother and paternal grandfather; Thyroid disease in her mother.   ROS:  Please see the history of present illness.   All other systems are personally reviewed and negative.   Vitals:   12/29/19 1119  BP: (!) 160/100  Pulse: 78  SpO2: 96%  Weight: 86.5 kg (190 lb 12.8 oz)    Exam:   General:  Well appearing. No resp difficulty HEENT: normal Neck: supple. no JVD. Carotids 2+ bilat; no bruits. No lymphadenopathy or thryomegaly appreciated. Cor: PMI nondisplaced. Regular rate & rhythm. No rubs, gallops or murmurs. Lungs: clear Abdomen: obese  soft, nontender, nondistended. No hepatosplenomegaly. No bruits or masses. Good bowel sounds. Extremities: no cyanosis, clubbing, rash, edema Neuro: alert & orientedx3, cranial nerves grossly intact. moves all 4 extremities w/o difficulty. Affect pleasant  Recent Labs: 04/15/2019: BUN 14; Creatinine, Ser 0.71; Potassium 4.1; Sodium 140; TSH 1.100  Personally reviewed   Wt Readings from Last 3 Encounters:  12/29/19  86.5 kg (190 lb 12.8 oz)  10/07/19 86.4 kg (190 lb 8 oz)  06/24/19 84.4 kg (186 lb)      ASSESSMENT AND PLAN:  1. Chronic systolic HF: Nonischemic cardiomyopathy. - - - - 08/2015 ECHO EF 35-40% - Echo 2/19 LVEF 45-50%.s/p CRT-P  - NYHA I-II symptoms  - Volume status stable on exam and by optivol - Continue lasix 80/40 - Continue inspra 12.5 mg daily. ( Intolerant to spiro with painful gynecomastia) - Continue coreg 25 mg BID.  - Intolerant to Bidil with severe headaches.  - Continue entresto 97/103 mg BID.  - Failed Jardiance due to nausea - Needs repeat echo and labs  - ICD interrogated personally. No VT/AF. 100% biv pacing. Activity level 2-3h/day. Optivol ok. Personally reviewed 2. Chest pain - Improved LHC 08/2015 with normal cors.   3. HTN:  - BP very high in setting of emotional stress, pain and missing a few doses of her medications - stressed need to take meds today. - advised her to get BP cuff and take BP 2x/day with goal of getting SBP < 140.  - f/u PCP  - If BP remains uncontrolled refer to HTN program with Dr. Oval Linsey 4. OSA:  - Encouraged nightly compliance.  5. DM II: followed by  Endocrinologist.  - Failed Jardiance    Signed, Glori Bickers, MD  12/29/2019 11:28 AM  Advanced [facility] Failure [place] [address] [phone] (office) [phone] (fax)

## 2020-01-05 ENCOUNTER — Ambulatory Visit (INDEPENDENT_AMBULATORY_CARE_PROVIDER_SITE_OTHER): Payer: Medicare Other | Admitting: *Deleted

## 2020-01-05 DIAGNOSIS — I428 Other cardiomyopathies: Secondary | ICD-10-CM | POA: Diagnosis not present

## 2020-01-05 DIAGNOSIS — I5022 Chronic systolic (congestive) heart failure: Secondary | ICD-10-CM

## 2020-01-06 ENCOUNTER — Ambulatory Visit (INDEPENDENT_AMBULATORY_CARE_PROVIDER_SITE_OTHER): Payer: Medicare Other

## 2020-01-06 DIAGNOSIS — I5022 Chronic systolic (congestive) heart failure: Secondary | ICD-10-CM | POA: Diagnosis not present

## 2020-01-06 DIAGNOSIS — Z95810 Presence of automatic (implantable) cardiac defibrillator: Secondary | ICD-10-CM

## 2020-01-06 LAB — CUP PACEART REMOTE DEVICE CHECK
Battery Remaining Longevity: 48 mo
Battery Voltage: 2.96 V
Brady Statistic AP VP Percent: 1.49 %
Brady Statistic AP VS Percent: 0.01 %
Brady Statistic AS VP Percent: 98.43 %
Brady Statistic AS VS Percent: 0.08 %
Brady Statistic RA Percent Paced: 1.48 %
Brady Statistic RV Percent Paced: 98.05 %
Date Time Interrogation Session: 20210615032206
HighPow Impedance: 64 Ohm
Implantable Lead Implant Date: 20180524
Implantable Lead Implant Date: 20180524
Implantable Lead Implant Date: 20180524
Implantable Lead Location: 753858
Implantable Lead Location: 753859
Implantable Lead Location: 753860
Implantable Lead Model: 4398
Implantable Lead Model: 5076
Implantable Pulse Generator Implant Date: 20180524
Lead Channel Impedance Value: 141.867
Lead Channel Impedance Value: 141.867
Lead Channel Impedance Value: 153.152
Lead Channel Impedance Value: 153.152
Lead Channel Impedance Value: 165.029
Lead Channel Impedance Value: 266 Ohm
Lead Channel Impedance Value: 266 Ohm
Lead Channel Impedance Value: 304 Ohm
Lead Channel Impedance Value: 304 Ohm
Lead Channel Impedance Value: 361 Ohm
Lead Channel Impedance Value: 399 Ohm
Lead Channel Impedance Value: 456 Ohm
Lead Channel Impedance Value: 475 Ohm
Lead Channel Impedance Value: 475 Ohm
Lead Channel Impedance Value: 532 Ohm
Lead Channel Impedance Value: 532 Ohm
Lead Channel Impedance Value: 570 Ohm
Lead Channel Impedance Value: 627 Ohm
Lead Channel Pacing Threshold Amplitude: 0.375 V
Lead Channel Pacing Threshold Amplitude: 0.5 V
Lead Channel Pacing Threshold Amplitude: 0.625 V
Lead Channel Pacing Threshold Pulse Width: 0.4 ms
Lead Channel Pacing Threshold Pulse Width: 0.4 ms
Lead Channel Pacing Threshold Pulse Width: 1 ms
Lead Channel Sensing Intrinsic Amplitude: 26.75 mV
Lead Channel Sensing Intrinsic Amplitude: 26.75 mV
Lead Channel Sensing Intrinsic Amplitude: 3.875 mV
Lead Channel Sensing Intrinsic Amplitude: 3.875 mV
Lead Channel Setting Pacing Amplitude: 0.75 V
Lead Channel Setting Pacing Amplitude: 1.5 V
Lead Channel Setting Pacing Amplitude: 2.5 V
Lead Channel Setting Pacing Pulse Width: 0.4 ms
Lead Channel Setting Pacing Pulse Width: 1 ms
Lead Channel Setting Sensing Sensitivity: 0.3 mV

## 2020-01-07 NOTE — Progress Notes (Signed)
Remote ICD transmission.   

## 2020-01-08 ENCOUNTER — Ambulatory Visit (HOSPITAL_COMMUNITY): Payer: Medicare Other

## 2020-01-09 NOTE — Progress Notes (Signed)
EPIC Encounter for ICM Monitoring  Patient Name: Susan Peck is a 54 y.o. female Date: 01/09/2020 Primary Care Physican: Harvie Heck, MD Primary Cardiologist:Bensimhon Electrophysiologist:Klein Bi-V Pacing:98% 6/7/2021Office Weight:190  Transmission reviewed.   OptivolThoracic impedancenormal.  Prescribed dosage:Furosemide40 mg take 2 tablets (80 mg total) every morning and 1 tablet (40 mg total) every evening.  Recommendations:  None  Follow-up plan: ICM clinic phone appointment on7/19/2021.  91 day device clinic remote transmission 04/05/2020.   Copy of ICM check sent to [place].   3 month ICM trend: 01/06/2020    1 Year ICM trend:       [name], RN 01/09/2020 4:17 PM

## 2020-01-15 ENCOUNTER — Ambulatory Visit (HOSPITAL_COMMUNITY)
Admission: RE | Admit: 2020-01-15 | Discharge: 2020-01-15 | Disposition: A | Discharge status: Home / Self Care | Payer: Medicare Other | Source: Ambulatory Visit | Attending: Internal Medicine | Admitting: Internal Medicine

## 2020-01-15 ENCOUNTER — Other Ambulatory Visit: Payer: Self-pay

## 2020-01-15 DIAGNOSIS — I110 Hypertensive heart disease with heart failure: Secondary | ICD-10-CM | POA: Diagnosis not present

## 2020-01-15 DIAGNOSIS — E119 Type 2 diabetes mellitus without complications: Secondary | ICD-10-CM | POA: Insufficient documentation

## 2020-01-15 DIAGNOSIS — Z95810 Presence of automatic (implantable) cardiac defibrillator: Secondary | ICD-10-CM | POA: Insufficient documentation

## 2020-01-15 DIAGNOSIS — I5022 Chronic systolic (congestive) heart failure: Secondary | ICD-10-CM | POA: Diagnosis not present

## 2020-01-15 NOTE — Progress Notes (Signed)
°  Echocardiogram 2D Echocardiogram has been performed.  Jannett Celestine 01/15/2020, 10:05 AM

## 2020-01-23 ENCOUNTER — Other Ambulatory Visit: Payer: Self-pay

## 2020-01-23 ENCOUNTER — Ambulatory Visit (INDEPENDENT_AMBULATORY_CARE_PROVIDER_SITE_OTHER): Payer: Medicare Other | Admitting: Internal Medicine

## 2020-01-23 ENCOUNTER — Encounter: Payer: Self-pay | Admitting: Internal Medicine

## 2020-01-23 VITALS — BP 115/65 | HR 72 | Temp 98.2°F | Ht 64.0 in | Wt 193.3 lb

## 2020-01-23 DIAGNOSIS — E1165 Type 2 diabetes mellitus with hyperglycemia: Secondary | ICD-10-CM | POA: Diagnosis not present

## 2020-01-23 DIAGNOSIS — E049 Nontoxic goiter, unspecified: Secondary | ICD-10-CM

## 2020-01-23 DIAGNOSIS — E1140 Type 2 diabetes mellitus with diabetic neuropathy, unspecified: Secondary | ICD-10-CM | POA: Diagnosis not present

## 2020-01-23 DIAGNOSIS — I10 Essential (primary) hypertension: Secondary | ICD-10-CM

## 2020-01-23 DIAGNOSIS — G2581 Restless legs syndrome: Secondary | ICD-10-CM

## 2020-01-23 DIAGNOSIS — G629 Polyneuropathy, unspecified: Secondary | ICD-10-CM

## 2020-01-23 LAB — POCT GLYCOSYLATED HEMOGLOBIN (HGB A1C): Hemoglobin A1C: 6.7 % — AB (ref 4.0–5.6)

## 2020-01-23 LAB — GLUCOSE, CAPILLARY: Glucose-Capillary: 154 mg/dL — ABNORMAL HIGH (ref 70–99)

## 2020-01-23 MED ORDER — DICLOFENAC SODIUM 1 % EX GEL: 2.0000 g | Freq: Four times a day (QID) | CUTANEOUS | 1 refills | Status: DC

## 2020-01-23 MED ORDER — PREGABALIN 75 MG PO CAPS: 75.0000 mg | ORAL_CAPSULE | Freq: Three times a day (TID) | ORAL | 1 refills | Status: DC

## 2020-01-23 NOTE — Progress Notes (Signed)
° °  CC: thyroid goiter f/u  HPI:  [name] is a 54 y.o. female with PMHx as listed below presenting for follow up of her thyroid goiter. She has a history of multinodular goiter (benign) and asymptomatic. She denies any acute concerns today. Please see problem based charting for further assessment and plan.  Past Medical History:  Diagnosis Date   AICD (automatic cardioverter/defibrillator) present 12/13/2016   Anxiety    no meds   Asthma    CHF (congestive heart failure) ([place])    Depression    no meds   Diabetes mellitus    recent dx 11/17/11 - started med 11/18/11 type 2   GERD (gastroesophageal reflux disease)    Goiter 07/27/2011   non-neoplastic goiter - fine needle aspiration - benign sees dr Dwyane Dee for   Heart murmur    dx 2 yrs ago per pt   Herpes genitalis in women    Hypertension    IBS (irritable bowel syndrome)    tx with diet per pt   Low back pain    history   Obesity    Seasonal allergies    Shortness of breath    occasional - exercise induced   Sleep apnea    cpap broken   Systolic heart failure    May 2011 EF 35-40%, 04/03/11 EF 25-30%, 06/27/11 EF 30-35%   Review of Systems:  Negative except as stated in HPI.  Physical Exam:  Vitals:   01/23/20 0927  Weight: 193 lb 4.8 oz (87.7 kg)  Height: 5\' 4"  (1.626 m)   Physical Exam  Constitutional: Appears well-developed and well-nourished. No distress.  HENT:  Normocephalic and atraumatic. MMM, EOMI nl, conjunctivae nl, thyroid wnl Cardiovascular: Normal rate, regular rhythm, S1 and S2 normal, no murmurs, rubs, gallops, distal pulses intact Respiratory: Effort normal and breath sounds normal. No respiratory distress. No wheezes, rales, rhonchi GI: Soft. Bowel sounds are normal. No distension. There is no tenderness.  Musculoskeletal: Normal bulk and tone, no edema appreciated Neurological: I alert and oriented x4, no apparent focal deficits noted Skin: Not diaphoretic. No erythema,  rash, lesions noted Psychiatric: Normal mood and affect. Behavior is normal. Judgment and thought content normal.    Assessment & Plan:   See Encounters Tab for problem based charting.  Patient discussed with Dr. Evette Doffing

## 2020-01-23 NOTE — Assessment & Plan Note (Addendum)
Blood pressure 115/65 today.  Patient is compliant with her heart failure medications including carvedilol, Entresto, Lasix, eplerenone.  She is followed by heart failure and EP clinic.  Plan Continue current regimen

## 2020-01-23 NOTE — Assessment & Plan Note (Addendum)
Patient has a history of multinodular goiter.  She previously used to follow with Dr. Dwyane Dee; however, has not been able to do so recently.  She notes that she feels food getting stuck in her throat recently.  She believes is secondary to her thyroid.  Denies any other symptoms such as cold or hot intolerance, skin changes, fatigue.  TSH within normal limits.  On examination, no significant goiter was palpable.  However, patient has not had thyroid ultrasound recently.  Plan Thyroid ultrasound  Encouraged to follow-up with endocrinologist

## 2020-01-23 NOTE — Patient Instructions (Signed)
Ms. Sundquist,   It was a pleasure seeing you in clinic. Today we discussed:   Thyroid: I check your thyroid levels today and will order a thyroid ultrasound as well.  Please schedule this as soon as possible.  Please follow-up with the endocrinologist at your earliest convenience.  Back pain: You may try Voltaren gel 4 times daily as needed.  I will also prescribe pregabalin.  I am also going to get some labs to evaluate for the nerve pain.  I will contact you with any abnormal results.  If you have any questions or concerns, please call our clinic at [phone] between 9am-5pm and after hours call [phone] and ask for the internal medicine resident on call. If you feel you are having a medical emergency please call 911.   Thank you, we look forward to helping you remain healthy!    If you have not already done so, I recommend getting the COVID-19 vaccine.  To schedule an appointment for a COVID vaccine or be added to the vaccine wait list: Go to [email or web address]   OR Go to [email or web address]                  OR Call [phone]                                     OR Call [phone] and select Option 2

## 2020-01-24 LAB — TSH: TSH: 1.95 u[IU]/mL (ref 0.450–4.500)

## 2020-01-24 LAB — VITAMIN B12: Vitamin B-12: 518 pg/mL (ref 232–1245)

## 2020-01-25 NOTE — Assessment & Plan Note (Addendum)
Patient with ongoing chronic lower back pain radiating to bilateral thighs and shins endorses neuropathic pain in feet as well.. Prior lumbar radiograph in 2020 with osteoarthritic changes of lower lumbar spine.  She reports minimal relief with gabapentin, ropinirole and cyclobenzaprine.  Symptoms and physical exam remain unchanged from prior examination signs of spinal cord involvement.  Minimal tenderness palpation lumbar paraspinal muscles but no motor or sensory deficits on examination.  Vitamin B12 and TSH WNL. her A1c is slightly elevated from prior suspect that this may be contributing to her neuropathic pain.  Plan Lyrica 75 mg 3 times daily Continue to monitor

## 2020-01-25 NOTE — Assessment & Plan Note (Signed)
A1c 6.7 up from 5.9 at prior visit.  Patient endorses medication compliance.  Denies any hypoglycemic episodes.  She is on Xultophy 12 units daily.  Will encourage for dietary compliance.  Plan Continue Xultophy 12 units daily

## 2020-01-27 NOTE — Progress Notes (Signed)
Internal Medicine Clinic Attending ° °Case discussed with Dr. Aslam  At the time of the visit.  We reviewed the resident’s history and exam and pertinent patient test results.  I agree with the assessment, diagnosis, and plan of care documented in the resident’s note.  °

## 2020-01-30 ENCOUNTER — Telehealth: Payer: Self-pay | Admitting: Dietician

## 2020-01-30 NOTE — Telephone Encounter (Signed)
Patient left voicemail: "On her last insulin pen. Please refill". Called her; she had not called her pharmacy, informed her she should have refills.

## 2020-02-11 ENCOUNTER — Ambulatory Visit (INDEPENDENT_AMBULATORY_CARE_PROVIDER_SITE_OTHER): Payer: Medicare Other

## 2020-02-11 DIAGNOSIS — Z95810 Presence of automatic (implantable) cardiac defibrillator: Secondary | ICD-10-CM | POA: Diagnosis not present

## 2020-02-11 DIAGNOSIS — I5022 Chronic systolic (congestive) heart failure: Secondary | ICD-10-CM | POA: Diagnosis not present

## 2020-02-13 NOTE — Progress Notes (Signed)
EPIC Encounter for ICM Monitoring  Patient Name: Susan Peck is a 54 y.o. female Date: 02/13/2020 Primary Care Physican: Harvie Heck, MD Primary Cardiologist:Bensimhon Electrophysiologist:Klein Bi-V Pacing:98.4% 6/7/2021Office Weight:190  Transmission reviewed and results sent via mychart.  OptivolThoracic impedancenormal.  Prescribed dosage:Furosemide40 mg take 2 tablets (80 mg total) every morning and 1 tablet (40 mg total) every evening.  Recommendations: No changes.  Follow-up plan: ICM clinic phone appointment on 03/15/2020.   91 day device clinic remote transmission 04/05/2020.    EP/Cardiology Office Visits: Recalls for 08/19/2019 with Dr. Caryl Comes and 09/24/2020 with Dr Haroldine Laws.   Pt overdue to schedule appointment with Dr Caryl Comes.  Copy of ICM check sent to Dr. Caryl Comes.   3 month ICM trend: 02/11/2020    1 Year ICM trend:       [name], RN 02/13/2020 4:21 PM

## 2020-02-18 ENCOUNTER — Ambulatory Visit (HOSPITAL_COMMUNITY)
Admission: RE | Admit: 2020-02-18 | Discharge: 2020-02-18 | Disposition: A | Discharge status: Home / Self Care | Payer: Medicare Other | Source: Ambulatory Visit | Attending: Internal Medicine | Admitting: Internal Medicine

## 2020-02-18 ENCOUNTER — Encounter: Payer: Self-pay | Admitting: Internal Medicine

## 2020-02-18 ENCOUNTER — Other Ambulatory Visit: Payer: Self-pay

## 2020-02-18 DIAGNOSIS — E049 Nontoxic goiter, unspecified: Secondary | ICD-10-CM | POA: Diagnosis not present

## 2020-02-19 ENCOUNTER — Telehealth: Payer: Self-pay | Admitting: Internal Medicine
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
## 2020-02-22 ENCOUNTER — Encounter: Payer: Self-pay | Admitting: Internal Medicine

## 2020-02-23 ENCOUNTER — Telehealth: Payer: Self-pay

## 2020-02-23 NOTE — Telephone Encounter (Signed)
Returned call to patient as requested by voice mail message.  Patient reports she spoke with device clinic triage nurse regarding her defibrillator.  She was supposed to send a remote transmission for review on 02/19/2020 but home monitor is not working.  She called [facility] tech support and a new monitor should arrive within the next 2 days.  Advised to send report from new monitor and to call back when report is sent.  She is feeling fine at this time.

## 2020-02-25 NOTE — Telephone Encounter (Signed)
Returned call to patient as requested by voice mail message.  Confirmed [facility] report was received from her new monitor.  Advised fluid levels suggesting to be normal.  She just wanted to confirm her monitor was working correctly since receiving the new one yesterday.  No further questions.

## 2020-03-04 ENCOUNTER — Other Ambulatory Visit: Payer: Self-pay | Admitting: Internal Medicine

## 2020-03-13 ENCOUNTER — Other Ambulatory Visit (HOSPITAL_COMMUNITY): Payer: Self-pay | Admitting: Cardiology

## 2020-03-15 ENCOUNTER — Ambulatory Visit (INDEPENDENT_AMBULATORY_CARE_PROVIDER_SITE_OTHER): Payer: Medicare Other

## 2020-03-15 DIAGNOSIS — Z95810 Presence of automatic (implantable) cardiac defibrillator: Secondary | ICD-10-CM

## 2020-03-15 DIAGNOSIS — I5022 Chronic systolic (congestive) heart failure: Secondary | ICD-10-CM | POA: Diagnosis not present

## 2020-03-16 NOTE — Progress Notes (Signed)
EPIC Encounter for ICM Monitoring  Patient Name: Susan Peck is a 54 y.o. female Date: 03/16/2020 Primary Care Physican: Harvie Heck, MD Primary Cardiologist:Bensimhon Electrophysiologist:Klein Bi-V Pacing:99.9% 6/7/2021OfficeWeight:190  Attempted call to patient and unable to reach.  Left detailed message per DPR regarding transmission. Transmission reviewed.   OptivolThoracic impedancesuggesting possible fluid accumulation since 03/11/2020.  Prescribed dosage:Furosemide40 mg take 2 tablets (80 mg total) every morning and 1 tablet (40 mg total) every evening.  Labs: 12/29/2019 Creatinine 0.55, BUN 11, Potassium 3.6, Sodium 141, GFR >60 A complete set of results can be found in Results Review.  Recommendations:Left voice mail with ICM number and encouraged to call if experiencing any fluid symptoms.  Will have patient increase Furosemide if reached.   Follow-up plan: ICM clinic phone appointment on 03/22/2020 to recheck fluid levels.   91 day device clinic remote transmission 04/05/2020.    EP/Cardiology Office Visits: 04/27/2020 with Dr. Caryl Comes.  Recall for 09/24/2020 with Dr Haroldine Laws.     Copy of ICM check sent to Dr. Caryl Comes.   3 month ICM trend: 03/15/2020    1 Year ICM trend:       [name], RN 03/16/2020 11:14 AM

## 2020-03-21 ENCOUNTER — Other Ambulatory Visit: Payer: Self-pay | Admitting: Internal Medicine

## 2020-03-21 DIAGNOSIS — G2581 Restless legs syndrome: Secondary | ICD-10-CM

## 2020-03-22 ENCOUNTER — Ambulatory Visit (INDEPENDENT_AMBULATORY_CARE_PROVIDER_SITE_OTHER): Payer: Medicare Other

## 2020-03-22 DIAGNOSIS — Z95810 Presence of automatic (implantable) cardiac defibrillator: Secondary | ICD-10-CM

## 2020-03-22 DIAGNOSIS — I5022 Chronic systolic (congestive) heart failure: Secondary | ICD-10-CM

## 2020-03-23 NOTE — Progress Notes (Signed)
EPIC Encounter for ICM Monitoring  Patient Name: Susan Peck is a 54 y.o. female Date: 03/23/2020 Primary Care Physican: Harvie Heck, MD Primary Cardiologist:Bensimhon Electrophysiologist:Klein Bi-V Pacing:99.8% 6/7/2021OfficeWeight:190  Transmission reviewed.   OptivolThoracic impedancereturned close to baseline normal  Prescribed dosage:Furosemide40 mg take 2 tablets (80 mg total) every morning and 1 tablet (40 mg total) every evening.  Labs: 12/29/2019 Creatinine 0.55, BUN 11, Potassium 3.6, Sodium 141, GFR >60 A complete set of results can be found in Results Review.  Recommendations:No changes.  Follow-up plan: ICM clinic phone appointment on9/27/2021. 91 day device clinic remote transmission 04/05/2020.   EP/Cardiology Office Visits:04/27/2020 with Dr.Klein.  Recall for 09/24/2020 with Dr Haroldine Laws.  Copy of ICM check sent to [place].    3 month ICM trend: 03/22/2020    1 Year ICM trend:       [name], RN 03/23/2020 4:02 PM

## 2020-03-24 ENCOUNTER — Telehealth: Payer: Self-pay

## 2020-03-24 NOTE — Telephone Encounter (Addendum)
Returned patient call as requested by voice mail message.  Patient reports she has had a couple of episodes at the end of last week that she felt an electrical current in her chest area where her device is located.  She has felt something similar initially when the device was implanted and she laid on her left side.  She says it feels like a Tens Unit electrical current. In the last 2 days she has felt pain around he device area.  Advised if she is having chest pain to go to ER to have it evaluated.  She said she does not think it the kind of chest pain that could be related to heart attack.  She does not have any redness, swelling or open areas around he device.  Reviewed 03/22/2020 [facility] report and does not show any shock episodes.  She is not home at this time and unable to send updated report.    Advised will forward to device clinic for review and recommendations.  She said it is not urgent.  Advised device clinic will give her a call.

## 2020-03-24 NOTE — Telephone Encounter (Signed)
Patient reports intermittent, sharp," needle like"  pain in area of device and in her back in the left scapular region. She reports no SOB, nausea, diaphoresis, dizziness or syncope. She reports the pain at her device site occurs at rest and with activity and is relieved by taking an 81 mg ASA on most occasions. She will send a remote transmission tonight when she gets home. No active CP, or chest pressure.

## 2020-03-26 NOTE — Telephone Encounter (Addendum)
Transmission from 03/24/20 reviewed. Normal ICD function. No episodes.  Pt made aware. She reports that the needle-like discomfort at her ICD site improves after awhile if she massages the site. Also reports ICD site is sometimes sore when laying on left side. Denies further "electrical current" sensation. Denies changes in appearance of site, no drainage, redness, or swelling. Intermittent diaphragmatic stim when laying on left site, improves with position change, previously addressed 03/2019 by A. Tillery, PA-C. Pt has f/u with Dr. Caryl Comes on on 04/27/20. She feels she can wait until then to address diaphragmatic stim.   Advised will forward to Dr. Caryl Comes for recommendations regarding ICD site discomfort. Pt agrees to call back sooner if any signs/symptoms of infection, or if stim becomes more frequent/bothersome.

## 2020-03-29 NOTE — Telephone Encounter (Signed)
Noted  

## 2020-03-31 NOTE — Telephone Encounter (Signed)
LMOVM (DPR) advising no new recommendations from Dr. Caryl Comes. Advised to call back if any new concerns or worsening symptoms prior to OV with Dr. Caryl Comes on 04/27/20. DC number and office hours provided.

## 2020-04-05 ENCOUNTER — Ambulatory Visit (INDEPENDENT_AMBULATORY_CARE_PROVIDER_SITE_OTHER): Payer: Medicare Other | Admitting: *Deleted

## 2020-04-05 DIAGNOSIS — I428 Other cardiomyopathies: Secondary | ICD-10-CM | POA: Diagnosis not present

## 2020-04-07 ENCOUNTER — Telehealth: Payer: Self-pay | Admitting: Emergency Medicine

## 2020-04-07 LAB — CUP PACEART REMOTE DEVICE CHECK
Battery Remaining Longevity: 40 mo
Battery Voltage: 2.96 V
Brady Statistic AP VP Percent: 0.26 %
Brady Statistic AP VS Percent: 0.01 %
Brady Statistic AS VP Percent: 99.71 %
Brady Statistic AS VS Percent: 0.03 %
Brady Statistic RA Percent Paced: 0.27 %
Brady Statistic RV Percent Paced: 99.93 %
Date Time Interrogation Session: 20210913073825
HighPow Impedance: 69 Ohm
Implantable Lead Implant Date: 20180524
Implantable Lead Implant Date: 20180524
Implantable Lead Implant Date: 20180524
Implantable Lead Location: 753858
Implantable Lead Location: 753859
Implantable Lead Location: 753860
Implantable Lead Model: 4398
Implantable Lead Model: 5076
Implantable Pulse Generator Implant Date: 20180524
Lead Channel Impedance Value: 145.871
Lead Channel Impedance Value: 153.152
Lead Channel Impedance Value: 156.606
Lead Channel Impedance Value: 165.029
Lead Channel Impedance Value: 170.472
Lead Channel Impedance Value: 266 Ohm
Lead Channel Impedance Value: 304 Ohm
Lead Channel Impedance Value: 304 Ohm
Lead Channel Impedance Value: 323 Ohm
Lead Channel Impedance Value: 361 Ohm
Lead Channel Impedance Value: 399 Ohm
Lead Channel Impedance Value: 418 Ohm
Lead Channel Impedance Value: 513 Ohm
Lead Channel Impedance Value: 513 Ohm
Lead Channel Impedance Value: 570 Ohm
Lead Channel Impedance Value: 570 Ohm
Lead Channel Impedance Value: 570 Ohm
Lead Channel Impedance Value: 627 Ohm
Lead Channel Pacing Threshold Amplitude: 0.375 V
Lead Channel Pacing Threshold Amplitude: 0.375 V
Lead Channel Pacing Threshold Amplitude: 0.625 V
Lead Channel Pacing Threshold Pulse Width: 0.4 ms
Lead Channel Pacing Threshold Pulse Width: 0.4 ms
Lead Channel Pacing Threshold Pulse Width: 1 ms
Lead Channel Sensing Intrinsic Amplitude: 2.625 mV
Lead Channel Sensing Intrinsic Amplitude: 2.625 mV
Lead Channel Sensing Intrinsic Amplitude: 20.625 mV
Lead Channel Sensing Intrinsic Amplitude: 20.625 mV
Lead Channel Setting Pacing Amplitude: 0.75 V
Lead Channel Setting Pacing Amplitude: 1.5 V
Lead Channel Setting Pacing Amplitude: 2.5 V
Lead Channel Setting Pacing Pulse Width: 0.4 ms
Lead Channel Setting Pacing Pulse Width: 1 ms
Lead Channel Setting Sensing Sensitivity: 0.3 mV

## 2020-04-07 NOTE — Telephone Encounter (Signed)
Patient did not understand home remote monitoring report from 04/05/20. Report reviewed and reassured her that device function WNL, no episodes of concern. Education done that no call received due to no concerns found on transmission.

## 2020-04-07 NOTE — Progress Notes (Signed)
Remote ICD transmission.   

## 2020-04-10 ENCOUNTER — Other Ambulatory Visit: Payer: Self-pay | Admitting: Internal Medicine

## 2020-04-12 LAB — HM DIABETES EYE EXAM

## 2020-04-21 NOTE — Progress Notes (Signed)
No ICM remote transmission received for 04/19/2020 and next ICM transmission scheduled for 05/24/2020.

## 2020-04-25 DIAGNOSIS — Z95810 Presence of automatic (implantable) cardiac defibrillator: Secondary | ICD-10-CM | POA: Insufficient documentation

## 2020-04-27 ENCOUNTER — Encounter: Payer: Self-pay | Admitting: Internal Medicine

## 2020-04-27 ENCOUNTER — Other Ambulatory Visit: Payer: Self-pay

## 2020-04-27 ENCOUNTER — Ambulatory Visit (INDEPENDENT_AMBULATORY_CARE_PROVIDER_SITE_OTHER): Payer: Medicare Other | Admitting: Internal Medicine

## 2020-04-27 VITALS — BP 140/90 | HR 68 | Ht 64.0 in | Wt 193.0 lb

## 2020-04-27 DIAGNOSIS — I428 Other cardiomyopathies: Secondary | ICD-10-CM | POA: Diagnosis not present

## 2020-04-27 DIAGNOSIS — I5022 Chronic systolic (congestive) heart failure: Secondary | ICD-10-CM

## 2020-04-27 DIAGNOSIS — Z95810 Presence of automatic (implantable) cardiac defibrillator: Secondary | ICD-10-CM | POA: Diagnosis not present

## 2020-04-27 NOTE — Progress Notes (Signed)
Patient Care Team: Harvie Heck, MD as PCP - General Juluis Rainier as Consulting Physician (Optometry)   HPI  Susan Peck is a 54 y.o. female   Seen in followup for ICD-CRT  implanted 5/18 for primary prevention for NICM with interval normalization of LV function  DATE TEST EF   9/12 Echo  25-30%   2/17 Cath 35 % Normal CA  2/17 Echo 30-35 %   3/18 Echo  20-25%   3/19 Echo  45-50%   6/21 Echo  55-60%     she has had hx of diaphragmatic stimulation requiring reprogramming and she comes in with complaints of recurrences   The patient denies chest pain, shortness of breath, nocturnal dyspnea, orthopnea or peripheral edema.  There have been no palpitations, lightheadedness or syncope.    Her main complaint remains pain on her left upper chest which radiates to her right upper chest into her back.  Often she has to massage it.  Tylenol is unhelpful.  Date Cr K Mg  8/18  0.57 4.3   3/19 1.07 4.2 1.8  7/19 0.59 3.7   1/19     6/21 0.55 3.6      Past Medical History:  Diagnosis Date   AICD (automatic cardioverter/defibrillator) present 12/13/2016   Anxiety    no meds   Asthma    CHF (congestive heart failure) ([place])    Depression    no meds   Diabetes mellitus    recent dx 11/17/11 - started med 11/18/11 type 2   GERD (gastroesophageal reflux disease)    Goiter 07/27/2011   non-neoplastic goiter - fine needle aspiration - benign sees dr Dwyane Dee for   Heart murmur    dx 2 yrs ago per pt   Herpes genitalis in women    Hypertension    IBS (irritable bowel syndrome)    tx with diet per pt   Low back pain    history   Obesity    Seasonal allergies    Shortness of breath    occasional - exercise induced   Sleep apnea    cpap broken   Systolic heart failure    May 2011 EF 35-40%, 04/03/11 EF 25-30%, 06/27/11 EF 30-35%    Past Surgical History:  Procedure Laterality Date   ABDOMINAL HYSTERECTOMY     BIV ICD INSERTION CRT-D N/A 12/13/2016    Procedure: BiV ICD Insertion CRT-D;  Surgeon: Deboraha Sprang, MD;  Location: [facility];  Service: Cardiovascular;  Laterality: N/A;   cardiac catherization  2004   [place], [place] - Dr Lynnell Jude   CARDIAC CATHETERIZATION     CARDIAC CATHETERIZATION N/A 09/15/2015   Procedure: Right/Left Heart Cath and Coronary Angiography;  Surgeon: Jolaine Artist, MD;  Location: [facility];  Service: Cardiovascular;  Laterality: N/A;   COLONOSCOPY WITH PROPOFOL N/A 03/26/2015   Procedure: COLONOSCOPY WITH PROPOFOL;  Surgeon: Carol Ada, MD;  Location: [facility];  Service: Endoscopy;  Laterality: N/A;   colonscopy     CYSTOSCOPY  11/23/2011   Procedure: CYSTOSCOPY;  Surgeon: Jolayne Haines, MD;  Location: [facility];  Service: Gynecology;  Laterality: N/A;   DIAGNOSTIC LAPAROSCOPY     of pelvis   DILATION AND CURETTAGE OF UTERUS  12/2006,  10/2005   hysteroscopy surgery x 2   INSERTION OF ICD  12/13/2016   BIV   LAPAROSCOPIC ASSISTED VAGINAL HYSTERECTOMY  11/23/2011   Procedure: LAPAROSCOPIC ASSISTED VAGINAL HYSTERECTOMY;  Surgeon: Jolayne Haines, MD;  Location: [facility];  Service: Gynecology;  Laterality: N/A;   NOVASURE ABLATION     10/2005   SALPINGOOPHORECTOMY  11/23/2011   Procedure: SALPINGO OOPHERECTOMY;  Surgeon: Jolayne Haines, MD;  Location: [facility];  Service: Gynecology;  Laterality: Bilateral;   SVD     x 3   TOOTH EXTRACTION N/A 04/05/2017   Procedure: DENTAL EXTRACTIONS OF TEETH FIVE, SEVEN, SIXTEEN, SEVENTEEN;  Surgeon: Diona Browner, DDS;  Location: [facility];  Service: Oral Surgery;  Laterality: N/A;   TUBAL LIGATION     WISDOM TOOTH EXTRACTION      Current Outpatient Medications  Medication Sig Dispense Refill   aspirin 81 MG chewable tablet Chew 81 mg by mouth daily.      atorvastatin (LIPITOR) 40 MG tablet TAKE 1 TABLET BY MOUTH EVERY DAY 90 tablet 3   carvedilol (COREG) 25 MG tablet TAKE 1 TABLET (25 MG TOTAL) BY MOUTH 2 (TWO) TIMES DAILY WITH A MEAL. 180  tablet 3   diclofenac Sodium (VOLTAREN) 1 % GEL APPLY 2 GRAMS TO AFFECTED AREA 4 TIMES A DAY 100 g 1   eplerenone (INSPRA) 25 MG tablet Take 0.5 tablets (12.5 mg total) by mouth daily. 45 tablet 3   fluticasone (FLONASE) 50 MCG/ACT nasal spray PLACE 1 SPRAY INTO BOTH NOSTRILS 2 (TWO) TIMES DAILY. 48 mL 1   furosemide (LASIX) 40 MG tablet TAKE 2 TABS EVERY MORNING AND 1 TAB EVERY EVENING. 270 tablet 0   glucose blood (ONETOUCH VERIO) test strip Insulin dependent. Check blood sugar up to 3 times daily. diag code E11.65 100 each 12   Insulin Pen Needle 32G X 4 MM MISC Use to inject xultophy daily 100 each 5   Lancet Devices (ONETOUCH DELICA PLUS LANCING) MISC USE AS DIRECTED TO CHECK BLOOD SUGAR 1 each 2   mupirocin cream (BACTROBAN) 2 % Apply 1 application topically 2 (two) times daily. 15 g 1   ONETOUCH DELICA LANCETS FINE MISC Insulin dependent. Check blood sugar up to 3 times daily. diag code E11.65 100 each 12   PATADAY 0.2 % SOLN Place 1 drop into both eyes 2 (two) times daily.   4   polyethylene glycol (MIRALAX / GLYCOLAX) packet Take 17 g by mouth daily. 14 each 0   sacubitril-valsartan (ENTRESTO) 97-103 MG Take 1 tablet by mouth 2 (two) times daily. 60 tablet 11   VYZULTA 0.024 % SOLN SMARTSIG:1 Drop(s) In Eye(s) Every Evening     Insulin Degludec-Liraglutide (XULTOPHY) 100-3.6 UNIT-MG/ML SOPN Inject 12 Units/day into the skin at bedtime. 15 pen 11   pregabalin (LYRICA) 75 MG capsule Take 1 capsule (75 mg total) by mouth 3 (three) times daily. 90 capsule 1   No current facility-administered medications for this visit.    Allergies  Allergen Reactions   Shrimp [Shellfish Allergy] Anaphylaxis      Review of Systems negative except from HPI and PMH  Physical Exam BP 140/90    Pulse 68    Ht 5\' 4"  (1.626 m)    Wt 193 lb (87.5 kg)    SpO2 96%    BMI 33.13 kg/m  Well developed and well nourished in no acute distress HENT normal Neck supple with  JVP-flat Clear Device pocket well healed; without hematoma or erythema.  There is no tethering  Regular rate and rhythm, no   murmur Abd-soft with active BS No Clubbing cyanosis   edema Skin-warm and dry A & Oriented  Grossly normal sensory  and motor function  ECG sinus with P synchronous pacing at 68 Intervals 05/06/1944 - QRS lead I RSR prime in V1  Assessment and  Plan  NICM  CHF chronic diastolic  Hypertension  Diaphragmatic stimulation  CRT- D Medtronic   device has been reprogrammed to decrease the likelihood of diaphragmatic stimulation going from 2-3 to 1> coil   L sided chest pain   Euvolemic continue current meds  Blood pressure borderline, suspect will need augmented therapy  Her chest discomfort remains concerning for device pocket infection; however, the fact that there is radiation to the back and radiation to the right chest would suggest that pocket infection is not the explanation.  She has excellent range of motion and flexibility of her left arm; hence, do not think this is frozen shoulder.  At this point would treated symptomatically.  We discussed narcotics.  I told her I would not be comfortable giving her narcotics for and open ended problem like this.  We discussed also the potential role of device explantation          Current medicines are reviewed at length with the patient today .  The patient does not  have concerns regarding medicines.

## 2020-04-27 NOTE — Patient Instructions (Signed)

## 2020-04-28 LAB — CUP PACEART INCLINIC DEVICE CHECK
Battery Remaining Longevity: 40 mo
Battery Voltage: 2.96 V
Brady Statistic AP VP Percent: 0.91 %
Brady Statistic AP VS Percent: 0.01 %
Brady Statistic AS VP Percent: 99.01 %
Brady Statistic AS VS Percent: 0.07 %
Brady Statistic RA Percent Paced: 0.92 %
Brady Statistic RV Percent Paced: 99.2 %
Date Time Interrogation Session: 20211005143600
HighPow Impedance: 72 Ohm
Implantable Lead Implant Date: 20180524
Implantable Lead Implant Date: 20180524
Implantable Lead Implant Date: 20180524
Implantable Lead Location: 753858
Implantable Lead Location: 753859
Implantable Lead Location: 753860
Implantable Lead Model: 4398
Implantable Lead Model: 5076
Implantable Pulse Generator Implant Date: 20180524
Lead Channel Impedance Value: 170.472
Lead Channel Impedance Value: 178.5 Ohm
Lead Channel Impedance Value: 180.5 Ohm
Lead Channel Impedance Value: 189.525
Lead Channel Impedance Value: 189.525
Lead Channel Impedance Value: 323 Ohm
Lead Channel Impedance Value: 361 Ohm
Lead Channel Impedance Value: 361 Ohm
Lead Channel Impedance Value: 399 Ohm
Lead Channel Impedance Value: 399 Ohm
Lead Channel Impedance Value: 418 Ohm
Lead Channel Impedance Value: 456 Ohm
Lead Channel Impedance Value: 570 Ohm
Lead Channel Impedance Value: 570 Ohm
Lead Channel Impedance Value: 608 Ohm
Lead Channel Impedance Value: 627 Ohm
Lead Channel Impedance Value: 627 Ohm
Lead Channel Impedance Value: 627 Ohm
Lead Channel Pacing Threshold Amplitude: 0.5 V
Lead Channel Pacing Threshold Amplitude: 0.5 V
Lead Channel Pacing Threshold Amplitude: 0.75 V
Lead Channel Pacing Threshold Pulse Width: 0.4 ms
Lead Channel Pacing Threshold Pulse Width: 0.4 ms
Lead Channel Pacing Threshold Pulse Width: 1 ms
Lead Channel Sensing Intrinsic Amplitude: 2.875 mV
Lead Channel Sensing Intrinsic Amplitude: 22.125 mV
Lead Channel Sensing Intrinsic Amplitude: 27.125 mV
Lead Channel Sensing Intrinsic Amplitude: 3.125 mV
Lead Channel Setting Pacing Amplitude: 0.75 V
Lead Channel Setting Pacing Amplitude: 1.5 V
Lead Channel Setting Pacing Amplitude: 2.5 V
Lead Channel Setting Pacing Pulse Width: 0.4 ms
Lead Channel Setting Pacing Pulse Width: 1 ms
Lead Channel Setting Sensing Sensitivity: 0.3 mV

## 2020-04-29 ENCOUNTER — Other Ambulatory Visit: Payer: Self-pay | Admitting: Internal Medicine

## 2020-04-29 DIAGNOSIS — I5022 Chronic systolic (congestive) heart failure: Secondary | ICD-10-CM

## 2020-05-20 ENCOUNTER — Other Ambulatory Visit: Payer: Self-pay | Admitting: Internal Medicine

## 2020-05-20 DIAGNOSIS — E1165 Type 2 diabetes mellitus with hyperglycemia: Secondary | ICD-10-CM

## 2020-05-24 ENCOUNTER — Ambulatory Visit (INDEPENDENT_AMBULATORY_CARE_PROVIDER_SITE_OTHER): Payer: Medicare Other

## 2020-05-24 DIAGNOSIS — Z95810 Presence of automatic (implantable) cardiac defibrillator: Secondary | ICD-10-CM | POA: Diagnosis not present

## 2020-05-24 DIAGNOSIS — I5022 Chronic systolic (congestive) heart failure: Secondary | ICD-10-CM

## 2020-05-25 ENCOUNTER — Telehealth: Payer: Self-pay

## 2020-05-25 NOTE — Telephone Encounter (Signed)
Remote ICM transmission received.  Attempted call to patient regarding ICM remote transmission and left detailed message per DPR.  Advised to return call for any fluid symptoms or questions.  

## 2020-05-25 NOTE — Progress Notes (Signed)
EPIC Encounter for ICM Monitoring  Patient Name: Susan Peck is a 54 y.o. female Date: 05/25/2020 Primary Care Physican: Harvie Heck, MD Primary Cardiologist:Bensimhon Electrophysiologist:Klein Bi-V Pacing:99.9% 10/5/2021OfficeWeight:193 lbs  Attempted call to patient and unable to reach.  Left detailed message per DPR regarding transmission. Transmission reviewed.   OptivolThoracic impedancesuggesting possible fluid accumulation since 05/21/2020  Prescribed dosage:Furosemide40 mg take 2 tablets (80 mg total) every morning and 1 tablet (40 mg total) every evening.  Labs: 12/29/2019 Creatinine0.55, BUN11, Potassium3.6, Sodium141, GFR>60 A complete set of results can be found in Results Review.  Recommendations: Left voice mail with ICM number and encouraged to call if experiencing any fluid symptoms.  Follow-up plan: ICM clinic phone appointment on11/11/2019 to recheck fluid levels. 91 day device clinic remote transmission 07/05/2020.   EP/Cardiology Office Visits:Recall for 09/24/2020 with Dr Haroldine Laws.Recall 04/27/2021 with Dr Caryl Comes.  Copy of ICM check sent to [facility].   3 month ICM trend: 05/24/2020    1 Year ICM trend:       [name], RN 05/25/2020 11:01 AM

## 2020-05-28 ENCOUNTER — Ambulatory Visit (INDEPENDENT_AMBULATORY_CARE_PROVIDER_SITE_OTHER): Payer: Medicare Other

## 2020-05-28 DIAGNOSIS — Z95810 Presence of automatic (implantable) cardiac defibrillator: Secondary | ICD-10-CM

## 2020-05-28 DIAGNOSIS — I5022 Chronic systolic (congestive) heart failure: Secondary | ICD-10-CM

## 2020-05-28 NOTE — Progress Notes (Signed)
EPIC Encounter for ICM Monitoring  Patient Name: Susan Peck is a 54 y.o. female Date: 05/28/2020 Primary Care Physican: Harvie Heck, MD Primary Cardiologist:Bensimhon Electrophysiologist:Klein Bi-V Pacing:99.9% 10/5/2021OfficeWeight:193 lbs  Transmission reviewed.   OptivolThoracic impedancesuggesting fluid levels returned to normal.  Prescribed dosage:Furosemide40 mg take 2 tablets (80 mg total) every morning and 1 tablet (40 mg total) every evening.  Labs: 12/29/2019 Creatinine0.55, BUN11, Potassium3.6, Sodium141, GFR>60 A complete set of results can be found in Results Review.  Recommendations: No changes.  Follow-up plan: ICM clinic phone appointment on12/14/2021. 91 day device clinic remote transmission 07/05/2020.   EP/Cardiology Office Visits:Recall for 09/24/2020 with Dr Haroldine Laws.Recall 04/27/2021 with Dr Caryl Comes.  Copy of ICM check sent to [facility].    3 month ICM trend: 05/28/2020    1 Year ICM trend:       [name], RN 05/28/2020 1:58 PM

## 2020-06-03 ENCOUNTER — Other Ambulatory Visit: Payer: Self-pay | Admitting: Internal Medicine

## 2020-06-03 DIAGNOSIS — S81801A Unspecified open wound, right lower leg, initial encounter: Secondary | ICD-10-CM

## 2020-06-14 ENCOUNTER — Other Ambulatory Visit: Payer: Self-pay | Admitting: Internal Medicine

## 2020-06-14 DIAGNOSIS — I5022 Chronic systolic (congestive) heart failure: Secondary | ICD-10-CM

## 2020-07-05 ENCOUNTER — Ambulatory Visit (INDEPENDENT_AMBULATORY_CARE_PROVIDER_SITE_OTHER): Payer: Medicare Other

## 2020-07-05 DIAGNOSIS — I5022 Chronic systolic (congestive) heart failure: Secondary | ICD-10-CM

## 2020-07-05 DIAGNOSIS — I428 Other cardiomyopathies: Secondary | ICD-10-CM

## 2020-07-05 LAB — CUP PACEART REMOTE DEVICE CHECK
Battery Remaining Longevity: 35 mo
Battery Voltage: 2.96 V
Brady Statistic AP VP Percent: 0.25 %
Brady Statistic AP VS Percent: 0.01 %
Brady Statistic AS VP Percent: 99.71 %
Brady Statistic AS VS Percent: 0.03 %
Brady Statistic RA Percent Paced: 0.26 %
Brady Statistic RV Percent Paced: 99.94 %
Date Time Interrogation Session: 20211213063628
HighPow Impedance: 80 Ohm
Implantable Lead Implant Date: 20180524
Implantable Lead Implant Date: 20180524
Implantable Lead Implant Date: 20180524
Implantable Lead Location: 753858
Implantable Lead Location: 753859
Implantable Lead Location: 753860
Implantable Lead Model: 4398
Implantable Lead Model: 5076
Implantable Pulse Generator Implant Date: 20180524
Lead Channel Impedance Value: 172.541
Lead Channel Impedance Value: 172.541
Lead Channel Impedance Value: 172.541
Lead Channel Impedance Value: 172.541
Lead Channel Impedance Value: 199.5 Ohm
Lead Channel Impedance Value: 304 Ohm
Lead Channel Impedance Value: 304 Ohm
Lead Channel Impedance Value: 361 Ohm
Lead Channel Impedance Value: 399 Ohm
Lead Channel Impedance Value: 399 Ohm
Lead Channel Impedance Value: 418 Ohm
Lead Channel Impedance Value: 456 Ohm
Lead Channel Impedance Value: 532 Ohm
Lead Channel Impedance Value: 570 Ohm
Lead Channel Impedance Value: 608 Ohm
Lead Channel Impedance Value: 608 Ohm
Lead Channel Impedance Value: 608 Ohm
Lead Channel Impedance Value: 684 Ohm
Lead Channel Pacing Threshold Amplitude: 0.375 V
Lead Channel Pacing Threshold Amplitude: 0.5 V
Lead Channel Pacing Threshold Amplitude: 0.625 V
Lead Channel Pacing Threshold Pulse Width: 0.4 ms
Lead Channel Pacing Threshold Pulse Width: 0.4 ms
Lead Channel Pacing Threshold Pulse Width: 1 ms
Lead Channel Sensing Intrinsic Amplitude: 29.875 mV
Lead Channel Sensing Intrinsic Amplitude: 29.875 mV
Lead Channel Sensing Intrinsic Amplitude: 3 mV
Lead Channel Sensing Intrinsic Amplitude: 3 mV
Lead Channel Setting Pacing Amplitude: 0.75 V
Lead Channel Setting Pacing Amplitude: 1.5 V
Lead Channel Setting Pacing Amplitude: 2.5 V
Lead Channel Setting Pacing Pulse Width: 0.4 ms
Lead Channel Setting Pacing Pulse Width: 1 ms
Lead Channel Setting Sensing Sensitivity: 0.3 mV

## 2020-07-06 ENCOUNTER — Ambulatory Visit (INDEPENDENT_AMBULATORY_CARE_PROVIDER_SITE_OTHER): Payer: Medicare Other

## 2020-07-06 DIAGNOSIS — I5022 Chronic systolic (congestive) heart failure: Secondary | ICD-10-CM

## 2020-07-06 DIAGNOSIS — Z95810 Presence of automatic (implantable) cardiac defibrillator: Secondary | ICD-10-CM

## 2020-07-09 NOTE — Progress Notes (Signed)
EPIC Encounter for ICM Monitoring  Patient Name: Susan Peck is a 54 y.o. female Date: 07/09/2020 Primary Care Physican: Susan Heck, MD Primary Cardiologist:Susan Peck Electrophysiologist:Susan Peck Bi-V Pacing:99.9% 12/172021 Weight:193 lbs  Spoke with patient and reports feeling well at this time.  Denies fluid symptoms.    OptivolThoracic impedancenormal.  Prescribed dosage:Furosemide40 mg take 2 tablets (80 mg total) every morning and 1 tablet (40 mg total) every evening.  Labs: 12/29/2019 Creatinine0.55, BUN11, Potassium3.6, Sodium141, GFR>60 A complete set of results can be found in Results Review.  Recommendations:No changes and encouraged to call if experiencing any fluid symptoms.  Follow-up plan: ICM clinic phone appointment on2/03/2021. 91 day device clinic remote transmission3/14/2022.   EP/Cardiology Office Visits:Recall for 09/24/2020 with Dr Susan Peck.Recall 04/27/2021 with Dr Susan Peck.  Copy of ICM check sent to [name].  3 month ICM trend: 07/05/2020    1 Year ICM trend:       Susan Billings, RN 07/09/2020 12:12 PM

## 2020-07-20 NOTE — Progress Notes (Signed)
Remote ICD transmission.   

## 2020-08-04 ENCOUNTER — Other Ambulatory Visit: Payer: Self-pay

## 2020-08-04 DIAGNOSIS — E1165 Type 2 diabetes mellitus with hyperglycemia: Secondary | ICD-10-CM

## 2020-08-04 MED ORDER — ONETOUCH VERIO VI STRP: ORAL_STRIP | 12 refills | Status: AC

## 2020-08-04 NOTE — Telephone Encounter (Signed)
Requesting test strips to be filled. Please call pt back.  

## 2020-08-08 ENCOUNTER — Other Ambulatory Visit (HOSPITAL_COMMUNITY): Payer: Self-pay | Admitting: Internal Medicine

## 2020-08-26 ENCOUNTER — Other Ambulatory Visit: Payer: Self-pay | Admitting: Internal Medicine

## 2020-09-01 ENCOUNTER — Ambulatory Visit (INDEPENDENT_AMBULATORY_CARE_PROVIDER_SITE_OTHER): Payer: Medicare Other

## 2020-09-01 DIAGNOSIS — Z95810 Presence of automatic (implantable) cardiac defibrillator: Secondary | ICD-10-CM

## 2020-09-01 DIAGNOSIS — I5022 Chronic systolic (congestive) heart failure: Secondary | ICD-10-CM | POA: Diagnosis not present

## 2020-09-08 NOTE — Progress Notes (Signed)
EPIC Encounter for ICM Monitoring  Patient Name: Susan Peck is a 55 y.o. female Date: 09/08/2020 Primary Care Physican: Harvie Heck, MD Primary Cardiologist:Bensimhon Electrophysiologist:Klein Bi-V Pacing:99.7% 12/172021 Weight:193 lbs   Transmission reviewed.  OptivolThoracic impedancenormal.  Prescribed dosage:Furosemide40 mg take 2 tablets (80 mg total) every morning and 1 tablet (40 mg total) every evening.  Labs: 12/29/2019 Creatinine0.55, BUN11, Potassium3.6, Sodium141, GFR>60 A complete set of results can be found in Results Review.  Recommendations:No changes.  Follow-up plan: ICM clinic phone appointment on3/15/2022. 91 day device clinic remote transmission3/14/2022.   EP/Cardiology Office Visits:Recall for 09/24/2020 with Dr Haroldine Laws.Recall 04/27/2021 with Dr Caryl Comes.  Copy of ICM check sent to [facility].   3 month ICM trend: 09/01/2020.    1 Year ICM trend:       [name], RN 09/08/2020 12:43 PM

## 2020-10-04 ENCOUNTER — Ambulatory Visit (INDEPENDENT_AMBULATORY_CARE_PROVIDER_SITE_OTHER): Payer: Medicare Other | Admitting: Nurse Practitioner

## 2020-10-04 ENCOUNTER — Ambulatory Visit (INDEPENDENT_AMBULATORY_CARE_PROVIDER_SITE_OTHER): Payer: Medicare Other

## 2020-10-04 ENCOUNTER — Other Ambulatory Visit: Payer: Self-pay

## 2020-10-04 ENCOUNTER — Encounter: Payer: Self-pay | Admitting: Nurse Practitioner

## 2020-10-04 VITALS — BP 118/76 | HR 80 | Ht 63.5 in | Wt 195.0 lb

## 2020-10-04 DIAGNOSIS — I5022 Chronic systolic (congestive) heart failure: Secondary | ICD-10-CM | POA: Diagnosis not present

## 2020-10-04 DIAGNOSIS — Z01419 Encounter for gynecological examination (general) (routine) without abnormal findings: Secondary | ICD-10-CM | POA: Diagnosis not present

## 2020-10-04 DIAGNOSIS — Z90710 Acquired absence of both cervix and uterus: Secondary | ICD-10-CM | POA: Diagnosis not present

## 2020-10-04 NOTE — Progress Notes (Signed)
   Susan Peck 09-25-1965 [id number]   History:  55 y.o. [id number] presents for annual exam. 2013 TVH BSO for chronic pelvic pain, no HRT. Normal pap and mammogram history. History of T2DM, CHF, ICD, COPD. Mother deceased in her 23s from breast cancer. She sees her PCP this week with plans to discuss bilateral nocturnal leg pain and thyroid goiter (was seen for this in the past). She has had trouble with swallowing and feels like food gets stuck near sternum. She has been evaluated by GI for this in the past and plans to follow up with them.   Gynecologic History No LMP recorded. Patient has had a hysterectomy.   Contraception/Family planning: status post hysterectomy  Health Maintenance Last Pap: No longer screening per guidelines Last mammogram: 2017. Results were: normal Last colonoscopy: 2016. Results were: Normal, 10-year recall Last Dexa: 06/2019. Results were: normal, 5-year recall  Past medical history, past surgical history, family history and social history were all reviewed and documented in the EPIC chart.  ROS:  A ROS was performed and pertinent positives and negatives are included.  Exam:  Vitals:   10/04/20 1025  BP: 118/76  Pulse: 80  Weight: 195 lb (88.5 kg)  Height: 5' 3.5" (1.613 m)   Body mass index is 34 kg/m.  General appearance:  Normal Thyroid:  Right goiter Respiratory  Auscultation:  Clear without wheezing or rhonchi Cardiovascular  Auscultation:  Regular rate, without rubs, murmurs or gallops  Edema/varicosities:  Not grossly evident Abdominal  Soft,nontender, without masses, guarding or rebound.  Liver/spleen:  No organomegaly noted  Hernia:  None appreciated  Skin  Inspection:  Grossly normal   Breasts: Examined lying and sitting.   Right: Without masses, retractions, discharge or axillary adenopathy.   Left: Without masses, retractions, discharge or axillary adenopathy. Gentitourinary   Inguinal/mons:  Normal without inguinal  adenopathy  External genitalia:  Normal  BUS/Urethra/Skene's glands:  Normal  Vagina:  Normal  Cervix:  Absent  Uterus:  Absent  Adnexa/parametria:     Rt: Without masses or tenderness.   Lt: Without masses or tenderness.  Anus and perineum: Normal  Digital rectal exam: Normal sphincter tone without palpated masses or tenderness  Assessment/Plan:  55 y.o. [id number] for annual exam.   Well female exam with routine gynecological exam - Education provided on SBEs, importance of preventative screenings, current guidelines, high calcium diet, regular exercise, and multivitamin daily. Labs with PCP.  History of total vaginal hysterectomy (TVH) - BSO for chronic pelvic pain. No HRT.   Screening for cervical cancer - Normal Pap history.  No longer screening per guidelines.  Screening for breast cancer - Normal mammogram history. She is overdue and plans to schedule this soon. She has been nervous about going with ICD in place. Mother deceased in her 52s from breast cancer.  Normal breast exam today.  Screening for colon cancer - 2016 colonoscopy. Will repeat at GI's recommended interval.   Return in 1 year for annual.     Tamela Gammon [facility], 11:01 AM 10/04/2020

## 2020-10-04 NOTE — Patient Instructions (Addendum)
Schedule mammogram! [facility] [phone] [address]  [place], [place] [address]  Health Maintenance for Postmenopausal Women Menopause is a normal process in which your ability to get pregnant comes to an end. This process happens slowly over many months or years, usually between the ages of 48 and 55. Menopause is complete when you have missed your menstrual periods for 12 months. It is important to talk with your health care provider about some of the most common conditions that affect women after menopause (postmenopausal women). These include heart disease, cancer, and bone loss (osteoporosis). Adopting a healthy lifestyle and getting preventive care can help to promote your health and wellness. The actions you take can also lower your chances of developing some of these common conditions. What should I know about menopause? During menopause, you may get a number of symptoms, such as:  Hot flashes. These can be moderate or severe.  Night sweats.  Decrease in sex drive.  Mood swings.  Headaches.  Tiredness.  Irritability.  Memory problems.  Insomnia. Choosing to treat or not to treat these symptoms is a decision that you make with your health care provider. Do I need hormone replacement therapy?  Hormone replacement therapy is effective in treating symptoms that are caused by menopause, such as hot flashes and night sweats.  Hormone replacement carries certain risks, especially as you become older. If you are thinking about using estrogen or estrogen with progestin, discuss the benefits and risks with your health care provider. What is my risk for heart disease and stroke? The risk of heart disease, heart attack, and stroke increases as you age. One of the causes may be a change in the body's hormones during menopause. This can affect how your body uses dietary fats, triglycerides, and cholesterol. Heart attack and stroke are medical  emergencies. There are many things that you can do to help prevent heart disease and stroke. Watch your blood pressure  High blood pressure causes heart disease and increases the risk of stroke. This is more likely to develop in people who have high blood pressure readings, are of African descent, or are overweight.  Have your blood pressure checked: ? Every 3-5 years if you are 18-39 years of age. ? Every year if you are 40 years old or older. Eat a healthy diet  Eat a diet that includes plenty of vegetables, fruits, low-fat dairy products, and lean protein.  Do not eat a lot of foods that are high in solid fats, added sugars, or sodium.   Get regular exercise Get regular exercise. This is one of the most important things you can do for your health. Most adults should:  Try to exercise for at least 150 minutes each week. The exercise should increase your heart rate and make you sweat (moderate-intensity exercise).  Try to do strengthening exercises at least twice each week. Do these in addition to the moderate-intensity exercise.  Spend less time sitting. Even light physical activity can be beneficial. Other tips  Work with your health care provider to achieve or maintain a healthy weight.  Do not use any products that contain nicotine or tobacco, such as cigarettes, e-cigarettes, and chewing tobacco. If you need help quitting, ask your health care provider.  Know your numbers. Ask your health care provider to check your cholesterol and your blood sugar (glucose). Continue to have your blood tested as directed by your health care provider. Do I need screening for cancer? Depending on   your health history and family history, you may need to have cancer screening at different stages of your life. This may include screening for:  Breast cancer.  Cervical cancer.  Lung cancer.  Colorectal cancer. What is my risk for osteoporosis? After menopause, you may be at increased risk for  osteoporosis. Osteoporosis is a condition in which bone destruction happens more quickly than new bone creation. To help prevent osteoporosis or the bone fractures that can happen because of osteoporosis, you may take the following actions:  If you are 19-50 years old, get at least 1,000 mg of calcium and at least 600 mg of vitamin D per day.  If you are older than age 50 but younger than age 70, get at least 1,200 mg of calcium and at least 600 mg of vitamin D per day.  If you are older than age 70, get at least 1,200 mg of calcium and at least 800 mg of vitamin D per day. Smoking and drinking excessive alcohol increase the risk of osteoporosis. Eat foods that are rich in calcium and vitamin D, and do weight-bearing exercises several times each week as directed by your health care provider. How does menopause affect my mental health? Depression may occur at any age, but it is more common as you become older. Common symptoms of depression include:  Low or sad mood.  Changes in sleep patterns.  Changes in appetite or eating patterns.  Feeling an overall lack of motivation or enjoyment of activities that you previously enjoyed.  Frequent crying spells. Talk with your health care provider if you think that you are experiencing depression. General instructions See your health care provider for regular wellness exams and vaccines. This may include:  Scheduling regular health, dental, and eye exams.  Getting and maintaining your vaccines. These include: ? Influenza vaccine. Get this vaccine each year before the flu season begins. ? Pneumonia vaccine. ? Shingles vaccine. ? Tetanus, diphtheria, and pertussis (Tdap) booster vaccine. Your health care provider may also recommend other immunizations. Tell your health care provider if you have ever been abused or do not feel safe at home. Summary  Menopause is a normal process in which your ability to get pregnant comes to an end.  This  condition causes hot flashes, night sweats, decreased interest in sex, mood swings, headaches, or lack of sleep.  Treatment for this condition may include hormone replacement therapy.  Take actions to keep yourself healthy, including exercising regularly, eating a healthy diet, watching your weight, and checking your blood pressure and blood sugar levels.  Get screened for cancer and depression. Make sure that you are up to date with all your vaccines. This information is not intended to replace advice given to you by your health care provider. Make sure you discuss any questions you have with your health care provider. Document Revised: 07/03/2018 Document Reviewed: 07/03/2018 Elsevier Patient Education  2021 [employer]  

## 2020-10-05 ENCOUNTER — Ambulatory Visit (INDEPENDENT_AMBULATORY_CARE_PROVIDER_SITE_OTHER): Payer: Medicare Other

## 2020-10-05 DIAGNOSIS — Z95810 Presence of automatic (implantable) cardiac defibrillator: Secondary | ICD-10-CM | POA: Diagnosis not present

## 2020-10-05 DIAGNOSIS — I5022 Chronic systolic (congestive) heart failure: Secondary | ICD-10-CM

## 2020-10-06 ENCOUNTER — Ambulatory Visit (INDEPENDENT_AMBULATORY_CARE_PROVIDER_SITE_OTHER): Payer: Medicare Other | Admitting: Student

## 2020-10-06 ENCOUNTER — Encounter: Payer: Self-pay | Admitting: Student

## 2020-10-06 ENCOUNTER — Ambulatory Visit (HOSPITAL_COMMUNITY)
Admission: RE | Admit: 2020-10-06 | Discharge: 2020-10-06 | Disposition: A | Discharge status: Home / Self Care | Payer: Medicare Other | Source: Ambulatory Visit | Attending: Internal Medicine | Admitting: Internal Medicine

## 2020-10-06 ENCOUNTER — Other Ambulatory Visit: Payer: Self-pay

## 2020-10-06 VITALS — BP 149/98 | HR 94 | Temp 97.9°F | Ht 63.0 in | Wt 195.4 lb

## 2020-10-06 DIAGNOSIS — M545 Low back pain, unspecified: Secondary | ICD-10-CM | POA: Diagnosis not present

## 2020-10-06 DIAGNOSIS — K5900 Constipation, unspecified: Secondary | ICD-10-CM

## 2020-10-06 DIAGNOSIS — E049 Nontoxic goiter, unspecified: Secondary | ICD-10-CM

## 2020-10-06 DIAGNOSIS — G8929 Other chronic pain: Secondary | ICD-10-CM | POA: Insufficient documentation

## 2020-10-06 DIAGNOSIS — E1165 Type 2 diabetes mellitus with hyperglycemia: Secondary | ICD-10-CM

## 2020-10-06 DIAGNOSIS — R1310 Dysphagia, unspecified: Secondary | ICD-10-CM | POA: Insufficient documentation

## 2020-10-06 DIAGNOSIS — R1319 Other dysphagia: Secondary | ICD-10-CM

## 2020-10-06 LAB — CUP PACEART REMOTE DEVICE CHECK
Battery Remaining Longevity: 33 mo
Battery Voltage: 2.96 V
Brady Statistic AP VP Percent: 0.16 %
Brady Statistic AP VS Percent: 0.01 %
Brady Statistic AS VP Percent: 99.77 %
Brady Statistic AS VS Percent: 0.06 %
Brady Statistic RA Percent Paced: 0.17 %
Brady Statistic RV Percent Paced: 99.79 %
Date Time Interrogation Session: 20220314012506
HighPow Impedance: 75 Ohm
Implantable Lead Implant Date: 20180524
Implantable Lead Implant Date: 20180524
Implantable Lead Implant Date: 20180524
Implantable Lead Location: 753858
Implantable Lead Location: 753859
Implantable Lead Location: 753860
Implantable Lead Model: 4398
Implantable Lead Model: 5076
Implantable Pulse Generator Implant Date: 20180524
Lead Channel Impedance Value: 189.525
Lead Channel Impedance Value: 193.707
Lead Channel Impedance Value: 199.5 Ohm
Lead Channel Impedance Value: 204.14 Ohm
Lead Channel Impedance Value: 204.14 Ohm
Lead Channel Impedance Value: 361 Ohm
Lead Channel Impedance Value: 399 Ohm
Lead Channel Impedance Value: 399 Ohm
Lead Channel Impedance Value: 418 Ohm
Lead Channel Impedance Value: 418 Ohm
Lead Channel Impedance Value: 418 Ohm
Lead Channel Impedance Value: 475 Ohm
Lead Channel Impedance Value: 608 Ohm
Lead Channel Impedance Value: 665 Ohm
Lead Channel Impedance Value: 665 Ohm
Lead Channel Impedance Value: 665 Ohm
Lead Channel Impedance Value: 684 Ohm
Lead Channel Impedance Value: 684 Ohm
Lead Channel Pacing Threshold Amplitude: 0.5 V
Lead Channel Pacing Threshold Amplitude: 0.625 V
Lead Channel Pacing Threshold Amplitude: 0.625 V
Lead Channel Pacing Threshold Pulse Width: 0.4 ms
Lead Channel Pacing Threshold Pulse Width: 0.4 ms
Lead Channel Pacing Threshold Pulse Width: 1 ms
Lead Channel Sensing Intrinsic Amplitude: 31.625 mV
Lead Channel Sensing Intrinsic Amplitude: 31.625 mV
Lead Channel Sensing Intrinsic Amplitude: 4.125 mV
Lead Channel Sensing Intrinsic Amplitude: 4.125 mV
Lead Channel Setting Pacing Amplitude: 0.75 V
Lead Channel Setting Pacing Amplitude: 1.5 V
Lead Channel Setting Pacing Amplitude: 2.5 V
Lead Channel Setting Pacing Pulse Width: 0.4 ms
Lead Channel Setting Pacing Pulse Width: 1 ms
Lead Channel Setting Sensing Sensitivity: 0.3 mV

## 2020-10-06 LAB — POCT GLYCOSYLATED HEMOGLOBIN (HGB A1C): Hemoglobin A1C: 6.4 % — AB (ref 4.0–5.6)

## 2020-10-06 LAB — GLUCOSE, CAPILLARY: Glucose-Capillary: 163 mg/dL — ABNORMAL HIGH (ref 70–99)

## 2020-10-06 MED ORDER — SENNOSIDES-DOCUSATE SODIUM 8.6-50 MG PO TABS: 1.0000 | ORAL_TABLET | Freq: Every day | ORAL | 0 refills | Status: DC

## 2020-10-06 NOTE — Progress Notes (Signed)
   CC: back/leg pain, diabetes follow-up  HPI:  [name] is a 55 y.o. with medical history as listed below presenting to [facility] for chronic back/leg pain and follow-up for diabetes.  Please see problem-based list for further details, assessments, and plans.  Past Medical History:  Diagnosis Date  . AICD (automatic cardioverter/defibrillator) present 12/13/2016  . Anxiety    no meds  . Asthma   . CHF (congestive heart failure) ([place])   . Depression    no meds  . Diabetes mellitus    recent dx 11/17/11 - started med 11/18/11 type 2  . GERD (gastroesophageal reflux disease)   . Goiter 07/27/2011   non-neoplastic goiter - fine needle aspiration - benign sees dr Dwyane Dee for  . Heart murmur    dx 2 yrs ago per pt  . Herpes genitalis in women   . Hypertension   . IBS (irritable bowel syndrome)    tx with diet per pt  . Low back pain    history  . Obesity   . Seasonal allergies   . Shortness of breath    occasional - exercise induced  . Sleep apnea    cpap broken  . Systolic heart failure    May 2011 EF 35-40%, 04/03/11 EF 25-30%, 06/27/11 EF 30-35%   Review of Systems:  As per HPi  Physical Exam:  Vitals:   10/06/20 1028  BP: (!) 149/98  Pulse: 94  Temp: 97.9 F (36.6 C)  TempSrc: Oral  SpO2: 98%  Weight: 195 lb 6.4 oz (88.6 kg)  Height: 5\' 3"  (1.6 m)   General: Pleasant, sitting comfortably in room in no acute distress HENT: Normocephalic, atraumatic. No thyroid nodules appreciated. CV: Regular rate, rhythm. No murmurs, rubs, gallops. Distal pulses 2+ bilaterally. Pulm: Normal work of breathing, clear to auscultation bilaterally. MSK: Normal bulk, tone. No pitting edema bilaterally. Lumbar bony spinal tenderness. R, L active and passive ROM normal. No effusions appreciated bilaterally. Hips and back without erythema or warmth.  Assessment & Plan:   See Encounters Tab for problem based charting.  Patient discussed with Dr. Heber Peck

## 2020-10-06 NOTE — Progress Notes (Signed)
Internal Medicine Clinic Attending ? ?Case discussed with Dr. Braswell  At the time of the visit.  We reviewed the resident?s history and exam and pertinent patient test results.  I agree with the assessment, diagnosis, and plan of care documented in the resident?s note.  ?

## 2020-10-06 NOTE — Assessment & Plan Note (Signed)
Patient mentions she has had chronic constipation and has seen GI for this problem in the past. Mentions she takes Miralax intermittently, but continues to experience constipation. Also has taken magnesium citrate in the past, which has worked well. Discussed trialing Senokot and following up with GI. Patient agreed with plan. - Start Senokot-S 1 tablet daily

## 2020-10-06 NOTE — Assessment & Plan Note (Signed)
Patient has been seen by endocrinology (Dr. Dwyane Dee) for multinodular goiter. Believes her thyroid could possibly be causing dysphagia. Explained results of recent thyroid ultrasound, which revealed stable thyroid nodules since 2016. Recent TSH normal as well. Denies recent skin changes, acute neck changes. She is requesting another referral to endocrinology to be seen by a different physician. Will place referral, defer further thyroid studies at this time. - Endocrinology referral

## 2020-10-06 NOTE — Assessment & Plan Note (Signed)
A1c improved today 6.4 from 6.7 last year. Susan Peck states she takes her insulin pen daily. She mentions her sugars are usually 100-150, rarely above or below. Denies hypoglycemic episodes. Reviewed her glucometer, sugars at goal and appropriate. Will continue with her current regimen and follow-up in 6 months. - Continue Xultophy 12u qd - A1c in 70mo

## 2020-10-06 NOTE — Assessment & Plan Note (Signed)
Patient reports she has had trouble swallowing solid foods. Endorses that she often feels as though food gets stuck in her esophagus. States this is a chronic issue and she was seen previously by Dr. Benson Norway with GI and discussed possible esophageal dilation. Encouraged her to make an appointment with GI for further evaluation. Possibly will need barium swallow study, will defer to Dr. Benson Norway.

## 2020-10-06 NOTE — Patient Instructions (Signed)
Mr./Mrs.  It was a pleasure seeing you today!  Today we discussed your back pain, leg pain, diabetes, and thyroid.  We are ordering an X-ray of your back. If negative, we will pursue further imaging of your back. In the meantime, I would suggest trying Lyrica again for the pain. You can double your nighttime dosage since you are experiencing symptoms the most during that time.  I recommend you follow-up with Dr. Benson Norway with gastroenterology for the food being stuck in your throat. I have also prescribed a new laxative called Senokot that you can take daily.  We will not need thyroid studies today, but I have referred you to a new endocrinologist to see.  We look forward to seeing you next time. Please call our clinic at [phone] if you have any questions or concerns. The best time to call is Monday-Friday from 9am-4pm, but there is someone available 24/7 at the same number. If you need medication refills, please notify your pharmacy one week in advance and they will send [place] a request.  Thank you for letting us take part in your care. Wishing you the best!  Thank you, Dr. Sanjuan Dame, MD

## 2020-10-06 NOTE — Assessment & Plan Note (Addendum)
Patient presenting today to discuss lower back and leg pain. She mentions she has had chronic back pain, but has worsened over the last year. Endorses frustration, as she says other providers have ignored her symptoms. She says the pain mainly occurs at night, but can occur any time that she lays down. Describes burning, sharp pain that radiates from back down to her toes bilaterally. She reports she previously tried gabapentin and pregabalin without relief. Denies fevers, injury, fall, IV drug use, history of cancer.  A/P: On exam, there is lumbar spinal tenderness. No history of fall or trauma, possible herniated disc compressing nerves, leading to her neuropathy. Will obtain x-ray of lumbar spine and consider MRI spine if negative. Discussed increasing her nighttime Lyrica for better pain control. Counseled patient she should take the 150mg  just before bed, as this can make her sleepy. If does not control pain, can consider switching to Cymbalta.  - F/u XR lumbar spine - Lyrica TID: 75mg  for first two doses, 150mg  nighttime dose  ADDENDUM: XR without acute abnormalities. Will obtain MRI for further evaluation.

## 2020-10-07 ENCOUNTER — Other Ambulatory Visit: Payer: Self-pay | Admitting: Student

## 2020-10-07 DIAGNOSIS — Z1231 Encounter for screening mammogram for malignant neoplasm of breast: Secondary | ICD-10-CM

## 2020-10-07 NOTE — Addendum Note (Signed)
Addended bySanjuan Dame on: 10/07/2020 11:44 AM   Modules accepted: Orders

## 2020-10-08 NOTE — Progress Notes (Signed)
EPIC Encounter for ICM Monitoring  Patient Name: Susan Peck is a 55 y.o. female Date: 10/08/2020 Primary Care Physican: Harvie Heck, MD Primary Cardiologist:Bensimhon Electrophysiologist:Klein Bi-V Pacing:99.8% 10/08/2020 Weight:195 lbs   Spoke with patient.  She reports she had pain last night in left arm and then pain moved to the defibrillator site until early hours this AM, 3/18.  Pain is currently resolved.  Weight stable at 193 lbs.  OptivolThoracic impedancenormal.  No abnormal episodes on report.   Prescribed: Furosemide40 mg take 2 tablets (80 mg total) every morning and 1 tablet (40 mg total) every evening.  Labs: 12/29/2019 Creatinine0.55, BUN11, Potassium3.6, Sodium141, GFR>60 A complete set of results can be found in Results Review.  Recommendations: Advised to use ER if needed for future pain in arm that radiates to chest area.    Follow-up plan: ICM clinic phone appointment on4/18/2022. 91 day device clinic remote transmission6/13/2022.   EP/Cardiology Office Visits:11/05/2020 with Dr Haroldine Laws.Recall 04/27/2021 with Dr Caryl Comes.  Copy of ICM check sent to [place].   3 month ICM trend: 10/08/2020.    1 Year ICM trend:       Susan Billings, RN 10/08/2020 9:16 AM

## 2020-10-13 ENCOUNTER — Encounter: Payer: Self-pay | Admitting: Dietician

## 2020-10-13 NOTE — Progress Notes (Signed)
Remote ICD transmission.   

## 2020-10-14 ENCOUNTER — Telehealth: Payer: Self-pay | Admitting: Internal Medicine

## 2020-10-14 NOTE — Telephone Encounter (Signed)
Called pt - stated when she went to the pharmacy to pick up meds, Cyclobenzaprine was one of the medications She stated she's not to take this med with her dx of CHF. Stated she continues to have muscle spasms and requesting a different medication. Send to [employer] . Thanks

## 2020-10-14 NOTE — Telephone Encounter (Signed)
Pt requesting a call back about a medication she was prescribed that her pharmacist said she can not have due to her CHF diagnosis.  Please call the patient back .

## 2020-10-15 ENCOUNTER — Other Ambulatory Visit: Payer: Self-pay | Admitting: Student

## 2020-10-15 DIAGNOSIS — M545 Low back pain, unspecified: Secondary | ICD-10-CM

## 2020-10-15 DIAGNOSIS — G8929 Other chronic pain: Secondary | ICD-10-CM

## 2020-10-15 MED ORDER — METHOCARBAMOL 500 MG PO TABS: 500.0000 mg | ORAL_TABLET | Freq: Three times a day (TID) | ORAL | 0 refills | Status: DC | PRN

## 2020-10-15 NOTE — Progress Notes (Signed)
She called regarding her concern with taking cyclobenzaprine due to her history of CHF.  Per aafp, cyclobenzaprine is contraindicated in patients with arrhythmias, recent MI or CHF.  Will prescribe methocarbamol, which is less sedating and not contraindicated for heart issue.  We will start with low-dose and monitor for tolerability.  -Stop cyclobenzaprine -Start methocarbamol 500 mg TID PRN

## 2020-10-15 NOTE — Telephone Encounter (Signed)
I called patient back.  See notes for detail

## 2020-10-18 ENCOUNTER — Other Ambulatory Visit: Payer: Self-pay | Admitting: Gastroenterology

## 2020-10-22 ENCOUNTER — Other Ambulatory Visit: Payer: Self-pay | Admitting: Internal Medicine

## 2020-10-22 DIAGNOSIS — I5022 Chronic systolic (congestive) heart failure: Secondary | ICD-10-CM

## 2020-11-02 ENCOUNTER — Other Ambulatory Visit: Payer: Self-pay

## 2020-11-02 ENCOUNTER — Ambulatory Visit (HOSPITAL_COMMUNITY)
Admission: RE | Admit: 2020-11-02 | Discharge: 2020-11-02 | Disposition: A | Discharge status: Home / Self Care | Payer: Medicare Other | Source: Ambulatory Visit | Attending: Internal Medicine | Admitting: Internal Medicine

## 2020-11-02 DIAGNOSIS — G8929 Other chronic pain: Secondary | ICD-10-CM | POA: Diagnosis present

## 2020-11-02 DIAGNOSIS — M545 Low back pain, unspecified: Secondary | ICD-10-CM | POA: Diagnosis not present

## 2020-11-02 NOTE — Progress Notes (Signed)
Per order, changed device settings for MRI to  DOO at 95 bpm  Tachy-therapies to off  Will program device back to pre-MRI settings after completion of exam, and send transmission.

## 2020-11-03 ENCOUNTER — Encounter: Payer: Self-pay | Admitting: Internal Medicine

## 2020-11-03 DIAGNOSIS — M545 Low back pain, unspecified: Secondary | ICD-10-CM

## 2020-11-03 DIAGNOSIS — G8929 Other chronic pain: Secondary | ICD-10-CM

## 2020-11-05 ENCOUNTER — Other Ambulatory Visit: Payer: Self-pay

## 2020-11-05 ENCOUNTER — Encounter (HOSPITAL_COMMUNITY): Payer: Self-pay | Admitting: Internal Medicine

## 2020-11-05 ENCOUNTER — Ambulatory Visit (HOSPITAL_COMMUNITY)
Admission: RE | Admit: 2020-11-05 | Discharge: 2020-11-05 | Disposition: A | Discharge status: Home / Self Care | Payer: Medicare Other | Source: Ambulatory Visit | Attending: Internal Medicine | Admitting: Internal Medicine

## 2020-11-05 VITALS — BP 136/88 | HR 86 | Wt 198.8 lb

## 2020-11-05 DIAGNOSIS — E119 Type 2 diabetes mellitus without complications: Secondary | ICD-10-CM | POA: Insufficient documentation

## 2020-11-05 DIAGNOSIS — I10 Essential (primary) hypertension: Secondary | ICD-10-CM

## 2020-11-05 DIAGNOSIS — I110 Hypertensive heart disease with heart failure: Secondary | ICD-10-CM | POA: Diagnosis not present

## 2020-11-05 DIAGNOSIS — E669 Obesity, unspecified: Secondary | ICD-10-CM | POA: Diagnosis not present

## 2020-11-05 DIAGNOSIS — I447 Left bundle-branch block, unspecified: Secondary | ICD-10-CM

## 2020-11-05 DIAGNOSIS — Z8249 Family history of ischemic heart disease and other diseases of the circulatory system: Secondary | ICD-10-CM | POA: Diagnosis not present

## 2020-11-05 DIAGNOSIS — I5022 Chronic systolic (congestive) heart failure: Secondary | ICD-10-CM

## 2020-11-05 DIAGNOSIS — Z79899 Other long term (current) drug therapy: Secondary | ICD-10-CM | POA: Diagnosis not present

## 2020-11-05 DIAGNOSIS — J45909 Unspecified asthma, uncomplicated: Secondary | ICD-10-CM | POA: Diagnosis not present

## 2020-11-05 DIAGNOSIS — R079 Chest pain, unspecified: Secondary | ICD-10-CM | POA: Diagnosis not present

## 2020-11-05 DIAGNOSIS — G4733 Obstructive sleep apnea (adult) (pediatric): Secondary | ICD-10-CM | POA: Diagnosis not present

## 2020-11-05 DIAGNOSIS — Z7982 Long term (current) use of aspirin: Secondary | ICD-10-CM | POA: Diagnosis not present

## 2020-11-05 DIAGNOSIS — Z01810 Encounter for preprocedural cardiovascular examination: Secondary | ICD-10-CM

## 2020-11-05 DIAGNOSIS — Z95810 Presence of automatic (implantable) cardiac defibrillator: Secondary | ICD-10-CM | POA: Insufficient documentation

## 2020-11-05 DIAGNOSIS — I428 Other cardiomyopathies: Secondary | ICD-10-CM | POA: Diagnosis not present

## 2020-11-05 DIAGNOSIS — Z794 Long term (current) use of insulin: Secondary | ICD-10-CM | POA: Diagnosis not present

## 2020-11-05 NOTE — Progress Notes (Addendum)
Advanced Heart Failure Clinic Note  Date:  11/05/2020   ID:  Susan Peck, DOB 04/19/1966, MRN [id number]  Location: Home  Provider location: [facility] Type of Visit: Established patient  PCP:  Harvie Heck, MD  Cardiologist:  No primary care provider on file. Primary HF: [place]  Chief Complaint: Heart Failure follow-up   History of Present Illness:  Susan Peck is a 55 year old woman with systolic heart failure, HTN, GERD,  multinodular goiter and asthma.   Diagnosed with systolic HF in 4680 with EF 25-30% Cath with normal cors. Felt to be LBBB related and received CRT-D with improvement in EF. Echo3/19 EF 45-50%  Echo 6/21 EF 55-60% RV normal.   Here for routine follow-up. Feels fatigued. Having soreness in her chest related to an esophageal stricture. Plans for dilation next week. Mild DOE. No edema. Struggling with back and leg pain. Hasn't been using CPAP regularly.   ICD interrogated: Volume status ok. Activity level 2 hr/day. 100% biv pacing    Cardiac studies:  Echo 3/19 EF improved to ~45-50%., GLS -14.8  04/03/2011  ECHO EF 25-30% 06/26/2012 ECHO EF 30-35%   11/18/12 ECHO EF 55% Inferior wall mildly hypokinetic with septal HK 11/14 Cardiac MRI  EF 52% No LGE. LBBB with dyssynergy 6/16: ECHO EF 35-40% 09/07/2015: ECHO EF 30-35%   CPX 11/20/2014  Peak VO2: 16.1 (76.8% predicted peak VO2) - when corrected to ideal BW pVO2 is 22.9 VE/VCO2 slope: 28.5 OUES: 1.59 Peak RER: 1.18  LHC/RHC 09/15/2015  RA = 2 RV = 22/1/2 PA = 27/8 (19) PCW = 8 Fick cardiac output/index = 5.22/2.8 PVR = 2.1 Ao sat = 98% PA sat = 70%, 71% Assessment: 1. Normal coronary arteries 2. Normal hemodynamics  3. LVEF 35-40% with global HK due to   [place] denies symptoms worrisome for COVID 19.   Past Medical History:  Diagnosis Date  . AICD (automatic cardioverter/defibrillator) present 12/13/2016  . Anxiety    no meds  . Asthma    . CHF (congestive heart failure) ([place])   . Depression    no meds  . Diabetes mellitus    recent dx 11/17/11 - started med 11/18/11 type 2  . GERD (gastroesophageal reflux disease)   . Goiter 07/27/2011   non-neoplastic goiter - fine needle aspiration - benign sees dr Dwyane Dee for  . Heart murmur    dx 2 yrs ago per pt  . Herpes genitalis in women   . Hypertension   . IBS (irritable bowel syndrome)    tx with diet per pt  . Low back pain    history  . Obesity   . Seasonal allergies   . Shortness of breath    occasional - exercise induced  . Sleep apnea    cpap broken  . Systolic heart failure    May 2011 EF 35-40%, 04/03/11 EF 25-30%, 06/27/11 EF 30-35%   Past Surgical History:  Procedure Laterality Date  . ABDOMINAL HYSTERECTOMY    . BIV ICD INSERTION CRT-D N/A 12/13/2016   Procedure: BiV ICD Insertion CRT-D;  Surgeon: Deboraha Sprang, MD;  Location: [facility];  Service: Cardiovascular;  Laterality: N/A;  . cardiac catherization  2004   [place], [place] - Dr Lynnell Jude  . CARDIAC CATHETERIZATION    . CARDIAC CATHETERIZATION N/A 09/15/2015   Procedure: Right/Left Heart Cath and Coronary Angiography;  Surgeon: Jolaine Artist, MD;  Location: [facility];  Service: Cardiovascular;  Laterality: N/A;  . COLONOSCOPY WITH PROPOFOL N/A 03/26/2015   Procedure: COLONOSCOPY WITH PROPOFOL;  Surgeon: Carol Ada, MD;  Location: [facility];  Service: Endoscopy;  Laterality: N/A;  . colonscopy    . CYSTOSCOPY  11/23/2011   Procedure: CYSTOSCOPY;  Surgeon: Jolayne Haines, MD;  Location: [facility];  Service: Gynecology;  Laterality: N/A;  . DIAGNOSTIC LAPAROSCOPY     of pelvis  . DILATION AND CURETTAGE OF UTERUS  12/2006,  10/2005   hysteroscopy surgery x 2  . INSERTION OF ICD  12/13/2016   BIV  . LAPAROSCOPIC ASSISTED VAGINAL HYSTERECTOMY  11/23/2011   Procedure: LAPAROSCOPIC ASSISTED VAGINAL HYSTERECTOMY;  Surgeon: Jolayne Haines, MD;  Location: [facility];  Service: Gynecology;   Laterality: N/A;  . NOVASURE ABLATION     10/2005  . SALPINGOOPHORECTOMY  11/23/2011   Procedure: SALPINGO OOPHERECTOMY;  Surgeon: Jolayne Haines, MD;  Location: [facility];  Service: Gynecology;  Laterality: Bilateral;  . SVD     x 3  . TOOTH EXTRACTION N/A 04/05/2017   Procedure: DENTAL EXTRACTIONS OF TEETH FIVE, SEVEN, SIXTEEN, SEVENTEEN;  Surgeon: Diona Browner, DDS;  Location: [facility];  Service: Oral Surgery;  Laterality: N/A;  . TUBAL LIGATION    . WISDOM TOOTH EXTRACTION       Current Outpatient Medications  Medication Sig Dispense Refill  . aspirin 81 MG chewable tablet Chew 81 mg by mouth daily.     . Aspirin-Acetaminophen-Caffeine (GOODY HEADACHE PO) Take 1 packet by mouth daily as needed (pain).    Marland Kitchen aspirin-sod bicarb-citric acid (ALKA-SELTZER) 325 MG TBEF tablet Take 650 mg by mouth every 6 (six) hours as needed (indigestion).    Marland Kitchen atorvastatin (LIPITOR) 40 MG tablet TAKE 1 TABLET BY MOUTH EVERY DAY (Patient taking differently: Take 40 mg by mouth daily.) 90 tablet 3  . BD PEN NEEDLE NANO 2ND GEN 32G X 4 MM MISC USE TO INJECT XULTOPHY DAILY 100 each 5  . calcium carbonate (TUMS - DOSED IN MG ELEMENTAL CALCIUM) 500 MG chewable tablet Chew 2,000 mg by mouth daily as needed for indigestion or heartburn.    . carvedilol (COREG) 25 MG tablet TAKE 1 TABLET (25 MG TOTAL) BY MOUTH 2 (TWO) TIMES DAILY WITH A MEAL. 180 tablet 3  . diclofenac Sodium (VOLTAREN) 1 % GEL APPLY 2 GRAMS TO AFFECTED AREA 4 TIMES A DAY (Patient taking differently: Apply 2 g topically 4 (four) times daily as needed (pain).) 100 g 1  . ENTRESTO 97-103 MG TAKE 1 TABLET BY MOUTH TWICE A DAY (Patient taking differently: Take 1 tablet by mouth 2 (two) times daily.) 60 tablet 2  . fluticasone (FLONASE) 50 MCG/ACT nasal spray PLACE 1 SPRAY INTO BOTH NOSTRILS 2 (TWO) TIMES DAILY. 48 mL 1  . furosemide (LASIX) 40 MG tablet TAKE 2 TABS EVERY MORNING AND 1 TAB EVERY EVENING. (Patient taking differently: Take 40-80 mg by mouth See  admin instructions. Take 80 mg in the morning and 40 mg in the evening) 270 tablet 0  . glucose blood (ONETOUCH VERIO) test strip Insulin dependent. Check blood sugar up to 3 times daily. diag code E11.65 100 each 12  . Insulin Degludec-Liraglutide (XULTOPHY) 100-3.6 UNIT-MG/ML SOPN Inject 12 Units/day into the skin at bedtime. (Patient taking differently: Inject 12 Units/day into the skin daily.) 15 pen 11  . Lancet Devices (ONETOUCH DELICA PLUS LANCING) MISC USE AS DIRECTED TO CHECK BLOOD SUGAR 1 each 2  . linaclotide (LINZESS) 145 MCG CAPS capsule  Take 145 mcg by mouth daily before breakfast.    . methocarbamol (ROBAXIN) 500 MG tablet Take 1 tablet (500 mg total) by mouth 3 (three) times daily as needed for muscle spasms. 60 tablet 0  . mupirocin cream (BACTROBAN) 2 % APPLY TO AFFECTED AREA TWICE A DAY (Patient taking differently: Apply 1 application topically 2 (two) times daily.) 15 g 1  . ONETOUCH DELICA LANCETS FINE MISC Insulin dependent. Check blood sugar up to 3 times daily. diag code E11.65 100 each 12  . PATADAY 0.2 % SOLN Place 1 drop into both eyes daily.  4  . polyethylene glycol (MIRALAX / GLYCOLAX) packet Take 17 g by mouth daily. (Patient taking differently: Take 17 g by mouth daily as needed for moderate constipation.) 14 each 0  . pregabalin (LYRICA) 75 MG capsule Take 1 capsule (75 mg total) by mouth 3 (three) times daily. 90 capsule 1  . senna-docusate (SENOKOT-S) 8.6-50 MG tablet Take 1 tablet by mouth daily. 30 tablet 0  . VYZULTA 0.024 % SOLN Place 1 drop into both eyes at bedtime.     No current facility-administered medications for this encounter.    Allergies:   Shellfish-derived products   Social History:  The patient  reports that she has never smoked. She has never used smokeless tobacco. She reports previous alcohol use. She reports that she does not use drugs.   Family History:  The patient's family history includes Breast cancer in her maternal aunt and mother;  Diabetes in her maternal grandfather, maternal grandmother, paternal grandfather, and paternal grandmother; Heart disease in her maternal aunt and maternal aunt; Hypertension in her maternal grandmother and paternal grandfather; Thyroid disease in her mother.   ROS:  Please see the history of present illness.   All other systems are personally reviewed and negative.   Vitals:   11/05/20 1518  BP: 136/88  Pulse: 86  SpO2: 94%  Weight: 90.2 kg (198 lb 12.8 oz)    Exam:   General:  Fatigued appearing. No resp difficulty HEENT: normal Neck: supple. no JVD. Carotids 2+ bilat; no bruits. No lymphadenopathy or thryomegaly appreciated. Cor: PMI nondisplaced. Regular rate & rhythm. No rubs, gallops or murmurs. Lungs: clear Abdomen: obese soft, nontender, nondistended. No hepatosplenomegaly. No bruits or masses. Good bowel sounds. Extremities: no cyanosis, clubbing, rash, edema Neuro: alert & orientedx3, cranial nerves grossly intact. moves all 4 extremities w/o difficulty. Affect pleasant  Recent Labs: 12/29/2019: B Natriuretic Peptide 51.3; BUN 11; Creatinine, Ser 0.55; Hemoglobin 13.7; Platelets 238; Potassium 3.6; Sodium 141 01/23/2020: TSH 1.950  Personally reviewed   Wt Readings from Last 3 Encounters:  10/06/20 88.6 kg (195 lb 6.4 oz)  10/04/20 88.5 kg (195 lb)  04/27/20 87.5 kg (193 lb)      ASSESSMENT AND PLAN:  1. Chronic systolic HF: Nonischemic cardiomyopathy (LBBB cardiomyopathy).   - 08/2015 ECHO EF 35-40% - Echo 2/19 LVEF 45-50%.s/p CRT-P  - Echo 6/21 EF 55-60% RV normal. -> EF completely recovered with CRT - Stable NYHA II-III symptoms most due to non-cardiac issues - Volume status stable on exam and by optivol - Continue lasix 80/40 - Continue inspra 12.5 mg daily. ( Intolerant to spiro with painful gynecomastia) - Continue coreg 25 mg BID.  - Intolerant to Bidil with severe headaches.  - Continue entresto 97/103 mg BID.  - Failed Jardiance due to nausea - ICD  interrogated as above   2. Chest pain - Improved LHC 08/2015 with normal cors.    3.  HTN:  - Blood pressure well controlled. Continue current regimen.  4. OSA:  - encouraged CPAP  5. DM II: followed by  Endocrinologist.  - Failed Jardiance   6. Pre-op risk stratification for esophageal dilation - EF has recovered completely. No CAD on cath - low risk from a cardiac standpoint   Signed, Glori Bickers, MD  11/05/2020 3:15 PM  [facility] [address] Heart and [place] [address] [phone] (office) [phone] (fax)

## 2020-11-05 NOTE — Addendum Note (Signed)
Encounter addended by: Scarlette Calico, RN on: 11/05/2020 3:55 PM  Actions taken: Clinical Note Signed

## 2020-11-05 NOTE — Addendum Note (Signed)
Encounter addended by: Scarlette Calico, RN on: 11/05/2020 4:03 PM  Actions taken: Visit diagnoses modified, Order list changed, Diagnosis association updated, Specialty comments modified

## 2020-11-05 NOTE — Patient Instructions (Signed)
Congradulations!!! You have graduated the [place] have been referred to [facility], they will call you for an appointment in 6 months  If you have any questions or concerns before your next appointment please send [place] a message through [place] or call our office at [phone].    TO LEAVE A MESSAGE FOR THE NURSE SELECT OPTION 2, PLEASE LEAVE A MESSAGE INCLUDING: . YOUR NAME . DATE OF BIRTH . CALL BACK NUMBER . REASON FOR CALL**this is important as we prioritize the call backs  YOU WILL RECEIVE A CALL BACK THE SAME DAY AS LONG AS YOU CALL BEFORE 4:00 PM

## 2020-11-08 ENCOUNTER — Ambulatory Visit (INDEPENDENT_AMBULATORY_CARE_PROVIDER_SITE_OTHER): Payer: Medicare Other

## 2020-11-08 ENCOUNTER — Other Ambulatory Visit (HOSPITAL_COMMUNITY): Payer: Self-pay | Admitting: Internal Medicine

## 2020-11-08 DIAGNOSIS — Z95810 Presence of automatic (implantable) cardiac defibrillator: Secondary | ICD-10-CM

## 2020-11-08 DIAGNOSIS — I5022 Chronic systolic (congestive) heart failure: Secondary | ICD-10-CM | POA: Diagnosis not present

## 2020-11-08 MED ORDER — NAPROXEN 500 MG PO TABS: 500.0000 mg | ORAL_TABLET | Freq: Two times a day (BID) | ORAL | 0 refills | Status: AC

## 2020-11-08 NOTE — Telephone Encounter (Signed)
Returned call for patient acute on chronic low back pain. MRI  Lumbar Spine 11/02/2020 reviewed with patient, impression below:  Progression of degenerative disease at L5-S1 where a large central protrusion causes moderate to moderately severe central canal stenosis and narrowing of both subarticular recesses with encroachment on the S1 roots. There is also mild bilateral foraminal narrowing at this level.  Shallow disc bulge and moderate facet arthropathy at L4-5 have progressed somewhat since the prior exam. There is mild central canal stenosis at L4-5.  She was prescribed Methocarbamol recently, but has not started this medication. We discussed taking  1500 mg TID for the first two days and then taking the 500 mg TID dose for 8 days. Patient has history of systolic heart failure, but a 10 day prescription of Naproxen is appropriate for acute pain. In addition will refer patient to physical therapy. Encouraged weight loss. Recommended follow- up if back and leg pain do not improved.   1. Chronic low back pain, unspecified back pain laterality, unspecified whether sciatica present  - naproxen (NAPROSYN) 500 MG tablet; Take 1 tablet (500 mg total) by mouth 2 (two) times daily with a meal for 10 days.  Dispense: 20 tablet; Refill: 0 - Ambulatory referral to Physical Therapy

## 2020-11-09 ENCOUNTER — Other Ambulatory Visit: Payer: Self-pay

## 2020-11-09 ENCOUNTER — Encounter (HOSPITAL_COMMUNITY): Payer: Self-pay | Admitting: Gastroenterology

## 2020-11-10 ENCOUNTER — Other Ambulatory Visit (HOSPITAL_COMMUNITY)
Admission: RE | Admit: 2020-11-10 | Discharge: 2020-11-10 | Disposition: A | Discharge status: Home / Self Care | Payer: Medicare Other | Source: Ambulatory Visit | Attending: Gastroenterology | Admitting: Gastroenterology

## 2020-11-10 DIAGNOSIS — Z01812 Encounter for preprocedural laboratory examination: Secondary | ICD-10-CM | POA: Insufficient documentation

## 2020-11-10 DIAGNOSIS — Z20822 Contact with and (suspected) exposure to covid-19: Secondary | ICD-10-CM | POA: Diagnosis not present

## 2020-11-10 LAB — SARS CORONAVIRUS 2 (TAT 6-24 HRS): SARS Coronavirus 2: NEGATIVE

## 2020-11-12 ENCOUNTER — Ambulatory Visit (HOSPITAL_COMMUNITY)
Admission: RE | Admit: 2020-11-12 | Discharge: 2020-11-12 | Disposition: A | Discharge status: Home / Self Care | Payer: Medicare Other | Attending: Gastroenterology | Admitting: Gastroenterology

## 2020-11-12 ENCOUNTER — Other Ambulatory Visit: Payer: Self-pay

## 2020-11-12 ENCOUNTER — Ambulatory Visit (HOSPITAL_COMMUNITY): Payer: Medicare Other | Admitting: Anesthesiology

## 2020-11-12 ENCOUNTER — Encounter (HOSPITAL_COMMUNITY)
Admission: RE | Disposition: A | Discharge status: Home / Self Care | Payer: Self-pay | Source: Home / Self Care | Attending: Gastroenterology

## 2020-11-12 DIAGNOSIS — Z95810 Presence of automatic (implantable) cardiac defibrillator: Secondary | ICD-10-CM | POA: Insufficient documentation

## 2020-11-12 DIAGNOSIS — K5900 Constipation, unspecified: Secondary | ICD-10-CM | POA: Diagnosis not present

## 2020-11-12 DIAGNOSIS — R1310 Dysphagia, unspecified: Secondary | ICD-10-CM | POA: Insufficient documentation

## 2020-11-12 HISTORY — PX: ESOPHAGOGASTRODUODENOSCOPY (EGD) WITH PROPOFOL: SHX5813

## 2020-11-12 HISTORY — PX: SAVORY DILATION: SHX5439

## 2020-11-12 LAB — GLUCOSE, CAPILLARY: Glucose-Capillary: 150 mg/dL — ABNORMAL HIGH (ref 70–99)

## 2020-11-12 SURGERY — ESOPHAGOGASTRODUODENOSCOPY (EGD) WITH PROPOFOL
Anesthesia: Monitor Anesthesia Care

## 2020-11-12 MED ORDER — LACTATED RINGERS IV SOLN: INTRAVENOUS | Status: DC

## 2020-11-12 MED ORDER — PROPOFOL 500 MG/50ML IV EMUL
INTRAVENOUS | Status: DC | PRN
  Administered 2020-11-12: 70 mg via INTRAVENOUS
  Administered 2020-11-12: 170 ug/kg/min via INTRAVENOUS

## 2020-11-12 MED ORDER — SODIUM CHLORIDE 0.9 % IV SOLN: INTRAVENOUS | Status: DC

## 2020-11-12 SURGICAL SUPPLY — 14 items

## 2020-11-12 NOTE — Discharge Instructions (Signed)

## 2020-11-12 NOTE — Anesthesia Postprocedure Evaluation (Signed)
Anesthesia Post Note  Patient: Susan Peck  Procedure(s) Performed: ESOPHAGOGASTRODUODENOSCOPY (EGD) WITH PROPOFOL (N/A ) SAVORY DILATION (N/A )     Patient location during evaluation: PACU Anesthesia Type: MAC Level of consciousness: awake and alert Pain management: pain level controlled Vital Signs Assessment: post-procedure vital signs reviewed and stable Respiratory status: spontaneous breathing, nonlabored ventilation and respiratory function stable Cardiovascular status: stable and blood pressure returned to baseline Anesthetic complications: no   No complications documented.  Last Vitals:  Vitals:   11/12/20 1220 11/12/20 1230  BP: 123/67 (!) 142/80  Pulse: 73 70  Resp: (!) 21 16  Temp:    SpO2: 95% 98%    Last Pain:  Vitals:   11/12/20 1230  TempSrc:   PainSc: 0-No pain                 Audry Pili

## 2020-11-12 NOTE — Anesthesia Preprocedure Evaluation (Addendum)
Anesthesia Evaluation  Patient identified by MRN, date of birth, ID band Patient awake    Reviewed: Allergy & Precautions, NPO status , Patient's Chart, lab work & pertinent test results, reviewed documented beta blocker date and time   History of Anesthesia Complications Negative for: history of anesthetic complications  Airway Mallampati: II  TM Distance: >3 FB Neck ROM: Full    Dental  (+) Dental Advisory Given, Partial Upper   Pulmonary asthma , sleep apnea and Continuous Positive Airway Pressure Ventilation ,    Pulmonary exam normal        Cardiovascular hypertension, Pt. on medications and Pt. on home beta blockers Normal cardiovascular exam+ Cardiac Defibrillator    '21 TTE - EF 55 to 60% (recovered from 20-25% in 2018). Mild concentric left ventricular hypertrophy. Grade I diastolic dysfunction (impaired relaxation). Trivial mitral valve regurgitation.     Neuro/Psych PSYCHIATRIC DISORDERS Anxiety Depression negative neurological ROS     GI/Hepatic Neg liver ROS, GERD  Controlled, IBS    Endo/Other  diabetes, Type 2, Insulin Dependent Benign goiter Obesity   Renal/GU negative Renal ROS     Musculoskeletal negative musculoskeletal ROS (+)   Abdominal   Peds  Hematology negative hematology ROS (+)   Anesthesia Other Findings Covid test negative HSV  Reproductive/Obstetrics                            Anesthesia Physical Anesthesia Plan  ASA: III  Anesthesia Plan: MAC   Post-op Pain Management:    Induction: Intravenous  PONV Risk Score and Plan: 2 and Propofol infusion and Treatment may vary due to age or medical condition  Airway Management Planned: Nasal Cannula and Natural Airway  Additional Equipment: None  Intra-op Plan:   Post-operative Plan:   Informed Consent: I have reviewed the patients History and Physical, chart, labs and discussed the procedure  including the risks, benefits and alternatives for the proposed anesthesia with the patient or authorized representative who has indicated his/her understanding and acceptance.       Plan Discussed with: CRNA and Anesthesiologist  Anesthesia Plan Comments:        Anesthesia Quick Evaluation

## 2020-11-12 NOTE — Op Note (Addendum)
[facility] Patient Name: Susan Peck Procedure Date: 11/12/2020 MRN: [id number] Attending MD: Carol Ada , MD Date of Birth: Jan 03, 1966 CSN: [id number] Age: 55 Admit Type: Outpatient Procedure:                Upper GI endoscopy Indications:              Dysphagia Providers:                Carol Ada, MD, Josie Dixon, RN, Fransico Setters                            [place], Technician Referring MD:              Medicines:                Propofol per Anesthesia Complications:            No immediate complications. Estimated Blood Loss:     Estimated blood loss: none. Procedure:                Pre-Anesthesia Assessment:                           - Prior to the procedure, a History and Physical                            was performed, and patient medications and                            allergies were reviewed. The patient's tolerance of                            previous anesthesia was also reviewed. The risks                            and benefits of the procedure and the sedation                            options and risks were discussed with the patient.                            All questions were answered, and informed consent                            was obtained. Prior Anticoagulants: The patient has                            taken no previous anticoagulant or antiplatelet                            agents. ASA Grade Assessment: III - A patient with                            severe systemic disease. After reviewing the risks                            and benefits,  the patient was deemed in                            satisfactory condition to undergo the procedure.                           - Sedation was administered by an anesthesia                            professional. Deep sedation was attained.                           After obtaining informed consent, the endoscope was                            passed under direct vision. Throughout the                             procedure, the patient's blood pressure, pulse, and                            oxygen saturations were monitored continuously. The                            [id number]-[id number] [id number]) was introduced through the                            mouth, and advanced to the second part of duodenum.                            The upper GI endoscopy was accomplished without                            difficulty. The patient tolerated the procedure                            well. Scope In: Scope Out: Findings:      No endoscopic abnormality was evident in the esophagus to explain the       patient's complaint of dysphagia. It was decided, however, to proceed       with dilation of the entire esophagus. A guidewire was placed and the       scope was withdrawn. Dilation was performed with a Savary dilator with       no resistance at 18 mm. The dilation site was examined following       endoscope reinsertion and showed no change.      The stomach was normal.      The examined duodenum was normal. Impression:               - No endoscopic esophageal abnormality to explain                            patient's dysphagia. Esophagus dilated. Dilated.                           -  Normal stomach.                           - Normal examined duodenum.                           - No specimens collected. Moderate Sedation:      Not Applicable - Patient had care per Anesthesia. Recommendation:           - Patient has a contact number available for                            emergencies. The signs and symptoms of potential                            delayed complications were discussed with the                            patient. Return to normal activities tomorrow.                            Written discharge instructions were provided to the                            patient.                           - Resume previous diet.                           - Continue present medications.                            - Return to GI clinic in 4 weeks. Procedure Code(s):        --- Professional ---                           [phone], Esophagogastroduodenoscopy, flexible,                            transoral; with insertion of guide wire followed by                            passage of dilator(s) through esophagus over guide                            wire Diagnosis Code(s):        --- Professional ---                           R13.10, Dysphagia, unspecified CPT copyright 2019 [employer] Association. All rights reserved. The codes documented in this report are preliminary and upon coder review may  be revised to meet current compliance requirements. Carol Ada, MD Carol Ada, MD 11/12/2020 12:02:39 PM This report has been signed electronically. Number of Addenda: 0

## 2020-11-12 NOTE — Transfer of Care (Signed)
Immediate Anesthesia Transfer of Care Note  Patient: Susan Peck  Procedure(s) Performed: Procedure(s): ESOPHAGOGASTRODUODENOSCOPY (EGD) WITH PROPOFOL (N/A) SAVORY DILATION (N/A)  Patient Location: PACU and Endoscopy Unit  Anesthesia Type:MAC  Level of Consciousness: awake, alert  and oriented  Airway & Oxygen Therapy: Patient Spontanous Breathing and Patient connected to nasal cannula oxygen  Post-op Assessment: Report given to RN and Post -op Vital signs reviewed and stable  Post vital signs: Reviewed and stable  Last Vitals:  Vitals:   11/12/20 1026 11/12/20 1204  BP: 113/60   Pulse: 73 74  Resp: 19 20  Temp: 36.8 C 36.8 C  SpO2: 79% 39%    Complications: No apparent anesthesia complications

## 2020-11-12 NOTE — Progress Notes (Signed)
EPIC Encounter for ICM Monitoring  Patient Name: Susan Peck is a 55 y.o. female Date: 11/12/2020 Primary Care Physican: Harvie Heck, MD Primary Cardiologist:Bensimhon Electrophysiologist:Klein Bi-V Pacing:99.8% 10/08/2020 Weight:195 lbs   Transmission Reviewed.  Pt seen in office by Dr Haroldine Laws on 4/15.  EF has completed recovered per Advanced HF note and she has been referred back to [facility] for future follow up.   OptivolThoracic impedancenormal.    Prescribed: Furosemide40 mg take 2 tablets (80 mg total) every morning and 1 tablet (40 mg total) every evening.  Labs: 12/29/2019 Creatinine0.55, BUN11, Potassium3.6, Sodium141, GFR>60 A complete set of results can be found in Results Review.  Recommendations: No changes.    Follow-up plan: ICM clinic phone appointment on6/14/2022. 91 day device clinic remote transmission6/13/2022.   EP/Cardiology Office Visits:Recall 04/27/2021 with Dr Caryl Comes.  Copy of ICM check sent to [place].  3 month ICM trend: 11/08/2020.    1 Year ICM trend:       [name], RN 11/12/2020 3:56 PM

## 2020-11-12 NOTE — H&P (Signed)
Susan Peck   HPI: For the past year the patient reports having problems with constipation. She tried over-the-counter laxatives on a semi-routine basis with intermittent results. At times the laxatives worked and other times it did not resolve any of her issues. The constiaption causes her to feel bloated and she states that she may not have a bowel movement for a week at a times. She denies any problems with hematochezia or melena. She had colonoscopies in 2008 and 2016 without any findings of adenomas. In 2018 she was evaluated for an EGD as she complained about dysphagia, but she cancelled the appointment. At that time she recalls having her AICD placed. Her ER on 01/15/2020 was 55-60% and she has normal wall motion.    Past Medical History:  Diagnosis Date  . AICD (automatic cardioverter/defibrillator) present 12/13/2016  . Anxiety    no meds  . Asthma   . CHF (congestive heart failure) ([place])   . Depression    no meds  . Diabetes mellitus    recent dx 11/17/11 - started med 11/18/11 type 2  . GERD (gastroesophageal reflux disease)   . Goiter 07/27/2011   non-neoplastic goiter - fine needle aspiration - benign sees dr Dwyane Dee for  . Heart murmur    dx 2 yrs ago per pt  . Herpes genitalis in women   . Hypertension   . IBS (irritable bowel syndrome)    tx with diet per pt  . Low back pain    history  . Obesity   . Seasonal allergies   . Shortness of breath    occasional - exercise induced  . Sleep apnea    cpap broken  . Systolic heart failure    May 2011 EF 35-40%, 04/03/11 EF 25-30%, 06/27/11 EF 30-35%    Past Surgical History:  Procedure Laterality Date  . ABDOMINAL HYSTERECTOMY    . BIV ICD INSERTION CRT-D N/A 12/13/2016   Procedure: BiV ICD Insertion CRT-D;  Surgeon: Deboraha Sprang, MD;  Location: [facility];  Service: Cardiovascular;  Laterality: N/A;  . cardiac catherization  2004   [place], [place] - Dr Lynnell Jude  . CARDIAC CATHETERIZATION    .  CARDIAC CATHETERIZATION N/A 09/15/2015   Procedure: Right/Left Heart Cath and Coronary Angiography;  Surgeon: Jolaine Artist, MD;  Location: [facility];  Service: Cardiovascular;  Laterality: N/A;  . COLONOSCOPY WITH PROPOFOL N/A 03/26/2015   Procedure: COLONOSCOPY WITH PROPOFOL;  Surgeon: Carol Ada, MD;  Location: [facility];  Service: Endoscopy;  Laterality: N/A;  . colonscopy    . CYSTOSCOPY  11/23/2011   Procedure: CYSTOSCOPY;  Surgeon: Jolayne Haines, MD;  Location: [facility];  Service: Gynecology;  Laterality: N/A;  . DIAGNOSTIC LAPAROSCOPY     of pelvis  . DILATION AND CURETTAGE OF UTERUS  12/2006,  10/2005   hysteroscopy surgery x 2  . INSERTION OF ICD  12/13/2016   BIV  . LAPAROSCOPIC ASSISTED VAGINAL HYSTERECTOMY  11/23/2011   Procedure: LAPAROSCOPIC ASSISTED VAGINAL HYSTERECTOMY;  Surgeon: Jolayne Haines, MD;  Location: [facility];  Service: Gynecology;  Laterality: N/A;  . NOVASURE ABLATION     10/2005  . SALPINGOOPHORECTOMY  11/23/2011   Procedure: SALPINGO OOPHERECTOMY;  Surgeon: Jolayne Haines, MD;  Location: [facility];  Service: Gynecology;  Laterality: Bilateral;  . SVD     x 3  . TOOTH EXTRACTION N/A 04/05/2017   Procedure: DENTAL EXTRACTIONS OF TEETH FIVE, SEVEN, SIXTEEN, SEVENTEEN;  Surgeon: Diona Browner,  DDS;  Location: [place];  Service: Oral Surgery;  Laterality: N/A;  . TUBAL LIGATION    . WISDOM TOOTH EXTRACTION      Family History  Problem Relation Age of Onset  . Heart disease Maternal Aunt   . Breast cancer Mother   . Thyroid disease Mother   . Hypertension Maternal Grandmother   . Diabetes Maternal Grandmother   . Breast cancer Maternal Aunt   . Heart disease Maternal Aunt   . Diabetes Maternal Grandfather   . Diabetes Paternal Grandmother   . Diabetes Paternal Grandfather   . Hypertension Paternal Grandfather     Social History:  reports that she has never smoked. She has never used smokeless tobacco. She reports previous alcohol use. She reports that  she does not use drugs.  Allergies:  Allergies  Allergen Reactions  . Shellfish-Derived Products Anaphylaxis    Medications:  Scheduled:  Continuous: . lactated ringers 20 mL/hr at 11/12/20 1038    Results for orders placed or performed during the hospital encounter of 11/12/20 (from the past 24 hour(s))  Glucose, capillary     Status: Abnormal   Collection Time: 11/12/20 10:42 AM  Result Value Ref Range   Glucose-Capillary 150 (H) 70 - 99 mg/dL     No results found.  ROS:  As stated above in the HPI otherwise negative.  Blood pressure 113/60, pulse 73, temperature 98.2 F (36.8 C), temperature source Oral, resp. rate 19, height 5\' 3"  (1.6 m), weight 89.8 kg, SpO2 97 %.    PE: Gen: NAD, Alert and Oriented HEENT:  [place]/AT, EOMI Neck: Supple, no LAD Lungs: CTA Bilaterally CV: RRR without M/G/R ABD: Soft, NTND, +BS Ext: No C/C/E  Assessment/Plan: 1) Dysphagia - EGD with dilation.  Reginna Sermeno D 11/12/2020, 11:41 AM

## 2020-11-15 ENCOUNTER — Encounter: Payer: Self-pay | Admitting: *Deleted

## 2020-11-15 ENCOUNTER — Encounter (HOSPITAL_COMMUNITY): Payer: Self-pay | Admitting: Gastroenterology

## 2020-11-18 ENCOUNTER — Other Ambulatory Visit: Payer: Self-pay | Admitting: Internal Medicine

## 2020-11-18 DIAGNOSIS — E1165 Type 2 diabetes mellitus with hyperglycemia: Secondary | ICD-10-CM

## 2020-11-24 NOTE — Telephone Encounter (Signed)
Call to pharmacy.  No PA is needed.  Was put in as a 25 day supply. Information was changed/updated.   Not in stock will be available for pick up on tomorrow.

## 2020-11-25 ENCOUNTER — Telehealth: Payer: Self-pay | Admitting: Dietician

## 2020-11-25 NOTE — Telephone Encounter (Signed)
Susan Peck called asking about a PA for her Xultophy. I read hr Susan Peck's note form her conversation about a PA with her pharmacy. Susan Peck said she'd call them and call us back if needed.

## 2020-11-30 ENCOUNTER — Other Ambulatory Visit: Payer: Self-pay | Admitting: Student

## 2020-11-30 DIAGNOSIS — G8929 Other chronic pain: Secondary | ICD-10-CM

## 2020-11-30 NOTE — Telephone Encounter (Signed)
Patient was prescribed short course in April. Not appropriate for refill without re-evaluation.

## 2020-12-01 ENCOUNTER — Other Ambulatory Visit: Payer: Self-pay

## 2020-12-01 ENCOUNTER — Ambulatory Visit
Admission: RE | Admit: 2020-12-01 | Discharge: 2020-12-01 | Disposition: A | Discharge status: Home / Self Care | Payer: Medicare Other | Source: Ambulatory Visit | Attending: Nurse Practitioner | Admitting: Nurse Practitioner

## 2020-12-01 DIAGNOSIS — Z1231 Encounter for screening mammogram for malignant neoplasm of breast: Secondary | ICD-10-CM

## 2020-12-02 ENCOUNTER — Ambulatory Visit: Payer: Medicare Other | Attending: Internal Medicine

## 2020-12-02 DIAGNOSIS — M5441 Lumbago with sciatica, right side: Secondary | ICD-10-CM | POA: Diagnosis present

## 2020-12-02 DIAGNOSIS — M5442 Lumbago with sciatica, left side: Secondary | ICD-10-CM | POA: Insufficient documentation

## 2020-12-02 DIAGNOSIS — M6281 Muscle weakness (generalized): Secondary | ICD-10-CM

## 2020-12-02 DIAGNOSIS — G8929 Other chronic pain: Secondary | ICD-10-CM | POA: Insufficient documentation

## 2020-12-02 DIAGNOSIS — R262 Difficulty in walking, not elsewhere classified: Secondary | ICD-10-CM | POA: Diagnosis present

## 2020-12-02 DIAGNOSIS — R293 Abnormal posture: Secondary | ICD-10-CM | POA: Insufficient documentation

## 2020-12-02 NOTE — Patient Instructions (Signed)
Access Code: [email or web address] URL: [email or web address] Date: 12/02/2020 Prepared by: Kathreen Cornfield  Exercises Supine Figure 4 Piriformis Stretch - 1-2 x daily - 7 x weekly - 2-3 sets - 20-30 seconds hold Supine Piriformis Stretch with Foot on Ground - 1-2 x daily - 7 x weekly - 2-3 sets - 20-30 seconds hold Hip Flexor Stretch at Edge of Bed - 1-2 x daily - 7 x weekly - 2-3 sets - 20-30 seconds hold Supine Posterior Pelvic Tilt - 2 x daily - 7 x weekly - 2 sets - 10 reps - 5 seconds hold Open Books - 2 x daily - 7 x weekly - 1-2 sets - 10-15 reps

## 2020-12-02 NOTE — Therapy (Signed)
[place], [place], [address] Phone: [phone]   Fax:  [phone]  Physical Therapy Evaluation  Patient Details  Name: Susan Peck MRN: [id number] Date of Birth: 05/07/1966 Referring Provider (PT): Lucious Groves, DO   Encounter Date: 12/02/2020   PT End of Session - 12/02/20 1911    Visit Number 1    Number of Visits 16    Date for PT Re-Evaluation 01/27/21    Authorization Type [employer] Medicare    Authorization Time Period Foto Visit 6 and 10    Progress Note Due on Visit 10    PT Start Time 9528    PT Stop Time 1745    PT Time Calculation (min) 55 min    Activity Tolerance Patient tolerated treatment well    Behavior During Therapy [facility] for tasks assessed/performed           Past Medical History:  Diagnosis Date  . AICD (automatic cardioverter/defibrillator) present 12/13/2016  . Anxiety    no meds  . Asthma   . CHF (congestive heart failure) ([place])   . Depression    no meds  . Diabetes mellitus    recent dx 11/17/11 - started med 11/18/11 type 2  . GERD (gastroesophageal reflux disease)   . Goiter 07/27/2011   non-neoplastic goiter - fine needle aspiration - benign sees dr Dwyane Dee for  . Heart murmur    dx 2 yrs ago per pt  . Herpes genitalis in women   . Hypertension   . IBS (irritable bowel syndrome)    tx with diet per pt  . Low back pain    history  . Obesity   . Seasonal allergies   . Shortness of breath    occasional - exercise induced  . Sleep apnea    cpap broken  . Systolic heart failure    May 2011 EF 35-40%, 04/03/11 EF 25-30%, 06/27/11 EF 30-35%    Past Surgical History:  Procedure Laterality Date  . ABDOMINAL HYSTERECTOMY    . BIV ICD INSERTION CRT-D N/A 12/13/2016   Procedure: BiV ICD Insertion CRT-D;  Surgeon: Deboraha Sprang, MD;  Location: [place] [facility];  Service: Cardiovascular;  Laterality: N/A;  . cardiac catherization  2004   [place], [place] - Dr  Lynnell Jude  . CARDIAC CATHETERIZATION    . CARDIAC CATHETERIZATION N/A 09/15/2015   Procedure: Right/Left Heart Cath and Coronary Angiography;  Surgeon: Jolaine Artist, MD;  Location: [place] [facility];  Service: Cardiovascular;  Laterality: N/A;  . COLONOSCOPY WITH PROPOFOL N/A 03/26/2015   Procedure: COLONOSCOPY WITH PROPOFOL;  Surgeon: Carol Ada, MD;  Location: [facility];  Service: Endoscopy;  Laterality: N/A;  . colonscopy    . CYSTOSCOPY  11/23/2011   Procedure: CYSTOSCOPY;  Surgeon: Jolayne Haines, MD;  Location: [facility];  Service: Gynecology;  Laterality: N/A;  . DIAGNOSTIC LAPAROSCOPY     of pelvis  . DILATION AND CURETTAGE OF UTERUS  12/2006,  10/2005   hysteroscopy surgery x 2  . ESOPHAGOGASTRODUODENOSCOPY (EGD) WITH PROPOFOL N/A 11/12/2020   Procedure: ESOPHAGOGASTRODUODENOSCOPY (EGD) WITH PROPOFOL;  Surgeon: Carol Ada, MD;  Location: [facility];  Service: Endoscopy;  Laterality: N/A;  . INSERTION OF ICD  12/13/2016   BIV  . LAPAROSCOPIC ASSISTED VAGINAL HYSTERECTOMY  11/23/2011   Procedure: LAPAROSCOPIC ASSISTED VAGINAL HYSTERECTOMY;  Surgeon: Jolayne Haines, MD;  Location: [facility];  Service: Gynecology;  Laterality: N/A;  . NOVASURE ABLATION  10/2005  . SALPINGOOPHORECTOMY  11/23/2011   Procedure: SALPINGO OOPHERECTOMY;  Surgeon: Delbert Harness, MD;  Location: [facility];  Service: Gynecology;  Laterality: Bilateral;  . SAVORY DILATION N/A 11/12/2020   Procedure: SAVORY DILATION;  Surgeon: Jeani Hawking, MD;  Location: [facility];  Service: Endoscopy;  Laterality: N/A;  . SVD     x 3  . TOOTH EXTRACTION N/A 04/05/2017   Procedure: DENTAL EXTRACTIONS OF TEETH FIVE, SEVEN, SIXTEEN, SEVENTEEN;  Surgeon: Ocie Doyne, DDS;  Location: [facility];  Service: Oral Surgery;  Laterality: N/A;  . TUBAL LIGATION    . WISDOM TOOTH EXTRACTION      There were no vitals filed for this visit.    Subjective Assessment - 12/02/20 1644    Subjective Pt reprorts she hurts from teh middle of her  bakc and it goes down then travels around the sides of the hips and down the lateral legs to the feet. She doesn't sleep well at night due to tossing and turning side to side because she can't lie on her back. She walks a lot for exercise 30 min/day, as recommended by her cardiologist due to her defribillator. Her "legs cramp and give a lot". "The other day I did a 2 mile walk and felt some after mile 1, but will walk as long as she can until it's unbearable. I try not to let anything keep me down"    Pertinent History has defibrillator    Limitations House hold activities;Lifting;Standing;Walking;Sitting    How long can you sit comfortably? hours    How long can you stand comfortably? 10-15 min    How long can you walk comfortably? 30 min    Diagnostic tests MRI Lumbar Spine Without Contrast:    IMPRESSION:  Progression of degenerative disease at L5-S1 where a large central  protrusion causes moderate to moderately severe central canal  stenosis and narrowing of both subarticular recesses with  encroachment on the S1 roots. There is also mild bilateral foraminal  narrowing at this level.     Shallow disc bulge and moderate facet arthropathy at L4-5 have  progressed somewhat since the prior exam. There is mild central  canal stenosis at L4-5.    Patient Stated Goals find a way to ease the pain to sleep better    Currently in Pain? Yes    Pain Score 0-No pain   10/10 worst   Pain Location Back    Pain Orientation Mid;Lower;Right;Left    Pain Descriptors / Indicators Burning;Dull;Aching    Pain Type Chronic pain    Pain Radiating Towards down lateral legs    Pain Onset More than a month ago    Pain Frequency Intermittent    Aggravating Factors  sleeping in any position, up > down steps    Pain Relieving Factors icy hot, ice to sleep              [facility] PT Assessment - 12/02/20 0001      Assessment   Medical Diagnosis M54.50,G89.29 (ICD-10-CM) - Chronic low back pain, unspecified back pain  laterality, unspecified whether sciatica present    Referring Provider (PT) Gust Rung, DO    Onset Date/Surgical Date --   years ago   Hand Dominance Right    Next MD Visit doesn't know when    Prior Therapy Yes - for shoulder a couple of years ago      Precautions   Precautions None      Restrictions  Weight Bearing Restrictions No      Balance Screen   Has the patient fallen in the past 6 months No    Has the patient had a decrease in activity level because of a fear of falling?  No    Is the patient reluctant to leave their home because of a fear of falling?  No      Home Environment   Living Environment Private residence    Living Arrangements Alone    Type of [place] to enter    Entrance Stairs-Number of Steps 3    Entrance Stairs-Rails Can reach both    [place] Two level;Able to live on main level with bedroom/bathroom;Other (Comment)   has to do laundry upstairs   Alternate Level Stairs-Number of Steps 15    Alternate Level Stairs-Rails Right      Prior Function   Level of Independence Independent    Vocation On disability    Leisure listen to music, write      Observation/Other Assessments   Focus on Therapeutic Outcomes (FOTO)  53% ability, 66% predicted      Functional Tests   Functional tests Single leg stance      Single Leg Stance   Comments R 6s, L 5s      Posture/Postural Control   Posture/Postural Control Postural limitations    Posture Comments increased thoracic kyphosis, forward shoulder rounding, forward head, anterior pelvic tilt      ROM / Strength   AROM / PROM / Strength AROM;Strength      AROM   AROM Assessment Site Lumbar    Lumbar Flexion WFL   some pain in low back   Lumbar Extension 15   pain in low back   Lumbar - Right Side Bend to patella    Lumbar - Left Side Bend to patella    Lumbar - Right Rotation 50% limited    Lumbar - Left Rotation 50% limited      Strength   Overall Strength  Comments overall 4+/5 to 5/5 in B LE      Flexibility   Soft Tissue Assessment /Muscle Length yes    Piriformis tighter on R LE, WFL L LE      Special Tests    Special Tests Lumbar    Lumbar Tests FABER test;Slump Test;Straight Leg Raise      FABER test   findings Negative      Slump test   Findings Positive    Comment Both      Straight Leg Raise   Findings Positive    Side  Right    Comment 51                      Objective measurements completed on examination: See above findings.               PT Education - 12/02/20 1911    Education Details Educated on diagnosis, prognosis, posture, importance of core activation and timing, anatomy of muscle crossbridges as related to pelvic alignment, FOTO, and HEP    Person(s) Educated Patient    Methods Explanation;Demonstration;Tactile cues;Verbal cues;Handout    Comprehension Verbalized understanding;Returned demonstration;Verbal cues required;Tactile cues required            PT Short Term Goals - 12/02/20 1849      PT SHORT TERM GOAL #1   Title Pt will be independent and compliant with initial HEP.  Baseline provided at eval    Time 3    Period Weeks    Status New    Target Date 12/23/20      PT SHORT TERM GOAL #2   Title Pt will perform posterior pelvic tilt with good form and breathing for improved pelvic alignment and decreased tension on low back from hip flexors.    Baseline intermittently performed correctly during HEP prescription    Time 3    Period Weeks    Status New    Target Date 12/23/20             PT Long Term Goals - 12/02/20 1853      PT LONG TERM GOAL #1   Title Pt will be independent with advanced HEP for maintenance.    Time 8    Period Weeks    Status New    Target Date 01/27/21      PT LONG TERM GOAL #2   Title Pt will increase SLS B to at least 15 seconds for improved stability.    Baseline 5 seconds L, 6 seconds R    Time 8    Period Weeks    Status  New    Target Date 01/27/21      PT LONG TERM GOAL #3   Title Pt will decrease pain level to </=4/10 at worst with rest and activity.    Baseline 10/10    Time 8    Period Weeks    Status New    Target Date 01/27/21      PT LONG TERM GOAL #4   Title Pt will be able to sleep >5 hours at a time without pain waking her up.    Baseline 4 hours    Time 8    Period Weeks    Status New    Target Date 01/27/21      PT LONG TERM GOAL #5   Title Pt will increase FOTO ability to at least 66% ability, in order to demonstrate meaningful change in perceived level of functional ability.    Baseline 53% ability    Time 8    Period Weeks    Status New    Target Date 01/27/21                  Plan - 12/02/20 1854    Clinical Impression Statement Pt is a 55 yo female who presents to OP PT with chronic low back pain and bilateral sciatica. Pt had MRI without contrast revealing degeneration between L5-S1, severe central canal stenosis, among other abnormalities. Pt is on disability and does not report much activity aside from 30 minutes of walking today, as recommended by her cardiologist due to her defibrillator. Pt demonstrates impairments in painful lumbar AROM, decreased tolerance to standing and supine lying affecting I/ADLs and sleep, core stabilization with difficulty performing pelvic tilt + transversus abdominis draw in, and SLS. Pt was educated on diagnosis, prognosis, posture, importance of core activation and timing, anatomy of muscle crossbridges as related to pelvic alignment, FOTO, and HEP. Pt verbalized understanding and consent to tx. Pt would benefit from skilled PT 1-2x/week for 8 weeks to address impairments and allow pt to sleep through the night.    Personal Factors and Comorbidities Time since onset of injury/illness/exacerbation;Fitness    Examination-Activity Limitations Stairs;Squat;Lift;Bend;Sleep;Locomotion Level;Transfers    Examination-Participation Restrictions  Cleaning;Laundry;Community Activity;Interpersonal Relationship    Stability/Clinical Decision Making Evolving/Moderate complexity    Clinical Decision Making Moderate  Rehab Potential Good    PT Frequency Other (comment)   1-2x/week   PT Duration 8 weeks    PT Treatment/Interventions ADLs/Self Care Home Management;Aquatic Therapy;Cryotherapy;Moist Heat;Spinal Manipulations;Joint Manipulations;Dry needling;Passive range of motion;Taping;Manual techniques;Patient/family education;Therapeutic exercise;Gait training;Stair training;Traction;Therapeutic activities;Functional mobility training;Balance training;Neuromuscular re-education    PT Next Visit Plan Assess response to HEP/update PRN, initiate core stab, flexibility, hip strength, balance, consider BERG    PT Home Exercise Plan [email or web address]    Consulted and Agree with Plan of Care Patient           Patient will benefit from skilled therapeutic intervention in order to improve the following deficits and impairments:  Decreased balance,Decreased endurance,Difficulty walking,Obesity,Pain,Postural dysfunction,Impaired flexibility,Decreased strength,Decreased activity tolerance,Impaired perceived functional ability,Improper body mechanics,Decreased range of motion  Visit Diagnosis: Chronic bilateral low back pain with bilateral sciatica - Plan: PT plan of care cert/re-cert  Muscle weakness (generalized) - Plan: PT plan of care cert/re-cert  Abnormal posture - Plan: PT plan of care cert/re-cert  Difficulty in walking, not elsewhere classified - Plan: PT plan of care cert/re-cert     Problem List Patient Active Problem List   Diagnosis Date Noted  . Dysphagia 10/06/2020  . ICD (implantable cardioverter-defibrillator) in place-MDT 04/25/2020  . Recent Coronavirus infection 09/01/2019  . Insomnia 07/02/2018  . Depression 07/02/2018  . Restless leg syndrome 03/12/2018  . Constipation 02/21/2018  . Asthma 06/12/2017  . Healthcare  maintenance 06/06/2017  . Right shoulder pain 06/05/2017  . Chronic low back pain 04/03/2017  . NICM (nonischemic cardiomyopathy) ([place]) 12/13/2016  . LBBB (left bundle branch block) 12/13/2016  . Chronic pansinusitis 09/20/2016  . Laryngopharyngeal reflux (LPR) 09/20/2016  . HLD (hyperlipidemia) 08/18/2016  . History of colonic polyps 03/08/2016  . Chronic hip pain 12/28/2015  . Diabetes mellitus type II, uncontrolled ([place]) 10/05/2015  . HTN (hypertension) 09/17/2014  . Goiter 07/10/2011  . Obstructive sleep apnea 05/29/2011  . Chronic systolic heart failure ([place]) 04/10/2011    Izell Nephi, PT, DPT 12/02/2020, 7:18 PM  [facility] [address] [place], [place], [address] Phone: [phone]   Fax:  [phone]  Name: LACRYSTAL BARBE MRN: [id number] Date of Birth: October 10, 1965

## 2020-12-06 ENCOUNTER — Other Ambulatory Visit (HOSPITAL_COMMUNITY): Payer: Self-pay | Admitting: Internal Medicine

## 2020-12-07 ENCOUNTER — Ambulatory Visit: Payer: Medicare Other | Admitting: Physical Therapy

## 2020-12-07 ENCOUNTER — Other Ambulatory Visit: Payer: Self-pay

## 2020-12-07 DIAGNOSIS — R293 Abnormal posture: Secondary | ICD-10-CM

## 2020-12-07 DIAGNOSIS — M6281 Muscle weakness (generalized): Secondary | ICD-10-CM

## 2020-12-07 DIAGNOSIS — G8929 Other chronic pain: Secondary | ICD-10-CM

## 2020-12-07 DIAGNOSIS — M5442 Lumbago with sciatica, left side: Secondary | ICD-10-CM | POA: Diagnosis not present

## 2020-12-07 DIAGNOSIS — R262 Difficulty in walking, not elsewhere classified: Secondary | ICD-10-CM

## 2020-12-07 NOTE — Therapy (Signed)
[place], [place], [address] Phone: [phone]   Fax:  [phone]  Physical Therapy Treatment  Patient Details  Name: Susan Peck MRN: [id number] Date of Birth: March 18, 1966 Referring Provider (PT): Lucious Groves, DO   Encounter Date: 12/07/2020   PT End of Session - 12/07/20 1413    Visit Number 2    Number of Visits 16    Date for PT Re-Evaluation 01/27/21    Authorization Type UHC Medicare    Authorization Time Period Foto Visit 6 and 10    Progress Note Due on Visit 10    PT Start Time 1330    PT Stop Time 1410    PT Time Calculation (min) 40 min    Activity Tolerance Patient tolerated treatment well    Behavior During Therapy [facility] for tasks assessed/performed           Past Medical History:  Diagnosis Date  . AICD (automatic cardioverter/defibrillator) present 12/13/2016  . Anxiety    no meds  . Asthma   . CHF (congestive heart failure) ([place])   . Depression    no meds  . Diabetes mellitus    recent dx 11/17/11 - started med 11/18/11 type 2  . GERD (gastroesophageal reflux disease)   . Goiter 07/27/2011   non-neoplastic goiter - fine needle aspiration - benign sees dr Dwyane Dee for  . Heart murmur    dx 2 yrs ago per pt  . Herpes genitalis in women   . Hypertension   . IBS (irritable bowel syndrome)    tx with diet per pt  . Low back pain    history  . Obesity   . Seasonal allergies   . Shortness of breath    occasional - exercise induced  . Sleep apnea    cpap broken  . Systolic heart failure    May 2011 EF 35-40%, 04/03/11 EF 25-30%, 06/27/11 EF 30-35%    Past Surgical History:  Procedure Laterality Date  . ABDOMINAL HYSTERECTOMY    . BIV ICD INSERTION CRT-D N/A 12/13/2016   Procedure: BiV ICD Insertion CRT-D;  Surgeon: Deboraha Sprang, MD;  Location: [place] [facility];  Service: Cardiovascular;  Laterality: N/A;  . cardiac catherization  2004   [place], [place] - Dr Lynnell Jude   . CARDIAC CATHETERIZATION    . CARDIAC CATHETERIZATION N/A 09/15/2015   Procedure: Right/Left Heart Cath and Coronary Angiography;  Surgeon: Jolaine Artist, MD;  Location: [place] [facility];  Service: Cardiovascular;  Laterality: N/A;  . COLONOSCOPY WITH PROPOFOL N/A 03/26/2015   Procedure: COLONOSCOPY WITH PROPOFOL;  Surgeon: Carol Ada, MD;  [facility];  Service: Endoscopy;  Laterality: N/A;  . colonscopy    . CYSTOSCOPY  11/23/2011   Procedure: CYSTOSCOPY;  Surgeon: Jolayne Haines, MD;  Location: [place];  Service: Gynecology;  Laterality: N/A;  . DIAGNOSTIC LAPAROSCOPY     of pelvis  . DILATION AND CURETTAGE OF UTERUS  12/2006,  10/2005   hysteroscopy surgery x 2  . ESOPHAGOGASTRODUODENOSCOPY (EGD) WITH PROPOFOL N/A 11/12/2020   Procedure: ESOPHAGOGASTRODUODENOSCOPY (EGD) WITH PROPOFOL;  Surgeon: Carol Ada, MD;  [facility];  Service: Endoscopy;  Laterality: N/A;  . INSERTION OF ICD  12/13/2016   BIV  . LAPAROSCOPIC ASSISTED VAGINAL HYSTERECTOMY  11/23/2011   Procedure: LAPAROSCOPIC ASSISTED VAGINAL HYSTERECTOMY;  Surgeon: Jolayne Haines, MD;  Location: [facility];  Service: Gynecology;  Laterality: N/A;  . NOVASURE ABLATION  10/2005  . SALPINGOOPHORECTOMY  11/23/2011   Procedure: SALPINGO OOPHERECTOMY;  Surgeon: Jolayne Haines, MD;  Location: [facility];  Service: Gynecology;  Laterality: Bilateral;  . SAVORY DILATION N/A 11/12/2020   Procedure: SAVORY DILATION;  Surgeon: Carol Ada, MD;  [facility];  Service: Endoscopy;  Laterality: N/A;  . SVD     x 3  . TOOTH EXTRACTION N/A 04/05/2017   Procedure: DENTAL EXTRACTIONS OF TEETH FIVE, SEVEN, SIXTEEN, SEVENTEEN;  Surgeon: Diona Browner, DDS;  Location: [facility];  Service: Oral Surgery;  Laterality: N/A;  . TUBAL LIGATION    . WISDOM TOOTH EXTRACTION      There were no vitals filed for this visit.   Subjective Assessment - 12/07/20 1330    Subjective "A little okay." Pt reports increased pain today --  believes that her bed is too hard and is causing issues. Pt notes that she's a side sleeper but it starts to hurt her leg. Pt states she has tried the exercises a little. Pt notes increased stress as well.    Pertinent History has defibrillator    Limitations House hold activities;Lifting;Standing;Walking;Sitting    How long can you sit comfortably? hours    How long can you stand comfortably? 10-15 min    How long can you walk comfortably? 30 min    Diagnostic tests MRI Lumbar Spine Without Contrast:    IMPRESSION:  Progression of degenerative disease at L5-S1 where a large central  protrusion causes moderate to moderately severe central canal  stenosis and narrowing of both subarticular recesses with  encroachment on the S1 roots. There is also mild bilateral foraminal  narrowing at this level.     Shallow disc bulge and moderate facet arthropathy at L4-5 have  progressed somewhat since the prior exam. There is mild central  canal stenosis at L4-5.    Patient Stated Goals find a way to ease the pain to sleep better    Currently in Pain? Yes    Pain Score 5     Pain Location Back    Pain Orientation Mid;Lower    Pain Onset More than a month ago                             [facility] Adult PT Treatment/Exercise - 12/07/20 0001      Exercises   Exercises Lumbar      Lumbar Exercises: Stretches   Piriformis Stretch Right;Left;20 seconds      Lumbar Exercises: Aerobic   Nustep L5 x 6      Lumbar Exercises: Supine   Pelvic Tilt 10 reps    Clam 20 reps    Clam Limitations green tband    Bridge 10 reps    Other Supine Lumbar Exercises March with PPT x10      Lumbar Exercises: Prone   Straight Leg Raise 10 reps      Lumbar Exercises: Quadruped   Straight Leg Raise 10 reps      Manual Therapy   Manual Therapy Soft tissue mobilization    Soft tissue mobilization TPR bilat QL and paraspinals; self massage and TPR with tennis ball lumbar & thoracic spine                   PT Education - 12/07/20 1414    Education Details Discussed bolsters or body pillow for her sleep positioning since pillows move too much for her    Person(s) Educated  Patient    Methods Explanation;Demonstration;Tactile cues;Verbal cues;Handout    Comprehension Verbalized understanding;Returned demonstration;Verbal cues required;Tactile cues required            PT Short Term Goals - 12/02/20 1849      PT SHORT TERM GOAL #1   Title Pt will be independent and compliant with initial HEP.    Baseline provided at eval    Time 3    Period Weeks    Status New    Target Date 12/23/20      PT SHORT TERM GOAL #2   Title Pt will perform posterior pelvic tilt with good form and breathing for improved pelvic alignment and decreased tension on low back from hip flexors.    Baseline intermittently performed correctly during HEP prescription    Time 3    Period Weeks    Status New    Target Date 12/23/20             PT Long Term Goals - 12/02/20 1853      PT LONG TERM GOAL #1   Title Pt will be independent with advanced HEP for maintenance.    Time 8    Period Weeks    Status New    Target Date 01/27/21      PT LONG TERM GOAL #2   Title Pt will increase SLS B to at least 15 seconds for improved stability.    Baseline 5 seconds L, 6 seconds R    Time 8    Period Weeks    Status New    Target Date 01/27/21      PT LONG TERM GOAL #3   Title Pt will decrease pain level to </=4/10 at worst with rest and activity.    Baseline 10/10    Time 8    Period Weeks    Status New    Target Date 01/27/21      PT LONG TERM GOAL #4   Title Pt will be able to sleep >5 hours at a time without pain waking her up.    Baseline 4 hours    Time 8    Period Weeks    Status New    Target Date 01/27/21      PT LONG TERM GOAL #5   Title Pt will increase FOTO ability to at least 66% ability, in order to demonstrate meaningful change in perceived level of functional ability.     Baseline 53% ability    Time 8    Period Weeks    Status New    Target Date 01/27/21                 Plan - 12/07/20 1411    Clinical Impression Statement Addressed pain and stiffness via manual therapy, stretching, and initiating core/hip strengthening this session. After manual, pt reports no pain. Introduced pelvic tilt with sitting and standing posture. Pt tolerated session well. Updated HEP accordingly. Discussed that stretches are as needed but pt should try to do at least a few stretches during the day.    Personal Factors and Comorbidities Time since onset of injury/illness/exacerbation;Fitness    Examination-Activity Limitations Stairs;Squat;Lift;Bend;Sleep;Locomotion Level;Transfers    Examination-Participation Restrictions Cleaning;Laundry;Community Activity;Interpersonal Relationship    Stability/Clinical Decision Making Evolving/Moderate complexity    Rehab Potential Good    PT Frequency Other (comment)   1-2x/week   PT Duration 8 weeks    PT Treatment/Interventions ADLs/Self Care Home Management;Aquatic Therapy;Cryotherapy;Moist Heat;Spinal Manipulations;Joint Manipulations;Dry needling;Passive range of motion;Taping;Manual  techniques;Patient/family education;Therapeutic exercise;Gait training;Stair training;Traction;Therapeutic activities;Functional mobility training;Balance training;Neuromuscular re-education    PT Next Visit Plan Assess response to HEP/update PRN, initiate core stab, flexibility, hip strength, balance/stability    PT Home Exercise Plan [email or web address]    Consulted and Agree with Plan of Care Patient           Patient will benefit from skilled therapeutic intervention in order to improve the following deficits and impairments:  Decreased balance,Decreased endurance,Difficulty walking,Obesity,Pain,Postural dysfunction,Impaired flexibility,Decreased strength,Decreased activity tolerance,Impaired perceived functional ability,Improper body mechanics,Decreased  range of motion  Visit Diagnosis: Chronic bilateral low back pain with bilateral sciatica  Muscle weakness (generalized)  Abnormal posture  Difficulty in walking, not elsewhere classified     Problem List Patient Active Problem List   Diagnosis Date Noted  . Dysphagia 10/06/2020  . ICD (implantable cardioverter-defibrillator) in place-MDT 04/25/2020  . Recent Coronavirus infection 09/01/2019  . Insomnia 07/02/2018  . Depression 07/02/2018  . Restless leg syndrome 03/12/2018  . Constipation 02/21/2018  . Asthma 06/12/2017  . Healthcare maintenance 06/06/2017  . Right shoulder pain 06/05/2017  . Chronic low back pain 04/03/2017  . NICM (nonischemic cardiomyopathy) ([place]) 12/13/2016  . LBBB (left bundle branch block) 12/13/2016  . Chronic pansinusitis 09/20/2016  . Laryngopharyngeal reflux (LPR) 09/20/2016  . HLD (hyperlipidemia) 08/18/2016  . History of colonic polyps 03/08/2016  . Chronic hip pain 12/28/2015  . Diabetes mellitus type II, uncontrolled ([place]) 10/05/2015  . HTN (hypertension) 09/17/2014  . Goiter 07/10/2011  . Obstructive sleep apnea 05/29/2011  . Chronic systolic heart failure [facility]) 04/10/2011    Eliot Bencivenga April Ma L Morgan PT, DPT 12/07/2020, 2:15 PM  [facility] [address] [place], [place], [address] Phone: [phone]   Fax:  [phone]  Name: ONICA DAVIDOVICH MRN: [id number] Date of Birth: [birth year]

## 2020-12-09 ENCOUNTER — Ambulatory Visit: Payer: Medicare Other | Admitting: Physical Therapy

## 2020-12-11 ENCOUNTER — Other Ambulatory Visit: Payer: Self-pay

## 2020-12-11 ENCOUNTER — Ambulatory Visit: Payer: Medicare Other

## 2020-12-11 DIAGNOSIS — G8929 Other chronic pain: Secondary | ICD-10-CM

## 2020-12-11 DIAGNOSIS — M5442 Lumbago with sciatica, left side: Secondary | ICD-10-CM | POA: Diagnosis not present

## 2020-12-11 DIAGNOSIS — R262 Difficulty in walking, not elsewhere classified: Secondary | ICD-10-CM

## 2020-12-11 DIAGNOSIS — M6281 Muscle weakness (generalized): Secondary | ICD-10-CM

## 2020-12-11 DIAGNOSIS — R293 Abnormal posture: Secondary | ICD-10-CM

## 2020-12-11 NOTE — Therapy (Signed)
[place], [place], [address] Phone: [phone]   Fax:  [phone]  Physical Therapy Treatment  Patient Details  Name: Susan Peck MRN: [id number] Date of Birth: [birth year] Referring Provider (PT): Lucious Groves, DO   Encounter Date: 12/11/2020   PT End of Session - 12/11/20 1028    Visit Number 3    Number of Visits 16    Date for PT Re-Evaluation 01/27/21    Authorization Type UHC Medicare    Authorization Time Period Foto Visit 6 and 10    Progress Note Due on Visit 10    PT Start Time 0954    PT Stop Time 1034    PT Time Calculation (min) 40 min    Activity Tolerance Patient tolerated treatment well    Behavior During Therapy [facility] for tasks assessed/performed           Past Medical History:  Diagnosis Date  . AICD (automatic cardioverter/defibrillator) present 12/13/2016  . Anxiety    no meds  . Asthma   . CHF (congestive heart failure) ([place])   . Depression    no meds  . Diabetes mellitus    recent dx 11/17/11 - started med 11/18/11 type 2  . GERD (gastroesophageal reflux disease)   . Goiter 07/27/2011   non-neoplastic goiter - fine needle aspiration - benign sees dr Dwyane Dee for  . Heart murmur    dx 2 yrs ago per pt  . Herpes genitalis in women   . Hypertension   . IBS (irritable bowel syndrome)    tx with diet per pt  . Low back pain    history  . Obesity   . Seasonal allergies   . Shortness of breath    occasional - exercise induced  . Sleep apnea    cpap broken  . Systolic heart failure    May 2011 EF 35-40%, 04/03/11 EF 25-30%, 06/27/11 EF 30-35%    Past Surgical History:  Procedure Laterality Date  . ABDOMINAL HYSTERECTOMY    . BIV ICD INSERTION CRT-D N/A 12/13/2016   Procedure: BiV ICD Insertion CRT-D;  Surgeon: Deboraha Sprang, MD;  Location: [place] [facility];  Service: Cardiovascular;  Laterality: N/A;  . cardiac catherization  2004   [place], [place] - Dr Lynnell Jude   . CARDIAC CATHETERIZATION    . CARDIAC CATHETERIZATION N/A 09/15/2015   Procedure: Right/Left Heart Cath and Coronary Angiography;  Surgeon: Jolaine Artist, MD;  Location: [place] [facility];  Service: Cardiovascular;  Laterality: N/A;  . COLONOSCOPY WITH PROPOFOL N/A 03/26/2015   Procedure: COLONOSCOPY WITH PROPOFOL;  Surgeon: Carol Ada, MD;  [facility];  Service: Endoscopy;  Laterality: N/A;  . colonscopy    . CYSTOSCOPY  11/23/2011   Procedure: CYSTOSCOPY;  Surgeon: Jolayne Haines, MD;  Location: [facility];  Service: Gynecology;  Laterality: N/A;  . DIAGNOSTIC LAPAROSCOPY     of pelvis  . DILATION AND CURETTAGE OF UTERUS  12/2006,  10/2005   hysteroscopy surgery x 2  . ESOPHAGOGASTRODUODENOSCOPY (EGD) WITH PROPOFOL N/A 11/12/2020   Procedure: ESOPHAGOGASTRODUODENOSCOPY (EGD) WITH PROPOFOL;  Surgeon: Carol Ada, MD;  [facility];  Service: Endoscopy;  Laterality: N/A;  . INSERTION OF ICD  12/13/2016   BIV  . LAPAROSCOPIC ASSISTED VAGINAL HYSTERECTOMY  11/23/2011   Procedure: LAPAROSCOPIC ASSISTED VAGINAL HYSTERECTOMY;  Surgeon: Jolayne Haines, MD;  Location: [facility];  Service: Gynecology;  Laterality: N/A;  . NOVASURE ABLATION  10/2005  . SALPINGOOPHORECTOMY  11/23/2011   Procedure: SALPINGO OOPHERECTOMY;  Surgeon: Delbert Harness, MD;  Location: [facility];  Service: Gynecology;  Laterality: Bilateral;  . SAVORY DILATION N/A 11/12/2020   Procedure: SAVORY DILATION;  Surgeon: Jeani Hawking, MD;  [facility];  Service: Endoscopy;  Laterality: N/A;  . SVD     x 3  . TOOTH EXTRACTION N/A 04/05/2017   Procedure: DENTAL EXTRACTIONS OF TEETH FIVE, SEVEN, SIXTEEN, SEVENTEEN;  Surgeon: Ocie Doyne, DDS;  [facility];  Service: Oral Surgery;  Laterality: N/A;  . TUBAL LIGATION    . WISDOM TOOTH EXTRACTION      There were no vitals filed for this visit.   Subjective Assessment - 12/11/20 1003    Subjective Pt reports she doing better. The tennis ball for  massage is helping. Pt reports no low back pain today.    Pertinent History has defibrillator    Limitations House hold activities;Lifting;Standing;Walking;Sitting    Diagnostic tests MRI Lumbar Spine Without Contrast:    IMPRESSION:  Progression of degenerative disease at L5-S1 where a large central  protrusion causes moderate to moderately severe central canal  stenosis and narrowing of both subarticular recesses with  encroachment on the S1 roots. There is also mild bilateral foraminal  narrowing at this level.     Shallow disc bulge and moderate facet arthropathy at L4-5 have  progressed somewhat since the prior exam. There is mild central  canal stenosis at L4-5.    Patient Stated Goals find a way to ease the pain to sleep better    Currently in Pain? No/denies    Pain Score 0-No pain    Pain Location Back    Pain Orientation Mid;Lower;Posterior    Pain Onset More than a month ago                             [facility] Adult PT Treatment/Exercise - 12/11/20 0001      Exercises   Exercises Lumbar      Lumbar Exercises: Stretches   Passive Hamstring Stretch Right;Left;2 reps;20 seconds    Piriformis Stretch Right;Left;20 seconds    Other Lumbar Stretch Exercise Trunk flexion c green Tball; forward and laterally L and R; 5 reps each      Lumbar Exercises: Supine   Pelvic Tilt 10 reps   3 sec hold   Clam 20 reps    Clam Limitations green tband    Bent Knee Raise 5 reps   10 sec   Bent Knee Raise Limitations ab bracing    Bridge 10 reps    Bridge Limitations c PPT prior to    Other Supine Lumbar Exercises hip add set c bal; 15x; 3 sec; ball squeeze                  PT Education - 12/11/20 1244    Education Details Continued discussion of positioning when sleeping including a pillow between knees when side lying. Updated HEP    Person(s) Educated Patient    Methods Explanation;Demonstration;Tactile cues;Verbal cues;Handout    Comprehension Verbalized  understanding;Returned demonstration;Verbal cues required;Tactile cues required            PT Short Term Goals - 12/02/20 1849      PT SHORT TERM GOAL #1   Title Pt will be independent and compliant with initial HEP.    Baseline provided at eval    Time 3  Period Weeks    Status New    Target Date 12/23/20      PT SHORT TERM GOAL #2   Title Pt will perform posterior pelvic tilt with good form and breathing for improved pelvic alignment and decreased tension on low back from hip flexors.    Baseline intermittently performed correctly during HEP prescription    Time 3    Period Weeks    Status New    Target Date 12/23/20             PT Long Term Goals - 12/02/20 1853      PT LONG TERM GOAL #1   Title Pt will be independent with advanced HEP for maintenance.    Time 8    Period Weeks    Status New    Target Date 01/27/21      PT LONG TERM GOAL #2   Title Pt will increase SLS B to at least 15 seconds for improved stability.    Baseline 5 seconds L, 6 seconds R    Time 8    Period Weeks    Status New    Target Date 01/27/21      PT LONG TERM GOAL #3   Title Pt will decrease pain level to </=4/10 at worst with rest and activity.    Baseline 10/10    Time 8    Period Weeks    Status New    Target Date 01/27/21      PT LONG TERM GOAL #4   Title Pt will be able to sleep >5 hours at a time without pain waking her up.    Baseline 4 hours    Time 8    Period Weeks    Status New    Target Date 01/27/21      PT LONG TERM GOAL #5   Title Pt will increase FOTO ability to at least 66% ability, in order to demonstrate meaningful change in perceived level of functional ability.    Baseline 53% ability    Time 8    Period Weeks    Status New    Target Date 01/27/21                 Plan - 12/11/20 1246    Clinical Impression Statement PT was completed for lumbopelvic mobility and strengthening with emphasis on core strengthening. Pt tolerated the session  well. Overall, pt is experiencing improved low back pain with the therex/HEP, pain management tools, and education provided. Pt will continue to benefit from skilled PT to decrease pain and optimize functional abilities.    Personal Factors and Comorbidities Time since onset of injury/illness/exacerbation;Fitness    Examination-Activity Limitations Stairs;Squat;Lift;Bend;Sleep;Locomotion Level;Transfers    Examination-Participation Restrictions Cleaning;Laundry;Community Activity;Interpersonal Relationship    Stability/Clinical Decision Making Evolving/Moderate complexity    Clinical Decision Making Moderate    Rehab Potential Good    PT Frequency Other (comment)   1-2x/week   PT Duration 8 weeks    PT Treatment/Interventions ADLs/Self Care Home Management;Aquatic Therapy;Cryotherapy;Moist Heat;Spinal Manipulations;Joint Manipulations;Dry needling;Passive range of motion;Taping;Manual techniques;Patient/family education;Therapeutic exercise;Gait training;Stair training;Traction;Therapeutic activities;Functional mobility training;Balance training;Neuromuscular re-education    PT Next Visit Plan Pt requested exs to help with her leg strength,Assess response to HEP/update PRN, initiate core stab, flexibility, hip strength, balance/stability    PT Home Exercise Plan [email or web address]    Consulted and Agree with Plan of Care Patient           Patient will benefit from skilled therapeutic  intervention in order to improve the following deficits and impairments:  Decreased balance,Decreased endurance,Difficulty walking,Obesity,Pain,Postural dysfunction,Impaired flexibility,Decreased strength,Decreased activity tolerance,Impaired perceived functional ability,Improper body mechanics,Decreased range of motion  Visit Diagnosis: Chronic bilateral low back pain with bilateral sciatica  Muscle weakness (generalized)  Abnormal posture  Difficulty in walking, not elsewhere classified     Problem List Patient  Active Problem List   Diagnosis Date Noted  . Dysphagia 10/06/2020  . ICD (implantable cardioverter-defibrillator) in place-MDT 04/25/2020  . Recent Coronavirus infection 09/01/2019  . Insomnia 07/02/2018  . Depression 07/02/2018  . Restless leg syndrome 03/12/2018  . Constipation 02/21/2018  . Asthma 06/12/2017  . Healthcare maintenance 06/06/2017  . Right shoulder pain 06/05/2017  . Chronic low back pain 04/03/2017  . NICM (nonischemic cardiomyopathy) ([place]) 12/13/2016  . LBBB (left bundle branch block) 12/13/2016  . Chronic pansinusitis 09/20/2016  . Laryngopharyngeal reflux (LPR) 09/20/2016  . HLD (hyperlipidemia) 08/18/2016  . History of colonic polyps 03/08/2016  . Chronic hip pain 12/28/2015  . Diabetes mellitus type II, uncontrolled ([place]) 10/05/2015  . HTN (hypertension) 09/17/2014  . Goiter 07/10/2011  . Obstructive sleep apnea 05/29/2011  . Chronic systolic heart failure ([place]) 04/10/2011    Gar Ponto MS, PT 12/11/20 1:14 PM  [place] [facility] [address] [place], [place], [address] Phone: [phone]   Fax:  [phone]  Name: Susan Peck MRN: [id number] Date of Birth: [birth year]

## 2020-12-12 ENCOUNTER — Other Ambulatory Visit: Payer: Self-pay | Admitting: Student

## 2020-12-16 ENCOUNTER — Other Ambulatory Visit: Payer: Self-pay

## 2020-12-16 ENCOUNTER — Ambulatory Visit: Payer: Medicare Other

## 2020-12-16 DIAGNOSIS — M6281 Muscle weakness (generalized): Secondary | ICD-10-CM

## 2020-12-16 DIAGNOSIS — G8929 Other chronic pain: Secondary | ICD-10-CM

## 2020-12-16 DIAGNOSIS — M5442 Lumbago with sciatica, left side: Secondary | ICD-10-CM | POA: Diagnosis not present

## 2020-12-16 DIAGNOSIS — R262 Difficulty in walking, not elsewhere classified: Secondary | ICD-10-CM

## 2020-12-16 DIAGNOSIS — R293 Abnormal posture: Secondary | ICD-10-CM

## 2020-12-16 NOTE — Therapy (Signed)
[place], [place], [address] Phone: [phone]   Fax:  [phone]  Physical Therapy Treatment  Patient Details  Name: Susan Peck MRN: [id number] Date of Birth: [birth year] Referring Provider (PT): Lucious Groves, DO   Encounter Date: 12/16/2020   PT End of Session - 12/16/20 1834    Visit Number 4    Number of Visits 16    Date for PT Re-Evaluation 01/27/21    Authorization Type UHC Medicare    Authorization Time Period Foto Visit 6 and 10    Progress Note Due on Visit 10    PT Start Time 1761    PT Stop Time 1913    PT Time Calculation (min) 40 min    Activity Tolerance Patient tolerated treatment well    Behavior During Therapy [facility] for tasks assessed/performed           Past Medical History:  Diagnosis Date  . AICD (automatic cardioverter/defibrillator) present 12/13/2016  . Anxiety    no meds  . Asthma   . CHF (congestive heart failure) ([place])   . Depression    no meds  . Diabetes mellitus    recent dx 11/17/11 - started med 11/18/11 type 2  . GERD (gastroesophageal reflux disease)   . Goiter 07/27/2011   non-neoplastic goiter - fine needle aspiration - benign sees dr Dwyane Dee for  . Heart murmur    dx 2 yrs ago per pt  . Herpes genitalis in women   . Hypertension   . IBS (irritable bowel syndrome)    tx with diet per pt  . Low back pain    history  . Obesity   . Seasonal allergies   . Shortness of breath    occasional - exercise induced  . Sleep apnea    cpap broken  . Systolic heart failure    May 2011 EF 35-40%, 04/03/11 EF 25-30%, 06/27/11 EF 30-35%    Past Surgical History:  Procedure Laterality Date  . ABDOMINAL HYSTERECTOMY    . BIV ICD INSERTION CRT-D N/A 12/13/2016   Procedure: BiV ICD Insertion CRT-D;  Surgeon: Deboraha Sprang, MD;  Location: [place] [facility];  Service: Cardiovascular;  Laterality: N/A;  . cardiac catherization  2004   [place], [place] - Dr Lynnell Jude   . CARDIAC CATHETERIZATION    . CARDIAC CATHETERIZATION N/A 09/15/2015   Procedure: Right/Left Heart Cath and Coronary Angiography;  Surgeon: Jolaine Artist, MD;  Location: [place] [facility];  Service: Cardiovascular;  Laterality: N/A;  . COLONOSCOPY WITH PROPOFOL N/A 03/26/2015   Procedure: COLONOSCOPY WITH PROPOFOL;  Surgeon: Carol Ada, MD;  [facility];  Service: Endoscopy;  Laterality: N/A;  . colonscopy    . CYSTOSCOPY  11/23/2011   Procedure: CYSTOSCOPY;  Surgeon: Jolayne Haines, MD;  Location: [place] [facility];  Service: Gynecology;  Laterality: N/A;  . DIAGNOSTIC LAPAROSCOPY     of pelvis  . DILATION AND CURETTAGE OF UTERUS  12/2006,  10/2005   hysteroscopy surgery x 2  . ESOPHAGOGASTRODUODENOSCOPY (EGD) WITH PROPOFOL N/A 11/12/2020   Procedure: ESOPHAGOGASTRODUODENOSCOPY (EGD) WITH PROPOFOL;  Surgeon: Carol Ada, MD;  [facility];  Service: Endoscopy;  Laterality: N/A;  . INSERTION OF ICD  12/13/2016   BIV  . LAPAROSCOPIC ASSISTED VAGINAL HYSTERECTOMY  11/23/2011   Procedure: LAPAROSCOPIC ASSISTED VAGINAL HYSTERECTOMY;  Surgeon: Jolayne Haines, MD;  Location: [facility];  Service: Gynecology;  Laterality: N/A;  . NOVASURE ABLATION  10/2005  . SALPINGOOPHORECTOMY  11/23/2011   Procedure: SALPINGO OOPHERECTOMY;  Surgeon: Jolayne Haines, MD;  Location: [facility];  Service: Gynecology;  Laterality: Bilateral;  . SAVORY DILATION N/A 11/12/2020   Procedure: SAVORY DILATION;  Surgeon: Carol Ada, MD;  [facility];  Service: Endoscopy;  Laterality: N/A;  . SVD     x 3  . TOOTH EXTRACTION N/A 04/05/2017   Procedure: DENTAL EXTRACTIONS OF TEETH FIVE, SEVEN, SIXTEEN, SEVENTEEN;  Surgeon: Diona Browner, DDS;  Location: [place] [place];  Service: Oral Surgery;  Laterality: N/A;  . TUBAL LIGATION    . WISDOM TOOTH EXTRACTION      There were no vitals filed for this visit.   Subjective Assessment - 12/16/20 1834    Subjective Pt presents to PT with reports of increased LBP. She  has been compliant with her HEP with no adverse effect. Pt is ready to begin PT treatment at this time.    Currently in Pain? Yes    Pain Score 6     Pain Location Back                             [facility] Adult PT Treatment/Exercise - 12/16/20 0001      Lumbar Exercises: Stretches   Other Lumbar Stretch Exercise supine DKTC with red physioball 2x10      Lumbar Exercises: Aerobic   Nustep lvl 5 x 5 min while taking subjective UE/LE      Lumbar Exercises: Machines for Strengthening   Cybex Knee Flexion 2x10 25lbs    Leg Press 2x10 35lbs      Lumbar Exercises: Seated   Sit to Stand 10 reps   x 2     Lumbar Exercises: Supine   Pelvic Tilt 10 reps;5 seconds    Pelvic Tilt Limitations supine PPT w/ ball squeeze 2x10 - 5 sec hold    Bridge 10 reps   x 2   Other Supine Lumbar Exercises March with PPT x10                    PT Short Term Goals - 12/02/20 1849      PT SHORT TERM GOAL #1   Title Pt will be independent and compliant with initial HEP.    Baseline provided at eval    Time 3    Period Weeks    Status New    Target Date 12/23/20      PT SHORT TERM GOAL #2   Title Pt will perform posterior pelvic tilt with good form and breathing for improved pelvic alignment and decreased tension on low back from hip flexors.    Baseline intermittently performed correctly during HEP prescription    Time 3    Period Weeks    Status New    Target Date 12/23/20             PT Long Term Goals - 12/02/20 1853      PT LONG TERM GOAL #1   Title Pt will be independent with advanced HEP for maintenance.    Time 8    Period Weeks    Status New    Target Date 01/27/21      PT LONG TERM GOAL #2   Title Pt will increase SLS B to at least 15 seconds for improved stability.    Baseline 5 seconds L, 6 seconds R    Time 8    Period  Weeks    Status New    Target Date 01/27/21      PT LONG TERM GOAL #3   Title Pt will decrease pain level to </=4/10 at  worst with rest and activity.    Baseline 10/10    Time 8    Period Weeks    Status New    Target Date 01/27/21      PT LONG TERM GOAL #4   Title Pt will be able to sleep >5 hours at a time without pain waking her up.    Baseline 4 hours    Time 8    Period Weeks    Status New    Target Date 01/27/21      PT LONG TERM GOAL #5   Title Pt will increase FOTO ability to at least 66% ability, in order to demonstrate meaningful change in perceived level of functional ability.    Baseline 53% ability    Time 8    Period Weeks    Status New    Target Date 01/27/21                 Plan - 12/16/20 1900    Clinical Impression Statement Pt was able to complete prescribed exercises with no adverse effect or increase in pain. She noted decreased pain in lumbar spine post session to 4/10. Pt continued to show preference for neutral spine strengthening and lumbar flexion. She continues to benefit from skilled PT services and should continue to be seen and progressed as able.    Personal Factors and Comorbidities Time since onset of injury/illness/exacerbation;Fitness    Examination-Activity Limitations Stairs;Squat;Lift;Bend;Sleep;Locomotion Level;Transfers    Examination-Participation Restrictions Cleaning;Laundry;Community Activity;Interpersonal Relationship    Stability/Clinical Decision Making Evolving/Moderate complexity    Rehab Potential Good    PT Frequency Other (comment)   1-2x/week   PT Duration 8 weeks    PT Treatment/Interventions ADLs/Self Care Home Management;Aquatic Therapy;Cryotherapy;Moist Heat;Spinal Manipulations;Joint Manipulations;Dry needling;Passive range of motion;Taping;Manual techniques;Patient/family education;Therapeutic exercise;Gait training;Stair training;Traction;Therapeutic activities;Functional mobility training;Balance training;Neuromuscular re-education    PT Next Visit Plan continue to progress neutral spine core strengthening as well as LE posterior  chain    PT Home Exercise Plan [email or web address]    Consulted and Agree with Plan of Care Patient           Patient will benefit from skilled therapeutic intervention in order to improve the following deficits and impairments:  Decreased balance,Decreased endurance,Difficulty walking,Obesity,Pain,Postural dysfunction,Impaired flexibility,Decreased strength,Decreased activity tolerance,Impaired perceived functional ability,Improper body mechanics,Decreased range of motion  Visit Diagnosis: Chronic bilateral low back pain with bilateral sciatica  Muscle weakness (generalized)  Abnormal posture  Difficulty in walking, not elsewhere classified     Problem List Patient Active Problem List   Diagnosis Date Noted  . Dysphagia 10/06/2020  . ICD (implantable cardioverter-defibrillator) in place-MDT 04/25/2020  . Recent Coronavirus infection 09/01/2019  . Insomnia 07/02/2018  . Depression 07/02/2018  . Restless leg syndrome 03/12/2018  . Constipation 02/21/2018  . Asthma 06/12/2017  . Healthcare maintenance 06/06/2017  . Right shoulder pain 06/05/2017  . Chronic low back pain 04/03/2017  . NICM (nonischemic cardiomyopathy) ([place]) 12/13/2016  . LBBB (left bundle branch block) 12/13/2016  . Chronic pansinusitis 09/20/2016  . Laryngopharyngeal reflux (LPR) 09/20/2016  . HLD (hyperlipidemia) 08/18/2016  . History of colonic polyps 03/08/2016  . Chronic hip pain 12/28/2015  . Diabetes mellitus type II, uncontrolled ([place]) 10/05/2015  . HTN (hypertension) 09/17/2014  . Goiter 07/10/2011  . Obstructive sleep apnea 05/29/2011  .  Chronic systolic heart failure ([place]) 04/10/2011    Ward Chatters, PT, DPT 12/16/20 7:17 PM  [facility] [address] [place], [place], [address] Phone: [phone]   Fax:  [phone]  Name: Susan Peck MRN: [id number] Date of Birth: May 05, 1966

## 2020-12-21 ENCOUNTER — Ambulatory Visit: Payer: Medicare Other

## 2020-12-23 ENCOUNTER — Ambulatory Visit: Payer: Medicare Other | Attending: Internal Medicine

## 2020-12-23 ENCOUNTER — Other Ambulatory Visit: Payer: Self-pay

## 2020-12-23 DIAGNOSIS — M5442 Lumbago with sciatica, left side: Secondary | ICD-10-CM | POA: Diagnosis present

## 2020-12-23 DIAGNOSIS — M6281 Muscle weakness (generalized): Secondary | ICD-10-CM | POA: Diagnosis present

## 2020-12-23 DIAGNOSIS — R293 Abnormal posture: Secondary | ICD-10-CM | POA: Insufficient documentation

## 2020-12-23 DIAGNOSIS — G8929 Other chronic pain: Secondary | ICD-10-CM | POA: Diagnosis present

## 2020-12-23 DIAGNOSIS — R262 Difficulty in walking, not elsewhere classified: Secondary | ICD-10-CM | POA: Diagnosis present

## 2020-12-23 DIAGNOSIS — M5441 Lumbago with sciatica, right side: Secondary | ICD-10-CM | POA: Diagnosis present

## 2020-12-23 NOTE — Therapy (Signed)
[name], [place], [address] Phone: [phone]   Fax:  [phone]  Physical Therapy Treatment  Patient Details  Name: Susan Peck MRN: [id number] Date of Birth: 1966/06/29 Referring Provider (PT): Lucious Groves, DO   Encounter Date: 12/23/2020   PT End of Session - 12/23/20 1536    Visit Number 5    Number of Visits 16    Date for PT Re-Evaluation 01/27/21    Authorization Type UHC Medicare    Authorization Time Period Foto Visit 6 and 10    Progress Note Due on Visit 10    PT Start Time 1531    PT Stop Time 1612    PT Time Calculation (min) 41 min    Activity Tolerance Patient tolerated treatment well    Behavior During Therapy [facility] for tasks assessed/performed           Past Medical History:  Diagnosis Date  . AICD (automatic cardioverter/defibrillator) present 12/13/2016  . Anxiety    no meds  . Asthma   . CHF (congestive heart failure) ([place])   . Depression    no meds  . Diabetes mellitus    recent dx 11/17/11 - started med 11/18/11 type 2  . GERD (gastroesophageal reflux disease)   . Goiter 07/27/2011   non-neoplastic goiter - fine needle aspiration - benign sees dr Dwyane Dee for  . Heart murmur    dx 2 yrs ago per pt  . Herpes genitalis in women   . Hypertension   . IBS (irritable bowel syndrome)    tx with diet per pt  . Low back pain    history  . Obesity   . Seasonal allergies   . Shortness of breath    occasional - exercise induced  . Sleep apnea    cpap broken  . Systolic heart failure    May 2011 EF 35-40%, 04/03/11 EF 25-30%, 06/27/11 EF 30-35%    Past Surgical History:  Procedure Laterality Date  . ABDOMINAL HYSTERECTOMY    . BIV ICD INSERTION CRT-D N/A 12/13/2016   Procedure: BiV ICD Insertion CRT-D;  Surgeon: Deboraha Sprang, MD;  Location: [place] [facility];  Service: Cardiovascular;  Laterality: N/A;  . cardiac catherization  2004   [place], [place] - Dr Lynnell Jude   . CARDIAC CATHETERIZATION    . CARDIAC CATHETERIZATION N/A 09/15/2015   Procedure: Right/Left Heart Cath and Coronary Angiography;  Surgeon: Jolaine Artist, MD;  Location: [place] [facility];  Service: Cardiovascular;  Laterality: N/A;  . COLONOSCOPY WITH PROPOFOL N/A 03/26/2015   Procedure: COLONOSCOPY WITH PROPOFOL;  Surgeon: Carol Ada, MD;  [facility];  Service: Endoscopy;  Laterality: N/A;  . colonscopy    . CYSTOSCOPY  11/23/2011   Procedure: CYSTOSCOPY;  Surgeon: Jolayne Haines, MD;  Location: [facility];  Service: Gynecology;  Laterality: N/A;  . DIAGNOSTIC LAPAROSCOPY     of pelvis  . DILATION AND CURETTAGE OF UTERUS  12/2006,  10/2005   hysteroscopy surgery x 2  . ESOPHAGOGASTRODUODENOSCOPY (EGD) WITH PROPOFOL N/A 11/12/2020   Procedure: ESOPHAGOGASTRODUODENOSCOPY (EGD) WITH PROPOFOL;  Surgeon: Carol Ada, MD;  [facility];  Service: Endoscopy;  Laterality: N/A;  . INSERTION OF ICD  12/13/2016   BIV  . LAPAROSCOPIC ASSISTED VAGINAL HYSTERECTOMY  11/23/2011   Procedure: LAPAROSCOPIC ASSISTED VAGINAL HYSTERECTOMY;  Surgeon: Jolayne Haines, MD;  Location: [facility];  Service: Gynecology;  Laterality: N/A;  . NOVASURE ABLATION  10/2005  . SALPINGOOPHORECTOMY  11/23/2011   Procedure: SALPINGO OOPHERECTOMY;  Surgeon: Jolayne Haines, MD;  Location: [facility];  Service: Gynecology;  Laterality: Bilateral;  . SAVORY DILATION N/A 11/12/2020   Procedure: SAVORY DILATION;  Surgeon: Carol Ada, MD;  [facility];  Service: Endoscopy;  Laterality: N/A;  . SVD     x 3  . TOOTH EXTRACTION N/A 04/05/2017   Procedure: DENTAL EXTRACTIONS OF TEETH FIVE, SEVEN, SIXTEEN, SEVENTEEN;  Surgeon: Diona Browner, DDS;  Location: [place];  Service: Oral Surgery;  Laterality: N/A;  . TUBAL LIGATION    . WISDOM TOOTH EXTRACTION      There were no vitals filed for this visit.   Subjective Assessment - 12/23/20 1536    Subjective Pt presents to PT with reports of LBP and also notes  she is stressed as she broke her phone at the grocery store earlier. She has been compliant with her HEP with no adverse effect. Pt is ready to begin PT treatment at this time.    Currently in Pain? Yes    Pain Score 7     Pain Location Back    Pain Orientation Lower                             [facility] Adult PT Treatment/Exercise - 12/23/20 0001      Lumbar Exercises: Stretches   Other Lumbar Stretch Exercise seated ball rollouts x 10 fwd/lateral green physioball      Lumbar Exercises: Seated   Sit to Stand 10 reps   x 2     Lumbar Exercises: Supine   Pelvic Tilt 10 reps;5 seconds    Pelvic Tilt Limitations supine PPT w/ ball squeeze 2x10 - 5 sec hold    Clam 20 reps   x 2   Clam Limitations blue tband    Bridge 10 reps   x2   Straight Leg Raise 10 reps   x 2 ea                   PT Short Term Goals - 12/02/20 1849      PT SHORT TERM GOAL #1   Title Pt will be independent and compliant with initial HEP.    Baseline provided at eval    Time 3    Period Weeks    Status New    Target Date 12/23/20      PT SHORT TERM GOAL #2   Title Pt will perform posterior pelvic tilt with good form and breathing for improved pelvic alignment and decreased tension on low back from hip flexors.    Baseline intermittently performed correctly during HEP prescription    Time 3    Period Weeks    Status New    Target Date 12/23/20             PT Long Term Goals - 12/02/20 1853      PT LONG TERM GOAL #1   Title Pt will be independent with advanced HEP for maintenance.    Time 8    Period Weeks    Status New    Target Date 01/27/21      PT LONG TERM GOAL #2   Title Pt will increase SLS B to at least 15 seconds for improved stability.    Baseline 5 seconds L, 6 seconds R    Time 8    Period Weeks    Status  New    Target Date 01/27/21      PT LONG TERM GOAL #3   Title Pt will decrease pain level to </=4/10 at worst with rest and activity.    Baseline  10/10    Time 8    Period Weeks    Status New    Target Date 01/27/21      PT LONG TERM GOAL #4   Title Pt will be able to sleep >5 hours at a time without pain waking her up.    Baseline 4 hours    Time 8    Period Weeks    Status New    Target Date 01/27/21      PT LONG TERM GOAL #5   Title Pt will increase FOTO ability to at least 66% ability, in order to demonstrate meaningful change in perceived level of functional ability.    Baseline 53% ability    Time 8    Period Weeks    Status New    Target Date 01/27/21                 Plan - 12/23/20 1602    Clinical Impression Statement Pt was once again able to complete prescribed exercises with no adverse effect. She noted decreased in pain post session. Pt continues to progress well with therapy, showing improving functional activity tolerance and decrease in pain. PT should continue to focus on increasing neutral spine core strengthening and proximal hip strength in order to improve functional ability and decrease pain.    Personal Factors and Comorbidities Time since onset of injury/illness/exacerbation;Fitness    Examination-Activity Limitations Stairs;Squat;Lift;Bend;Sleep;Locomotion Level;Transfers    Examination-Participation Restrictions Cleaning;Laundry;Community Activity;Interpersonal Relationship    Stability/Clinical Decision Making Evolving/Moderate complexity    PT Frequency --   1-2x/week   PT Treatment/Interventions ADLs/Self Care Home Management;Aquatic Therapy;Cryotherapy;Moist Heat;Spinal Manipulations;Joint Manipulations;Dry needling;Passive range of motion;Taping;Manual techniques;Patient/family education;Therapeutic exercise;Gait training;Stair training;Traction;Therapeutic activities;Functional mobility training;Balance training;Neuromuscular re-education    PT Next Visit Plan continue to progress neutral spine core strengthening as well as LE posterior chain    PT Home Exercise Plan [email or web address]     Consulted and Agree with Plan of Care Patient           Patient will benefit from skilled therapeutic intervention in order to improve the following deficits and impairments:  Decreased balance,Decreased endurance,Difficulty walking,Obesity,Pain,Postural dysfunction,Impaired flexibility,Decreased strength,Decreased activity tolerance,Impaired perceived functional ability,Improper body mechanics,Decreased range of motion  Visit Diagnosis: Chronic bilateral low back pain with bilateral sciatica  Muscle weakness (generalized)  Abnormal posture  Difficulty in walking, not elsewhere classified     Problem List Patient Active Problem List   Diagnosis Date Noted  . Dysphagia 10/06/2020  . ICD (implantable cardioverter-defibrillator) in place-MDT 04/25/2020  . Recent Coronavirus infection 09/01/2019  . Insomnia 07/02/2018  . Depression 07/02/2018  . Restless leg syndrome 03/12/2018  . Constipation 02/21/2018  . Asthma 06/12/2017  . Healthcare maintenance 06/06/2017  . Right shoulder pain 06/05/2017  . Chronic low back pain 04/03/2017  . NICM (nonischemic cardiomyopathy) ([place]) 12/13/2016  . LBBB (left bundle branch block) 12/13/2016  . Chronic pansinusitis 09/20/2016  . Laryngopharyngeal reflux (LPR) 09/20/2016  . HLD (hyperlipidemia) 08/18/2016  . History of colonic polyps 03/08/2016  . Chronic hip pain 12/28/2015  . Diabetes mellitus type II, uncontrolled ([place]) 10/05/2015  . HTN (hypertension) 09/17/2014  . Goiter 07/10/2011  . Obstructive sleep apnea 05/29/2011  . Chronic systolic heart failure ([place]) 04/10/2011    Ward Chatters, PT, DPT  12/23/20 4:13 PM  [place], [place], [address] Phone: [phone]   Fax:  [phone]  Name: HENNY STRAUCH MRN: [id number] Date of Birth: Apr 18, 1966

## 2020-12-24 ENCOUNTER — Encounter: Payer: Medicare Other | Admitting: Physical Therapy

## 2020-12-29 ENCOUNTER — Ambulatory Visit: Payer: Medicare Other

## 2020-12-29 ENCOUNTER — Other Ambulatory Visit: Payer: Self-pay

## 2020-12-29 DIAGNOSIS — R293 Abnormal posture: Secondary | ICD-10-CM

## 2020-12-29 DIAGNOSIS — M6281 Muscle weakness (generalized): Secondary | ICD-10-CM

## 2020-12-29 DIAGNOSIS — R262 Difficulty in walking, not elsewhere classified: Secondary | ICD-10-CM

## 2020-12-29 DIAGNOSIS — G8929 Other chronic pain: Secondary | ICD-10-CM

## 2020-12-29 DIAGNOSIS — M5442 Lumbago with sciatica, left side: Secondary | ICD-10-CM | POA: Diagnosis not present

## 2020-12-29 NOTE — Therapy (Signed)
[place], [place], [address] Phone: [phone]   Fax:  [phone]  Physical Therapy Treatment  Patient Details  Name: Susan Peck MRN: [id number] Date of Birth: February 10, 1966 Referring Provider (Susan Peck): Lucious Groves, DO   Encounter Date: 12/29/2020   Susan Peck End of Session - 12/29/20 1825    Visit Number 6    Number of Visits 16    Date for Susan Peck Re-Evaluation 01/27/21    Authorization Type UHC Medicare    Authorization Time Period Foto Visit 6 and 10    Progress Note Due on Visit 10    Susan Peck Start Time 1750    Susan Peck Stop Time 1830    Susan Peck Time Calculation (min) 40 min    Activity Tolerance Patient tolerated treatment well    Behavior During Therapy [facility] for tasks assessed/performed           Past Medical History:  Diagnosis Date  . AICD (automatic cardioverter/defibrillator) present 12/13/2016  . Anxiety    no meds  . Asthma   . CHF (congestive heart failure) ([place])   . Depression    no meds  . Diabetes mellitus    recent dx 11/17/11 - started med 11/18/11 type 2  . GERD (gastroesophageal reflux disease)   . Goiter 07/27/2011   non-neoplastic goiter - fine needle aspiration - benign sees dr Dwyane Dee for  . Heart murmur    dx 2 yrs ago per Susan Peck  . Herpes genitalis in women   . Hypertension   . IBS (irritable bowel syndrome)    tx with diet per Susan Peck  . Low back pain    history  . Obesity   . Seasonal allergies   . Shortness of breath    occasional - exercise induced  . Sleep apnea    cpap broken  . Systolic heart failure    May 2011 EF 35-40%, 04/03/11 EF 25-30%, 06/27/11 EF 30-35%    Past Surgical History:  Procedure Laterality Date  . ABDOMINAL HYSTERECTOMY    . BIV ICD INSERTION CRT-D N/A 12/13/2016   Procedure: BiV ICD Insertion CRT-D;  Surgeon: Deboraha Sprang, MD;  Location: [facility];  Service: Cardiovascular;  Laterality: N/A;  . cardiac catherization  2004   [place], [place] - Dr Lynnell Jude   . CARDIAC CATHETERIZATION    . CARDIAC CATHETERIZATION N/A 09/15/2015   Procedure: Right/Left Heart Cath and Coronary Angiography;  Surgeon: Jolaine Artist, MD;  Location: [facility];  Service: Cardiovascular;  Laterality: N/A;  . COLONOSCOPY WITH PROPOFOL N/A 03/26/2015   Procedure: COLONOSCOPY WITH PROPOFOL;  Surgeon: Carol Ada, MD;  [facility];  Service: Endoscopy;  Laterality: N/A;  . colonscopy    . CYSTOSCOPY  11/23/2011   Procedure: CYSTOSCOPY;  Surgeon: Jolayne Haines, MD;  Location: [facility];  Service: Gynecology;  Laterality: N/A;  . DIAGNOSTIC LAPAROSCOPY     of pelvis  . DILATION AND CURETTAGE OF UTERUS  12/2006,  10/2005   hysteroscopy surgery x 2  . ESOPHAGOGASTRODUODENOSCOPY (EGD) WITH PROPOFOL N/A 11/12/2020   Procedure: ESOPHAGOGASTRODUODENOSCOPY (EGD) WITH PROPOFOL;  Surgeon: Carol Ada, MD;  [facility];  Service: Endoscopy;  Laterality: N/A;  . INSERTION OF ICD  12/13/2016   BIV  . LAPAROSCOPIC ASSISTED VAGINAL HYSTERECTOMY  11/23/2011   Procedure: LAPAROSCOPIC ASSISTED VAGINAL HYSTERECTOMY;  Surgeon: Jolayne Haines, MD;  Location: [facility];  Service: Gynecology;  Laterality: N/A;  . NOVASURE ABLATION  10/2005  . SALPINGOOPHORECTOMY  11/23/2011   Procedure: SALPINGO OOPHERECTOMY;  Surgeon: Jolayne Haines, MD;  Location: [facility];  Service: Gynecology;  Laterality: Bilateral;  . SAVORY DILATION N/A 11/12/2020   Procedure: SAVORY DILATION;  Surgeon: Carol Ada, MD;  [facility];  Service: Endoscopy;  Laterality: N/A;  . SVD     x 3  . TOOTH EXTRACTION N/A 04/05/2017   Procedure: DENTAL EXTRACTIONS OF TEETH FIVE, SEVEN, SIXTEEN, SEVENTEEN;  Surgeon: Diona Browner, DDS;  Location: [facility];  Service: Oral Surgery;  Laterality: N/A;  . TUBAL LIGATION    . WISDOM TOOTH EXTRACTION      There were no vitals filed for this visit.   Subjective Assessment - 12/29/20 1754    Subjective Susan Peck reports things are going "pretty good." Susan Peck reports  mild LBP with no symptoms into the BIL LE. She reports adherance to her HEP, performing her exercises nearly every day.    Pertinent History has defibrillator    Limitations House hold activities;Lifting;Standing;Walking;Sitting    How long can you sit comfortably? hours    How long can you stand comfortably? 10-15 min    How long can you walk comfortably? 30 min    Diagnostic tests MRI Lumbar Spine Without Contrast:    IMPRESSION:  Progression of degenerative disease at L5-S1 where a large central  protrusion causes moderate to moderately severe central canal  stenosis and narrowing of both subarticular recesses with  encroachment on the S1 roots. There is also mild bilateral foraminal  narrowing at this level.     Shallow disc bulge and moderate facet arthropathy at L4-5 have  progressed somewhat since the prior exam. There is mild central  canal stenosis at L4-5.    Patient Stated Goals find a way to ease the pain to sleep better    Currently in Pain? Yes    Pain Score 3     Pain Location Back    Pain Orientation Lower    Pain Descriptors / Indicators Burning;Dull;Aching    Pain Type Chronic pain    Pain Onset More than a month ago    Pain Frequency Intermittent                             [facility] Adult Susan Peck Treatment/Exercise - 12/29/20 0001      Lumbar Exercises: Standing   Other Standing Lumbar Exercises Pallof press at cable column 2x10 BIL 7#      Lumbar Exercises: Supine   Bridge --   2x10 with RTB around knees   Large Ball Abdominal Isometric --   3x30sec     Modalities   Modalities Moist Heat      Moist Heat Therapy   Number Minutes Moist Heat 10 Minutes    Moist Heat Location Lumbar Spine      Manual Therapy   Soft tissue mobilization Cross friction and efflourage to BIL lumbar paraspinals and QL                  Susan Peck Education - 12/29/20 1825    Education Details Susan Peck educated on proper form when performing newly added exercises.    Person(s)  Educated Patient    Methods Explanation;Demonstration;Verbal cues;Handout    Comprehension Verbal cues required;Returned demonstration;Verbalized understanding            Susan Peck Short Term Goals - 12/02/20 1849      Susan Peck SHORT TERM GOAL #1  Title Susan Peck will be independent and compliant with initial HEP.    Baseline provided at eval    Time 3    Period Weeks    Status New    Target Date 12/23/20      Susan Peck SHORT TERM GOAL #2   Title Susan Peck will perform posterior pelvic tilt with good form and breathing for improved pelvic alignment and decreased tension on low back from hip flexors.    Baseline intermittently performed correctly during HEP prescription    Time 3    Period Weeks    Status New    Target Date 12/23/20             Susan Peck Long Term Goals - 12/02/20 1853      Susan Peck LONG TERM GOAL #1   Title Susan Peck will be independent with advanced HEP for maintenance.    Time 8    Period Weeks    Status New    Target Date 01/27/21      Susan Peck LONG TERM GOAL #2   Title Susan Peck will increase SLS B to at least 15 seconds for improved stability.    Baseline 5 seconds L, 6 seconds R    Time 8    Period Weeks    Status New    Target Date 01/27/21      Susan Peck LONG TERM GOAL #3   Title Susan Peck will decrease pain level to </=4/10 at worst with rest and activity.    Baseline 10/10    Time 8    Period Weeks    Status New    Target Date 01/27/21      Susan Peck LONG TERM GOAL #4   Title Susan Peck will be able to sleep >5 hours at a time without pain waking her up.    Baseline 4 hours    Time 8    Period Weeks    Status New    Target Date 01/27/21      Susan Peck LONG TERM GOAL #5   Title Susan Peck will increase FOTO ability to at least 66% ability, in order to demonstrate meaningful change in perceived level of functional ability.    Baseline 53% ability    Time 8    Period Weeks    Status New    Target Date 01/27/21                 Plan - 12/29/20 1826    Clinical Impression Statement Susan Peck responded well to all treatment today,  demonstrating the ability to perform all exercises with proper form and without increase in pain. She noted decreased in pain post session following manual therapy and moist heat. Susan Peck continues to progress well with therapy, showing improving functional activity tolerance and decrease in pain. Susan Peck should continue to focus on increasing core/ hip strengthening in order to improve functional ability and decrease pain.    Personal Factors and Comorbidities Time since onset of injury/illness/exacerbation;Fitness    Examination-Activity Limitations Stairs;Squat;Lift;Bend;Sleep;Locomotion Level;Transfers    Examination-Participation Restrictions Cleaning;Laundry;Community Activity;Interpersonal Relationship    Stability/Clinical Decision Making Evolving/Moderate complexity    Clinical Decision Making Moderate    Rehab Potential Good    Susan Peck Frequency --   1-2x/week   Susan Peck Duration 8 weeks    Susan Peck Treatment/Interventions ADLs/Self Care Home Management;Aquatic Therapy;Cryotherapy;Moist Heat;Spinal Manipulations;Joint Manipulations;Dry needling;Passive range of motion;Taping;Manual techniques;Patient/family education;Therapeutic exercise;Gait training;Stair training;Traction;Therapeutic activities;Functional mobility training;Balance training;Neuromuscular re-education    Susan Peck Next Visit Plan continue to progress core/hip strengthening; assess FOTO at next visit  Susan Peck Home Exercise Plan [email or web address]    Consulted and Agree with Plan of Care Patient           Patient will benefit from skilled therapeutic intervention in order to improve the following deficits and impairments:  Decreased balance,Decreased endurance,Difficulty walking,Obesity,Pain,Postural dysfunction,Impaired flexibility,Decreased strength,Decreased activity tolerance,Impaired perceived functional ability,Improper body mechanics,Decreased range of motion  Visit Diagnosis: Chronic bilateral low back pain with bilateral sciatica  Muscle weakness  (generalized)  Abnormal posture  Difficulty in walking, not elsewhere classified     Problem List Patient Active Problem List   Diagnosis Date Noted  . Dysphagia 10/06/2020  . ICD (implantable cardioverter-defibrillator) in place-MDT 04/25/2020  . Recent Coronavirus infection 09/01/2019  . Insomnia 07/02/2018  . Depression 07/02/2018  . Restless leg syndrome 03/12/2018  . Constipation 02/21/2018  . Asthma 06/12/2017  . Healthcare maintenance 06/06/2017  . Right shoulder pain 06/05/2017  . Chronic low back pain 04/03/2017  . NICM (nonischemic cardiomyopathy) ([place]) 12/13/2016  . LBBB (left bundle branch block) 12/13/2016  . Chronic pansinusitis 09/20/2016  . Laryngopharyngeal reflux (LPR) 09/20/2016  . HLD (hyperlipidemia) 08/18/2016  . History of colonic polyps 03/08/2016  . Chronic hip pain 12/28/2015  . Diabetes mellitus type II, uncontrolled ([place]) 10/05/2015  . HTN (hypertension) 09/17/2014  . Goiter 07/10/2011  . Obstructive sleep apnea 05/29/2011  . Chronic systolic heart failure ([place]) 04/10/2011    Susan Peck, Susan Peck, Susan Peck 12/29/20 6:29 PM   [facility] [address] [place], [place], [address] Phone: [phone]   Fax:  [phone]  Name: Susan Peck MRN: [id number] Date of Birth: 03/01/1966

## 2020-12-29 NOTE — Patient Instructions (Signed)
  [email or web address]

## 2021-01-03 ENCOUNTER — Ambulatory Visit (INDEPENDENT_AMBULATORY_CARE_PROVIDER_SITE_OTHER): Payer: Medicare Other

## 2021-01-03 DIAGNOSIS — I428 Other cardiomyopathies: Secondary | ICD-10-CM | POA: Diagnosis not present

## 2021-01-04 ENCOUNTER — Encounter: Payer: Self-pay | Admitting: Physical Therapy

## 2021-01-04 ENCOUNTER — Ambulatory Visit: Payer: Medicare Other | Admitting: Physical Therapy

## 2021-01-04 ENCOUNTER — Other Ambulatory Visit: Payer: Self-pay

## 2021-01-04 ENCOUNTER — Ambulatory Visit (INDEPENDENT_AMBULATORY_CARE_PROVIDER_SITE_OTHER): Payer: Medicare Other

## 2021-01-04 DIAGNOSIS — I5022 Chronic systolic (congestive) heart failure: Secondary | ICD-10-CM | POA: Diagnosis not present

## 2021-01-04 DIAGNOSIS — R293 Abnormal posture: Secondary | ICD-10-CM

## 2021-01-04 DIAGNOSIS — M6281 Muscle weakness (generalized): Secondary | ICD-10-CM

## 2021-01-04 DIAGNOSIS — G8929 Other chronic pain: Secondary | ICD-10-CM

## 2021-01-04 DIAGNOSIS — Z95810 Presence of automatic (implantable) cardiac defibrillator: Secondary | ICD-10-CM

## 2021-01-04 DIAGNOSIS — R262 Difficulty in walking, not elsewhere classified: Secondary | ICD-10-CM

## 2021-01-04 DIAGNOSIS — M5442 Lumbago with sciatica, left side: Secondary | ICD-10-CM | POA: Diagnosis not present

## 2021-01-04 LAB — CUP PACEART REMOTE DEVICE CHECK
Battery Remaining Longevity: 31 mo
Battery Voltage: 2.95 V
Brady Statistic AP VP Percent: 0.25 %
Brady Statistic AP VS Percent: 0.01 %
Brady Statistic AS VP Percent: 99.67 %
Brady Statistic AS VS Percent: 0.07 %
Brady Statistic RA Percent Paced: 0.26 %
Brady Statistic RV Percent Paced: 99.79 %
Date Time Interrogation Session: 20220613012508
HighPow Impedance: 74 Ohm
Implantable Lead Implant Date: 20180524
Implantable Lead Implant Date: 20180524
Implantable Lead Implant Date: 20180524
Implantable Lead Location: 753858
Implantable Lead Location: 753859
Implantable Lead Location: 753860
Implantable Lead Model: 4398
Implantable Lead Model: 5076
Implantable Pulse Generator Implant Date: 20180524
Lead Channel Impedance Value: 189.525
Lead Channel Impedance Value: 199.5 Ohm
Lead Channel Impedance Value: 201.488
Lead Channel Impedance Value: 212.8 Ohm
Lead Channel Impedance Value: 212.8 Ohm
Lead Channel Impedance Value: 361 Ohm
Lead Channel Impedance Value: 399 Ohm
Lead Channel Impedance Value: 399 Ohm
Lead Channel Impedance Value: 399 Ohm
Lead Channel Impedance Value: 456 Ohm
Lead Channel Impedance Value: 456 Ohm
Lead Channel Impedance Value: 475 Ohm
Lead Channel Impedance Value: 608 Ohm
Lead Channel Impedance Value: 627 Ohm
Lead Channel Impedance Value: 665 Ohm
Lead Channel Impedance Value: 665 Ohm
Lead Channel Impedance Value: 722 Ohm
Lead Channel Impedance Value: 741 Ohm
Lead Channel Pacing Threshold Amplitude: 0.375 V
Lead Channel Pacing Threshold Amplitude: 0.5 V
Lead Channel Pacing Threshold Amplitude: 0.625 V
Lead Channel Pacing Threshold Pulse Width: 0.4 ms
Lead Channel Pacing Threshold Pulse Width: 0.4 ms
Lead Channel Pacing Threshold Pulse Width: 1 ms
Lead Channel Sensing Intrinsic Amplitude: 22.625 mV
Lead Channel Sensing Intrinsic Amplitude: 22.625 mV
Lead Channel Sensing Intrinsic Amplitude: 3.75 mV
Lead Channel Sensing Intrinsic Amplitude: 3.75 mV
Lead Channel Setting Pacing Amplitude: 0.75 V
Lead Channel Setting Pacing Amplitude: 1.5 V
Lead Channel Setting Pacing Amplitude: 2.5 V
Lead Channel Setting Pacing Pulse Width: 0.4 ms
Lead Channel Setting Pacing Pulse Width: 1 ms
Lead Channel Setting Sensing Sensitivity: 0.3 mV

## 2021-01-04 NOTE — Therapy (Signed)
[place], [place], [address] Phone: [phone]   Fax:  [phone]  Physical Therapy Treatment  Patient Details  Name: Susan Peck MRN: [id number] Date of Birth: [birth year] Referring Provider (PT): Lucious Groves, DO   Encounter Date: 01/04/2021   PT End of Session - 01/04/21 1658     Visit Number 7    Number of Visits 16    Date for PT Re-Evaluation 01/27/21    Authorization Type UHC Medicare    Authorization Time Period Foto Visit 6 and 10    Progress Note Due on Visit 10    PT Start Time 1620    PT Stop Time 1700    PT Time Calculation (min) 40 min    Activity Tolerance Patient tolerated treatment well    Behavior During Therapy [facility] for tasks assessed/performed             Past Medical History:  Diagnosis Date   AICD (automatic cardioverter/defibrillator) present 12/13/2016   Anxiety    no meds   Asthma    CHF (congestive heart failure) ([place])    Depression    no meds   Diabetes mellitus    recent dx 11/17/11 - started med 11/18/11 type 2   GERD (gastroesophageal reflux disease)    Goiter 07/27/2011   non-neoplastic goiter - fine needle aspiration - benign sees dr Dwyane Dee for   Heart murmur    dx 2 yrs ago per pt   Herpes genitalis in women    Hypertension    IBS (irritable bowel syndrome)    tx with diet per pt   Low back pain    history   Obesity    Seasonal allergies    Shortness of breath    occasional - exercise induced   Sleep apnea    cpap broken   Systolic heart failure    May 2011 EF 35-40%, 04/03/11 EF 25-30%, 06/27/11 EF 30-35%    Past Surgical History:  Procedure Laterality Date   ABDOMINAL HYSTERECTOMY     BIV ICD INSERTION CRT-D N/A 12/13/2016   Procedure: BiV ICD Insertion CRT-D;  Surgeon: Deboraha Sprang, MD;  Location: [facility];  Service: Cardiovascular;  Laterality: N/A;   cardiac catherization  2004   [place], [place] - Dr Lynnell Jude   CARDIAC  CATHETERIZATION     CARDIAC CATHETERIZATION N/A 09/15/2015   Procedure: Right/Left Heart Cath and Coronary Angiography;  Surgeon: Jolaine Artist, MD;  Location: [facility];  Service: Cardiovascular;  Laterality: N/A;   COLONOSCOPY WITH PROPOFOL N/A 03/26/2015   Procedure: COLONOSCOPY WITH PROPOFOL;  Surgeon: Carol Ada, MD;  [facility];  Service: Endoscopy;  Laterality: N/A;   colonscopy     CYSTOSCOPY  11/23/2011   Procedure: CYSTOSCOPY;  Surgeon: Jolayne Haines, MD;  Location: [facility];  Service: Gynecology;  Laterality: N/A;   DIAGNOSTIC LAPAROSCOPY     of pelvis   DILATION AND CURETTAGE OF UTERUS  12/2006,  10/2005   hysteroscopy surgery x 2   ESOPHAGOGASTRODUODENOSCOPY (EGD) WITH PROPOFOL N/A 11/12/2020   Procedure: ESOPHAGOGASTRODUODENOSCOPY (EGD) WITH PROPOFOL;  Surgeon: Carol Ada, MD;  [facility];  Service: Endoscopy;  Laterality: N/A;   INSERTION OF ICD  12/13/2016   BIV   LAPAROSCOPIC ASSISTED VAGINAL HYSTERECTOMY  11/23/2011   Procedure: LAPAROSCOPIC ASSISTED VAGINAL HYSTERECTOMY;  Surgeon: Jolayne Haines, MD;  Location: [facility];  Service: Gynecology;  Laterality: N/A;   NOVASURE  ABLATION     10/2005   SALPINGOOPHORECTOMY  11/23/2011   Procedure: SALPINGO OOPHERECTOMY;  Surgeon: Jolayne Haines, MD;  Location: [facility];  Service: Gynecology;  Laterality: Bilateral;   SAVORY DILATION N/A 11/12/2020   Procedure: SAVORY DILATION;  Surgeon: Carol Ada, MD;  [facility];  Service: Endoscopy;  Laterality: N/A;   SVD     x 3   TOOTH EXTRACTION N/A 04/05/2017   Procedure: DENTAL EXTRACTIONS OF TEETH FIVE, SEVEN, SIXTEEN, SEVENTEEN;  Surgeon: Diona Browner, DDS;  Location: [facility];  Service: Oral Surgery;  Laterality: N/A;   TUBAL LIGATION     WISDOM TOOTH EXTRACTION      There were no vitals filed for this visit.   Subjective Assessment - 01/04/21 1625     Subjective Patient reports had trouble with the ball press were too hard to do, otherwise  doing the most of the exercises.    Pertinent History has defibrillator    Limitations House hold activities;Lifting;Standing;Walking;Sitting    Diagnostic tests MRI Lumbar Spine Without Contrast:    IMPRESSION:  Progression of degenerative disease at L5-S1 where a large central  protrusion causes moderate to moderately severe central canal  stenosis and narrowing of both subarticular recesses with  encroachment on the S1 roots. There is also mild bilateral foraminal  narrowing at this level.     Shallow disc bulge and moderate facet arthropathy at L4-5 have  progressed somewhat since the prior exam. There is mild central  canal stenosis at L4-5.    Patient Stated Goals find a way to ease the pain to sleep better    Currently in Pain? Yes    Pain Score 2     Pain Location Back                [facility] PT Assessment - 01/04/21 0001       Assessment   Medical Diagnosis M54.50,G89.29 (ICD-10-CM) - Chronic low back pain, unspecified back pain laterality, unspecified whether sciatica present    Referring Provider (PT) Lucious Groves, DO    Hand Dominance Right    Next MD Visit doesn't know when      Precautions   Precautions None      Observation/Other Assessments   Focus on Therapeutic Outcomes (FOTO)  72% exceeds predicted 66%                           [facility] Adult PT Treatment/Exercise - 01/04/21 0001       Exercises   Other Exercises  Standing heel raises x20, gast/sol stretch 2x30sec ea      Lumbar Exercises: Stretches   Passive Hamstring Stretch Right;Left;2 reps;20 seconds    Piriformis Stretch Right;Left;20 seconds    Piriformis Stretch Limitations seated    Other Lumbar Stretch Exercise seated ball rollouts x 10 fwd/R/L green physioball      Lumbar Exercises: Standing   Other Standing Lumbar Exercises Pallof press at 2x10 BLUE    Other Standing Lumbar Exercises Lung onto BOSU dome up 2x10ea      Manual Therapy   Manual Therapy Soft tissue mobilization     Soft tissue mobilization STM B lumbar/thor paraspinals with palpable tender points R>L                    PT Education - 01/04/21 1929     Education Details Patient given repeated standing lumbar extension multiple times throughout the day for  radicular symptom management.    Person(s) Educated Patient    Methods Explanation;Demonstration;Verbal cues;Handout    Comprehension Verbalized understanding;Returned demonstration              PT Short Term Goals - 01/04/21 1925       PT SHORT TERM GOAL #1   Title Pt will be independent and compliant with initial HEP.    Baseline provided at eval    Time 3    Period Weeks    Status Achieved      PT SHORT TERM GOAL #2   Title Pt will perform posterior pelvic tilt with good form and breathing for improved pelvic alignment and decreased tension on low back from hip flexors.    Baseline intermittently performed correctly during HEP prescription    Time 3    Period Weeks    Status Achieved               PT Long Term Goals - 01/04/21 1925       PT LONG TERM GOAL #5   Title Pt will increase FOTO ability to at least 66% ability, in order to demonstrate meaningful change in perceived level of functional ability.    Baseline 53% ability    Time 8    Period Weeks    Status Achieved   72%                  Plan - 01/04/21 1625     Clinical Impression Statement Patient has been progressing well with decreased pain and back discomfort.  Patient responded well to standing lumbar extension with centralized radicular symptom.  Patient continues to be challenged by tight back musculature R worse than L with hamstring and piriformis stretches helpful.  Patient would benefit from continued skilled PT to address deficits and maximize daily tasks with decreased pain.    Personal Factors and Comorbidities Time since onset of injury/illness/exacerbation;Fitness    Examination-Activity Limitations  Stairs;Squat;Lift;Bend;Sleep;Locomotion Level;Transfers    Examination-Participation Restrictions Cleaning;Laundry;Community Activity;Interpersonal Relationship    PT Treatment/Interventions ADLs/Self Care Home Management;Aquatic Therapy;Cryotherapy;Moist Heat;Spinal Manipulations;Joint Manipulations;Dry needling;Passive range of motion;Taping;Manual techniques;Patient/family education;Therapeutic exercise;Gait training;Stair training;Traction;Therapeutic activities;Functional mobility training;Balance training;Neuromuscular re-education    PT Next Visit Plan continue to progress core/hip strengthening; assess effectiveness of extension biased exercises to reduce radicular symptoms    PT Home Exercise Plan [email or web address]    Consulted and Agree with Plan of Care Patient             Patient will benefit from skilled therapeutic intervention in order to improve the following deficits and impairments:  Decreased balance, Decreased endurance, Difficulty walking, Obesity, Pain, Postural dysfunction, Impaired flexibility, Decreased strength, Decreased activity tolerance, Impaired perceived functional ability, Improper body mechanics, Decreased range of motion  Visit Diagnosis: Chronic bilateral low back pain with bilateral sciatica  Muscle weakness (generalized)  Abnormal posture  Difficulty in walking, not elsewhere classified     Problem List Patient Active Problem List   Diagnosis Date Noted   Dysphagia 10/06/2020   ICD (implantable cardioverter-defibrillator) in place-MDT 04/25/2020   Recent Coronavirus infection 09/01/2019   Insomnia 07/02/2018   Depression 07/02/2018   Restless leg syndrome 03/12/2018   Constipation 02/21/2018   Asthma 06/12/2017   Healthcare maintenance 06/06/2017   Right shoulder pain 06/05/2017   Chronic low back pain 04/03/2017   NICM (nonischemic cardiomyopathy) ([place]) 12/13/2016   LBBB (left bundle branch block) 12/13/2016   Chronic pansinusitis 09/20/2016    Laryngopharyngeal reflux (LPR) 09/20/2016   HLD (  hyperlipidemia) 08/18/2016   History of colonic polyps 03/08/2016   Chronic hip pain 12/28/2015   Diabetes mellitus type II, uncontrolled ([place]) 10/05/2015   HTN (hypertension) 09/17/2014   Goiter 07/10/2011   Obstructive sleep apnea 22/29/7989   Chronic systolic heart failure ([place]) 04/10/2011    Pollyann Samples, PT 01/04/2021, 7:34 PM  [facility] [address] [place], [place], [address] Phone: [phone]   Fax:  [phone]  Name: Susan Peck MRN: [id number] Date of Birth: [birth year]

## 2021-01-04 NOTE — Patient Instructions (Signed)
Access Code: [email or web address] URL: [email or web address] Date: 01/04/2021 Prepared by: Pollyann Samples  NEW Exercises  Standing Lumbar Extension - 1 x daily - 7 x weekly - 3 sets - 10 reps

## 2021-01-07 ENCOUNTER — Other Ambulatory Visit (HOSPITAL_COMMUNITY): Payer: Self-pay | Admitting: Internal Medicine

## 2021-01-07 NOTE — Progress Notes (Signed)
EPIC Encounter for ICM Monitoring  Patient Name: Susan Peck is a 55 y.o. female Date: 01/07/2021 Primary Care Physican: Harvie Heck, MD Primary Cardiologist: Caryl Comes (Bensimhon as needed) Electrophysiologist: Vergie Living Pacing: 99.8%  01/07/2021 Weight: 195 lbs     Spoke with patient and reports feeling well at this time. Heart failure questions reviewed. Pt asymptomatic.   Optivol Thoracic impedance normal.     Prescribed:  Furosemide 40 mg take 2 tablets (80 mg total) every morning and 1 tablet (40 mg total) every evening.   Labs: 12/29/2019 Creatinine 0.55, BUN 11, Potassium 3.6, Sodium 141, GFR >60 A complete set of results can be found in Results Review.   Recommendations:  No changes and encouraged to call if experiencing any fluid symptoms.   Follow-up plan: ICM clinic phone appointment on 02/14/2021.   91 day device clinic remote transmission 04/04/2021.     EP/Cardiology Office Visits:   01/21/2021 with Dr Caryl Comes.   Copy of ICM check sent to Dr. Caryl Comes.   3 month ICM trend: 01/03/2021.    1 Year ICM trend:       [name], RN 01/07/2021 1:17 PM

## 2021-01-11 ENCOUNTER — Other Ambulatory Visit: Payer: Self-pay

## 2021-01-11 ENCOUNTER — Ambulatory Visit: Payer: Medicare Other | Admitting: Physical Therapy

## 2021-01-11 ENCOUNTER — Encounter: Payer: Self-pay | Admitting: Physical Therapy

## 2021-01-11 DIAGNOSIS — M5442 Lumbago with sciatica, left side: Secondary | ICD-10-CM | POA: Diagnosis not present

## 2021-01-11 DIAGNOSIS — G8929 Other chronic pain: Secondary | ICD-10-CM

## 2021-01-11 DIAGNOSIS — R293 Abnormal posture: Secondary | ICD-10-CM

## 2021-01-11 DIAGNOSIS — M6281 Muscle weakness (generalized): Secondary | ICD-10-CM

## 2021-01-11 DIAGNOSIS — R262 Difficulty in walking, not elsewhere classified: Secondary | ICD-10-CM

## 2021-01-11 NOTE — Therapy (Deleted)
[place], [place], [address] Phone: [phone]   Fax:  [phone]  Physical Therapy Treatment  Patient Details  Name: Susan Peck MRN: [id number] Date of Birth: Aug 15, 1965 Referring Provider (PT): Lucious Groves, DO   Encounter Date: 01/11/2021   PT End of Session - 01/11/21 1836     Visit Number 8    Number of Visits 16    Date for PT Re-Evaluation 01/27/21    Authorization Type UHC Medicare    Authorization Time Period Foto Visit 6 and 10    Progress Note Due on Visit 10    Activity Tolerance Patient tolerated treatment well    Behavior During Therapy [facility] for tasks assessed/performed             Past Medical History:  Diagnosis Date   AICD (automatic cardioverter/defibrillator) present 12/13/2016   Anxiety    no meds   Asthma    CHF (congestive heart failure) ([place])    Depression    no meds   Diabetes mellitus    recent dx 11/17/11 - started med 11/18/11 type 2   GERD (gastroesophageal reflux disease)    Goiter 07/27/2011   non-neoplastic goiter - fine needle aspiration - benign sees dr Dwyane Dee for   Heart murmur    dx 2 yrs ago per pt   Herpes genitalis in women    Hypertension    IBS (irritable bowel syndrome)    tx with diet per pt   Low back pain    history   Obesity    Seasonal allergies    Shortness of breath    occasional - exercise induced   Sleep apnea    cpap broken   Systolic heart failure    May 2011 EF 35-40%, 04/03/11 EF 25-30%, 06/27/11 EF 30-35%    Past Surgical History:  Procedure Laterality Date   ABDOMINAL HYSTERECTOMY     BIV ICD INSERTION CRT-D N/A 12/13/2016   Procedure: BiV ICD Insertion CRT-D;  Surgeon: Deboraha Sprang, MD;  Location: [place] [facility];  Service: Cardiovascular;  Laterality: N/A;   cardiac catherization  2004   [place], [place] - Dr Lynnell Jude   CARDIAC CATHETERIZATION     CARDIAC CATHETERIZATION N/A 09/15/2015   Procedure: Right/Left  Heart Cath and Coronary Angiography;  Surgeon: Jolaine Artist, MD;  Location: [place] [facility];  Service: Cardiovascular;  Laterality: N/A;   COLONOSCOPY WITH PROPOFOL N/A 03/26/2015   Procedure: COLONOSCOPY WITH PROPOFOL;  Surgeon: Carol Ada, MD;  [facility];  Service: Endoscopy;  Laterality: N/A;   colonscopy     CYSTOSCOPY  11/23/2011   Procedure: CYSTOSCOPY;  Surgeon: Jolayne Haines, MD;  Location: [facility] [facility];  Service: Gynecology;  Laterality: N/A;   DIAGNOSTIC LAPAROSCOPY     of pelvis   DILATION AND CURETTAGE OF UTERUS  12/2006,  10/2005   hysteroscopy surgery x 2   ESOPHAGOGASTRODUODENOSCOPY (EGD) WITH PROPOFOL N/A 11/12/2020   Procedure: ESOPHAGOGASTRODUODENOSCOPY (EGD) WITH PROPOFOL;  Surgeon: Carol Ada, MD;  [facility];  Service: Endoscopy;  Laterality: N/A;   INSERTION OF ICD  12/13/2016   BIV   LAPAROSCOPIC ASSISTED VAGINAL HYSTERECTOMY  11/23/2011   Procedure: LAPAROSCOPIC ASSISTED VAGINAL HYSTERECTOMY;  Surgeon: Jolayne Haines, MD;  Location: [facility];  Service: Gynecology;  Laterality: N/A;   NOVASURE ABLATION     10/2005   SALPINGOOPHORECTOMY  11/23/2011   Procedure: SALPINGO OOPHERECTOMY;  Surgeon: Jolayne Haines, MD;  Location: [facility];  Service: Gynecology;  Laterality: Bilateral;   SAVORY DILATION N/A 11/12/2020   Procedure: SAVORY DILATION;  Surgeon: Carol Ada, MD;  [facility];  Service: Endoscopy;  Laterality: N/A;   SVD     x 3   TOOTH EXTRACTION N/A 04/05/2017   Procedure: DENTAL EXTRACTIONS OF TEETH FIVE, SEVEN, SIXTEEN, SEVENTEEN;  Surgeon: Diona Browner, DDS;  Location: [facility];  Service: Oral Surgery;  Laterality: N/A;   TUBAL LIGATION     WISDOM TOOTH EXTRACTION      There were no vitals filed for this visit.                                PT Short Term Goals - 01/04/21 1925       PT SHORT TERM GOAL #1   Title Pt will be independent and compliant with initial HEP.    Baseline provided at eval     Time 3    Period Weeks    Status Achieved      PT SHORT TERM GOAL #2   Title Pt will perform posterior pelvic tilt with good form and breathing for improved pelvic alignment and decreased tension on low back from hip flexors.    Baseline intermittently performed correctly during HEP prescription    Time 3    Period Weeks    Status Achieved               PT Long Term Goals - 01/04/21 1925       PT LONG TERM GOAL #5   Title Pt will increase FOTO ability to at least 66% ability, in order to demonstrate meaningful change in perceived level of functional ability.    Baseline 53% ability    Time 8    Period Weeks    Status Achieved   72%                   Patient will benefit from skilled therapeutic intervention in order to improve the following deficits and impairments:     Visit Diagnosis: Chronic bilateral low back pain with bilateral sciatica  Muscle weakness (generalized)  Abnormal posture  Difficulty in walking, not elsewhere classified     Problem List Patient Active Problem List   Diagnosis Date Noted   Dysphagia 10/06/2020   ICD (implantable cardioverter-defibrillator) in place-MDT 04/25/2020   Recent Coronavirus infection 09/01/2019   Insomnia 07/02/2018   Depression 07/02/2018   Restless leg syndrome 03/12/2018   Constipation 02/21/2018   Asthma 06/12/2017   Healthcare maintenance 06/06/2017   Right shoulder pain 06/05/2017   Chronic low back pain 04/03/2017   NICM (nonischemic cardiomyopathy) ([place]) 12/13/2016   LBBB (left bundle branch block) 12/13/2016   Chronic pansinusitis 09/20/2016   Laryngopharyngeal reflux (LPR) 09/20/2016   HLD (hyperlipidemia) 08/18/2016   History of colonic polyps 03/08/2016   Chronic hip pain 12/28/2015   Diabetes mellitus type II, uncontrolled ([place]) 10/05/2015   HTN (hypertension) 09/17/2014   Goiter 07/10/2011   Obstructive sleep apnea 41/66/0630   Chronic systolic heart failure ([place]) 04/10/2011     [name] Balo PT, DPT 01/11/21 7:01 PM  [facility] [address] [place], [place], [address] Phone: [phone]   Fax:  [phone]  Name: Susan Peck MRN: [id number] Date of Birth: 1965-08-29

## 2021-01-12 NOTE — Therapy (Signed)
[place], [place], [address] Phone: [phone]   Fax:  [phone]  Physical Therapy Treatment  Patient Details  Name: Susan Peck MRN: [id number] Date of Birth: 03-30-1966 Referring Provider (PT): Lucious Groves, DO   Encounter Date: 01/11/2021   PT End of Session - 01/11/21 1836     Visit Number 8    Number of Visits 16    Date for PT Re-Evaluation 01/27/21    Authorization Type UHC Medicare    Authorization Time Period Foto Visit 6 and 10    Progress Note Due on Visit 10    PT Start Time 1837    PT Stop Time 1915    PT Time Calculation (min) 38 min    Activity Tolerance Patient tolerated treatment well    Behavior During Therapy [facility] for tasks assessed/performed             Past Medical History:  Diagnosis Date   AICD (automatic cardioverter/defibrillator) present 12/13/2016   Anxiety    no meds   Asthma    CHF (congestive heart failure) ([place])    Depression    no meds   Diabetes mellitus    recent dx 11/17/11 - started med 11/18/11 type 2   GERD (gastroesophageal reflux disease)    Goiter 07/27/2011   non-neoplastic goiter - fine needle aspiration - benign sees dr Dwyane Dee for   Heart murmur    dx 2 yrs ago per pt   Herpes genitalis in women    Hypertension    IBS (irritable bowel syndrome)    tx with diet per pt   Low back pain    history   Obesity    Seasonal allergies    Shortness of breath    occasional - exercise induced   Sleep apnea    cpap broken   Systolic heart failure    May 2011 EF 35-40%, 04/03/11 EF 25-30%, 06/27/11 EF 30-35%    Past Surgical History:  Procedure Laterality Date   ABDOMINAL HYSTERECTOMY     BIV ICD INSERTION CRT-D N/A 12/13/2016   Procedure: BiV ICD Insertion CRT-D;  Surgeon: Deboraha Sprang, MD;  Location: [place] [facility];  Service: Cardiovascular;  Laterality: N/A;   cardiac catherization  2004   [place], [place] - Dr Lynnell Jude   CARDIAC  CATHETERIZATION     CARDIAC CATHETERIZATION N/A 09/15/2015   Procedure: Right/Left Heart Cath and Coronary Angiography;  Surgeon: Jolaine Artist, MD;  Location: [place] [facility];  Service: Cardiovascular;  Laterality: N/A;   COLONOSCOPY WITH PROPOFOL N/A 03/26/2015   Procedure: COLONOSCOPY WITH PROPOFOL;  Surgeon: Carol Ada, MD;  [facility];  Service: Endoscopy;  Laterality: N/A;   colonscopy     CYSTOSCOPY  11/23/2011   Procedure: CYSTOSCOPY;  Surgeon: Jolayne Haines, MD;  Location: [facility];  Service: Gynecology;  Laterality: N/A;   DIAGNOSTIC LAPAROSCOPY     of pelvis   DILATION AND CURETTAGE OF UTERUS  12/2006,  10/2005   hysteroscopy surgery x 2   ESOPHAGOGASTRODUODENOSCOPY (EGD) WITH PROPOFOL N/A 11/12/2020   Procedure: ESOPHAGOGASTRODUODENOSCOPY (EGD) WITH PROPOFOL;  Surgeon: Carol Ada, MD;  [facility];  Service: Endoscopy;  Laterality: N/A;   INSERTION OF ICD  12/13/2016   BIV   LAPAROSCOPIC ASSISTED VAGINAL HYSTERECTOMY  11/23/2011   Procedure: LAPAROSCOPIC ASSISTED VAGINAL HYSTERECTOMY;  Surgeon: Jolayne Haines, MD;  Location: [facility];  Service: Gynecology;  Laterality: N/A;   NOVASURE  ABLATION     10/2005   SALPINGOOPHORECTOMY  11/23/2011   Procedure: SALPINGO OOPHERECTOMY;  Surgeon: Jolayne Haines, MD;  Location: [facility];  Service: Gynecology;  Laterality: Bilateral;   SAVORY DILATION N/A 11/12/2020   Procedure: SAVORY DILATION;  Surgeon: Carol Ada, MD;  [facility];  Service: Endoscopy;  Laterality: N/A;   SVD     x 3   TOOTH EXTRACTION N/A 04/05/2017   Procedure: DENTAL EXTRACTIONS OF TEETH FIVE, SEVEN, SIXTEEN, SEVENTEEN;  Surgeon: Diona Browner, DDS;  Location: [place];  Service: Oral Surgery;  Laterality: N/A;   TUBAL LIGATION     WISDOM TOOTH EXTRACTION      There were no vitals filed for this visit.   Subjective Assessment - 01/11/21 1837     Subjective Pt reports that her back is doing well today.  She feels her back pain is  improving.  She feels she is better able to manage her pain.    Pertinent History has defibrillator    Limitations House hold activities;Lifting;Standing;Walking;Sitting    Diagnostic tests MRI Lumbar Spine Without Contrast:    IMPRESSION:  Progression of degenerative disease at L5-S1 where a large central  protrusion causes moderate to moderately severe central canal  stenosis and narrowing of both subarticular recesses with  encroachment on the S1 roots. There is also mild bilateral foraminal  narrowing at this level.     Shallow disc bulge and moderate facet arthropathy at L4-5 have  progressed somewhat since the prior exam. There is mild central  canal stenosis at L4-5.    Patient Stated Goals find a way to ease the pain to sleep better    Currently in Pain? Yes    Pain Score 3     Pain Location Back    Pain Orientation Lower                               [facility] Adult PT Treatment/Exercise - 01/12/21 0001       Lumbar Exercises: Aerobic   Nustep lvl 5 x 5 min while taking subjective UE/LE      Lumbar Exercises: Machines for Strengthening   Cybex Knee Extension 3x10 15#    Cybex Knee Flexion 3x10 15#    Leg Press 3x10 45lbs    Other Lumbar Machine Exercise hip abduction - 2x10 ea @25 #      Lumbar Exercises: Standing   Other Standing Lumbar Exercises Pallof press at 2x10 10#    Other Standing Lumbar Exercises Lunge onto BOSU dome up 2x10ea      Lumbar Exercises: Quadruped   Other Quadruped Lumbar Exercises leg kick - 3x10                      PT Short Term Goals - 01/04/21 1925       PT SHORT TERM GOAL #1   Title Pt will be independent and compliant with initial HEP.    Baseline provided at eval    Time 3    Period Weeks    Status Achieved      PT SHORT TERM GOAL #2   Title Pt will perform posterior pelvic tilt with good form and breathing for improved pelvic alignment and decreased tension on low back from hip flexors.    Baseline  intermittently performed correctly during HEP prescription    Time 3    Period Weeks    Status Achieved  PT Long Term Goals - 01/04/21 1925       PT LONG TERM GOAL #5   Title Pt will increase FOTO ability to at least 66% ability, in order to demonstrate meaningful change in perceived level of functional ability.    Baseline 53% ability    Time 8    Period Weeks    Status Achieved   72%                  Plan - 01/11/21 1859     Clinical Impression Statement Ted B Elenes is progressing well with therapy.  Today we concentrated on core strengthening and lower extremity strengthening.  Pt fatigues rapidly with quadruped kicks but demonstrates good form with min cueing.  Pt reports no change in baseline pain following therapy.  Next session we will continue current course, progressing as able/appropriate.  HEP was not updated as current program meets patient's needs.  Pt will continue to benefit from skilled physical therapy to address remaining deficits and achieve listed goals.  Continue per POC.    Personal Factors and Comorbidities Time since onset of injury/illness/exacerbation;Fitness    Examination-Activity Limitations Stairs;Squat;Lift;Bend;Sleep;Locomotion Level;Transfers    Examination-Participation Restrictions Cleaning;Laundry;Community Activity;Interpersonal Relationship    PT Treatment/Interventions ADLs/Self Care Home Management;Aquatic Therapy;Cryotherapy;Moist Heat;Spinal Manipulations;Joint Manipulations;Dry needling;Passive range of motion;Taping;Manual techniques;Patient/family education;Therapeutic exercise;Gait training;Stair training;Traction;Therapeutic activities;Functional mobility training;Balance training;Neuromuscular re-education    PT Next Visit Plan continue to progress core/hip strengthening; assess effectiveness of extension biased exercises to reduce radicular symptoms    PT Home Exercise Plan [email or web address]    Consulted and Agree with  Plan of Care Patient             Patient will benefit from skilled therapeutic intervention in order to improve the following deficits and impairments:  Decreased balance, Decreased endurance, Difficulty walking, Obesity, Pain, Postural dysfunction, Impaired flexibility, Decreased strength, Decreased activity tolerance, Impaired perceived functional ability, Improper body mechanics, Decreased range of motion  Visit Diagnosis: Chronic bilateral low back pain with bilateral sciatica  Muscle weakness (generalized)  Abnormal posture  Difficulty in walking, not elsewhere classified     Problem List Patient Active Problem List   Diagnosis Date Noted   Dysphagia 10/06/2020   ICD (implantable cardioverter-defibrillator) in place-MDT 04/25/2020   Recent Coronavirus infection 09/01/2019   Insomnia 07/02/2018   Depression 07/02/2018   Restless leg syndrome 03/12/2018   Constipation 02/21/2018   Asthma 06/12/2017   Healthcare maintenance 06/06/2017   Right shoulder pain 06/05/2017   Chronic low back pain 04/03/2017   NICM (nonischemic cardiomyopathy) ([place]) 12/13/2016   LBBB (left bundle branch block) 12/13/2016   Chronic pansinusitis 09/20/2016   Laryngopharyngeal reflux (LPR) 09/20/2016   HLD (hyperlipidemia) 08/18/2016   History of colonic polyps 03/08/2016   Chronic hip pain 12/28/2015   Diabetes mellitus type II, uncontrolled ([place]) 10/05/2015   HTN (hypertension) 09/17/2014   Goiter 07/10/2011   Obstructive sleep apnea 84/69/6295   Chronic systolic heart failure ([place]) 04/10/2011    [name] Balo PT, DPT 01/12/21 11:53 AM   [facility] [address] [place], [place], [address] Phone: [phone]   Fax:  [phone]  Name: BARABARA MOTZ MRN: [id number] Date of Birth: [birth year]

## 2021-01-13 ENCOUNTER — Other Ambulatory Visit: Payer: Self-pay

## 2021-01-13 ENCOUNTER — Encounter: Payer: Self-pay | Admitting: Physical Therapy

## 2021-01-13 ENCOUNTER — Ambulatory Visit: Payer: Medicare Other | Admitting: Physical Therapy

## 2021-01-13 DIAGNOSIS — R293 Abnormal posture: Secondary | ICD-10-CM

## 2021-01-13 DIAGNOSIS — R262 Difficulty in walking, not elsewhere classified: Secondary | ICD-10-CM

## 2021-01-13 DIAGNOSIS — M6281 Muscle weakness (generalized): Secondary | ICD-10-CM

## 2021-01-13 DIAGNOSIS — G8929 Other chronic pain: Secondary | ICD-10-CM

## 2021-01-13 DIAGNOSIS — M5442 Lumbago with sciatica, left side: Secondary | ICD-10-CM | POA: Diagnosis not present

## 2021-01-13 NOTE — Therapy (Addendum)
[place], [place], [address] Phone: [phone]   Fax:  [phone]  PHYSICAL THERAPY UNPLANNED DISCHARGE SUMMARY   Visits from Start of Care: 9  Current functional level related to goals / functional outcomes: Goals met   Remaining deficits: Pt has not returned since visit listed below   Education / Equipment: Pt has not returned since visit listed below  Goals met.  Patient is being discharged due to not returning since the last visit and goals being met.  (the below note was addended to include the above D/C summary on 03/08/21)   Physical Therapy Treatment  Patient Details  Name: Susan Peck MRN: [id number] Date of Birth: Jul 01, 1966 Referring Provider (PT): Lucious Groves, DO   Encounter Date: 01/13/2021   PT End of Session - 01/13/21 1752     Visit Number 9    Number of Visits 16    Date for PT Re-Evaluation 01/27/21    Authorization Type UHC Medicare    Authorization Time Period Foto Visit 6 and 10    Progress Note Due on Visit 10    PT Start Time 0551    PT Stop Time 0630    PT Time Calculation (min) 39 min    Activity Tolerance Patient tolerated treatment well    Behavior During Therapy [facility] for tasks assessed/performed             Past Medical History:  Diagnosis Date   AICD (automatic cardioverter/defibrillator) present 12/13/2016   Anxiety    no meds   Asthma    CHF (congestive heart failure) ([place])    Depression    no meds   Diabetes mellitus    recent dx 11/17/11 - started med 11/18/11 type 2   GERD (gastroesophageal reflux disease)    Goiter 07/27/2011   non-neoplastic goiter - fine needle aspiration - benign sees dr Dwyane Dee for   Heart murmur    dx 2 yrs ago per pt   Herpes genitalis in women    Hypertension    IBS (irritable bowel syndrome)    tx with diet per pt   Low back pain    history   Obesity    Seasonal allergies    Shortness of breath     occasional - exercise induced   Sleep apnea    cpap broken   Systolic heart failure    May 2011 EF 35-40%, 04/03/11 EF 25-30%, 06/27/11 EF 30-35%    Past Surgical History:  Procedure Laterality Date   ABDOMINAL HYSTERECTOMY     BIV ICD INSERTION CRT-D N/A 12/13/2016   Procedure: BiV ICD Insertion CRT-D;  Surgeon: Deboraha Sprang, MD;  Location: [place] [facility];  Service: Cardiovascular;  Laterality: N/A;   cardiac catherization  2004   [place], [place] - Dr Lynnell Jude   CARDIAC CATHETERIZATION     CARDIAC CATHETERIZATION N/A 09/15/2015   Procedure: Right/Left Heart Cath and Coronary Angiography;  Surgeon: Jolaine Artist, MD;  Location: [place] [facility];  Service: Cardiovascular;  Laterality: N/A;   COLONOSCOPY WITH PROPOFOL N/A 03/26/2015   Procedure: COLONOSCOPY WITH PROPOFOL;  Surgeon: Carol Ada, MD;  [facility];  Service: Endoscopy;  Laterality: N/A;   colonscopy     CYSTOSCOPY  11/23/2011   Procedure: CYSTOSCOPY;  Surgeon: Jolayne Haines, MD;  Location: [facility];  Service: Gynecology;  Laterality: N/A;   DIAGNOSTIC LAPAROSCOPY     of pelvis   DILATION AND CURETTAGE  OF UTERUS  12/2006,  10/2005   hysteroscopy surgery x 2   ESOPHAGOGASTRODUODENOSCOPY (EGD) WITH PROPOFOL N/A 11/12/2020   Procedure: ESOPHAGOGASTRODUODENOSCOPY (EGD) WITH PROPOFOL;  Surgeon: Carol Ada, MD;  [facility];  Service: Endoscopy;  Laterality: N/A;   INSERTION OF ICD  12/13/2016   BIV   LAPAROSCOPIC ASSISTED VAGINAL HYSTERECTOMY  11/23/2011   Procedure: LAPAROSCOPIC ASSISTED VAGINAL HYSTERECTOMY;  Surgeon: Jolayne Haines, MD;  Location: [facility];  Service: Gynecology;  Laterality: N/A;   NOVASURE ABLATION     10/2005   SALPINGOOPHORECTOMY  11/23/2011   Procedure: SALPINGO OOPHERECTOMY;  Surgeon: Jolayne Haines, MD;  Location: [facility];  Service: Gynecology;  Laterality: Bilateral;   SAVORY DILATION N/A 11/12/2020   Procedure: SAVORY DILATION;  Surgeon: Carol Ada, MD;  [facility];  Service: Endoscopy;  Laterality: N/A;   SVD     x 3   TOOTH EXTRACTION N/A 04/05/2017   Procedure: DENTAL EXTRACTIONS OF TEETH FIVE, SEVEN, SIXTEEN, SEVENTEEN;  Surgeon: Diona Browner, DDS;  Location: [facility];  Service: Oral Surgery;  Laterality: N/A;   TUBAL LIGATION     WISDOM TOOTH EXTRACTION      There were no vitals filed for this visit.   Subjective Assessment - 01/13/21 1755     Subjective Pt reports that her back is doing very well.  She is not having any pain today "I haven't even noticed it"    Pertinent History has defibrillator    Limitations House hold activities;Lifting;Standing;Walking;Sitting    Diagnostic tests MRI Lumbar Spine Without Contrast:    IMPRESSION:  Progression of degenerative disease at L5-S1 where a large central  protrusion causes moderate to moderately severe central canal  stenosis and narrowing of both subarticular recesses with  encroachment on the S1 roots. There is also mild bilateral foraminal  narrowing at this level.     Shallow disc bulge and moderate facet arthropathy at L4-5 have  progressed somewhat since the prior exam. There is mild central  canal stenosis at L4-5.    Patient Stated Goals find a way to ease the pain to sleep better    Currently in Pain? No/denies                               [facility] Adult PT Treatment/Exercise - 01/13/21 0001       Lumbar Exercises: Aerobic   Nustep lvl 5 x 5 min while taking subjective UE/LE      Lumbar Exercises: Machines for Strengthening   Cybex Knee Extension 3x10 20#    Cybex Knee Flexion 3x10 15#    Leg Press 3x10 50lbs    Other Lumbar Machine Exercise hip abduction - 3x10 ea '@25' #      Lumbar Exercises: Standing   Other Standing Lumbar Exercises Pallof press at 2x10 13#    Other Standing Lumbar Exercises Lunge onto BOSU dome up 2x10ea      Lumbar Exercises: Quadruped   Other Quadruped Lumbar Exercises leg kick - 3x10                      PT Short  Term Goals - 01/04/21 1925       PT SHORT TERM GOAL #1   Title Pt will be independent and compliant with initial HEP.    Baseline provided at eval    Time 3    Period Weeks    Status Achieved  PT SHORT TERM GOAL #2   Title Pt will perform posterior pelvic tilt with good form and breathing for improved pelvic alignment and decreased tension on low back from hip flexors.    Baseline intermittently performed correctly during HEP prescription    Time 3    Period Weeks    Status Achieved                PT Long Term Goals - 01/04/21 1925       PT LONG TERM GOAL #5   Title Pt will increase FOTO ability to at least 66% ability, in order to demonstrate meaningful change in perceived level of functional ability.    Baseline 53% ability    Time 8    Period Weeks    Status Achieved   72%                  Plan - 01/13/21 1802     Clinical Impression Statement Susan Peck is progressing well with therapy.  Today we concentrated on core strengthening.  Pt is able to progress to full bird dog today.  She is allow able to progress intensity of pallof press.  Pt reports no increase in baseline pain following therapy.  Next session we will continue current course, progressing as able/appropriate.  HEP was not updated as current program meets patient's needs.  Pt will continue to benefit from skilled physical therapy to address remaining deficits and achieve listed goals.  Continue per POC.    Personal Factors and Comorbidities Time since onset of injury/illness/exacerbation;Fitness    Examination-Activity Limitations Stairs;Squat;Lift;Bend;Sleep;Locomotion Level;Transfers    Examination-Participation Restrictions Cleaning;Laundry;Community Activity;Interpersonal Relationship    PT Treatment/Interventions ADLs/Self Care Home Management;Aquatic Therapy;Cryotherapy;Moist Heat;Spinal Manipulations;Joint Manipulations;Dry needling;Passive range of motion;Taping;Manual  techniques;Patient/family education;Therapeutic exercise;Gait training;Stair training;Traction;Therapeutic activities;Functional mobility training;Balance training;Neuromuscular re-education    PT Next Visit Plan continue to progress core/hip strengthening; assess effectiveness of extension biased exercises to reduce radicular symptoms    PT Home Exercise Plan [id number]    Consulted and Agree with Plan of Care Patient             Patient will benefit from skilled therapeutic intervention in order to improve the following deficits and impairments:  Decreased balance, Decreased endurance, Difficulty walking, Obesity, Pain, Postural dysfunction, Impaired flexibility, Decreased strength, Decreased activity tolerance, Impaired perceived functional ability, Improper body mechanics, Decreased range of motion  Visit Diagnosis: Chronic bilateral low back pain with bilateral sciatica  Muscle weakness (generalized)  Abnormal posture  Difficulty in walking, not elsewhere classified     Problem List Patient Active Problem List   Diagnosis Date Noted   Dysphagia 10/06/2020   ICD (implantable cardioverter-defibrillator) in place-MDT 04/25/2020   Recent Coronavirus infection 09/01/2019   Insomnia 07/02/2018   Depression 07/02/2018   Restless leg syndrome 03/12/2018   Constipation 02/21/2018   Asthma 06/12/2017   Healthcare maintenance 06/06/2017   Right shoulder pain 06/05/2017   Chronic low back pain 04/03/2017   NICM (nonischemic cardiomyopathy) ([place]) 12/13/2016   LBBB (left bundle branch block) 12/13/2016   Chronic pansinusitis 09/20/2016   Laryngopharyngeal reflux (LPR) 09/20/2016   HLD (hyperlipidemia) 08/18/2016   History of colonic polyps 03/08/2016   Chronic hip pain 12/28/2015   Diabetes mellitus type II, uncontrolled ([place]) 10/05/2015   HTN (hypertension) 09/17/2014   Goiter 07/10/2011   Obstructive sleep apnea 17/51/0258   Chronic systolic heart failure ([place]) 04/10/2011     [name] Balo PT, DPT 01/13/21 6:28 PM   Outpatient  Rehabilitation [facility] [address] [place], [place], [address] Phone: [phone]   Fax:  [phone]  Name: Susan Peck MRN: [id number] Date of Birth: 11-26-1965

## 2021-01-21 ENCOUNTER — Encounter: Payer: Medicare Other | Admitting: Internal Medicine

## 2021-01-25 ENCOUNTER — Telehealth: Payer: Self-pay

## 2021-01-25 ENCOUNTER — Encounter: Payer: Self-pay | Admitting: *Deleted

## 2021-01-25 NOTE — Progress Notes (Signed)
Remote ICD transmission.   

## 2021-01-25 NOTE — Progress Notes (Signed)
Electrophysiology Office Note Date: 01/26/2021  ID:  Susan Peck, DOB 07-29-1965, MRN [id number]  PCP: Harvie Heck, MD Primary Cardiologist: None Electrophysiologist: Virl Axe, MD   CC: Routine ICD follow-up  Susan Peck is a 55 y.o. female seen today for Virl Axe, MD for routine electrophysiology followup.  Since last being seen in our clinic the patient reports doing well overall. Graduated from HF clinic 10/2020.    she denies chest pain, palpitations, dyspnea, PND, orthopnea, nausea, vomiting, dizziness, syncope, edema, weight gain, or early satiety. He has not had ICD shocks.  Continues to have intermittent muscle pains in her upper chest. Work up has been unrevealing. She occasionally has stim if lying on her L side for prolonged periods, this is a different sensation.   Device History: Medtronic BiV ICD implanted 2018 for primary prevention of NICM with interval normalizatoin of LV function   Past Medical History:  Diagnosis Date   AICD (automatic cardioverter/defibrillator) present 12/13/2016   Anxiety    no meds   Asthma    CHF (congestive heart failure) ([place])    Depression    no meds   Diabetes mellitus    recent dx 11/17/11 - started med 11/18/11 type 2   GERD (gastroesophageal reflux disease)    Goiter 07/27/2011   non-neoplastic goiter - fine needle aspiration - benign sees dr Dwyane Dee for   Heart murmur    dx 2 yrs ago per pt   Herpes genitalis in women    Hypertension    IBS (irritable bowel syndrome)    tx with diet per pt   Low back pain    history   Obesity    Seasonal allergies    Shortness of breath    occasional - exercise induced   Sleep apnea    cpap broken   Systolic heart failure    May 2011 EF 35-40%, 04/03/11 EF 25-30%, 06/27/11 EF 30-35%   Past Surgical History:  Procedure Laterality Date   ABDOMINAL HYSTERECTOMY     BIV ICD INSERTION CRT-D N/A 12/13/2016   Procedure: BiV ICD Insertion CRT-D;  Surgeon: Deboraha Sprang, MD;   Location: [facility];  Service: Cardiovascular;  Laterality: N/A;   cardiac catherization  2004   [place], [place] - Dr Lynnell Jude   CARDIAC CATHETERIZATION     CARDIAC CATHETERIZATION N/A 09/15/2015   Procedure: Right/Left Heart Cath and Coronary Angiography;  Surgeon: Jolaine Artist, MD;  Location: [facility];  Service: Cardiovascular;  Laterality: N/A;   COLONOSCOPY WITH PROPOFOL N/A 03/26/2015   Procedure: COLONOSCOPY WITH PROPOFOL;  Surgeon: Carol Ada, MD;  [facility];  Service: Endoscopy;  Laterality: N/A;   colonscopy     CYSTOSCOPY  11/23/2011   Procedure: CYSTOSCOPY;  Surgeon: Jolayne Haines, MD;  Location: [facility];  Service: Gynecology;  Laterality: N/A;   DIAGNOSTIC LAPAROSCOPY     of pelvis   DILATION AND CURETTAGE OF UTERUS  12/2006,  10/2005   hysteroscopy surgery x 2   ESOPHAGOGASTRODUODENOSCOPY (EGD) WITH PROPOFOL N/A 11/12/2020   Procedure: ESOPHAGOGASTRODUODENOSCOPY (EGD) WITH PROPOFOL;  Surgeon: Carol Ada, MD;  [facility];  Service: Endoscopy;  Laterality: N/A;   INSERTION OF ICD  12/13/2016   BIV   LAPAROSCOPIC ASSISTED VAGINAL HYSTERECTOMY  11/23/2011   Procedure: LAPAROSCOPIC ASSISTED VAGINAL HYSTERECTOMY;  Surgeon: Jolayne Haines, MD;  Location: [facility];  Service: Gynecology;  Laterality: N/A;   NOVASURE ABLATION     10/2005  SALPINGOOPHORECTOMY  11/23/2011   Procedure: SALPINGO OOPHERECTOMY;  Surgeon: Jolayne Haines, MD;  Location: [facility];  Service: Gynecology;  Laterality: Bilateral;   SAVORY DILATION N/A 11/12/2020   Procedure: SAVORY DILATION;  Surgeon: Carol Ada, MD;  [facility];  Service: Endoscopy;  Laterality: N/A;   SVD     x 3   TOOTH EXTRACTION N/A 04/05/2017   Procedure: DENTAL EXTRACTIONS OF TEETH FIVE, SEVEN, SIXTEEN, SEVENTEEN;  Surgeon: Diona Browner, DDS;  Location: [facility];  Service: Oral Surgery;  Laterality: N/A;   TUBAL LIGATION     WISDOM TOOTH EXTRACTION      Current Outpatient Medications   Medication Sig Dispense Refill   aspirin 81 MG chewable tablet Chew 81 mg by mouth daily.      Aspirin-Acetaminophen-Caffeine (GOODY HEADACHE PO) Take 1 packet by mouth daily as needed (pain).     aspirin-sod bicarb-citric acid (ALKA-SELTZER) 325 MG TBEF tablet Take 650 mg by mouth every 6 (six) hours as needed (indigestion).     atorvastatin (LIPITOR) 40 MG tablet TAKE 1 TABLET BY MOUTH EVERY DAY 90 tablet 3   BD PEN NEEDLE NANO 2ND GEN 32G X 4 MM MISC USE TO INJECT XULTOPHY DAILY 100 each 5   calcium carbonate (TUMS - DOSED IN MG ELEMENTAL CALCIUM) 500 MG chewable tablet Chew 2,000 mg by mouth daily as needed for indigestion or heartburn.     carvedilol (COREG) 25 MG tablet TAKE 1 TABLET (25 MG TOTAL) BY MOUTH 2 (TWO) TIMES DAILY WITH A MEAL. 180 tablet 3   diclofenac Sodium (VOLTAREN) 1 % GEL APPLY 2 GRAMS TO AFFECTED AREA 4 TIMES A DAY 100 g 1   ENTRESTO 97-103 MG TAKE 1 TABLET BY MOUTH TWICE A DAY 60 tablet 11   eplerenone (INSPRA) 25 MG tablet Take 25 mg by mouth daily.     fluticasone (FLONASE) 50 MCG/ACT nasal spray PLACE 1 SPRAY INTO BOTH NOSTRILS 2 (TWO) TIMES DAILY 48 mL 1   furosemide (LASIX) 40 MG tablet TAKE 2 TABS EVERY MORNING AND 1 TAB EVERY EVENING. 270 tablet 0   glucose blood (ONETOUCH VERIO) test strip Insulin dependent. Check blood sugar up to 3 times daily. diag code E11.65 100 each 12   Lancet Devices (ONETOUCH DELICA PLUS LANCING) MISC USE AS DIRECTED TO CHECK BLOOD SUGAR 1 each 2   linaclotide (LINZESS) 145 MCG CAPS capsule Take 145 mcg by mouth daily before breakfast.     methocarbamol (ROBAXIN) 500 MG tablet Take 1 tablet (500 mg total) by mouth 3 (three) times daily as needed for muscle spasms. 60 tablet 0   mupirocin cream (BACTROBAN) 2 % APPLY TO AFFECTED AREA TWICE A DAY 15 g 1   ONETOUCH DELICA LANCETS FINE MISC Insulin dependent. Check blood sugar up to 3 times daily. diag code E11.65 100 each 12   PATADAY 0.2 % SOLN Place 1 drop into both eyes daily.  4    polyethylene glycol (MIRALAX / GLYCOLAX) 17 g packet Take 17 g by mouth daily as needed.     senna-docusate (SENOKOT-S) 8.6-50 MG tablet Take 1 tablet by mouth daily. 30 tablet 0   VYZULTA 0.024 % SOLN Place 1 drop into both eyes at bedtime.     XULTOPHY 100-3.6 UNIT-MG/ML SOPN INJECT 12 UNITS/DAY INTO THE SKIN DAILY IN THE AFTERNOON. 3 mL 11   No current facility-administered medications for this visit.    Allergies:   Shellfish-derived products   Social History: Social History   Socioeconomic  History   Marital status: Divorced    Spouse name: Not on file   Number of children: Y   Years of education: Not on file   Highest education level: Not on file  Occupational History   Occupation: Surveyor, quantity: [employer]    Comment: and Systems analyst  Tobacco Use   Smoking status: Never   Smokeless tobacco: Never  Vaping Use   Vaping Use: Never used  Substance and Sexual Activity   Alcohol use: Not Currently    Alcohol/week: 0.0 standard drinks   Drug use: No   Sexual activity: Not Currently    Birth control/protection: Surgical    Comment: des neg, intercourse age 54, more than sexual parterns  Other Topics Concern   Not on file  Social History Narrative   She lives with her husband and son.  She is works for [employer].   Social Determinants of Health   Financial Resource Strain: Not on file  Food Insecurity: Not on file  Transportation Needs: Not on file  Physical Activity: Not on file  Stress: Not on file  Social Connections: Not on file  Intimate Partner Violence: Not on file    Family History: Family History  Problem Relation Age of Onset   Heart disease Maternal Aunt    Breast cancer Mother    Thyroid disease Mother    Hypertension Maternal Grandmother    Diabetes Maternal Grandmother    Breast cancer Maternal Aunt    Heart disease Maternal Aunt    Diabetes Maternal Grandfather    Diabetes Paternal Grandmother    Diabetes Paternal Grandfather     Hypertension Paternal Grandfather     Review of Systems: All other systems reviewed and are otherwise negative except as noted above.   Physical Exam: Vitals:   01/26/21 1137  BP: 116/76  Pulse: 88  SpO2: 97%  Weight: 195 lb 9.6 oz (88.7 kg)  Height: 5\' 3"  (1.6 m)     GEN- The patient is well appearing, alert and oriented x 3 today.   HEENT: normocephalic, atraumatic; sclera clear, conjunctiva pink; hearing intact; oropharynx clear; neck supple, no JVP Lymph- no cervical lymphadenopathy Lungs- Clear to ausculation bilaterally, normal work of breathing.  No wheezes, rales, rhonchi Heart- Regular rate and rhythm, no murmurs, rubs or gallops, PMI not laterally displaced GI- soft, non-tender, non-distended, bowel sounds present, no hepatosplenomegaly Extremities- no clubbing or cyanosis. No edema; DP/PT/radial pulses 2+ bilaterally MS- no significant deformity or atrophy Skin- warm and dry, no rash or lesion; ICD pocket well healed Psych- euthymic mood, full affect Neuro- strength and sensation are intact  ICD interrogation- reviewed in detail today,  See PACEART report  EKG:  EKG is not ordered today.  Recent Labs: No results found for requested labs within last 8760 hours.   Wt Readings from Last 3 Encounters:  01/26/21 195 lb 9.6 oz (88.7 kg)  11/12/20 198 lb (89.8 kg)  11/05/20 198 lb 12.8 oz (90.2 kg)     Other studies Reviewed: Additional studies/ records that were reviewed today include: Previous EP office notes.   Assessment and Plan:  1.  Chronic systolic dysfunction s/p Medtronic CRT-D  EF has recovered completely. Echo 12/2019 LVEF 55-60% euvolemic today Stable on an appropriate medical regimen. Recently graduated from HF clinic Normal ICD function See Claudia Desanctis Art report No changes today  2. HTN Stable on current regimen.  3. H/o diaphragmatic stim At higher voltages programmed L1-L3.  She is overall stable.  4. Left chest pain At times history has  been concerning for device pocket infection, but overall non specific pain with radiation to back and right chest. Follow clinically. Cath in 2017 with normal cors.  Current medicines are reviewed at length with the patient today.   The patient does not have concerns regarding her medicines.  The following changes were made today:  none  Labs/ tests ordered today include:  Orders Placed This Encounter  Procedures   Basic metabolic panel   CUP [place]    Disposition:   Follow up with EP APP  6 months   Signed, Shirley Friar, PA-C  01/26/2021 1:34 PM  [place] [phone] [phone] (office) [phone] (fax)

## 2021-01-25 NOTE — Telephone Encounter (Signed)
Returned patient call from voice mail message left after hours on Friday 7/1.  Pt said she missed EP appointment this past week and wanted to rescheduled.  She did get an appointment with Oda Kilts, PA on 7/6 since Dr Caryl Comes was booked until October.  She appreciated call back.

## 2021-01-26 ENCOUNTER — Ambulatory Visit (INDEPENDENT_AMBULATORY_CARE_PROVIDER_SITE_OTHER): Payer: Medicare Other | Admitting: Student

## 2021-01-26 ENCOUNTER — Other Ambulatory Visit: Payer: Self-pay

## 2021-01-26 ENCOUNTER — Encounter: Payer: Self-pay | Admitting: Student

## 2021-01-26 VITALS — BP 116/76 | HR 88 | Ht 63.0 in | Wt 195.6 lb

## 2021-01-26 DIAGNOSIS — I5022 Chronic systolic (congestive) heart failure: Secondary | ICD-10-CM

## 2021-01-26 DIAGNOSIS — I428 Other cardiomyopathies: Secondary | ICD-10-CM

## 2021-01-26 DIAGNOSIS — I447 Left bundle-branch block, unspecified: Secondary | ICD-10-CM | POA: Diagnosis not present

## 2021-01-26 DIAGNOSIS — Z95810 Presence of automatic (implantable) cardiac defibrillator: Secondary | ICD-10-CM

## 2021-01-26 LAB — CUP PACEART INCLINIC DEVICE CHECK
Battery Remaining Longevity: 30 mo
Battery Voltage: 2.95 V
Brady Statistic AP VP Percent: 0.25 %
Brady Statistic AP VS Percent: 0.01 %
Brady Statistic AS VP Percent: 99.63 %
Brady Statistic AS VS Percent: 0.11 %
Brady Statistic RA Percent Paced: 0.26 %
Brady Statistic RV Percent Paced: 99.73 %
Date Time Interrogation Session: 20220706130940
HighPow Impedance: 76 Ohm
Implantable Lead Implant Date: 20180524
Implantable Lead Implant Date: 20180524
Implantable Lead Implant Date: 20180524
Implantable Lead Location: 753858
Implantable Lead Location: 753859
Implantable Lead Location: 753860
Implantable Lead Model: 4398
Implantable Lead Model: 5076
Implantable Pulse Generator Implant Date: 20180524
Lead Channel Impedance Value: 172.541
Lead Channel Impedance Value: 172.541
Lead Channel Impedance Value: 176 Ohm
Lead Channel Impedance Value: 176 Ohm
Lead Channel Impedance Value: 204.14 Ohm
Lead Channel Impedance Value: 304 Ohm
Lead Channel Impedance Value: 304 Ohm
Lead Channel Impedance Value: 361 Ohm
Lead Channel Impedance Value: 399 Ohm
Lead Channel Impedance Value: 418 Ohm
Lead Channel Impedance Value: 456 Ohm
Lead Channel Impedance Value: 532 Ohm
Lead Channel Impedance Value: 608 Ohm
Lead Channel Impedance Value: 608 Ohm
Lead Channel Impedance Value: 627 Ohm
Lead Channel Impedance Value: 627 Ohm
Lead Channel Impedance Value: 627 Ohm
Lead Channel Impedance Value: 684 Ohm
Lead Channel Pacing Threshold Amplitude: 0.375 V
Lead Channel Pacing Threshold Amplitude: 0.5 V
Lead Channel Pacing Threshold Amplitude: 0.625 V
Lead Channel Pacing Threshold Pulse Width: 0.4 ms
Lead Channel Pacing Threshold Pulse Width: 0.4 ms
Lead Channel Pacing Threshold Pulse Width: 1 ms
Lead Channel Sensing Intrinsic Amplitude: 26 mV
Lead Channel Sensing Intrinsic Amplitude: 3.25 mV
Lead Channel Sensing Intrinsic Amplitude: 3.25 mV
Lead Channel Sensing Intrinsic Amplitude: 31.625 mV
Lead Channel Setting Pacing Amplitude: 0.75 V
Lead Channel Setting Pacing Amplitude: 1.5 V
Lead Channel Setting Pacing Amplitude: 2.5 V
Lead Channel Setting Pacing Pulse Width: 0.4 ms
Lead Channel Setting Pacing Pulse Width: 1 ms
Lead Channel Setting Sensing Sensitivity: 0.3 mV

## 2021-01-26 LAB — BASIC METABOLIC PANEL
BUN/Creatinine Ratio: 24 — ABNORMAL HIGH (ref 9–23)
BUN: 14 mg/dL (ref 6–24)
CO2: 24 mmol/L (ref 20–29)
Calcium: 9.7 mg/dL (ref 8.7–10.2)
Chloride: 100 mmol/L (ref 96–106)
Creatinine, Ser: 0.59 mg/dL (ref 0.57–1.00)
Glucose: 172 mg/dL — ABNORMAL HIGH (ref 65–99)
Potassium: 4.2 mmol/L (ref 3.5–5.2)
Sodium: 141 mmol/L (ref 134–144)
eGFR: 106 mL/min/{1.73_m2} (ref >59)

## 2021-01-26 NOTE — Patient Instructions (Signed)
Medication Instructions:  Your physician recommends that you continue on your current medications as directed. Please refer to the Current Medication list given to you today.  *If you need a refill on your cardiac medications before your next appointment, please call your pharmacy*   Lab Work: TODAY: BMET  If you have labs (blood work) drawn today and your tests are completely normal, you will receive your results only by: [place] (if you have MyChart) OR A paper copy in the mail If you have any lab test that is abnormal or we need to change your treatment, we will call you to review the results.   Follow-Up: At [facility], you and your health needs are our priority.  As part of our continuing mission to provide you with exceptional heart care, we have created designated Provider Care Teams.  These Care Teams include your primary Cardiologist (physician) and Advanced Practice Providers (APPs -  Physician Assistants and Nurse Practitioners) who all work together to provide you with the care you need, when you need it.  Your next appointment:   6 month(s)  The format for your next appointment:   In Person  Provider:   You may see Virl Axe, MD or one of the following Advanced Practice Providers on your designated Care Team:   Tommye Standard, [place] "[facility]" [place], [place]

## 2021-01-27 NOTE — Telephone Encounter (Signed)
LM for pt to return my call to discuss lab results.

## 2021-02-02 ENCOUNTER — Telehealth: Payer: Self-pay | Admitting: Internal Medicine

## 2021-02-02 NOTE — Telephone Encounter (Signed)
New message:     Patient calling to get results.

## 2021-02-02 NOTE — Telephone Encounter (Signed)
The patient has been notified of the result and verbalized understanding.  All questions (if any) were answered. Antonieta Iba, RN 02/02/2021 8:37 AM

## 2021-02-14 NOTE — Progress Notes (Signed)
No ICM remote transmission received for 02/14/2021 and next ICM transmission scheduled for 02/28/2021.

## 2021-02-22 ENCOUNTER — Other Ambulatory Visit: Payer: Self-pay | Admitting: Internal Medicine

## 2021-02-22 DIAGNOSIS — S81801A Unspecified open wound, right lower leg, initial encounter: Secondary | ICD-10-CM

## 2021-02-26 ENCOUNTER — Other Ambulatory Visit: Payer: Self-pay | Admitting: Student

## 2021-02-26 DIAGNOSIS — K5900 Constipation, unspecified: Secondary | ICD-10-CM

## 2021-02-28 ENCOUNTER — Ambulatory Visit (INDEPENDENT_AMBULATORY_CARE_PROVIDER_SITE_OTHER): Payer: Medicare Other

## 2021-02-28 DIAGNOSIS — Z95810 Presence of automatic (implantable) cardiac defibrillator: Secondary | ICD-10-CM | POA: Diagnosis not present

## 2021-02-28 DIAGNOSIS — I5022 Chronic systolic (congestive) heart failure: Secondary | ICD-10-CM | POA: Diagnosis not present

## 2021-03-01 ENCOUNTER — Telehealth: Payer: Self-pay

## 2021-03-01 NOTE — Telephone Encounter (Signed)
Remote ICM transmission received.  Attempted call to patient regarding ICM remote transmission and left detailed message per DPR.  Advised to return call for any fluid symptoms or questions. Next ICM remote transmission scheduled 04/05/2021.

## 2021-03-01 NOTE — Progress Notes (Signed)
EPIC Encounter for ICM Monitoring  Patient Name: Susan Peck is a 55 y.o. female Date: 03/01/2021 Primary Care Physican: Harvie Heck, MD Primary Cardiologist: Caryl Comes (Bensimhon as needed) Electrophysiologist: Vergie Living Pacing: 99.7%  01/07/2021 Weight: 195 lbs     Attempted call to patient and unable to reach.  Left detailed message per DPR regarding transmission. Transmission reviewed.    Optivol Thoracic impedance normal.     Prescribed:  Furosemide 40 mg take 2 tablets (80 mg total) every morning and 1 tablet (40 mg total) every evening.   Labs: 12/29/2019 Creatinine 0.55, BUN 11, Potassium 3.6, Sodium 141, GFR >60 A complete set of results can be found in Results Review.   Recommendations:  Left voice mail with ICM number and encouraged to call if experiencing any fluid symptoms.   Follow-up plan: ICM clinic phone appointment on 04/05/2021.   91 day device clinic remote transmission 04/04/2021.     EP/Cardiology Office Visits:   Recall 04/27/2021 with Dr Caryl Comes.   Copy of ICM check sent to Dr. Caryl Comes.    3 month ICM trend: 02/28/2021.    1 Year ICM trend:       [name], RN 03/01/2021 4:12 PM

## 2021-03-08 ENCOUNTER — Other Ambulatory Visit (HOSPITAL_COMMUNITY): Payer: Self-pay | Admitting: Cardiology

## 2021-03-17 ENCOUNTER — Other Ambulatory Visit: Payer: Self-pay | Admitting: Student

## 2021-04-04 ENCOUNTER — Ambulatory Visit (INDEPENDENT_AMBULATORY_CARE_PROVIDER_SITE_OTHER): Payer: Medicare Other

## 2021-04-04 DIAGNOSIS — I428 Other cardiomyopathies: Secondary | ICD-10-CM

## 2021-04-05 ENCOUNTER — Ambulatory Visit (INDEPENDENT_AMBULATORY_CARE_PROVIDER_SITE_OTHER): Payer: Medicare Other

## 2021-04-05 DIAGNOSIS — Z95810 Presence of automatic (implantable) cardiac defibrillator: Secondary | ICD-10-CM | POA: Diagnosis not present

## 2021-04-05 DIAGNOSIS — I5022 Chronic systolic (congestive) heart failure: Secondary | ICD-10-CM | POA: Diagnosis not present

## 2021-04-06 LAB — CUP PACEART REMOTE DEVICE CHECK
Battery Remaining Longevity: 29 mo
Battery Voltage: 2.93 V
Brady Statistic AP VP Percent: 0.15 %
Brady Statistic AP VS Percent: 0.01 %
Brady Statistic AS VP Percent: 99.61 %
Brady Statistic AS VS Percent: 0.23 %
Brady Statistic RA Percent Paced: 0.16 %
Brady Statistic RV Percent Paced: 99.52 %
Date Time Interrogation Session: 20220913174334
HighPow Impedance: 70 Ohm
Implantable Lead Implant Date: 20180524
Implantable Lead Implant Date: 20180524
Implantable Lead Implant Date: 20180524
Implantable Lead Location: 753858
Implantable Lead Location: 753859
Implantable Lead Location: 753860
Implantable Lead Model: 4398
Implantable Lead Model: 5076
Implantable Pulse Generator Implant Date: 20180524
Lead Channel Impedance Value: 170.472
Lead Channel Impedance Value: 170.472
Lead Channel Impedance Value: 182.205
Lead Channel Impedance Value: 182.205
Lead Channel Impedance Value: 193.707
Lead Channel Impedance Value: 323 Ohm
Lead Channel Impedance Value: 323 Ohm
Lead Channel Impedance Value: 361 Ohm
Lead Channel Impedance Value: 399 Ohm
Lead Channel Impedance Value: 418 Ohm
Lead Channel Impedance Value: 418 Ohm
Lead Channel Impedance Value: 456 Ohm
Lead Channel Impedance Value: 570 Ohm
Lead Channel Impedance Value: 608 Ohm
Lead Channel Impedance Value: 627 Ohm
Lead Channel Impedance Value: 627 Ohm
Lead Channel Impedance Value: 665 Ohm
Lead Channel Impedance Value: 722 Ohm
Lead Channel Pacing Threshold Amplitude: 0.375 V
Lead Channel Pacing Threshold Amplitude: 0.5 V
Lead Channel Pacing Threshold Amplitude: 0.5 V
Lead Channel Pacing Threshold Pulse Width: 0.4 ms
Lead Channel Pacing Threshold Pulse Width: 0.4 ms
Lead Channel Pacing Threshold Pulse Width: 1 ms
Lead Channel Sensing Intrinsic Amplitude: 23.125 mV
Lead Channel Sensing Intrinsic Amplitude: 23.125 mV
Lead Channel Sensing Intrinsic Amplitude: 4 mV
Lead Channel Sensing Intrinsic Amplitude: 4 mV
Lead Channel Setting Pacing Amplitude: 0.75 V
Lead Channel Setting Pacing Amplitude: 1.5 V
Lead Channel Setting Pacing Amplitude: 2.5 V
Lead Channel Setting Pacing Pulse Width: 0.4 ms
Lead Channel Setting Pacing Pulse Width: 1 ms
Lead Channel Setting Sensing Sensitivity: 0.3 mV

## 2021-04-06 NOTE — Progress Notes (Signed)
EPIC Encounter for ICM Monitoring  Patient Name: Susan Peck is a 55 y.o. female Date: 04/06/2021 Primary Care Physican: Harvie Heck, MD Primary Cardiologist: Caryl Comes (Bensimhon as needed) Electrophysiologist: Vergie Living Pacing: 100%  01/07/2021 Weight: 195 lbs     Spoke with patient and heart failure questions reviewed.  Pt asymptomatic for fluid accumulation and did have some pain around the device area last night but has resolved today.  She said the pain moves around some and will follow up with primary care.    Optivol Thoracic impedance suggesting normal fluid levels.     Prescribed:  Furosemide 40 mg take 2 tablets (80 mg total) every morning and 1 tablet (40 mg total) every evening.   Labs: 01/26/2021 Creatinine 0.59, Potassium 4.2, Sodium 141, GFR 106 A complete set of results can be found in Results Review.   Recommendations:  No changes and encouraged to call if experiencing any fluid symptoms.   Follow-up plan: ICM clinic phone appointment on 05/09/2021.   91 day device clinic remote transmission 07/04/2021.     EP/Cardiology Office Visits:   08/02/2021 with Dr Caryl Comes.   Copy of ICM check sent to Dr. Caryl Comes.     3 month ICM trend: 04/05/2021.    1 Year ICM trend:       [name], RN 04/06/2021 1:04 PM

## 2021-04-11 ENCOUNTER — Ambulatory Visit (INDEPENDENT_AMBULATORY_CARE_PROVIDER_SITE_OTHER): Payer: Medicare Other | Admitting: Internal Medicine

## 2021-04-11 VITALS — BP 166/92 | HR 76 | Temp 98.0°F | Ht 63.0 in | Wt 200.9 lb

## 2021-04-11 DIAGNOSIS — G8929 Other chronic pain: Secondary | ICD-10-CM

## 2021-04-11 DIAGNOSIS — M545 Low back pain, unspecified: Secondary | ICD-10-CM | POA: Diagnosis not present

## 2021-04-11 DIAGNOSIS — R1012 Left upper quadrant pain: Secondary | ICD-10-CM

## 2021-04-11 MED ORDER — DULOXETINE HCL 30 MG PO CPEP: ORAL_CAPSULE | ORAL | 2 refills | Status: DC

## 2021-04-11 NOTE — Progress Notes (Signed)
Internal Medicine Clinic Attending ° °I saw and evaluated the patient.  I personally confirmed the key portions of the history and exam documented by Dr. Atway and I reviewed pertinent patient test results.  The assessment, diagnosis, and plan were formulated together and I agree with the documentation in the resident’s note.  °

## 2021-04-11 NOTE — Assessment & Plan Note (Signed)
Patient notes that she has been having ongoing abdominal pain and cramping that is localized to her left upper quadrant. This has been going on for the past ~6 months, and is worse when the patient stretches her body. She notes that when she does this, she feels there is a knot in her abdomen, that seems to get stuck in her rib cage. She denies any fevers, chills, or n/v/d. The patient states that she has a remote history of a hernia on this side, but she never had surgery on it, because it was so small and was not bothersome to her.  On exam, abdomen is soft, nontender, and no mass/bulge is felt when the patient bears down. Discussed that a hernia is still possible, but we will need further imaging to evaluate this.  Plan: -CT abdomen without contrast

## 2021-04-11 NOTE — Assessment & Plan Note (Addendum)
Patient presented today to discuss her chronic low back pain and bilateral leg pain. She states that her pain has been worsening over the past year and she describes it as a burning sensation that goes from her back down to her toes. She denies any fevers, chills, recent injury/trauma, or IV drug use. Patient does have spinal tenderness in her lumbar region and had an MRI in April, which showed degeneration in her lumbar spine, as well as a bulging disc at L4-5 and spinal stenosis at this level as well. She was previously taking Lyrica for pain control, however, she states that this did not help improve her pain. Patient has also been going to physical therapy, which has helped her back pain, however, it has not helped her leg pain.  Plan: - Information on aquatic therapy at [facility] provided - Start Cymbalta 30mg  x2 weeks, followed by 60mg  thereafter for chronic pain management - Advised to follow up in 1 month for low back pain and other chronic issues with PCP

## 2021-04-11 NOTE — Patient Instructions (Signed)
Thank you, Ms.Rubina B Archibald for allowing [place] to provide your care today. Today we discussed: Abdominal pain: Ordered CT scan to evaluate for possible hernia Back pain: Start Cymbalta (duloxetine) '30mg'$  daily for 2 weeks, followed by 60 mg (2 tablets) everyday thereafter. Info for aquatic therapy below:  https://www.[place]-.gov/departments/parks-recreation/active-adults-50/aquatics  Water Arthritis Class Ross Marcus, Instructor Monday, Wednesday, Friday - 11-11:45 am September 12 to October 21 - $90 October 31 to December 14 - $90 (No class Nov. 11 & 25)  Water Aerobics - Debria Garret, Instructor Tuesday and Thursday - 9-9:45 am September 13 to October 20 - $60 November 1 to December 13 - $60 (No class Nov. 24)  Tuesday and Thursday - 10-10:45 am July 26 to September 1 - $60 September 13 to October 20 - $60 November 1 to December 13 - $60 (No class Nov. 24)  [place] Orvis Brill, Instructor Tuesday and Thursday - 5:45-6:30 pm (Ages 18+) September 13 to October 20 - $60 November 1 to December 13 - $60 (No class Nov. 24)  I have ordered the following labs for you:  Lab Orders  No laboratory test(s) ordered today     Tests ordered today:  CT abdomen  Referrals ordered today:   Referral Orders  No referral(s) requested today     I have ordered the following medication/changed the following medications:   Stop the following medications: There are no discontinued medications.   Start the following medications: Meds ordered this encounter  Medications   DULoxetine (CYMBALTA) 30 MG capsule    Sig: Take 1 capsule (30 mg total) by mouth daily for 14 days, THEN 2 capsules (60 mg total) daily.    Dispense:  30 capsule    Refill:  2     Follow up:  1 month      Should you have any questions or concerns please call the internal medicine clinic at [phone].     Buddy Duty, D.O. [place]

## 2021-04-11 NOTE — Progress Notes (Signed)
   CC: back pain  HPI:  [name] is a 55 y.o. female with HTN, HFrEF, HLD, DM, and chronic low back pain who presents to the [facility] for follow up of her back pain. Please see problem-based list for further details, assessments, and plans.   Past Medical History:  Diagnosis Date   AICD (automatic cardioverter/defibrillator) present 12/13/2016   Anxiety    no meds   Asthma    CHF (congestive heart failure) ([place])    Depression    no meds   Diabetes mellitus    recent dx 11/17/11 - started med 11/18/11 type 2   GERD (gastroesophageal reflux disease)    Goiter 07/27/2011   non-neoplastic goiter - fine needle aspiration - benign sees dr Dwyane Dee for   Heart murmur    dx 2 yrs ago per pt   Herpes genitalis in women    Hypertension    IBS (irritable bowel syndrome)    tx with diet per pt   Low back pain    history   Obesity    Seasonal allergies    Shortness of breath    occasional - exercise induced   Sleep apnea    cpap broken   Systolic heart failure    May 2011 EF 35-40%, 04/03/11 EF 25-30%, 06/27/11 EF 30-35%   Review of Systems:  Review of Systems  Constitutional:  Negative for chills and fever.  HENT: Negative.    Eyes:  Negative for blurred vision and double vision.  Respiratory:  Negative for cough and wheezing.   Cardiovascular:  Negative for chest pain, palpitations and PND.  Gastrointestinal:  Positive for abdominal pain. Negative for diarrhea, nausea and vomiting.  Genitourinary: Negative.   Musculoskeletal:  Positive for back pain and joint pain.  Neurological:  Negative for dizziness, tingling and headaches.    Physical Exam:  Vitals:   04/11/21 1507  BP: (!) 166/89  Pulse: 87  Temp: 98 F (36.7 C)  TempSrc: Oral  SpO2: 97%  Weight: 200 lb 14.4 oz (91.1 kg)  Height: '5\' 3"'$  (1.6 m)   General:  No acute distress. Head: Normocephalic. Atraumatic. CV: RRR. No murmurs, rubs, or gallops. No LE edema Pulmonary: Lungs CTAB. Normal effort. No wheezing  or rales. Abdominal: Soft, nontender, nondistended. Normal bowel sounds. No mass/bulge palpated Extremities/MSK: Palpable radial and DP pulses. Normal ROM. Lumbar bony spinal tenderness to palpation.  Skin: Warm and dry. No obvious rash or lesions. Neuro: A&Ox3. Moves all extremities, 5/5 strength bilateral upper and lower extremities. Normal sensation. No focal deficit. Psych: Normal mood and affect   Assessment & Plan:   See Encounters Tab for problem based charting.  Patient seen with Dr. Jimmye Norman

## 2021-04-13 NOTE — Progress Notes (Signed)
Remote ICD transmission.   

## 2021-04-21 NOTE — Addendum Note (Signed)
Addended by: Buddy Duty on: 04/21/2021 09:30 AM   Modules accepted: Orders

## 2021-05-02 ENCOUNTER — Telehealth: Payer: Self-pay

## 2021-05-02 NOTE — Telephone Encounter (Signed)
Recevied voice mail message from patient asking when she could drop off clearance form needed for a dental procedure.  She is a patient of Dr Olin Pia.  Left message stating she can drop off forms at front office anytime in the [place] office.  Advised to call [place] office for any further questions.

## 2021-05-04 ENCOUNTER — Ambulatory Visit (HOSPITAL_COMMUNITY)
Admission: RE | Admit: 2021-05-04 | Discharge: 2021-05-04 | Disposition: A | Discharge status: Home / Self Care | Payer: Medicare Other | Source: Ambulatory Visit | Attending: Internal Medicine | Admitting: Internal Medicine

## 2021-05-04 ENCOUNTER — Other Ambulatory Visit: Payer: Self-pay

## 2021-05-04 ENCOUNTER — Other Ambulatory Visit: Payer: Self-pay | Admitting: Internal Medicine

## 2021-05-04 DIAGNOSIS — R1012 Left upper quadrant pain: Secondary | ICD-10-CM | POA: Diagnosis not present

## 2021-05-06 ENCOUNTER — Encounter: Payer: Self-pay | Admitting: *Deleted

## 2021-05-06 ENCOUNTER — Encounter: Payer: Self-pay | Admitting: Internal Medicine

## 2021-05-06 NOTE — Progress Notes (Unsigned)

## 2021-05-09 ENCOUNTER — Ambulatory Visit (INDEPENDENT_AMBULATORY_CARE_PROVIDER_SITE_OTHER): Payer: Medicare Other

## 2021-05-09 DIAGNOSIS — Z95810 Presence of automatic (implantable) cardiac defibrillator: Secondary | ICD-10-CM

## 2021-05-09 DIAGNOSIS — I5022 Chronic systolic (congestive) heart failure: Secondary | ICD-10-CM | POA: Diagnosis not present

## 2021-05-12 ENCOUNTER — Telehealth: Payer: Self-pay | Admitting: Internal Medicine

## 2021-05-12 NOTE — Telephone Encounter (Signed)
Things That May Be Affecting Your Health:  Alcohol  Hearing loss  Pain   Y Depression  Home Safety  Sexual Health  Y Diabetes  Lack of physical activity  Stress   Difficulty with daily activities  Loneliness  Tiredness   Drug use Y Medicines  Tobacco use   Falls  Motor Vehicle Safety Y Weight  Y Food choices  Oral Health  Other    YOUR PERSONALIZED HEALTH PLAN : 1. Schedule your next subsequent Medicare Wellness visit in one year 2. Attend all of your regular appointments to address your medical issues 3. Complete the preventative screenings and services   Annual Wellness Visit   Medicare Covered Preventative Screenings and [place] Men and Women Who How Often Need? Date of Last Service Action  Abdominal Aortic Aneurysm Adults with AAA risk factors Once  @HMIMMADMINDATE ([id number])@    Alcohol Misuse and Counseling All Adults Screening once a year if no alcohol misuse. Counseling up to 4 face to face sessions.     Bone Density Measurement  Adults at risk for osteoporosis Once every 2 yrs  @HMIMMADMINDATE ([id number])@    Lipid Panel Z13.6 All adults without CV disease Once every 5 yrs Y @HMIMMADMINDATE ([id number])@     Colorectal Cancer  Stool sample or Colonoscopy All adults 33 and older  Once every year Every 10 years  @HMIMMADMINDATE ([id number])@ @HMIMMADMINDATE ([id number])@ @HMIMMADMINDATE ([id number])@    Depression All Adults Once a year Y Today   Diabetes Screening Blood glucose, post glucose load, or GTT Z13.1 All adults at risk Pre-diabetics Once per year Twice per year  @HMIMMADMINDATE ([id number])@    Diabetes  Self-Management Training All adults Diabetics 10 hrs first year; 2 hours subsequent years. Requires Copay Y    Glaucoma Diabetics Family history of glaucoma African Americans 41 yrs + Hispanic Americans 53 yrs + Annually - requires coppay Y @HMIMMADMINDATE ([phone])@    Hepatitis C Z72.89 or F19.20 High Risk for HCV Born  between 1945 and 1965 Annually Once      HIV Z11.4 All adults based on risk Annually btw ages 4 & 63 regardless of risk Annually > 65 yrs if at increased risk      Lung Cancer Screening Asymptomatic adults aged 61-77 with 30 pack yr history and current smoker OR quit within the last 15 yrs Annually Must have counseling and shared decision making documentation before first screen      Medical Nutrition Therapy Adults with  Diabetes Renal disease Kidney transplant within past 3 yrs 3 hours first year; 2 hours subsequent years Y    Obesity and Counseling All adults Screening once a year Counseling if BMI 30 or higher Y Today   Tobacco Use Counseling Adults who use tobacco  Up to 8 visits in one year     Vaccines Z23 Hepatitis B Influenza  Pneumonia  Adults  Once Once every flu season Two different vaccines separated by one year Y    Next Annual Wellness Visit People with Medicare Every year  Today     Services & Screenings Women Who How Often Need  Date of Last Service Action  Mammogram  Z12.31 Women over 58 One baseline ages 94-39. Annually ager 40 yrs+      Pap tests All women Annually if high risk. Every 2 yrs for normal risk women Y     Screening for cervical cancer with  Pap (Z01.419 nl or Z01.411abnl) & HPV Z11.51 Women aged 65 to 89 Once every 5 yrs  Screening pelvic and breast exams All women Annually if high risk. Every 2 yrs for normal risk women     Sexually Transmitted Diseases Chlamydia Gonorrhea Syphilis All at risk adults Annually for non pregnant females at increased risk         [place] Men Who How Ofter Need  Date of Last Service Action  Prostate Cancer - DRE & PSA Men over 50 Annually.  DRE might require a copay.        Sexually Transmitted Diseases Syphilis All at risk adults Annually for men at increased risk      Health Maintenance List Health Maintenance  Topic Date Due   COVID-19 Vaccine (1) Never done   Pneumococcal  Vaccine 37-62 Years old (1 - PCV) Never done   TETANUS/TDAP  Never done   PAP SMEAR-Modifier  01/30/2014   Zoster Vaccines- Shingrix (1 of 2) Never done   FOOT EXAM  07/03/2019   HEMOGLOBIN A1C  01/06/2021   INFLUENZA VACCINE  02/21/2021   OPHTHALMOLOGY EXAM  04/12/2021   MAMMOGRAM  12/02/2022   COLONOSCOPY (Pts 45-41yrs Insurance coverage will need to be confirmed)  03/25/2025   Hepatitis C Screening  Completed   HIV Screening  Completed   HPV VACCINES  Aged Out

## 2021-05-13 NOTE — Progress Notes (Signed)
EPIC Encounter for ICM Monitoring  Patient Name: Susan Peck is a 55 y.o. female Date: 05/13/2021 Primary Care Physican: Harvie Heck, MD Primary Cardiologist: Caryl Comes (Bensimhon as needed) Electrophysiologist: Vergie Living Pacing: 99.7%  05/13/2021 Weight: 194 lbs     Spoke with patient and heart failure questions reviewed.  Pt asymptomatic for fluid accumulation and feeling well.  She said she has been off her diet and eating foods high salt and drinking too much fluids.   Optivol Thoracic impedance suggesting possible fluid acccumulation starting 07/07/2021 but trending back to baseline normal).     Prescribed:  Furosemide 40 mg take 2 tablets (80 mg total) every morning and 1 tablet (40 mg total) every evening.   Labs: 01/26/2021 Creatinine 0.59, Potassium 4.2, Sodium 141, GFR 106 A complete set of results can be found in Results Review.   Recommendations:  Recommendation to limit salt intake to 2000 mg daily and fluid intake to 64 oz daily.  Encouraged to call if experiencing any fluid symptoms.    Follow-up plan: ICM clinic phone appointment on 06/20/2021.   91 day device clinic remote transmission 07/04/2021.     EP/Cardiology Office Visits:   08/02/2021 with Dr Caryl Comes.   Copy of ICM check sent to Dr. Caryl Comes.      3 month ICM trend: 05/11/2021.    1 Year ICM trend:       [name], RN 05/13/2021 11:20 AM

## 2021-05-26 ENCOUNTER — Other Ambulatory Visit: Payer: Self-pay | Admitting: Internal Medicine

## 2021-05-26 DIAGNOSIS — S81801A Unspecified open wound, right lower leg, initial encounter: Secondary | ICD-10-CM

## 2021-05-26 NOTE — Telephone Encounter (Signed)
Next appt scheduled 06/01/21 with dr Johnney Ou.

## 2021-06-01 ENCOUNTER — Ambulatory Visit (INDEPENDENT_AMBULATORY_CARE_PROVIDER_SITE_OTHER): Payer: Medicare Other | Admitting: Student

## 2021-06-01 VITALS — BP 108/71 | HR 78 | Wt 196.0 lb

## 2021-06-01 DIAGNOSIS — E1169 Type 2 diabetes mellitus with other specified complication: Secondary | ICD-10-CM

## 2021-06-01 DIAGNOSIS — K5900 Constipation, unspecified: Secondary | ICD-10-CM | POA: Diagnosis not present

## 2021-06-01 DIAGNOSIS — R1012 Left upper quadrant pain: Secondary | ICD-10-CM

## 2021-06-01 DIAGNOSIS — E1165 Type 2 diabetes mellitus with hyperglycemia: Secondary | ICD-10-CM | POA: Diagnosis not present

## 2021-06-01 DIAGNOSIS — R21 Rash and other nonspecific skin eruption: Secondary | ICD-10-CM | POA: Diagnosis not present

## 2021-06-01 LAB — POCT GLYCOSYLATED HEMOGLOBIN (HGB A1C): Hemoglobin A1C: 7.5 % — AB (ref 4.0–5.6)

## 2021-06-01 LAB — GLUCOSE, CAPILLARY: Glucose-Capillary: 137 mg/dL — ABNORMAL HIGH (ref 70–99)

## 2021-06-01 MED ORDER — CLOTRIMAZOLE 1 % EX CREA: 1.0000 "application " | TOPICAL_CREAM | Freq: Two times a day (BID) | CUTANEOUS | 0 refills | Status: DC

## 2021-06-01 MED ORDER — TRIAMCINOLONE ACETONIDE 0.1 % EX CREA: 1.0000 "application " | TOPICAL_CREAM | Freq: Two times a day (BID) | CUTANEOUS | 0 refills | Status: DC

## 2021-06-01 NOTE — Assessment & Plan Note (Signed)
Assessment: Patient with erythematous scaly plaques of left foot as well as erythematous circular rash on medial aspect of right foot.  Suspect these are fungal in etiology.  Patient's lichenified plaque on left foot appears to be from chronic scratching.  I believe we should start with antifungal and then as long as her rashes and itching improves can use steroid cream on the lichenified area.  Plan: -Lotrimin cream twice daily to feet

## 2021-06-01 NOTE — Assessment & Plan Note (Addendum)
Patient states she continues to deal with chronic constipation she states she has bowel movements every couple days..  Recommended fiber supplements daily and if no improvement the addition of MiraLAX or senna.  Patient states she does not use her Linzess daily.  She is continue to have bowel movements, I have low suspicion that her left upper quadrant abdominal pain is secondary to bowel obstruction

## 2021-06-01 NOTE — Assessment & Plan Note (Addendum)
A1c up trended from 6.4 to 7.5.  Current regimen of Xultophy 12 units daily.  This resulted after patient left.  Will call tomorrow to discuss further and to make sure she has no issues taking her medications.  Addendum: Discussed with Susan Peck her A1c increased.  She states that she has been eating poorly recently and knows what she needs to do to turn this around.  She would not like to meet further with Butch Penny.  I discussed with her that we will have her return to our clinic in 3 months for recheck.  She is amenable to this.

## 2021-06-01 NOTE — Patient Instructions (Signed)
Thank you, Ms.Arasely B Chevalier for allowing [place] to provide your care today. Today we discussed .  Left upper abdominal pain We will be referring you to GI for your abdominal pain as you are also due for a colonoscopy. Please call the clinic if the pain worsens.   Foot Rash/Itching I will be prescribing a steroid cream to help with this. Please apply it to the itchy areas twice daily    I have ordered the following labs for you:   Lab Orders         Glucose, capillary         POC Hbg A1C       Referrals ordered today:    Referral Orders         Ambulatory referral to Gastroenterology      I have ordered the following medication/changed the following medications:   Stop the following medications: There are no discontinued medications.   Start the following medications: Meds ordered this encounter  Medications   triamcinolone cream (KENALOG) 0.1 %    Sig: Apply 1 application topically 2 (two) times daily. Apply to itchy parts of foot    Dispense:  45 g    Refill:  0    Follow up:    3 months  Remember:   Should you have any questions or concerns please call the internal medicine clinic at [phone].    Sanjuana Letters, D.O. [place]

## 2021-06-01 NOTE — Progress Notes (Signed)
CC: Left upper quadrant abdominal pain  HPI:  [name] is a 54 y.o. female with a past medical history stated below and presents today for left upper quadrant abdominal pain. Please see problem based assessment and plan for additional details.  Past Medical History:  Diagnosis Date   AICD (automatic cardioverter/defibrillator) present 12/13/2016   Anxiety    no meds   Asthma    CHF (congestive heart failure) ([place])    Depression    no meds   Diabetes mellitus    recent dx 11/17/11 - started med 11/18/11 type 2   GERD (gastroesophageal reflux disease)    Goiter 07/27/2011   non-neoplastic goiter - fine needle aspiration - benign sees dr Dwyane Dee for   Heart murmur    dx 2 yrs ago per pt   Herpes genitalis in women    Hypertension    IBS (irritable bowel syndrome)    tx with diet per pt   Low back pain    history   Obesity    Seasonal allergies    Shortness of breath    occasional - exercise induced   Sleep apnea    cpap broken   Systolic heart failure    May 2011 EF 35-40%, 04/03/11 EF 25-30%, 06/27/11 EF 30-35%    Current Outpatient Medications on File Prior to Visit  Medication Sig Dispense Refill   aspirin 81 MG chewable tablet Chew 81 mg by mouth daily.      Aspirin-Acetaminophen-Caffeine (GOODY HEADACHE PO) Take 1 packet by mouth daily as needed (pain).     aspirin-sod bicarb-citric acid (ALKA-SELTZER) 325 MG TBEF tablet Take 650 mg by mouth every 6 (six) hours as needed (indigestion).     atorvastatin (LIPITOR) 40 MG tablet TAKE 1 TABLET BY MOUTH EVERY DAY 90 tablet 3   BD PEN NEEDLE NANO 2ND GEN 32G X 4 MM MISC USE TO INJECT XULTOPHY DAILY 100 each 5   calcium carbonate (TUMS - DOSED IN MG ELEMENTAL CALCIUM) 500 MG chewable tablet Chew 2,000 mg by mouth daily as needed for indigestion or heartburn.     carvedilol (COREG) 25 MG tablet TAKE 1 TABLET (25 MG TOTAL) BY MOUTH 2 (TWO) TIMES DAILY WITH A MEAL. 180 tablet 3   [employer] SENNA PLUS 8.6-50 MG tablet TAKE 1  TABLET BY MOUTH EVERY DAY 30 tablet 3   diclofenac Sodium (VOLTAREN) 1 % GEL APPLY 2 GRAMS TO AFFECTED AREA 4 TIMES A DAY 100 g 1   DULoxetine (CYMBALTA) 30 MG capsule TAKE 1 CAPSULE (30 MG TOTAL) BY MOUTH DAILY FOR 14 DAYS, THEN 2 CAPSULES (60 MG TOTAL) DAILY. 90 capsule 1   ENTRESTO 97-103 MG TAKE 1 TABLET BY MOUTH TWICE A DAY 60 tablet 11   eplerenone (INSPRA) 25 MG tablet Take 25 mg by mouth daily.     fluticasone (FLONASE) 50 MCG/ACT nasal spray PLACE 1 SPRAY INTO BOTH NOSTRILS 2 (TWO) TIMES DAILY 48 mL 1   furosemide (LASIX) 40 MG tablet TAKE 2 TABS EVERY MORNING AND 1 TAB EVERY EVENING. 270 tablet 0   glucose blood (ONETOUCH VERIO) test strip Insulin dependent. Check blood sugar up to 3 times daily. diag code E11.65 100 each 12   Lancet Devices (ONETOUCH DELICA PLUS LANCING) MISC USE AS DIRECTED TO CHECK BLOOD SUGAR 1 each 2   linaclotide (LINZESS) 145 MCG CAPS capsule Take 145 mcg by mouth daily before breakfast.     methocarbamol (ROBAXIN) 500 MG tablet Take 1 tablet (500  mg total) by mouth 3 (three) times daily as needed for muscle spasms. 60 tablet 0   mupirocin cream (BACTROBAN) 2 % APPLY TO AFFECTED AREA TWICE A DAY 15 g 1   ONETOUCH DELICA LANCETS FINE MISC Insulin dependent. Check blood sugar up to 3 times daily. diag code E11.65 100 each 12   PATADAY 0.2 % SOLN Place 1 drop into both eyes daily.  4   polyethylene glycol (MIRALAX / GLYCOLAX) 17 g packet Take 17 g by mouth daily as needed.     VYZULTA 0.024 % SOLN Place 1 drop into both eyes at bedtime.     XULTOPHY 100-3.6 UNIT-MG/ML SOPN INJECT 12 UNITS/DAY INTO THE SKIN DAILY IN THE AFTERNOON. 3 mL 11   No current facility-administered medications on file prior to visit.    Family History  Problem Relation Age of Onset   Heart disease Maternal Aunt    Breast cancer Mother    Thyroid disease Mother    Hypertension Maternal Grandmother    Diabetes Maternal Grandmother    Breast cancer Maternal Aunt    Heart disease  Maternal Aunt    Diabetes Maternal Grandfather    Diabetes Paternal Grandmother    Diabetes Paternal Grandfather    Hypertension Paternal Grandfather     Social History   Socioeconomic History   Marital status: Divorced    Spouse name: Not on file   Number of children: Y   Years of education: Not on file   Highest education level: Not on file  Occupational History   Occupation: Surveyor, quantity: [employer]    Comment: and Systems analyst  Tobacco Use   Smoking status: Never   Smokeless tobacco: Never  Scientific laboratory technician Use: Never used  Substance and Sexual Activity   Alcohol use: Not Currently    Alcohol/week: 0.0 standard drinks   Drug use: No   Sexual activity: Not Currently    Birth control/protection: Surgical    Comment: des neg, intercourse age 73, more than sexual parterns  Other Topics Concern   Not on file  Social History Narrative   She lives with her husband and son.  She is works for [employer].   Social Determinants of Health   Financial Resource Strain: Not on file  Food Insecurity: Not on file  Transportation Needs: Not on file  Physical Activity: Not on file  Stress: Not on file  Social Connections: Not on file  Intimate Partner Violence: Not on file    Review of Systems: ROS negative except for what is noted on the assessment and plan.  Vitals:   06/01/21 1359  BP: 108/71  Pulse: 78  SpO2: 97%  Weight: 196 lb (88.9 kg)   Physical Exam: Constitutional: No acute distress HENT: normocephalic atraumatic Eyes: conjunctiva non-erythematous Neck: supple Cardiovascular: regular rate and rhythm, no m/r/g Pulmonary/Chest: normal work of breathing on room air Abdominal: soft, non-tender, non-distended.  Hypoactive bowel sounds.  No palpable masses appreciated. MSK: normal bulk and tone Neurological: alert & oriented x 3.   Skin: warm and dry. Lichenification on lateral aspect of left foot as well as circular erythematous rash on medial  aspect of right foot.` Psych: Normal mood and thought process  Assessment & Plan:   See Encounters Tab for problem based charting.  Patient discussed with Dr. Caffie Damme, D.O. [facility], PGY-2 Pager: [phone], Phone: [phone] Date 06/01/2021 Time 3:16 PM

## 2021-06-01 NOTE — Assessment & Plan Note (Signed)
Assessment: Patient presents for follow-up of her left upper quadrant pain.  She had a CT abdomen pelvis without contrast that did not show any pathology that explains her pain.  She describes the pain as a ache that occurs in her left upper quadrant, will ball up and she has to physically push it down.  She states this happens with certain movements.  Denies anything making the pain better.  This is been ongoing for the past 6 months.  My differential includes adhesions, patient had laparoscopic hysterectomy and repeat laparoscopy for adhesions.  She also is due for colonoscopy, will refer to GI for that.  Low suspicion that this is referred pain from her ICD.  Other possible differentials are splenic infarct.  We will continue to follow and if patient systems persist consider abdominal imaging with contrast.  Plan: -Referral to GI for colonoscopy -If no improvement consider CT abdomen pelvis with contrast.

## 2021-06-02 NOTE — Progress Notes (Signed)
Internal Medicine Clinic Attending  Case discussed with Dr. Katsadouros  At the time of the visit.  We reviewed the resident's history and exam and pertinent patient test results.  I agree with the assessment, diagnosis, and plan of care documented in the resident's note.  

## 2021-06-15 ENCOUNTER — Other Ambulatory Visit: Payer: Self-pay | Admitting: Student

## 2021-06-15 ENCOUNTER — Other Ambulatory Visit: Payer: Self-pay | Admitting: Internal Medicine

## 2021-06-15 DIAGNOSIS — I5022 Chronic systolic (congestive) heart failure: Secondary | ICD-10-CM

## 2021-06-20 ENCOUNTER — Ambulatory Visit (INDEPENDENT_AMBULATORY_CARE_PROVIDER_SITE_OTHER): Payer: Medicare Other

## 2021-06-20 DIAGNOSIS — Z95810 Presence of automatic (implantable) cardiac defibrillator: Secondary | ICD-10-CM

## 2021-06-20 DIAGNOSIS — I5022 Chronic systolic (congestive) heart failure: Secondary | ICD-10-CM | POA: Diagnosis not present

## 2021-06-21 ENCOUNTER — Telehealth: Payer: Self-pay

## 2021-06-21 NOTE — Telephone Encounter (Signed)
Remote ICM transmission received.  Attempted call to patient regarding ICM remote transmission and left detailed message per DPR to return call.   

## 2021-06-21 NOTE — Progress Notes (Signed)
EPIC Encounter for ICM Monitoring  Patient Name: MONQUE HAGGAR is a 55 y.o. female Date: 06/21/2021 Primary Care Physican: Harvie Heck, MD Primary Cardiologist: Caryl Comes (Bensimhon as needed) Electrophysiologist: Vergie Living Pacing: 99.9%  05/13/2021 Weight: 194 lbs     Attempted call to patient and unable to reach.  Left message to return call. Transmission reviewed.    Optivol Thoracic impedance suggesting possible fluid acccumulation starting 06/07/2021.  Prescribed:  Furosemide 40 mg take 2 tablets (80 mg total) every morning and 1 tablet (40 mg total) every evening.   Labs: 01/26/2021 Creatinine 0.59, Potassium 4.2, Sodium 141, GFR 106 A complete set of results can be found in Results Review.   Recommendations: Unable to reach.  Will advise patient to take extra Furosemide per ICM protocol if reached.    Follow-up plan: ICM clinic phone appointment on 06/27/2021 to recheck fluid levels.   91 day device clinic remote transmission 07/04/2021.     EP/Cardiology Office Visits:   08/02/2021 with Dr Caryl Comes.   Copy of ICM check sent to Dr. Caryl Comes.    3 month ICM trend: 06/20/2021.    12-14 Month ICM trend:       [name], RN 06/21/2021 12:06 PM

## 2021-06-24 NOTE — Progress Notes (Signed)
Attempted return call to patient and unable to reach.  Left message to return call.

## 2021-06-27 ENCOUNTER — Ambulatory Visit (INDEPENDENT_AMBULATORY_CARE_PROVIDER_SITE_OTHER): Payer: Medicare Other

## 2021-06-27 ENCOUNTER — Telehealth: Payer: Self-pay

## 2021-06-27 DIAGNOSIS — I5022 Chronic systolic (congestive) heart failure: Secondary | ICD-10-CM

## 2021-06-27 DIAGNOSIS — Z95810 Presence of automatic (implantable) cardiac defibrillator: Secondary | ICD-10-CM

## 2021-06-27 NOTE — Telephone Encounter (Signed)
Spoke with patient.  Dicussed last weeks remote transmission and possible fluid accumulation.  She said she feels a little full.  She will send manual remote transmission this evening for review tomorrow.  Advised will call tomorrow after reviewing remote transmission.

## 2021-06-28 ENCOUNTER — Telehealth: Payer: Self-pay

## 2021-06-28 NOTE — Progress Notes (Signed)
Spoke with patient and heart failure questions reviewed.  Pt asymptomatic for fluid accumulation.  She thinks her eating habits may be contributing to fluid accumulation.  She will take extra Furosemide 40 mg x 2 days and recheck fluid levels 12/9

## 2021-06-28 NOTE — Telephone Encounter (Signed)
Remote ICM transmission received.  Attempted call to patient regarding ICM remote transmission and left message per DPR to return call.   

## 2021-06-28 NOTE — Progress Notes (Signed)
EPIC Encounter for ICM Monitoring  Patient Name: Susan Peck is a 55 y.o. female Date: 06/28/2021 Primary Care Physican: Harvie Heck, MD Primary Cardiologist: Caryl Comes (Bensimhon as needed) Electrophysiologist: Vergie Living Pacing: 99.9%  05/13/2021 Weight: 194 lbs     Attempted call to patient and unable to reach.  Left message to return call. Transmission reviewed.    Optivol Thoracic impedance returned to normal on 11/27 but possible fluid accumulation returned starting 12/1.   Prescribed:  Furosemide 40 mg take 2 tablets (80 mg total) every morning and 1 tablet (40 mg total) every evening.   Labs: 01/26/2021 Creatinine 0.59, Potassium 4.2, Sodium 141, GFR 106 A complete set of results can be found in Results Review.   Recommendations: Unable to reach.  Will advise patient to take extra Furosemide per ICM protocol if reached.    Follow-up plan: ICM clinic phone appointment on 07/01/2021 to recheck fluid levels.   91 day device clinic remote transmission 07/04/2021.     EP/Cardiology Office Visits:   08/02/2021 with Dr Caryl Comes.   Copy of ICM check sent to Dr. Caryl Comes.    3 month ICM trend: 06/28/2021.    12-14 Month ICM trend:       [name], RN 06/28/2021 3:36 PM

## 2021-07-01 ENCOUNTER — Ambulatory Visit (INDEPENDENT_AMBULATORY_CARE_PROVIDER_SITE_OTHER): Payer: Medicare Other

## 2021-07-01 DIAGNOSIS — Z95810 Presence of automatic (implantable) cardiac defibrillator: Secondary | ICD-10-CM

## 2021-07-01 DIAGNOSIS — I5022 Chronic systolic (congestive) heart failure: Secondary | ICD-10-CM

## 2021-07-01 NOTE — Progress Notes (Signed)
EPIC Encounter for ICM Monitoring  Patient Name: Susan Peck is a 55 y.o. female Date: 07/01/2021 Primary Care Physican: Harvie Heck, MD Primary Cardiologist: Caryl Comes (Bensimhon as needed) Electrophysiologist: Vergie Living Pacing: 99.8%  05/13/2021 Weight: 194 lbs     Spoke with patient and heart failure questions reviewed.  Pt is doing well.   Optivol Thoracic impedance returned to normal.   Prescribed:  Furosemide 40 mg take 2 tablets (80 mg total) every morning and 1 tablet (40 mg total) every evening.   Labs: 01/26/2021 Creatinine 0.59, Potassium 4.2, Sodium 141, GFR 106 A complete set of results can be found in Results Review.   Recommendations: No changes and encouraged to call if experiencing any fluid symptoms.   Follow-up plan: ICM clinic phone appointment on 08/01/2021.   91 day device clinic remote transmission 07/04/2021.     EP/Cardiology Office Visits:   08/02/2021 with Dr Caryl Comes.   Copy of ICM check sent to Dr. Caryl Comes.    3 month ICM trend: 07/01/2021.    12-14 Month ICM trend:       [name], RN 07/01/2021 2:55 PM

## 2021-07-04 ENCOUNTER — Ambulatory Visit (INDEPENDENT_AMBULATORY_CARE_PROVIDER_SITE_OTHER): Payer: Medicare Other

## 2021-07-04 DIAGNOSIS — I428 Other cardiomyopathies: Secondary | ICD-10-CM | POA: Diagnosis not present

## 2021-07-05 LAB — CUP PACEART REMOTE DEVICE CHECK
Battery Remaining Longevity: 28 mo
Battery Voltage: 2.94 V
Brady Statistic AP VP Percent: 0.08 %
Brady Statistic AP VS Percent: 0.01 %
Brady Statistic AS VP Percent: 99.87 %
Brady Statistic AS VS Percent: 0.04 %
Brady Statistic RA Percent Paced: 0.09 %
Brady Statistic RV Percent Paced: 99.9 %
Date Time Interrogation Session: 20221213022824
HighPow Impedance: 76 Ohm
Implantable Lead Implant Date: 20180524
Implantable Lead Implant Date: 20180524
Implantable Lead Implant Date: 20180524
Implantable Lead Location: 753858
Implantable Lead Location: 753859
Implantable Lead Location: 753860
Implantable Lead Model: 4398
Implantable Lead Model: 5076
Implantable Pulse Generator Implant Date: 20180524
Lead Channel Impedance Value: 182.205
Lead Channel Impedance Value: 192.262
Lead Channel Impedance Value: 204.14 Ohm
Lead Channel Impedance Value: 216.848
Lead Channel Impedance Value: 222.34 Ohm
Lead Channel Impedance Value: 323 Ohm
Lead Channel Impedance Value: 399 Ohm
Lead Channel Impedance Value: 399 Ohm
Lead Channel Impedance Value: 418 Ohm
Lead Channel Impedance Value: 456 Ohm
Lead Channel Impedance Value: 475 Ohm
Lead Channel Impedance Value: 532 Ohm
Lead Channel Impedance Value: 608 Ohm
Lead Channel Impedance Value: 665 Ohm
Lead Channel Impedance Value: 665 Ohm
Lead Channel Impedance Value: 665 Ohm
Lead Channel Impedance Value: 684 Ohm
Lead Channel Impedance Value: 741 Ohm
Lead Channel Pacing Threshold Amplitude: 0.375 V
Lead Channel Pacing Threshold Amplitude: 0.5 V
Lead Channel Pacing Threshold Amplitude: 0.625 V
Lead Channel Pacing Threshold Pulse Width: 0.4 ms
Lead Channel Pacing Threshold Pulse Width: 0.4 ms
Lead Channel Pacing Threshold Pulse Width: 1 ms
Lead Channel Sensing Intrinsic Amplitude: 26.375 mV
Lead Channel Sensing Intrinsic Amplitude: 26.375 mV
Lead Channel Sensing Intrinsic Amplitude: 4.25 mV
Lead Channel Sensing Intrinsic Amplitude: 4.25 mV
Lead Channel Setting Pacing Amplitude: 0.75 V
Lead Channel Setting Pacing Amplitude: 1.5 V
Lead Channel Setting Pacing Amplitude: 2.5 V
Lead Channel Setting Pacing Pulse Width: 0.4 ms
Lead Channel Setting Pacing Pulse Width: 1 ms
Lead Channel Setting Sensing Sensitivity: 0.3 mV

## 2021-07-14 NOTE — Progress Notes (Signed)
Remote ICD transmission.   

## 2021-07-19 ENCOUNTER — Telehealth: Payer: Self-pay

## 2021-07-19 NOTE — Telephone Encounter (Signed)
Return pt's call. Stated on Sunday, she started coughing and blowing her nose in which at first she thought it was her sinuses since she has sinus problems "all my life". But after being out and about the day before, she decided to test herself of covid today and is +. She has not been vaccinated. Denies fever, sob, cp. Stated she feels ok except for a cough and drainage from her sinuses. Instructed pt to drink fluids, get her rest , wear a mask around others and to isolate. She asked about getting the covid medication - told her I will inform her doctor. No appts until next week.

## 2021-07-19 NOTE — Telephone Encounter (Signed)
Pt is requesting a call back .. she stated that she has tested positive for covid and was told to contact her PCP .Marland Kitchen pt was tested today 12/27

## 2021-07-20 ENCOUNTER — Telehealth: Payer: Self-pay | Admitting: *Deleted

## 2021-07-20 NOTE — Telephone Encounter (Signed)
I reviewed note.  Based on symptoms, she should take a cough suppressant and a medication to thin mucus.   For the cough, I would recommend chlorpheniramine products like Coriciden.  Given her other health issues, would recommend she avoid dextromethorphin or other decongestants.   For the mucus thinning, would recommend Mucinex, but avoid Mucinex DM.  There might be a product with mucinex (guaifenisin) and chlorpheniramine together as well.    If she is no better in the next 48 - 72 hours, we can sent in a steroid taper.   Thanks!  Gilles Chiquito, MD

## 2021-07-20 NOTE — Telephone Encounter (Addendum)
Call from patient.  Contiunes to have symptoms with her Covid.  Wants to know if she can get the medication for Covid.  Tested positive on yesterday.  Symptoms started on Sunday.  RTC to patient no fevers is hot all the time anyway.  Blowing nose a lot Clear to yellow green mucous..  Feels like mucus is stuck in throat.  Feels like she is chocking . Green yellow Phlegm as well.   Comes up a little at the time. Sleeping a lot.  No wheezing.   Used C-Pap machine last night.  Little tightness in chest when she coughs.   Gets a little dizzy when she gets up.  Has to stand for a few seconds.  Has a Headache from coughing.  Doing a lot of coughing. Not taking anything for the Sinuses.  Took some Nyquil with Honey.  Felt better after taking.  Still weak.  Drinking water, tea and soup.  No Diarrhea.  Throat sore when she coughs.    RTC to patient given options for OTC meds for Covid Symptoms.  Stressed to patient that she is not a Candidate for the Paxlovid due to medications she is presently taking.  Patient voiced understanding of.  Patient was given the option of taking Chlorpheniramine products like Coriciden .  Patient to avoid Dextromethorpin or any decongestants.   Mucinex for thinning of her mucous was recommended and stressed no Mucinex DM.  She may be able to find a medication that has both the Mucinex (Guaifenisin )and Chlorphenirmine together as well.  Patient is to give [place] a call if she is not better in 48-72 hours and may be able to do a Prednisone taper.  Patient voiced understanding of the plan and will call if needed.

## 2021-07-20 NOTE — Telephone Encounter (Signed)
Paxlovid is contraindicated with 2 of her medications.  I would not prescribe at this time.  Can you see what her symptoms are exactly?  We can send in some supportive medications to get her through.  The symptoms can last up to 10 days, so it is not surprising she is still feeling unwell.   Thanks

## 2021-07-20 NOTE — Telephone Encounter (Signed)
See telephone encounter 07/20/21.

## 2021-07-26 ENCOUNTER — Telehealth: Payer: Self-pay

## 2021-07-26 NOTE — Telephone Encounter (Incomplete Revision)
RTC to patient.  States feeling better.  Used the Mucinex as advised and the Coricidin.  Cough productive clear to yellow.  Would like to get something for the frequent coughing.  Retested self.  Covid negative now.  Would like something called to the [facility] if possible.  Message to be sent to PCP for advice on cough medication.

## 2021-07-26 NOTE — Telephone Encounter (Addendum)
RTC to patient.  States feeling better.  Used the Mucinex as advised and the Coricidin.  Cough productive clear to yellow.  Would like to get something for the frequent coughing.  Retested self.  Covid negative now.  Would like something called to the [facility] if possible.  Message to be sent to PCP for advice on cough medication.

## 2021-07-26 NOTE — Telephone Encounter (Signed)
Requesting to speak with a nurse about COVID results, please call back.

## 2021-07-27 NOTE — Telephone Encounter (Signed)
RTC to patient given instructions per Dr. Marva Panda to take Robitussin or Delysm.  Patient to try and if no relief will call for an appointment per Dr. Marva Panda.

## 2021-08-01 ENCOUNTER — Telehealth: Payer: Self-pay

## 2021-08-01 ENCOUNTER — Ambulatory Visit (INDEPENDENT_AMBULATORY_CARE_PROVIDER_SITE_OTHER): Payer: Commercial Managed Care - HMO

## 2021-08-01 DIAGNOSIS — Z95810 Presence of automatic (implantable) cardiac defibrillator: Secondary | ICD-10-CM

## 2021-08-01 DIAGNOSIS — I5022 Chronic systolic (congestive) heart failure: Secondary | ICD-10-CM | POA: Diagnosis not present

## 2021-08-01 NOTE — Telephone Encounter (Signed)
Remote ICM transmission received.  Attempted call to patient regarding ICM remote transmission and left detailed message per DPR.  Advised to return call for any fluid symptoms or questions. Next ICM remote transmission scheduled 09/05/2021.

## 2021-08-01 NOTE — Progress Notes (Signed)
EPIC Encounter for ICM Monitoring  Patient Name: Susan Peck is a 56 y.o. female Date: 08/01/2021 Primary Care Physican: Harvie Heck, MD Primary Cardiologist: Caryl Comes (Bensimhon as needed) Electrophysiologist: Vergie Living Pacing: 99.7%  05/13/2021 Weight: 194 lbs     Attempted call to patient and unable to reach.  Left detailed message per DPR regarding transmission. Transmission reviewed.    Optivol Thoracic impedance returned to normal.   Prescribed:  Furosemide 40 mg take 2 tablets (80 mg total) every morning and 1 tablet (40 mg total) every evening.   Labs: 01/26/2021 Creatinine 0.59, Potassium 4.2, Sodium 141, GFR 106 A complete set of results can be found in Results Review.   Recommendations:  Left voice mail with ICM number and encouraged to call if experiencing any fluid symptoms.   Follow-up plan: ICM clinic phone appointment on 09/05/2021.   91 day device clinic remote transmission 10/03/2021.     EP/Cardiology Office Visits:   08/02/2021 with Dr Caryl Comes.   Copy of ICM check sent to Dr. Caryl Comes.    3 month ICM trend: 08/01/2021.    12-14 Month ICM trend:     [name], RN 08/01/2021 10:30 AM

## 2021-08-02 ENCOUNTER — Encounter: Payer: Medicare Other | Admitting: Internal Medicine

## 2021-08-15 ENCOUNTER — Other Ambulatory Visit: Payer: Self-pay | Admitting: Internal Medicine

## 2021-08-15 DIAGNOSIS — E1165 Type 2 diabetes mellitus with hyperglycemia: Secondary | ICD-10-CM

## 2021-08-25 ENCOUNTER — Ambulatory Visit (INDEPENDENT_AMBULATORY_CARE_PROVIDER_SITE_OTHER): Payer: Medicare Other | Admitting: Internal Medicine

## 2021-08-25 DIAGNOSIS — B342 Coronavirus infection, unspecified: Secondary | ICD-10-CM

## 2021-08-25 NOTE — Assessment & Plan Note (Signed)
The patient is being seen today due to post COVID cough.  Patient states that she got COVID around Christmas time last year.  Per chart review, Dr. Marva Panda advised the patient to take Robitussin and Delsym for her cough, and if symptoms worsened to follow-up in our clinic.  Patient states that her cough has been about the same since its onset.  She has a feeling of "phlegm in her throat."  Robitussin and Delsym have helped relieve her cough.  She has also tried cough drops and TheraFlu which did not alleviate the cough.  Denies shortness of breath.  Endorses slight chest pain that has been a chronic issue for her.  Plan: I discussed with the patient that post-COVID cough can last anywhere from 4-6 weeks, but it can also persist longer.  I advised her to follow-up in our clinic if her cough worsens or does not improve in the next 2-3 weeks.  Advised to continue Robitussin and Delsym as needed.

## 2021-08-25 NOTE — Progress Notes (Signed)
°  [facility] Internal Medicine Residency Telephone Encounter Continuity Care Appointment  HPI:  This telephone encounter was created for [name] on 08/25/2021 for the following purpose/cc: post-COVID cough.   Past Medical History:  Past Medical History:  Diagnosis Date   AICD (automatic cardioverter/defibrillator) present 12/13/2016   Anxiety    no meds   Asthma    CHF (congestive heart failure) ([place])    Depression    no meds   Diabetes mellitus    recent dx 11/17/11 - started med 11/18/11 type 2   GERD (gastroesophageal reflux disease)    Goiter 07/27/2011   non-neoplastic goiter - fine needle aspiration - benign sees dr Dwyane Dee for   Heart murmur    dx 2 yrs ago per pt   Herpes genitalis in women    Hypertension    IBS (irritable bowel syndrome)    tx with diet per pt   Low back pain    history   Obesity    Seasonal allergies    Shortness of breath    occasional - exercise induced   Sleep apnea    cpap broken   Systolic heart failure    May 2011 EF 35-40%, 04/03/11 EF 25-30%, 06/27/11 EF 30-35%     ROS:  Negative aside from that listed in individualized problem based charting.   Assessment / Plan / Recommendations:  Please see A&P under problem oriented charting for assessment of the patient's acute and chronic medical conditions.  As always, pt is advised that if symptoms worsen or new symptoms arise, they should go to an urgent care facility or to to ER for further evaluation.   Consent and Medical Decision Making:  Patient discussed with Dr. Evette Doffing This is a telephone encounter between Domenick Bookbinder and Orvis Brill on 08/25/2021 for post-COVID cough. The visit was conducted with the patient located at home and Orvis Brill at [facility]. The patient's identity was confirmed using their DOB and current address. The patient has consented to being evaluated through a telephone encounter and understands the associated risks (an examination cannot be done  and the patient may need to come in for an appointment) / benefits (allows the patient to remain at home, decreasing exposure to coronavirus). I personally spent 10 minutes on medical discussion.

## 2021-08-26 NOTE — Progress Notes (Signed)
Internal Medicine Clinic Attending  Case discussed with Dr. Lorin Glass  At the time of the visit.  We reviewed the residents history and pertinent patient test results.  I agree with the assessment, diagnosis, and plan of care documented in the residents note.

## 2021-08-26 NOTE — Addendum Note (Signed)
Addended by: Lalla Brothers T on: 08/26/2021 09:18 AM   Modules accepted: Level of Service

## 2021-09-05 ENCOUNTER — Ambulatory Visit (INDEPENDENT_AMBULATORY_CARE_PROVIDER_SITE_OTHER): Payer: Medicare Other

## 2021-09-05 DIAGNOSIS — Z95810 Presence of automatic (implantable) cardiac defibrillator: Secondary | ICD-10-CM | POA: Diagnosis not present

## 2021-09-05 DIAGNOSIS — I5022 Chronic systolic (congestive) heart failure: Secondary | ICD-10-CM

## 2021-09-07 ENCOUNTER — Telehealth: Payer: Self-pay

## 2021-09-07 NOTE — Telephone Encounter (Signed)
Remote ICM transmission received.  Attempted call to patient regarding ICM remote transmission and left detailed message per DPR to return call.   

## 2021-09-07 NOTE — Progress Notes (Signed)
EPIC Encounter for ICM Monitoring  Patient Name: Susan Peck is a 56 y.o. female Date: 09/07/2021 Primary Care Physican: Harvie Heck, MD Primary Cardiologist: Caryl Comes (Bensimhon as needed) Electrophysiologist: Vergie Living Pacing: 99.9%  05/13/2021 Weight: 194 lbs     Attempted call to patient and unable to reach.  Left detailed message per DPR regarding transmission. Transmission reviewed.    Optivol Thoracic impedance suggesting possible fluid accumulation starting 2/10.   Impedance also suggesting possible fluid accumulation from 1/10-1/28.   Prescribed:  Furosemide 40 mg take 2 tablets (80 mg total) every morning and 1 tablet (40 mg total) every evening.   Labs: 01/26/2021 Creatinine 0.59, Potassium 4.2, Sodium 141, GFR 106 A complete set of results can be found in Results Review.   Recommendations:  Left voice mail with ICM number and encouraged to call if experiencing any fluid symptoms.   Follow-up plan: ICM clinic phone appointment on 09/19/2021 to recheck fluid levels.   91 day device clinic remote transmission 10/03/2021.     EP/Cardiology Office Visits:   09/29/2021 with Dr Caryl Comes.   Copy of ICM check sent to Dr. Caryl Comes.     3 month ICM trend: 09/05/2021.    12-14 Month ICM trend:     [name], RN 09/07/2021 3:12 PM

## 2021-09-10 ENCOUNTER — Other Ambulatory Visit: Payer: Self-pay | Admitting: Internal Medicine

## 2021-09-10 DIAGNOSIS — I5022 Chronic systolic (congestive) heart failure: Secondary | ICD-10-CM

## 2021-09-15 ENCOUNTER — Encounter: Payer: Commercial Managed Care - HMO | Admitting: Internal Medicine

## 2021-09-19 ENCOUNTER — Ambulatory Visit (INDEPENDENT_AMBULATORY_CARE_PROVIDER_SITE_OTHER): Payer: Medicare Other

## 2021-09-19 DIAGNOSIS — I5022 Chronic systolic (congestive) heart failure: Secondary | ICD-10-CM | POA: Diagnosis not present

## 2021-09-19 DIAGNOSIS — Z95810 Presence of automatic (implantable) cardiac defibrillator: Secondary | ICD-10-CM | POA: Diagnosis not present

## 2021-09-20 ENCOUNTER — Telehealth: Payer: Self-pay

## 2021-09-20 NOTE — Telephone Encounter (Signed)
Remote ICM transmission received.  Attempted call to patient regarding ICM remote transmission and left detailed message per DPR to return call.  Advised to return call for any fluid symptoms or questions. Next ICM remote transmission scheduled 09/27/2021.

## 2021-09-20 NOTE — Progress Notes (Signed)
EPIC Encounter for ICM Monitoring  Patient Name: SHAIRA SOVA is a 56 y.o. female Date: 09/20/2021 Primary Care Physican: Harvie Heck, MD Primary Cardiologist: Caryl Comes (Bensimhon as needed) Electrophysiologist: Vergie Living Pacing: 99.9%  05/13/2021 Weight: 194 lbs     Attempted call to patient and unable to reach.  Left detailed message per DPR regarding transmission. Transmission reviewed.    Optivol Thoracic impedance suggesting corrected back to baseline on 2/21 and now started trending below baseline 2/23 suggesting the return of possible fluid accumulation.   Prescribed:  Furosemide 40 mg take 2 tablets (80 mg total) every morning and 1 tablet (40 mg total) every evening.   Labs: 01/26/2021 Creatinine 0.59, Potassium 4.2, Sodium 141, GFR 106 A complete set of results can be found in Results Review.   Recommendations:  Left voice mail with ICM number and encouraged to call if experiencing any fluid symptoms.   Follow-up plan: ICM clinic phone appointment on 09/27/2021 to recheck fluid levels.   91 day device clinic remote transmission 10/03/2021.     EP/Cardiology Office Visits:   09/29/2021 with Dr Caryl Comes.   Copy of ICM check sent to Dr. Caryl Comes.     3 month ICM trend: 09/19/2021.    12-14 Month ICM trend:     [name], RN 09/20/2021 1:20 PM

## 2021-09-21 NOTE — Progress Notes (Signed)
Spoke with patient.  Pt ate foods this past weekend that were high in salt which resulted in feeling bloated.  She took extra Furosemide today and will take one tomorrow if needed.  Will recheck fluid levels 3/7.

## 2021-09-29 ENCOUNTER — Encounter: Payer: Self-pay | Admitting: Internal Medicine

## 2021-09-29 ENCOUNTER — Other Ambulatory Visit: Payer: Self-pay

## 2021-09-29 ENCOUNTER — Ambulatory Visit (INDEPENDENT_AMBULATORY_CARE_PROVIDER_SITE_OTHER): Payer: Medicare Other | Admitting: Internal Medicine

## 2021-09-29 VITALS — BP 104/78 | HR 70 | Ht 63.0 in | Wt 189.0 lb

## 2021-09-29 DIAGNOSIS — I428 Other cardiomyopathies: Secondary | ICD-10-CM

## 2021-09-29 DIAGNOSIS — I447 Left bundle-branch block, unspecified: Secondary | ICD-10-CM

## 2021-09-29 DIAGNOSIS — Z95810 Presence of automatic (implantable) cardiac defibrillator: Secondary | ICD-10-CM

## 2021-09-29 DIAGNOSIS — I5022 Chronic systolic (congestive) heart failure: Secondary | ICD-10-CM | POA: Diagnosis not present

## 2021-09-29 NOTE — Progress Notes (Signed)
Patient Care Team: Harvie Heck, MD as PCP - Smitty Knudsen, MD as PCP - Electrophysiology (Cardiology) Juluis Rainier as Consulting Physician (Optometry)   HPI  Susan Peck is a 56 y.o. female   Seen in followup for ICD-CRT  implanted 5/18 for primary prevention for NICM with interval normalization of LV function  DATE TEST EF   9/12 Echo  25-30%   2/17 Cath 35 % Normal CA  2/17 Echo 30-35 %   3/18 Echo  20-25%   3/19 Echo  45-50%   6/21 Echo  55-60%     she has had hx of diaphragmatic stimulation requiring reprogramming; we have reprogrammed this and she comes back again with similar complaints.   Complains of some dyspnea on exertion.  No chest pain.  Has had some palpitations infrequently associated with neck pulsations and lightheadedness.  Intermittent problems with back pain on the left shoulder.  Date Cr K Mg  8/18  0.57 4.3   3/19 1.07 4.2 1.8  7/19 0.59 3.7   1/19     6/21 0.55 3.6   7/22 0.59 4.2      Past Medical History:  Diagnosis Date   AICD (automatic cardioverter/defibrillator) present 12/13/2016   Anxiety    no meds   Asthma    CHF (congestive heart failure) ([place])    Depression    no meds   Diabetes mellitus    recent dx 11/17/11 - started med 11/18/11 type 2   GERD (gastroesophageal reflux disease)    Goiter 07/27/2011   non-neoplastic goiter - fine needle aspiration - benign sees dr Dwyane Dee for   Heart murmur    dx 2 yrs ago per pt   Herpes genitalis in women    Hypertension    IBS (irritable bowel syndrome)    tx with diet per pt   Low back pain    history   Obesity    Seasonal allergies    Shortness of breath    occasional - exercise induced   Sleep apnea    cpap broken   Systolic heart failure    May 2011 EF 35-40%, 04/03/11 EF 25-30%, 06/27/11 EF 30-35%    Past Surgical History:  Procedure Laterality Date   ABDOMINAL HYSTERECTOMY     BIV ICD INSERTION CRT-D N/A 12/13/2016   Procedure: BiV ICD Insertion  CRT-D;  Surgeon: Deboraha Sprang, MD;  Location: [facility];  Service: Cardiovascular;  Laterality: N/A;   cardiac catherization  2004   [place], [place] - Dr Lynnell Jude   CARDIAC CATHETERIZATION     CARDIAC CATHETERIZATION N/A 09/15/2015   Procedure: Right/Left Heart Cath and Coronary Angiography;  Surgeon: Jolaine Artist, MD;  Location: [facility];  Service: Cardiovascular;  Laterality: N/A;   COLONOSCOPY WITH PROPOFOL N/A 03/26/2015   Procedure: COLONOSCOPY WITH PROPOFOL;  Surgeon: Carol Ada, MD;  Location: [facility];  Service: Endoscopy;  Laterality: N/A;   colonscopy     CYSTOSCOPY  11/23/2011   Procedure: CYSTOSCOPY;  Surgeon: Jolayne Haines, MD;  Location: [facility];  Service: Gynecology;  Laterality: N/A;   DIAGNOSTIC LAPAROSCOPY     of pelvis   DILATION AND CURETTAGE OF UTERUS  12/2006,  10/2005   hysteroscopy surgery x 2   ESOPHAGOGASTRODUODENOSCOPY (EGD) WITH PROPOFOL N/A 11/12/2020   Procedure: ESOPHAGOGASTRODUODENOSCOPY (EGD) WITH PROPOFOL;  Surgeon: Carol Ada, MD;  Location: [facility];  Service: Endoscopy;  Laterality: N/A;   INSERTION  OF ICD  12/13/2016   BIV   LAPAROSCOPIC ASSISTED VAGINAL HYSTERECTOMY  11/23/2011   Procedure: LAPAROSCOPIC ASSISTED VAGINAL HYSTERECTOMY;  Surgeon: Jolayne Haines, MD;  Location: [facility];  Service: Gynecology;  Laterality: N/A;   NOVASURE ABLATION     10/2005   SALPINGOOPHORECTOMY  11/23/2011   Procedure: SALPINGO OOPHERECTOMY;  Surgeon: Jolayne Haines, MD;  Location: [facility];  Service: Gynecology;  Laterality: Bilateral;   SAVORY DILATION N/A 11/12/2020   Procedure: SAVORY DILATION;  Surgeon: Carol Ada, MD;  Location: [facility];  Service: Endoscopy;  Laterality: N/A;   SVD     x 3   TOOTH EXTRACTION N/A 04/05/2017   Procedure: DENTAL EXTRACTIONS OF TEETH FIVE, SEVEN, SIXTEEN, SEVENTEEN;  Surgeon: Diona Browner, DDS;  Location: [facility];  Service: Oral Surgery;  Laterality: N/A;   TUBAL LIGATION     WISDOM TOOTH EXTRACTION       Current Outpatient Medications  Medication Sig Dispense Refill   aspirin 81 MG chewable tablet Chew 81 mg by mouth daily.      Aspirin-Acetaminophen-Caffeine (GOODY HEADACHE PO) Take 1 packet by mouth daily as needed (pain).     aspirin-sod bicarb-citric acid (ALKA-SELTZER) 325 MG TBEF tablet Take 650 mg by mouth every 6 (six) hours as needed (indigestion).     atorvastatin (LIPITOR) 40 MG tablet TAKE 1 TABLET BY MOUTH EVERY DAY 90 tablet 3   BD PEN NEEDLE NANO 2ND GEN 32G X 4 MM MISC USE TO INJECT XULTOPHY DAILY 100 each 5   calcium carbonate (TUMS - DOSED IN MG ELEMENTAL CALCIUM) 500 MG chewable tablet Chew 2,000 mg by mouth daily as needed for indigestion or heartburn.     carvedilol (COREG) 25 MG tablet TAKE 1 TABLET (25 MG TOTAL) BY MOUTH 2 (TWO) TIMES DAILY WITH A MEAL. 180 tablet 3   clotrimazole (LOTRIMIN) 1 % cream Apply 1 application topically 2 (two) times daily. 28 g 0   diclofenac Sodium (VOLTAREN) 1 % GEL APPLY 2 GRAMS TO AFFECTED AREA 4 TIMES A DAY 100 g 1   ENTRESTO 97-103 MG TAKE 1 TABLET BY MOUTH TWICE A DAY 60 tablet 11   eplerenone (INSPRA) 25 MG tablet Take 25 mg by mouth daily.     fluticasone (FLONASE) 50 MCG/ACT nasal spray PLACE 1 SPRAY INTO BOTH NOSTRILS 2 (TWO) TIMES DAILY 48 mL 1   furosemide (LASIX) 40 MG tablet TAKE 2 TABS EVERY MORNING AND 1 TAB EVERY EVENING. 270 tablet 0   glucose blood (ONETOUCH VERIO) test strip Insulin dependent. Check blood sugar up to 3 times daily. diag code E11.65 100 each 12   Lancet Devices (ONETOUCH DELICA PLUS LANCING) MISC USE AS DIRECTED TO CHECK BLOOD SUGAR 1 each 2   linaclotide (LINZESS) 145 MCG CAPS capsule Take 145 mcg by mouth daily before breakfast.     mupirocin cream (BACTROBAN) 2 % APPLY TO AFFECTED AREA TWICE A DAY 15 g 1   ONETOUCH DELICA LANCETS FINE MISC Insulin dependent. Check blood sugar up to 3 times daily. diag code E11.65 100 each 12   PATADAY 0.2 % SOLN Place 1 drop into both eyes daily.  4   polyethylene  glycol (MIRALAX / GLYCOLAX) 17 g packet Take 17 g by mouth daily as needed.     VYZULTA 0.024 % SOLN Place 1 drop into both eyes at bedtime.     [employer] SENNA PLUS 8.6-50 MG tablet TAKE 1 TABLET BY MOUTH EVERY DAY (Patient not taking: Reported on 09/29/2021)  30 tablet 3   DULoxetine (CYMBALTA) 30 MG capsule TAKE 1 CAPSULE (30 MG TOTAL) BY MOUTH DAILY FOR 14 DAYS, THEN 2 CAPSULES (60 MG TOTAL) DAILY. (Patient not taking: Reported on 09/29/2021) 90 capsule 1   methocarbamol (ROBAXIN) 500 MG tablet Take 1 tablet (500 mg total) by mouth 3 (three) times daily as needed for muscle spasms. (Patient not taking: Reported on 09/29/2021) 60 tablet 0   XULTOPHY 100-3.6 UNIT-MG/ML SOPN INJECT 12 UNITS/DAY INTO THE SKIN DAILY IN THE AFTERNOON. 3 mL 11   No current facility-administered medications for this visit.    Allergies  Allergen Reactions   Shellfish-Derived Products Anaphylaxis      Review of Systems negative except from HPI and PMH  Physical Exam BP 104/78    Pulse 70    Ht '5\' 3"'$  (1.6 m)    Wt 189 lb (85.7 kg)    SpO2 96%    BMI 33.48 kg/m  Well developed and well nourished in no acute distress HENT normal Neck supple with JVP-flat Clear Device pocket well healed; without hematoma or erythema.  There is no tethering  Regular rate and rhythm, no  gallop No  murmur Abd-soft with active BS No Clubbing cyanosis  edema Skin-warm and dry A & Oriented  Grossly normal sensory and motor function  ECG sinus rhythm with P synchronous pacing with an upright QRS lead V1 and negative QRS lead I  QRSd approximately 160 ms   8/18 QRSd 140 ms  Assessment and  Plan  NICM interval normalization  CHF chronic diastolic  Hypertension  Diaphragmatic stimulation  CRT- D [employer]     Palpitations   Euvolemic.  Continue eplerenone 25 mg daily and Lasix 80/40  Cardiomyopathy has intervally normalized.  We will plan a repeat echo to confirm.  Continue carvedilol 25 twice daily and Entresto 97/103 --she  may be a candidate for an SGLT2  Diaphragmatic stimulation is driven and holds 1-3.  Currently with LV pacing 2-3, there is no diaphragmatic stimulation, suspect there is micro lead motion  Blood pressure is well controlled.  Continue on her heart failure medications.  Her palpitations sound as if they were ventricular.  We will create a low rate monitor zone  Chest pain is unlikely cardiac.     Current medicines are reviewed at length with the patient today .  The patient does not  have concerns regarding medicines.

## 2021-09-29 NOTE — Patient Instructions (Signed)

## 2021-09-30 ENCOUNTER — Other Ambulatory Visit: Payer: Self-pay | Admitting: Internal Medicine

## 2021-09-30 MED ORDER — VYZULTA 0.024 % OP SOLN: 1.0000 [drp] | Freq: Every day | OPHTHALMIC | 1 refills | Status: AC

## 2021-10-03 ENCOUNTER — Ambulatory Visit (INDEPENDENT_AMBULATORY_CARE_PROVIDER_SITE_OTHER): Payer: Medicare Other

## 2021-10-03 DIAGNOSIS — I428 Other cardiomyopathies: Secondary | ICD-10-CM

## 2021-10-04 LAB — CUP PACEART REMOTE DEVICE CHECK
Battery Remaining Longevity: 27 mo
Battery Voltage: 2.92 V
Brady Statistic AP VP Percent: 1.24 %
Brady Statistic AP VS Percent: 0.01 %
Brady Statistic AS VP Percent: 98.7 %
Brady Statistic AS VS Percent: 0.05 %
Brady Statistic RA Percent Paced: 1.25 %
Brady Statistic RV Percent Paced: 99.82 %
Date Time Interrogation Session: 20230314044225
HighPow Impedance: 73 Ohm
Implantable Lead Implant Date: 20180524
Implantable Lead Implant Date: 20180524
Implantable Lead Implant Date: 20180524
Implantable Lead Location: 753858
Implantable Lead Location: 753859
Implantable Lead Location: 753860
Implantable Lead Model: 4398
Implantable Lead Model: 5076
Implantable Pulse Generator Implant Date: 20180524
Lead Channel Impedance Value: 145.871
Lead Channel Impedance Value: 159.6 Ohm
Lead Channel Impedance Value: 161.5 Ohm
Lead Channel Impedance Value: 178.5 Ohm
Lead Channel Impedance Value: 178.5 Ohm
Lead Channel Impedance Value: 266 Ohm
Lead Channel Impedance Value: 323 Ohm
Lead Channel Impedance Value: 323 Ohm
Lead Channel Impedance Value: 361 Ohm
Lead Channel Impedance Value: 399 Ohm
Lead Channel Impedance Value: 399 Ohm
Lead Channel Impedance Value: 456 Ohm
Lead Channel Impedance Value: 475 Ohm
Lead Channel Impedance Value: 532 Ohm
Lead Channel Impedance Value: 570 Ohm
Lead Channel Impedance Value: 608 Ohm
Lead Channel Impedance Value: 627 Ohm
Lead Channel Impedance Value: 665 Ohm
Lead Channel Pacing Threshold Amplitude: 0.5 V
Lead Channel Pacing Threshold Amplitude: 0.625 V
Lead Channel Pacing Threshold Amplitude: 0.625 V
Lead Channel Pacing Threshold Pulse Width: 0.4 ms
Lead Channel Pacing Threshold Pulse Width: 0.4 ms
Lead Channel Pacing Threshold Pulse Width: 0.4 ms
Lead Channel Sensing Intrinsic Amplitude: 2.5 mV
Lead Channel Sensing Intrinsic Amplitude: 2.5 mV
Lead Channel Sensing Intrinsic Amplitude: 31.625 mV
Lead Channel Sensing Intrinsic Amplitude: 31.625 mV
Lead Channel Setting Pacing Amplitude: 1 V
Lead Channel Setting Pacing Amplitude: 1.5 V
Lead Channel Setting Pacing Amplitude: 2.5 V
Lead Channel Setting Pacing Pulse Width: 0.4 ms
Lead Channel Setting Pacing Pulse Width: 0.4 ms
Lead Channel Setting Sensing Sensitivity: 0.3 mV

## 2021-10-05 ENCOUNTER — Ambulatory Visit (INDEPENDENT_AMBULATORY_CARE_PROVIDER_SITE_OTHER): Payer: Medicare Other

## 2021-10-05 ENCOUNTER — Telehealth: Payer: Self-pay

## 2021-10-05 DIAGNOSIS — Z95810 Presence of automatic (implantable) cardiac defibrillator: Secondary | ICD-10-CM

## 2021-10-05 DIAGNOSIS — I5022 Chronic systolic (congestive) heart failure: Secondary | ICD-10-CM

## 2021-10-05 NOTE — Progress Notes (Signed)
? ?  Susan Peck [birth year] [id number] ? ? ?History:  56 y.o. [id number] presents for annual exam. Postmenopausal - no HRT. S/P 2013 TVH BSO for chronic pelvic pain. Normal pap and mammogram history. History of T2DM, CHF, ICD, COPD.  ? ?Gynecologic History ?No LMP recorded. Patient has had a hysterectomy. ?  ?Contraception/Family planning: status post hysterectomy ?Sexually active: Yes ? ?Health Maintenance ?Last Pap: 01/31/2011. Normal ?Last mammogram: 12/01/2020. Results were: Normal ?Last colonoscopy: 2016. Results were: Normal, 10-year recall ?Last Dexa: 06/2019. Results were: Normal ? ?Past medical history, past surgical history, family history and social history were all reviewed and documented in the EPIC chart. Boyfriend. 3 children, 8 grandchildren. Mother deceased in her 14s from breast cancer.  ? ?ROS:  A ROS was performed and pertinent positives and negatives are included. ? ?Exam: ? ?Vitals:  ? 10/06/21 1101  ?BP: 134/84  ?Weight: 192 lb (87.1 kg)  ?Height: '5\' 3"'$  (1.6 m)  ? ? ?Body mass index is 34.01 kg/m?. ? ?General appearance:  Normal ?Thyroid:  Right goiter ?Respiratory ? Auscultation:  Clear without wheezing or rhonchi ?Cardiovascular ? Auscultation:  Regular rate, without rubs, murmurs or gallops ? Edema/varicosities:  Not grossly evident ?Abdominal ? Soft,nontender, without masses, guarding or rebound. ? Liver/spleen:  No organomegaly noted ? Hernia:  None appreciated ? Skin ? Inspection:  Grossly normal ?  ?Breasts: Examined lying and sitting.  ? Right: Without masses, retractions, discharge or axillary adenopathy. ? ? Left: Without masses, retractions, discharge or axillary adenopathy. ?Gentitourinary  ? Inguinal/mons:  Normal without inguinal adenopathy ? External genitalia:  Normal ? BUS/Urethra/Skene's glands:  Normal ? Vagina:  Normal ? Cervix:  Absent ? Uterus:  Absent ? Adnexa/parametria:   ?  Rt: Without masses or tenderness. ?  Lt: Without masses or tenderness. ? Anus and  perineum: Normal ? Digital rectal exam: Normal sphincter tone without palpated masses or tenderness ? ?Patient informed chaperone available to be present for breast and pelvic exam. Patient has requested no chaperone to be present. Patient has been advised what will be completed during breast and pelvic exam.  ? ?Assessment/Plan:  56 y.o. [id number] for annual exam.  ? ?Well female exam with routine gynecological exam - Education provided on SBEs, importance of preventative screenings, current guidelines, high calcium diet, regular exercise, and multivitamin daily. Labs with PCP. ? ?Postmenopausal - no HRT. S/P 2013 TVH BSO for pelvic pain ? ?Screen for STD (sexually transmitted disease) - Plan: SURESWAB CT/NG/T. vaginalis, RPR, HIV Antibody (routine testing w rflx). ? ?Screening for cervical cancer - Normal Pap history.  No longer screening per guidelines. ? ?Screening for breast cancer - Normal mammogram history. Continue annual screenings. Mother deceased in her 36s from breast cancer.  Normal breast exam today. ? ?Screening for colon cancer - 2016 colonoscopy. Will repeat at GI's recommended interval.  ? ?Screening for osteoporosis - Normal bone density in 2020. Will repeat in 2025. Continue Vitamin D + Calcium and increase exercise.  ? ?Return in 1 year for annual.  ? ? ? ?[facility], 11:35 AM 10/06/2021 ? ?

## 2021-10-05 NOTE — Progress Notes (Signed)
EPIC Encounter for ICM Monitoring ? ?Patient Name: Susan Peck is a 56 y.o. female ?Date: 10/05/2021 ?Primary Care Physican: Harvie Heck, MD ?Primary Cardiologist: Caryl Comes (Bensimhon as needed) ?Electrophysiologist: Caryl Comes ?Bi-V Pacing: 99.9%  ?05/13/2021 Weight: 194 lbs ?  ?  ?Attempted call to patient and unable to reach.  Left detailed message per DPR regarding transmission. Transmission reviewed.  ?  ?Optivol Thoracic impedance suggesting possible fluid accumulation starting 3/9. ?  ?Prescribed:  ?Furosemide 40 mg take 2 tablets (80 mg total) every morning and 1 tablet (40 mg total) every evening.  Pt self adjust Furosemide when needed.  ?  ?Labs: ?01/26/2021 Creatinine 0.59, Potassium 4.2, Sodium 141, GFR 106 ?A complete set of results can be found in Results Review. ?  ?Recommendations:  Left voice mail with ICM number and encouraged to call if experiencing any fluid symptoms. ?  ?Follow-up plan: ICM clinic phone appointment on 10/12/2021 for 31 day & to recheck fluid levels.   91 day device clinic remote transmission 01/02/2022.   ?  ?EP/Cardiology Office Visits:    Next visit due 10/31/2022 with Dr Caryl Comes (no recall). ?  ?Copy of ICM check sent to Dr. Caryl Comes.    ? ?3 month ICM trend: 10/04/2021. ? ? ? ?12-14 Month ICM trend:  ? ? ? ?[name], RN ?10/05/2021 ?10:19 AM ? ?

## 2021-10-05 NOTE — Telephone Encounter (Signed)
Remote ICM transmission received.  Attempted call to patient regarding ICM remote transmission and left detailed message per DPR.  Advised to return call for any fluid symptoms or questions. Next ICM remote transmission scheduled 10/12/2021.   ? ?

## 2021-10-06 ENCOUNTER — Ambulatory Visit (INDEPENDENT_AMBULATORY_CARE_PROVIDER_SITE_OTHER): Payer: Medicare Other | Admitting: Nurse Practitioner

## 2021-10-06 ENCOUNTER — Encounter: Payer: Self-pay | Admitting: Nurse Practitioner

## 2021-10-06 ENCOUNTER — Other Ambulatory Visit: Payer: Self-pay

## 2021-10-06 VITALS — BP 134/84 | Ht 63.0 in | Wt 192.0 lb

## 2021-10-06 DIAGNOSIS — B009 Herpesviral infection, unspecified: Secondary | ICD-10-CM | POA: Diagnosis not present

## 2021-10-06 DIAGNOSIS — Z780 Asymptomatic menopausal state: Secondary | ICD-10-CM

## 2021-10-06 DIAGNOSIS — Z01419 Encounter for gynecological examination (general) (routine) without abnormal findings: Secondary | ICD-10-CM

## 2021-10-06 DIAGNOSIS — Z113 Encounter for screening for infections with a predominantly sexual mode of transmission: Secondary | ICD-10-CM | POA: Diagnosis not present

## 2021-10-06 DIAGNOSIS — Z9189 Other specified personal risk factors, not elsewhere classified: Secondary | ICD-10-CM | POA: Diagnosis not present

## 2021-10-07 LAB — SURESWAB CT/NG/T. VAGINALIS
C. trachomatis RNA, TMA: NOT DETECTED
N. gonorrhoeae RNA, TMA: NOT DETECTED
Trichomonas vaginalis RNA: NOT DETECTED

## 2021-10-07 LAB — HIV ANTIBODY (ROUTINE TESTING W REFLEX): HIV 1&2 Ab, 4th Generation: NONREACTIVE

## 2021-10-07 LAB — RPR: RPR Ser Ql: NONREACTIVE

## 2021-10-13 ENCOUNTER — Telehealth: Payer: Self-pay

## 2021-10-13 NOTE — Telephone Encounter (Signed)
I spoke with the patient and she agreed to send missed ICM transmission later this evening. ?

## 2021-10-14 NOTE — Progress Notes (Signed)
No ICM remote transmission received for 10/12/2021 and next ICM transmission scheduled for 11/07/2021.   ?

## 2021-10-17 NOTE — Progress Notes (Signed)
Remote ICD transmission.   

## 2021-10-18 ENCOUNTER — Ambulatory Visit (INDEPENDENT_AMBULATORY_CARE_PROVIDER_SITE_OTHER): Payer: Medicare Other

## 2021-10-18 ENCOUNTER — Telehealth: Payer: Self-pay

## 2021-10-18 DIAGNOSIS — I5022 Chronic systolic (congestive) heart failure: Secondary | ICD-10-CM | POA: Diagnosis not present

## 2021-10-18 DIAGNOSIS — Z95810 Presence of automatic (implantable) cardiac defibrillator: Secondary | ICD-10-CM | POA: Diagnosis not present

## 2021-10-18 NOTE — Telephone Encounter (Signed)
Remote ICM transmission received.  Attempted call to patient regarding ICM remote transmission and left detailed message per DPR.  Advised to return call for any fluid symptoms or questions. Next ICM remote transmission scheduled 10/31/2021.   ? ?

## 2021-10-18 NOTE — Progress Notes (Signed)
Attempted to return pt call.  ?

## 2021-10-18 NOTE — Progress Notes (Signed)
Spoke with patient and heart failure questions reviewed.  Pt has some ankle swelling at night.  She thinks she is drinking too much fluid because her mouth stays dry.  Advised to limit fluid intake to 64 oz daily and encouraged to limit salt intake also.  Will recheck fluid levels on 4/10.  ?

## 2021-10-18 NOTE — Progress Notes (Signed)
EPIC Encounter for ICM Monitoring ? ?Patient Name: Susan Peck is a 56 y.o. female ?Date: 10/18/2021 ?Primary Care Physican: Harvie Heck, MD ?Primary Cardiologist: Caryl Comes (Bensimhon as needed) ?Electrophysiologist: Caryl Comes ?Bi-V Pacing: 99.9%  ?05/13/2021 Weight: 194 lbs ?  ?  ?Attempted call to patient and unable to reach.  Left detailed message per DPR regarding transmission. Transmission reviewed.  ?  ?Optivol Thoracic impedance suggesting possible fluid accumulation returned to normal but started trending below baseline again 3/21. ?  ?Prescribed:  ?Furosemide 40 mg take 2 tablets (80 mg total) every morning and 1 tablet (40 mg total) every evening.  Pt self adjust Furosemide when needed.  ?  ?Labs: ?01/26/2021 Creatinine 0.59, Potassium 4.2, Sodium 141, GFR 106 ?A complete set of results can be found in Results Review. ?  ?Recommendations:  Left voice mail with ICM number and encouraged to call if experiencing any fluid symptoms. ?  ?Follow-up plan: ICM clinic phone appointment on 10/31/2021 to recheck fluid levels.   91 day device clinic remote transmission 01/02/2022.   ?  ?EP/Cardiology Office Visits:    Next visit due 10/31/2022 with Dr Caryl Comes (no recall). ?  ?Copy of ICM check sent to Dr. Caryl Comes.    ? ?3 month ICM trend: 10/15/2021. ? ? ? ?12-14 Month ICM trend:  ? ? ? ?[name], RN ?10/18/2021 ?12:41 PM ? ?

## 2021-10-31 ENCOUNTER — Ambulatory Visit (INDEPENDENT_AMBULATORY_CARE_PROVIDER_SITE_OTHER): Payer: Medicare Other

## 2021-10-31 DIAGNOSIS — Z95810 Presence of automatic (implantable) cardiac defibrillator: Secondary | ICD-10-CM

## 2021-10-31 DIAGNOSIS — I5022 Chronic systolic (congestive) heart failure: Secondary | ICD-10-CM

## 2021-11-01 ENCOUNTER — Telehealth: Payer: Self-pay

## 2021-11-01 NOTE — Telephone Encounter (Signed)
Remote ICM transmission received.  Attempted call to patient regarding ICM remote transmission and left detailed message per DPR.  Advised to return call for any fluid symptoms or questions. Next ICM remote transmission scheduled 11/21/2021.   ? ?

## 2021-11-01 NOTE — Progress Notes (Signed)
EPIC Encounter for ICM Monitoring ? ?Patient Name: Susan Peck is a 56 y.o. female ?Date: 11/01/2021 ?Primary Care Physican: Harvie Heck, MD ?Primary Cardiologist: Caryl Comes (Bensimhon as needed) ?Electrophysiologist: Caryl Comes ?Bi-V Pacing: 99.9%  ?05/13/2021 Weight: 194 lbs ?  ?  ?Attempted call to patient and unable to reach.  Left detailed message per DPR regarding transmission. Transmission reviewed.  ?  ?Optivol Thoracic impedance suggesting fluid levels trending back to baseline.  Impedance pattern suggesting several days of possible fluid accumulation alternating with return to baseline for 1-2 days. ?  ?Prescribed:  ?Furosemide 40 mg take 2 tablets (80 mg total) every morning and 1 tablet (40 mg total) every evening.  Pt self adjust Furosemide when needed.  ?  ?Labs: ?01/26/2021 Creatinine 0.59, Potassium 4.2, Sodium 141, GFR 106 ?A complete set of results can be found in Results Review. ?  ?Recommendations:  Left voice mail with ICM number and encouraged to call if experiencing any fluid symptoms. ?  ?Follow-up plan: ICM clinic phone appointment on 11/21/2021.   91 day device clinic remote transmission 01/02/2022.   ?  ?EP/Cardiology Office Visits:    Next visit due 10/31/2022 with Dr Caryl Comes (no recall). ?  ?Copy of ICM check sent to Dr. Caryl Comes. ? ?3 month ICM trend: 10/31/2021. ? ? ? ?12-14 Month ICM trend:  ? ? ? ?[name], RN ?11/01/2021 ?3:34 PM ? ?

## 2021-11-21 ENCOUNTER — Ambulatory Visit (INDEPENDENT_AMBULATORY_CARE_PROVIDER_SITE_OTHER): Payer: Medicare Other

## 2021-11-21 DIAGNOSIS — Z95810 Presence of automatic (implantable) cardiac defibrillator: Secondary | ICD-10-CM

## 2021-11-21 DIAGNOSIS — I5022 Chronic systolic (congestive) heart failure: Secondary | ICD-10-CM

## 2021-11-25 ENCOUNTER — Telehealth: Payer: Self-pay

## 2021-11-25 ENCOUNTER — Other Ambulatory Visit: Payer: Self-pay | Admitting: *Deleted

## 2021-11-25 DIAGNOSIS — E1165 Type 2 diabetes mellitus with hyperglycemia: Secondary | ICD-10-CM

## 2021-11-25 MED ORDER — XULTOPHY 100-3.6 UNIT-MG/ML ~~LOC~~ SOPN: 12.0000 [IU]/d | PEN_INJECTOR | Freq: Every day | SUBCUTANEOUS | 11 refills | Status: DC

## 2021-11-25 NOTE — Progress Notes (Signed)
EPIC Encounter for ICM Monitoring ? ?Patient Name: Susan Peck is a 56 y.o. female ?Date: 11/25/2021 ?Primary Care Physican: Harvie Heck, MD ?Primary Cardiologist: Caryl Comes (Bensimhon as needed) ?Electrophysiologist: Caryl Comes ?Bi-V Pacing: 100%  ?10/06/2021 Office Weight: 192 lbs ?  ?  ?Attempted call to patient and unable to reach.  Left detailed message per DPR regarding transmission. Transmission reviewed.  ?  ?Optivol Thoracic impedance suggesting normal fluid levels. ?  ?Prescribed:  ?Furosemide 40 mg take 2 tablets (80 mg total) every morning and 1 tablet (40 mg total) every evening.  Pt self adjust Furosemide when needed.  ?  ?Labs: ?01/26/2021 Creatinine 0.59, Potassium 4.2, Sodium 141, GFR 106 ?A complete set of results can be found in Results Review. ?  ?Recommendations:  Left voice mail with ICM number and encouraged to call if experiencing any fluid symptoms. ?  ?Follow-up plan: ICM clinic phone appointment on 12/26/2021.   91 day device clinic remote transmission 01/02/2022.   ?  ?EP/Cardiology Office Visits:    Next visit due 10/31/2022 with Dr Caryl Comes (no recall). ?  ?Copy of ICM check sent to Dr. Caryl Comes ? ?3 month ICM trend: 11/22/2021. ? ? ? ?12-14 Month ICM trend:  ? ? ? ?[name], RN ?11/25/2021 ?4:31 PM ? ?

## 2021-11-25 NOTE — Telephone Encounter (Signed)
Remote ICM transmission received.  Attempted call to patient regarding ICM remote transmission and left detailed message per DPR.  Advised to return call for any fluid symptoms or questions. Next ICM remote transmission scheduled 12/26/2021.   ? ?

## 2021-12-10 ENCOUNTER — Other Ambulatory Visit: Payer: Self-pay | Admitting: Internal Medicine

## 2021-12-10 DIAGNOSIS — I5022 Chronic systolic (congestive) heart failure: Secondary | ICD-10-CM

## 2021-12-26 ENCOUNTER — Ambulatory Visit (INDEPENDENT_AMBULATORY_CARE_PROVIDER_SITE_OTHER): Payer: Medicare Other

## 2021-12-26 DIAGNOSIS — I5022 Chronic systolic (congestive) heart failure: Secondary | ICD-10-CM | POA: Diagnosis not present

## 2021-12-26 DIAGNOSIS — Z95810 Presence of automatic (implantable) cardiac defibrillator: Secondary | ICD-10-CM | POA: Diagnosis not present

## 2021-12-29 NOTE — Progress Notes (Signed)
EPIC Encounter for ICM Monitoring  Patient Name: Susan Peck is a 56 y.o. female Date: 12/29/2021 Primary Care Physican: Harvie Heck, MD Primary Cardiologist: Caryl Comes (Bensimhon as needed) Electrophysiologist: Vergie Living Pacing: 100%  10/06/2021 Office Weight: 192 lbs 12/28/2021 Weight: 190 lbs     Spoke with patient and heart failure questions reviewed.  Pt asymptomatic for fluid accumulation.  Reports feeling well at this time and voices no complaints.    Optivol Thoracic impedance suggesting normal fluid levels.   Prescribed:  Furosemide 40 mg take 2 tablets (80 mg total) every morning and 1 tablet (40 mg total) every evening.  Pt self adjust Furosemide when needed.    Labs: 01/26/2021 Creatinine 0.59, Potassium 4.2, Sodium 141, GFR 106 A complete set of results can be found in Results Review.   Recommendations:  No changes and encouraged to call if experiencing any fluid symptoms.   Follow-up plan: ICM clinic phone appointment on 01/30/2022.   91 day device clinic remote transmission 04/03/2022.     EP/Cardiology Office Visits:    Next visit due 10/31/2022 with Dr Caryl Comes (no recall).   Copy of ICM check sent to Dr. Caryl Comes  3 month ICM trend: 12/29/2021.    12-14 Month ICM trend:     [name], RN 12/29/2021 12:50 PM

## 2022-01-01 ENCOUNTER — Other Ambulatory Visit: Payer: Self-pay | Admitting: Internal Medicine

## 2022-01-01 DIAGNOSIS — E1165 Type 2 diabetes mellitus with hyperglycemia: Secondary | ICD-10-CM

## 2022-01-02 ENCOUNTER — Ambulatory Visit (INDEPENDENT_AMBULATORY_CARE_PROVIDER_SITE_OTHER): Payer: Medicare Other

## 2022-01-02 DIAGNOSIS — I5022 Chronic systolic (congestive) heart failure: Secondary | ICD-10-CM

## 2022-01-04 LAB — CUP PACEART REMOTE DEVICE CHECK
Battery Remaining Longevity: 25 mo
Battery Voltage: 2.93 V
Brady Statistic AP VP Percent: 0.05 %
Brady Statistic AP VS Percent: 0.01 %
Brady Statistic AS VP Percent: 99.91 %
Brady Statistic AS VS Percent: 0.03 %
Brady Statistic RA Percent Paced: 0.06 %
Brady Statistic RV Percent Paced: 99.94 %
Date Time Interrogation Session: 20230613042406
HighPow Impedance: 69 Ohm
Implantable Lead Implant Date: 20180524
Implantable Lead Implant Date: 20180524
Implantable Lead Implant Date: 20180524
Implantable Lead Location: 753858
Implantable Lead Location: 753859
Implantable Lead Location: 753860
Implantable Lead Model: 4398
Implantable Lead Model: 5076
Implantable Pulse Generator Implant Date: 20180524
Lead Channel Impedance Value: 170.472
Lead Channel Impedance Value: 170.472
Lead Channel Impedance Value: 182.205
Lead Channel Impedance Value: 182.205
Lead Channel Impedance Value: 193.707
Lead Channel Impedance Value: 323 Ohm
Lead Channel Impedance Value: 323 Ohm
Lead Channel Impedance Value: 361 Ohm
Lead Channel Impedance Value: 361 Ohm
Lead Channel Impedance Value: 418 Ohm
Lead Channel Impedance Value: 456 Ohm
Lead Channel Impedance Value: 456 Ohm
Lead Channel Impedance Value: 570 Ohm
Lead Channel Impedance Value: 570 Ohm
Lead Channel Impedance Value: 608 Ohm
Lead Channel Impedance Value: 608 Ohm
Lead Channel Impedance Value: 665 Ohm
Lead Channel Impedance Value: 684 Ohm
Lead Channel Pacing Threshold Amplitude: 0.375 V
Lead Channel Pacing Threshold Amplitude: 0.5 V
Lead Channel Pacing Threshold Amplitude: 0.625 V
Lead Channel Pacing Threshold Pulse Width: 0.4 ms
Lead Channel Pacing Threshold Pulse Width: 0.4 ms
Lead Channel Pacing Threshold Pulse Width: 0.4 ms
Lead Channel Sensing Intrinsic Amplitude: 22.625 mV
Lead Channel Sensing Intrinsic Amplitude: 22.625 mV
Lead Channel Sensing Intrinsic Amplitude: 3.5 mV
Lead Channel Sensing Intrinsic Amplitude: 3.5 mV
Lead Channel Setting Pacing Amplitude: 1 V
Lead Channel Setting Pacing Amplitude: 1.5 V
Lead Channel Setting Pacing Amplitude: 2.5 V
Lead Channel Setting Pacing Pulse Width: 0.4 ms
Lead Channel Setting Pacing Pulse Width: 0.4 ms
Lead Channel Setting Sensing Sensitivity: 0.3 mV

## 2022-01-09 MED ORDER — XULTOPHY 100-3.6 UNIT-MG/ML ~~LOC~~ SOPN: 12.0000 [IU]/d | PEN_INJECTOR | Freq: Every day | SUBCUTANEOUS | 11 refills | Status: DC

## 2022-01-09 MED ORDER — PATADAY 0.2 % OP SOLN: 1.0000 [drp] | Freq: Every day | OPHTHALMIC | 4 refills | Status: AC

## 2022-01-18 NOTE — Progress Notes (Signed)
Remote ICD transmission.   

## 2022-01-30 ENCOUNTER — Ambulatory Visit (INDEPENDENT_AMBULATORY_CARE_PROVIDER_SITE_OTHER): Payer: Medicare Other

## 2022-01-30 DIAGNOSIS — Z95810 Presence of automatic (implantable) cardiac defibrillator: Secondary | ICD-10-CM

## 2022-01-30 DIAGNOSIS — I5022 Chronic systolic (congestive) heart failure: Secondary | ICD-10-CM | POA: Diagnosis not present

## 2022-02-01 ENCOUNTER — Telehealth: Payer: Self-pay

## 2022-02-01 NOTE — Telephone Encounter (Signed)
The patient agreed to send missed ICM transmission before 5 pm.

## 2022-02-02 NOTE — Progress Notes (Signed)
EPIC Encounter for ICM Monitoring  Patient Name: Susan Peck is a 56 y.o. female Date: 02/02/2022 Primary Care Physican: Romana Juniper, MD Primary Cardiologist: Caryl Comes (Bensimhon as needed) Electrophysiologist: Vergie Living Pacing: 99.9%  10/06/2021 Office Weight: 192 lbs 12/28/2021 Weight: 190 lbs     Attempted call to patient and unable to reach.  Left detailed message per DPR regarding transmission. Transmission reviewed.    Optivol Thoracic impedance suggesting normal fluid levels.   Prescribed:  Furosemide 40 mg take 2 tablets (80 mg total) every morning and 1 tablet (40 mg total) every evening.  Pt self adjust Furosemide when needed.    Labs: 01/26/2021 Creatinine 0.59, Potassium 4.2, Sodium 141, GFR 106 A complete set of results can be found in Results Review.   Recommendations: Left voice mail with ICM number and encouraged to call if experiencing any fluid symptoms.   Follow-up plan: ICM clinic phone appointment on 03/06/2022.   91 day device clinic remote transmission 04/03/2022.     EP/Cardiology Office Visits:    Next visit due 10/31/2022 with Dr Caryl Comes (no recall).   Copy of ICM check sent to Dr. Caryl Comes  3 month ICM trend: 02/02/2022.    12-14 Month ICM trend:     [name], RN 02/02/2022 4:33 PM

## 2022-02-10 ENCOUNTER — Other Ambulatory Visit: Payer: Self-pay | Admitting: Family Medicine

## 2022-02-10 DIAGNOSIS — Z1231 Encounter for screening mammogram for malignant neoplasm of breast: Secondary | ICD-10-CM

## 2022-02-13 ENCOUNTER — Other Ambulatory Visit (HOSPITAL_BASED_OUTPATIENT_CLINIC_OR_DEPARTMENT_OTHER): Payer: Self-pay

## 2022-02-13 DIAGNOSIS — G4733 Obstructive sleep apnea (adult) (pediatric): Secondary | ICD-10-CM

## 2022-02-23 ENCOUNTER — Encounter: Payer: Medicare Other | Attending: Internal Medicine | Admitting: *Deleted

## 2022-02-23 ENCOUNTER — Encounter: Payer: Self-pay | Admitting: *Deleted

## 2022-02-23 VITALS — BP 142/96 | Ht 64.0 in | Wt 199.0 lb

## 2022-02-23 DIAGNOSIS — E1140 Type 2 diabetes mellitus with diabetic neuropathy, unspecified: Secondary | ICD-10-CM

## 2022-02-23 DIAGNOSIS — E119 Type 2 diabetes mellitus without complications: Secondary | ICD-10-CM | POA: Diagnosis present

## 2022-02-23 NOTE — Patient Instructions (Signed)
Check blood sugars 1 x day before breakfast every day and occasionally 2 hrs after supper Bring blood sugar records to the next appointment  Exercise:  Begin walking  for    10-15  minutes   3  days a week and gradually increase 30 minutes 5 x week  Eat 3 meals day,   1-2  snacks a day Space meals 4-6 hours apart Limit fried foods, desserts/sweets Avoid sugar sweetened drinks (tea, juices) unless treating a low blood sugar  Complete 3 Day Food Record and bring to next appt  Carry fast acting glucose and a snack at all times Rotate injection sites Hold insulin pen in place for 5-10 seconds after injection  Return for appointment on:  Tuesday March 21, 2022 at 3:00 pm with Freda Munro (nurse)

## 2022-02-23 NOTE — Progress Notes (Signed)
Diabetes Self-Management Education  Visit Type: First/Initial  Appt. Start Time: 1515 Appt. End Time: 1630  02/23/2022  [name], identified by name and date of birth, is a 56 y.o. female with a diagnosis of Diabetes: Type 2.   ASSESSMENT  Blood pressure (!) 142/96, height _0  (1.626 m), weight 199 lb (90.3 kg). Body mass index is 34.16 kg/m.   Diabetes Self-Management Education - 02/23/22 1650       Visit Information   Visit Type First/Initial      Initial Visit   Diabetes Type Type 2    Date Diagnosed 5 years ago    Are you currently following a meal plan? No    Are you taking your medications as prescribed? Yes      Health Coping   How would you rate your overall health? Fair      Psychosocial Assessment   Patient Belief/Attitude about Diabetes Other (comment)   "I hate it"   What is the hardest part about your diabetes right now, causing you the most concern, or is the most worrisome to you about your diabetes?   Making healty food and beverage choices    Self-care barriers None    Self-management support Doctor's office;Family;Friends    Other persons present Spouse/SO    Patient Concerns Nutrition/Meal planning;Glycemic Control;Medication;Weight Control;Healthy Lifestyle    Special Needs None    Preferred Learning Style Visual;Hands on    Learning Readiness Ready    How often do you need to have someone help you when you read instructions, pamphlets, or other written materials from your doctor or pharmacy? 1 - Never    What is the last grade level you completed in school? college      Pre-Education Assessment   Patient understands the diabetes disease and treatment process. Needs Review    Patient understands incorporating nutritional management into lifestyle. Needs Instruction    Patient undertands incorporating physical activity into lifestyle. Needs Instruction    Patient understands using medications safely. Needs Review    Patient understands  monitoring blood glucose, interpreting and using results Comprehends key points    Patient understands prevention, detection, and treatment of acute complications. Needs Instruction    Patient understands prevention, detection, and treatment of chronic complications. Needs Review    Patient understands how to develop strategies to address psychosocial issues. Needs Review    Patient understands how to develop strategies to promote health/change behavior. Needs Review      Complications   Last HgB A1C per patient/outside source 7 %   01/26/2022   How often do you check your blood sugar? 1-2 times/day    Fasting Blood glucose range (mg/dL) 70-129;130-179;180-200;>200   FBG's 97-206 mg/dL last 2 weeks - 120 mg/dL today   Number of hypoglycemic episodes per month 0    Have you had a dilated eye exam in the past 12 months? Yes    Have you had a dental exam in the past 12 months? No    Are you checking your feet? Yes    How many days per week are you checking your feet? 7      Dietary Intake   Breakfast skips    Snack (morning) 0-1 snack/day - chips, popcorn    Lunch ramen noodles; tacos, burger and fries, fruit (cherries, watermelon)    Dinner beef, chicken, fish; potatoes, cron, green beans, broccoli, beans, rice, spaghetti, lasagna, mac-n-cheese, asparagus, cauliflower, brussel sprouts, collareds, kale; salads from fast foods  Beverage(s) water, sweet tea, lemonade, fruit juice, ginger ale zero      Activity / Exercise   Activity / Exercise Type ADL's      Patient Education   Previous Diabetes Education Yes (please comment)   [place] [facility]   Disease Pathophysiology Explored patient's options for treatment of their diabetes    Healthy Eating Role of diet in the treatment of diabetes and the relationship between the three main macronutrients and blood glucose level;Food label reading, portion sizes and measuring food.;Reviewed blood glucose goals for pre and post meals and how to evaluate  the patients' food intake on their blood glucose level.;Meal timing in regards to the patients' current diabetes medication.    Being Active Role of exercise on diabetes management, blood pressure control and cardiac health.    Medications Taught/reviewed insulin/injectables, injection, site rotation, insulin/injectables storage and needle disposal.;Reviewed patients medication for diabetes, action, purpose, timing of dose and side effects.    Monitoring Purpose and frequency of SMBG.;Taught/discussed recording of test results and interpretation of SMBG.;Identified appropriate SMBG and/or A1C goals.    Acute complications Taught prevention, symptoms, and  treatment of hypoglycemia - the 15 rule.    Chronic complications Relationship between chronic complications and blood glucose control    Diabetes Stress and Support Identified and addressed patients feelings and concerns about diabetes      Individualized Goals (developed by patient)   Reducing Risk Other (comment)   improve blood sugars, decrease medications, lose weight, lead a healthier lifestyle     Outcomes   Expected Outcomes Demonstrated interest in learning. Expect positive outcomes    Future DMSE 4-6 wks             Individualized Plan for Diabetes Self-Management Training:   Learning Objective:  Patient will have a greater understanding of diabetes self-management. Patient education plan is to attend individual and/or group sessions per assessed needs and concerns.   Plan:   Patient Instructions  Check blood sugars 1 x day before breakfast every day and occasionally 2 hrs after supper Bring blood sugar records to the next appointment  Exercise:  Begin walking  for    10-15  minutes   3  days a week and gradually increase 30 minutes 5 x week  Eat 3 meals day,   1-2  snacks a day Space meals 4-6 hours apart Limit fried foods, desserts/sweets Avoid sugar sweetened drinks (tea, juices) unless treating a low blood  sugar  Complete 3 Day Food Record and bring to next appt  Carry fast acting glucose and a snack at all times Rotate injection sites Hold insulin pen in place for 5-10 seconds after injection  Return for appointment on:  Tuesday March 21, 2022 at 3:00 pm with Freda Munro (nurse)   Expected Outcomes:  Demonstrated interest in learning. Expect positive outcomes  Education material provided:  General Meal Planning Guidelines Simple Meal Plan 3 Day Food Record Glucose tablets Symptoms, causes and treatments of Hypoglycemia   If problems or questions, patient to contact team via:   Johny Drilling, RN, [place] [phone]  Future DSME appointment: 4-6 wks March 21, 2022 with this educator

## 2022-03-06 ENCOUNTER — Ambulatory Visit (INDEPENDENT_AMBULATORY_CARE_PROVIDER_SITE_OTHER): Payer: Medicare Other

## 2022-03-06 DIAGNOSIS — I5022 Chronic systolic (congestive) heart failure: Secondary | ICD-10-CM | POA: Diagnosis not present

## 2022-03-06 DIAGNOSIS — Z95810 Presence of automatic (implantable) cardiac defibrillator: Secondary | ICD-10-CM | POA: Diagnosis not present

## 2022-03-09 ENCOUNTER — Telehealth: Payer: Self-pay

## 2022-03-09 NOTE — Telephone Encounter (Signed)
Remote ICM transmission received.  Attempted call to patient regarding ICM remote transmission and left detailed message per DPR.  Advised to return call for any fluid symptoms or questions. Next ICM remote transmission scheduled 04/10/2022.

## 2022-03-09 NOTE — Progress Notes (Signed)
EPIC Encounter for ICM Monitoring  Patient Name: Susan Peck is a 56 y.o. female Date: 03/09/2022 Primary Care Physican: Romana Juniper, MD Primary Cardiologist: Caryl Comes (Bensimhon as needed) Electrophysiologist: Vergie Living Pacing: 99.9%  12/28/2021 Weight: 190 lbs     Attempted call to patient and unable to reach.  Left detailed message per DPR regarding transmission. Transmission reviewed.    Optivol Thoracic impedance suggesting normal fluid levels.   Prescribed:  Furosemide 40 mg take 2 tablets (80 mg total) every morning and 1 tablet (40 mg total) every evening.  Pt self adjust Furosemide when needed.    Labs: 01/26/2021 Creatinine 0.59, Potassium 4.2, Sodium 141, GFR 106 A complete set of results can be found in Results Review.   Recommendations: Left voice mail with ICM number and encouraged to call if experiencing any fluid symptoms.   Follow-up plan: ICM clinic phone appointment on 04/10/2022.   91 day device clinic remote transmission 04/03/2022.     EP/Cardiology Office Visits:    Next visit due 10/31/2022 with Dr Caryl Comes (no recall).   Copy of ICM check sent to Dr. Caryl Comes  3 month ICM trend: 03/07/2022.    12-14 Month ICM trend:     [name], RN 03/09/2022 12:09 PM

## 2022-03-13 ENCOUNTER — Other Ambulatory Visit: Payer: Self-pay | Admitting: Internal Medicine

## 2022-03-13 ENCOUNTER — Other Ambulatory Visit (HOSPITAL_COMMUNITY): Payer: Self-pay | Admitting: Internal Medicine

## 2022-03-13 NOTE — Telephone Encounter (Signed)
Received refill request for atorvastatin Pt had visit on 02/07/22 with another office to establish care  CMA placed call to patient-no answer, message left on recorder to confirm if she has transferred care. CMA will deny refill request at this time.Despina Hidden Cassady8/21/202311:36 AM

## 2022-03-14 ENCOUNTER — Other Ambulatory Visit: Payer: Self-pay | Admitting: Internal Medicine

## 2022-03-14 DIAGNOSIS — I5022 Chronic systolic (congestive) heart failure: Secondary | ICD-10-CM

## 2022-03-14 NOTE — Telephone Encounter (Signed)
Called pt to find out if she has transferred her care. No answer - left message to call the office.

## 2022-03-16 NOTE — Telephone Encounter (Signed)
Called pt again to see if she's a pt here at our office or has transferred her care - no answer;left message on self-identified vm to call our office.

## 2022-03-21 ENCOUNTER — Encounter: Payer: Self-pay | Admitting: *Deleted

## 2022-03-21 ENCOUNTER — Encounter: Payer: Medicare Other | Admitting: *Deleted

## 2022-03-21 VITALS — BP 140/86 | Wt 198.6 lb

## 2022-03-21 DIAGNOSIS — E119 Type 2 diabetes mellitus without complications: Secondary | ICD-10-CM | POA: Diagnosis not present

## 2022-03-21 DIAGNOSIS — E1140 Type 2 diabetes mellitus with diabetic neuropathy, unspecified: Secondary | ICD-10-CM

## 2022-03-21 NOTE — Patient Instructions (Signed)
Check blood sugars before breakfast every day and occasionally 2 hours after a meal  Begin walking  for    10-15  minutes   3  days a week and gradually increase to 30 minutes 5 x week  Eat 3 meals day,   1-2  snacks a day Space meals 4-6 hours apart Don't skip meals - eat 1 protein and 1 carbohydrate serving Avoid sugar sweetened drinks (tea, lemonade) unless treating a blood sugar  Carry fast acting glucose and a snack at all times Rotate injection sites

## 2022-03-21 NOTE — Progress Notes (Signed)
Diabetes Self-Management Education  Visit Type: Follow-up  Appt. Start Time: 1510 Appt. End Time: 1600  03/21/2022  [name], identified by name and date of birth, is a 56 y.o. female with a diagnosis of Diabetes: Type 2.   ASSESSMENT  Blood pressure (!) 140/86, weight 198 lb 9.6 oz (90.1 kg). Body mass index is 34.09 kg/m.   Diabetes Self-Management Education - 03/21/22 1633       Visit Information   Visit Type Follow-up      Initial Visit   Diabetes Type Type 2      Complications   How often do you check your blood sugar? 1-2 times/day    Fasting Blood glucose range (mg/dL) 70-129;>200;130-179    Number of hypoglycemic episodes per month 0    Number of hyperglycemic episodes ( >'200mg'$ /dL): Occasional    Can you tell when your blood sugar is high? Yes    What do you do if your blood sugar is high? Increases am insulin dose per MD - trying to get FBG below 130 mg/dL - Discussed drinking water and walking    Have you had a dilated eye exam in the past 12 months? Yes    Have you had a dental exam in the past 12 months? No    Are you checking your feet? Yes    How many days per week are you checking your feet? 7      Dietary Intake   Breakfast 2 meals and 1 snack/day    Beverage(s) water, occasional sweet tea and lemonade      Activity / Exercise   Activity / Exercise Type ADL's      Patient Education   Disease Pathophysiology Explored patient's options for treatment of their diabetes    Healthy Eating Role of diet in the treatment of diabetes and the relationship between the three main macronutrients and blood glucose level;Food label reading, portion sizes and measuring food.;Reviewed blood glucose goals for pre and post meals and how to evaluate the patients' food intake on their blood glucose level.;Meal timing in regards to the patients' current diabetes medication.;Information on hints to eating out and maintain blood glucose control.    Being Active Role of  exercise on diabetes management, blood pressure control and cardiac health.    Medications Taught/reviewed insulin/injectables, injection, site rotation, insulin/injectables storage and needle disposal.;Reviewed patients medication for diabetes, action, purpose, timing of dose and side effects.    Monitoring Purpose and frequency of SMBG.;Taught/discussed recording of test results and interpretation of SMBG.;Identified appropriate SMBG and/or A1C goals.    Acute complications Taught prevention, symptoms, and  treatment of hypoglycemia - the 15 rule.;Discussed and identified patients' prevention, symptoms, and treatment of hyperglycemia.    Chronic complications Relationship between chronic complications and blood glucose control    Diabetes Stress and Support Identified and addressed patients feelings and concerns about diabetes      Individualized Goals (developed by patient)   Nutrition Follow meal plan discussed;General guidelines for healthy choices and portions discussed    Physical Activity Exercise 3-5 times per week;30 minutes per day    Medications take my medication as prescribed    Monitoring  Test my blood glucose as discussed    Problem Solving Eating Pattern    Reducing Risk examine blood glucose patterns;treat hypoglycemia with 15 grams of carbs if blood glucose less than '70mg'$ /dL      Post-Education Assessment   Patient understands the diabetes disease and treatment process. Demonstrates understanding /  competency    Patient understands incorporating nutritional management into lifestyle. Comprehends key points    Patient undertands incorporating physical activity into lifestyle. Comprehends key points    Patient understands using medications safely. Demonstrates understanding / competency    Patient understands monitoring blood glucose, interpreting and using results Demonstrates understanding / competency    Patient understands prevention, detection, and treatment of acute  complications. Comprehends key points    Patient understands prevention, detection, and treatment of chronic complications. Demonstrates understanding / competency    Patient understands how to develop strategies to address psychosocial issues. Demonstrates understanding / competency    Patient understands how to develop strategies to promote health/change behavior. Demonstrates understanding / competency      Outcomes   Expected Outcomes Demonstrated interest in learning. Expect positive outcomes    Program Status Completed      Subsequent Visit   Since your last visit have you continued or begun to take your medications as prescribed? Yes    Since your last visit have you had your blood pressure checked? No    Since your last visit have you experienced any weight changes? Loss    Weight Loss (lbs) 0.6    Since your last visit, are you checking your blood glucose at least once a day? Yes         Individualized Plan for Diabetes Self-Management Training:   Learning Objective:  Patient will have a greater understanding of diabetes self-management. Patient education plan is to attend individual and/or group sessions per assessed needs and concerns.   Plan:   Patient Instructions  Check blood sugars before breakfast every day and occasionally 2 hours after a meal  Begin walking  for    10-15  minutes   3  days a week and gradually increase to 30 minutes 5 x week  Eat 3 meals day,   1-2  snacks a day Space meals 4-6 hours apart Don't skip meals - eat 1 protein and 1 carbohydrate serving Avoid sugar sweetened drinks (tea, lemonade) unless treating a blood sugar  Carry fast acting glucose and a snack at all times Rotate injection sites   Expected Outcomes:  Demonstrated interest in learning. Expect positive outcomes  Education material provided: Planning a Balanced Meal Quick and Balanced Meals Healthy Snack Choices (ADA) Carb-Mindful Smoothie Handout Symptoms, causes and  treatments of Hyperglycemia   If problems or questions, patient to contact team via:   Johny Drilling, RN, [place] [phone]  Future DSME appointment: PRN

## 2022-04-03 ENCOUNTER — Ambulatory Visit (INDEPENDENT_AMBULATORY_CARE_PROVIDER_SITE_OTHER): Payer: Medicare Other

## 2022-04-03 DIAGNOSIS — I5022 Chronic systolic (congestive) heart failure: Secondary | ICD-10-CM

## 2022-04-05 LAB — CUP PACEART REMOTE DEVICE CHECK
Battery Remaining Longevity: 25 mo
Battery Voltage: 2.93 V
Brady Statistic AP VP Percent: 0.35 %
Brady Statistic AP VS Percent: 0.01 %
Brady Statistic AS VP Percent: 99.61 %
Brady Statistic AS VS Percent: 0.03 %
Brady Statistic RA Percent Paced: 0.37 %
Brady Statistic RV Percent Paced: 99.93 %
Date Time Interrogation Session: 20230911033626
HighPow Impedance: 66 Ohm
Implantable Lead Implant Date: 20180524
Implantable Lead Implant Date: 20180524
Implantable Lead Implant Date: 20180524
Implantable Lead Location: 753858
Implantable Lead Location: 753859
Implantable Lead Location: 753860
Implantable Lead Model: 4398
Implantable Lead Model: 5076
Implantable Pulse Generator Implant Date: 20180524
Lead Channel Impedance Value: 178.5 Ohm
Lead Channel Impedance Value: 189.073
Lead Channel Impedance Value: 189.525
Lead Channel Impedance Value: 201.488
Lead Channel Impedance Value: 212.8 Ohm
Lead Channel Impedance Value: 323 Ohm
Lead Channel Impedance Value: 361 Ohm
Lead Channel Impedance Value: 399 Ohm
Lead Channel Impedance Value: 418 Ohm
Lead Channel Impedance Value: 418 Ohm
Lead Channel Impedance Value: 456 Ohm
Lead Channel Impedance Value: 475 Ohm
Lead Channel Impedance Value: 532 Ohm
Lead Channel Impedance Value: 627 Ohm
Lead Channel Impedance Value: 627 Ohm
Lead Channel Impedance Value: 665 Ohm
Lead Channel Impedance Value: 684 Ohm
Lead Channel Impedance Value: 722 Ohm
Lead Channel Pacing Threshold Amplitude: 0.5 V
Lead Channel Pacing Threshold Amplitude: 0.5 V
Lead Channel Pacing Threshold Amplitude: 0.625 V
Lead Channel Pacing Threshold Pulse Width: 0.4 ms
Lead Channel Pacing Threshold Pulse Width: 0.4 ms
Lead Channel Pacing Threshold Pulse Width: 0.4 ms
Lead Channel Sensing Intrinsic Amplitude: 2.5 mV
Lead Channel Sensing Intrinsic Amplitude: 2.5 mV
Lead Channel Sensing Intrinsic Amplitude: 29.625 mV
Lead Channel Sensing Intrinsic Amplitude: 29.625 mV
Lead Channel Setting Pacing Amplitude: 1 V
Lead Channel Setting Pacing Amplitude: 1.5 V
Lead Channel Setting Pacing Amplitude: 2.5 V
Lead Channel Setting Pacing Pulse Width: 0.4 ms
Lead Channel Setting Pacing Pulse Width: 0.4 ms
Lead Channel Setting Sensing Sensitivity: 0.3 mV

## 2022-04-10 ENCOUNTER — Ambulatory Visit (INDEPENDENT_AMBULATORY_CARE_PROVIDER_SITE_OTHER): Payer: Medicare Other

## 2022-04-10 DIAGNOSIS — I5022 Chronic systolic (congestive) heart failure: Secondary | ICD-10-CM

## 2022-04-10 DIAGNOSIS — Z95810 Presence of automatic (implantable) cardiac defibrillator: Secondary | ICD-10-CM

## 2022-04-11 NOTE — Progress Notes (Signed)
EPIC Encounter for ICM Monitoring  Patient Name: Susan Peck is a 56 y.o. female Date: 04/11/2022 Primary Care Physican: Romana Juniper, MD Primary Cardiologist: Caryl Comes (Bensimhon as needed) Electrophysiologist: Vergie Living Pacing: 99.9%  12/28/2021 Weight: 190 lbs     Spoke with patient and heart failure questions reviewed.  Pt has been having trouble with her teeth and had 2 pulled today.  She has been drinking more fluid lately which may contribute to decreased impedance.    Optivol Thoracic impedance suggesting possible fluid accumulation starting 9/15   Prescribed:  Furosemide 40 mg take 2 tablets (80 mg total) every morning and 1 tablet (40 mg total) every evening.  Pt self adjust Furosemide when needed.    Labs: 01/26/2021 Creatinine 0.59, Potassium 4.2, Sodium 141, GFR 106 A complete set of results can be found in Results Review.   Recommendations:  Recommendation to limit salt intake to 2000 mg daily and fluid intake to 64 oz daily.  Encouraged to call if experiencing any fluid symptoms.  She will self adjust Furosemide if she develops fluid symptoms.   Follow-up plan: ICM clinic phone appointment on 04/17/2022 to recheck fluid levels.   91 day device clinic remote transmission 07/03/2022.     EP/Cardiology Office Visits:    Next visit due 10/31/2022 with Dr Caryl Comes (no recall).   Copy of ICM check sent to Dr. Caryl Comes.  3 month ICM trend: 04/10/2022.    12-14 Month ICM trend:     [name], RN 04/11/2022 2:42 PM

## 2022-04-17 ENCOUNTER — Ambulatory Visit (INDEPENDENT_AMBULATORY_CARE_PROVIDER_SITE_OTHER): Payer: Medicare Other

## 2022-04-17 DIAGNOSIS — Z95810 Presence of automatic (implantable) cardiac defibrillator: Secondary | ICD-10-CM

## 2022-04-17 DIAGNOSIS — I5022 Chronic systolic (congestive) heart failure: Secondary | ICD-10-CM

## 2022-04-19 ENCOUNTER — Other Ambulatory Visit: Payer: Self-pay | Admitting: Internal Medicine

## 2022-04-20 ENCOUNTER — Ambulatory Visit
Admission: RE | Admit: 2022-04-20 | Discharge: 2022-04-20 | Disposition: A | Discharge status: Home / Self Care | Payer: Medicare Other | Source: Ambulatory Visit | Attending: Family Medicine | Admitting: Family Medicine

## 2022-04-20 ENCOUNTER — Telehealth: Payer: Self-pay

## 2022-04-20 DIAGNOSIS — Z1231 Encounter for screening mammogram for malignant neoplasm of breast: Secondary | ICD-10-CM | POA: Diagnosis present

## 2022-04-20 NOTE — Progress Notes (Signed)
EPIC Encounter for ICM Monitoring  Patient Name: Susan Peck is a 56 y.o. female Date: 04/20/2022 Primary Care Physican: Peggye Form, NP Primary Cardiologist: Caryl Comes (Bensimhon as needed) Electrophysiologist: Vergie Living Pacing: 99.9%  12/28/2021 Weight: 190 lbs     Attempted call to patient and unable to reach.  Left detailed message per DPR regarding transmission. Transmission reviewed.     Optivol Thoracic impedance suggesting fluid levels returned to normal.   Prescribed:  Furosemide 40 mg take 2 tablets (80 mg total) every morning and 1 tablet (40 mg total) every evening.  Pt self adjust Furosemide when needed.    Labs: 01/26/2021 Creatinine 0.59, Potassium 4.2, Sodium 141, GFR 106 A complete set of results can be found in Results Review.   Recommendations:  Left voice mail with ICM number and encouraged to call if experiencing any fluid symptoms.   Follow-up plan: ICM clinic phone appointment on 05/15/2022.   91 day device clinic remote transmission 07/03/2022.     EP/Cardiology Office Visits:    Next visit due 10/31/2022 with Dr Caryl Comes (no recall).   Copy of ICM check sent to Dr. Caryl Comes.  3 month ICM trend: 04/17/2022.    12-14 Month ICM trend:     [name], RN 04/20/2022 3:46 PM

## 2022-04-20 NOTE — Telephone Encounter (Signed)
Remote ICM transmission received.  Attempted call to patient regarding ICM remote transmission and left detailed message per DPR.  Advised to return call for any fluid symptoms or questions. Next ICM remote transmission scheduled 05/15/2022.    

## 2022-04-20 NOTE — Progress Notes (Signed)
Remote ICD transmission.   

## 2022-05-09 ENCOUNTER — Telehealth: Payer: Self-pay

## 2022-05-09 NOTE — Telephone Encounter (Signed)
Attempted return call as requested regarding medical clearance for upcoming procedure.  Left message explaining the office that is performing the procedure should send clearance form to her physicians to be cleared for surgery or procedures.  Advised if she needs more information to contact Dr Olin Pia office directly.

## 2022-05-15 ENCOUNTER — Ambulatory Visit (INDEPENDENT_AMBULATORY_CARE_PROVIDER_SITE_OTHER): Payer: Medicare Other

## 2022-05-15 DIAGNOSIS — Z95810 Presence of automatic (implantable) cardiac defibrillator: Secondary | ICD-10-CM | POA: Diagnosis not present

## 2022-05-15 DIAGNOSIS — I5022 Chronic systolic (congestive) heart failure: Secondary | ICD-10-CM | POA: Diagnosis not present

## 2022-05-15 NOTE — Progress Notes (Signed)
EPIC Encounter for ICM Monitoring  Patient Name: Susan Peck is a 56 y.o. female Date: 05/15/2022 Primary Care Physican: Peggye Form, NP Primary Cardiologist: Caryl Comes (Bensimhon as needed) Electrophysiologist: Vergie Living Pacing: 99.9%  12/28/2021 Weight: 190 lbs 05/15/2022 Weight: Not weighing at home.       Spoke with patient and heart failure questions reviewed.  Transmission results reviewed.  Pt reports hands and feet are swollen.        Optivol Thoracic impedance suggesting possible fluid accumulation starting 10/12.   Prescribed:  Furosemide 40 mg take 2 tablets (80 mg total) every morning and 1 tablet (40 mg total) every evening.  Pt self adjust Furosemide when needed.    Labs: 01/26/2022 Creatinine 0.6, BUN 17, Potassium 3.6, Sodium 141, GFR 125 Care Everywhere A complete set of results can be found in Results Review.   Recommendations:  Pt is taking extra Furosemide 2-3 days to help resolve fluid symptoms.   Follow-up plan: ICM clinic phone appointment on 05/22/2022 to recheck fluid levels.   91 day device clinic remote transmission 07/03/2022.     EP/Cardiology Office Visits:    Next visit due 10/31/2022 with Dr Caryl Comes (no recall).   Copy of ICM check sent to Dr. Caryl Comes.  3 month ICM trend: 05/15/2022.    12-14 Month ICM trend:     [name], RN 05/15/2022 7:01 AM

## 2022-05-22 ENCOUNTER — Ambulatory Visit (INDEPENDENT_AMBULATORY_CARE_PROVIDER_SITE_OTHER): Payer: Medicare Other

## 2022-05-22 DIAGNOSIS — I5022 Chronic systolic (congestive) heart failure: Secondary | ICD-10-CM

## 2022-05-22 DIAGNOSIS — Z95810 Presence of automatic (implantable) cardiac defibrillator: Secondary | ICD-10-CM

## 2022-05-22 NOTE — Progress Notes (Unsigned)
EPIC Encounter for ICM Monitoring  Patient Name: Susan Peck is a 56 y.o. female Date: 05/22/2022 Primary Care Physican: Peggye Form, NP Primary Cardiologist: Caryl Comes (Bensimhon as needed) Electrophysiologist: Vergie Living Pacing: >99%  12/28/2021 Weight: 190 lbs 05/15/2022 Weight: Not weighing at home.       Attempted call to patient and unable to reach.  Left detailed message per DPR regarding transmission. Transmission reviewed.       Optivol Thoracic impedance suggesting fluid levels returned to normal after taking extra Lasix.   Prescribed:  Furosemide 40 mg take 2 tablets (80 mg total) every morning and 1 tablet (40 mg total) every evening.  Pt self adjust Furosemide when needed.    Labs: 01/26/2022 Creatinine 0.6, BUN 17, Potassium 3.6, Sodium 141, GFR 125 Care Everywhere A complete set of results can be found in Results Review.   Recommendations:  Left voice mail with ICM number and encouraged to call if experiencing any fluid symptoms.   Follow-up plan: ICM clinic phone appointment on 06/26/2022.   91 day device clinic remote transmission 07/03/2022.     EP/Cardiology Office Visits:    Next visit due 10/31/2022 with Dr Caryl Comes (no recall).   Copy of ICM check sent to Dr. Caryl Comes.   3 month ICM trend: 05/22/2022.    12-14 Month ICM trend:     [name], RN 05/22/2022 9:47 AM

## 2022-05-23 ENCOUNTER — Telehealth: Payer: Self-pay

## 2022-05-23 NOTE — Telephone Encounter (Signed)
Remote ICM transmission received.  Attempted call to patient regarding ICM remote transmission and left detailed message per DPR.  Advised to return call for any fluid symptoms or questions. Next ICM remote transmission scheduled 06/26/2022.

## 2022-05-24 NOTE — Progress Notes (Signed)
Spoke with patient and heart failure questions reviewed.  Transmission results reviewed.  Pt asymptomatic for fluid accumulation.  Reports feeling well at this time and voices no complaints.   No changes and encouraged to call if experiencing any fluid symptoms.  Next ICM Remote Transmission 12/4.

## 2022-06-26 ENCOUNTER — Ambulatory Visit (INDEPENDENT_AMBULATORY_CARE_PROVIDER_SITE_OTHER): Payer: Medicare Other

## 2022-06-26 DIAGNOSIS — I5022 Chronic systolic (congestive) heart failure: Secondary | ICD-10-CM

## 2022-06-26 DIAGNOSIS — Z95810 Presence of automatic (implantable) cardiac defibrillator: Secondary | ICD-10-CM

## 2022-06-30 ENCOUNTER — Telehealth: Payer: Self-pay

## 2022-06-30 NOTE — Progress Notes (Signed)
EPIC Encounter for ICM Monitoring  Patient Name: Susan Peck is a 56 y.o. female Date: 06/30/2022 Primary Care Physican: Peggye Form, NP Primary Cardiologist: Caryl Comes (Bensimhon as needed) Electrophysiologist: Vergie Living Pacing: 99.9%  12/28/2021 Weight: 190 lbs 05/15/2022 Weight: Not weighing at home.       Attempted call to patient and unable to reach.  Left detailed message per DPR regarding transmission. Transmission reviewed.       Optivol Thoracic impedance suggesting intermittent days with possible fluid accumulation within last month.   Prescribed:  Furosemide 40 mg take 2 tablets (80 mg total) every morning and 1 tablet (40 mg total) every evening.  Pt self adjust Furosemide when needed.    Labs: 01/26/2022 Creatinine 0.6, BUN 17, Potassium 3.6, Sodium 141, GFR 125 Care Everywhere A complete set of results can be found in Results Review.   Recommendations:  Left voice mail with ICM number and encouraged to call if experiencing any fluid symptoms.   Follow-up plan: ICM clinic phone appointment on 08/07/2022.   91 day device clinic remote transmission 10/02/2022.     EP/Cardiology Office Visits:    Next visit due 10/31/2022 with Dr Caryl Comes (no recall).   Copy of ICM check sent to Dr. Caryl Comes.   3 month ICM trend: 06/26/2022.    12-14 Month ICM trend:     [name], RN 06/30/2022 2:23 PM

## 2022-06-30 NOTE — Telephone Encounter (Signed)
Remote ICM transmission received.  Attempted call to patient regarding ICM remote transmission and left detailed message per DPR.  Advised to return call for any fluid symptoms or questions. Next ICM remote transmission scheduled 08/07/2022.

## 2022-07-03 ENCOUNTER — Ambulatory Visit (INDEPENDENT_AMBULATORY_CARE_PROVIDER_SITE_OTHER): Payer: Medicare Other

## 2022-07-03 DIAGNOSIS — I5022 Chronic systolic (congestive) heart failure: Secondary | ICD-10-CM

## 2022-07-03 DIAGNOSIS — I428 Other cardiomyopathies: Secondary | ICD-10-CM

## 2022-07-04 LAB — CUP PACEART REMOTE DEVICE CHECK
Battery Remaining Longevity: 22 mo
Battery Voltage: 2.92 V
Brady Statistic AP VP Percent: 0.06 %
Brady Statistic AP VS Percent: 0.01 %
Brady Statistic AS VP Percent: 99.89 %
Brady Statistic AS VS Percent: 0.05 %
Brady Statistic RA Percent Paced: 0.07 %
Brady Statistic RV Percent Paced: 99.91 %
Date Time Interrogation Session: 20231211033326
HighPow Impedance: 68 Ohm
Implantable Lead Connection Status: 753985
Implantable Lead Connection Status: 753985
Implantable Lead Connection Status: 753985
Implantable Lead Implant Date: 20180524
Implantable Lead Implant Date: 20180524
Implantable Lead Implant Date: 20180524
Implantable Lead Location: 753858
Implantable Lead Location: 753859
Implantable Lead Location: 753860
Implantable Lead Model: 4398
Implantable Lead Model: 5076
Implantable Pulse Generator Implant Date: 20180524
Lead Channel Impedance Value: 170.472
Lead Channel Impedance Value: 180.5 Ohm
Lead Channel Impedance Value: 182.205
Lead Channel Impedance Value: 193.707
Lead Channel Impedance Value: 193.707
Lead Channel Impedance Value: 323 Ohm
Lead Channel Impedance Value: 361 Ohm
Lead Channel Impedance Value: 361 Ohm
Lead Channel Impedance Value: 399 Ohm
Lead Channel Impedance Value: 418 Ohm
Lead Channel Impedance Value: 418 Ohm
Lead Channel Impedance Value: 456 Ohm
Lead Channel Impedance Value: 513 Ohm
Lead Channel Impedance Value: 608 Ohm
Lead Channel Impedance Value: 608 Ohm
Lead Channel Impedance Value: 627 Ohm
Lead Channel Impedance Value: 665 Ohm
Lead Channel Impedance Value: 665 Ohm
Lead Channel Pacing Threshold Amplitude: 0.5 V
Lead Channel Pacing Threshold Amplitude: 0.5 V
Lead Channel Pacing Threshold Amplitude: 0.625 V
Lead Channel Pacing Threshold Pulse Width: 0.4 ms
Lead Channel Pacing Threshold Pulse Width: 0.4 ms
Lead Channel Pacing Threshold Pulse Width: 0.4 ms
Lead Channel Sensing Intrinsic Amplitude: 21.625 mV
Lead Channel Sensing Intrinsic Amplitude: 21.625 mV
Lead Channel Sensing Intrinsic Amplitude: 3.375 mV
Lead Channel Sensing Intrinsic Amplitude: 3.375 mV
Lead Channel Setting Pacing Amplitude: 1 V
Lead Channel Setting Pacing Amplitude: 1.5 V
Lead Channel Setting Pacing Amplitude: 2.5 V
Lead Channel Setting Pacing Pulse Width: 0.4 ms
Lead Channel Setting Pacing Pulse Width: 0.4 ms
Lead Channel Setting Sensing Sensitivity: 0.3 mV
Zone Setting Status: 755011
Zone Setting Status: 755011

## 2022-08-07 ENCOUNTER — Ambulatory Visit (INDEPENDENT_AMBULATORY_CARE_PROVIDER_SITE_OTHER): Payer: 59

## 2022-08-07 DIAGNOSIS — I5022 Chronic systolic (congestive) heart failure: Secondary | ICD-10-CM

## 2022-08-07 DIAGNOSIS — Z95810 Presence of automatic (implantable) cardiac defibrillator: Secondary | ICD-10-CM

## 2022-08-10 ENCOUNTER — Telehealth: Payer: Self-pay

## 2022-08-10 NOTE — Progress Notes (Signed)
Remote ICD transmission.   

## 2022-08-10 NOTE — Progress Notes (Signed)
EPIC Encounter for ICM Monitoring  Patient Name: Susan Peck is a 57 y.o. female Date: 08/10/2022 Primary Care Physican: Peggye Form, NP Primary Cardiologist: Caryl Comes ([facility] as needed) Electrophysiologist: Vergie Living Pacing: 99.9%  12/28/2021 Weight: 190 lbs 05/15/2022 Weight: Not weighing at home.       Attempted call to patient and unable to reach.  Left detailed message per DPR regarding transmission. Transmission reviewed.  Thyroid surgery scheduled 1/22.   Optivol Thoracic impedance suggesting intermittent days with possible fluid accumulation within last month.   Prescribed:  Furosemide 40 mg take 2 tablets (80 mg total) every morning and 1 tablet (40 mg total) every evening.  Pt self adjust Furosemide when needed.    Labs: 01/26/2022 Creatinine 0.6, BUN 17, Potassium 3.6, Sodium 141, GFR 125 Care Everywhere A complete set of results can be found in Results Review.   Recommendations:  Left voice mail with ICM number and encouraged to call if experiencing any fluid symptoms.   Follow-up plan: ICM clinic phone appointment on 09/12/2022.   91 day device clinic remote transmission 10/02/2022.     EP/Cardiology Office Visits:    Next visit due 10/31/2022 with Dr Caryl Comes (no recall).   Copy of ICM check sent to Dr. Caryl Comes.   3 month ICM trend: 08/07/2022.    12-14 Month ICM trend:     [name], RN 08/10/2022 9:36 AM

## 2022-08-10 NOTE — Telephone Encounter (Signed)
Remote ICM transmission received.  Attempted call to patient regarding ICM remote transmission and left detailed message per DPR.  Advised to return call for any fluid symptoms or questions. Next ICM remote transmission scheduled 09/12/2022.

## 2022-08-14 ENCOUNTER — Other Ambulatory Visit (HOSPITAL_COMMUNITY): Payer: Self-pay | Admitting: Internal Medicine

## 2022-08-14 ENCOUNTER — Other Ambulatory Visit: Payer: Self-pay | Admitting: Internal Medicine

## 2022-08-14 DIAGNOSIS — I5022 Chronic systolic (congestive) heart failure: Secondary | ICD-10-CM

## 2022-08-14 DIAGNOSIS — S81801A Unspecified open wound, right lower leg, initial encounter: Secondary | ICD-10-CM

## 2022-08-15 ENCOUNTER — Other Ambulatory Visit (HOSPITAL_COMMUNITY): Payer: Self-pay

## 2022-08-15 ENCOUNTER — Telehealth: Payer: Self-pay | Admitting: Internal Medicine

## 2022-08-15 DIAGNOSIS — I5022 Chronic systolic (congestive) heart failure: Secondary | ICD-10-CM

## 2022-08-15 MED ORDER — ENTRESTO 97-103 MG PO TABS: 1.0000 | ORAL_TABLET | Freq: Two times a day (BID) | ORAL | 11 refills | Status: DC

## 2022-08-15 NOTE — Telephone Encounter (Signed)
Pt called in stating she had graduated from CHF clinic and needs to f/u with gen card, if this is so can we have a referral placed, please. Thank you

## 2022-08-30 ENCOUNTER — Telehealth: Payer: Self-pay | Admitting: Internal Medicine

## 2022-08-30 NOTE — Telephone Encounter (Signed)
Pt is having surgery in 2 days and states that surgeon's office wanted her to reach out to see if she needed to hold ASA for her TOTAL THYROID LOBECTOMY, UNILATERAL; WITH OR WITHOUT ISTHMUSECTOMY scheduled for 2 days from now, 2/9. Pt advised that office protocol requires surgeon's office to send [place] a request on letterhead asking for clearance.  Informed that if they require this, then her surgery will need to be rescheduled as we would not be able to get her into our office by tomorrow and give clearance. Pt hoping this will not be the case as she has already had to r/s this. She appreciates my speaking with her.

## 2022-08-30 NOTE — Telephone Encounter (Signed)
Patient is following up, again requesting a call back regarding holding Aspirin.

## 2022-08-30 NOTE — Telephone Encounter (Signed)
Calling to speak to the nurse to see if its okay to take a Asprin on her surgery date. Please advise

## 2022-09-07 ENCOUNTER — Encounter: Payer: Self-pay | Admitting: Cardiovascular Disease

## 2022-09-07 ENCOUNTER — Ambulatory Visit: Payer: 59 | Attending: Cardiovascular Disease | Admitting: Cardiovascular Disease

## 2022-09-07 VITALS — BP 142/100 | HR 76 | Ht 64.0 in | Wt 200.0 lb

## 2022-09-07 DIAGNOSIS — I5022 Chronic systolic (congestive) heart failure: Secondary | ICD-10-CM | POA: Diagnosis not present

## 2022-09-07 DIAGNOSIS — I428 Other cardiomyopathies: Secondary | ICD-10-CM

## 2022-09-07 DIAGNOSIS — I10 Essential (primary) hypertension: Secondary | ICD-10-CM

## 2022-09-07 NOTE — Patient Instructions (Signed)
Medication Instructions:  No changes *If you need a refill on your cardiac medications before your next appointment, please call your pharmacy*   Lab Work: none If you have labs (blood work) drawn today and your tests are completely normal, you will receive your results only by: MyChart Message (if you have MyChart) OR A paper copy in the mail If you have any lab test that is abnormal or we need to change your treatment, we will call you to review the results.   Testing/Procedures: none   Follow-Up: At [facility], you and your health needs are our priority.  As part of our continuing mission to provide you with exceptional heart care, we have created designated Provider Care Teams.  These Care Teams include your primary Cardiologist (physician) and Advanced Practice Providers (APPs -  Physician Assistants and Nurse Practitioners) who all work together to provide you with the care you need, when you need it.   Your next appointment:   12 month(s)  Provider:   Christopher McAlhany, MD      

## 2022-09-07 NOTE — Progress Notes (Signed)
Chief Complaint  Patient presents with   Follow-up    NICM   History of Present Illness: 57 yo female with history of DM, anxiety, depression, GERD, NICM with ICD in place, HTN, sleep apnea here today for follow up. She has been followed in the Advanced Heart Failure clinic and in the EP clinic (Dr. Caryl Comes). She has graduated from the [facility]. She has a non-ischemic cardiomyopathy dating back to 2012. BIV ICD implanted in 2018. Last echo in June 2021 with LVEF=55-60%. No valve disease.  Cardiac cath in 2017 with no evidence of CAD.   She is here today for follow up. The patient denies any chest pain, dyspnea, palpitations, lower extremity edema, orthopnea, PND, dizziness, near syncope or syncope. She has thyroid surgery tomorrow. She has been anxious and her BP has been up slightly over the past few weeks.   Primary Care Physician: Peggye Form, NP   Past Medical History:  Diagnosis Date   AICD (automatic cardioverter/defibrillator) present 12/13/2016   Anxiety    no meds   Asthma    CHF (congestive heart failure) ([place])    Depression    no meds   Diabetes mellitus    recent dx 11/17/11 - started med 11/18/11 type 2   GERD (gastroesophageal reflux disease)    Goiter 07/27/2011   non-neoplastic goiter - fine needle aspiration - benign sees dr Dwyane Dee for   Heart murmur    dx 2 yrs ago per pt   Herpes genitalis in women    Hypertension    IBS (irritable bowel syndrome)    tx with diet per pt   Low back pain    history   Obesity    Seasonal allergies    Shortness of breath    occasional - exercise induced   Sleep apnea    cpap broken   Systolic heart failure    May 2011 EF 35-40%, 04/03/11 EF 25-30%, 06/27/11 EF 30-35%    Past Surgical History:  Procedure Laterality Date   ABDOMINAL HYSTERECTOMY     BIV ICD INSERTION CRT-D N/A 12/13/2016   Procedure: BiV ICD Insertion CRT-D;  Surgeon: Deboraha Sprang, MD;  Location: [facility];  Service:  Cardiovascular;  Laterality: N/A;   cardiac catherization  2004   [place], [place] - Dr Lynnell Jude   CARDIAC CATHETERIZATION     CARDIAC CATHETERIZATION N/A 09/15/2015   Procedure: Right/Left Heart Cath and Coronary Angiography;  Surgeon: Jolaine Artist, MD;  Location: [facility];  Service: Cardiovascular;  Laterality: N/A;   COLONOSCOPY WITH PROPOFOL N/A 03/26/2015   Procedure: COLONOSCOPY WITH PROPOFOL;  Surgeon: Carol Ada, MD;  Location: [facility];  Service: Endoscopy;  Laterality: N/A;   colonscopy     CYSTOSCOPY  11/23/2011   Procedure: CYSTOSCOPY;  Surgeon: Jolayne Haines, MD;  Location: [facility];  Service: Gynecology;  Laterality: N/A;   DIAGNOSTIC LAPAROSCOPY     of pelvis   DILATION AND CURETTAGE OF UTERUS  12/2006,  10/2005   hysteroscopy surgery x 2   ESOPHAGOGASTRODUODENOSCOPY (EGD) WITH PROPOFOL N/A 11/12/2020   Procedure: ESOPHAGOGASTRODUODENOSCOPY (EGD) WITH PROPOFOL;  Surgeon: Carol Ada, MD;  Location: [facility];  Service: Endoscopy;  Laterality: N/A;   INSERTION OF ICD  12/13/2016   BIV   LAPAROSCOPIC ASSISTED VAGINAL HYSTERECTOMY  11/23/2011   Procedure: LAPAROSCOPIC ASSISTED VAGINAL HYSTERECTOMY;  Surgeon: Jolayne Haines, MD;  Location: [facility];  Service: Gynecology;  Laterality: N/A;  NOVASURE ABLATION     10/2005   SALPINGOOPHORECTOMY  11/23/2011   Procedure: SALPINGO OOPHERECTOMY;  Surgeon: Jolayne Haines, MD;  Location: [facility];  Service: Gynecology;  Laterality: Bilateral;   SAVORY DILATION N/A 11/12/2020   Procedure: SAVORY DILATION;  Surgeon: Carol Ada, MD;  Location: [facility];  Service: Endoscopy;  Laterality: N/A;   SVD     x 3   TOOTH EXTRACTION N/A 04/05/2017   Procedure: DENTAL EXTRACTIONS OF TEETH FIVE, SEVEN, SIXTEEN, SEVENTEEN;  Surgeon: Diona Browner, DDS;  Location: [facility];  Service: Oral Surgery;  Laterality: N/A;   TUBAL LIGATION     WISDOM TOOTH EXTRACTION      Current Outpatient Medications  Medication Sig Dispense Refill   aspirin  81 MG chewable tablet Chew 81 mg by mouth daily.      aspirin-sod bicarb-citric acid (ALKA-SELTZER) 325 MG TBEF tablet Take 650 mg by mouth every 6 (six) hours as needed (indigestion).     atorvastatin (LIPITOR) 40 MG tablet TAKE 1 TABLET BY MOUTH EVERY DAY 90 tablet 3   BD PEN NEEDLE NANO 2ND GEN 32G X 4 MM MISC USE TO INJECT XULTOPHY DAILY 100 each 5   carvedilol (COREG) 25 MG tablet TAKE 1 TABLET (25 MG TOTAL) BY MOUTH 2 (TWO) TIMES DAILY WITH A MEAL. 180 tablet 3   eplerenone (INSPRA) 25 MG tablet Take 25 mg by mouth daily.     fluticasone (FLONASE) 50 MCG/ACT nasal spray PLACE 1 SPRAY INTO BOTH NOSTRILS 2 (TWO) TIMES DAILY 48 mL 1   furosemide (LASIX) 40 MG tablet TAKE 2 TABS EVERY MORNING AND 1 TAB EVERY EVENING. 270 tablet 0   glucose blood (ONETOUCH VERIO) test strip Insulin dependent. Check blood sugar up to 3 times daily. diag code E11.65 100 each 12   Insulin Degludec-Liraglutide (XULTOPHY) 100-3.6 UNIT-MG/ML SOPN Inject 20-40 Units into the skin daily.     Lancet Devices (ONETOUCH DELICA PLUS LANCING) MISC USE AS DIRECTED TO CHECK BLOOD SUGAR 1 each 2   LINZESS 72 MCG capsule Take 72 mcg by mouth daily.     mupirocin cream (BACTROBAN) 2 % APPLY TO AFFECTED AREA TWICE A DAY 15 g 1   ONETOUCH DELICA LANCETS FINE MISC Insulin dependent. Check blood sugar up to 3 times daily. diag code E11.65 100 each 12   PATADAY 0.2 % SOLN Place 1 drop into both eyes daily. 2.5 mL 4   sacubitril-valsartan (ENTRESTO) 97-103 MG Take 1 tablet by mouth 2 (two) times daily. 60 tablet 11   VYZULTA 0.024 % SOLN Place 1 drop into both eyes at bedtime. 5 mL 1   No current facility-administered medications for this visit.    Allergies  Allergen Reactions   Shellfish-Derived Products Anaphylaxis    Social History   Socioeconomic History   Marital status: Divorced    Spouse name: Not on file   Number of children: Y   Years of education: Not on file   Highest education level: Not on file  Occupational  History   Occupation: Surveyor, quantity: [employer]    Comment: and Systems analyst  Tobacco Use   Smoking status: Never   Smokeless tobacco: Never  Vaping Use   Vaping Use: Never used  Substance and Sexual Activity   Alcohol use: Yes    Comment: Rare   Drug use: No   Sexual activity: Yes    Birth control/protection: Surgical    Comment: , intercourse age 81, more than 25  sexual partners  Other Topics Concern   Not on file  Social History Narrative   She lives with her husband and son.  She is works for [employer].   Social Determinants of Health   Financial Resource Strain: Not on file  Food Insecurity: Not on file  Transportation Needs: Not on file  Physical Activity: Not on file  Stress: Not on file  Social Connections: Not on file  Intimate Partner Violence: Not on file    Family History  Problem Relation Age of Onset   Heart disease Maternal Aunt    Breast cancer Mother    Thyroid disease Mother    Hypertension Maternal Grandmother    Diabetes Maternal Grandmother    Breast cancer Maternal Aunt    Heart disease Maternal Aunt    Diabetes Maternal Grandfather    Diabetes Paternal Grandmother    Diabetes Paternal Grandfather    Hypertension Paternal Grandfather     Review of Systems:  As stated in the HPI and otherwise negative.   BP (!) 142/100   Pulse 76   Ht 5' 4"$  (1.626 m)   Wt 90.7 kg   SpO2 98%   BMI 34.33 kg/m   Physical Examination: General: Well developed, well nourished, NAD  HEENT: OP clear, mucus membranes moist  SKIN: warm, dry. No rashes. Neuro: No focal deficits  Musculoskeletal: Muscle strength 5/5 all ext  Psychiatric: Mood and affect normal  Neck: No JVD, no carotid bruits, no thyromegaly, no lymphadenopathy.  Lungs:Clear bilaterally, no wheezes, rhonci, crackles Cardiovascular: Regular rate and rhythm. No murmurs, gallops or rubs. Abdomen:Soft. Bowel sounds present. Non-tender.  Extremities: No lower extremity edema. Pulses  are 2 + in the bilateral DP/PT.  EKG:  EKG is ordered today. The ekg ordered today demonstrates Ventricular pacing  Recent Labs: No results found for requested labs within last 365 days.   Lipid Panel    Component Value Date/Time   CHOL 206 (H) 04/15/2019 1545   TRIG 107 04/15/2019 1545   HDL 39 (L) 04/15/2019 1545   CHOLHDL 5.3 (H) 04/15/2019 1545   CHOLHDL 5 11/09/2015 1147   VLDL 37.8 11/09/2015 1147   LDLCALC 148 (H) 04/15/2019 1545     Wt Readings from Last 3 Encounters:  09/07/22 90.7 kg  03/21/22 90.1 kg  02/23/22 90.3 kg    Assessment and Plan:   1. Non-ischemic cardiomyopathy/Chronic systolic CHF: LVEF normal by echo in 2021. Continue Coreg, Eplerenone and Entresto. Volume status is ok. Weight is stable. Continue Lasix 80 mg am and 40 mg PM. BIV ICD in place and followed by Dr. Caryl Comes.   2. HTN: BP is controlled at home but up over the past few days has been up as she is anxious about her surgery tomorrow.   Labs/ tests ordered today include:  No orders of the defined types were placed in this encounter.  Disposition:   F/U with me in one year   Signed, Lauree Chandler, MD, [facility] 09/07/2022 10:32 AM    [facility] [address], [address], [place]  [address] Phone: [phone]; Fax: [phone]

## 2022-09-12 ENCOUNTER — Ambulatory Visit: Payer: 59

## 2022-09-12 DIAGNOSIS — Z95810 Presence of automatic (implantable) cardiac defibrillator: Secondary | ICD-10-CM

## 2022-09-12 DIAGNOSIS — I5022 Chronic systolic (congestive) heart failure: Secondary | ICD-10-CM | POA: Diagnosis not present

## 2022-09-12 NOTE — Addendum Note (Signed)
Addended by: Nickolas Madrid on: 09/12/2022 08:18 AM   Modules accepted: Orders

## 2022-09-15 ENCOUNTER — Telehealth: Payer: Self-pay

## 2022-09-15 NOTE — Progress Notes (Signed)
EPIC Encounter for ICM Monitoring  Patient Name: Susan Peck is a 57 y.o. female Date: 09/15/2022 Primary Care Physican: Peggye Form, NP Primary Cardiologist: Caryl Comes ([place] as needed) Electrophysiologist: Vergie Living Pacing: 99.9%  09/07/2022 Office Weight: 200 lbs       Attempted call to patient and unable to reach.  Left detailed message per DPR regarding transmission. Transmission reviewed.  Thyroid surgery scheduled 1/22.   Optivol Thoracic impedance suggesting possible fluid accumulation starting 2/15 and returning close to baseline 2/20.   Prescribed:  Furosemide 40 mg take 2 tablets (80 mg total) every morning and 1 tablet (40 mg total) every evening.  Pt self adjust Furosemide when needed.    Labs: 01/26/2022 Creatinine 0.6, BUN 17, Potassium 3.6, Sodium 141, GFR 125 Care Everywhere A complete set of results can be found in Results Review.   Recommendations:  Left voice mail with ICM number and encouraged to call if experiencing any fluid symptoms.   Follow-up plan: ICM clinic phone appointment on 10/16/2022.   91 day device clinic remote transmission 10/02/2022.     EP/Cardiology Office Visits:    Next visit due 10/31/2022 with Dr Caryl Comes (no recall).   Copy of ICM check sent to Dr. Caryl Comes.   3 month ICM trend: 09/12/2022.    12-14 Month ICM trend:     [name], RN 09/15/2022 2:34 PM

## 2022-09-15 NOTE — Telephone Encounter (Signed)
Remote ICM transmission received.  Attempted call to patient regarding ICM remote transmission and left detailed message per DPR.  Advised to return call for any fluid symptoms or questions. Next ICM remote transmission scheduled 10/16/2022.

## 2022-10-02 ENCOUNTER — Ambulatory Visit (INDEPENDENT_AMBULATORY_CARE_PROVIDER_SITE_OTHER): Payer: 59

## 2022-10-02 DIAGNOSIS — I5022 Chronic systolic (congestive) heart failure: Secondary | ICD-10-CM

## 2022-10-02 DIAGNOSIS — I428 Other cardiomyopathies: Secondary | ICD-10-CM

## 2022-10-04 ENCOUNTER — Other Ambulatory Visit: Payer: Self-pay | Admitting: Student

## 2022-10-04 DIAGNOSIS — E1165 Type 2 diabetes mellitus with hyperglycemia: Secondary | ICD-10-CM

## 2022-10-04 LAB — CUP PACEART REMOTE DEVICE CHECK
Battery Remaining Longevity: 21 mo
Battery Voltage: 2.91 V
Brady Statistic AP VP Percent: 0.11 %
Brady Statistic AP VS Percent: 0.01 %
Brady Statistic AS VP Percent: 99.84 %
Brady Statistic AS VS Percent: 0.04 %
Brady Statistic RA Percent Paced: 0.12 %
Brady Statistic RV Percent Paced: 99.92 %
Date Time Interrogation Session: 20240311163628
HighPow Impedance: 67 Ohm
Implantable Lead Connection Status: 753985
Implantable Lead Connection Status: 753985
Implantable Lead Connection Status: 753985
Implantable Lead Implant Date: 20180524
Implantable Lead Implant Date: 20180524
Implantable Lead Implant Date: 20180524
Implantable Lead Location: 753858
Implantable Lead Location: 753859
Implantable Lead Location: 753860
Implantable Lead Model: 4398
Implantable Lead Model: 5076
Implantable Pulse Generator Implant Date: 20180524
Lead Channel Impedance Value: 170.472
Lead Channel Impedance Value: 182.205
Lead Channel Impedance Value: 189.525
Lead Channel Impedance Value: 193.707
Lead Channel Impedance Value: 204.14 Ohm
Lead Channel Impedance Value: 323 Ohm
Lead Channel Impedance Value: 361 Ohm
Lead Channel Impedance Value: 399 Ohm
Lead Channel Impedance Value: 418 Ohm
Lead Channel Impedance Value: 456 Ohm
Lead Channel Impedance Value: 456 Ohm
Lead Channel Impedance Value: 456 Ohm
Lead Channel Impedance Value: 532 Ohm
Lead Channel Impedance Value: 608 Ohm
Lead Channel Impedance Value: 665 Ohm
Lead Channel Impedance Value: 665 Ohm
Lead Channel Impedance Value: 684 Ohm
Lead Channel Impedance Value: 722 Ohm
Lead Channel Pacing Threshold Amplitude: 0.375 V
Lead Channel Pacing Threshold Amplitude: 0.5 V
Lead Channel Pacing Threshold Amplitude: 0.625 V
Lead Channel Pacing Threshold Pulse Width: 0.4 ms
Lead Channel Pacing Threshold Pulse Width: 0.4 ms
Lead Channel Pacing Threshold Pulse Width: 0.4 ms
Lead Channel Sensing Intrinsic Amplitude: 2.125 mV
Lead Channel Sensing Intrinsic Amplitude: 2.125 mV
Lead Channel Sensing Intrinsic Amplitude: 21.75 mV
Lead Channel Sensing Intrinsic Amplitude: 21.75 mV
Lead Channel Setting Pacing Amplitude: 1 V
Lead Channel Setting Pacing Amplitude: 1.5 V
Lead Channel Setting Pacing Amplitude: 2.5 V
Lead Channel Setting Pacing Pulse Width: 0.4 ms
Lead Channel Setting Pacing Pulse Width: 0.4 ms
Lead Channel Setting Sensing Sensitivity: 0.3 mV
Zone Setting Status: 755011
Zone Setting Status: 755011

## 2022-10-16 ENCOUNTER — Ambulatory Visit: Payer: 59 | Attending: Internal Medicine

## 2022-10-16 DIAGNOSIS — I5022 Chronic systolic (congestive) heart failure: Secondary | ICD-10-CM | POA: Diagnosis not present

## 2022-10-16 DIAGNOSIS — Z95810 Presence of automatic (implantable) cardiac defibrillator: Secondary | ICD-10-CM

## 2022-10-20 NOTE — Progress Notes (Signed)
EPIC Encounter for ICM Monitoring  Patient Name: Susan Peck is a 57 y.o. female Date: 10/20/2022 Primary Care Physican: Peggye Form, NP Primary Cardiologist: Caryl Comes ([place] as needed) Electrophysiologist: Vergie Living Pacing: 99.9%  09/07/2022 Office Weight: 200 lbs   10/20/2022 Weight: 200 lbs     Spoke with patient and heart failure questions reviewed.  Transmission results reviewed.  Pt asymptomatic for fluid accumulation. She has some depression over the last couple of weeks and planning on reaching out to PCP for assistance. She is still grieving over the loss of her father.     Optivol Thoracic impedance suggesting intermittent days with possible fluid accumulation within the last month.   Prescribed:  Furosemide 40 mg take 2 tablets (80 mg total) every morning and 1 tablet (40 mg total) every evening.  Pt self adjust Furosemide when needed.    Labs: 01/26/2022 Creatinine 0.6, BUN 17, Potassium 3.6, Sodium 141, GFR 125 Care Everywhere A complete set of results can be found in Results Review.   Recommendations: No changes and encouraged to call if experiencing any fluid symptoms.   Follow-up plan: ICM clinic phone appointment on 11/20/2022.   91 day device clinic remote transmission 01/01/2023.     EP/Cardiology Office Visits:    Next visit due 10/31/2022 with Dr Caryl Comes (no recall).   Copy of ICM check sent to Dr. Caryl Comes.   3 month ICM trend: 10/16/2022.    12-14 Month ICM trend:     [name], RN 10/20/2022 1:01 PM

## 2022-11-10 ENCOUNTER — Other Ambulatory Visit: Payer: Self-pay | Admitting: *Deleted

## 2022-11-10 MED ORDER — CARVEDILOL 25 MG PO TABS: 25.0000 mg | ORAL_TABLET | Freq: Two times a day (BID) | ORAL | 3 refills | Status: DC

## 2022-11-13 ENCOUNTER — Other Ambulatory Visit: Payer: Self-pay | Admitting: Internal Medicine

## 2022-11-13 ENCOUNTER — Other Ambulatory Visit: Payer: Self-pay

## 2022-11-13 MED ORDER — ENTRESTO 97-103 MG PO TABS: 1.0000 | ORAL_TABLET | Freq: Two times a day (BID) | ORAL | 2 refills | Status: DC

## 2022-11-13 NOTE — Progress Notes (Signed)
Remote ICD transmission.   

## 2022-11-15 ENCOUNTER — Telehealth: Payer: Self-pay | Admitting: Cardiovascular Disease

## 2022-11-15 NOTE — Telephone Encounter (Signed)
Pt states she has to have some paperwork filled out for her taxes and would like to speak to an RN about it. Please advise.

## 2022-11-15 NOTE — Telephone Encounter (Signed)
Pt stated she is currently enrolled in a program in [place] that will assist paying property taxes. Patient stated [facility] advised the documents have to be completed by MD not APP. Pt stated she is willing to drop off the documents to the office, they need to be completed by the end of May. Will forward to MD and nurse.

## 2022-11-20 ENCOUNTER — Ambulatory Visit: Payer: 59 | Attending: Internal Medicine

## 2022-11-20 DIAGNOSIS — I5022 Chronic systolic (congestive) heart failure: Secondary | ICD-10-CM

## 2022-11-20 DIAGNOSIS — Z95810 Presence of automatic (implantable) cardiac defibrillator: Secondary | ICD-10-CM | POA: Diagnosis not present

## 2022-11-21 ENCOUNTER — Encounter: Payer: Self-pay | Admitting: Cardiovascular Disease

## 2022-11-21 NOTE — Telephone Encounter (Signed)
Good afternoon I am calling in regards of a call that I made on last week I need some paperwork signed by the doctor and no one else can do it but him I left a message and I spoke with several people in the office pertaining to what I needed signed no one has gotten back with me I need this done before the end of May and I am trying hard to get in contact with someone so that I can get this paperwork done so please return my call at your very earliest convenience thank you _______________________________________________________    I called the patient.  She is requesting her [facility] disability statement form be signed so that she has a defibrillator that she is dependent on so that she can get a discount on her property taxes.   Per patient the form cannot be signed by NP and that is who her PCP is.  I adv her to call their office because they will have a supervising MD that could possibly sign.  She will and will let us know.  Also advised I will route to Dr. Graciela Husbands and his nurse as well.  The pt has uploaded a picture of the form in the portal.

## 2022-11-21 NOTE — Telephone Encounter (Signed)
good afternoon! I patient on the line returning phone about this:   Pt stated she is currently enrolled in a program in [place] that will assist paying property taxes. Patient stated [facility] advised the documents have to be completed by MD not APP. Pt stated she is willing to drop off the documents to the office, they need to be completed by the end of May. Will forward to MD and nurse.

## 2022-11-23 NOTE — Telephone Encounter (Signed)
Spoke with pt and requested that pt drop physical form off at the office for review.  Pt verbalizes understanding and thanked Charity fundraiser for the call.

## 2022-11-24 NOTE — Progress Notes (Signed)
EPIC Encounter for ICM Monitoring  Patient Name: Susan Peck is a 57 y.o. female Date: 11/24/2022 Primary Care Physican: Alm Bustard, NP Primary Cardiologist: Graciela Husbands ([place] as needed) Electrophysiologist: Joycelyn Schmid Pacing: 99.9%  09/07/2022 Office Weight: 200 lbs   10/20/2022 Weight: 200 lbs 11/24/2022 Weight: 193-195 lbs  Time in AT/AF <0.1 hr/day (<0.1%     Spoke with patient and heart failure questions reviewed.  Transmission results reviewed.  Pt asymptomatic for fluid accumulation.  Reports feeling well at this time and voices no complaints.     Optivol Thoracic impedance suggesting normal fluid levels.   Prescribed:  Furosemide 40 mg take 2 tablets (80 mg total) every morning and 1 tablet (40 mg total) every evening.  Pt self adjust Furosemide when needed.    Labs: 01/26/2022 Creatinine 0.6, BUN 17, Potassium 3.6, Sodium 141, GFR 125 Care Everywhere A complete set of results can be found in Results Review.   Recommendations: No changes and encouraged to call if experiencing any fluid symptoms.   Follow-up plan: ICM clinic phone appointment on 12/25/2022.   91 day device clinic remote transmission 01/01/2023.     EP/Cardiology Office Visits:  Advised to call the office to schedule yearly appt with Dr Graciela Husbands.  Next visit due 10/31/2022 with Dr Graciela Husbands (no recall).   Copy of ICM check sent to Dr. Graciela Husbands.   3 month ICM trend: 11/20/2022.    12-14 Month ICM trend:     Karie Soda, RN 11/24/2022 2:01 PM

## 2022-12-01 ENCOUNTER — Telehealth: Payer: Self-pay | Admitting: Internal Medicine

## 2022-12-01 NOTE — Telephone Encounter (Signed)
Pt left certificate of diability for prop tax exclusion, doc will be left in provider box

## 2022-12-06 NOTE — Telephone Encounter (Signed)
I called patient's PCP [facility] office.  They said the patient has an appointment in 2 days to discuss this form that needs signed.  They have a supervising physician that can sign for the NP.  Fax numbers: [phone] and alternate [phone] attn: Delaney Meigs.  Will fax this form now.

## 2022-12-07 ENCOUNTER — Encounter: Payer: Self-pay | Admitting: Internal Medicine

## 2022-12-08 NOTE — Telephone Encounter (Signed)
Patient is following-up on status of certificate of disability paperwork.

## 2022-12-08 NOTE — Telephone Encounter (Signed)
Spoke with pt and advised she will need to have PCP complete her paperwork as PCP certified the pt's disability in the past.  Pt verbalizes understanding and thanked Charity fundraiser for the call.

## 2022-12-25 ENCOUNTER — Telehealth: Payer: Self-pay

## 2022-12-25 ENCOUNTER — Ambulatory Visit: Payer: 59 | Attending: Internal Medicine

## 2022-12-25 DIAGNOSIS — Z95810 Presence of automatic (implantable) cardiac defibrillator: Secondary | ICD-10-CM

## 2022-12-25 DIAGNOSIS — I5022 Chronic systolic (congestive) heart failure: Secondary | ICD-10-CM

## 2022-12-25 NOTE — Telephone Encounter (Signed)
Remote ICM transmission received.  Attempted call to patient regarding ICM remote transmission and left detailed message per DPR.  Left ICM phone number and advised to return call for any fluid symptoms or questions. Next ICM remote transmission scheduled 01/29/2023.    

## 2022-12-25 NOTE — Progress Notes (Signed)
EPIC Encounter for ICM Monitoring  Patient Name: Susan Peck is a 57 y.o. female Date: 12/25/2022 Primary Care Physican: Alm Bustard, NP Primary Cardiologist: Graciela Husbands ([place] as needed) Electrophysiologist: Joycelyn Schmid Pacing: 99.9%  09/07/2022 Office Weight: 200 lbs   10/20/2022 Weight: 200 lbs 11/24/2022 Weight: 193-195 lbs   Time in AT/AF <0.1 hr/day (<0.1%     Left voice mail with ICM number and encouraged to call if experiencing any fluid symptoms.   Optivol Thoracic impedance suggesting normal fluid levels.   Prescribed:  Furosemide 40 mg take 2 tablets (80 mg total) every morning and 1 tablet (40 mg total) every evening.  Pt self adjust Furosemide when needed.    Labs: 10/03/2022 Creatinine 0.5, BUN 10, Potassium 4.2, Sodium 141, GFR 110 A complete set of results can be found in Results Review.   Recommendations:  Left voice mail with ICM number and encouraged to call if experiencing any fluid symptoms.s.   Follow-up plan: ICM clinic phone appointment on 01/29/2023.   91 day device clinic remote transmission 01/01/2023.     EP/Cardiology Office Visits:  01/26/2023 with Dr Graciela Husbands.  .   Copy of ICM check sent to Dr. Graciela Husbands.   3 month ICM trend: 12/25/2022.    12-14 Month ICM trend:     Karie Soda, RN 12/25/2022 3:42 PM

## 2022-12-26 ENCOUNTER — Other Ambulatory Visit: Payer: Self-pay | Admitting: Internal Medicine

## 2023-01-01 ENCOUNTER — Ambulatory Visit (INDEPENDENT_AMBULATORY_CARE_PROVIDER_SITE_OTHER): Payer: 59

## 2023-01-01 DIAGNOSIS — I428 Other cardiomyopathies: Secondary | ICD-10-CM

## 2023-01-02 LAB — CUP PACEART REMOTE DEVICE CHECK
Battery Remaining Longevity: 20 mo
Battery Voltage: 2.9 V
Brady Statistic AP VP Percent: 0.51 %
Brady Statistic AP VS Percent: 0.01 %
Brady Statistic AS VP Percent: 99.44 %
Brady Statistic AS VS Percent: 0.03 %
Brady Statistic RA Percent Paced: 0.52 %
Brady Statistic RV Percent Paced: 99.93 %
Date Time Interrogation Session: 20240610053327
HighPow Impedance: 75 Ohm
Implantable Lead Connection Status: 753985
Implantable Lead Connection Status: 753985
Implantable Lead Connection Status: 753985
Implantable Lead Implant Date: 20180524
Implantable Lead Implant Date: 20180524
Implantable Lead Implant Date: 20180524
Implantable Lead Location: 753858
Implantable Lead Location: 753859
Implantable Lead Location: 753860
Implantable Lead Model: 4398
Implantable Lead Model: 5076
Implantable Pulse Generator Implant Date: 20180524
Lead Channel Impedance Value: 161.5 Ohm
Lead Channel Impedance Value: 178.5 Ohm
Lead Channel Impedance Value: 189.073
Lead Channel Impedance Value: 189.073
Lead Channel Impedance Value: 212.8 Ohm
Lead Channel Impedance Value: 323 Ohm
Lead Channel Impedance Value: 323 Ohm
Lead Channel Impedance Value: 399 Ohm
Lead Channel Impedance Value: 456 Ohm
Lead Channel Impedance Value: 456 Ohm
Lead Channel Impedance Value: 456 Ohm
Lead Channel Impedance Value: 456 Ohm
Lead Channel Impedance Value: 570 Ohm
Lead Channel Impedance Value: 608 Ohm
Lead Channel Impedance Value: 627 Ohm
Lead Channel Impedance Value: 665 Ohm
Lead Channel Impedance Value: 665 Ohm
Lead Channel Impedance Value: 722 Ohm
Lead Channel Pacing Threshold Amplitude: 0.375 V
Lead Channel Pacing Threshold Amplitude: 0.625 V
Lead Channel Pacing Threshold Amplitude: 0.625 V
Lead Channel Pacing Threshold Pulse Width: 0.4 ms
Lead Channel Pacing Threshold Pulse Width: 0.4 ms
Lead Channel Pacing Threshold Pulse Width: 0.4 ms
Lead Channel Sensing Intrinsic Amplitude: 23.75 mV
Lead Channel Sensing Intrinsic Amplitude: 23.75 mV
Lead Channel Sensing Intrinsic Amplitude: 3.625 mV
Lead Channel Sensing Intrinsic Amplitude: 3.625 mV
Lead Channel Setting Pacing Amplitude: 1 V
Lead Channel Setting Pacing Amplitude: 1.5 V
Lead Channel Setting Pacing Amplitude: 2.5 V
Lead Channel Setting Pacing Pulse Width: 0.4 ms
Lead Channel Setting Pacing Pulse Width: 0.4 ms
Lead Channel Setting Sensing Sensitivity: 0.3 mV
Zone Setting Status: 755011
Zone Setting Status: 755011

## 2023-01-22 ENCOUNTER — Encounter (HOSPITAL_BASED_OUTPATIENT_CLINIC_OR_DEPARTMENT_OTHER): Payer: Self-pay

## 2023-01-22 DIAGNOSIS — G4733 Obstructive sleep apnea (adult) (pediatric): Secondary | ICD-10-CM

## 2023-01-23 ENCOUNTER — Encounter: Payer: Self-pay | Admitting: Internal Medicine

## 2023-01-23 ENCOUNTER — Ambulatory Visit: Payer: 59 | Attending: Internal Medicine | Admitting: Internal Medicine

## 2023-01-23 VITALS — BP 164/89 | HR 69 | Ht 64.0 in | Wt 198.6 lb

## 2023-01-23 DIAGNOSIS — I5022 Chronic systolic (congestive) heart failure: Secondary | ICD-10-CM

## 2023-01-23 DIAGNOSIS — Z95810 Presence of automatic (implantable) cardiac defibrillator: Secondary | ICD-10-CM | POA: Diagnosis not present

## 2023-01-23 DIAGNOSIS — I428 Other cardiomyopathies: Secondary | ICD-10-CM | POA: Diagnosis not present

## 2023-01-23 LAB — CUP PACEART INCLINIC DEVICE CHECK
Battery Remaining Longevity: 20 mo
Battery Voltage: 2.91 V
Brady Statistic AP VP Percent: 4.05 %
Brady Statistic AP VS Percent: 0.01 %
Brady Statistic AS VP Percent: 95.9 %
Brady Statistic AS VS Percent: 0.04 %
Brady Statistic RA Percent Paced: 4.06 %
Brady Statistic RV Percent Paced: 99.9 %
Date Time Interrogation Session: 20240702200411
HighPow Impedance: 70 Ohm
Implantable Lead Connection Status: 753985
Implantable Lead Connection Status: 753985
Implantable Lead Connection Status: 753985
Implantable Lead Implant Date: 20180524
Implantable Lead Implant Date: 20180524
Implantable Lead Implant Date: 20180524
Implantable Lead Location: 753858
Implantable Lead Location: 753859
Implantable Lead Location: 753860
Implantable Lead Model: 4398
Implantable Lead Model: 5076
Implantable Pulse Generator Implant Date: 20180524
Lead Channel Impedance Value: 189.525
Lead Channel Impedance Value: 204.14 Ohm
Lead Channel Impedance Value: 205.114
Lead Channel Impedance Value: 216.848
Lead Channel Impedance Value: 222.34 Ohm
Lead Channel Impedance Value: 361 Ohm
Lead Channel Impedance Value: 399 Ohm
Lead Channel Impedance Value: 418 Ohm
Lead Channel Impedance Value: 456 Ohm
Lead Channel Impedance Value: 475 Ohm
Lead Channel Impedance Value: 475 Ohm
Lead Channel Impedance Value: 475 Ohm
Lead Channel Impedance Value: 570 Ohm
Lead Channel Impedance Value: 665 Ohm
Lead Channel Impedance Value: 684 Ohm
Lead Channel Impedance Value: 722 Ohm
Lead Channel Impedance Value: 741 Ohm
Lead Channel Impedance Value: 779 Ohm
Lead Channel Pacing Threshold Amplitude: 0.375 V
Lead Channel Pacing Threshold Amplitude: 0.5 V
Lead Channel Pacing Threshold Amplitude: 0.5 V
Lead Channel Pacing Threshold Amplitude: 0.5 V
Lead Channel Pacing Threshold Amplitude: 0.625 V
Lead Channel Pacing Threshold Pulse Width: 0.4 ms
Lead Channel Pacing Threshold Pulse Width: 0.4 ms
Lead Channel Pacing Threshold Pulse Width: 0.4 ms
Lead Channel Pacing Threshold Pulse Width: 0.4 ms
Lead Channel Pacing Threshold Pulse Width: 0.4 ms
Lead Channel Sensing Intrinsic Amplitude: 23.75 mV
Lead Channel Sensing Intrinsic Amplitude: 24.5 mV
Lead Channel Sensing Intrinsic Amplitude: 3.375 mV
Lead Channel Sensing Intrinsic Amplitude: 4 mV
Lead Channel Setting Pacing Amplitude: 1 V
Lead Channel Setting Pacing Amplitude: 1.5 V
Lead Channel Setting Pacing Amplitude: 2.5 V
Lead Channel Setting Pacing Pulse Width: 0.4 ms
Lead Channel Setting Pacing Pulse Width: 0.4 ms
Lead Channel Setting Sensing Sensitivity: 0.3 mV
Zone Setting Status: 755011
Zone Setting Status: 755011

## 2023-01-23 NOTE — Progress Notes (Signed)
Patient Care Team: Alm Bustard, NP as PCP - General (Family Medicine) Duke Salvia, MD as PCP - Electrophysiology (Cardiology) Kathleene Hazel, MD as PCP - Cardiology (Cardiology) Davina Poke as Consulting Physician (Optometry)   HPI  Susan Peck is a 57 y.o. female   Seen in followup for ICD-CRT  implanted 5/18 for primary prevention for NICM with interval normalization of LV function  Underwent thyroid lobectomy 2/24-negative for malignancy   Dyspnea on exertion particularly with stairs.  No edema.  Some discomfort over her device; denies calor.  Discomfort is described as tightness with some radiation into the left arm.  Not related to exertion.  Variably compliant with her medications.  DATE TEST EF   9/12 Echo  25-30%   2/17 Cath 35 % Normal CA  2/17 Echo 30-35 %   3/18 Echo  20-25%   3/19 Echo  45-50%   6/21 Echo  55-60%            Date Cr K Mg  8/18  0.57 4.3   3/19 1.07 4.2 1.8  7/19 0.59 3.7   1/19     6/21 0.55 3.6   7/22 0.59 4.2   3/24 0.5 4.2      Past Medical History:  Diagnosis Date   AICD (automatic cardioverter/defibrillator) present 12/13/2016   Anxiety    no meds   Asthma    CHF (congestive heart failure) (HCC)    Depression    no meds   Diabetes mellitus    recent dx 11/17/11 - started med 11/18/11 type 2   GERD (gastroesophageal reflux disease)    Goiter 07/27/2011   non-neoplastic goiter - fine needle aspiration - benign sees dr Lucianne Muss for   Heart murmur    dx 2 yrs ago per pt   Herpes genitalis in women    Hypertension    IBS (irritable bowel syndrome)    tx with diet per pt   Low back pain    history   Obesity    Seasonal allergies    Shortness of breath    occasional - exercise induced   Sleep apnea    cpap broken   Systolic heart failure    May 2011 EF 35-40%, 04/03/11 EF 25-30%, 06/27/11 EF 30-35%    Past Surgical History:  Procedure Laterality Date   ABDOMINAL HYSTERECTOMY     BIV ICD  INSERTION CRT-D N/A 12/13/2016   Procedure: BiV ICD Insertion CRT-D;  Surgeon: Duke Salvia, MD;  Location: [facility] INVASIVE CV LAB;  Service: Cardiovascular;  Laterality: N/A;   cardiac catherization  2004   [place], [place] - Dr Campbell Lerner   CARDIAC CATHETERIZATION     CARDIAC CATHETERIZATION N/A 09/15/2015   Procedure: Right/Left Heart Cath and Coronary Angiography;  Surgeon: Dolores Patty, MD;  Location: [facility] INVASIVE CV LAB;  Service: Cardiovascular;  Laterality: N/A;   COLONOSCOPY WITH PROPOFOL N/A 03/26/2015   Procedure: COLONOSCOPY WITH PROPOFOL;  Surgeon: Jeani Hawking, MD;  Location: [facility] ENDOSCOPY;  Service: Endoscopy;  Laterality: N/A;   colonscopy     CYSTOSCOPY  11/23/2011   Procedure: CYSTOSCOPY;  Surgeon: Delbert Harness, MD;  Location: [facility] ORS;  Service: Gynecology;  Laterality: N/A;   DIAGNOSTIC LAPAROSCOPY     of pelvis   DILATION AND CURETTAGE OF UTERUS  12/2006,  10/2005   hysteroscopy surgery x 2   ESOPHAGOGASTRODUODENOSCOPY (EGD) WITH PROPOFOL N/A 11/12/2020   Procedure: ESOPHAGOGASTRODUODENOSCOPY (EGD) WITH PROPOFOL;  Surgeon: Jeani Hawking, MD;  Location: [facility];  Service: Endoscopy;  Laterality: N/A;   INSERTION OF ICD  12/13/2016   BIV   LAPAROSCOPIC ASSISTED VAGINAL HYSTERECTOMY  11/23/2011   Procedure: LAPAROSCOPIC ASSISTED VAGINAL HYSTERECTOMY;  Surgeon: Delbert Harness, MD;  Location: [facility] OR[facility];  Service: Gynecology;  Laterality: N/A;   NOVASURE ABLATION     10/2005   SALPINGOOPHORECTOMY  11/23/2011   Procedure: SALPINGO OOPHERECTOMY;  Surgeon: Delbert Harness, MD;  Location: [facility] OR[facility];  Service: Gynecology;  Laterality: Bilateral;   SAVORY DILATION N/A 11/12/2020   Procedure: SAVORY DILATION;  Surgeon: Jeani Hawking, MD;  Location: [facility] ENDOSCOPY;  Service: Endoscopy;  Laterality: N/A;   SVD     x 3   TOOTH EXTRACTION N/A 04/05/2017   Procedure: DENTAL EXTRACTIONS OF TEETH FIVE, SEVEN, SIXTEEN, SEVENTEEN;  Surgeon: Ocie Doyne, DDS;  Location: [facility] OR;  Service: Oral Surgery;   Laterality: N/A;   TUBAL LIGATION     WISDOM TOOTH EXTRACTION      Current Outpatient Medications  Medication Sig Dispense Refill   aspirin 81 MG chewable tablet Chew 81 mg by mouth daily.      aspirin-sod bicarb-citric acid (ALKA-SELTZER) 325 MG TBEF tablet Take 650 mg by mouth every 6 (six) hours as needed (indigestion).     atorvastatin (LIPITOR) 40 MG tablet TAKE 1 TABLET BY MOUTH EVERY DAY 90 tablet 3   BD PEN NEEDLE NANO 2ND GEN 32G X 4 MM MISC USE TO INJECT XULTOPHY DAILY 100 each 5   carvedilol (COREG) 25 MG tablet TAKE 1 TABLET BY MOUTH TWICE  DAILY WITH MEALS 200 tablet 2   eplerenone (INSPRA) 25 MG tablet Take 25 mg by mouth daily.     fluticasone (FLONASE) 50 MCG/ACT nasal spray PLACE 1 SPRAY INTO BOTH NOSTRILS 2 (TWO) TIMES DAILY 48 mL 1   furosemide (LASIX) 40 MG tablet TAKE 2 TABS EVERY MORNING AND 1 TAB EVERY EVENING. 270 tablet 0   glucose blood (ONETOUCH VERIO) test strip Insulin dependent. Check blood sugar up to 3 times daily. diag code E11.65 100 each 12   Lancet Devices (ONETOUCH DELICA PLUS LANCING) MISC USE AS DIRECTED TO CHECK BLOOD SUGAR 1 each 2   LINZESS 72 MCG capsule Take 72 mcg by mouth daily.     mupirocin cream (BACTROBAN) 2 % APPLY TO AFFECTED AREA TWICE A DAY 15 g 1   ONETOUCH DELICA LANCETS FINE MISC Insulin dependent. Check blood sugar up to 3 times daily. diag code E11.65 100 each 12   PATADAY 0.2 % SOLN Place 1 drop into both eyes daily. 2.5 mL 4   sacubitril-valsartan (ENTRESTO) 97-103 MG Take 1 tablet by mouth 2 (two) times daily. 180 tablet 2   TRESIBA FLEXTOUCH 100 UNIT/ML FlexTouch Pen Inject 18 Units into the skin every morning.     TRULICITY 1.5 MG/0.5ML SOPN Inject into the skin.     VYZULTA 0.024 % SOLN Place 1 drop into both eyes at bedtime. 5 mL 1   No current facility-administered medications for this visit.    Allergies  Allergen Reactions   Shellfish-Derived Products Anaphylaxis      Review of Systems negative except from HPI  and PMH  Physical Exam BP (!) 164/89   Pulse 69   Ht 5\' 4"  (1.626 m)   Wt 198 lb 9.6 oz (90.1 kg)   SpO2 99%   BMI 34.09 kg/m  Well developed and well nourished in no acute distress HENT normal Neck supple  with JVP-flat Clear Device pocket well healed; without hematoma or erythema.  There is no tethering  Regular rate and rhythm, no  gallop No  murmur Abd-soft with active BS No Clubbing cyanosis  edema Skin-warm and dry A & Oriented  Grossly normal sensory and motor function  ECG P synchronous pacing at 70 Intervals 07/06/1944 QRS lead I and a upright QRS lead V1  Device function is normal. Programming changes   See Paceart for details     8/18 QRSd 140 ms  Assessment and  Plan  NICM interval normalization  CHF chronic diastolic  Hypertension  Diaphragmatic stimulation pacing from poles 1-3  CRT- D [employer]      Treated (previously) sleep apnea  Pocket soreness   Blood pressure is poorly controlled but she says she did not take her medicine this morning.  Asked her to check in follow-up.  Potentially also aggravating this is in fact that her sleep apnea is currently not treated; she has a repeat sleep study pending  . Continue eplerenone 25 Entresto 97 103 carvedilol 25  for GDMT  Euvolemic  Pocket soreness.  No evidence of infection.     Current medicines are reviewed at length with the patient today .  The patient does not  have concerns regarding medicines.

## 2023-01-23 NOTE — Patient Instructions (Signed)
Medication Instructions:  Your physician recommends that you continue on your current medications as directed. Please refer to the Current Medication list given to you today.  *If you need a refill on your cardiac medications before your next appointment, please call your pharmacy*   Lab Work: None ordered.  If you have labs (blood work) drawn today and your tests are completely normal, you will receive your results only by: MyChart Message (if you have MyChart) OR A paper copy in the mail If you have any lab test that is abnormal or we need to change your treatment, we will call you to review the results.   Testing/Procedures: None ordered.    Follow-Up: At [facility], you and your health needs are our priority.  As part of our continuing mission to provide you with exceptional heart care, we have created designated Provider Care Teams.  These Care Teams include your primary Cardiologist (physician) and Advanced Practice Providers (APPs -  Physician Assistants and Nurse Practitioners) who all work together to provide you with the care you need, when you need it.  We recommend signing up for the patient portal called "MyChart".  Sign up information is provided on this After Visit Summary.  MyChart is used to connect with patients for Virtual Visits (Telemedicine).  Patients are able to view lab/test results, encounter notes, upcoming appointments, etc.  Non-urgent messages can be sent to your provider as well.   To learn more about what you can do with MyChart, go to [email or web address].    Your next appointment:   12 months with Dr Graciela Husbands  Other Instructions Please check you blood pressure at home about 2 hours after taking your morning medications.  You can send [place] these results by [facility]

## 2023-01-23 NOTE — Progress Notes (Signed)
Remote ICD transmission.   

## 2023-01-26 ENCOUNTER — Encounter: Payer: 59 | Admitting: Internal Medicine

## 2023-01-29 ENCOUNTER — Ambulatory Visit: Payer: 59

## 2023-01-29 DIAGNOSIS — Z95810 Presence of automatic (implantable) cardiac defibrillator: Secondary | ICD-10-CM | POA: Diagnosis not present

## 2023-01-29 DIAGNOSIS — I5022 Chronic systolic (congestive) heart failure: Secondary | ICD-10-CM | POA: Diagnosis not present

## 2023-01-30 ENCOUNTER — Other Ambulatory Visit (HOSPITAL_COMMUNITY): Payer: Self-pay | Admitting: Internal Medicine

## 2023-02-02 NOTE — Progress Notes (Signed)
EPIC Encounter for ICM Monitoring  Patient Name: Susan Peck is a 57 y.o. female Date: 02/02/2023 Primary Care Physican: Alm Bustard, NP Primary Cardiologist: Graciela Husbands (Bensimhon as needed) Electrophysiologist: Joycelyn Schmid Pacing: 99.9%  09/07/2022 Office Weight: 200 lbs   10/20/2022 Weight: 200 lbs 11/24/2022 Weight: 193-195 lbs   Time in AT/AF <0.1 hr/day (<0.1%     Transmission reviewed.   Optivol Thoracic impedance suggesting intermittent days with possible fluid accumulation within the last month.   Prescribed:  Furosemide 40 mg take 2 tablets (80 mg total) every morning and 1 tablet (40 mg total) every evening.  Pt self adjust Furosemide when needed.    Labs: 10/03/2022 Creatinine 0.5, BUN 10, Potassium 4.2, Sodium 141, GFR 110 A complete set of results can be found in Results Review.   Recommendations:  No Changes.   Follow-up plan: ICM clinic phone appointment on 03/05/2023.   91 day device clinic remote transmission 04/02/2023.     EP/Cardiology Office Visits:  Last EP visit was 01/26/2023 with Dr Graciela Husbands.     Copy of ICM check sent to Dr. Graciela Husbands.   3 month ICM trend: 01/29/2023.    12-14 Month ICM trend:     Karie Soda, RN 02/02/2023 4:32 PM

## 2023-02-20 ENCOUNTER — Ambulatory Visit (HOSPITAL_BASED_OUTPATIENT_CLINIC_OR_DEPARTMENT_OTHER): Payer: 59 | Attending: Family Medicine | Admitting: Internal Medicine

## 2023-02-20 DIAGNOSIS — G4733 Obstructive sleep apnea (adult) (pediatric): Secondary | ICD-10-CM

## 2023-02-25 DIAGNOSIS — G4733 Obstructive sleep apnea (adult) (pediatric): Secondary | ICD-10-CM

## 2023-02-25 NOTE — Procedures (Signed)
    Patient Name: Susan Peck, Susan Peck Date: 02/20/2023 Gender: Female D.O.B: Dec 30, 1965 Age (years): 89 Referring Provider: Alm Bustard NP Height (inches): 64 Interpreting Physician: Jetty Duhamel MD, ABSM Weight (lbs): 195 RPSGT: Shelah Lewandowsky BMI: 33 MRN: [id number] Neck Size: 14.00  CLINICAL INFORMATION The patient is referred for a CPAP titration to treat sleep apnea.  Date of NPSG, Split Night or HST: NPSG 2012  AHI 16/hr  SLEEP STUDY TECHNIQUE As per the AASM Manual for the Scoring of Sleep and Associated Events v2.3 (April 2016) with a hypopnea requiring 4% desaturations.  The channels recorded and monitored were frontal, central and occipital EEG, electrooculogram (EOG), submentalis EMG (chin), nasal and oral airflow, thoracic and abdominal wall motion, anterior tibialis EMG, snore microphone, electrocardiogram, and pulse oximetry. Continuous positive airway pressure (CPAP) was initiated at the beginning of the study and titrated to treat sleep-disordered breathing.  MEDICATIONS Medications self-administered by patient taken the night of the study : CARVEDILOL, ENTRESTO, VYZULTA EYEDROPS  TECHNICIAN COMMENTS Comments added by technician: NONE Comments added by scorer: N/A RESPIRATORY PARAMETERS Optimal PAP Pressure (cm): 13 AHI at Optimal Pressure (/hr): 1.9 Overall Minimal O2 (%): 78.0 Supine % at Optimal Pressure (%): 72 Minimal O2 at Optimal Pressure (%): 86.0   SLEEP ARCHITECTURE The study was initiated at 10:58:51 PM and ended at 5:22:50 AM.  Sleep onset time was 19.4 minutes and the sleep efficiency was 84.4%. The total sleep time was 324 minutes.  The patient spent 7.7% of the night in stage N1 sleep, 67.1% in stage N2 sleep, 0.0% in stage N3 and 25.2% in REM.Stage REM latency was 84.0 minutes  Wake after sleep onset was 40.5. Alpha intrusion was absent. Supine sleep was 80.86%.  CARDIAC DATA The 2 lead EKG demonstrated sinus rhythm, pacemaker  generated. The mean heart rate was 63.3 beats per minute. Other EKG findings include: PVCs.  LEG MOVEMENT DATA The total Periodic Limb Movements of Sleep (PLMS) were 0. The PLMS index was 0.0. A PLMS index of <15 is considered normal in adults.  IMPRESSIONS - The optimal PAP pressure was 13 cm of water. - Severe oxygen desaturations were observed during this titration (min O2 = 78.0%). Mean O2 saturation on CPAP 13 was 93.1%. - The patient snored with moderate snoring volume during this titration study. - 2-lead EKG demonstrated: PVCs, Paced rhythm. - Clinically significant periodic limb movements were not noted during this study. Arousals associated with PLMs were rare.  DIAGNOSIS - Obstructive Sleep Apnea (G47.33)  RECOMMENDATIONS - Trial of CPAP therapy on 13 cm H2O or autopap 8-18. - Patient wore an Extra Small size [employer] Full Face Evora Full mask and heated humidification. - Be careful with alcohol, sedatives and other CNS depressants that may worsen sleep apnea and disrupt normal sleep architecture. - Sleep hygiene should be reviewed to assess factors that may improve sleep quality. - Weight management and regular exercise should be initiated or continued.  [Electronically signed] 02/25/2023 10:55 AM  Jetty Duhamel MD, ABSM Diplomate, American Board of Sleep Medicine NPI: [id number]                         Jetty Duhamel Diplomate, American Board of Sleep Medicine  ELECTRONICALLY SIGNED ON:  02/25/2023, 10:51 AM [facility] PH: [phone]   FX: [phone] ACCREDITED BY THE AMERICAN ACADEMY OF SLEEP MEDICINE

## 2023-02-26 ENCOUNTER — Telehealth: Payer: Self-pay | Admitting: Internal Medicine

## 2023-02-26 NOTE — Telephone Encounter (Signed)
Pt would like apt with Dr. Graciela Husbands to discuss her tens unit. States he told her she needed to bring it in to let him look at it first.   Patient also advised [employer] can advise, given # to [employer].Marland Kitchen

## 2023-02-26 NOTE — Telephone Encounter (Signed)
Pt states at her last appt with Dr. Graciela Husbands, he talked about them seeing if her TENS unit and her  defibrillator are compatible. Please advise.

## 2023-02-27 NOTE — Telephone Encounter (Signed)
[employer] called who advised they do NOT recommend using a TENS unit d/t risk of inappropriate shock with oversensing.

## 2023-02-27 NOTE — Telephone Encounter (Signed)
Attempted phone call to pt.  OK to leave detailed VM message per Epic and advised per device RN and MDT tech is it not recommended to use a TENS unit due to the risk of inappropriate shocks with oversensing.  Pt may contact office with any further questions or concerns.

## 2023-02-27 NOTE — Telephone Encounter (Signed)
Spoke to patient and advised about TENS unit and not recommended per [employer] with increased risk of inappropriate shock. Patient voiced understanding and thanked me for call.

## 2023-03-12 NOTE — Progress Notes (Signed)
No ICM remote transmission received for 03/05/2023 and next ICM transmission scheduled for 03/27/2023.

## 2023-03-29 NOTE — Progress Notes (Signed)
No ICM remote transmission received for 03/27/2023 and next ICM transmission scheduled for 04/16/2023.

## 2023-04-02 ENCOUNTER — Ambulatory Visit (INDEPENDENT_AMBULATORY_CARE_PROVIDER_SITE_OTHER): Payer: 59

## 2023-04-02 DIAGNOSIS — I428 Other cardiomyopathies: Secondary | ICD-10-CM

## 2023-04-02 DIAGNOSIS — I5022 Chronic systolic (congestive) heart failure: Secondary | ICD-10-CM | POA: Diagnosis not present

## 2023-04-03 LAB — CUP PACEART REMOTE DEVICE CHECK
Battery Remaining Longevity: 18 mo
Battery Voltage: 2.9 V
Brady Statistic AP VP Percent: 0.45 %
Brady Statistic AP VS Percent: 0.01 %
Brady Statistic AS VP Percent: 99.46 %
Brady Statistic AS VS Percent: 0.08 %
Brady Statistic RA Percent Paced: 0.46 %
Brady Statistic RV Percent Paced: 99.86 %
Date Time Interrogation Session: 20240909022725
HighPow Impedance: 67 Ohm
Implantable Lead Connection Status: 753985
Implantable Lead Connection Status: 753985
Implantable Lead Connection Status: 753985
Implantable Lead Implant Date: 20180524
Implantable Lead Implant Date: 20180524
Implantable Lead Implant Date: 20180524
Implantable Lead Location: 753858
Implantable Lead Location: 753859
Implantable Lead Location: 753860
Implantable Lead Model: 4398
Implantable Lead Model: 5076
Implantable Pulse Generator Implant Date: 20180524
Lead Channel Impedance Value: 170.472
Lead Channel Impedance Value: 189.073
Lead Channel Impedance Value: 189.525
Lead Channel Impedance Value: 201.488
Lead Channel Impedance Value: 212.8 Ohm
Lead Channel Impedance Value: 323 Ohm
Lead Channel Impedance Value: 361 Ohm
Lead Channel Impedance Value: 399 Ohm
Lead Channel Impedance Value: 456 Ohm
Lead Channel Impedance Value: 456 Ohm
Lead Channel Impedance Value: 456 Ohm
Lead Channel Impedance Value: 456 Ohm
Lead Channel Impedance Value: 570 Ohm
Lead Channel Impedance Value: 627 Ohm
Lead Channel Impedance Value: 665 Ohm
Lead Channel Impedance Value: 684 Ohm
Lead Channel Impedance Value: 684 Ohm
Lead Channel Impedance Value: 722 Ohm
Lead Channel Pacing Threshold Amplitude: 0.375 V
Lead Channel Pacing Threshold Amplitude: 0.625 V
Lead Channel Pacing Threshold Amplitude: 0.625 V
Lead Channel Pacing Threshold Pulse Width: 0.4 ms
Lead Channel Pacing Threshold Pulse Width: 0.4 ms
Lead Channel Pacing Threshold Pulse Width: 0.4 ms
Lead Channel Sensing Intrinsic Amplitude: 2.625 mV
Lead Channel Sensing Intrinsic Amplitude: 2.625 mV
Lead Channel Sensing Intrinsic Amplitude: 22.125 mV
Lead Channel Sensing Intrinsic Amplitude: 22.125 mV
Lead Channel Setting Pacing Amplitude: 1 V
Lead Channel Setting Pacing Amplitude: 1.5 V
Lead Channel Setting Pacing Amplitude: 2.5 V
Lead Channel Setting Pacing Pulse Width: 0.4 ms
Lead Channel Setting Pacing Pulse Width: 0.4 ms
Lead Channel Setting Sensing Sensitivity: 0.3 mV
Zone Setting Status: 755011
Zone Setting Status: 755011

## 2023-04-19 NOTE — Progress Notes (Signed)
Remote ICD transmission.   

## 2023-04-24 NOTE — Progress Notes (Signed)
No ICM remote transmission received for 04/16/2023 and next ICM transmission scheduled for 05/07/2023.

## 2023-05-07 ENCOUNTER — Ambulatory Visit: Payer: 59 | Attending: Internal Medicine

## 2023-05-07 DIAGNOSIS — I5022 Chronic systolic (congestive) heart failure: Secondary | ICD-10-CM

## 2023-05-07 DIAGNOSIS — Z95810 Presence of automatic (implantable) cardiac defibrillator: Secondary | ICD-10-CM

## 2023-05-11 ENCOUNTER — Telehealth: Payer: Self-pay

## 2023-05-11 NOTE — Telephone Encounter (Signed)
Remote ICM transmission received.  Attempted call to patient regarding ICM remote transmission and left detailed message per DPR.  Left ICM phone number and advised to return call for any fluid symptoms or questions. Next ICM remote transmission scheduled 06/11/2023.

## 2023-05-11 NOTE — Progress Notes (Signed)
EPIC Encounter for ICM Monitoring  Patient Name: MEERAB SPIELMANN is a 57 y.o. female Date: 05/11/2023 Primary Care Physican: Alm Bustard, NP Electrophysiologist: Joycelyn Schmid Pacing: 99.9%  09/07/2022 Office Weight: 200 lbs   10/20/2022 Weight: 200 lbs 11/24/2022 Weight: 193-195 lbs   Time in AT/AF 0.0 hr/day (0.0%)     Attempted call to patient and unable to reach.  Left detailed message per DPR regarding transmission. Transmission reviewed.    Optivol Thoracic impedance suggesting intermittent days with possible fluid accumulation within the last month.   Prescribed:  Furosemide 40 mg take 2 tablets (80 mg total) every morning and 1 tablet (40 mg total) every evening.  Pt self adjust Furosemide when needed.    Labs: 10/03/2022 Creatinine 0.5, BUN 10, Potassium 4.2, Sodium 141, GFR 110 A complete set of results can be found in Results Review.   Recommendations:  Left voice mail with ICM number and encouraged to call if experiencing any fluid symptoms.   Follow-up plan: ICM clinic phone appointment on 03/05/2023.   91 day device clinic remote transmission 07/02/2023.     EP/Cardiology Office Visits:  Last EP visit was 01/26/2023 with Dr Graciela Husbands.   Recall 09/02/2023 with Dr Clifton James.   Copy of ICM check sent to Dr. Graciela Husbands.   3 month ICM trend: 05/07/2023.    12-14 Month ICM trend:     Karie Soda, RN 05/11/2023 2:49 PM

## 2023-05-18 ENCOUNTER — Ambulatory Visit (INDEPENDENT_AMBULATORY_CARE_PROVIDER_SITE_OTHER): Payer: 59 | Admitting: Obstetrics and Gynecology

## 2023-05-18 ENCOUNTER — Encounter: Payer: Self-pay | Admitting: Obstetrics and Gynecology

## 2023-05-18 VITALS — BP 136/86 | HR 74 | Ht 64.0 in | Wt 195.0 lb

## 2023-05-18 DIAGNOSIS — A6004 Herpesviral vulvovaginitis: Secondary | ICD-10-CM | POA: Diagnosis not present

## 2023-05-18 MED ORDER — VALACYCLOVIR HCL 500 MG PO TABS
ORAL_TABLET | ORAL | 3 refills | Status: DC
Start: 2023-05-18 — End: 2024-02-27

## 2023-05-18 NOTE — Assessment & Plan Note (Signed)
Patient very tearful about having to discuss this with her partner. They have not discuss her history before and she is worried about how he may respond. Discussed treatment and suppression to decrease risk of exposure to partner. Serology of partner would be helpful to decrease risk of unnecessary medication Resources provided re: HSV discussion in relationships All questions answered

## 2023-05-18 NOTE — Progress Notes (Signed)
57 y.o. [id number] female s/p hysterectomy here for vulvar lesion.Hx of HSV-2, dx 2015.  She presents with vulvar lesion. No recent outbreaks and notes that she is not having much pain with this lesion. Sexually active: yes, has been avoiding intercourse with her partner wince outbreak. They took pictures of it recently.   OB History  Gravida Para Term Preterm AB Living  7 3     3 3   SAB IAB Ectopic Multiple Live Births  1            # Outcome Date GA Lbr Len/2nd Weight Sex Type Anes PTL Lv  7 SAB           6 AB           5 AB           4 Para           3 Para           2 Para           1 Gravida             Past Medical History:  Diagnosis Date   AICD (automatic cardioverter/defibrillator) present 12/13/2016   Anxiety    no meds   Asthma    CHF (congestive heart failure) (HCC)    Depression    no meds   Diabetes mellitus    recent dx 11/17/11 - started med 11/18/11 type 2   GERD (gastroesophageal reflux disease)    Goiter 07/27/2011   non-neoplastic goiter - fine needle aspiration - benign sees dr Lucianne Muss for   Heart murmur    dx 2 yrs ago per pt   Herpes genitalis in women    Hypertension    IBS (irritable bowel syndrome)    tx with diet per pt   Low back pain    history   Obesity    Seasonal allergies    Shortness of breath    occasional - exercise induced   Sleep apnea    cpap broken   Systolic heart failure    May 2011 EF 35-40%, 04/03/11 EF 25-30%, 06/27/11 EF 30-35%    Past Surgical History:  Procedure Laterality Date   ABDOMINAL HYSTERECTOMY     BIV ICD INSERTION CRT-D N/A 12/13/2016   Procedure: BiV ICD Insertion CRT-D;  Surgeon: Duke Salvia, MD;  Location: [facility] INVASIVE CV LAB;  Service: Cardiovascular;  Laterality: N/A;   cardiac catherization  2004   [place], [place] - Dr Campbell Lerner   CARDIAC CATHETERIZATION     CARDIAC CATHETERIZATION N/A 09/15/2015   Procedure: Right/Left Heart Cath and Coronary Angiography;  Surgeon: Dolores Patty, MD;   Location: [facility] INVASIVE CV LAB;  Service: Cardiovascular;  Laterality: N/A;   COLONOSCOPY WITH PROPOFOL N/A 03/26/2015   Procedure: COLONOSCOPY WITH PROPOFOL;  Surgeon: Jeani Hawking, MD;  Location: [facility] ENDOSCOPY;  Service: Endoscopy;  Laterality: N/A;   colonscopy     CYSTOSCOPY  11/23/2011   Procedure: CYSTOSCOPY;  Surgeon: Delbert Harness, MD;  Location: [facility] ORS;  Service: Gynecology;  Laterality: N/A;   DIAGNOSTIC LAPAROSCOPY     of pelvis   DILATION AND CURETTAGE OF UTERUS  12/2006,  10/2005   hysteroscopy surgery x 2   ESOPHAGOGASTRODUODENOSCOPY (EGD) WITH PROPOFOL N/A 11/12/2020   Procedure: ESOPHAGOGASTRODUODENOSCOPY (EGD) WITH PROPOFOL;  Surgeon: Jeani Hawking, MD;  Location: [facility];  Service: Endoscopy;  Laterality: N/A;   INSERTION OF ICD  12/13/2016   BIV  LAPAROSCOPIC ASSISTED VAGINAL HYSTERECTOMY  11/23/2011   Procedure: LAPAROSCOPIC ASSISTED VAGINAL HYSTERECTOMY;  Surgeon: Delbert Harness, MD;  Location: [facility];  Service: Gynecology;  Laterality: N/A;   NOVASURE ABLATION     10/2005   SALPINGOOPHORECTOMY  11/23/2011   Procedure: SALPINGO OOPHERECTOMY;  Surgeon: Delbert Harness, MD;  Location: [facility];  Service: Gynecology;  Laterality: Bilateral;   SAVORY DILATION N/A 11/12/2020   Procedure: SAVORY DILATION;  Surgeon: Jeani Hawking, MD;  Location: [facility];  Service: Endoscopy;  Laterality: N/A;   SVD     x 3   TOOTH EXTRACTION N/A 04/05/2017   Procedure: DENTAL EXTRACTIONS OF TEETH FIVE, SEVEN, SIXTEEN, SEVENTEEN;  Surgeon: Ocie Doyne, DDS;  Location: [facility] OR;  Service: Oral Surgery;  Laterality: N/A;   TUBAL LIGATION     WISDOM TOOTH EXTRACTION      Current Outpatient Medications on File Prior to Visit  Medication Sig Dispense Refill   aspirin 81 MG chewable tablet Chew 81 mg by mouth daily.      aspirin-sod bicarb-citric acid (ALKA-SELTZER) 325 MG TBEF tablet Take 650 mg by mouth every 6 (six) hours as needed (indigestion).     atorvastatin (LIPITOR) 40 MG tablet TAKE 1  TABLET BY MOUTH EVERY DAY 90 tablet 3   BD PEN NEEDLE NANO 2ND GEN 32G X 4 MM MISC USE TO INJECT XULTOPHY DAILY 100 each 5   carvedilol (COREG) 25 MG tablet TAKE 1 TABLET BY MOUTH TWICE  DAILY WITH MEALS 200 tablet 2   clobetasol ointment (TEMOVATE) 0.05 % Apply 1 Application topically 2 (two) times daily.     eplerenone (INSPRA) 25 MG tablet Take 25 mg by mouth daily.     fluticasone (FLONASE) 50 MCG/ACT nasal spray PLACE 1 SPRAY INTO BOTH NOSTRILS 2 (TWO) TIMES DAILY 48 mL 1   furosemide (LASIX) 40 MG tablet TAKE 2 TABS EVERY MORNING AND 1 TAB EVERY EVENING. 270 tablet 0   glucose blood (ONETOUCH VERIO) test strip Insulin dependent. Check blood sugar up to 3 times daily. diag code E11.65 100 each 12   Lancet Devices (ONETOUCH DELICA PLUS LANCING) MISC USE AS DIRECTED TO CHECK BLOOD SUGAR 1 each 2   LINZESS 72 MCG capsule Take 72 mcg by mouth daily.     mupirocin cream (BACTROBAN) 2 % APPLY TO AFFECTED AREA TWICE A DAY 15 g 1   ONETOUCH DELICA LANCETS FINE MISC Insulin dependent. Check blood sugar up to 3 times daily. diag code E11.65 100 each 12   PATADAY 0.2 % SOLN Place 1 drop into both eyes daily. 2.5 mL 4   sacubitril-valsartan (ENTRESTO) 97-103 MG TAKE 1 TABLET BY MOUTH TWICE A DAY 60 tablet 6   TRESIBA FLEXTOUCH 100 UNIT/ML FlexTouch Pen Inject 18 Units into the skin every morning.     TRULICITY 1.5 MG/0.5ML SOPN Inject into the skin.     VYZULTA 0.024 % SOLN Place 1 drop into both eyes at bedtime. 5 mL 1   No current facility-administered medications on file prior to visit.    Allergies  Allergen Reactions   Shellfish-Derived Products Anaphylaxis      PE Today's Vitals   05/18/23 0847  BP: 136/86  Pulse: 74  SpO2: 97%  Weight: 195 lb (88.5 kg)  Height: 5\' 4"  (1.626 m)   Body mass index is 33.47 kg/m.  Physical Exam Vitals reviewed. Exam conducted with a chaperone present.  Constitutional:      General: She is not in acute distress.  Appearance: Normal appearance.   HENT:     Head: Normocephalic and atraumatic.     Nose: Nose normal.  Eyes:     Extraocular Movements: Extraocular movements intact.     Conjunctiva/sclera: Conjunctivae normal.  Pulmonary:     Effort: Pulmonary effort is normal.  Genitourinary:    General: Normal vulva.     Exam position: Lithotomy position.     Vagina: Normal. No vaginal discharge.     Adnexa: Right adnexa normal and left adnexa normal.       Comments: Cluster of shallow ulcers along right vulva. Cervix and uterus absent. Musculoskeletal:        General: Normal range of motion.     Cervical back: Normal range of motion.  Neurological:     General: No focal deficit present.     Mental Status: She is alert.  Psychiatric:        Mood and Affect: Mood normal.        Behavior: Behavior normal.       Assessment and Plan:        Herpes simplex vulvovaginitis Assessment & Plan: Patient very tearful about having to discuss this with her partner. They have not discuss her history before and she is worried about how he may respond. Discussed treatment and suppression to decrease risk of exposure to partner. Serology of partner would be helpful to decrease risk of unnecessary medication Resources provided re: HSV discussion in relationships All questions answered  Orders: -     valACYclovir HCl; Take 1 tablet by mouth twice a day for 3 days. Then take 1 tablet by mouth daily.  Dispense: 90 tablet; Refill: 3     Rosalyn Gess, MD

## 2023-06-11 ENCOUNTER — Ambulatory Visit: Payer: 59 | Attending: Internal Medicine

## 2023-06-11 ENCOUNTER — Telehealth: Payer: Self-pay

## 2023-06-11 DIAGNOSIS — Z95810 Presence of automatic (implantable) cardiac defibrillator: Secondary | ICD-10-CM

## 2023-06-11 DIAGNOSIS — I5022 Chronic systolic (congestive) heart failure: Secondary | ICD-10-CM | POA: Diagnosis not present

## 2023-06-11 NOTE — Telephone Encounter (Signed)
 Remote ICM transmission received.  Attempted call to patient regarding ICM remote transmission and left detailed message per DPR.  Left ICM phone number and advised to return call for any fluid symptoms or questions. Next ICM remote transmission scheduled 07/16/2023.

## 2023-06-11 NOTE — Progress Notes (Signed)
EPIC Encounter for ICM Monitoring  Patient Name: Susan Peck is a 57 y.o. female Date: 06/11/2023 Primary Care Physican: Alm Bustard, NP Electrophysiologist: Joycelyn Schmid Pacing: 99.9%  09/07/2022 Office Weight: 200 lbs   10/20/2022 Weight: 200 lbs 11/24/2022 Weight: 193-195 lbs   Time in AT/AF <0.1 hr/day (<0.1%)     Attempted call to patient and unable to reach.  Left detailed message per DPR regarding transmission. Transmission reviewed.    Optivol Thoracic impedance suggesting possible fluid accumulation starting 11/13 but trending back toward baseline   Prescribed:  Furosemide 40 mg take 2 tablets (80 mg total) every morning and 1 tablet (40 mg total) every evening.  Pt self adjust Furosemide when needed.    Labs: 10/03/2022 Creatinine 0.5, BUN 10, Potassium 4.2, Sodium 141, GFR 110 A complete set of results can be found in Results Review.   Recommendations:  Left voice mail with ICM number and encouraged to call if experiencing any fluid symptoms.   Follow-up plan: ICM clinic phone appointment on 07/16/2023.   91 day device clinic remote transmission 07/02/2023.     EP/Cardiology Office Visits:  Next EP visit due 01/2024 with Dr Graciela Husbands (no recall).   Recall 09/02/2023 with Dr Clifton James.   Copy of ICM check sent to Dr. Graciela Husbands.   3 month ICM trend: 06/11/2023.    12-14 Month ICM trend:     Karie Soda, RN 06/11/2023 11:07 AM

## 2023-07-02 ENCOUNTER — Ambulatory Visit (INDEPENDENT_AMBULATORY_CARE_PROVIDER_SITE_OTHER): Payer: 59

## 2023-07-02 DIAGNOSIS — I5022 Chronic systolic (congestive) heart failure: Secondary | ICD-10-CM | POA: Diagnosis not present

## 2023-07-02 DIAGNOSIS — I428 Other cardiomyopathies: Secondary | ICD-10-CM

## 2023-07-03 LAB — CUP PACEART REMOTE DEVICE CHECK
Battery Remaining Longevity: 15 mo
Battery Voltage: 2.89 V
Brady Statistic AP VP Percent: 3.06 %
Brady Statistic AP VS Percent: 0.02 %
Brady Statistic AS VP Percent: 96.8 %
Brady Statistic AS VS Percent: 0.13 %
Brady Statistic RA Percent Paced: 3.07 %
Brady Statistic RV Percent Paced: 99.78 %
Date Time Interrogation Session: 20241209043824
HighPow Impedance: 79 Ohm
Implantable Lead Connection Status: 753985
Implantable Lead Connection Status: 753985
Implantable Lead Connection Status: 753985
Implantable Lead Implant Date: 20180524
Implantable Lead Implant Date: 20180524
Implantable Lead Implant Date: 20180524
Implantable Lead Location: 753858
Implantable Lead Location: 753859
Implantable Lead Location: 753860
Implantable Lead Model: 4398
Implantable Lead Model: 5076
Implantable Pulse Generator Implant Date: 20180524
Lead Channel Impedance Value: 178.5 Ohm
Lead Channel Impedance Value: 182.205
Lead Channel Impedance Value: 204.14 Ohm
Lead Channel Impedance Value: 212.8 Ohm
Lead Channel Impedance Value: 218.087
Lead Channel Impedance Value: 323 Ohm
Lead Channel Impedance Value: 399 Ohm
Lead Channel Impedance Value: 418 Ohm
Lead Channel Impedance Value: 456 Ohm
Lead Channel Impedance Value: 456 Ohm
Lead Channel Impedance Value: 513 Ohm
Lead Channel Impedance Value: 513 Ohm
Lead Channel Impedance Value: 570 Ohm
Lead Channel Impedance Value: 608 Ohm
Lead Channel Impedance Value: 665 Ohm
Lead Channel Impedance Value: 684 Ohm
Lead Channel Impedance Value: 722 Ohm
Lead Channel Impedance Value: 779 Ohm
Lead Channel Pacing Threshold Amplitude: 0.375 V
Lead Channel Pacing Threshold Amplitude: 0.5 V
Lead Channel Pacing Threshold Amplitude: 0.75 V
Lead Channel Pacing Threshold Pulse Width: 0.4 ms
Lead Channel Pacing Threshold Pulse Width: 0.4 ms
Lead Channel Pacing Threshold Pulse Width: 0.4 ms
Lead Channel Sensing Intrinsic Amplitude: 23.5 mV
Lead Channel Sensing Intrinsic Amplitude: 23.5 mV
Lead Channel Sensing Intrinsic Amplitude: 3 mV
Lead Channel Sensing Intrinsic Amplitude: 3 mV
Lead Channel Setting Pacing Amplitude: 1 V
Lead Channel Setting Pacing Amplitude: 1.5 V
Lead Channel Setting Pacing Amplitude: 2.5 V
Lead Channel Setting Pacing Pulse Width: 0.4 ms
Lead Channel Setting Pacing Pulse Width: 0.4 ms
Lead Channel Setting Sensing Sensitivity: 0.3 mV
Zone Setting Status: 755011
Zone Setting Status: 755011

## 2023-07-14 ENCOUNTER — Emergency Department (HOSPITAL_COMMUNITY)
Admission: EM | Admit: 2023-07-14 | Discharge: 2023-07-14 | Disposition: A | Discharge status: Home / Self Care | Payer: 59 | Attending: Emergency Medicine | Admitting: Emergency Medicine

## 2023-07-14 DIAGNOSIS — M5441 Lumbago with sciatica, right side: Secondary | ICD-10-CM | POA: Diagnosis present

## 2023-07-14 DIAGNOSIS — J45909 Unspecified asthma, uncomplicated: Secondary | ICD-10-CM | POA: Insufficient documentation

## 2023-07-14 DIAGNOSIS — I509 Heart failure, unspecified: Secondary | ICD-10-CM | POA: Diagnosis not present

## 2023-07-14 DIAGNOSIS — M5431 Sciatica, right side: Secondary | ICD-10-CM

## 2023-07-14 DIAGNOSIS — Z7982 Long term (current) use of aspirin: Secondary | ICD-10-CM | POA: Diagnosis not present

## 2023-07-14 DIAGNOSIS — E119 Type 2 diabetes mellitus without complications: Secondary | ICD-10-CM | POA: Diagnosis not present

## 2023-07-14 DIAGNOSIS — Z79899 Other long term (current) drug therapy: Secondary | ICD-10-CM | POA: Diagnosis not present

## 2023-07-14 MED ORDER — CYCLOBENZAPRINE HCL 10 MG PO TABS
10.0000 mg | ORAL_TABLET | Freq: Two times a day (BID) | ORAL | 0 refills | Status: AC | PRN
Start: 2023-07-14 — End: ?

## 2023-07-14 MED ORDER — PREDNISONE 20 MG PO TABS
ORAL_TABLET | ORAL | 0 refills | Status: AC
Start: 2023-07-14 — End: ?

## 2023-07-14 NOTE — ED Triage Notes (Signed)
Pt states that since Sunday she's been having pain in her hip radiating down her R leg. Pt denies known injury.

## 2023-07-14 NOTE — ED Provider Notes (Signed)
[place] EMERGENCY DEPARTMENT AT [facility] Provider Note   CSN: [id number] Arrival date & time: 07/14/23  1420     History  Chief Complaint  Patient presents with   Sciatica    Susan Peck is a 57 y.o. female.  The history is provided by the patient and medical records. No language interpreter was used.     57 year old female with hx of DM, CHF, chronic hip pain, asthma, restless leg syndrome here with radicular leg pain.  Report for the past 5 days she notices pain to her lower back radiates down her R leg down to her foot, worse with layin or with certain position.  Pain is moderate to severe and not improved with OTC medication along with heat/ice.  No fever, chills, dysuria, bowel/bladder incontinence or saddle anesthesia.  No dysuria or hematuria. No trauma.  No hx of active cancer or IVDU.   Home Medications Prior to Admission medications   Medication Sig Start Date End Date Taking? Authorizing Provider  aspirin 81 MG chewable tablet Chew 81 mg by mouth daily.     [provider]  aspirin-sod bicarb-citric acid (ALKA-SELTZER) 325 MG TBEF tablet Take 650 mg by mouth every 6 (six) hours as needed (indigestion).    [provider]  atorvastatin (LIPITOR) 40 MG tablet TAKE 1 TABLET BY MOUTH EVERY DAY 04/19/22   Adron Bene, MD  BD PEN NEEDLE NANO 2ND GEN 32G X 4 MM MISC USE TO INJECT XULTOPHY DAILY 08/15/21   Quincy Simmonds, MD  carvedilol (COREG) 25 MG tablet TAKE 1 TABLET BY MOUTH TWICE  DAILY WITH MEALS 12/27/22   Duke Salvia, MD  clobetasol ointment (TEMOVATE) 0.05 % Apply 1 Application topically 2 (two) times daily. 01/30/23   [provider]  eplerenone (INSPRA) 25 MG tablet Take 25 mg by mouth daily.    [provider]  fluticasone (FLONASE) 50 MCG/ACT nasal spray PLACE 1 SPRAY INTO BOTH NOSTRILS 2 (TWO) TIMES DAILY 06/15/21   Eliezer Bottom, MD  furosemide (LASIX) 40 MG tablet TAKE 2 TABS EVERY MORNING AND 1 TAB EVERY  EVENING. 12/12/21   Aslam, Leanna Sato, MD  glucose blood (ONETOUCH VERIO) test strip Insulin dependent. Check blood sugar up to 3 times daily. diag code E11.65 08/04/20   Eliezer Bottom, MD  Lancet Devices [facility] DELICA PLUS LANCING) MISC USE AS DIRECTED TO CHECK BLOOD SUGAR 06/30/19   Eliezer Bottom, MD  LINZESS 72 MCG capsule Take 72 mcg by mouth daily. 02/07/22   [provider]  mupirocin cream (BACTROBAN) 2 % APPLY TO AFFECTED AREA TWICE A DAY 05/26/21   Eliezer Bottom, MD  ONETOUCH DELICA LANCETS FINE MISC Insulin dependent. Check blood sugar up to 3 times daily. diag code E11.65 03/08/18   Burns Spain, MD  PATADAY 0.2 % SOLN Place 1 drop into both eyes daily. 01/09/22   Eliezer Bottom, MD  sacubitril-valsartan (ENTRESTO) 97-103 MG TAKE 1 TABLET BY MOUTH TWICE A DAY 01/30/23   Kathleene Hazel, MD  TRESIBA FLEXTOUCH 100 UNIT/ML FlexTouch Pen Inject 18 Units into the skin every morning.    [provider]  TRULICITY 1.5 MG/0.5ML SOPN Inject into the skin.    [provider]  valACYclovir (VALTREX) 500 MG tablet Take 1 tablet by mouth twice a day for 3 days. Then take 1 tablet by mouth daily. 05/18/23   Hines, Lennox Solders, MD  VYZULTA 0.024 % SOLN Place 1 drop into both eyes at bedtime. 09/30/21  Eliezer Bottom, MD      Allergies    Shellfish-derived products    Review of Systems   Review of Systems  All other systems reviewed and are negative.   Physical Exam Updated Vital Signs BP (!) 177/86   Pulse 86   Temp 97.9 F (36.6 C)   Resp 18   SpO2 94%  Physical Exam Vitals and nursing note reviewed.  Constitutional:      General: She is not in acute distress.    Appearance: She is well-developed. She is obese.  HENT:     Head: Atraumatic.  Eyes:     Conjunctiva/sclera: Conjunctivae normal.  Pulmonary:     Effort: Pulmonary effort is normal.  Abdominal:     Palpations: Abdomen is soft.     Tenderness: There is no abdominal tenderness.  Musculoskeletal:         General: Tenderness (ttp lower back with positive for RLE raise) present.     Cervical back: Neck supple.  Skin:    Capillary Refill: Capillary refill takes less than 2 seconds.     Findings: No rash.  Neurological:     Mental Status: She is alert. Mental status is at baseline.  Psychiatric:        Mood and Affect: Mood normal.     ED Results / Procedures / Treatments   Labs (all labs ordered are listed, but only abnormal results are displayed) Labs Reviewed - No data to display  EKG None  Radiology No results found.  Procedures Procedures    Medications Ordered in ED Medications - No data to display  ED Course/ Medical Decision Making/ A&P                                 Medical Decision Making  BP (!) 177/86   Pulse 86   Temp 97.9 F (36.6 C)   Resp 18   SpO2 94%   30:78 PM 57 year old female with hx of DM, CHF, chronic hip pain, asthma, restless leg syndrome here with radicular leg pain.  Report for the past 5 days she notices pain to her lower back radiates down her R leg down to her foot, worse with layin or with certain position.  Pain is moderate to severe and not improved with OTC medication along with heat/ice.  No fever, chills, dysuria, bowel/bladder incontinence or saddle anesthesia.  No dysuria or hematuria. No trauma.  No hx of active cancer or IVDU.   On exam pt with ttp of lower back with positive straight leg raise to RLE.  Pt is NVI.    Sxs suggestive of sciatica.  Pt with hx of DM but well controlled.  Will give short course of steroid along with muscle relaxant .  NSAIDs considered.  Doubt dissection, kidney stone, caudal equina.         Final Clinical Impression(s) / ED Diagnoses Final diagnoses:  Right sided sciatica    Rx / DC Orders ED Discharge Orders          Ordered    predniSONE (DELTASONE) 20 MG tablet        07/14/23 1454    cyclobenzaprine (FLEXERIL) 10 MG tablet  2 times daily PRN        07/14/23 1454               Fayrene Helper, PA-C 07/14/23 1455    Derwood Kaplan, MD  07/15/23 1643  

## 2023-07-16 ENCOUNTER — Ambulatory Visit: Payer: 59 | Attending: Internal Medicine

## 2023-07-16 DIAGNOSIS — Z95810 Presence of automatic (implantable) cardiac defibrillator: Secondary | ICD-10-CM

## 2023-07-16 DIAGNOSIS — I5022 Chronic systolic (congestive) heart failure: Secondary | ICD-10-CM

## 2023-07-17 ENCOUNTER — Telehealth: Payer: Self-pay

## 2023-07-17 NOTE — Telephone Encounter (Signed)
Remote ICM transmission received.  Attempted call to patient regarding ICM remote transmission and no answer.  

## 2023-07-17 NOTE — Progress Notes (Signed)
EPIC Encounter for ICM Monitoring  Patient Name: Susan Peck is a 57 y.o. female Date: 07/17/2023 Primary Care Physican: Alm Bustard, NP Primary Cardiologist: Clifton James Electrophysiologist: Joycelyn Schmid Pacing: 99.9%  09/07/2022 Office Weight: 200 lbs   10/20/2022 Weight: 200 lbs 11/24/2022 Weight: 193-195 lbs   Time in AT/AF 0.0 hr/day (0.0%)     Attempted call to patient and unable to reach.  Left detailed message per DPR regarding transmission. Transmission reviewed.    Optivol Thoracic impedance suggesting possible fluid accumulation starting 12/18 but trending back toward baseline   Prescribed:  Furosemide 40 mg take 2 tablets (80 mg total) every morning and 1 tablet (40 mg total) every evening.  Pt self adjust Furosemide when needed.    Labs: 10/03/2022 Creatinine 0.5, BUN 10, Potassium 4.2, Sodium 141, GFR 110 A complete set of results can be found in Results Review.   Recommendations:  Left voice mail with ICM number and encouraged to call if experiencing any fluid symptoms.   Follow-up plan: ICM clinic phone appointment on 08/20/2023.   91 day device clinic remote transmission 10/01/2023.     EP/Cardiology Office Visits:  Next EP visit due 01/2024 with Dr Graciela Husbands (no recall).   Recall 09/02/2023 with Dr Clifton James.   Copy of ICM check sent to Dr. Graciela Husbands.   3 month ICM trend: 07/16/2023.    12-14 Month ICM trend:     Karie Soda, RN 07/17/2023 11:24 AM

## 2023-08-09 NOTE — Addendum Note (Signed)
Addended by: Geralyn Flash D on: 08/09/2023 01:49 PM   Modules accepted: Orders

## 2023-08-09 NOTE — Progress Notes (Signed)
Remote ICD transmission.   

## 2023-08-13 ENCOUNTER — Other Ambulatory Visit: Payer: Self-pay | Admitting: Cardiovascular Disease

## 2023-08-14 MED ORDER — ENTRESTO 97-103 MG PO TABS: 1.0000 | ORAL_TABLET | Freq: Two times a day (BID) | ORAL | 0 refills | Status: DC

## 2023-08-20 ENCOUNTER — Ambulatory Visit: Payer: 59 | Attending: Internal Medicine

## 2023-08-20 DIAGNOSIS — Z95810 Presence of automatic (implantable) cardiac defibrillator: Secondary | ICD-10-CM | POA: Diagnosis not present

## 2023-08-20 DIAGNOSIS — I5022 Chronic systolic (congestive) heart failure: Secondary | ICD-10-CM | POA: Diagnosis not present

## 2023-08-24 NOTE — Progress Notes (Signed)
EPIC Encounter for ICM Monitoring  Patient Name: Susan Peck is a 58 y.o. female Date: 08/24/2023 Primary Care Physican: Alm Bustard, NP Primary Cardiologist: Clifton James Electrophysiologist: Joycelyn Schmid Pacing: 99.8%  09/07/2022 Office Weight: 200 lbs   10/20/2022 Weight: 200 lbs 11/24/2022 Weight: 193-195 lbs   Time in AT/AF  <0.1 hr/day (<0.1%)     Spoke with patient and heart failure questions reviewed.  Transmission results reviewed.  Pt asymptomatic for fluid accumulation.  Reports feeling well at this time and voices no complaints.     Optivol Thoracic impedance suggesting normal fluid levels within the last month.   Prescribed:  Furosemide 40 mg take 2 tablets (80 mg total) every morning and 1 tablet (40 mg total) every evening.  Pt self adjust Furosemide when needed.    Labs: 10/03/2022 Creatinine 0.5, BUN 10, Potassium 4.2, Sodium 141, GFR 110 A complete set of results can be found in Results Review.   Recommendations: No changes and encouraged to call if experiencing any fluid symptoms.   Follow-up plan: ICM clinic phone appointment on 09/24/2023.   91 day device clinic remote transmission 10/01/2023.     EP/Cardiology Office Visits:  Next EP visit due 01/2024 with Dr Graciela Husbands (no recall).  Recall 09/02/2023 with Dr Clifton James.   Copy of ICM check sent to Dr. Graciela Husbands.   3 month ICM trend: 08/20/2023.    12-14 Month ICM trend:     Karie Soda, RN 08/24/2023 2:21 PM

## 2023-09-24 ENCOUNTER — Ambulatory Visit: Payer: 59 | Attending: Internal Medicine

## 2023-09-24 DIAGNOSIS — I5022 Chronic systolic (congestive) heart failure: Secondary | ICD-10-CM | POA: Diagnosis not present

## 2023-09-24 DIAGNOSIS — Z95810 Presence of automatic (implantable) cardiac defibrillator: Secondary | ICD-10-CM | POA: Diagnosis not present

## 2023-09-28 NOTE — Progress Notes (Signed)
 EPIC Encounter for ICM Monitoring  Patient Name: Susan Peck is a 58 y.o. female Date: 09/28/2023 Primary Care Physican: Alm Bustard, NP Primary Cardiologist: Clifton James Electrophysiologist: Joycelyn Schmid Pacing: 99.9%  09/07/2022 Office Weight: 200 lbs   10/20/2022 Weight: 200 lbs 11/24/2022 Weight: 193-195 lbs   Time in AT/AF  <0.1 hr/day (<0.1%)     Spoke with patient and heart failure questions reviewed.  Transmission results reviewed.  Pt asymptomatic for fluid accumulation.  Reports not feeling well from back pain.       Optivol Thoracic impedance suggesting normal fluid levels since 2/3.   Prescribed:  Furosemide 40 mg take 2 tablets (80 mg total) every morning and 1 tablet (40 mg total) every evening.  Pt self adjust Furosemide when needed.    Labs: 10/03/2022 Creatinine 0.5, BUN 10, Potassium 4.2, Sodium 141, GFR 110 A complete set of results can be found in Results Review.   Recommendations: No changes and encouraged to call if experiencing any fluid symptoms.   Follow-up plan: ICM clinic phone appointment on 10/29/2023.   91 day device clinic remote transmission 12/31/2023.     EP/Cardiology Office Visits:  Next EP visit due 01/2024 with Dr Graciela Husbands (no recall).  12/13/2023 with Dr Clifton James.   Copy of ICM check sent to Dr. Graciela Husbands.   3 month ICM trend: 09/24/2023.    12-14 Month ICM trend:     Karie Soda, RN 09/28/2023 2:43 PM

## 2023-10-01 ENCOUNTER — Ambulatory Visit (INDEPENDENT_AMBULATORY_CARE_PROVIDER_SITE_OTHER): Payer: Medicare Other

## 2023-10-01 DIAGNOSIS — I5022 Chronic systolic (congestive) heart failure: Secondary | ICD-10-CM | POA: Diagnosis not present

## 2023-10-01 DIAGNOSIS — I428 Other cardiomyopathies: Secondary | ICD-10-CM

## 2023-10-03 LAB — CUP PACEART REMOTE DEVICE CHECK
Battery Remaining Longevity: 12 mo
Battery Voltage: 2.87 V
Brady Statistic AP VP Percent: 0.55 %
Brady Statistic AP VS Percent: 0.01 %
Brady Statistic AS VP Percent: 99.4 %
Brady Statistic AS VS Percent: 0.04 %
Brady Statistic RA Percent Paced: 0.56 %
Brady Statistic RV Percent Paced: 99.87 %
Date Time Interrogation Session: 20250310052605
HighPow Impedance: 76 Ohm
Implantable Lead Connection Status: 753985
Implantable Lead Connection Status: 753985
Implantable Lead Connection Status: 753985
Implantable Lead Implant Date: 20180524
Implantable Lead Implant Date: 20180524
Implantable Lead Implant Date: 20180524
Implantable Lead Location: 753858
Implantable Lead Location: 753859
Implantable Lead Location: 753860
Implantable Lead Model: 4398
Implantable Lead Model: 5076
Implantable Pulse Generator Implant Date: 20180524
Lead Channel Impedance Value: 193.707
Lead Channel Impedance Value: 201.488
Lead Channel Impedance Value: 209 Ohm
Lead Channel Impedance Value: 218.087
Lead Channel Impedance Value: 218.087
Lead Channel Impedance Value: 361 Ohm
Lead Channel Impedance Value: 418 Ohm
Lead Channel Impedance Value: 418 Ohm
Lead Channel Impedance Value: 418 Ohm
Lead Channel Impedance Value: 456 Ohm
Lead Channel Impedance Value: 475 Ohm
Lead Channel Impedance Value: 475 Ohm
Lead Channel Impedance Value: 570 Ohm
Lead Channel Impedance Value: 627 Ohm
Lead Channel Impedance Value: 627 Ohm
Lead Channel Impedance Value: 684 Ohm
Lead Channel Impedance Value: 722 Ohm
Lead Channel Impedance Value: 741 Ohm
Lead Channel Pacing Threshold Amplitude: 0.375 V
Lead Channel Pacing Threshold Amplitude: 0.5 V
Lead Channel Pacing Threshold Amplitude: 0.5 V
Lead Channel Pacing Threshold Pulse Width: 0.4 ms
Lead Channel Pacing Threshold Pulse Width: 0.4 ms
Lead Channel Pacing Threshold Pulse Width: 0.4 ms
Lead Channel Sensing Intrinsic Amplitude: 22.125 mV
Lead Channel Sensing Intrinsic Amplitude: 22.125 mV
Lead Channel Sensing Intrinsic Amplitude: 3.125 mV
Lead Channel Sensing Intrinsic Amplitude: 3.125 mV
Lead Channel Setting Pacing Amplitude: 1 V
Lead Channel Setting Pacing Amplitude: 1.5 V
Lead Channel Setting Pacing Amplitude: 2.5 V
Lead Channel Setting Pacing Pulse Width: 0.4 ms
Lead Channel Setting Pacing Pulse Width: 0.4 ms
Lead Channel Setting Sensing Sensitivity: 0.3 mV
Zone Setting Status: 755011
Zone Setting Status: 755011

## 2023-10-12 ENCOUNTER — Ambulatory Visit: Attending: Cardiovascular Disease | Admitting: Cardiovascular Disease

## 2023-10-12 ENCOUNTER — Encounter: Payer: Self-pay | Admitting: Cardiovascular Disease

## 2023-10-12 VITALS — BP 138/88 | HR 80 | Ht 64.0 in | Wt 198.0 lb

## 2023-10-12 DIAGNOSIS — I428 Other cardiomyopathies: Secondary | ICD-10-CM

## 2023-10-12 DIAGNOSIS — I10 Essential (primary) hypertension: Secondary | ICD-10-CM | POA: Diagnosis not present

## 2023-10-12 DIAGNOSIS — I5022 Chronic systolic (congestive) heart failure: Secondary | ICD-10-CM

## 2023-10-12 DIAGNOSIS — E7800 Pure hypercholesterolemia, unspecified: Secondary | ICD-10-CM

## 2023-10-12 NOTE — Progress Notes (Signed)
 Chief Complaint  Patient presents with   Follow-up    Non-ischemic cardiomyopathy    History of Present Illness: 58 yo female with history of DM, anxiety, depression, GERD, NICM with ICD in place, HTN, sleep apnea here today for follow up. She has been followed in the Advanced Heart Failure clinic and in the EP clinic (Dr. Graciela Husbands). She has graduated from the Advanced Heart Failure clinic. She has a non-ischemic cardiomyopathy dating back to 2012. BIV ICD implanted in 2018. Last echo in June 2021 with LVEF=55-60%. No valve disease.  Cardiac cath in 2017 with no evidence of CAD.   She is here today for follow up. The patient denies any chest pain, dyspnea, palpitations, lower extremity edema, orthopnea, PND, dizziness, near syncope or syncope. She has back pain and pain in both legs.   Primary Care Physician: Alm Bustard, NP   Past Medical History:  Diagnosis Date   AICD (automatic cardioverter/defibrillator) present 12/13/2016   Anxiety    no meds   Asthma    CHF (congestive heart failure) (HCC)    Depression    no meds   Diabetes mellitus    recent dx 11/17/11 - started med 11/18/11 type 2   GERD (gastroesophageal reflux disease)    Goiter 07/27/2011   non-neoplastic goiter - fine needle aspiration - benign sees dr Lucianne Muss for   Heart murmur    dx 2 yrs ago per pt   Herpes genitalis in women    Hypertension    IBS (irritable bowel syndrome)    tx with diet per pt   Low back pain    history   Obesity    Seasonal allergies    Shortness of breath    occasional - exercise induced   Sleep apnea    cpap broken   Systolic heart failure    May 2011 EF 35-40%, 04/03/11 EF 25-30%, 06/27/11 EF 30-35%    Past Surgical History:  Procedure Laterality Date   ABDOMINAL HYSTERECTOMY     BIV ICD INSERTION CRT-D N/A 12/13/2016   Procedure: BiV ICD Insertion CRT-D;  Surgeon: Duke Salvia, MD;  Location: [facility] INVASIVE CV LAB;  Service: Cardiovascular;  Laterality: N/A;   cardiac  catherization  2004   [place], [place] - Dr Campbell Lerner   CARDIAC CATHETERIZATION     CARDIAC CATHETERIZATION N/A 09/15/2015   Procedure: Right/Left Heart Cath and Coronary Angiography;  Surgeon: Dolores Patty, MD;  Location: [facility] INVASIVE CV LAB;  Service: Cardiovascular;  Laterality: N/A;   COLONOSCOPY WITH PROPOFOL N/A 03/26/2015   Procedure: COLONOSCOPY WITH PROPOFOL;  Surgeon: Jeani Hawking, MD;  Location: [facility] ENDOSCOPY;  Service: Endoscopy;  Laterality: N/A;   colonscopy     CYSTOSCOPY  11/23/2011   Procedure: CYSTOSCOPY;  Surgeon: Delbert Harness, MD;  Location: [facility] ORS;  Service: Gynecology;  Laterality: N/A;   DIAGNOSTIC LAPAROSCOPY     of pelvis   DILATION AND CURETTAGE OF UTERUS  12/2006,  10/2005   hysteroscopy surgery x 2   ESOPHAGOGASTRODUODENOSCOPY (EGD) WITH PROPOFOL N/A 11/12/2020   Procedure: ESOPHAGOGASTRODUODENOSCOPY (EGD) WITH PROPOFOL;  Surgeon: Jeani Hawking, MD;  Location: [facility] ENDOS[facility];  Service: Endoscopy;  Laterality: N/A;   INSERTION OF ICD  12/13/2016   BIV   LAPAROSCOPIC ASSISTED VAGINAL HYSTERECTOMY  11/23/2011   Procedure: LAPAROSCOPIC ASSISTED VAGINAL HYSTERECTOMY;  Surgeon: Delbert Harness, MD;  Location: [facility];  Service: Gynecology;  Laterality: N/A;   NOVASURE ABLATION     10/2005   SALPINGOOPHORECTOMY  11/23/2011   Procedure: SALPINGO OOPHERECTOMY;  Surgeon: Delbert Harness, MD;  Location: [facility];  Service: Gynecology;  Laterality: Bilateral;   SAVORY DILATION N/A 11/12/2020   Procedure: SAVORY DILATION;  Surgeon: Jeani Hawking, MD;  Location: [facility];  Service: Endoscopy;  Laterality: N/A;   SVD     x 3   TOOTH EXTRACTION N/A 04/05/2017   Procedure: DENTAL EXTRACTIONS OF TEETH FIVE, SEVEN, SIXTEEN, SEVENTEEN;  Surgeon: Ocie Doyne, DDS;  Location: [facility];  Service: Oral Surgery;  Laterality: N/A;   TUBAL LIGATION     WISDOM TOOTH EXTRACTION      Current Outpatient Medications  Medication Sig Dispense Refill   aspirin 81 MG chewable tablet Chew 81 mg by mouth  daily.      aspirin-sod bicarb-citric acid (ALKA-SELTZER) 325 MG TBEF tablet Take 650 mg by mouth every 6 (six) hours as needed (indigestion).     atorvastatin (LIPITOR) 40 MG tablet TAKE 1 TABLET BY MOUTH EVERY DAY 90 tablet 3   BD PEN NEEDLE NANO 2ND GEN 32G X 4 MM MISC USE TO INJECT XULTOPHY DAILY 100 each 5   carvedilol (COREG) 25 MG tablet TAKE 1 TABLET BY MOUTH TWICE  DAILY WITH MEALS 200 tablet 2   clobetasol ointment (TEMOVATE) 0.05 % Apply 1 Application topically 2 (two) times daily. As needed     eplerenone (INSPRA) 25 MG tablet Take 25 mg by mouth daily.     fluticasone (FLONASE) 50 MCG/ACT nasal spray PLACE 1 SPRAY INTO BOTH NOSTRILS 2 (TWO) TIMES DAILY 48 mL 1   furosemide (LASIX) 40 MG tablet TAKE 2 TABS EVERY MORNING AND 1 TAB EVERY EVENING. 270 tablet 0   glucose blood (ONETOUCH VERIO) test strip Insulin dependent. Check blood sugar up to 3 times daily. diag code E11.65 100 each 12   Lancet Devices (ONETOUCH DELICA PLUS LANCING) MISC USE AS DIRECTED TO CHECK BLOOD SUGAR 1 each 2   LINZESS 72 MCG capsule Take 72 mcg by mouth daily.     montelukast (SINGULAIR) 10 MG tablet Take 10 mg by mouth daily.     mupirocin cream (BACTROBAN) 2 % APPLY TO AFFECTED AREA TWICE A DAY 15 g 1   ONETOUCH DELICA LANCETS FINE MISC Insulin dependent. Check blood sugar up to 3 times daily. diag code E11.65 100 each 12   PATADAY 0.2 % SOLN Place 1 drop into both eyes daily. 2.5 mL 4   sacubitril-valsartan (ENTRESTO) 97-103 MG Take 1 tablet by mouth 2 (two) times daily. 180 tablet 0   TRESIBA FLEXTOUCH 100 UNIT/ML FlexTouch Pen Inject 18 Units into the skin every morning.     TRULICITY 1.5 MG/0.5ML SOPN Inject into the skin.     valACYclovir (VALTREX) 500 MG tablet Take 1 tablet by mouth twice a day for 3 days. Then take 1 tablet by mouth daily. 90 tablet 3   VYZULTA 0.024 % SOLN Place 1 drop into both eyes at bedtime. 5 mL 1   cyclobenzaprine (FLEXERIL) 10 MG tablet Take 1 tablet (10 mg total) by mouth 2  (two) times daily as needed for muscle spasms. (Patient not taking: Reported on 10/12/2023) 20 tablet 0   predniSONE (DELTASONE) 20 MG tablet 3 tabs po day one, then 2 tabs daily x 4 days 11 tablet 0   No current facility-administered medications for this visit.    Allergies  Allergen Reactions   Shellfish-Derived Products Anaphylaxis    Social History   Socioeconomic History   Marital status: Divorced  Spouse name: Not on file   Number of children: Y   Years of education: Not on file   Highest education level: Not on file  Occupational History   Occupation: Lobbyist: [employer]    Comment: and Engineer, petroleum  Tobacco Use   Smoking status: Never   Smokeless tobacco: Never  Vaping Use   Vaping status: Never Used  Substance and Sexual Activity   Alcohol use: Yes    Comment: Rare   Drug use: No   Sexual activity: Yes    Birth control/protection: Surgical    Comment: , intercourse age 14, more than 5  sexual partners  Other Topics Concern   Not on file  Social History Narrative   She lives with her husband and son.  She is works for [employer].   Social Drivers of Corporate investment banker Strain: Low Risk  (12/06/2022)   Received from [facility], [facility], [facility]   Overall Financial Resource Strain (CARDIA)    Difficulty of Paying Living Expenses: Not hard at all  Food Insecurity: No Food Insecurity (12/06/2022)   Received from [facility], [facility], [facility]   Hunger Vital Sign    Worried About Running Out of Food in the Last Year: Never true    Ran Out of Food in the Last Year: Never true  Transportation Needs: No Transportation Needs (12/06/2022)   Received from [facility], [facility], [facility]   PRAPARE - Transportation    Lack of Transportation (Medical): No    Lack of  Transportation (Non-Medical): No  Physical Activity: Not on file  Stress: Not on file  Social Connections: Not on file  Intimate Partner Violence: Not on file    Family History  Problem Relation Age of Onset   Heart disease Maternal Aunt    Breast cancer Mother    Thyroid disease Mother    Hypertension Maternal Grandmother    Diabetes Maternal Grandmother    Breast cancer Maternal Aunt    Heart disease Maternal Aunt    Diabetes Maternal Grandfather    Diabetes Paternal Grandmother    Diabetes Paternal Grandfather    Hypertension Paternal Grandfather     Review of Systems:  As stated in the HPI and otherwise negative.   BP 138/88   Pulse 80   Ht 5\' 4"  (1.626 m)   Wt 89.8 kg   SpO2 98%   BMI 33.99 kg/m   Physical Examination:  General: Well developed, well nourished, NAD  HEENT: OP clear, mucus membranes moist  SKIN: warm, dry. No rashes. Neuro: No focal deficits  Musculoskeletal: Muscle strength 5/5 all ext  Psychiatric: Mood and affect normal  Neck: No JVD, no carotid bruits, no thyromegaly, no lymphadenopathy.  Lungs:Clear bilaterally, no wheezes, rhonci, crackles Cardiovascular: Regular rate and rhythm. No murmurs, gallops or rubs. Abdomen:Soft. Bowel sounds present. Non-tender.  Extremities: No lower extremity edema. Pulses are 2 + in the bilateral DP/PT.  EKG:  EKG is not ordered today. The ekg ordered today demonstrates   Recent Labs: No results found for requested labs within last 365 days.   Lipid Panel    Component Value Date/Time   CHOL 206 (H) 04/15/2019 1545   TRIG 107 04/15/2019 1545   HDL 39 (L) 04/15/2019 1545   CHOLHDL 5.3 (H) 04/15/2019 1545   CHOLHDL 5 11/09/2015 1147  VLDL 37.8 11/09/2015 1147   LDLCALC 148 (H) 04/15/2019 1545     Wt Readings from Last 3 Encounters:  10/12/23 89.8 kg  05/18/23 88.5 kg  02/20/23 88.5 kg    Assessment and Plan:   1. Non-ischemic cardiomyopathy/Chronic systolic CHF: LVEF normal by echo in 2021. Wt  is stable. No volume overload on exam. BIV ICD in place and followed by Dr. Graciela Husbands. Continue Coreg, Eplerenone, Entresto and Lasix. Will check BMET now.   2. HTN: BP is well controlled. She did not take her medications this morning. Continue current therapy.   3. Leg pain: Likely neurological. Her DP and PT pulses are normal so not vascular related.   4. Hyperlipidemia: Followed in primary care. No recent lipid profile or LFTs. Will check lipids and LFTS now.   Labs/ tests ordered today include:   Orders Placed This Encounter  Procedures   Lipid panel   Hepatic function panel   Basic metabolic panel   Disposition:   F/U with me in one year   Signed, Verne Carrow, MD, [facility] 10/12/2023 4:35 PM    [facility] [address] [place], [place], [place]  [address] Phone: [phone]; Fax: [phone]

## 2023-10-12 NOTE — Patient Instructions (Signed)
 Medication Instructions:  No changes *If you need a refill on your cardiac medications before your next appointment, please call your pharmacy*   Lab Work: Today: bmet, lipids, liver   Testing/Procedures: none   Follow-Up: At [facility], you and your health needs are our priority.  As part of our continuing mission to provide you with exceptional heart care, we have created designated Provider Care Teams.  These Care Teams include your primary Cardiologist (physician) and Advanced Practice Providers (APPs -  Physician Assistants and Nurse Practitioners) who all work together to provide you with the care you need, when you need it.   Your next appointment:   12 month(s)  Provider:   Verne Carrow, MD

## 2023-10-29 ENCOUNTER — Ambulatory Visit: Attending: Internal Medicine

## 2023-10-29 DIAGNOSIS — Z95810 Presence of automatic (implantable) cardiac defibrillator: Secondary | ICD-10-CM

## 2023-10-29 DIAGNOSIS — I5022 Chronic systolic (congestive) heart failure: Secondary | ICD-10-CM

## 2023-10-30 ENCOUNTER — Telehealth: Payer: Self-pay

## 2023-10-30 NOTE — Progress Notes (Signed)
 EPIC Encounter for ICM Monitoring  Patient Name: Susan Peck is a 58 y.o. female Date: 10/30/2023 Primary Care Physican: Alm Bustard, NP Primary Cardiologist: Clifton James Electrophysiologist: Joycelyn Schmid Pacing: 99.9%  09/07/2022 Office Weight: 200 lbs   10/20/2022 Weight: 200 lbs 11/24/2022 Weight: 193-195 lbs   Time in AT/AF  0.0 hr/day (0.0%)    Attempted call to patient and unable to reach.  Left detailed message per DPR regarding transmission.  Transmission results reviewed.    Optivol Thoracic impedance suggesting possible fluid accumulation since 3/26 but trending closer to baseline since 4/1.   Prescribed:  Furosemide 40 mg take 2 tablets (80 mg total) every morning and 1 tablet (40 mg total) every evening.  Pt self adjust Furosemide when needed.    Labs: 10/03/2022 Creatinine 0.5, BUN 10, Potassium 4.2, Sodium 141, GFR 110 A complete set of results can be found in Results Review.   Recommendations:  Left voice mail with ICM number and encouraged to call if experiencing any fluid symptoms.   Follow-up plan: ICM clinic phone appointment on 11/05/2023 to recheck fluid levels.   91 day device clinic remote transmission 12/31/2023.     EP/Cardiology Office Visits:  02/08/2024 with Dr Graciela Husbands.  12/13/2023 with Dr Clifton James.   Copy of ICM check sent to Dr. Graciela Husbands.   3 month ICM trend: 10/29/2023.    12-14 Month ICM trend:     Karie Soda, RN 10/30/2023 5:03 PM

## 2023-10-30 NOTE — Telephone Encounter (Signed)
 Remote ICM transmission received.  Attempted call to patient regarding ICM remote transmission and left detailed message per DPR.  Left ICM phone number and advised to return call for any fluid symptoms or questions. Next ICM remote transmission scheduled 11/05/2023.

## 2023-10-31 LAB — BASIC METABOLIC PANEL WITH GFR
BUN/Creatinine Ratio: 19 (ref 9–23)
BUN: 11 mg/dL (ref 6–24)
CO2: 23 mmol/L (ref 20–29)
Calcium: 9.4 mg/dL (ref 8.7–10.2)
Chloride: 105 mmol/L (ref 96–106)
Creatinine, Ser: 0.58 mg/dL (ref 0.57–1.00)
Glucose: 91 mg/dL (ref 70–99)
Potassium: 4.4 mmol/L (ref 3.5–5.2)
Sodium: 144 mmol/L (ref 134–144)
eGFR: 105 mL/min/{1.73_m2} (ref >59)

## 2023-10-31 LAB — LIPID PANEL
Chol/HDL Ratio: 3.7 ratio (ref 0.0–4.4)
Cholesterol, Total: 183 mg/dL (ref 100–199)
HDL: 49 mg/dL (ref >39)
LDL Chol Calc (NIH): 111 mg/dL — ABNORMAL HIGH (ref 0–99)
Triglycerides: 131 mg/dL (ref 0–149)
VLDL Cholesterol Cal: 23 mg/dL (ref 5–40)

## 2023-10-31 LAB — HEPATIC FUNCTION PANEL
ALT: 18 IU/L (ref 0–32)
AST: 19 IU/L (ref 0–40)
Albumin: 4.1 g/dL (ref 3.8–4.9)
Alkaline Phosphatase: 136 IU/L — ABNORMAL HIGH (ref 44–121)
Bilirubin Total: 0.4 mg/dL (ref 0.0–1.2)
Bilirubin, Direct: 0.18 mg/dL (ref 0.00–0.40)
Total Protein: 6.9 g/dL (ref 6.0–8.5)

## 2023-11-05 ENCOUNTER — Ambulatory Visit: Attending: Internal Medicine

## 2023-11-05 ENCOUNTER — Encounter: Payer: Self-pay | Admitting: Internal Medicine

## 2023-11-05 DIAGNOSIS — I5022 Chronic systolic (congestive) heart failure: Secondary | ICD-10-CM

## 2023-11-05 DIAGNOSIS — Z95810 Presence of automatic (implantable) cardiac defibrillator: Secondary | ICD-10-CM

## 2023-11-07 NOTE — Progress Notes (Signed)
 EPIC Encounter for ICM Monitoring  Patient Name: Susan Peck is a 58 y.o. female Date: 11/07/2023 Primary Care Physican: Will Hare, NP Primary Cardiologist: Abel Hoe Electrophysiologist: Victorino Grates Pacing: 99.9%  09/07/2022 Office Weight: 200 lbs   10/20/2022 Weight: 200 lbs 11/24/2022 Weight: 193-195 lbs   Time in AT/AF  0.0 hr/day (0.0%)    Transmission results reviewed.    Optivol Thoracic impedance suggesting possible fluid accumulation since 3/26 but trending close to baseline since 4/1.   Prescribed:  Furosemide 40 mg take 2 tablets (80 mg total) every morning and 1 tablet (40 mg total) every evening.  Pt self adjust Furosemide when needed.    Labs: 10/03/2022 Creatinine 0.5, BUN 10, Potassium 4.2, Sodium 141, GFR 110 A complete set of results can be found in Results Review.   Recommendations: None.   Follow-up plan: ICM clinic phone appointment on 12/03/2023.   91 day device clinic remote transmission 12/31/2023.     EP/Cardiology Office Visits:  02/08/2024 with Dr Rodolfo Clan.  12/13/2023 with Dr Abel Hoe.   Copy of ICM check sent to Dr. Rodolfo Clan.   3 month ICM trend: 11/05/2023.    12-14 Month ICM trend:     Almyra Jain, RN 11/07/2023 8:19 AM

## 2023-11-12 NOTE — Progress Notes (Signed)
 Remote ICD transmission.

## 2023-11-12 NOTE — Addendum Note (Signed)
 Addended by: Edra Govern D on: 11/12/2023 03:21 PM   Modules accepted: Orders

## 2023-11-17 ENCOUNTER — Other Ambulatory Visit: Payer: Self-pay | Admitting: Cardiovascular Disease

## 2023-11-29 ENCOUNTER — Other Ambulatory Visit: Payer: Self-pay | Admitting: Internal Medicine

## 2023-12-03 ENCOUNTER — Encounter

## 2023-12-10 ENCOUNTER — Ambulatory Visit: Attending: Cardiovascular Disease

## 2023-12-10 DIAGNOSIS — Z95810 Presence of automatic (implantable) cardiac defibrillator: Secondary | ICD-10-CM | POA: Diagnosis not present

## 2023-12-10 DIAGNOSIS — I5022 Chronic systolic (congestive) heart failure: Secondary | ICD-10-CM | POA: Diagnosis not present

## 2023-12-12 ENCOUNTER — Telehealth: Payer: Self-pay

## 2023-12-12 NOTE — Progress Notes (Signed)
 EPIC Encounter for ICM Monitoring  Patient Name: Susan Peck is a 57 y.o. female Date: 12/12/2023 Primary Care Physican: Will Hare, NP Primary Cardiologist: Abel Hoe Electrophysiologist: Mealor Bi-V Pacing: 99.9%  09/07/2022 Office Weight: 200 lbs   10/20/2022 Weight: 200 lbs 11/24/2022 Weight: 193-195 lbs   Time in AT/AF  0.0 hr/day (0.0%)    Attempted call to patient and unable to reach.  Left detailed message per DPR regarding transmission.  Transmission results reviewed.    Optivol Thoracic impedance suggesting normal fluid levels within the last month.   Prescribed:  Furosemide  40 mg take 2 tablets (80 mg total) every morning and 1 tablet (40 mg total) every evening.  Pt self adjust Furosemide  when needed.    Labs: 10/30/2023 Creatinine 0.58, BUN 11, Potassium 4.4, Sodium 144, GFR 105 A complete set of results can be found in Results Review.   Recommendations:   Left voice mail with ICM number and encouraged to call if experiencing any fluid symptoms.   Follow-up plan: ICM clinic phone appointment on 02/11/2024.   91 day device clinic remote transmission 12/31/2023.     EP/Cardiology Office Visits:  02/08/2024 with Dr Rodolfo Clan.   Last general cardiology visit was 10/12/2023 with Dr Abel Hoe (f/u 1 year but no recall).   Copy of ICM check sent to Dr. Arlester Ladd.   3 month ICM trend: 12/10/2023.    12-14 Month ICM trend:     Almyra Jain, RN 12/12/2023 9:05 AM

## 2023-12-12 NOTE — Telephone Encounter (Signed)
 Remote ICM transmission received.  Attempted call to patient regarding ICM remote transmission and left detailed message per DPR.  Left ICM phone number and advised to return call for any fluid symptoms or questions. Next ICM remote transmission scheduled 02/11/2024.

## 2023-12-13 ENCOUNTER — Ambulatory Visit: Payer: 59 | Admitting: Cardiovascular Disease

## 2023-12-31 ENCOUNTER — Other Ambulatory Visit: Payer: Self-pay | Admitting: Student

## 2023-12-31 ENCOUNTER — Ambulatory Visit: Payer: Medicare Other | Attending: Internal Medicine

## 2023-12-31 DIAGNOSIS — K469 Unspecified abdominal hernia without obstruction or gangrene: Secondary | ICD-10-CM

## 2023-12-31 DIAGNOSIS — I5022 Chronic systolic (congestive) heart failure: Secondary | ICD-10-CM

## 2023-12-31 DIAGNOSIS — I428 Other cardiomyopathies: Secondary | ICD-10-CM

## 2024-01-01 LAB — CUP PACEART REMOTE DEVICE CHECK
Battery Remaining Longevity: 10 mo
Battery Voltage: 2.86 V
Brady Statistic AP VP Percent: 1.14 %
Brady Statistic AP VS Percent: 0.01 %
Brady Statistic AS VP Percent: 98.8 %
Brady Statistic AS VS Percent: 0.05 %
Brady Statistic RA Percent Paced: 1.15 %
Brady Statistic RV Percent Paced: 99.63 %
Date Time Interrogation Session: 20250609033324
HighPow Impedance: 67 Ohm
Implantable Lead Connection Status: 753985
Implantable Lead Connection Status: 753985
Implantable Lead Connection Status: 753985
Implantable Lead Implant Date: 20180524
Implantable Lead Implant Date: 20180524
Implantable Lead Implant Date: 20180524
Implantable Lead Location: 753858
Implantable Lead Location: 753859
Implantable Lead Location: 753860
Implantable Lead Model: 4398
Implantable Lead Model: 5076
Implantable Pulse Generator Implant Date: 20180524
Lead Channel Impedance Value: 189.525
Lead Channel Impedance Value: 201.488
Lead Channel Impedance Value: 212.8 Ohm
Lead Channel Impedance Value: 212.8 Ohm
Lead Channel Impedance Value: 228 Ohm
Lead Channel Impedance Value: 361 Ohm
Lead Channel Impedance Value: 399 Ohm
Lead Channel Impedance Value: 456 Ohm
Lead Channel Impedance Value: 456 Ohm
Lead Channel Impedance Value: 475 Ohm
Lead Channel Impedance Value: 475 Ohm
Lead Channel Impedance Value: 475 Ohm
Lead Channel Impedance Value: 570 Ohm
Lead Channel Impedance Value: 665 Ohm
Lead Channel Impedance Value: 665 Ohm
Lead Channel Impedance Value: 722 Ohm
Lead Channel Impedance Value: 741 Ohm
Lead Channel Impedance Value: 741 Ohm
Lead Channel Pacing Threshold Amplitude: 0.5 V
Lead Channel Pacing Threshold Amplitude: 0.625 V
Lead Channel Pacing Threshold Amplitude: 0.625 V
Lead Channel Pacing Threshold Pulse Width: 0.4 ms
Lead Channel Pacing Threshold Pulse Width: 0.4 ms
Lead Channel Pacing Threshold Pulse Width: 0.4 ms
Lead Channel Sensing Intrinsic Amplitude: 2.625 mV
Lead Channel Sensing Intrinsic Amplitude: 2.625 mV
Lead Channel Sensing Intrinsic Amplitude: 23.25 mV
Lead Channel Sensing Intrinsic Amplitude: 23.25 mV
Lead Channel Setting Pacing Amplitude: 1 V
Lead Channel Setting Pacing Amplitude: 1.5 V
Lead Channel Setting Pacing Amplitude: 2.5 V
Lead Channel Setting Pacing Pulse Width: 0.4 ms
Lead Channel Setting Pacing Pulse Width: 0.4 ms
Lead Channel Setting Sensing Sensitivity: 0.3 mV
Zone Setting Status: 755011
Zone Setting Status: 755011

## 2024-01-02 ENCOUNTER — Encounter: Payer: Self-pay | Admitting: Cardiovascular Disease

## 2024-01-04 ENCOUNTER — Ambulatory Visit: Payer: Self-pay | Admitting: Cardiovascular Disease

## 2024-01-07 ENCOUNTER — Other Ambulatory Visit: Payer: Self-pay | Admitting: Student

## 2024-01-07 ENCOUNTER — Ambulatory Visit
Admission: RE | Admit: 2024-01-07 | Discharge: 2024-01-07 | Disposition: A | Discharge status: Home / Self Care | Source: Ambulatory Visit | Attending: Student | Admitting: Student

## 2024-01-07 DIAGNOSIS — K469 Unspecified abdominal hernia without obstruction or gangrene: Secondary | ICD-10-CM

## 2024-01-07 DIAGNOSIS — M549 Dorsalgia, unspecified: Secondary | ICD-10-CM

## 2024-01-08 ENCOUNTER — Other Ambulatory Visit: Payer: Self-pay | Admitting: Internal Medicine

## 2024-02-08 ENCOUNTER — Encounter: Admitting: Internal Medicine

## 2024-02-11 ENCOUNTER — Ambulatory Visit: Attending: Cardiovascular Disease

## 2024-02-11 DIAGNOSIS — Z95810 Presence of automatic (implantable) cardiac defibrillator: Secondary | ICD-10-CM

## 2024-02-11 DIAGNOSIS — I5022 Chronic systolic (congestive) heart failure: Secondary | ICD-10-CM

## 2024-02-13 NOTE — Addendum Note (Signed)
 Addended by: TAWNI DRILLING D on: 02/13/2024 04:33 PM   Modules accepted: Orders

## 2024-02-13 NOTE — Progress Notes (Signed)
 Remote ICD transmission.

## 2024-02-14 ENCOUNTER — Telehealth: Payer: Self-pay

## 2024-02-14 NOTE — Progress Notes (Signed)
 EPIC Encounter for ICM Monitoring  Patient Name: Susan Peck is a 58 y.o. female Date: 02/14/2024 Primary Care Physican: Harvey Gaetana CROME, NP Primary Cardiologist: Verlin Electrophysiologist: Mealor Bi-V Pacing: 99.8%  09/07/2022 Office Weight: 200 lbs   10/20/2022 Weight: 200 lbs 11/24/2022 Weight: 193-195 lbs   Time in AT/AF  0.0 hr/day (0.0%)    Attempted call to patient and unable to reach.  Left detailed message per DPR regarding transmission.  Transmission results reviewed.    Optivol Thoracic impedance suggesting normal fluid levels within the last month.   Prescribed:  Furosemide  40 mg take 2 tablets (80 mg total) every morning and 1 tablet (40 mg total) every evening.  Pt self adjust Furosemide  when needed.    Labs: 10/30/2023 Creatinine 0.58, BUN 11, Potassium 4.4, Sodium 144, GFR 105 A complete set of results can be found in Results Review.   Recommendations:   Left voice mail with ICM number and encouraged to call if experiencing any fluid symptoms.   Follow-up plan: ICM clinic phone appointment on 03/17/2024.   91 day device clinic remote transmission 03/31/2024.     EP/Cardiology Office Visits: 02/27/2024 with Dr Nancey.   Last general cardiology visit was 10/12/2023 with Dr Verlin (f/u 1 year but no recall).   Copy of ICM check sent to Dr. Nancey.    3 month ICM trend: 02/11/2024.    12-14 Month ICM trend:     [name], RN 02/14/2024 3:38 PM

## 2024-02-14 NOTE — Telephone Encounter (Signed)
 Remote ICM transmission received.  Attempted call to patient regarding ICM remote transmission and left detailed message per DPR.  Left ICM phone number and advised to return call for any fluid symptoms or questions. Next ICM remote transmission scheduled 03/17/2024.

## 2024-02-26 NOTE — Progress Notes (Unsigned)
  Electrophysiology Office Note:    Date:  02/27/2024   ID:  Susan Peck, DOB 02/09/1966, MRN [id number]  PCP:  Harvey Gaetana CROME, NP   [facility] Providers Cardiologist:  Lonni Cash, MD Electrophysiologist:  Elspeth Sage, MD     Referring MD: Harvey Gaetana CROME, NP   History of Present Illness:    Susan Peck is a 58 y.o. female with a medical history significant for Medtronic BiV ICD, CHFrEF, nonischemic cardiomyopathy, sleep apnea who presents for device follow-up.        History of Present Illness  She has been followed by Drs. Sage and Roper in the advanced heart failure clinic.  She has a Medtronic BiV ICD implant implanted in 2018.  She has frequent discomfort from the device.  She has felt stinging and shocking sensations although her device indicates that she has never actually had a defibrillator discharge.  She feels the device is mobile.        Today, she is at baseline.  No acute complaints  EKGs/Labs/Other Studies Reviewed Today:     Echocardiogram:  TTE June 2021 LVEF 55 to 60%.  Mild concentric LVH.  Grade 1 diastolic dysfunction.   Cardiac catherization  Neri angiogram 2017 No evidence of coronary disease.  EKG:         Physical Exam:    VS:  BP 110/70 (BP Location: Left Arm, Patient Position: Sitting, Cuff Size: Large)   Pulse 76   Ht 5' 4 (1.626 m)   Wt 196 lb (88.9 kg)   SpO2 95%   BMI 33.64 kg/m     Wt Readings from Last 3 Encounters:  02/27/24 196 lb (88.9 kg)  10/12/23 198 lb (89.8 kg)  05/18/23 195 lb (88.5 kg)     GEN: Well nourished, well developed in no acute distress CARDIAC: RRR, no murmurs, rubs, gallops The device site is normal -- no tenderness, edema, drainage, redness, threatened erosion.  RESPIRATORY:  Normal work of breathing MUSCULOSKELETAL: no edema    ASSESSMENT & PLAN:     Medtronic BiV ICD I reviewed today's device interrogation.  See Paceart for details She is 99% BiV  paced She is not device dependent today Less than a year battery remaining  Nonischemic cardiomyopathy, CHFrEF Last TTE was 2021 She does not exhibit evidence of CHF today Continue Entresto  97-103, Lasix  40 mg, carvedilol  25 mg twice daily    Signed, Heidy Mccubbin E Zivah Mayr, MD  02/27/2024 9:40 AM    [facility]

## 2024-02-27 ENCOUNTER — Encounter: Payer: Self-pay | Admitting: Cardiovascular Disease

## 2024-02-27 ENCOUNTER — Ambulatory Visit: Attending: Cardiology | Admitting: Cardiovascular Disease

## 2024-02-27 VITALS — BP 110/70 | HR 76 | Ht 64.0 in | Wt 196.0 lb

## 2024-02-27 DIAGNOSIS — Z95810 Presence of automatic (implantable) cardiac defibrillator: Secondary | ICD-10-CM

## 2024-02-27 DIAGNOSIS — I5022 Chronic systolic (congestive) heart failure: Secondary | ICD-10-CM | POA: Diagnosis not present

## 2024-02-27 DIAGNOSIS — I428 Other cardiomyopathies: Secondary | ICD-10-CM

## 2024-02-27 LAB — CUP PACEART INCLINIC DEVICE CHECK
Date Time Interrogation Session: 20250806105950
Implantable Lead Connection Status: 753985
Implantable Lead Connection Status: 753985
Implantable Lead Connection Status: 753985
Implantable Lead Implant Date: 20180524
Implantable Lead Implant Date: 20180524
Implantable Lead Implant Date: 20180524
Implantable Lead Location: 753858
Implantable Lead Location: 753859
Implantable Lead Location: 753860
Implantable Lead Model: 4398
Implantable Lead Model: 5076
Implantable Pulse Generator Implant Date: 20180524

## 2024-02-27 NOTE — Patient Instructions (Signed)

## 2024-02-29 ENCOUNTER — Other Ambulatory Visit: Payer: Self-pay | Admitting: Student

## 2024-02-29 DIAGNOSIS — Z1231 Encounter for screening mammogram for malignant neoplasm of breast: Secondary | ICD-10-CM

## 2024-03-04 ENCOUNTER — Ambulatory Visit: Payer: Self-pay | Admitting: Cardiovascular Disease

## 2024-03-17 ENCOUNTER — Encounter

## 2024-03-19 NOTE — Progress Notes (Signed)
 No ICM remote transmission received for 03/17/2024 and next ICM transmission scheduled for 04/07/2024.

## 2024-03-31 ENCOUNTER — Ambulatory Visit (INDEPENDENT_AMBULATORY_CARE_PROVIDER_SITE_OTHER): Payer: Medicare Other

## 2024-03-31 DIAGNOSIS — I5022 Chronic systolic (congestive) heart failure: Secondary | ICD-10-CM | POA: Diagnosis not present

## 2024-04-02 ENCOUNTER — Telehealth: Payer: Self-pay

## 2024-04-02 NOTE — Telephone Encounter (Signed)
 Pt called in stating that she needs to have her monitor replaced. Medtr[employer] is sending it and should reach the pt in 7-10 business days

## 2024-04-07 ENCOUNTER — Ambulatory Visit: Attending: Cardiovascular Disease

## 2024-04-07 DIAGNOSIS — I5022 Chronic systolic (congestive) heart failure: Secondary | ICD-10-CM

## 2024-04-07 DIAGNOSIS — Z95810 Presence of automatic (implantable) cardiac defibrillator: Secondary | ICD-10-CM | POA: Diagnosis not present

## 2024-04-09 ENCOUNTER — Telehealth: Payer: Self-pay

## 2024-04-09 DIAGNOSIS — Z95810 Presence of automatic (implantable) cardiac defibrillator: Secondary | ICD-10-CM

## 2024-04-09 DIAGNOSIS — I5022 Chronic systolic (congestive) heart failure: Secondary | ICD-10-CM

## 2024-04-09 LAB — CUP PACEART REMOTE DEVICE CHECK
Battery Remaining Longevity: 4 mo
Battery Voltage: 2.83 V
Brady Statistic AP VP Percent: 0.23 %
Brady Statistic AP VS Percent: 0.01 %
Brady Statistic AS VP Percent: 99.67 %
Brady Statistic AS VS Percent: 0.09 %
Brady Statistic RA Percent Paced: 0.25 %
Brady Statistic RV Percent Paced: 99.88 %
Date Time Interrogation Session: 20250913185629
HighPow Impedance: 75 Ohm
Implantable Lead Connection Status: 753985
Implantable Lead Connection Status: 753985
Implantable Lead Connection Status: 753985
Implantable Lead Implant Date: 20180524
Implantable Lead Implant Date: 20180524
Implantable Lead Implant Date: 20180524
Implantable Lead Location: 753858
Implantable Lead Location: 753859
Implantable Lead Location: 753860
Implantable Lead Model: 4398
Implantable Lead Model: 5076
Implantable Pulse Generator Implant Date: 20180524
Lead Channel Impedance Value: 204.14 Ohm
Lead Channel Impedance Value: 216.848
Lead Channel Impedance Value: 218.087
Lead Channel Impedance Value: 222.34 Ohm
Lead Channel Impedance Value: 232.653
Lead Channel Impedance Value: 399 Ohm
Lead Channel Impedance Value: 418 Ohm
Lead Channel Impedance Value: 456 Ohm
Lead Channel Impedance Value: 475 Ohm
Lead Channel Impedance Value: 475 Ohm
Lead Channel Impedance Value: 475 Ohm
Lead Channel Impedance Value: 570 Ohm
Lead Channel Impedance Value: 627 Ohm
Lead Channel Impedance Value: 722 Ohm
Lead Channel Impedance Value: 722 Ohm
Lead Channel Impedance Value: 779 Ohm
Lead Channel Impedance Value: 779 Ohm
Lead Channel Impedance Value: 817 Ohm
Lead Channel Pacing Threshold Amplitude: 0.5 V
Lead Channel Pacing Threshold Amplitude: 0.5 V
Lead Channel Pacing Threshold Amplitude: 0.75 V
Lead Channel Pacing Threshold Pulse Width: 0.4 ms
Lead Channel Pacing Threshold Pulse Width: 0.4 ms
Lead Channel Pacing Threshold Pulse Width: 0.4 ms
Lead Channel Sensing Intrinsic Amplitude: 19.25 mV
Lead Channel Sensing Intrinsic Amplitude: 19.25 mV
Lead Channel Sensing Intrinsic Amplitude: 2.25 mV
Lead Channel Sensing Intrinsic Amplitude: 2.25 mV
Lead Channel Setting Pacing Amplitude: 1 V
Lead Channel Setting Pacing Amplitude: 1.5 V
Lead Channel Setting Pacing Amplitude: 2.5 V
Lead Channel Setting Pacing Pulse Width: 0.4 ms
Lead Channel Setting Pacing Pulse Width: 0.4 ms
Lead Channel Setting Sensing Sensitivity: 0.3 mV
Zone Setting Status: 755011
Zone Setting Status: 755011

## 2024-04-09 NOTE — Telephone Encounter (Signed)
 Remote ICM transmission received.  Attempted call to patient regarding ICM remote transmission.  Left detailed message per DPR with ICM phone number to return call for any questions, concerns or fluid symptoms.

## 2024-04-09 NOTE — Progress Notes (Signed)
 EPIC Encounter for ICM Monitoring  Patient Name: Susan Peck is a 58 y.o. female Date: 04/09/2024 Primary Care Physican: Harvey Gaetana CROME, NP Primary Cardiologist: Verlin Electrophysiologist: Mealor Bi-V Pacing: 100%  09/07/2022 Office Weight: 200 lbs   10/20/2022 Weight: 200 lbs 11/24/2022 Weight: 193-195 lbs 10/12/2023 Office Weight: 198 lbs 02/27/2024 Office Weight: 196 lbs   Time in AT/AF  0.0 hr/day (0.0%)   Battery ERI: 4 months:   Attempted call to patient and unable to reach.  Left detailed message per DPR regarding transmission.  Transmission results reviewed.    Optivol Thoracic impedance suggesting normal fluid levels since 8/20.   Prescribed:  Furosemide  40 mg take 2 tablets (80 mg total) every morning and 1 tablet (40 mg total) every evening.  Pt self adjust Furosemide  when needed.    Labs: 10/30/2023 Creatinine 0.58, BUN 11, Potassium 4.4, Sodium 144, GFR 105 A complete set of results can be found in Results Review.   Recommendations:   Left voice mail with ICM number and encouraged to call if experiencing any fluid symptoms.   Follow-up plan: ICM clinic phone appointment on 05/19/2024.   91 day device clinic remote transmission 07/14/2024.     EP/Cardiology Office Visits:  Recall 02/27/2025 with Dr Nancey.   Recall 10/11/2024 with Dr Verlin.   Copy of ICM check sent to Dr. Nancey.   3 month ICM trend: 04/07/2024.    12-14 Month ICM trend:     Susan GORMAN Garner, RN 04/09/2024 2:44 PM

## 2024-04-10 ENCOUNTER — Ambulatory Visit: Payer: Self-pay | Admitting: Cardiovascular Disease

## 2024-04-10 NOTE — Progress Notes (Signed)
Remote ICD Transmission.

## 2024-05-01 ENCOUNTER — Ambulatory Visit

## 2024-05-01 ENCOUNTER — Encounter

## 2024-05-02 LAB — CUP PACEART REMOTE DEVICE CHECK
Battery Remaining Longevity: 6 mo
Battery Voltage: 2.83 V
Brady Statistic AP VP Percent: 0.07 %
Brady Statistic AP VS Percent: 0.01 %
Brady Statistic AS VP Percent: 99.87 %
Brady Statistic AS VS Percent: 0.05 %
Brady Statistic RA Percent Paced: 0.08 %
Brady Statistic RV Percent Paced: 99.93 %
Date Time Interrogation Session: 20251010093727
HighPow Impedance: 66 Ohm
Implantable Lead Connection Status: 753985
Implantable Lead Connection Status: 753985
Implantable Lead Connection Status: 753985
Implantable Lead Implant Date: 20180524
Implantable Lead Implant Date: 20180524
Implantable Lead Implant Date: 20180524
Implantable Lead Location: 753858
Implantable Lead Location: 753859
Implantable Lead Location: 753860
Implantable Lead Model: 4398
Implantable Lead Model: 5076
Implantable Pulse Generator Implant Date: 20180524
Lead Channel Impedance Value: 170.472
Lead Channel Impedance Value: 182.205
Lead Channel Impedance Value: 193.707
Lead Channel Impedance Value: 193.707
Lead Channel Impedance Value: 209 Ohm
Lead Channel Impedance Value: 323 Ohm
Lead Channel Impedance Value: 361 Ohm
Lead Channel Impedance Value: 418 Ohm
Lead Channel Impedance Value: 418 Ohm
Lead Channel Impedance Value: 456 Ohm
Lead Channel Impedance Value: 475 Ohm
Lead Channel Impedance Value: 475 Ohm
Lead Channel Impedance Value: 570 Ohm
Lead Channel Impedance Value: 627 Ohm
Lead Channel Impedance Value: 665 Ohm
Lead Channel Impedance Value: 684 Ohm
Lead Channel Impedance Value: 684 Ohm
Lead Channel Impedance Value: 722 Ohm
Lead Channel Pacing Threshold Amplitude: 0.5 V
Lead Channel Pacing Threshold Amplitude: 0.5 V
Lead Channel Pacing Threshold Amplitude: 1 V
Lead Channel Pacing Threshold Pulse Width: 0.4 ms
Lead Channel Pacing Threshold Pulse Width: 0.4 ms
Lead Channel Pacing Threshold Pulse Width: 0.4 ms
Lead Channel Sensing Intrinsic Amplitude: 20.625 mV
Lead Channel Sensing Intrinsic Amplitude: 20.625 mV
Lead Channel Sensing Intrinsic Amplitude: 3.875 mV
Lead Channel Sensing Intrinsic Amplitude: 3.875 mV
Lead Channel Setting Pacing Amplitude: 1 V
Lead Channel Setting Pacing Amplitude: 1.5 V
Lead Channel Setting Pacing Amplitude: 2.5 V
Lead Channel Setting Pacing Pulse Width: 0.4 ms
Lead Channel Setting Pacing Pulse Width: 0.4 ms
Lead Channel Setting Sensing Sensitivity: 0.3 mV
Zone Setting Status: 755011
Zone Setting Status: 755011

## 2024-05-12 ENCOUNTER — Encounter

## 2024-05-19 ENCOUNTER — Ambulatory Visit: Attending: Cardiovascular Disease

## 2024-05-19 DIAGNOSIS — I5022 Chronic systolic (congestive) heart failure: Secondary | ICD-10-CM

## 2024-05-19 DIAGNOSIS — Z95810 Presence of automatic (implantable) cardiac defibrillator: Secondary | ICD-10-CM

## 2024-05-22 ENCOUNTER — Telehealth: Payer: Self-pay

## 2024-05-22 NOTE — Progress Notes (Signed)
 EPIC Encounter for ICM Monitoring  Patient Name: Susan Peck is a 58 y.o. female Date: 05/22/2024 Primary Care Physican: Harvey Gaetana CROME, NP Primary Cardiologist: Verlin Electrophysiologist: Mealor Bi-V Pacing: 99.9%  09/07/2022 Office Weight: 200 lbs   10/20/2022 Weight: 200 lbs 11/24/2022 Weight: 193-195 lbs 10/12/2023 Office Weight: 198 lbs 02/27/2024 Office Weight: 196 lbs   Since 02-May-2024 Time in AT/AF  <0.1 hr/day (<0.1%)   Battery ERI: 6 months:   Attempted call to patient and unable to reach.  Left detailed message per DPR regarding transmission.  Transmission results reviewed.    Since 04/07/2024 ICM Remote Transmission: Optivol Thoracic impedance suggesting intermittent days with possible fluid accumulation.   Prescribed:  Furosemide  40 mg take 2 tablets (80 mg total) every morning and 1 tablet (40 mg total) every evening.  Pt self adjust Furosemide  when needed.    Labs: 10/30/2023 Creatinine 0.58, BUN 11, Potassium 4.4, Sodium 144, GFR 105 A complete set of results can be found in Results Review.   Recommendations: Left voice mail with ICM number and encouraged to call if experiencing any fluid symptoms.   Follow-up plan: ICM clinic phone appointment on 06/23/2024.   91 day device clinic remote transmission 07/14/2024.     EP/Cardiology Office Visits:  Recall 02/27/2025 with Dr Nancey.   Recall 10/11/2024 with Dr Verlin.   Copy of ICM check sent to Dr. Nancey.   Remote monitoring is medically necessary for Heart Failure Management.    Daily Thoracic Impedance ICM trend: 02/18/2024 through 05/19/2024.    12-14 Month Thoracic Impedance ICM trend:     [name], RN 05/22/2024 8:33 AM

## 2024-05-22 NOTE — Telephone Encounter (Signed)
 Remote ICM transmission received.  Attempted call to patient regarding ICM remote transmission.  Left detailed message per DPR with ICM phone number to return call for any questions, concerns or fluid symptoms.

## 2024-06-01 ENCOUNTER — Encounter

## 2024-06-01 ENCOUNTER — Encounter: Payer: Self-pay | Admitting: Cardiovascular Disease

## 2024-06-01 LAB — CUP PACEART REMOTE DEVICE CHECK
Battery Remaining Longevity: 6 mo
Battery Voltage: 2.82 V
Brady Statistic AP VP Percent: 0.27 %
Brady Statistic AP VS Percent: 0.01 %
Brady Statistic AS VP Percent: 99.67 %
Brady Statistic AS VS Percent: 0.05 %
Brady Statistic RA Percent Paced: 0.28 %
Brady Statistic RV Percent Paced: 99.92 %
Date Time Interrogation Session: 20251109012503
HighPow Impedance: 69 Ohm
Implantable Lead Connection Status: 753985
Implantable Lead Connection Status: 753985
Implantable Lead Connection Status: 753985
Implantable Lead Implant Date: 20180524
Implantable Lead Implant Date: 20180524
Implantable Lead Implant Date: 20180524
Implantable Lead Location: 753858
Implantable Lead Location: 753859
Implantable Lead Location: 753860
Implantable Lead Model: 4398
Implantable Lead Model: 5076
Implantable Pulse Generator Implant Date: 20180524
Lead Channel Impedance Value: 189.525
Lead Channel Impedance Value: 193.707
Lead Channel Impedance Value: 199.5 Ohm
Lead Channel Impedance Value: 204.14 Ohm
Lead Channel Impedance Value: 204.14 Ohm
Lead Channel Impedance Value: 361 Ohm
Lead Channel Impedance Value: 399 Ohm
Lead Channel Impedance Value: 399 Ohm
Lead Channel Impedance Value: 418 Ohm
Lead Channel Impedance Value: 418 Ohm
Lead Channel Impedance Value: 475 Ohm
Lead Channel Impedance Value: 513 Ohm
Lead Channel Impedance Value: 532 Ohm
Lead Channel Impedance Value: 627 Ohm
Lead Channel Impedance Value: 665 Ohm
Lead Channel Impedance Value: 684 Ohm
Lead Channel Impedance Value: 722 Ohm
Lead Channel Impedance Value: 741 Ohm
Lead Channel Pacing Threshold Amplitude: 0.5 V
Lead Channel Pacing Threshold Amplitude: 0.5 V
Lead Channel Pacing Threshold Amplitude: 0.875 V
Lead Channel Pacing Threshold Pulse Width: 0.4 ms
Lead Channel Pacing Threshold Pulse Width: 0.4 ms
Lead Channel Pacing Threshold Pulse Width: 0.4 ms
Lead Channel Sensing Intrinsic Amplitude: 2.375 mV
Lead Channel Sensing Intrinsic Amplitude: 2.375 mV
Lead Channel Sensing Intrinsic Amplitude: 24.875 mV
Lead Channel Sensing Intrinsic Amplitude: 24.875 mV
Lead Channel Setting Pacing Amplitude: 1 V
Lead Channel Setting Pacing Amplitude: 1.5 V
Lead Channel Setting Pacing Amplitude: 2.5 V
Lead Channel Setting Pacing Pulse Width: 0.4 ms
Lead Channel Setting Pacing Pulse Width: 0.4 ms
Lead Channel Setting Sensing Sensitivity: 0.3 mV
Zone Setting Status: 755011
Zone Setting Status: 755011

## 2024-06-02 ENCOUNTER — Encounter

## 2024-06-12 ENCOUNTER — Encounter

## 2024-06-23 ENCOUNTER — Ambulatory Visit: Attending: Cardiovascular Disease

## 2024-06-23 DIAGNOSIS — Z95810 Presence of automatic (implantable) cardiac defibrillator: Secondary | ICD-10-CM

## 2024-06-23 DIAGNOSIS — I5022 Chronic systolic (congestive) heart failure: Secondary | ICD-10-CM

## 2024-06-23 NOTE — Progress Notes (Signed)
 EPIC Encounter for ICM Monitoring  Patient Name: Susan Peck is a 58 y.o. female Date: 06/23/2024 Primary Care Physican: Harvey Gaetana CROME, NP Primary Cardiologist: Verlin Electrophysiologist: Mealor Bi-V Pacing: 99.9%  09/07/2022 Office Weight: 200 lbs   10/20/2022 Weight: 200 lbs 11/24/2022 Weight: 193-195 lbs 10/12/2023 Office Weight: 198 lbs 02/27/2024 Office Weight: 196 lbs 06/23/2024 Weight: 202 lbs (192-197 baseline)   Since 01-Jun-2024 Time in AT/AF  0.0 hr/day (0.0%)   Battery ERI: 4 months   Spoke with patient and heart failure questions reviewed.  Transmission results reviewed.  Pt reports some swelling in her feet and weight gain after eating holiday foods.      Since 05/19/2024 ICM Remote Transmission: Optivol Thoracic impedance suggesting normal fluid levels with the exception of possible fluid level starting 06/17/2024.  Also suggesting possible fluid accumulation from 11/17-2025-06/16/2024.   Prescribed:  Furosemide  40 mg take 2 tablets (80 mg total) every morning and 1 tablet (40 mg total) every evening.  Pt self adjust Furosemide  when needed.    Labs: 10/30/2023 Creatinine 0.58, BUN 11, Potassium 4.4, Sodium 144, GFR 105 A complete set of results can be found in Results Review.   Recommendations:   She will adjust Lasix  over the next 2-3 days.  Advised to call if fluid symptoms persist.  Sent to Dr Verlin as RICK.   Follow-up plan: ICM clinic phone appointment on 08/04/2024.   91 day device clinic remote transmission 07/14/2024.     EP/Cardiology Office Visits:  Recall 02/27/2025 with Dr Nancey.   Recall 10/11/2024 with Dr Verlin.   Copy of ICM check sent to Dr. Nancey.     Remote monitoring is medically necessary for Heart Failure Management.    Daily Thoracic Impedance ICM trend: 03/24/2024 through 06/23/2024.    12-14 Month Thoracic Impedance ICM trend:     [name], RN 06/23/2024 10:37 AM

## 2024-06-30 ENCOUNTER — Encounter

## 2024-07-02 ENCOUNTER — Ambulatory Visit

## 2024-07-03 LAB — CUP PACEART REMOTE DEVICE CHECK
Battery Remaining Longevity: 4 mo
Battery Voltage: 2.8 V
Brady Statistic AP VP Percent: 0.17 %
Brady Statistic AP VS Percent: 0.01 %
Brady Statistic AS VP Percent: 99.77 %
Brady Statistic AS VS Percent: 0.05 %
Brady Statistic RA Percent Paced: 0.18 %
Brady Statistic RV Percent Paced: 99.91 %
Date Time Interrogation Session: 20251210012503
HighPow Impedance: 76 Ohm
Implantable Lead Connection Status: 753985
Implantable Lead Connection Status: 753985
Implantable Lead Connection Status: 753985
Implantable Lead Implant Date: 20180524
Implantable Lead Implant Date: 20180524
Implantable Lead Implant Date: 20180524
Implantable Lead Location: 753858
Implantable Lead Location: 753859
Implantable Lead Location: 753860
Implantable Lead Model: 4398
Implantable Lead Model: 5076
Implantable Pulse Generator Implant Date: 20180524
Lead Channel Impedance Value: 228 Ohm
Lead Channel Impedance Value: 228 Ohm
Lead Channel Impedance Value: 228 Ohm
Lead Channel Impedance Value: 228 Ohm
Lead Channel Impedance Value: 228 Ohm
Lead Channel Impedance Value: 456 Ohm
Lead Channel Impedance Value: 456 Ohm
Lead Channel Impedance Value: 456 Ohm
Lead Channel Impedance Value: 456 Ohm
Lead Channel Impedance Value: 456 Ohm
Lead Channel Impedance Value: 513 Ohm
Lead Channel Impedance Value: 570 Ohm
Lead Channel Impedance Value: 608 Ohm
Lead Channel Impedance Value: 741 Ohm
Lead Channel Impedance Value: 741 Ohm
Lead Channel Impedance Value: 779 Ohm
Lead Channel Impedance Value: 817 Ohm
Lead Channel Impedance Value: 817 Ohm
Lead Channel Pacing Threshold Amplitude: 0.5 V
Lead Channel Pacing Threshold Amplitude: 0.5 V
Lead Channel Pacing Threshold Amplitude: 0.875 V
Lead Channel Pacing Threshold Pulse Width: 0.4 ms
Lead Channel Pacing Threshold Pulse Width: 0.4 ms
Lead Channel Pacing Threshold Pulse Width: 0.4 ms
Lead Channel Sensing Intrinsic Amplitude: 20.75 mV
Lead Channel Sensing Intrinsic Amplitude: 20.75 mV
Lead Channel Sensing Intrinsic Amplitude: 3.375 mV
Lead Channel Sensing Intrinsic Amplitude: 3.375 mV
Lead Channel Setting Pacing Amplitude: 1 V
Lead Channel Setting Pacing Amplitude: 1.5 V
Lead Channel Setting Pacing Amplitude: 2.5 V
Lead Channel Setting Pacing Pulse Width: 0.4 ms
Lead Channel Setting Pacing Pulse Width: 0.4 ms
Lead Channel Setting Sensing Sensitivity: 0.3 mV
Zone Setting Status: 755011
Zone Setting Status: 755011

## 2024-07-09 NOTE — Progress Notes (Signed)
 Remote ICD Transmission

## 2024-07-11 ENCOUNTER — Ambulatory Visit: Payer: Self-pay | Admitting: Cardiovascular Disease

## 2024-07-14 ENCOUNTER — Encounter

## 2024-07-23 ENCOUNTER — Other Ambulatory Visit: Payer: Self-pay | Admitting: Obstetrics and Gynecology

## 2024-07-23 DIAGNOSIS — A6004 Herpesviral vulvovaginitis: Secondary | ICD-10-CM

## 2024-07-23 NOTE — Telephone Encounter (Signed)
 Pt's annual has been scheduled for 08/27/24.

## 2024-07-23 NOTE — Telephone Encounter (Signed)
 Med refill request: VALACYclovir  (VALTREX ) 500 MG tablet Disp: 90 tablets Refills:  3  Last AEX:  10/06/2021 Last OV: 05/18/2023 Next AEX:  Not yet scheduled *Front desk has been asked to contact Pt to schedule an annual visit.  Last MMG (if hormonal med):  N/A Refill authorized? Please Advise.

## 2024-07-31 ENCOUNTER — Encounter

## 2024-08-02 ENCOUNTER — Ambulatory Visit

## 2024-08-04 ENCOUNTER — Ambulatory Visit: Attending: Cardiovascular Disease

## 2024-08-04 DIAGNOSIS — Z95810 Presence of automatic (implantable) cardiac defibrillator: Secondary | ICD-10-CM

## 2024-08-04 DIAGNOSIS — I5022 Chronic systolic (congestive) heart failure: Secondary | ICD-10-CM | POA: Diagnosis not present

## 2024-08-04 NOTE — Progress Notes (Signed)
 EPIC Encounter for ICM Monitoring  Patient Name: Susan Peck is a 59 y.o. female Date: 08/04/2024 Primary Care Physican: Harvey Gaetana CROME, NP Primary Cardiologist: Verlin Electrophysiologist: Mealor Bi-V Pacing: 100%  09/07/2022 Office Weight: 200 lbs   10/20/2022 Weight: 200 lbs 11/24/2022 Weight: 193-195 lbs 10/12/2023 Office Weight: 198 lbs 02/27/2024 Office Weight: 196 lbs 06/23/2024 Weight: 202 lbs (192-197 baseline) 08/04/2024 Weight: 200 lbs   Clinical Status  Since 02-Aug-2024 Time in AT/AF  0.0 hr/day (0.0%)   Battery ERI: 3 months - discussed sound she will hear when time for battery replacement and there is 3 months left on battery after the sound is played.    Spoke with patient and heart failure questions reviewed.  Transmission results reviewed.  Pt asymptomatic for fluid accumulation.  Reports feeling well at this time and voices no complaints.     Since 06/23/2024 ICM Remote Transmission: Optivol Thoracic impedance suggesting normal fluid levels with the exception of possible fluid level starting 07/29/2024 and trending back to baseline.   Prescribed:  Furosemide  40 mg take 2 tablets (80 mg total) every morning and 1 tablet (40 mg total) every evening.  Pt self adjust Furosemide  when needed.    Labs: 10/30/2023 Creatinine 0.58, BUN 11, Potassium 4.4, Sodium 144, GFR 105 A complete set of results can be found in Results Review.   Recommendations:  Recommendation to limit salt intake to 2000 mg daily and fluid intake to 64 oz daily.  Encouraged to call if experiencing any fluid symptoms.    Follow-up plan: ICM clinic phone appointment on 09/04/2024.   91 day device clinic remote transmission 10/03/2024.     EP/Cardiology Office Visits:  Recall 02/27/2025 with Dr Nancey.   Recall 10/11/2024 with Dr Verlin.   Copy of ICM check sent to Dr. Nancey.      Remote monitoring is medically necessary for Heart Failure Management.    Daily Thoracic Impedance ICM trend: 05/06/2024  through 08/04/2024.    12-14 Month Thoracic Impedance ICM trend:     Susan GORMAN Garner, RN 08/04/2024 3:50 PM

## 2024-08-05 ENCOUNTER — Encounter: Payer: Self-pay | Admitting: Cardiovascular Disease

## 2024-08-05 LAB — CUP PACEART REMOTE DEVICE CHECK
Battery Remaining Longevity: 3 mo
Battery Voltage: 2.79 V
Brady Statistic AP VP Percent: 0.55 %
Brady Statistic AP VS Percent: 0.01 %
Brady Statistic AS VP Percent: 99.41 %
Brady Statistic AS VS Percent: 0.03 %
Brady Statistic RA Percent Paced: 0.56 %
Brady Statistic RV Percent Paced: 99.94 %
Date Time Interrogation Session: 20260110052724
HighPow Impedance: 70 Ohm
Implantable Lead Connection Status: 753985
Implantable Lead Connection Status: 753985
Implantable Lead Connection Status: 753985
Implantable Lead Implant Date: 20180524
Implantable Lead Implant Date: 20180524
Implantable Lead Implant Date: 20180524
Implantable Lead Location: 753858
Implantable Lead Location: 753859
Implantable Lead Location: 753860
Implantable Lead Model: 4398
Implantable Lead Model: 5076
Implantable Pulse Generator Implant Date: 20180524
Lead Channel Impedance Value: 189.525
Lead Channel Impedance Value: 189.525
Lead Channel Impedance Value: 199.5 Ohm
Lead Channel Impedance Value: 199.5 Ohm
Lead Channel Impedance Value: 199.5 Ohm
Lead Channel Impedance Value: 361 Ohm
Lead Channel Impedance Value: 399 Ohm
Lead Channel Impedance Value: 399 Ohm
Lead Channel Impedance Value: 399 Ohm
Lead Channel Impedance Value: 456 Ohm
Lead Channel Impedance Value: 475 Ohm
Lead Channel Impedance Value: 513 Ohm
Lead Channel Impedance Value: 608 Ohm
Lead Channel Impedance Value: 608 Ohm
Lead Channel Impedance Value: 665 Ohm
Lead Channel Impedance Value: 665 Ohm
Lead Channel Impedance Value: 684 Ohm
Lead Channel Impedance Value: 684 Ohm
Lead Channel Pacing Threshold Amplitude: 0.5 V
Lead Channel Pacing Threshold Amplitude: 0.625 V
Lead Channel Pacing Threshold Amplitude: 0.875 V
Lead Channel Pacing Threshold Pulse Width: 0.4 ms
Lead Channel Pacing Threshold Pulse Width: 0.4 ms
Lead Channel Pacing Threshold Pulse Width: 0.4 ms
Lead Channel Sensing Intrinsic Amplitude: 2.625 mV
Lead Channel Sensing Intrinsic Amplitude: 2.625 mV
Lead Channel Sensing Intrinsic Amplitude: 23 mV
Lead Channel Sensing Intrinsic Amplitude: 23 mV
Lead Channel Setting Pacing Amplitude: 1 V
Lead Channel Setting Pacing Amplitude: 1.5 V
Lead Channel Setting Pacing Amplitude: 2.5 V
Lead Channel Setting Pacing Pulse Width: 0.4 ms
Lead Channel Setting Pacing Pulse Width: 0.4 ms
Lead Channel Setting Sensing Sensitivity: 0.3 mV
Zone Setting Status: 755011
Zone Setting Status: 755011

## 2024-08-08 ENCOUNTER — Ambulatory Visit: Payer: Self-pay | Admitting: Cardiovascular Disease

## 2024-08-14 ENCOUNTER — Encounter

## 2024-08-21 NOTE — Progress Notes (Signed)
 31 day ICM Remote transmission canceled due to [facility] clinic is on hold until further notice.  91 day remote monitoring will continue per protocol.

## 2024-08-27 ENCOUNTER — Encounter: Payer: Self-pay | Admitting: Nurse Practitioner

## 2024-08-27 ENCOUNTER — Ambulatory Visit: Admitting: Nurse Practitioner

## 2024-08-27 VITALS — BP 144/82 | HR 75 | Ht 62.5 in | Wt 199.0 lb

## 2024-08-27 DIAGNOSIS — Z780 Asymptomatic menopausal state: Secondary | ICD-10-CM | POA: Diagnosis not present

## 2024-08-27 DIAGNOSIS — Z01419 Encounter for gynecological examination (general) (routine) without abnormal findings: Secondary | ICD-10-CM

## 2024-08-27 DIAGNOSIS — B009 Herpesviral infection, unspecified: Secondary | ICD-10-CM | POA: Diagnosis not present

## 2024-08-27 DIAGNOSIS — Z1331 Encounter for screening for depression: Secondary | ICD-10-CM | POA: Diagnosis not present

## 2024-08-27 DIAGNOSIS — Z9189 Other specified personal risk factors, not elsewhere classified: Secondary | ICD-10-CM | POA: Diagnosis not present

## 2024-08-27 DIAGNOSIS — Z13820 Encounter for screening for osteoporosis: Secondary | ICD-10-CM

## 2024-08-27 MED ORDER — VALACYCLOVIR HCL 500 MG PO TABS: 500.0000 mg | ORAL_TABLET | Freq: Every day | ORAL | 3 refills | Status: AC

## 2024-08-27 NOTE — Progress Notes (Signed)
 "  Susan Peck 1966/05/19 [id number]   History:  59 y.o. [id number] presents for breast and pelvic exam. Postmenopausal - no HRT. S/P 2013 [facility] BSO for chronic pelvic pain. Normal pap and mammogram history. History of T2DM, CHF, ICD, COPD, HSV-2, takes valtrex  daily.   Gynecologic History No LMP recorded. Patient has had a hysterectomy.   Contraception/Family planning: status post hysterectomy Sexually active: Yes  Health Maintenance Last Pap: 01/31/2011. Normal Last mammogram: 2025. Results were: Normal per patient Last colonoscopy: 2016. Results were: Normal, 10-year recall Last Dexa: 06/2019. Results were: Normal     08/27/2024    3:51 PM  Depression screen PHQ 2/9  Decreased Interest 0  Down, Depressed, Hopeless 0  PHQ - 2 Score 0     Past medical history, past surgical history, family history and social history were all reviewed and documented in the EPIC chart. Boyfriend. 3 children, 8 grandchildren. Mother deceased in her 30s from breast cancer.   ROS:  A ROS was performed and pertinent positives and negatives are included.  Exam:  Vitals:   08/27/24 1553  BP: (!) 144/82  Pulse: 75  SpO2: 98%  Weight: 199 lb (90.3 kg)  Height: 5' 2.5 (1.588 m)     Body mass index is 35.82 kg/m.  General appearance:  Normal Thyroid :  Right goiter Respiratory  Auscultation:  Clear without wheezing or rhonchi Cardiovascular  Auscultation:  Regular rate, without rubs, murmurs or gallops  Edema/varicosities:  Not grossly evident Abdominal  Soft,nontender, without masses, guarding or rebound.  Liver/spleen:  No organomegaly noted  Hernia:  None appreciated  Skin  Inspection:  Grossly normal   Breasts: Examined lying and sitting.   Right: Without masses, retractions, discharge or axillary adenopathy.   Left: Without masses, retractions, discharge or axillary adenopathy. Pelvic: External genitalia:  no lesions              Urethra:  normal appearing urethra with no masses,  tenderness or lesions              Bartholins and Skenes: normal                 Vagina: normal appearing vagina with normal color and discharge, no lesions              Cervix: absent Bimanual Exam:  Uterus: absent              Adnexa: no mass, fullness, tenderness              Rectovaginal: Deferred              Anus:  normal, no lesions  Susan Peck, CMA present as chaperone.    Assessment/Plan:  59 y.o. [id number] for breast and pelvic exam.   Encounter for breast and pelvic examination - Education provided on SBEs, importance of preventative screenings, current guidelines, high calcium  diet, regular exercise, and multivitamin daily. Labs with PCP.  Postmenopausal - Plan: DG Bone Density. no HRT. S/P 2013 TVH BSO for pelvic pain  HSV-2 (herpes simplex virus 2) infection - Plan: valACYclovir  (VALTREX ) 500 MG tablet daily.   Screening for cervical cancer - Normal Pap history.  No longer screening per guidelines.  Screening for breast cancer - Normal mammogram history. Continue annual screenings. Mother deceased in her 30s from breast cancer.  Normal breast exam today.  Screening for colon cancer - 2016 colonoscopy. Will repeat at GI's recommended interval.   Screening for osteoporosis - Plan: DG Bone Density.  Normal bone density in 2020. Continue Vitamin D + Calcium  and increase exercise.   Return in about 1 year (around 08/27/2025) for B&P (high risk).     Susan Peck [facility], 4:18 PM 08/27/2024  "

## 2024-09-01 ENCOUNTER — Encounter

## 2024-09-02 ENCOUNTER — Encounter

## 2024-09-04 ENCOUNTER — Ambulatory Visit

## 2024-09-15 ENCOUNTER — Encounter

## 2024-09-29 ENCOUNTER — Encounter

## 2024-10-03 ENCOUNTER — Encounter

## 2024-12-29 ENCOUNTER — Encounter

## 2025-06-29 ENCOUNTER — Encounter
